# Patient Record
Sex: Female | Born: 1935
Health system: Southern US, Community
[De-identification: ages and names within clinical notes are randomized; demographics above are authoritative.]

## PROBLEM LIST (undated history)

## (undated) DIAGNOSIS — K219 Gastro-esophageal reflux disease without esophagitis: Secondary | ICD-10-CM

## (undated) DIAGNOSIS — K5730 Diverticulosis of large intestine without perforation or abscess without bleeding: Secondary | ICD-10-CM

## (undated) DIAGNOSIS — I480 Paroxysmal atrial fibrillation: Secondary | ICD-10-CM

## (undated) DIAGNOSIS — W540XXA Bitten by dog, initial encounter: Secondary | ICD-10-CM

## (undated) DIAGNOSIS — K635 Polyp of colon: Secondary | ICD-10-CM

## (undated) DIAGNOSIS — M5136 Other intervertebral disc degeneration, lumbar region: Secondary | ICD-10-CM

## (undated) DIAGNOSIS — N289 Disorder of kidney and ureter, unspecified: Secondary | ICD-10-CM

## (undated) DIAGNOSIS — K589 Irritable bowel syndrome without diarrhea: Secondary | ICD-10-CM

## (undated) DIAGNOSIS — I071 Rheumatic tricuspid insufficiency: Secondary | ICD-10-CM

## (undated) DIAGNOSIS — M51369 Other intervertebral disc degeneration, lumbar region without mention of lumbar back pain or lower extremity pain: Secondary | ICD-10-CM

## (undated) DIAGNOSIS — F419 Anxiety disorder, unspecified: Secondary | ICD-10-CM

## (undated) DIAGNOSIS — E785 Hyperlipidemia, unspecified: Secondary | ICD-10-CM

## (undated) DIAGNOSIS — M353 Polymyalgia rheumatica: Secondary | ICD-10-CM

## (undated) DIAGNOSIS — K760 Fatty (change of) liver, not elsewhere classified: Secondary | ICD-10-CM

## (undated) DIAGNOSIS — I499 Cardiac arrhythmia, unspecified: Secondary | ICD-10-CM

## (undated) DIAGNOSIS — S32000A Wedge compression fracture of unspecified lumbar vertebra, initial encounter for closed fracture: Secondary | ICD-10-CM

## (undated) DIAGNOSIS — N329 Bladder disorder, unspecified: Secondary | ICD-10-CM

## (undated) DIAGNOSIS — R55 Syncope and collapse: Secondary | ICD-10-CM

## (undated) DIAGNOSIS — K648 Other hemorrhoids: Secondary | ICD-10-CM

## (undated) DIAGNOSIS — K5909 Other constipation: Secondary | ICD-10-CM

## (undated) DIAGNOSIS — I340 Nonrheumatic mitral (valve) insufficiency: Secondary | ICD-10-CM

## (undated) DIAGNOSIS — I4811 Longstanding persistent atrial fibrillation: Secondary | ICD-10-CM

## (undated) HISTORY — DX: Other constipation: K59.09

## (undated) HISTORY — PX: TONSILLECTOMY AND ADENOIDECTOMY: SHX28

## (undated) HISTORY — DX: Polyp of colon: K63.5

## (undated) HISTORY — DX: Gastro-esophageal reflux disease without esophagitis: K21.9

## (undated) HISTORY — DX: Longstanding persistent atrial fibrillation: I48.11

## (undated) HISTORY — DX: Diverticulosis of large intestine without perforation or abscess without bleeding: K57.30

## (undated) HISTORY — PX: BLADDER SURGERY: SHX569

## (undated) HISTORY — PX: ABDOMINAL HYSTERECTOMY: SHX81

## (undated) HISTORY — DX: Bladder disorder, unspecified: N32.9

## (undated) HISTORY — DX: Wedge compression fracture of unspecified lumbar vertebra, initial encounter for closed fracture: S32.000A

## (undated) HISTORY — DX: Other intervertebral disc degeneration, lumbar region without mention of lumbar back pain or lower extremity pain: M51.369

## (undated) HISTORY — DX: Polymyalgia rheumatica: M35.3

## (undated) HISTORY — DX: Rheumatic tricuspid insufficiency: I07.1

## (undated) HISTORY — DX: Fatty (change of) liver, not elsewhere classified: K76.0

## (undated) HISTORY — DX: Irritable bowel syndrome, unspecified: K58.9

## (undated) HISTORY — DX: Hyperlipidemia, unspecified: E78.5

## (undated) HISTORY — DX: Paroxysmal atrial fibrillation: I48.0

## (undated) HISTORY — DX: Other hemorrhoids: K64.8

## (undated) HISTORY — DX: Other intervertebral disc degeneration, lumbar region: M51.36

## (undated) HISTORY — DX: Syncope and collapse: R55

## (undated) HISTORY — PX: COLONOSCOPY: SHX174

## (undated) HISTORY — DX: Nonrheumatic mitral (valve) insufficiency: I34.0

## (undated) HISTORY — DX: Bitten by dog, initial encounter: W54.0XXA

---

## 1998-03-30 ENCOUNTER — Other Ambulatory Visit: Admission: RE | Admit: 1998-03-30 | Discharge: 1998-03-30 | Payer: Self-pay | Admitting: Obstetrics and Gynecology

## 1998-10-26 ENCOUNTER — Ambulatory Visit (HOSPITAL_COMMUNITY): Admission: RE | Admit: 1998-10-26 | Discharge: 1998-10-26 | Payer: Self-pay | Admitting: Internal Medicine

## 1998-10-26 ENCOUNTER — Encounter: Payer: Self-pay | Admitting: Internal Medicine

## 1998-12-12 ENCOUNTER — Other Ambulatory Visit: Admission: RE | Admit: 1998-12-12 | Discharge: 1998-12-12 | Payer: Self-pay | Admitting: Internal Medicine

## 1998-12-12 ENCOUNTER — Encounter (INDEPENDENT_AMBULATORY_CARE_PROVIDER_SITE_OTHER): Payer: Self-pay | Admitting: Specialist

## 1999-03-22 ENCOUNTER — Encounter: Admission: RE | Admit: 1999-03-22 | Discharge: 1999-03-22 | Payer: Self-pay | Admitting: Internal Medicine

## 1999-03-22 ENCOUNTER — Encounter: Payer: Self-pay | Admitting: Internal Medicine

## 1999-11-19 ENCOUNTER — Other Ambulatory Visit: Admission: RE | Admit: 1999-11-19 | Discharge: 1999-11-19 | Payer: Self-pay | Admitting: Internal Medicine

## 2000-01-13 ENCOUNTER — Emergency Department (HOSPITAL_COMMUNITY): Admission: EM | Admit: 2000-01-13 | Discharge: 2000-01-13 | Payer: Self-pay | Admitting: Emergency Medicine

## 2000-03-31 ENCOUNTER — Encounter: Payer: Self-pay | Admitting: Internal Medicine

## 2000-03-31 ENCOUNTER — Encounter: Admission: RE | Admit: 2000-03-31 | Discharge: 2000-03-31 | Payer: Self-pay | Admitting: Internal Medicine

## 2000-04-10 ENCOUNTER — Ambulatory Visit (HOSPITAL_COMMUNITY): Admission: RE | Admit: 2000-04-10 | Discharge: 2000-04-10 | Payer: Self-pay | Admitting: Internal Medicine

## 2000-04-10 ENCOUNTER — Encounter: Payer: Self-pay | Admitting: Internal Medicine

## 2001-03-19 ENCOUNTER — Encounter: Admission: RE | Admit: 2001-03-19 | Discharge: 2001-03-19 | Payer: Self-pay | Admitting: Internal Medicine

## 2001-03-19 ENCOUNTER — Encounter: Payer: Self-pay | Admitting: Internal Medicine

## 2002-01-14 ENCOUNTER — Other Ambulatory Visit: Admission: RE | Admit: 2002-01-14 | Discharge: 2002-01-14 | Payer: Self-pay | Admitting: Internal Medicine

## 2002-03-29 ENCOUNTER — Encounter: Payer: Self-pay | Admitting: Internal Medicine

## 2002-03-29 ENCOUNTER — Ambulatory Visit (HOSPITAL_COMMUNITY): Admission: RE | Admit: 2002-03-29 | Discharge: 2002-03-29 | Payer: Self-pay | Admitting: Internal Medicine

## 2003-01-15 ENCOUNTER — Encounter: Payer: Self-pay | Admitting: Emergency Medicine

## 2003-01-15 ENCOUNTER — Emergency Department (HOSPITAL_COMMUNITY): Admission: EM | Admit: 2003-01-15 | Discharge: 2003-01-15 | Payer: Self-pay | Admitting: Emergency Medicine

## 2003-01-24 ENCOUNTER — Encounter: Admission: RE | Admit: 2003-01-24 | Discharge: 2003-01-24 | Payer: Self-pay | Admitting: Internal Medicine

## 2003-01-24 ENCOUNTER — Encounter: Payer: Self-pay | Admitting: Internal Medicine

## 2003-03-31 ENCOUNTER — Ambulatory Visit (HOSPITAL_COMMUNITY): Admission: RE | Admit: 2003-03-31 | Discharge: 2003-03-31 | Payer: Self-pay | Admitting: Internal Medicine

## 2003-06-12 ENCOUNTER — Emergency Department (HOSPITAL_COMMUNITY): Admission: EM | Admit: 2003-06-12 | Discharge: 2003-06-12 | Payer: Self-pay

## 2004-02-08 ENCOUNTER — Ambulatory Visit: Payer: Self-pay | Admitting: Internal Medicine

## 2004-05-14 ENCOUNTER — Ambulatory Visit: Payer: Self-pay | Admitting: Internal Medicine

## 2004-05-16 ENCOUNTER — Ambulatory Visit (HOSPITAL_COMMUNITY): Admission: RE | Admit: 2004-05-16 | Discharge: 2004-05-16 | Payer: Self-pay | Admitting: Internal Medicine

## 2004-08-13 ENCOUNTER — Ambulatory Visit: Payer: Self-pay | Admitting: Internal Medicine

## 2004-10-05 ENCOUNTER — Ambulatory Visit: Payer: Self-pay | Admitting: Family Medicine

## 2004-11-25 ENCOUNTER — Emergency Department (HOSPITAL_COMMUNITY): Admission: EM | Admit: 2004-11-25 | Discharge: 2004-11-25 | Payer: Self-pay | Admitting: Family Medicine

## 2004-12-28 ENCOUNTER — Ambulatory Visit: Payer: Self-pay | Admitting: Internal Medicine

## 2005-03-01 ENCOUNTER — Ambulatory Visit: Payer: Self-pay | Admitting: Internal Medicine

## 2005-03-15 ENCOUNTER — Ambulatory Visit: Payer: Self-pay | Admitting: Internal Medicine

## 2005-04-29 ENCOUNTER — Ambulatory Visit: Payer: Self-pay | Admitting: Internal Medicine

## 2005-05-21 ENCOUNTER — Ambulatory Visit (HOSPITAL_COMMUNITY): Admission: RE | Admit: 2005-05-21 | Discharge: 2005-05-21 | Payer: Self-pay | Admitting: Internal Medicine

## 2005-05-31 ENCOUNTER — Ambulatory Visit: Payer: Self-pay | Admitting: Internal Medicine

## 2005-07-16 ENCOUNTER — Ambulatory Visit: Payer: Self-pay | Admitting: Internal Medicine

## 2006-02-06 ENCOUNTER — Ambulatory Visit: Payer: Self-pay | Admitting: Internal Medicine

## 2006-02-18 ENCOUNTER — Ambulatory Visit: Payer: Self-pay | Admitting: Internal Medicine

## 2006-05-06 ENCOUNTER — Ambulatory Visit: Payer: Self-pay | Admitting: Internal Medicine

## 2006-05-26 ENCOUNTER — Ambulatory Visit (HOSPITAL_COMMUNITY): Admission: RE | Admit: 2006-05-26 | Discharge: 2006-05-26 | Payer: Self-pay | Admitting: Internal Medicine

## 2006-10-31 ENCOUNTER — Encounter: Payer: Self-pay | Admitting: Internal Medicine

## 2007-04-22 ENCOUNTER — Ambulatory Visit: Payer: Self-pay | Admitting: Family Medicine

## 2007-04-22 ENCOUNTER — Encounter: Payer: Self-pay | Admitting: Internal Medicine

## 2007-05-29 ENCOUNTER — Ambulatory Visit (HOSPITAL_COMMUNITY): Admission: RE | Admit: 2007-05-29 | Discharge: 2007-05-29 | Payer: Self-pay | Admitting: Internal Medicine

## 2007-06-05 ENCOUNTER — Ambulatory Visit: Payer: Self-pay | Admitting: Internal Medicine

## 2007-06-05 DIAGNOSIS — M810 Age-related osteoporosis without current pathological fracture: Secondary | ICD-10-CM | POA: Insufficient documentation

## 2007-06-05 DIAGNOSIS — R1084 Generalized abdominal pain: Secondary | ICD-10-CM | POA: Insufficient documentation

## 2007-06-05 DIAGNOSIS — E785 Hyperlipidemia, unspecified: Secondary | ICD-10-CM | POA: Insufficient documentation

## 2007-06-05 DIAGNOSIS — R1903 Right lower quadrant abdominal swelling, mass and lump: Secondary | ICD-10-CM | POA: Insufficient documentation

## 2007-06-05 DIAGNOSIS — K589 Irritable bowel syndrome without diarrhea: Secondary | ICD-10-CM | POA: Insufficient documentation

## 2007-06-05 DIAGNOSIS — J309 Allergic rhinitis, unspecified: Secondary | ICD-10-CM

## 2007-06-05 DIAGNOSIS — M949 Disorder of cartilage, unspecified: Secondary | ICD-10-CM

## 2007-06-05 DIAGNOSIS — M899 Disorder of bone, unspecified: Secondary | ICD-10-CM | POA: Insufficient documentation

## 2007-06-05 DIAGNOSIS — F411 Generalized anxiety disorder: Secondary | ICD-10-CM

## 2007-06-05 LAB — CONVERTED CEMR LAB: Vit D, 1,25-Dihydroxy: 37 (ref 30–89)

## 2007-06-07 DIAGNOSIS — K5730 Diverticulosis of large intestine without perforation or abscess without bleeding: Secondary | ICD-10-CM | POA: Insufficient documentation

## 2007-06-12 LAB — CONVERTED CEMR LAB
ALT: 17 units/L (ref 0–35)
AST: 20 units/L (ref 0–37)
Albumin: 3.7 g/dL (ref 3.5–5.2)
Alkaline Phosphatase: 77 units/L (ref 39–117)
BUN: 11 mg/dL (ref 6–23)
Basophils Absolute: 0.1 10*3/uL (ref 0.0–0.1)
Basophils Relative: 0.9 % (ref 0.0–1.0)
Bilirubin, Direct: 0.2 mg/dL (ref 0.0–0.3)
CO2: 30 meq/L (ref 19–32)
Calcium: 9.3 mg/dL (ref 8.4–10.5)
Chloride: 108 meq/L (ref 96–112)
Cholesterol: 223 mg/dL (ref 0–200)
Creatinine, Ser: 0.8 mg/dL (ref 0.4–1.2)
Direct LDL: 136.1 mg/dL
Eosinophils Absolute: 0.1 10*3/uL (ref 0.0–0.6)
Eosinophils Relative: 1.9 % (ref 0.0–5.0)
GFR calc Af Amer: 91 mL/min
GFR calc non Af Amer: 75 mL/min
Glucose, Bld: 73 mg/dL (ref 70–99)
HCT: 39.9 % (ref 36.0–46.0)
HDL: 48.4 mg/dL (ref >39.0)
Hemoglobin: 13.4 g/dL (ref 12.0–15.0)
Lymphocytes Relative: 36.5 % (ref 12.0–46.0)
MCHC: 33.6 g/dL (ref 30.0–36.0)
MCV: 95.3 fL (ref 78.0–100.0)
Monocytes Absolute: 0.4 10*3/uL (ref 0.2–0.7)
Monocytes Relative: 7.3 % (ref 3.0–11.0)
Neutro Abs: 3 10*3/uL (ref 1.4–7.7)
Neutrophils Relative %: 53.4 % (ref 43.0–77.0)
Platelets: 265 10*3/uL (ref 150–400)
Potassium: 4.9 meq/L (ref 3.5–5.1)
RBC: 4.19 M/uL (ref 3.87–5.11)
RDW: 12.6 % (ref 11.5–14.6)
Sodium: 142 meq/L (ref 135–145)
TSH: 4.55 microintl units/mL (ref 0.35–5.50)
Total Bilirubin: 0.8 mg/dL (ref 0.3–1.2)
Total CHOL/HDL Ratio: 4.6
Total Protein: 6.7 g/dL (ref 6.0–8.3)
Triglycerides: 118 mg/dL (ref 0–149)
VLDL: 24 mg/dL (ref 0–40)
WBC: 5.6 10*3/uL (ref 4.5–10.5)

## 2007-06-18 ENCOUNTER — Encounter: Payer: Self-pay | Admitting: Internal Medicine

## 2007-06-23 LAB — CONVERTED CEMR LAB
Calcium, Total (PTH): 9.1 mg/dL (ref 8.4–10.5)
PTH: 64.4 pg/mL (ref 14.0–72.0)

## 2007-07-29 ENCOUNTER — Telehealth: Payer: Self-pay | Admitting: Internal Medicine

## 2007-12-31 ENCOUNTER — Ambulatory Visit: Payer: Self-pay | Admitting: Internal Medicine

## 2008-04-01 HISTORY — PX: CATARACT EXTRACTION: SUR2

## 2008-06-15 ENCOUNTER — Ambulatory Visit (HOSPITAL_COMMUNITY): Admission: RE | Admit: 2008-06-15 | Discharge: 2008-06-15 | Payer: Self-pay | Admitting: Internal Medicine

## 2008-07-11 ENCOUNTER — Encounter: Payer: Self-pay | Admitting: Internal Medicine

## 2008-07-14 ENCOUNTER — Ambulatory Visit: Payer: Self-pay | Admitting: Internal Medicine

## 2008-07-14 DIAGNOSIS — J069 Acute upper respiratory infection, unspecified: Secondary | ICD-10-CM | POA: Insufficient documentation

## 2008-07-18 LAB — CONVERTED CEMR LAB
ALT: 23 units/L (ref 0–35)
AST: 29 units/L (ref 0–37)
Albumin: 4.1 g/dL (ref 3.5–5.2)
Alkaline Phosphatase: 78 units/L (ref 39–117)
BUN: 14 mg/dL (ref 6–23)
Basophils Absolute: 0.1 10*3/uL (ref 0.0–0.1)
Basophils Relative: 0.9 % (ref 0.0–3.0)
Bilirubin, Direct: 0.1 mg/dL (ref 0.0–0.3)
CO2: 26 meq/L (ref 19–32)
Calcium: 9.7 mg/dL (ref 8.4–10.5)
Chloride: 102 meq/L (ref 96–112)
Cholesterol: 222 mg/dL — ABNORMAL HIGH (ref 0–200)
Creatinine, Ser: 0.8 mg/dL (ref 0.4–1.2)
Direct LDL: 144.3 mg/dL
Eosinophils Absolute: 0.1 10*3/uL (ref 0.0–0.7)
Eosinophils Relative: 1.7 % (ref 0.0–5.0)
GFR calc non Af Amer: 74.51 mL/min (ref >60)
Glucose, Bld: 78 mg/dL (ref 70–99)
HCT: 40.1 % (ref 36.0–46.0)
HDL: 47 mg/dL (ref >39.00)
Hemoglobin: 13.8 g/dL (ref 12.0–15.0)
Lymphocytes Relative: 36.8 % (ref 12.0–46.0)
Lymphs Abs: 2.6 10*3/uL (ref 0.7–4.0)
MCHC: 34.4 g/dL (ref 30.0–36.0)
MCV: 94.4 fL (ref 78.0–100.0)
Monocytes Absolute: 0.4 10*3/uL (ref 0.1–1.0)
Monocytes Relative: 5.9 % (ref 3.0–12.0)
Neutro Abs: 3.9 10*3/uL (ref 1.4–7.7)
Neutrophils Relative %: 54.7 % (ref 43.0–77.0)
Platelets: 233 10*3/uL (ref 150.0–400.0)
Potassium: 3.8 meq/L (ref 3.5–5.1)
RBC: 4.25 M/uL (ref 3.87–5.11)
RDW: 12 % (ref 11.5–14.6)
Sodium: 137 meq/L (ref 135–145)
TSH: 3.04 microintl units/mL (ref 0.35–5.50)
Total Bilirubin: 0.9 mg/dL (ref 0.3–1.2)
Total CHOL/HDL Ratio: 5
Total Protein: 7.6 g/dL (ref 6.0–8.3)
Triglycerides: 156 mg/dL — ABNORMAL HIGH (ref 0.0–149.0)
VLDL: 31.2 mg/dL (ref 0.0–40.0)
WBC: 7.1 10*3/uL (ref 4.5–10.5)

## 2008-08-12 ENCOUNTER — Ambulatory Visit: Payer: Self-pay | Admitting: Internal Medicine

## 2008-08-25 ENCOUNTER — Encounter: Admission: RE | Admit: 2008-08-25 | Discharge: 2008-09-28 | Payer: Self-pay | Admitting: Orthopedic Surgery

## 2008-12-12 ENCOUNTER — Encounter (INDEPENDENT_AMBULATORY_CARE_PROVIDER_SITE_OTHER): Payer: Self-pay | Admitting: *Deleted

## 2009-06-16 ENCOUNTER — Ambulatory Visit: Payer: Self-pay | Admitting: Internal Medicine

## 2009-06-16 DIAGNOSIS — R1013 Epigastric pain: Secondary | ICD-10-CM | POA: Insufficient documentation

## 2009-06-21 ENCOUNTER — Encounter: Payer: Self-pay | Admitting: Internal Medicine

## 2009-06-21 ENCOUNTER — Ambulatory Visit: Payer: Self-pay | Admitting: Internal Medicine

## 2009-06-26 ENCOUNTER — Encounter (INDEPENDENT_AMBULATORY_CARE_PROVIDER_SITE_OTHER): Payer: Self-pay | Admitting: *Deleted

## 2009-06-30 ENCOUNTER — Ambulatory Visit (HOSPITAL_COMMUNITY): Admission: RE | Admit: 2009-06-30 | Discharge: 2009-06-30 | Payer: Self-pay | Admitting: Internal Medicine

## 2009-07-11 ENCOUNTER — Ambulatory Visit: Payer: Self-pay | Admitting: Internal Medicine

## 2009-08-01 ENCOUNTER — Ambulatory Visit: Payer: Self-pay | Admitting: Internal Medicine

## 2009-08-01 DIAGNOSIS — R109 Unspecified abdominal pain: Secondary | ICD-10-CM

## 2009-08-01 DIAGNOSIS — N390 Urinary tract infection, site not specified: Secondary | ICD-10-CM

## 2009-08-01 LAB — CONVERTED CEMR LAB
Bilirubin Urine: NEGATIVE
Glucose, Urine, Semiquant: NEGATIVE
Ketones, urine, test strip: NEGATIVE
Nitrite: NEGATIVE
Protein, U semiquant: NEGATIVE
Specific Gravity, Urine: <1.005
Urobilinogen, UA: 0.2
pH: 6

## 2009-08-02 ENCOUNTER — Encounter: Payer: Self-pay | Admitting: Internal Medicine

## 2009-08-02 ENCOUNTER — Telehealth: Payer: Self-pay | Admitting: Internal Medicine

## 2009-08-03 ENCOUNTER — Encounter: Payer: Self-pay | Admitting: Internal Medicine

## 2009-08-08 ENCOUNTER — Encounter (INDEPENDENT_AMBULATORY_CARE_PROVIDER_SITE_OTHER): Payer: Self-pay | Admitting: *Deleted

## 2009-08-10 ENCOUNTER — Ambulatory Visit: Payer: Self-pay | Admitting: Internal Medicine

## 2009-08-10 LAB — CONVERTED CEMR LAB
Calcium, Total (PTH): 9.1 mg/dL (ref 8.4–10.5)
PTH: 49.2 pg/mL (ref 14.0–72.0)

## 2009-08-11 ENCOUNTER — Telehealth: Payer: Self-pay | Admitting: Internal Medicine

## 2009-08-15 LAB — CONVERTED CEMR LAB
ALT: 23 units/L (ref 0–35)
AST: 26 units/L (ref 0–37)
Albumin: 3.9 g/dL (ref 3.5–5.2)
Alkaline Phosphatase: 65 units/L (ref 39–117)
BUN: 13 mg/dL (ref 6–23)
Basophils Absolute: 0 10*3/uL (ref 0.0–0.1)
Basophils Relative: 1 % (ref 0.0–3.0)
Bilirubin, Direct: 0.1 mg/dL (ref 0.0–0.3)
CO2: 28 meq/L (ref 19–32)
Calcium: 9.2 mg/dL (ref 8.4–10.5)
Chloride: 104 meq/L (ref 96–112)
Cholesterol: 187 mg/dL (ref 0–200)
Creatinine, Ser: 0.8 mg/dL (ref 0.4–1.2)
Direct LDL: 103.5 mg/dL
Eosinophils Absolute: 0.1 10*3/uL (ref 0.0–0.7)
Eosinophils Relative: 2.8 % (ref 0.0–5.0)
GFR calc non Af Amer: 74.3 mL/min (ref >60)
Glucose, Bld: 80 mg/dL (ref 70–99)
HCT: 35.8 % — ABNORMAL LOW (ref 36.0–46.0)
HDL: 44.4 mg/dL (ref >39.00)
Hemoglobin: 12.4 g/dL (ref 12.0–15.0)
Lymphocytes Relative: 42.4 % (ref 12.0–46.0)
Lymphs Abs: 2.2 10*3/uL (ref 0.7–4.0)
MCHC: 34.5 g/dL (ref 30.0–36.0)
MCV: 92.4 fL (ref 78.0–100.0)
Monocytes Absolute: 0.4 10*3/uL (ref 0.1–1.0)
Monocytes Relative: 7.4 % (ref 3.0–12.0)
Neutro Abs: 2.4 10*3/uL (ref 1.4–7.7)
Neutrophils Relative %: 46.4 % (ref 43.0–77.0)
Platelets: 268 10*3/uL (ref 150.0–400.0)
Potassium: 5.4 meq/L — ABNORMAL HIGH (ref 3.5–5.1)
RBC: 3.88 M/uL (ref 3.87–5.11)
RDW: 14 % (ref 11.5–14.6)
Sodium: 139 meq/L (ref 135–145)
TSH: 7.04 microintl units/mL — ABNORMAL HIGH (ref 0.35–5.50)
Total Bilirubin: 0.6 mg/dL (ref 0.3–1.2)
Total CHOL/HDL Ratio: 4
Total Protein: 6.7 g/dL (ref 6.0–8.3)
Triglycerides: 218 mg/dL — ABNORMAL HIGH (ref 0.0–149.0)
VLDL: 43.6 mg/dL — ABNORMAL HIGH (ref 0.0–40.0)
WBC: 5.1 10*3/uL (ref 4.5–10.5)

## 2009-08-17 ENCOUNTER — Encounter: Payer: Self-pay | Admitting: Internal Medicine

## 2009-08-24 ENCOUNTER — Ambulatory Visit: Payer: Self-pay | Admitting: Internal Medicine

## 2009-08-24 LAB — HM COLONOSCOPY

## 2009-08-29 ENCOUNTER — Ambulatory Visit: Payer: Self-pay | Admitting: Internal Medicine

## 2009-08-29 LAB — CONVERTED CEMR LAB
Free T4: 0.8 ng/dL (ref 0.6–1.6)
Potassium: 4.1 meq/L (ref 3.5–5.1)
T3, Free: 2.9 pg/mL (ref 2.3–4.2)
TSH: 4.39 microintl units/mL (ref 0.35–5.50)
Thyroperoxidase Ab SerPl-aCnc: 44.4 (ref 0.0–60.0)

## 2009-09-05 ENCOUNTER — Ambulatory Visit: Payer: Self-pay | Admitting: Internal Medicine

## 2009-09-05 DIAGNOSIS — R21 Rash and other nonspecific skin eruption: Secondary | ICD-10-CM | POA: Insufficient documentation

## 2009-11-23 ENCOUNTER — Encounter: Payer: Self-pay | Admitting: Internal Medicine

## 2010-01-04 ENCOUNTER — Encounter: Payer: Self-pay | Admitting: Internal Medicine

## 2010-01-08 ENCOUNTER — Inpatient Hospital Stay (HOSPITAL_COMMUNITY): Admission: EM | Admit: 2010-01-08 | Discharge: 2010-01-10 | Payer: Self-pay | Admitting: Emergency Medicine

## 2010-01-09 ENCOUNTER — Encounter: Payer: Self-pay | Admitting: Internal Medicine

## 2010-01-10 ENCOUNTER — Encounter: Payer: Self-pay | Admitting: Internal Medicine

## 2010-01-15 ENCOUNTER — Ambulatory Visit: Payer: Self-pay | Admitting: Internal Medicine

## 2010-01-15 DIAGNOSIS — K5909 Other constipation: Secondary | ICD-10-CM | POA: Insufficient documentation

## 2010-01-15 DIAGNOSIS — E871 Hypo-osmolality and hyponatremia: Secondary | ICD-10-CM

## 2010-01-15 DIAGNOSIS — R399 Unspecified symptoms and signs involving the genitourinary system: Secondary | ICD-10-CM

## 2010-01-15 DIAGNOSIS — K5902 Outlet dysfunction constipation: Secondary | ICD-10-CM | POA: Insufficient documentation

## 2010-01-17 ENCOUNTER — Encounter: Payer: Self-pay | Admitting: Internal Medicine

## 2010-01-18 LAB — CONVERTED CEMR LAB
BUN: 9 mg/dL (ref 6–23)
CO2: 24 meq/L (ref 19–32)
Calcium: 9.4 mg/dL (ref 8.4–10.5)
Chloride: 99 meq/L (ref 96–112)
Creatinine, Ser: 0.7 mg/dL (ref 0.4–1.2)
GFR calc non Af Amer: 81.2 mL/min (ref >60)
Glucose, Bld: 73 mg/dL (ref 70–99)
Potassium: 5.1 meq/L (ref 3.5–5.1)
Sodium: 134 meq/L — ABNORMAL LOW (ref 135–145)
TSH: 4.93 microintl units/mL (ref 0.35–5.50)

## 2010-01-26 ENCOUNTER — Telehealth: Payer: Self-pay | Admitting: *Deleted

## 2010-02-09 ENCOUNTER — Encounter: Payer: Self-pay | Admitting: Internal Medicine

## 2010-04-11 ENCOUNTER — Encounter: Payer: Self-pay | Admitting: Internal Medicine

## 2010-05-01 NOTE — Miscellaneous (Signed)
Summary: BONE DENSITY  Clinical Lists Changes  Orders: Added new Test order of T-Bone Densitometry [phone]) - Signed Added new Test order of T-Lumbar Vertebral Assessment [phone]) - Signed

## 2010-05-01 NOTE — Consult Note (Signed)
Summary: [facility]  [facility]   Imported By: Laural Benes 08/14/2009 14:49:10  _____________________________________________________________________  External Attachment:    Type:   Image     Comment:   External Document

## 2010-05-01 NOTE — Letter (Signed)
Summary: [facility]  [facility]   Imported By: Laural Benes 02/19/2010 11:29:33  _____________________________________________________________________  External Attachment:    Type:   Image     Comment:   External Document

## 2010-05-01 NOTE — Assessment & Plan Note (Signed)
Summary: follow up/ssc   Vital Signs:  Patient profile:   77 year old female Menstrual status:  hysterectomy Weight:      164 pounds Pulse rate:   80 / minute BP sitting:   130 / 80  (left arm) Cuff size:   regular  Vitals Entered By: Sherron Monday, CMA (AAMA) (September 05, 2009 1:35 PM) CC: Follow-up visit on labs- Pt needs a place on left side of neck check. It is itching.    History of Present Illness: Ashley Lawson comes in today  for follow up of labs and problem urinating and abd pain and consitpation.  ALso to discuss bone health.  She did see urology and no other dx and her urin problem is better today.   Has itchy place left neck Hard to see asks opinion.  Was on bisphosphonates a while back   she cant remember why she had to stop  but thinks it was a se.   Preventive Screening-Counseling & Management  Alcohol-Tobacco     Alcohol drinks/day: <1     Alcohol type: wine     Smoking Status: never     Passive Smoke Exposure: no  Caffeine-Diet-Exercise     Caffeine use/day: 0     Does Patient Exercise: yes     Type of exercise: walking and pool in summer     Times/week: 3  Current Medications (verified): 1)  Alprazolam 0.25 Mg Tabs (Alprazolam) .... Take 1 Tablet Once Daily As Needed 2)  Fish Oil 500 Mg Caps (Omega-3 Fatty Acids) .... Take 3)  Calcium 1200-1000 Mg-Unit Chew (Calcium Carbonate-Vit D-Min) 4)  Super Softget Multi 5)  Ranitidine Hcl 150 Mg Tabs (Ranitidine Hcl) .Marland Kitchen.. 1 By Mouth Two Times A Day As Directed  Allergies (verified): 1)  ! Fosamax 2)  ! * Achromycin 3)  Amoxicillin (Amoxicillin)  Past History:  Past medical, surgical, family and social histories (including risk factors) reviewed, and no changes noted (except as noted below).  Past Medical History: Reviewed history from 07/11/2009 and no changes required. Allergic rhinitis Hyperlipidemia Osteoporosis dexa 2011  -2.7 hip nl spine  IBS  constipation predominant Diverticulosis,  colon  Past Surgical History: Reviewed history from 06/16/2009 and no changes required. Hysterectomy Cataract extraction   2010. both   Past History:  Care Management: Allergy: Dr. Velora Heckler  or whelan  GI  Elie Confer: Mccuwen  Family History: Reviewed history from 06/05/2007 and no changes required. sister with fx hip and mom hx fx hip.   Social History: Reviewed history from 07/14/2008 and no changes required. Retired Never Smoked Alcohol use-yes Regular exercise-yes widowed     at home with chidlren and GKs  hhof  2   puppy     Physical Exam  General:  alert, well-developed, and well-nourished.   Skin:  left neck with patch of redness like contact derm no viscle or suspicious lesions Cervical Nodes:  No lymphadenopathy noted Psych:  Oriented X3, good eye contact, not depressed appearing, and slightly anxious.   reviewed  data  and labs nl thyroid and PTH  Impression & Recommendations:  Problem # 1:  OSTEOPOROSIS (ICD-733.00) counseled at length  . Believe she had se of the bisphoshponates oraly with her GI  problems  and gerd symptom .  she is hestant to have meds that could give se understandibally but does have risk by her dexa and age .  she is a candidate for reclast.  Her updated medication list for this  problem includes:    Calcium 1200-1000 Mg-unit Chew (Calcium carbonate-vit d-min)  Problem # 2:  RASH (ICD-782.1) seems like contact and not al;arming ... disc rx for this  Problem # 3:  SUPRAPUBIC PAIN (ICD-789.09) Assessment: Improved eval by uro quiescent currently  Complete Medication List: 1)  Alprazolam 0.25 Mg Tabs (Alprazolam) .... Take 1 tablet once daily as needed 2)  Fish Oil 500 Mg Caps (Omega-3 fatty acids) .... Take 3)  Calcium 1200-1000 Mg-unit Chew (Calcium carbonate-vit d-min) 4)  Super Softget Multi  5)  Ranitidine Hcl 150 Mg Tabs (Ranitidine hcl) .Marland Kitchen.. 1 by mouth two times a day as directed  Patient Instructions: 1)  consider  reclast    infusion for your osteoporosis to have less change of stomach problems  with medications. 2)  Should get   TSHs in 6 months  it was normal this time .  3)  HCS cream to rash two times a day .   greater than 50% of visit spent in counseling   25 minutes

## 2010-05-01 NOTE — Letter (Signed)
Summary: [facility] Instructions  [facility]  [place], [place] [address]   Phone: [phone]  Fax: [phone]       Ashley Lawson    Nov 25, 1947    MRN: [id number]       Procedure Day Ashley Lawson:  Ashley Lawson  08/24/09     Arrival Time:  8:30am     Procedure Time:  9:30am     Location of Procedure:                    [facility]  [name] (4th Floor)    [name]  Starting 5 days prior to your procedure  SATURDAY 05/21  do not eat nuts, seeds, popcorn, corn, beans, peas,  salads, or any raw vegetables.  Do not take any fiber supplements (e.g. Metamucil, Citrucel, and Benefiber). ____________________________________________________________________________________________________   THE DAY BEFORE YOUR PROCEDURE         DATE: [facility]  05/25  1   Drink clear liquids the entire day-NO SOLID FOOD  2   Do not drink anything colored red or purple.  Avoid juices with pulp.  No orange juice.  3   Drink at least 64 oz. (8 glasses) of fluid/clear liquids during the day to prevent dehydration and help the prep work efficiently.  CLEAR LIQUIDS INCLUDE: Water Jello Ice Popsicles Tea (sugar ok, no milk/cream) Powdered fruit flavored drinks Coffee (sugar ok, no milk/cream) Gatorade Juice: apple, white grape, white cranberry  Lemonade Clear bullion, consomm, broth Carbonated beverages (any kind) Strained chicken noodle soup Hard Candy  4   Mix the entire bottle of Miralax with 64 oz. of Gatorade/Powerade in the morning and put in the refrigerator to chill.  5   At 3:00 pm take 2 Dulcolax/Bisacodyl tablets.  6   At 4:30 pm take one Reglan/Metoclopramide tablet.  7  Starting at 5:00 pm drink one 8 oz glass of the Miralax mixture every 15-20 minutes until you have finished drinking the entire 64 oz.  You should finish drinking prep around 7:30 or 8:00 pm.  8   If you are nauseated, you may take the 2nd  Reglan/Metoclopramide tablet at 6:30 pm.        9    At 8:00 pm take 2 more DULCOLAX/Bisacodyl tablets.     THE DAY OF YOUR PROCEDURE      DATE:  Ashley Lawson  05/26  You may drink clear liquids until  7:30am   (2 HOURS BEFORE PROCEDURE).   MEDICATION INSTRUCTIONS  Unless otherwise instructed, you should take regular prescription medications with a small sip of water as early as possible the morning of your procedure.           OTHER INSTRUCTIONS  You will need a responsible adult at least 75 years of age to accompany you and drive you home.   This person must remain in the waiting room during your procedure.  Wear loose fitting clothing that is easily removed.  Leave jewelry and other valuables at home.  However, you may wish to bring a book to read or an iPod/MP3 player to listen to music as you wait for your procedure to start.  Remove all body piercing jewelry and leave at home.  Total time from sign-in until discharge is approximately 2-3 hours.  You should go home directly after your procedure and rest.  You can resume normal activities the day after your procedure.  The day of your procedure you  should not:   Drive   Make legal decisions   Operate machinery   Drink alcohol   Return to work  You will receive specific instructions about eating, activities and medications before you leave.   The above instructions have been reviewed and explained to me by   Ashley Dash RN  Aug 10, 2009 11:20 AM     I fully understand and can verbalize these instructions _____________________________ Date _______

## 2010-05-01 NOTE — Letter (Signed)
Summary: Previsit letter  [facility]  [place], [place] [address]   Phone: [phone]  Fax: [phone]       06/26/2009 MRN: [id number]  [facility] [place], [place]  [address]  Dear Ms. Sacra,  Welcome to the Gastroenterology Division at [facility].    You are scheduled to see a nurse for your pre-procedure visit on 08-10-09 at 11:00a.m. on the 3rd floor at [facility] [employer].  We ask that you try to arrive at our office 15 minutes prior to your appointment time to allow for check-in.  Your nurse visit will consist of discussing your medical and surgical history, your immediate family medical history, and your medications.    Please bring a complete list of all your medications or, if you prefer, bring the medication bottles and we will list them.  We will need to be aware of both prescribed and over the counter drugs.  We will need to know exact dosage information as well.  If you are on blood thinners (Coumadin, Plavix, Aggrenox, Ticlid, etc.) please call our office today/prior to your appointment, as we need to consult with your physician about holding your medication.   Please be prepared to read and sign documents such as consent forms, a financial agreement, and acknowledgement forms.  If necessary, and with your consent, a friend or relative is welcome to sit-in on the nurse visit with you.  Please bring your insurance card so that we may make a copy of it.  If your insurance requires a referral to see a specialist, please bring your referral form from your primary care physician.  No co-pay is required for this nurse visit.     If you cannot keep your appointment, please call [phone] to cancel or reschedule prior to your appointment date.  This allows [place] the opportunity to schedule an appointment for another patient in need of care.    Thank you for choosing [facility] for your medical  needs.  We appreciate the opportunity to care for you.  Please visit [place] at our website  to learn more about our practice.                     Sincerely.                                                                                                                   The Gastroenterology Division

## 2010-05-01 NOTE — Progress Notes (Signed)
Summary: ? UTI ?  Phone Note Call from Patient   Caller: Patient Call For: Burnis Medin MD Summary of Call: Pt is having ? symptoms of a UTI.  Wants to wait and go to the Saturday clinic tomorrow if any worse.  Her symptoms are very mild and are described as a sensitivity when urinating.  She  sees an Dealer, and has appt Thursday for routine follow up. Initial call taken by: Deanna Artis CMA,  Aug 11, 2009 2:52 PM  Follow-up for Phone Call        satuday clinic is appropiate. Follow-up by: Burnis Medin MD,  Aug 11, 2009 3:38 PM

## 2010-05-01 NOTE — Letter (Signed)
Summary: [facility]  [facility]   Imported By: Laural Benes 01/11/2010 10:04:20  _____________________________________________________________________  External Attachment:    Type:   Image     Comment:   External Document

## 2010-05-01 NOTE — Assessment & Plan Note (Signed)
Summary: GASTROINTESTINAL CONCERNS // RS   Vital Signs:  Patient profile:   77 year old female Menstrual status:  hysterectomy Weight:      165 pounds Temp:     98 degrees F Pulse rate:   97 / minute Resp:     12 per minute BP sitting:   116 / 64  Vitals Entered By: Deanna Artis CMA (June 16, 2009 2:25 PM) CC: epigastric pain at times Is Patient Diabetic? No Pain Assessment Patient in pain? no        History of Present Illness: Ashley Lawson comes in today for   above. last office visit was last year  MAY  Since last visit  here  there have been no major changes in health status  .   However she is having increasing GI problems recently episodic  but generally feels well otherwise.  GI signs seem to be the same as in the past.     ? from onions recently and gets  epigastric discomfortt and feels like going to come up.     worse recently . No vomiting.  But ? regurgitation.     Currently not taking anything for this . In the past has been on h2 blocker.   "Bowels  are doing better " with strawberries and fiber and prune juice less constipation. IBS stable and no divertic flares.  Anxiety  : not taking much alprazalam but almost out.  Bone health : no  new fractures     Preventive Screening-Counseling & Management  Alcohol-Tobacco     Alcohol drinks/day: <1     Alcohol type: wine     Smoking Status: never     Passive Smoke Exposure: no  Caffeine-Diet-Exercise     Caffeine use/day: 0     Does Patient Exercise: yes     Type of exercise: walking and pool in summer     Times/week: 3  Allergies: 1)  ! Fosamax 2)  Amoxicillin (Amoxicillin)  Past History:  Past medical, surgical, family and social histories (including risk factors) reviewed, and no changes noted (except as noted below).  Past Medical History: Allergic rhinitis Hyperlipidemia Osteoporosis IBS  constipation predominant Diverticulosis, colon  Past Surgical History: Hysterectomy Cataract  extraction   2010. both   Past History:  Care Management: Allergy: Dr. Velora Heckler  or whelan  GI  Elie Confer: Mccuwen  Family History: Reviewed history from 06/05/2007 and no changes required. sister with fx hip and mom hx fx hip.   Social History: Reviewed history from 07/14/2008 and no changes required. Retired Never Smoked Alcohol use-yes Regular exercise-yes widowed     at home with chidlren and GKs  hhof  2   puppy     Review of Systems  The patient denies anorexia, fever, weight loss, vision loss, decreased hearing, hoarseness, chest pain, syncope, dyspnea on exertion, peripheral edema, prolonged cough, headaches, hemoptysis, melena, hematochezia, hematuria, transient blindness, difficulty walking, depression, abnormal bleeding, enlarged lymph nodes, angioedema, and breast masses.    Physical Exam  General:  alert, well-developed, well-nourished, and well-hydrated.   Head:  normocephalic and atraumatic.   Eyes:  vision grossly intact.   Nose:  no external deformity, no external erythema, and no nasal discharge.   Mouth:  pharynx pink and moist.   Neck:  No deformities, masses, or tenderness noted.no bruits heard Lungs:  Normal respiratory effort, chest expands symmetrically. Lungs are clear to auscultation, no crackles or wheezes.no dullness.    Heart:  Normal rate  and regular rhythm. S1 and S2 normal without gallop, murmur, click, rub or other extra sounds.no lifts.   Abdomen:  soft, normal bowel sounds, no distention, no masses, no guarding, no rigidity, no rebound tenderness, no hepatomegaly, and no splenomegaly.  tender around mid epigastrum  no masses  Msk:  OA changes hands no acute swelling Pulses:  pulses intact without delay   Neurologic:  alert & oriented X3, strength normal in all extremities, and gait normal.   Skin:  turgor normal and color normal.  senile changes  Cervical Nodes:  No lymphadenopathy noted Psych:  Oriented X3, normally interactive, good  eye contact, and not depressed appearing.  minimally anxious  today   Impression & Recommendations:  Problem # 1:  ABDOMINAL PAIN, EPIGASTRIC (ICD-789.06)  with hx of same and what sounds like regurgitation  ? not vomiting    .  she related this to her eating certain foods and ok inbetween  and since did well on ranititdine in the past will restart.  and follow up if not getting better   due for labs  also that can be schedules  and follow up with GI if needed  Orders: Prescription Created Electronically [phone])  Problem # 2:  ANXIETY STATE, UNSPECIFIED (ICD-300.00) Assessment: Improved refill med to use as needed 1/2 or so Her updated medication list for this problem includes:    Alprazolam 0.25 Mg Tabs (Alprazolam) .Marland Kitchen... Take 1 tablet once daily as needed  Problem # 3:  OSTEOPOROSIS (ICD-733.00) no fracture. ( except old ankle fx)   ( coulnt take fosamax)  and will review record   lasat dexa 2009  hip -2.7   and had rx to fosamax ( GI)   want interested after that for further meds .   will readdress at future visit  and could repeat DEXa this year  Her updated medication list for this problem includes:    Calcium 1200-1000 Mg-unit Chew (Calcium carbonate-vit d-min)  Problem # 4:  HYPERLIPIDEMIA (ICD-272.4) recheck labs   Complete Medication List: 1)  Alprazolam 0.25 Mg Tabs (Alprazolam) .... Take 1 tablet once daily as needed 2)  Fish Oil 500 Mg Caps (Omega-3 fatty acids) .... Take 3)  Calcium 1200-1000 Mg-unit Chew (Calcium carbonate-vit d-min) 4)  Super Softget Multi  5)  Restasis 0.05 % Emul (Cyclosporine) 6)  Ranitidine Hcl 150 Mg Tabs (Ranitidine hcl) .Marland Kitchen.. 1 by mouth two times a day as directed  Patient Instructions: 1)  begin acid blocker  for at least   3-4 weeks   2)  then     can wean off after this.    3)  avoid further weight gain and 5- 10 pound weight loss will help also. 4)  In one month   5)  Lab fasting lipid lfts cbcdiff  bmp tsh  dx abd pain and elevated  lipids  6)  then follow up as appropriate.  7)  to get colonoscopy in  this year  per Dr Olevia Perches. Prescriptions: ALPRAZOLAM 0.25 MG TABS (ALPRAZOLAM) Take 1 tablet once daily as needed  #30 x 1   Entered and Authorized by:   Burnis Medin MD   Signed by:   Burnis Medin MD on 06/16/2009   Method used:   Print then Give to Patient   RxID:   [id number] RANITIDINE HCL 150 MG TABS (RANITIDINE HCL) 1 by mouth two times a day as directed  #60 x 3   Entered and Authorized by:   Mariann Laster  Darnelle Going MD   Signed by:   Burnis Medin MD on 06/16/2009   Method used:   Electronically to        [id number] (retail)       [address]       [place], [place]  [address]       Ph: [phone] or [phone]       Fax: [phone]   RxID:   [id number]   Appended Document: GASTROINTESTINAL CONCERNS // RS Tell patient that I reviewed her record and she is due for the DEXA  scan. Please arrange for her to get this done.  Appended Document: GASTROINTESTINAL CONCERNS // RS LMTOCB  Appended Document: GASTROINTESTINAL CONCERNS // RS Pt aware of results and dexa scheduled.

## 2010-05-01 NOTE — Miscellaneous (Signed)
Summary: [facility]  Clinical Lists Changes  Medications: Added new medication of MIRALAX   POWD (POLYETHYLENE GLYCOL 3350) As per prep  instructions. - Signed Added new medication of DULCOLAX 5 MG  TBEC (BISACODYL) Day before procedure take 2 at 3pm and 2 at 8pm. - Signed Added new medication of REGLAN 10 MG  TABS (METOCLOPRAMIDE HCL) As per prep instructions. - Signed Rx of MIRALAX   POWD (POLYETHYLENE GLYCOL 3350) As per prep  instructions.;  #255gm x 0;  Signed;  Entered by: [name] RN;  Authorized by: Lafayette Dragon MD;  Method used: Electronically to [id number] [address], [place], [place]  [address], Ph: [phone] or [phone], Fax: [phone] Rx of DULCOLAX 5 MG  TBEC (BISACODYL) Day before procedure take 2 at 3pm and 2 at 8pm.;  #4 x 0;  Signed;  Entered by: [name] RN;  Authorized by: Lafayette Dragon MD;  Method used: Electronically to [id number] [address] [place], [place]  [address], Ph: [phone] or [phone], Fax: [phone] Rx of REGLAN 10 MG  TABS (METOCLOPRAMIDE HCL) As per prep instructions.;  #2 x 0;  Signed;  Entered by: [name] RN;  Authorized by: Lafayette Dragon MD;  Method used: Electronically to [id number] [address] [place], [place]  [address], Ph: [phone] or [phone], Fax: [phone] Allergies: Added new allergy or adverse reaction of * ACHROMYCIN    Prescriptions: REGLAN 10 MG  TABS (METOCLOPRAMIDE HCL) As per prep instructions.  #2 x 0   Entered by:   [name] RN   Authorized by:   Lafayette Dragon MD   Signed by:   [name] RN on 08/10/2009   Method used:   Electronically to        [id number] (retail)       [address]       [place], [place]  [address]       Ph: [phone] or [phone]       Fax: [phone]   RxID:   [id number] DULCOLAX 5 MG  TBEC (BISACODYL) Day before procedure take 2 at 3pm and 2 at 8pm.  #4 x 0   Entered by:   [name] RN   Authorized by:   Lafayette Dragon MD  Signed by:   [name] RN on 08/10/2009   Method used:   Electronically to        [id number] (retail)       [address]       [place], [place]  [address]       Ph: [phone] or [phone]       Fax: [phone]   RxID:   [id number] MIRALAX   POWD (POLYETHYLENE GLYCOL 3350) As per prep  instructions.  #255gm x 0   Entered by:   [name] RN   Authorized by:   Lafayette Dragon MD   Signed by:   [name] RN on 08/10/2009   Method used:   Electronically to        [id number] (retail)       [address]       [place], [place]  [address]       Ph: [phone] or [phone]       Fax: [phone]   RxID:   [id number]

## 2010-05-01 NOTE — Letter (Signed)
Summary: [facility]  [facility]   Imported By: Laural Benes 01/24/2010 11:27:40  _____________________________________________________________________  External Attachment:    Type:   Image     Comment:   External Document

## 2010-05-01 NOTE — Letter (Signed)
Summary: [facility]  [facility]   Imported By: Laural Benes 01/24/2010 10:21:43  _____________________________________________________________________  External Attachment:    Type:   Image     Comment:   External Document

## 2010-05-01 NOTE — Letter (Signed)
Summary: [facility]  [facility]   Imported By: Laural Benes 08/25/2009 15:24:55  _____________________________________________________________________  External Attachment:    Type:   Image     Comment:   External Document

## 2010-05-01 NOTE — Assessment & Plan Note (Signed)
Summary: ?uti/njr   Vital Signs:  Patient profile:   77 year old female Menstrual status:  hysterectomy Weight:      164 pounds Temp:     98.4 degrees F oral Pulse rate:   78 / minute BP sitting:   130 / 80  (right arm) Cuff size:   regular  Vitals Entered By: Sherron Monday, CMA (AAMA) (Aug 01, 2009 3:37 PM) CC: Lower abd. pressure, no burning upon urination, pt has to push hard to get urine out. This started today. Some urinary frequency the other night.   History of Present Illness: Ashley Lawson   has suprapubic pain.    dysuria  but pressure over bladder.     hard to empty. bladder for a day.off and on signs in the past.  NO fever  change in bowel habits.  or flank pain.  ? if a UTI  feels like this.  No meds for this .  Remote hx for urology consults.  Preventive Screening-Counseling & Management  Alcohol-Tobacco     Alcohol drinks/day: <1     Alcohol type: wine     Smoking Status: never     Passive Smoke Exposure: no  Caffeine-Diet-Exercise     Caffeine use/day: 0     Does Patient Exercise: yes     Type of exercise: walking and pool in summer     Times/week: 3  Current Medications (verified): 1)  Alprazolam 0.25 Mg Tabs (Alprazolam) .... Take 1 Tablet Once Daily As Needed 2)  Fish Oil 500 Mg Caps (Omega-3 Fatty Acids) .... Take 3)  Calcium 1200-1000 Mg-Unit Chew (Calcium Carbonate-Vit D-Min) 4)  Super Softget Multi 5)  Ranitidine Hcl 150 Mg Tabs (Ranitidine Hcl) .Marland Kitchen.. 1 By Mouth Two Times A Day As Directed  Allergies (verified): 1)  ! Fosamax 2)  Amoxicillin (Amoxicillin)  Past History:  Past medical, surgical, family and social histories (including risk factors) reviewed for relevance to current acute and chronic problems.  Past Medical History: Reviewed history from 07/11/2009 and no changes required. Allergic rhinitis Hyperlipidemia Osteoporosis dexa 2011  -2.7 hip nl spine  IBS  constipation predominant Diverticulosis, colon  Past Surgical  History: Reviewed history from 06/16/2009 and no changes required. Hysterectomy Cataract extraction   2010. both   Past History:  Care Management: Allergy: Dr. Velora Heckler  or whelan  GI  Elie Confer: Mccuwen  Family History: Reviewed history from 06/05/2007 and no changes required. sister with fx hip and mom hx fx hip.   Social History: Reviewed history from 07/14/2008 and no changes required. Retired Never Smoked Alcohol use-yes Regular exercise-yes widowed     at home with chidlren and GKs  hhof  2   puppy     Review of Systems  The patient denies anorexia, fever, weight loss, weight gain, vision loss, melena, hematochezia, severe indigestion/heartburn, hematuria, and genital sores.    Physical Exam  General:  Well-developed,well-nourished,in no acute distress; alert,appropriate and cooperative throughout examination Lungs:  normal respiratory effort, no intercostal retractions, and no accessory muscle use.   Heart:  normal rate and regular rhythm.   Abdomen:  soft, no distention, no masses, no guarding, no rebound tenderness, no hepatomegaly, and no splenomegaly.  tender lower abd suprapubic without G or R   no flank pain Pulses:  pulses intact without delay   Psych:  Oriented X3, normally interactive, good eye contact, and not depressed appearing.     Impression & Recommendations:  Problem # 1:  ? UTI  culture and rx empirically  and follow up  some atypical signs  ? bladder dysfunction   will get uro check if persistent or  progressive    Problem # 2:  SUPRAPUBIC PAIN (ICD-789.09) see above   Complete Medication List: 1)  Alprazolam 0.25 Mg Tabs (Alprazolam) .... Take 1 tablet once daily as needed 2)  Fish Oil 500 Mg Caps (Omega-3 fatty acids) .... Take 3)  Calcium 1200-1000 Mg-unit Chew (Calcium carbonate-vit d-min) 4)  Super Softget Multi  5)  Ranitidine Hcl 150 Mg Tabs (Ranitidine hcl) .Marland Kitchen.. 1 by mouth two times a day as directed 6)  Sulfamethoxazole-tmp  Ds 800-160 Mg Tabs (Sulfamethoxazole-trimethoprim) .Marland Kitchen.. 1 by mouth two times a day  for bladder infection  Other Orders: UA Dipstick w/o Micro (automated)  (81003) T-Culture, Urine ([id number]) Prescription Created Electronically ([id number])  Patient Instructions: 1)  take the antibioitic     for possible bladder infection. 2)  You will be informed of culture  results when available.  Prescriptions: SULFAMETHOXAZOLE-TMP DS 800-160 MG TABS (SULFAMETHOXAZOLE-TRIMETHOPRIM) 1 by mouth two times a day  for bladder infection  #10 x 0   Entered and Authorized by:   Burnis Medin MD   Signed by:   Burnis Medin MD on 08/01/2009   Method used:   Electronically to        [id number] (retail)       [address]       [place], [place]  [address]       Ph: [phone] or [phone]       Fax: [phone]   RxID:   [id number]   Laboratory Results   Urine Tests  Date/Time Recieved: Aug 01, 2009 3:25 PM  Date/Time Reported: Aug 01, 2009 3:25 PM   Routine Urinalysis   Color: yellow Appearance: Clear Glucose: negative   (Normal Range: Negative) Bilirubin: negative   (Normal Range: Negative) Ketone: negative   (Normal Range: Negative) Spec. Gravity: <1.005   (Normal Range: 1.003-1.035) Blood: 2+   (Normal Range: Negative) pH: 6.0   (Normal Range: 5.0-8.0) Protein: negative   (Normal Range: Negative) Urobilinogen: 0.2   (Normal Range: 0-1) Nitrite: negative   (Normal Range: Negative) Leukocyte Esterace: 1+   (Normal Range: Negative)    Comments: Doy Hutching, CMA  Aug 01, 2009 3:25 PM

## 2010-05-01 NOTE — Assessment & Plan Note (Signed)
Summary: POST HOSP F/U.Marland KitchenSLM   Vital Signs:  Patient profile:   77 year old female Menstrual status:  hysterectomy Weight:      166 pounds Pulse rate:   60 / minute BP sitting:   120 / 80  (left arm) Cuff size:   regular  Vitals Entered By: Sherron Monday, CMA Deborra Medina) (January 15, 2010 9:43 AM) CC: [facility] Follow up   History of Present Illness: Ashley Lawson  comes in today  for follow up of  of hopital evaluation for abdominal pain  . She was admitted on thesurgery service and found not to have a sugical problem Per urology she  saw  PA about 10 days ago and and placed oncipro and did culture  and then got worse and  went to hospital  October 10 and 11th. has  follow up appt October  31th .   Since  discharge    called urology again and was given vesicare42m  over the weekend.  Hurting some again  but     unsure if emptying . correctly and fearful pain will come back like before had some bleeding from hemorrhoid  .  Chronic constipation  fiber and prune juice  no change.   Preventive Screening-Counseling & Management  Alcohol-Tobacco     Alcohol drinks/day: <1     Alcohol type: wine     Smoking Status: never     Passive Smoke Exposure: no  Caffeine-Diet-Exercise     Caffeine use/day: 0     Does Patient Exercise: yes     Type of exercise: walking and pool in summer     Times/week: 3  Current Medications (verified): 1)  Alprazolam 0.25 Mg Tabs (Alprazolam) .... Take 1 Tablet Once Daily As Needed 2)  Fish Oil 500 Mg Caps (Omega-3 Fatty Acids) .... Take 3)  Calcium 1200-1000 Mg-Unit Chew (Calcium Carbonate-Vit D-Min) 4)  Super Softget Multi 5)  Ranitidine Hcl 150 Mg Tabs (Ranitidine Hcl) ..Marland Kitchen. 1 By Mouth Two Times A Day As Directed 6)  Ciprofloxacin Hcl 500 Mg Tabs (Ciprofloxacin Hcl) ..Marland Kitchen. 1 By Mouth Two Times A Day 7)  Vesicare 10 Mg Tabs (Solifenacin Succinate) ..Marland Kitchen. 1 By Mouth Once Daily  Allergies (verified): 1)  ! Fosamax 2)  ! * Achromycin 3)  Amoxicillin  (Amoxicillin)  Past History:  Past medical, surgical, family and social histories (including risk factors) reviewed for relevance to current acute and chronic problems.  Past Medical History: Allergic rhinitis Hyperlipidemia Osteoporosis dexa 2011  -2.7 hip nl spine  IBS  constipation predominant Diverticulosis, colon October 2011 abd pain  hospitalized   Past Surgical History: Reviewed history from 06/16/2009 and no changes required. Hysterectomy Cataract extraction   2010. both   Past History:  Care Management: Allergy: Dr. LVelora Heckler or whelan  GI  BElie ConferBrayton ElUrology: WJeffie Pollock Family History: Reviewed history from 06/05/2007 and no changes required. sister with fx hip and mom hx fx hip.   Social History: Reviewed history from 07/14/2008 and no changes required. Retired Never Smoked Alcohol use-yes Regular exercise-yes widowed     at home with chidlren and GKs  hhof  2   puppy     Review of Systems  The patient denies anorexia, fever, weight loss, weight gain, vision loss, decreased hearing, prolonged cough, melena, hematochezia, severe indigestion/heartburn, hematuria, muscle weakness, difficulty walking, enlarged lymph nodes, and angioedema.    Physical Exam  General:  Well-developed,well-nourished,in no acute distress; alert,appropriate and cooperative throughout examination Head:  normocephalic and atraumatic.   Neck:  No deformities, masses, or tenderness noted. Lungs:  normal respiratory effort and no intercostal retractions.   Heart:  normal rate and regular rhythm.   Abdomen:  soft, normal bowel sounds, no distention, no hepatomegaly, and no splenomegaly.   mild tenderness suprapubic area and left lower quadrandt but no obv mass effect  Pulses:  pulses intact without delay   Extremities:  no clubbing cyanosis or edema  Neurologic:  non focal grossly Cervical Nodes:  No lymphadenopathy noted Psych:  Oriented X3, memory intact for recent and  remote, good eye contact, and slightly anxious.   Hospital evaluation obtained  and reviewed   note ct scan nl  .  Impression & Recommendations:  Problem # 1:  BLADDER DYSFUNCTION (ICD-596.59) abd pain   felt better after foley and now increasing again  difficulty with bladder emptying    not acute today but agree may need to be seen earlier tha 2 weeks form now by urologist . Hx of bladder tack  no narcotics or decongewstants adding to problems  Orders: Specimen Handling (99000) Venipuncture (09295) TLB-TSH (Thyroid Stimulating Hormone) (84443-TSH)  Problem # 2:  CONSTIPATION, CHRONIC (ICD-564.09)  no change MOM and fiber etc.   Orders: Specimen Handling (99000) Venipuncture (74734) TLB-TSH (Thyroid Stimulating Hormone) (84443-TSH)  Problem # 3:  HYPONATREMIA, MILD (ICD-276.1) [place] of hosp records and no obv cause of such   will repeat today .  Orders: Specimen Handling (99000) Venipuncture (03709) TLB-BMP (Basic Metabolic Panel-BMET) (64383-KFMMCRF) TLB-TSH (Thyroid Stimulating Hormone) (84443-TSH)  Complete Medication List: 1)  Alprazolam 0.25 Mg Tabs (Alprazolam) .... Take 1 tablet once daily as needed 2)  Fish Oil 500 Mg Caps (Omega-3 fatty acids) .... Take 3)  Calcium 1200-1000 Mg-unit Chew (Calcium carbonate-vit d-min) 4)  Super Softget Multi  5)  Ranitidine Hcl 150 Mg Tabs (Ranitidine hcl) .Marland Kitchen.. 1 by mouth two times a day as directed 6)  Ciprofloxacin Hcl 500 Mg Tabs (Ciprofloxacin hcl) .Marland Kitchen.. 1 by mouth two times a day 7)  Vesicare 10 Mg Tabs (Solifenacin succinate) .Marland Kitchen.. 1 by mouth once daily  Patient Instructions: 1)  You will be informed of lab results when available.  2)  continue the vesicare and  if getting worse  again then call us and the urologist  to be seen earlier.    Orders Added: 1)  Specimen Handling [99000] 2)  Venipuncture [54360] 3)  TLB-BMP (Basic Metabolic Panel-BMET) [67703-EKBTCYE] 4)  TLB-TSH (Thyroid Stimulating Hormone) [84443-TSH] 5)   Est. Patient Level IV [18590]

## 2010-05-01 NOTE — Progress Notes (Signed)
Summary: things she forgot yesterday & her bladder problem  Phone Note Call from Patient Call back at Home Phone [phone]   Summary of Call: 1)Forgot to mention yesterday that she's a nervous wreck, has a wedding Sat that she doesn't want to miss.  Requesting something mild for her nerves.  May have Alprazolam on hand, not sure she can find it.   Remembers before 1/2 tab helped her enough that she could sleep.  Should she use it or something else?  2)Could you get her into [facility] for the bladder problem yet this week?  She says this is part of why she's nervous.   [facility].  Allergic to Pcn, achromycin. Initial call taken by: Shelbie Hutching, RN,  Aug 02, 2009 12:24 PM  Follow-up for Phone Call        Per Dr. Regis Bill- okay to refill the xanax. Can get her in with Urology but it probably won't be this week. Give the antibiotic sometime to get in her system.  Follow-up by: Sherron Monday, CMA Deborra Medina),  Aug 02, 2009 2:01 PM  Additional Follow-up for Phone Call Additional follow up Details #1::        Pt aware and already has some xanax. She found them. She did call the Urologist and has an appt with them tomorrow at 3:30pm with the PA. Pt aware that we will fax over the ov notes. Additional Follow-up by: Sherron Monday, CMA (AAMA),  Aug 02, 2009 4:56 PM

## 2010-05-01 NOTE — Assessment & Plan Note (Signed)
Summary: follow up on dexa/ssc   Vital Signs:  Patient profile:   77 year old female Menstrual status:  hysterectomy Height:      60.25 inches Weight:      164 pounds BMI:     31.88 Pulse rate:   88 / minute BP sitting:   120 / 80  (left arm) Cuff size:   regular  Vitals Entered By: Sherron Monday, CMA Deborra Medina) (July 11, 2009 2:59 PM) CC: Follow-up visit on dexa   History of Present Illness: Lira Cobaugh comesin today for   for above reasons .  To review her dexa scan . See Last ov     in the past Was on fosamax very short term  less than  a year because she  had gi se.  Since then no new fractures .  No fam hx of hip facture but mom had hip surgery replacements.  Preventive Screening-Counseling & Management  Alcohol-Tobacco     Alcohol drinks/day: <1     Alcohol type: wine     Smoking Status: never     Passive Smoke Exposure: no  Caffeine-Diet-Exercise     Caffeine use/day: 0     Does Patient Exercise: yes     Type of exercise: walking and pool in summer     Times/week: 3  Current Medications (verified): 1)  Alprazolam 0.25 Mg Tabs (Alprazolam) .... Take 1 Tablet Once Daily As Needed 2)  Fish Oil 500 Mg Caps (Omega-3 Fatty Acids) .... Take 3)  Calcium 1200-1000 Mg-Unit Chew (Calcium Carbonate-Vit D-Min) 4)  Super Softget Multi 5)  Ranitidine Hcl 150 Mg Tabs (Ranitidine Hcl) .Marland Kitchen.. 1 By Mouth Two Times A Day As Directed  Allergies (verified): 1)  ! Fosamax 2)  Amoxicillin (Amoxicillin)  Past History:  Past medical, surgical, family and social histories (including risk factors) reviewed for relevance to current acute and chronic problems.  Past Medical History: Allergic rhinitis Hyperlipidemia Osteoporosis dexa 2011  -2.7 hip nl spine  IBS  constipation predominant Diverticulosis, colon  Past Surgical History: Reviewed history from 06/16/2009 and no changes required. Hysterectomy Cataract extraction   2010. both   Past History:  Care  Management: Allergy: Dr. Velora Heckler  or whelan  GI  Elie Confer: Mccuwen  Family History: Reviewed history from 06/05/2007 and no changes required. sister with fx hip and mom hx fx hip.   Social History: Reviewed history from 07/14/2008 and no changes required. Retired Never Smoked Alcohol use-yes Regular exercise-yes widowed     at home with chidlren and GKs  hhof  2   puppy     Physical Exam  General:  Well-developed,well-nourished,in no acute distress; alert,appropriate and cooperative throughout examination Psych:  Oriented X3, good eye contact, not depressed appearing, and slightly anxious.   look well  and not distressed    Impression & Recommendations:  Problem # 1:  OSTEOPOROSIS (ICD-733.00) see dexa   had se of the fosmax when tried in the past.   she seems to be a candidate for reclast   but this can be done after the wedding and Gi eval.   will add labs to her  next labs . Her updated medication list for this problem includes:    Calcium 1200-1000 Mg-unit Chew (Calcium carbonate-vit d-min)  Complete Medication List: 1)  Alprazolam 0.25 Mg Tabs (Alprazolam) .... Take 1 tablet once daily as needed 2)  Fish Oil 500 Mg Caps (Omega-3 fatty acids) .... Take 3)  Calcium 1200-1000 Mg-unit Chew (Calcium  carbonate-vit d-min) 4)  Super Softget Multi  5)  Ranitidine Hcl 150 Mg Tabs (Ranitidine hcl) .Marland Kitchen.. 1 by mouth two times a day as directed  Patient Instructions: 1)  plan get Vit  D to labs as well as pth  we will add this on to your  labs  2)  dx osteoporosis. 3)  Call when after all you obligations medical and wedding  and 4)  then we can  set up infusion for   osteopoosis medicine ( reclast)  greater than 50% of visit spent in counseling  20 minutes

## 2010-05-01 NOTE — Progress Notes (Signed)
Summary: refill on alprazolam  Phone Note From Pharmacy   Caller: [phone] Reason for Call: Needs renewal Details for Reason: alprazolam 0.64m Summary of Call: last filled on 07/01/09 #30 Initial call taken by: SSherron Monday CMin[place](AAMA),  January 26, 2010 8:11 AM  Follow-up for Phone Call        ok x 1  Follow-up by: WBurnis MedinMD,  January 26, 2010 11:13 AM  Additional Follow-up for Phone Call Additional follow up Details #1::        Rx faxed to pharmacy Additional Follow-up by: SSherron Monday CMA (Deborra Medina,  January 26, 2010 11:15 AM    Prescriptions: ALPRAZOLAM 0.25 MG TABS (ALPRAZOLAM) Take 1 tablet once daily as needed  #30 x 0   Entered by:   SSherron Monday CMA (AAMA)   Authorized by:   WBurnis MedinMD   Signed by:   SSherron Monday CMA (AAMA) on 01/26/2010   Method used:   Handwritten   RxID::   [id number]

## 2010-05-01 NOTE — Letter (Signed)
Summary: [facility]  [facility]   Imported By: Laural Benes 12/01/2009 11:06:24  _____________________________________________________________________  External Attachment:    Type:   Image     Comment:   External Document

## 2010-05-01 NOTE — Procedures (Signed)
Summary: Colonoscopy  Patient: Terasa Orsini Note: All result statuses are Final unless otherwise noted.  Tests: (1) Colonoscopy (COL)   COL Colonoscopy           [facility]     [place], [place]  [address]           COLONOSCOPY PROCEDURE REPORT           PATIENT:  Ashley Lawson, Hast  MR#:  [id number]     BIRTHDATE:  Mar 06, 1933, 77 yrs. old  GENDER:  female     ENDOSCOPIST:  Lowella Bandy. Olevia Perches, MD     REF. BY:  Standley Brooking. Panosh, M.D.     PROCEDURE DATE:  08/24/2009     PROCEDURE:  Colonoscopy 36629     ASA CLASS:  Class II     INDICATIONS:  hx of hyperplastic polyp 2000,     brother with colon cancer     last colon 2005     MEDICATIONS:   Versed 10 mg, Fentanyl 75 mcg           DESCRIPTION OF PROCEDURE:   After the risks benefits and     alternatives of the procedure were thoroughly explained, informed     consent was obtained.  Digital rectal exam was performed and     revealed no rectal masses.   The LB [id number] endoscope     was introduced through the anus and advanced to the cecum, which     was identified by both the appendix and ileocecal valve, without     limitations.  The quality of the prep was good, using MiraLax.     The instrument was then slowly withdrawn as the colon was fully     examined.     <<PROCEDUREIMAGES>>           FINDINGS:  Mild diverticulosis was found in the sigmoid colon (see     image5 and image6).  This was otherwise a normal examination of     the colon (see image7, image4, image3, image2, and image1).     Internal hemorrhoids were found (see image8).   Retroflexed views     in the rectum revealed no abnormalities.    The scope was then     withdrawn from the patient and the procedure completed.           COMPLICATIONS:  None     ENDOSCOPIC IMPRESSION:     1) Mild diverticulosis in the sigmoid colon     2) Otherwise normal examination     3) Internal hemorrhoids     RECOMMENDATIONS:     1) high fiber  diet     REPEAT EXAM:  In 5 year(s) for.  may not need a recall colon due     to age           ______________________________     Lowella Bandy. Olevia Perches, MD           CC:           n.     eSIGNED:   Lowella Bandy. Norlan Rann at 08/24/2009 10:11 AM           [place], [place], [phone]  Note: An exclamation mark (!) indicates a result that was not dispersed into the flowsheet. Document Creation Date: 08/24/2009 10:12 AM _______________________________________________________________________  (1) Order result status: Final Collection or observation date-time: 08/24/2009 10:04 Requested date-time:  Receipt date-time:  Reported date-time:  Referring Physician:   Ordering Physician: [name] [id number]) Specimen Source:  Source: Tawanna Cooler Order Number: [id number] Lab site:   Appended Document: Colonoscopy    Clinical Lists Changes  Observations: Added new observation of COLONNXTDUE: 07/2014 (08/24/2009 10:49)

## 2010-05-03 NOTE — Letter (Signed)
Summary: [facility]  [facility]   Imported By: Laural Benes 04/27/2010 13:45:58  _____________________________________________________________________  External Attachment:    Type:   Image     Comment:   External Document

## 2010-06-13 LAB — COMPREHENSIVE METABOLIC PANEL
ALT: 26 U/L (ref 0–35)
AST: 33 U/L (ref 0–37)
Albumin: 3.9 g/dL (ref 3.5–5.2)
Alkaline Phosphatase: 64 U/L (ref 39–117)
BUN: 10 mg/dL (ref 6–23)
CO2: 22 mEq/L (ref 19–32)
Calcium: 9.1 mg/dL (ref 8.4–10.5)
Chloride: 94 mEq/L — ABNORMAL LOW (ref 96–112)
Creatinine, Ser: 0.84 mg/dL (ref 0.4–1.2)
GFR calc Af Amer: >60 mL/min (ref >60)
GFR calc non Af Amer: >60 mL/min (ref >60)
Glucose, Bld: 91 mg/dL (ref 70–99)
Potassium: 4 mEq/L (ref 3.5–5.1)
Sodium: 125 mEq/L — ABNORMAL LOW (ref 135–145)
Total Bilirubin: 0.6 mg/dL (ref 0.3–1.2)
Total Protein: 6.9 g/dL (ref 6.0–8.3)

## 2010-06-13 LAB — URINALYSIS, ROUTINE W REFLEX MICROSCOPIC
Bilirubin Urine: NEGATIVE
Glucose, UA: NEGATIVE mg/dL
Ketones, ur: NEGATIVE mg/dL
Leukocytes, UA: NEGATIVE
Nitrite: NEGATIVE
Protein, ur: NEGATIVE mg/dL
Specific Gravity, Urine: 1.007 (ref 1.005–1.030)
Urobilinogen, UA: 0.2 mg/dL (ref 0.0–1.0)
pH: 7 (ref 5.0–8.0)

## 2010-06-13 LAB — DIFFERENTIAL
Basophils Absolute: 0 10*3/uL (ref 0.0–0.1)
Basophils Relative: 0 % (ref 0–1)
Basophils Relative: 0 % (ref 0–1)
Eosinophils Absolute: 0 10*3/uL (ref 0.0–0.7)
Eosinophils Absolute: 0.1 10*3/uL (ref 0.0–0.7)
Eosinophils Relative: 0 % (ref 0–5)
Lymphocytes Relative: 27 % (ref 12–46)
Lymphs Abs: 2.7 10*3/uL (ref 0.7–4.0)
Monocytes Absolute: 0.7 10*3/uL (ref 0.1–1.0)
Monocytes Relative: 7 % (ref 3–12)
Monocytes Relative: 9 % (ref 3–12)
Neutro Abs: 6.4 10*3/uL (ref 1.7–7.7)
Neutrophils Relative %: 64 % (ref 43–77)
Neutrophils Relative %: 65 % (ref 43–77)

## 2010-06-13 LAB — BASIC METABOLIC PANEL
BUN: 8 mg/dL (ref 6–23)
CO2: 26 mEq/L (ref 19–32)
Calcium: 8.4 mg/dL (ref 8.4–10.5)
Chloride: 101 mEq/L (ref 96–112)
Creatinine, Ser: 0.73 mg/dL (ref 0.4–1.2)
GFR calc Af Amer: >60 mL/min (ref >60)
GFR calc non Af Amer: >60 mL/min (ref >60)
Glucose, Bld: 94 mg/dL (ref 70–99)
Potassium: 3.9 mEq/L (ref 3.5–5.1)
Sodium: 133 mEq/L — ABNORMAL LOW (ref 135–145)

## 2010-06-13 LAB — CBC
HCT: 34.3 % — ABNORMAL LOW (ref 36.0–46.0)
HCT: 38 % (ref 36.0–46.0)
Hemoglobin: 12 g/dL (ref 12.0–15.0)
Hemoglobin: 13.5 g/dL (ref 12.0–15.0)
MCH: 32.3 pg (ref 26.0–34.0)
MCH: 32.3 pg (ref 26.0–34.0)
MCHC: 34.8 g/dL (ref 30.0–36.0)
MCHC: 35.5 g/dL (ref 30.0–36.0)
MCV: 91.1 fL (ref 78.0–100.0)
MCV: 92.5 fL (ref 78.0–100.0)
Platelets: 236 10*3/uL (ref 150–400)
Platelets: 255 10*3/uL (ref 150–400)
RBC: 3.71 MIL/uL — ABNORMAL LOW (ref 3.87–5.11)
RBC: 4.17 MIL/uL (ref 3.87–5.11)
RDW: 12.9 % (ref 11.5–15.5)
RDW: 13 % (ref 11.5–15.5)
WBC: 7.3 10*3/uL (ref 4.0–10.5)
WBC: 9.8 10*3/uL (ref 4.0–10.5)

## 2010-06-13 LAB — URINE CULTURE
Colony Count: NO GROWTH
Culture  Setup Time: 201110110145
Culture: NO GROWTH

## 2010-06-13 LAB — URINE MICROSCOPIC-ADD ON

## 2010-07-16 ENCOUNTER — Other Ambulatory Visit: Payer: Self-pay | Admitting: Internal Medicine

## 2010-07-16 DIAGNOSIS — Z1231 Encounter for screening mammogram for malignant neoplasm of breast: Secondary | ICD-10-CM

## 2010-07-20 ENCOUNTER — Ambulatory Visit (HOSPITAL_COMMUNITY)
Admission: RE | Admit: 2010-07-20 | Discharge: 2010-07-20 | Disposition: A | Discharge status: Home / Self Care | Payer: Medicare Other | Source: Ambulatory Visit | Attending: Internal Medicine | Admitting: Internal Medicine

## 2010-07-20 DIAGNOSIS — Z1231 Encounter for screening mammogram for malignant neoplasm of breast: Secondary | ICD-10-CM | POA: Insufficient documentation

## 2010-09-20 ENCOUNTER — Other Ambulatory Visit: Payer: Self-pay | Admitting: Dermatology

## 2010-10-05 ENCOUNTER — Encounter: Payer: Self-pay | Admitting: Internal Medicine

## 2010-10-05 DIAGNOSIS — R109 Unspecified abdominal pain: Secondary | ICD-10-CM | POA: Insufficient documentation

## 2010-10-08 ENCOUNTER — Encounter: Payer: Self-pay | Admitting: Internal Medicine

## 2010-10-08 ENCOUNTER — Ambulatory Visit (INDEPENDENT_AMBULATORY_CARE_PROVIDER_SITE_OTHER): Payer: Medicare Other | Admitting: Internal Medicine

## 2010-10-08 VITALS — BP 130/80 | Temp 98.4°F | Wt 174.0 lb

## 2010-10-08 DIAGNOSIS — K5730 Diverticulosis of large intestine without perforation or abscess without bleeding: Secondary | ICD-10-CM

## 2010-10-08 DIAGNOSIS — C4490 Unspecified malignant neoplasm of skin, unspecified: Secondary | ICD-10-CM

## 2010-10-08 DIAGNOSIS — R140 Abdominal distension (gaseous): Secondary | ICD-10-CM | POA: Insufficient documentation

## 2010-10-08 DIAGNOSIS — K589 Irritable bowel syndrome without diarrhea: Secondary | ICD-10-CM

## 2010-10-08 DIAGNOSIS — K5909 Other constipation: Secondary | ICD-10-CM

## 2010-10-08 DIAGNOSIS — F411 Generalized anxiety disorder: Secondary | ICD-10-CM

## 2010-10-08 DIAGNOSIS — R141 Gas pain: Secondary | ICD-10-CM

## 2010-10-08 MED ORDER — RANITIDINE HCL 150 MG PO TABS: 150.0000 mg | ORAL_TABLET | Freq: Two times a day (BID) | ORAL | Status: DC

## 2010-10-08 NOTE — Progress Notes (Signed)
  Subjective:    Patient ID: Ashley Lawson, female    DOB: 03/03/1933, 77 y.o.   MRN: [id number]  HPI patient comesin for   aggavation of her chornic sx .    Increase frequency   For 2 weeks  Small amounts for a whlie.   Stools soft.    Gassy and mid apigastric araea to chests "comes up" like food not digested but no vomiting wght  loss or real pain in abdomen No recent ranitidine use  for the past year.    Review of Systems Neg cp sob swelling vision or hearing changes bleeding or falling  No utis sx     Objective:   Physical Exam WDWN in nad  Neck no masses  Supple Chest:  Clear to A&P without wheezes rales or rhonchi CV:  S1-S2 no gallops or murmurs peripheral perfusion is normal Abdomen:  Soft normal bowel sounds without hepatosplenomegaly, no guarding rebound or masses no CVA tenderness non focal   Oriented x 3 and no noted deficits in memory, attention, and speech. Mildly anxious   Skin  Non icteric      Assessment & Plan:  Bloating  A good bit    Gassy  ? If change in bowel habits  ;   she has had some spells before   Doesn't seem like diverticulitis  Hx of constipation/ IBS ? Reflux .. paresis    By hx  Some change but no alarm features otherwise . Try ranitidine again and  Food changes  Consider pro biotics   If   Ongoing   Get Dr Olevia Perches to see.  No UTI sx currently Anxiety seems stable Due for  wellness soon can schedule of wellness in 4 months or prn.  Total visit 52mns > 50% spent counseling and coordinating care

## 2010-10-08 NOTE — Patient Instructions (Addendum)
Continue small amount  s frequently.   Can try [place]  . Or greek yogurt to see if helps.   Ok to liit the miralax Go back on the ranitidine  In addition twice a day before meals .  If  persistent or progressive    Over the next   2-3 weeks then we should have you see dr Olevia Perches.

## 2010-10-14 DIAGNOSIS — C4490 Unspecified malignant neoplasm of skin, unspecified: Secondary | ICD-10-CM | POA: Insufficient documentation

## 2010-11-26 ENCOUNTER — Ambulatory Visit (INDEPENDENT_AMBULATORY_CARE_PROVIDER_SITE_OTHER): Payer: Medicare Other | Admitting: Internal Medicine

## 2010-11-26 ENCOUNTER — Encounter: Payer: Self-pay | Admitting: Internal Medicine

## 2010-11-26 VITALS — BP 150/90 | HR 60 | Temp 98.8°F | Wt 175.0 lb

## 2010-11-26 DIAGNOSIS — M79609 Pain in unspecified limb: Secondary | ICD-10-CM

## 2010-11-26 DIAGNOSIS — F411 Generalized anxiety disorder: Secondary | ICD-10-CM

## 2010-11-26 DIAGNOSIS — M79629 Pain in unspecified upper arm: Secondary | ICD-10-CM

## 2010-11-26 NOTE — Patient Instructions (Signed)
Will let you know after I review   Dr Stann Ore note and labs and make a plan. This may include more blood tests and or Rheumatology consult. Advil is ok but  Limit amount for now, Let us know if something is worse .

## 2010-11-26 NOTE — Progress Notes (Signed)
  Subjective:    Patient ID: Ashley Lawson, female    DOB: 1932-09-01, 77 y.o.   MRN: [id number]  HPI  Patient comes in for an acute appointment visit. She was seen by orthopedics on Friday and this is Monday. She has been having some right sided upper arm muscle pain right and slightly on the left for about 2 months.  Dr Telford Nab  evaluated her . No xray.  Concerned skin changes .  Ordered blood  work at [facility].      And no results yet.  Said he would send the results to [place] and that we should manage her problem. He called it an inflammation of the muscle. Bothering her for about 2 months or so.  Feels sore after allergy shots.  No leg sx with this. No neck pain.    No nocturnal pain  advil helps.  Onset   Insidious.   Does work out in pool but not a lot .  Otherwise no injury neck pain fever total body myalgia Medications. No supplements except for fish oil. The only thing otherwise noted the MiraLax for constipation. He is now very anxious about this.  Wants to make sure it is not cancer.   Review of Systems Negative for chest pain shortness of breath blood in her stool weakness. She does have skin changes and some red spots she saw the dermatologist a few months ago. No acute rashes  Gi issue no change  Past history family history social history reviewed in the electronic medical record.     Objective:   Physical Exam  Well-developed well-nourished well-appearing in no acute distress mildly anxious. Her gait is within normal limits. Neck no masses or bruit or adenopathy  Chest:  Clear to A&P without wheezes rales or rhonchi CV:  S1-S2 no gallops or murmurs peripheral perfusion is normal Ext good rom no joint effusion right  BR more prominent that left and points to upper arm muscles as area of  tenderness no redness or wamth or edema Good rom and strength,  OA changes in the hands  Neuro grossly normal Skin sun damage and some redness  Changes no acute rashes.     Assessment &  Plan:  Upper arm muscle tenderness   No acute findings on exam. We'll await   for copy of ov and lab results  and plan for further evaluation. Seems too localized for  Generalized myositis or pmr.    Anxiety  :  Disc about findings that are not alarming at this point .    Ok to take a xanax prn at night     Will contact her about further    eval on cell phone contact

## 2010-11-29 ENCOUNTER — Ambulatory Visit (INDEPENDENT_AMBULATORY_CARE_PROVIDER_SITE_OTHER): Payer: Medicare Other | Admitting: Internal Medicine

## 2010-11-29 ENCOUNTER — Encounter: Payer: Self-pay | Admitting: Internal Medicine

## 2010-11-29 VITALS — BP 118/78 | HR 91 | Temp 98.5°F | Wt 173.0 lb

## 2010-11-29 DIAGNOSIS — M79629 Pain in unspecified upper arm: Secondary | ICD-10-CM

## 2010-11-29 DIAGNOSIS — M79609 Pain in unspecified limb: Secondary | ICD-10-CM

## 2010-11-29 NOTE — Patient Instructions (Addendum)
The labs tests were normal. Will do a rheumatology appointment. Would continue advil   for now as this helps better  fo rinflammation. Unsure if this is muscle pain from your shots or could be a pinched nerve referred pain.   For now  Hold on the allergy shots.

## 2010-11-29 NOTE — Progress Notes (Signed)
  Subjective:    Patient ID: Ashley Lawson, female    DOB: 11-04-1932, 77 y.o.   MRN: [id number]  HPI Patient comes in today for acute appointment for followup of her previous visit. Still concerned about her arm pain. Advil helps better than Tylenol.  And have told her that she should have something done about her pain.  She does state that allergy shots may get sore and it takes to get future ones for now.  It never bothered her arms before.  Ascus of we have received the notes and the laboratory studies from Dr. Roxan Diesel office.   Review of Systems No chest pain shortness of breath anxiety about the same denies any neck pain but occasionally gets hand pain with some arthritis in her hand no new rashes has some redness along her arms as previously discussed history of skin cancer on her leg to follow up with dermatology in November. Denies numbness or weakness all over  Past Medical History  Diagnosis Date  . Allergic rhinitis   . Hyperlipidemia   . Osteoporosis     dexa 2011 -2.7 hip nl spine  . IBS (irritable bowel syndrome)     constipation predominant  . Diverticulosis of colon   . Abdominal pain 12/2009    hospitalized   Past Surgical History  Procedure Date  . Abdominal hysterectomy   . Cataract extraction 2010    both    reports that she has never smoked. She does not have any smokeless tobacco history on file. She reports that she drinks alcohol. Her drug history not on file. family history includes Other in her mother and sister. Allergies  Allergen Reactions  . Alendronate Sodium     REACTION: upset stomach  . Amoxicillin     REACTION: unspecified       Objective:   Physical Exam Well-developed well-nourished somewhat anxious in no acute distress holds her right biceps area arm but no point tenderness good range of motion His arthritis changes in her hands but no redness or unusual nodules.  Skin shows many sun changes and blotchy redness that I believe his  sun changes none of these are over her area of pain  Review Dr. Roxan Diesel note he was unsure of the diagnosis but decided it was not from joint pain  CMP and CBC and CPK were normal . There was no CRP or sedimentation rate .     Assessment & Plan:  Arm pain upper proximal right greater than left  I suppose it could be from her allergy shots but this is a new onset 2 months symptom she is worried about it responds better to an anti-inflammatory.  Spent a good deal of time discussing what myositis means that it is very nonspecific.  That we are unsure of the diagnosis at this point. I am not totally convinced that she could not have radiating pain.. unsure if PMR can present like this with localized pain. She does have arthritis changes in her hands. Agree with rheumatology consult sent urgently because the patient to stress and worry. Encouraged her to see her dermatologist but would wait for the rheumatology consult first Total visit 33mns > 50% spent counseling and coordinating care

## 2010-12-24 ENCOUNTER — Other Ambulatory Visit: Payer: Self-pay | Admitting: *Deleted

## 2010-12-24 MED ORDER — ALPRAZOLAM 0.25 MG PO TABS: 0.2500 mg | ORAL_TABLET | Freq: Every evening | ORAL | Status: DC | PRN

## 2010-12-24 NOTE — Telephone Encounter (Signed)
Ok x 1

## 2010-12-24 NOTE — Telephone Encounter (Signed)
Refill on alprazolam 0.83m Last filled on 01/26/10 LOV 11/29/10 NOV 02/18/11

## 2010-12-24 NOTE — Telephone Encounter (Signed)
Rx faxed to pharmacy  

## 2011-01-25 ENCOUNTER — Ambulatory Visit (INDEPENDENT_AMBULATORY_CARE_PROVIDER_SITE_OTHER): Payer: Medicare Other

## 2011-01-25 DIAGNOSIS — Z23 Encounter for immunization: Secondary | ICD-10-CM

## 2011-02-18 ENCOUNTER — Ambulatory Visit (INDEPENDENT_AMBULATORY_CARE_PROVIDER_SITE_OTHER): Payer: Medicare Other | Admitting: Internal Medicine

## 2011-02-18 ENCOUNTER — Encounter: Payer: Self-pay | Admitting: Internal Medicine

## 2011-02-18 VITALS — BP 130/80 | HR 60 | Ht 60.25 in | Wt 172.0 lb

## 2011-02-18 DIAGNOSIS — R21 Rash and other nonspecific skin eruption: Secondary | ICD-10-CM | POA: Insufficient documentation

## 2011-02-18 DIAGNOSIS — K589 Irritable bowel syndrome without diarrhea: Secondary | ICD-10-CM

## 2011-02-18 DIAGNOSIS — Z136 Encounter for screening for cardiovascular disorders: Secondary | ICD-10-CM

## 2011-02-18 DIAGNOSIS — Z13220 Encounter for screening for lipoid disorders: Secondary | ICD-10-CM | POA: Insufficient documentation

## 2011-02-18 DIAGNOSIS — Z0000 Encounter for general adult medical examination without abnormal findings: Secondary | ICD-10-CM

## 2011-02-18 DIAGNOSIS — F411 Generalized anxiety disorder: Secondary | ICD-10-CM

## 2011-02-18 DIAGNOSIS — M79629 Pain in unspecified upper arm: Secondary | ICD-10-CM

## 2011-02-18 DIAGNOSIS — M810 Age-related osteoporosis without current pathological fracture: Secondary | ICD-10-CM

## 2011-02-18 DIAGNOSIS — J309 Allergic rhinitis, unspecified: Secondary | ICD-10-CM

## 2011-02-18 DIAGNOSIS — E785 Hyperlipidemia, unspecified: Secondary | ICD-10-CM

## 2011-02-18 DIAGNOSIS — M79609 Pain in unspecified limb: Secondary | ICD-10-CM

## 2011-02-18 LAB — BASIC METABOLIC PANEL
BUN: 13 mg/dL (ref 6–23)
CO2: 25 mEq/L (ref 19–32)
Calcium: 9.2 mg/dL (ref 8.4–10.5)
Creatinine, Ser: 0.8 mg/dL (ref 0.4–1.2)
Glucose, Bld: 91 mg/dL (ref 70–99)

## 2011-02-18 LAB — LIPID PANEL
HDL: 53.4 mg/dL (ref >39.00)
Total CHOL/HDL Ratio: 4
Triglycerides: 178 mg/dL — ABNORMAL HIGH (ref 0.0–149.0)

## 2011-02-18 LAB — HEPATIC FUNCTION PANEL
Albumin: 4.2 g/dL (ref 3.5–5.2)
Bilirubin, Direct: 0.1 mg/dL (ref 0.0–0.3)
Total Protein: 7.7 g/dL (ref 6.0–8.3)

## 2011-02-18 LAB — CBC WITH DIFFERENTIAL/PLATELET
Basophils Relative: 0.8 % (ref 0.0–3.0)
Eosinophils Relative: 2.6 % (ref 0.0–5.0)
Lymphocytes Relative: 36.3 % (ref 12.0–46.0)
Monocytes Relative: 7.9 % (ref 3.0–12.0)
Neutrophils Relative %: 52.4 % (ref 43.0–77.0)
RBC: 4.21 Mil/uL (ref 3.87–5.11)
WBC: 5.9 10*3/uL (ref 4.5–10.5)

## 2011-02-18 LAB — TSH: TSH: 3.69 u[IU]/mL (ref 0.35–5.50)

## 2011-02-18 NOTE — Patient Instructions (Addendum)
Will notify you  of labs when available. Then plan follow up Ask dermatology about the skin changes.  Continue healthy eating and exercise .   Will review to see when last Bone density was done and see if we need another to check

## 2011-02-18 NOTE — Progress Notes (Signed)
Subjective:    Patient ID: Ashley Lawson, female    DOB: 1933-02-18, 77 y.o.   MRN: [id number]  HPI Patient comes in today for wellness visit and follow-up of medical issues. Update of her history since her last visit. Taking  1/2 inflammatory med for arm pain .   To see rheum next months.   Arms much better  ? If from the shots . Better some   Still has rash reddened on arms no other changes    Hearing:  Ok   Vision:  No limitations at present .  Safety:  Has smoke detector and wears seat belts.  No firearms. No excess sun exposure. Sees dentist regularly.  Falls:  NO   Advance directive :  Reviewed  Has one.  Memory: Felt to be good  , no concern from her or her family.  Depression: No anhedonia unusual crying or depressive symptoms    Nutrition: Eats  balanced diet; adequate calcium and vitamin D. No swallowing chewiing problems.  Injury: no major injuries in the last six months.  Other healthcare providers:  Reviewed today .  Social:  Lives  With  Daughter . puppy pets.   Preventive parameters: up-to-date on colonoscopy, mammogram, immunizations. Including Tdap and pneumovax.  ADLS:   There are no problems or need for assistance  driving, feeding, obtaining food, dressing, toileting and bathing, managing money using phone. She is independent.   Review of Systems ROS:  GEN/ HEENTNo fever, significant weight changes sweats headaches vision problems hearing changes, CV/ PULM; No chest pain shortness of breath cough, syncope,edema  change in exercise tolerance. GI /GU: No adominal pain, vomiting, change in bowel habits. No blood in the stool. No significant GU symptoms. SKIN/HEME: ,no  bleeding. No lymphadenopathy, nodules, masses.   Arms have rough red rash no recent skin exposure NEURO/ PSYCH:  No neurologic signs such as weakness numbness No depression anxiety. IMM/ Allergy: No unusual infections.  Allergy .   REST of 12 system review negative  Past Medical  History  Diagnosis Date  . Allergic rhinitis   . Hyperlipidemia   . Osteoporosis     dexa 2011 -2.7 hip nl spine  . IBS (irritable bowel syndrome)     constipation predominant  . Diverticulosis of colon   . Abdominal pain 12/2009    hospitalized   Past Surgical History  Procedure Date  . Abdominal hysterectomy   . Cataract extraction 2010    both    reports that she has never smoked. She does not have any smokeless tobacco history on file. She reports that she drinks alcohol. Her drug history not on file. family history includes Other in her mother and sister. Allergies  Allergen Reactions  . Alendronate Sodium     REACTION: upset stomach  . Amoxicillin     REACTION: unspecified       Objective:   Physical Exam Physical Exam: Vital signs reviewed [id number] is a well-developed well-nourished alert cooperative  white female who appears her stated age in no acute distress.  HEENT: normocephalic  traumatic , Eyes: PERRL EOM's full, conjunctiva clear, Nares: paten,t no deformity discharge or tenderness., Ears: no deformity EAC's clear TMs with normal landmarks. Mouth: clear OP, no lesions, edema.  Moist mucous membranes. Dentition in adequate repair. NECK: supple without masses, thyromegaly or bruits. CHEST/PULM:  Clear to auscultation and percussion breath sounds equal no wheeze , rales or rhonchi. No chest wall deformities or tenderness. CV: PMI is nondisplaced, S1 S2  no gallops, murmurs, rubs. Peripheral pulses are full without delay.No JVD .  Breast: normal by inspection  But healing bruise on right . No dimpling, discharge, masses, tenderness or discharge .  ABDOMEN: Bowel sounds normal nontender  No guard or rebound, no hepato splenomegal no CVA tenderness.  No hernia. Extremtities:  No clubbing cyanosis or edema, no acute joint swelling or redness no focal atrophy;  OA changes  NEURO:  Oriented x3, cranial nerves 3-12 appear to be intact, no obvious focal weakness,gait  within normal limits no abnormal reflexes or asymmetrical SKIN: No or petechiae.  Bruise healing right breast and senile bruisleft forearm  Rough reddened area in sunexposed area    An arms thickened    No nodules .  PSYCH: Oriented, good eye contact, no obvious depression anxiety, cognition and judgment appear normal. EKG rate sinus rhythym low voltage     Assessment & Plan:  Preventive Health Care Counseled regarding healthy nutrition, exercise, sleep, injury prevention, calcium vit d and healthy weight .  IBS  Stable  Continue fiber fruits Arm Pain better Skin changes  Look like  Sun damaged areas to me but is a bit reddned   OA    mostly in hands Hx of osteoporosis ? When last dexa was . Will review  No hx of fracture Anxiety   stable

## 2011-02-18 NOTE — Assessment & Plan Note (Signed)
reviewe of record  ? Last dexa  -2.7 hip noted poss 2011 but  Report not found

## 2011-02-19 LAB — VITAMIN D 25 HYDROXY (VIT D DEFICIENCY, FRACTURES): Vit D, 25-Hydroxy: 43 ng/mL (ref 30–89)

## 2011-02-25 ENCOUNTER — Encounter: Payer: Self-pay | Admitting: *Deleted

## 2011-02-25 NOTE — Progress Notes (Signed)
Quick Note:    Letter sent to pt.  ______

## 2011-03-18 ENCOUNTER — Telehealth: Payer: Self-pay | Admitting: *Deleted

## 2011-03-18 NOTE — Telephone Encounter (Signed)
Refill on alprazolam 0.45m last filled on 12/25/10

## 2011-03-19 MED ORDER — ALPRAZOLAM 0.25 MG PO TABS: 0.2500 mg | ORAL_TABLET | Freq: Every evening | ORAL | Status: DC | PRN

## 2011-03-19 NOTE — Telephone Encounter (Signed)
Call in #30 with no rf  

## 2011-03-21 ENCOUNTER — Encounter: Payer: Self-pay | Admitting: Internal Medicine

## 2011-03-21 ENCOUNTER — Ambulatory Visit (INDEPENDENT_AMBULATORY_CARE_PROVIDER_SITE_OTHER): Payer: Medicare Other | Admitting: Internal Medicine

## 2011-03-21 VITALS — BP 130/90 | Temp 98.7°F | Wt 173.0 lb

## 2011-03-21 DIAGNOSIS — J069 Acute upper respiratory infection, unspecified: Secondary | ICD-10-CM

## 2011-03-21 MED ORDER — HYDROCODONE-HOMATROPINE 5-1.5 MG/5ML PO SYRP: 2.5000 mL | ORAL_SOLUTION | Freq: Four times a day (QID) | ORAL | Status: AC | PRN

## 2011-03-21 MED ORDER — AZITHROMYCIN 250 MG PO TABS: ORAL_TABLET | ORAL | Status: AC

## 2011-03-21 NOTE — Progress Notes (Signed)
  Subjective:    Patient ID: Ashley Lawson, female    DOB: [birth year], 77 y.o.   MRN: [id number]  HPI  77 year old patient who presents with a three-day history of head and chest congestion and productive cough. She states that she has developed worsening sinus pain and congestion as well as productive cough yielding green sputum. Last night her fever was 101. She states that she has had sinus infections and has required the patient from eyes and for treatment.  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 Negative for chills.  HENT: Positive for congestion. Negative for hearing loss, sore throat, rhinorrhea, dental problem, sinus pressure and tinnitus.   Eyes: Negative for pain, discharge and visual disturbance.  Respiratory: Positive for cough. Negative for shortness of breath.   Cardiovascular: Negative for chest pain, palpitations and leg swelling.  Gastrointestinal: Negative for nausea, vomiting, abdominal pain, diarrhea, constipation, blood in stool and abdominal distention.  Genitourinary: Negative for dysuria, urgency, frequency, hematuria, flank pain, vaginal bleeding, vaginal discharge, difficulty urinating, vaginal pain and pelvic pain.  Musculoskeletal: Negative for joint swelling, arthralgias and gait problem.  Skin: Negative for rash.  Neurological: Negative for dizziness, syncope, speech difficulty, weakness, numbness and headaches.  Hematological: Negative for adenopathy.  Psychiatric/Behavioral: Negative for behavioral problems, dysphoric mood and agitation. The patient is not nervous/anxious.        Objective:   Physical Exam  Constitutional: She is oriented to person, place, and time. She appears well-developed and well-nourished. No distress.  HENT:  Head: Normocephalic.  Right Ear: External ear normal.  Left Ear: External ear normal.  Mouth/Throat: Oropharynx is clear  and moist.        Mild maxillary sinus tenderness  Eyes: Conjunctivae and EOM are normal. Pupils are equal, round, and reactive to light.  Neck: Normal range of motion. Neck supple. No thyromegaly present.  Cardiovascular: Normal rate, regular rhythm, normal heart sounds and intact distal pulses.   Pulmonary/Chest: Effort normal and breath sounds normal. No respiratory distress. She has no wheezes.  Abdominal: Soft. Bowel sounds are normal. She exhibits no mass. There is no tenderness.  Musculoskeletal: Normal range of motion.  Lymphadenopathy:    She has no cervical adenopathy.  Neurological: She is alert and oriented to person, place, and time.  Skin: Skin is warm and dry. No rash noted.  Psychiatric: She has a normal mood and affect. Her behavior is normal.          Assessment & Plan:    URI. Suspect viral etiology at this time. We'll treat symptomatically. With the upcoming holidays we'll give a prescription for a Z-Pak which she'll take if there is any clinical worsening

## 2011-03-21 NOTE — Patient Instructions (Signed)
Get plenty of rest, Drink lots of  clear liquids, and use Tylenol or ibuprofen for fever and discomfort.    Call or return to clinic prn if these symptoms worsen or fail to improve as anticipated.

## 2011-05-17 ENCOUNTER — Telehealth: Payer: Self-pay | Admitting: *Deleted

## 2011-05-17 MED ORDER — ALPRAZOLAM 0.25 MG PO TABS: 0.2500 mg | ORAL_TABLET | Freq: Every evening | ORAL | Status: DC | PRN

## 2011-05-17 NOTE — Telephone Encounter (Signed)
Refill on alprazolam 0.84m Last filled on 03/19/11 LOV 02/18/11 NOV 02/19/12

## 2011-05-17 NOTE — Telephone Encounter (Signed)
Per Dr. Regis Bill- ok x 1

## 2011-05-17 NOTE — Telephone Encounter (Signed)
Rx called in 

## 2011-06-19 ENCOUNTER — Telehealth: Payer: Self-pay | Admitting: Internal Medicine

## 2011-06-19 MED ORDER — ALPRAZOLAM 0.25 MG PO TABS: 0.2500 mg | ORAL_TABLET | Freq: Every evening | ORAL | Status: DC | PRN

## 2011-06-19 NOTE — Telephone Encounter (Addendum)
Per Dr. Regis Bill- ov in June and refill #30 with 2 refills.  Pt aware and rx called in

## 2011-06-19 NOTE — Telephone Encounter (Signed)
Pt called and said that Rheumatologist dx pt with Polymyalgia and was prescribed Prednisone. Pt is still taking the Alprazolam 1 pill every night, not prn, to help her relax. Pt just wanted Dr Regis Bill to know why pt has been taking med every night instead of prn. Pt is running low on med and is req refill of  ALPRAZolam (XANAX) 0.25 MG tablet to [facility].

## 2011-07-04 ENCOUNTER — Other Ambulatory Visit: Payer: Self-pay | Admitting: Internal Medicine

## 2011-07-04 DIAGNOSIS — Z1231 Encounter for screening mammogram for malignant neoplasm of breast: Secondary | ICD-10-CM

## 2011-07-30 ENCOUNTER — Ambulatory Visit (HOSPITAL_COMMUNITY)
Admission: RE | Admit: 2011-07-30 | Discharge: 2011-07-30 | Disposition: A | Discharge status: Home / Self Care | Payer: Medicare Other | Source: Ambulatory Visit | Attending: Internal Medicine | Admitting: Internal Medicine

## 2011-07-30 DIAGNOSIS — Z1231 Encounter for screening mammogram for malignant neoplasm of breast: Secondary | ICD-10-CM | POA: Insufficient documentation

## 2011-08-30 ENCOUNTER — Ambulatory Visit (INDEPENDENT_AMBULATORY_CARE_PROVIDER_SITE_OTHER): Payer: Medicare Other | Admitting: Internal Medicine

## 2011-08-30 ENCOUNTER — Encounter: Payer: Self-pay | Admitting: Internal Medicine

## 2011-08-30 VITALS — BP 138/80 | HR 97 | Temp 98.1°F | Wt 176.0 lb

## 2011-08-30 DIAGNOSIS — W540XXA Bitten by dog, initial encounter: Secondary | ICD-10-CM

## 2011-08-30 DIAGNOSIS — T148XXA Other injury of unspecified body region, initial encounter: Secondary | ICD-10-CM

## 2011-08-30 DIAGNOSIS — Z23 Encounter for immunization: Secondary | ICD-10-CM

## 2011-08-30 DIAGNOSIS — F411 Generalized anxiety disorder: Secondary | ICD-10-CM

## 2011-08-30 DIAGNOSIS — M353 Polymyalgia rheumatica: Secondary | ICD-10-CM

## 2011-08-30 HISTORY — DX: Polymyalgia rheumatica: M35.3

## 2011-08-30 HISTORY — DX: Bitten by dog, initial encounter: W54.0XXA

## 2011-08-30 MED ORDER — DOXYCYCLINE HYCLATE 100 MG PO CAPS: 100.0000 mg | ORAL_CAPSULE | Freq: Two times a day (BID) | ORAL | Status: AC

## 2011-08-30 NOTE — Patient Instructions (Addendum)
I am not sure that this is a minor bite or scratch abrasion.  Based on your history I do not think you're at risk of rabies because the dog was immunized. And also this was not particularly abnormal behavior. However I would have you or your daughter called the vet of the dog involved And report that you have a minor abrasion or bite  and confirmed that  you are not at risk for rabies.  In the meantime keep the area clean use over-the-counter Polysporin antibiotic ointment if there is increasing redness or swelling add the oral antibiotic for possible early infection Tdap today.  Will followup next week after checkup.

## 2011-08-30 NOTE — Progress Notes (Signed)
  Subjective:    Patient ID: Ashley Lawson, female    DOB: 1932/08/20, 78 y.o.   MRN: [id number]  HPI Patient comes in today for SDA for  new problem evaluation. Yesterday she was walking her dog and a neighbor dog ran out an open gait and try to interact with her and small dog who she picked up. In the midst of this the other dog either bit or scratched her on the back of the right leg. Since that time it was noted that rabies vaccine was updated on the other dog up until 2016 but had had some kind of surgery months before because of the bite from a raccoon that might have been rabid.She did clean the wound right away it isn't bothering her that much but is difficult for her to see she hasn't had a tetanus shot in many years. She still under treatment for PMR on low-dose 5 mg prednisone. Goes to the pool with exercise and still has some achiness. Review of Systems Negative for chest pain shortness of breath tends to get anxious easily he has IBS. Past history family history social history reviewed in the electronic medical record. Outpatient Encounter Prescriptions as of 08/30/2011  Medication Sig Dispense Refill  . ALPRAZolam (XANAX) 0.25 MG tablet Take 1 tablet (0.25 mg total) by mouth at bedtime as needed.  30 tablet  2  . Ascorbic Acid (VITAMIN C) 100 MG tablet Take 100 mg by mouth daily.        . calcium-vitamin D (OSCAL WITH D) 500-200 MG-UNIT per tablet Take 1 tablet by mouth daily.        . cholecalciferol (VITAMIN D) 1000 UNITS tablet Take 1,000 Units by mouth daily.        . fish oil-omega-3 fatty acids 1000 MG capsule Take 2 g by mouth daily.        . MULTIPLE VITAMIN PO Take by mouth.        .     0        Objective:   Physical Exam Pulse 97  Temp(Src) 98.1 F (36.7 C) (Oral)  Wt 176 lb (79.833 kg)  SpO2 97% Well-developed well-nourished in no acute distress mildly anxious. Examination of lower extremities right posterior Calf popliteal area with a 2 cm area of a  superficial linear scratch versus bite without deep puncture area some mild redness surrounding the area. No obvious bruising. Gait is normal for her.     Assessment & Plan:  Dog bite versus scratch Patient concerned about rabies and evaluation I do not think she's risk based on what she is telling me but should contact the vet's office in this regard.  Update Tdap Today local wound care prescription given for doxycycline because she cannot take amoxicillin in case the area gets increased redness over the weekend. Followup next week at her routine checkup.: Call service the meantime if needed  pmr stable  On low dose pred   Total visit 37mns > 50% spent counseling and coordinating care

## 2011-09-03 ENCOUNTER — Ambulatory Visit (INDEPENDENT_AMBULATORY_CARE_PROVIDER_SITE_OTHER): Payer: Medicare Other | Admitting: Internal Medicine

## 2011-09-03 ENCOUNTER — Encounter: Payer: Self-pay | Admitting: Internal Medicine

## 2011-09-03 VITALS — BP 118/82 | HR 92 | Temp 98.5°F | Wt 174.0 lb

## 2011-09-03 DIAGNOSIS — M353 Polymyalgia rheumatica: Secondary | ICD-10-CM

## 2011-09-03 DIAGNOSIS — K589 Irritable bowel syndrome without diarrhea: Secondary | ICD-10-CM

## 2011-09-03 DIAGNOSIS — W540XXA Bitten by dog, initial encounter: Secondary | ICD-10-CM

## 2011-09-03 DIAGNOSIS — M810 Age-related osteoporosis without current pathological fracture: Secondary | ICD-10-CM

## 2011-09-03 DIAGNOSIS — F411 Generalized anxiety disorder: Secondary | ICD-10-CM

## 2011-09-03 DIAGNOSIS — T148XXA Other injury of unspecified body region, initial encounter: Secondary | ICD-10-CM

## 2011-09-03 NOTE — Progress Notes (Signed)
  Subjective:    Patient ID: Ashley Lawson, female    DOB: 1932/10/23, 78 y.o.   MRN: [id number]  HPI Patient comes in for followup of multiple medical problems. Since her last visit her dog bite is improved she did take the doxycycline. In the meantime another dog scratched her leg but it is doing well. GI she takesThe omeprezole using prn  And helps . She has IBS and it acts up at times but is okay today. She sees the orthopedist Dr. Lynann Bologna question about her last bone density scan. No current fractures or falls PMR no change on low dose prednisone. Review of Systems Negative for chest pain shortness of breath falling  See  past history Past history family history social history reviewed in the electronic medical record.     Objective:   Physical Exam BP 118/82  Pulse 92  Temp(Src) 98.5 F (36.9 C) (Oral)  Wt 174 lb (78.926 kg)  SpO2 94%  Well-developed well-nourished in no acute distress less anxious today. HEENT is grossly normal neck supple without masses or bruit Chest CTA BS equal cv s1 s2 no g or m rr Abdomen:  Sof,t normal bowel sounds without hepatosplenomegaly, no guarding rebound or masses no CVA tenderness Skin: normal capillary refill ,turgor , color:  Dated bite scratch behind leg is just a small amount of redness. Healing bruise left upper quadrant she states from the dog attack.     Assessment & Plan:  Osteoporosis.  Reviewed past bone density last one was in 2011. It showed a -2.8  T score in the hip.  She states she has been on Fosamax in the past but is unsure how long she was on it thinks it was less than 5 years. Is not interested in infusions worried about side effects. Did discuss that she was at risk because of age and her PMR and or prednisone use.  Since it has been 2 years we'll recheck a bone density and then make a plan. Discussed importance of calcium vitamin D in the diet or a supplementation. She does have a history of GERD symptoms taking  Prilosec as needed maybe problematic if restarting a bisphosphonate.  Dog bite  improved began antibiotic no other concerns discussed prevention.      Type: Image  Comment: External Document  Document Description: Append Order Summary: Bone Density / [facility] -.6 ls,-2.8 hip  Tell patient her dexa shows osteoporosis in hip . ? if changed from pas. Rec consideration of treatment . schedule ov when convenient to discuss.  Document Description: Append Order Summary: Bone Density / [facility] -.6 ls,-2.8 hip  Pt aware of results and appt made.      Results     Scan on 06/21/2009 12:00 AM by Burnis Medin, MDScan on 06/21/2009 12:00 AM by Burnis Medin, MD   22 mins > 50% spent counseling and coordinating care

## 2011-09-03 NOTE — Patient Instructions (Signed)
Need repeat dexa scan   You  have some risk for  fracture from age and the prednisone.  Finish the antibiotic  .  Consider going back on the fosamax type medicines .  Continue taking vitamin D.  Due for a wellness visit in November.

## 2011-09-12 ENCOUNTER — Ambulatory Visit (INDEPENDENT_AMBULATORY_CARE_PROVIDER_SITE_OTHER)
Admission: RE | Admit: 2011-09-12 | Discharge: 2011-09-12 | Disposition: A | Discharge status: Home / Self Care | Payer: Medicare Other | Source: Ambulatory Visit

## 2011-09-12 DIAGNOSIS — M810 Age-related osteoporosis without current pathological fracture: Secondary | ICD-10-CM

## 2011-09-30 ENCOUNTER — Other Ambulatory Visit: Payer: Self-pay | Admitting: Dermatology

## 2011-10-18 ENCOUNTER — Telehealth: Payer: Self-pay | Admitting: Internal Medicine

## 2011-10-18 NOTE — Telephone Encounter (Signed)
Pt calling to get the results of her Bone Density test done 09/03/11.  Inst note will be sent to [facility]c and she will need to call back 10/21/11.

## 2012-01-31 ENCOUNTER — Encounter: Payer: Self-pay | Admitting: Internal Medicine

## 2012-02-19 ENCOUNTER — Encounter: Payer: Self-pay | Admitting: Internal Medicine

## 2012-02-19 ENCOUNTER — Ambulatory Visit (INDEPENDENT_AMBULATORY_CARE_PROVIDER_SITE_OTHER): Payer: Medicare Other | Admitting: Internal Medicine

## 2012-02-19 VITALS — BP 144/86 | HR 49 | Temp 97.9°F | Ht 60.0 in | Wt 172.0 lb

## 2012-02-19 DIAGNOSIS — J309 Allergic rhinitis, unspecified: Secondary | ICD-10-CM

## 2012-02-19 DIAGNOSIS — M353 Polymyalgia rheumatica: Secondary | ICD-10-CM

## 2012-02-19 DIAGNOSIS — E785 Hyperlipidemia, unspecified: Secondary | ICD-10-CM

## 2012-02-19 DIAGNOSIS — K589 Irritable bowel syndrome without diarrhea: Secondary | ICD-10-CM

## 2012-02-19 DIAGNOSIS — Z13220 Encounter for screening for lipoid disorders: Secondary | ICD-10-CM

## 2012-02-19 DIAGNOSIS — M129 Arthropathy, unspecified: Secondary | ICD-10-CM

## 2012-02-19 DIAGNOSIS — Z0000 Encounter for general adult medical examination without abnormal findings: Secondary | ICD-10-CM

## 2012-02-19 DIAGNOSIS — M810 Age-related osteoporosis without current pathological fracture: Secondary | ICD-10-CM

## 2012-02-19 DIAGNOSIS — M1990 Unspecified osteoarthritis, unspecified site: Secondary | ICD-10-CM

## 2012-02-19 LAB — CBC WITH DIFFERENTIAL/PLATELET
Basophils Absolute: 0 10*3/uL (ref 0.0–0.1)
Eosinophils Absolute: 0.1 10*3/uL (ref 0.0–0.7)
HCT: 40.2 % (ref 36.0–46.0)
Lymphocytes Relative: 27.5 % (ref 12.0–46.0)
Lymphs Abs: 1.7 10*3/uL (ref 0.7–4.0)
MCHC: 33 g/dL (ref 30.0–36.0)
Monocytes Relative: 6.9 % (ref 3.0–12.0)
Platelets: 275 10*3/uL (ref 150.0–400.0)
RDW: 13.2 % (ref 11.5–14.6)

## 2012-02-19 LAB — HEPATIC FUNCTION PANEL
AST: 31 U/L (ref 0–37)
Albumin: 4.1 g/dL (ref 3.5–5.2)
Total Bilirubin: 0.6 mg/dL (ref 0.3–1.2)

## 2012-02-19 LAB — BASIC METABOLIC PANEL
BUN: 8 mg/dL (ref 6–23)
CO2: 28 mEq/L (ref 19–32)
Calcium: 9.5 mg/dL (ref 8.4–10.5)
GFR: 72.75 mL/min (ref >60.00)
Glucose, Bld: 91 mg/dL (ref 70–99)

## 2012-02-19 LAB — LIPID PANEL
Cholesterol: 213 mg/dL — ABNORMAL HIGH (ref 0–200)
HDL: 47.3 mg/dL (ref >39.00)
Total CHOL/HDL Ratio: 5
Triglycerides: 185 mg/dL — ABNORMAL HIGH (ref 0.0–149.0)
VLDL: 37 mg/dL (ref 0.0–40.0)

## 2012-02-19 LAB — LDL CHOLESTEROL, DIRECT: Direct LDL: 131.2 mg/dL

## 2012-02-19 MED ORDER — FLUTICASONE PROPIONATE 50 MCG/ACT NA SUSP: NASAL | Status: DC

## 2012-02-19 NOTE — Patient Instructions (Addendum)
Will notify you  of labs when available. Recheck BP and make sure below 140/90 or thereabouts.  Continue lifestyle intervention healthy eating and exercise . monitoring by dermatology. Use nasal cortisone  Every day  For at least 2 weeks to see if helps the sinus pressure problem .  Then can use daily  For suppression.  consider prolia for osteoporosis treatment to prevent fracture.  Contact us if you wish to begin this.  Due for DEXA 6 2015  consider getting hearing checked if ongoing problem.  Preventive Care for Adults, Female A healthy lifestyle and preventive care can promote health and wellness. Preventive health guidelines for women include the following key practices.  A routine yearly physical is a good way to check with your caregiver about your health and preventive screening. It is a chance to share any concerns and updates on your health, and to receive a thorough exam.  Visit your dentist for a routine exam and preventive care every 6 months. Brush your teeth twice a day and floss once a day. Good oral hygiene prevents tooth decay and gum disease.  The frequency of eye exams is based on your age, health, family medical history, use of contact lenses, and other factors. Follow your caregiver's recommendations for frequency of eye exams.  Eat a healthy diet. Foods like vegetables, fruits, whole grains, low-fat dairy products, and lean protein foods contain the nutrients you need without too many calories. Decrease your intake of foods high in solid fats, added sugars, and salt. Eat the right amount of calories for you.Get information about a proper diet from your caregiver, if necessary.  Regular physical exercise is one of the most important things you can do for your health. Most adults should get at least 150 minutes of moderate-intensity exercise (any activity that increases your heart rate and causes you to sweat) each week. In addition, most adults need muscle-strengthening  exercises on 2 or more days a week.  Maintain a healthy weight. The body mass index (BMI) is a screening tool to identify possible weight problems. It provides an estimate of body fat based on height and weight. Your caregiver can help determine your BMI, and can help you achieve or maintain a healthy weight.For adults 20 years and older:  A BMI below 18.5 is considered underweight.  A BMI of 18.5 to 24.9 is normal.  A BMI of 25 to 29.9 is considered overweight.  A BMI of 30 and above is considered obese.  Maintain normal blood lipids and cholesterol levels by exercising and minimizing your intake of saturated fat. Eat a balanced diet with plenty of fruit and vegetables. Blood tests for lipids and cholesterol should begin at age 45 and be repeated every 5 years. If your lipid or cholesterol levels are high, you are over 50, or you are at high risk for heart disease, you may need your cholesterol levels checked more frequently.Ongoing high lipid and cholesterol levels should be treated with medicines if diet and exercise are not effective.  If you smoke, find out from your caregiver how to quit. If you do not use tobacco, do not start.  If you are pregnant, do not drink alcohol. If you are breastfeeding, be very cautious about drinking alcohol. If you are not pregnant and choose to drink alcohol, do not exceed 1 drink per day. One drink is considered to be 12 ounces (355 mL) of beer, 5 ounces (148 mL) of wine, or 1.5 ounces (44 mL) of liquor.  Avoid use of street drugs. Do not share needles with anyone. Ask for help if you need support or instructions about stopping the use of drugs.  High blood pressure causes heart disease and increases the risk of stroke. Your blood pressure should be checked at least every 1 to 2 years. Ongoing high blood pressure should be treated with medicines if weight loss and exercise are not effective.  If you are 74 to 76 years old, ask your caregiver if you should  take aspirin to prevent strokes.  Diabetes screening involves taking a blood sample to check your fasting blood sugar level. This should be done once every 3 years, after age 72, if you are within normal weight and without risk factors for diabetes. Testing should be considered at a younger age or be carried out more frequently if you are overweight and have at least 1 risk factor for diabetes.  Breast cancer screening is essential preventive care for women. You should practice "breast self-awareness." This means understanding the normal appearance and feel of your breasts and may include breast self-examination. Any changes detected, no matter how small, should be reported to a caregiver. Women in their 42s and 30s should have a clinical breast exam (CBE) by a caregiver as part of a regular health exam every 1 to 3 years. After age 29, women should have a CBE every year. Starting at age 56, women should consider having a mammography (breast X-ray test) every year. Women who have a family history of breast cancer should talk to their caregiver about genetic screening. Women at a high risk of breast cancer should talk to their caregivers about having magnetic resonance imaging (MRI) and a mammography every year.  The Pap test is a screening test for cervical cancer. A Pap test can show cell changes on the cervix that might become cervical cancer if left untreated. A Pap test is a procedure in which cells are obtained and examined from the lower end of the uterus (cervix).  Women should have a Pap test starting at age 23.  Between ages 47 and 23, Pap tests should be repeated every 2 years.  Beginning at age 75, you should have a Pap test every 3 years as long as the past 3 Pap tests have been normal.  Some women have medical problems that increase the chance of getting cervical cancer. Talk to your caregiver about these problems. It is especially important to talk to your caregiver if a new problem  develops soon after your last Pap test. In these cases, your caregiver may recommend more frequent screening and Pap tests.  The above recommendations are the same for women who have or have not gotten the vaccine for human papillomavirus (HPV).  If you had a hysterectomy for a problem that was not cancer or a condition that could lead to cancer, then you no longer need Pap tests. Even if you no longer need a Pap test, a regular exam is a good idea to make sure no other problems are starting.  If you are between ages 81 and 41, and you have had normal Pap tests going back 10 years, you no longer need Pap tests. Even if you no longer need a Pap test, a regular exam is a good idea to make sure no other problems are starting.  If you have had past treatment for cervical cancer or a condition that could lead to cancer, you need Pap tests and screening for cancer for at least  20 years after your treatment.  If Pap tests have been discontinued, risk factors (such as a new sexual partner) need to be reassessed to determine if screening should be resumed.  The HPV test is an additional test that may be used for cervical cancer screening. The HPV test looks for the virus that can cause the cell changes on the cervix. The cells collected during the Pap test can be tested for HPV. The HPV test could be used to screen women aged 35 years and older, and should be used in women of any age who have unclear Pap test results. After the age of 82, women should have HPV testing at the same frequency as a Pap test.  Colorectal cancer can be detected and often prevented. Most routine colorectal cancer screening begins at the age of 69 and continues through age 66. However, your caregiver may recommend screening at an earlier age if you have risk factors for colon cancer. On a yearly basis, your caregiver may provide home test kits to check for hidden blood in the stool. Use of a small camera at the end of a tube, to  directly examine the colon (sigmoidoscopy or colonoscopy), can detect the earliest forms of colorectal cancer. Talk to your caregiver about this at age 68, when routine screening begins. Direct examination of the colon should be repeated every 5 to 10 years through age 77, unless early forms of pre-cancerous polyps or small growths are found.  Hepatitis C blood testing is recommended for all people born from [birth year] through 1963 and any individual with known risks for hepatitis C.  Practice safe sex. Use condoms and avoid high-risk sexual practices to reduce the spread of sexually transmitted infections (STIs). STIs include gonorrhea, chlamydia, syphilis, trichomonas, herpes, HPV, and human immunodeficiency virus (HIV). Herpes, HIV, and HPV are viral illnesses that have no cure. They can result in disability, cancer, and death. Sexually active women aged 69 and younger should be checked for chlamydia. Older women with new or multiple partners should also be tested for chlamydia. Testing for other STIs is recommended if you are sexually active and at increased risk.  Osteoporosis is a disease in which the bones lose minerals and strength with aging. This can result in serious bone fractures. The risk of osteoporosis can be identified using a bone density scan. Women ages 74 and over and women at risk for fractures or osteoporosis should discuss screening with their caregivers. Ask your caregiver whether you should take a calcium supplement or vitamin D to reduce the rate of osteoporosis.  Menopause can be associated with physical symptoms and risks. Hormone replacement therapy is available to decrease symptoms and risks. You should talk to your caregiver about whether hormone replacement therapy is right for you.  Use sunscreen with sun protection factor (SPF) of 30 or more. Apply sunscreen liberally and repeatedly throughout the day. You should seek shade when your shadow is shorter than you. Protect  yourself by wearing long sleeves, pants, a wide-brimmed hat, and sunglasses year round, whenever you are outdoors.  Once a month, do a whole body skin exam, using a mirror to look at the skin on your back. Notify your caregiver of new moles, moles that have irregular borders, moles that are larger than a pencil eraser, or moles that have changed in shape or color.  Stay current with required immunizations.  Influenza. You need a dose every fall (or winter). The composition of the flu vaccine changes each year, so  being vaccinated once is not enough.  Pneumococcal polysaccharide. You need 1 to 2 doses if you smoke cigarettes or if you have certain chronic medical conditions. You need 1 dose at age 62 (or older) if you have never been vaccinated.  Tetanus, diphtheria, pertussis (Tdap, Td). Get 1 dose of Tdap vaccine if you are younger than age 32, are over 53 and have contact with an infant, are a Dietitian, are pregnant, or simply want to be protected from whooping cough. After that, you need a Td booster dose every 10 years. Consult your caregiver if you have not had at least 3 tetanus and diphtheria-containing shots sometime in your life or have a deep or dirty wound.  HPV. You need this vaccine if you are a woman age 28 or younger. The vaccine is given in 3 doses over 6 months.  Measles, mumps, rubella (MMR). You need at least 1 dose of MMR if you were born in 1957 or later. You may also need a second dose.  Meningococcal. If you are age 72 to 46 and a first-year college student living in a residence hall, or have one of several medical conditions, you need to get vaccinated against meningococcal disease. You may also need additional booster doses.  Zoster (shingles). If you are age 6 or older, you should get this vaccine.  Varicella (chickenpox). If you have never had chickenpox or you were vaccinated but received only 1 dose, talk to your caregiver to find out if you need this  vaccine.  Hepatitis A. You need this vaccine if you have a specific risk factor for hepatitis A virus infection or you simply wish to be protected from this disease. The vaccine is usually given as 2 doses, 6 to 18 months apart.  Hepatitis B. You need this vaccine if you have a specific risk factor for hepatitis B virus infection or you simply wish to be protected from this disease. The vaccine is given in 3 doses, usually over 6 months. Preventive Services / Frequency   Ages 58 and over  Blood pressure check.** / Every 1 to 2 years.  Lipid and cholesterol check.** / Every 5 years beginning at age 24.  Clinical breast exam.** / Every year after age 86.  Mammogram.** / Every year beginning at age 43 and continuing for as long as you are in good health. Consult with your caregiver.  Pap test.** / Every 3 years starting at age 33 through age 68 or 4 with a 3 consecutive normal Pap tests. Testing can be stopped between 65 and 70 with 3 consecutive normal Pap tests and no abnormal Pap or HPV tests in the past 10 years.  HPV screening.** / Every 3 years from ages 52 through ages 34 or 59 with a history of 3 consecutive normal Pap tests. Testing can be stopped between 65 and 70 with 3 consecutive normal Pap tests and no abnormal Pap or HPV tests in the past 10 years.  Fecal occult blood test (FOBT) of stool. / Every year beginning at age 27 and continuing until age 52. You may not need to do this test if you get a colonoscopy every 10 years.  Flexible sigmoidoscopy or colonoscopy.** / Every 5 years for a flexible sigmoidoscopy or every 10 years for a colonoscopy beginning at age 75 and continuing until age 62.  Hepatitis C blood test.** / For all people born from [birth year] through 1963 and any individual with known risks for hepatitis C.  Osteoporosis  screening.** / A one-time screening for women ages 47 and over and women at risk for fractures or osteoporosis.  Skin self-exam. /  Monthly.  Influenza immunization.** / Every year.  Pneumococcal polysaccharide immunization.** / 1 dose at age 41 (or older) if you have never been vaccinated.  Tetanus, diphtheria, pertussis (Tdap, Td) immunization. / A one-time dose of Tdap vaccine if you are over 65 and have contact with an infant, are a Dietitian, or simply want to be protected from whooping cough. After that, you need a Td booster dose every 10 years.  Varicella immunization.** / Consult your caregiver.  Meningococcal immunization.** / Consult your caregiver.  Hepatitis A immunization.** / Consult your caregiver. 2 doses, 6 to 18 months apart.  Hepatitis B immunization.** / Check with your caregiver. 3 doses, usually over 6 months. ** Family history and personal history of risk and conditions may change your caregiver's recommendations. Document Released: 05/14/2001 Document Revised: 06/10/2011 Document Reviewed: 08/13/2010 [facility] Patient Information 2013 [place].

## 2012-02-19 NOTE — Progress Notes (Signed)
Chief Complaint  Patient presents with  . Annual Exam    MEDICARE    HPI: Patient comes in today for Preventive Medicare wellness visit . No major injuries, ed visits ,hospitalizations , new medications since last visit. Using tramadol pr feet arthritis pain. Uses xanax ocass poss weekly for anxiety  Makes her joints feel better.  IBS about the same No fracture one sis with fracture  Other non    Hearing:   Some prob with whispered  Voice.   Vision:  No limitations at present .  Safety:  Has smoke detector and wears seat belts.  No firearms. No excess sun exposure. Sees dentist regularly.  Falls:  no  Advance directive :  Reviewed  Has one.  Memory: Felt to be good  , no concern from her or her family.  Depression: No anhedonia unusual crying or depressive symptoms  Nutrition: Eats well balanced diet; adequate calcium and vitamin D. No swallowing chewiing problems.  Injury: no major injuries in the last six months.  Other healthcare providers:  Reviewed today .  Social:  Lives with husband married. No pets.   Preventive parameters: up-to-date on colonoscopy, mammogram, immunizations. Including Tdap and pneumovax.  ADLS:   There are no problems or need for assistance  driving, feeding, obtaining food, dressing, toileting and bathing, managing money using phone. She is independent.  Water exercise group  No tobacco      ROS:  GEN/ HEENT: No fever, significant weight changes sweats headaches vision problems hearing changes,  Nasal congestion right and face pressure when lays down at night hx of allergy CV/ PULM; No chest pain shortness of breath cough, syncope,edema  change in exercise tolerance. GI /GU: No adominal pain, vomiting, change in bowel habits. No blood in the stool. No significant GU symptoms. SKIN/HEME: ,no acute skin rashes suspicious lesions or bleeding. No lymphadenopathy, nodules, masses.  NEURO/ PSYCH:  No neurologic signs such as weakness numbness. No  depression anxiety. IMM/ Allergy: No unusual infections.  Allergy .   REST of 12 system review negative except as per HPI   Past Medical History  Diagnosis Date  . Allergic rhinitis   . Hyperlipidemia   . Osteoporosis     dexa 2011 -2.7 hip nl spine  . IBS (irritable bowel syndrome)     constipation predominant  . Diverticulosis of colon   . Abdominal pain 12/2009    hospitalized    Family History  Problem Relation Age of Onset  . Other Mother     fx hip  . Other Sister     fx hip    History   Social History  . Marital Status: Widowed    Spouse Name: N/A    Number of Children: N/A  . Years of Education: N/A   Social History Main Topics  . Smoking status: Never Smoker   . Smokeless tobacco: None  . Alcohol Use: Yes  . Drug Use:   . Sexually Active:    Other Topics Concern  . None   Social History Narrative   RetiredRegular exercise- yesWidowedAt home with children and GKs HH of 2  [employer] is social and active    Outpatient Encounter Prescriptions as of 02/19/2012  Medication Sig Dispense Refill  . ALPRAZolam (XANAX) 0.25 MG tablet Take 1 tablet (0.25 mg total) by mouth at bedtime as needed.  30 tablet  2  . Ascorbic Acid (VITAMIN C) 100 MG tablet Take 100 mg by mouth daily.        Marland Kitchen  calcium-vitamin D (OSCAL WITH D) 500-200 MG-UNIT per tablet Take 1 tablet by mouth daily.        . cholecalciferol (VITAMIN D) 1000 UNITS tablet Take 1,000 Units by mouth daily.        . fish oil-omega-3 fatty acids 1000 MG capsule Take 2 g by mouth daily.        . MULTIPLE VITAMIN PO Take by mouth.        . traMADol (ULTRAM) 50 MG tablet Take 50 mg by mouth. Take 1-2 tabs every 4-6 hours as needed for pain.      . fluticasone (FLONASE) 50 MCG/ACT nasal spray 2 spray each nostril qd  16 g  3     EXAM:  BP 144/86  Pulse 49  Temp 97.9 F (36.6 C) (Oral)  Ht 5' (1.524 m)  Wt 172 lb (78.019 kg)  BMI 33.59 kg/m2  SpO2 95%  Body mass index is 33.59  kg/(m^2).  Physical Exam: Vital signs reviewed KTG:YBWL is a well-developed well-nourished alert cooperative   female who appears her stated age in no acute distress.  HEENT: normocephalic atraumatic , Eyes: PERRL EOM's full, conjunctiva clear, Nares: paten,t no deformity discharge or tenderness.msome congestion, Ears: no deformity EAC's clear TMs with normal landmarks. Mouth: clear OP, no lesions, edema.  Moist mucous membranes. Dentition in adequate repair. NECK: supple without masses, thyromegaly or bruits. CHEST/PULM:  Clear to auscultation and percussion breath sounds equal no wheeze , rales or rhonchi. No chest wall deformities or tenderness. Breast: normal by inspection . No dimpling, discharge, masses, tenderness or discharge . CV: PMI is nondisplaced, S1 S2 no gallops, murmurs, rubs. Peripheral pulses are full without delay.No JVD .  ABDOMEN: Bowel sounds normal nontender  No guard or rebound, no hepato splenomegal no CVA tenderness.  No hernia. Extremtities:  No clubbing cyanosis or edema, no acute joint swelling or redness no focal atrophy NEURO:  Oriented x3, cranial nerves 3-12 appear to be intact, no obvious focal weakness,gait within normal limits no abnormal reflexes or asymmetrical SKIN: No acute rashes normal turgor, color, no bruising or petechiae. Multiple sun changes arms  Rough spots PSYCH: Oriented, good eye contact, no obvious depression anxiety, cognition and judgment appear normal. LN: no cervical axillary inguinal adenopathy  Lab Results  Component Value Date   WBC 6.4 02/19/2012   HGB 13.3 02/19/2012   HCT 40.2 02/19/2012   PLT 275.0 02/19/2012   GLUCOSE 91 02/19/2012   CHOL 213* 02/19/2012   TRIG 185.0* 02/19/2012   HDL 47.30 02/19/2012   LDLDIRECT 131.2 02/19/2012   ALT 27 02/19/2012   AST 31 02/19/2012   NA 138 02/19/2012   K 4.4 02/19/2012   CL 104 02/19/2012   CREATININE 0.8 02/19/2012   BUN 8 02/19/2012   CO2 28 02/19/2012   TSH 3.42 02/19/2012     ASSESSMENT AND PLAN:  Discussed the following assessment and plan:  1. Visit for preventive health examination  traMADol (ULTRAM) 50 MG tablet, Basic metabolic panel, CBC with Differential, Hepatic function panel, Lipid panel, TSH, Vitamin D 25 hydroxy  2. PMR (polymyalgia rheumatica)  traMADol (ULTRAM) 50 MG tablet, Basic metabolic panel, CBC with Differential, Hepatic function panel, Lipid panel, TSH, Vitamin D 25 hydroxy  3. OSTEOPOROSIS  traMADol (ULTRAM) 50 MG tablet, Basic metabolic panel, CBC with Differential, Hepatic function panel, Lipid panel, TSH, Vitamin D 25 hydroxy   se of bisphosphonate  family hx  consider prolia  HO given call for rx , plan  4. IBS  traMADol (ULTRAM) 50 MG tablet, Basic metabolic panel, CBC with Differential, Hepatic function panel, Lipid panel, TSH, Vitamin D 25 hydroxy  5. Screening, lipid  traMADol (ULTRAM) 50 MG tablet, Basic metabolic panel, CBC with Differential, Hepatic function panel, Lipid panel, TSH, Vitamin D 25 hydroxy  6. HYPERLIPIDEMIA  traMADol (ULTRAM) 50 MG tablet, Basic metabolic panel, CBC with Differential, Hepatic function panel, Lipid panel, TSH, Vitamin D 25 hydroxy  7. ALLERGIC RHINITIS  traMADol (ULTRAM) 50 MG tablet, Basic metabolic panel, CBC with Differential, Hepatic function panel, Lipid panel, TSH, Vitamin D 25 hydroxy  8. Arthritis     feet  ultram per dr Lynann Bologna     Patient Instructions  Will notify you  of labs when available. Recheck BP and make sure below 140/90 or thereabouts.  Continue lifestyle intervention healthy eating and exercise . monitoring by dermatology. Use nasal cortisone  Every day  For at least 2 weeks to see if helps the sinus pressure problem .  Then can use daily  For suppression.  consider prolia for osteoporosis treatment to prevent fracture.  Contact us if you wish to begin this.  Due for DEXA 6 2015  consider getting hearing checked if ongoing problem.  Preventive Care for Adults, Female A  healthy lifestyle and preventive care can promote health and wellness. Preventive health guidelines for women include the following key practices.  A routine yearly physical is a good way to check with your caregiver about your health and preventive screening. It is a chance to share any concerns and updates on your health, and to receive a thorough exam.  Visit your dentist for a routine exam and preventive care every 6 months. Brush your teeth twice a day and floss once a day. Good oral hygiene prevents tooth decay and gum disease.  The frequency of eye exams is based on your age, health, family medical history, use of contact lenses, and other factors. Follow your caregiver's recommendations for frequency of eye exams.  Eat a healthy diet. Foods like vegetables, fruits, whole grains, low-fat dairy products, and lean protein foods contain the nutrients you need without too many calories. Decrease your intake of foods high in solid fats, added sugars, and salt. Eat the right amount of calories for you.Get information about a proper diet from your caregiver, if necessary.  Regular physical exercise is one of the most important things you can do for your health. Most adults should get at least 150 minutes of moderate-intensity exercise (any activity that increases your heart rate and causes you to sweat) each week. In addition, most adults need muscle-strengthening exercises on 2 or more days a week.  Maintain a healthy weight. The body mass index (BMI) is a screening tool to identify possible weight problems. It provides an estimate of body fat based on height and weight. Your caregiver can help determine your BMI, and can help you achieve or maintain a healthy weight.For adults 20 years and older:  A BMI below 18.5 is considered underweight.  A BMI of 18.5 to 24.9 is normal.  A BMI of 25 to 29.9 is considered overweight.  A BMI of 30 and above is considered obese.  Maintain normal blood  lipids and cholesterol levels by exercising and minimizing your intake of saturated fat. Eat a balanced diet with plenty of fruit and vegetables. Blood tests for lipids and cholesterol should begin at age 72 and be repeated every 5 years. If your lipid or cholesterol levels are high,  you are over 50, or you are at high risk for heart disease, you may need your cholesterol levels checked more frequently.Ongoing high lipid and cholesterol levels should be treated with medicines if diet and exercise are not effective.  If you smoke, find out from your caregiver how to quit. If you do not use tobacco, do not start.  If you are pregnant, do not drink alcohol. If you are breastfeeding, be very cautious about drinking alcohol. If you are not pregnant and choose to drink alcohol, do not exceed 1 drink per day. One drink is considered to be 12 ounces (355 mL) of beer, 5 ounces (148 mL) of wine, or 1.5 ounces (44 mL) of liquor.  Avoid use of street drugs. Do not share needles with anyone. Ask for help if you need support or instructions about stopping the use of drugs.  High blood pressure causes heart disease and increases the risk of stroke. Your blood pressure should be checked at least every 1 to 2 years. Ongoing high blood pressure should be treated with medicines if weight loss and exercise are not effective.  If you are 55 to 76 years old, ask your caregiver if you should take aspirin to prevent strokes.  Diabetes screening involves taking a blood sample to check your fasting blood sugar level. This should be done once every 3 years, after age 38, if you are within normal weight and without risk factors for diabetes. Testing should be considered at a younger age or be carried out more frequently if you are overweight and have at least 1 risk factor for diabetes.  Breast cancer screening is essential preventive care for women. You should practice "breast self-awareness." This means understanding the  normal appearance and feel of your breasts and may include breast self-examination. Any changes detected, no matter how small, should be reported to a caregiver. Women in their 14s and 30s should have a clinical breast exam (CBE) by a caregiver as part of a regular health exam every 1 to 3 years. After age 54, women should have a CBE every year. Starting at age 71, women should consider having a mammography (breast X-ray test) every year. Women who have a family history of breast cancer should talk to their caregiver about genetic screening. Women at a high risk of breast cancer should talk to their caregivers about having magnetic resonance imaging (MRI) and a mammography every year.  The Pap test is a screening test for cervical cancer. A Pap test can show cell changes on the cervix that might become cervical cancer if left untreated. A Pap test is a procedure in which cells are obtained and examined from the lower end of the uterus (cervix).  Women should have a Pap test starting at age 61.  Between ages 58 and 13, Pap tests should be repeated every 2 years.  Beginning at age 17, you should have a Pap test every 3 years as long as the past 3 Pap tests have been normal.  Some women have medical problems that increase the chance of getting cervical cancer. Talk to your caregiver about these problems. It is especially important to talk to your caregiver if a new problem develops soon after your last Pap test. In these cases, your caregiver may recommend more frequent screening and Pap tests.  The above recommendations are the same for women who have or have not gotten the vaccine for human papillomavirus (HPV).  If you had a hysterectomy for a problem that  was not cancer or a condition that could lead to cancer, then you no longer need Pap tests. Even if you no longer need a Pap test, a regular exam is a good idea to make sure no other problems are starting.  If you are between ages 60 and 33, and  you have had normal Pap tests going back 10 years, you no longer need Pap tests. Even if you no longer need a Pap test, a regular exam is a good idea to make sure no other problems are starting.  If you have had past treatment for cervical cancer or a condition that could lead to cancer, you need Pap tests and screening for cancer for at least 20 years after your treatment.  If Pap tests have been discontinued, risk factors (such as a new sexual partner) need to be reassessed to determine if screening should be resumed.  The HPV test is an additional test that may be used for cervical cancer screening. The HPV test looks for the virus that can cause the cell changes on the cervix. The cells collected during the Pap test can be tested for HPV. The HPV test could be used to screen women aged 42 years and older, and should be used in women of any age who have unclear Pap test results. After the age of 69, women should have HPV testing at the same frequency as a Pap test.  Colorectal cancer can be detected and often prevented. Most routine colorectal cancer screening begins at the age of 41 and continues through age 50. However, your caregiver may recommend screening at an earlier age if you have risk factors for colon cancer. On a yearly basis, your caregiver may provide home test kits to check for hidden blood in the stool. Use of a small camera at the end of a tube, to directly examine the colon (sigmoidoscopy or colonoscopy), can detect the earliest forms of colorectal cancer. Talk to your caregiver about this at age 80, when routine screening begins. Direct examination of the colon should be repeated every 5 to 10 years through age 52, unless early forms of pre-cancerous polyps or small growths are found.  Hepatitis C blood testing is recommended for all people born from [birth year] through 1963 and any individual with known risks for hepatitis C.  Practice safe sex. Use condoms and avoid high-risk sexual  practices to reduce the spread of sexually transmitted infections (STIs). STIs include gonorrhea, chlamydia, syphilis, trichomonas, herpes, HPV, and human immunodeficiency virus (HIV). Herpes, HIV, and HPV are viral illnesses that have no cure. They can result in disability, cancer, and death. Sexually active women aged 35 and younger should be checked for chlamydia. Older women with new or multiple partners should also be tested for chlamydia. Testing for other STIs is recommended if you are sexually active and at increased risk.  Osteoporosis is a disease in which the bones lose minerals and strength with aging. This can result in serious bone fractures. The risk of osteoporosis can be identified using a bone density scan. Women ages 10 and over and women at risk for fractures or osteoporosis should discuss screening with their caregivers. Ask your caregiver whether you should take a calcium supplement or vitamin D to reduce the rate of osteoporosis.  Menopause can be associated with physical symptoms and risks. Hormone replacement therapy is available to decrease symptoms and risks. You should talk to your caregiver about whether hormone replacement therapy is right for you.  Use sunscreen  with sun protection factor (SPF) of 30 or more. Apply sunscreen liberally and repeatedly throughout the day. You should seek shade when your shadow is shorter than you. Protect yourself by wearing long sleeves, pants, a wide-brimmed hat, and sunglasses year round, whenever you are outdoors.  Once a month, do a whole body skin exam, using a mirror to look at the skin on your back. Notify your caregiver of new moles, moles that have irregular borders, moles that are larger than a pencil eraser, or moles that have changed in shape or color.  Stay current with required immunizations.  Influenza. You need a dose every fall (or winter). The composition of the flu vaccine changes each year, so being vaccinated once is not  enough.  Pneumococcal polysaccharide. You need 1 to 2 doses if you smoke cigarettes or if you have certain chronic medical conditions. You need 1 dose at age 37 (or older) if you have never been vaccinated.  Tetanus, diphtheria, pertussis (Tdap, Td). Get 1 dose of Tdap vaccine if you are younger than age 42, are over 61 and have contact with an infant, are a Dietitian, are pregnant, or simply want to be protected from whooping cough. After that, you need a Td booster dose every 10 years. Consult your caregiver if you have not had at least 3 tetanus and diphtheria-containing shots sometime in your life or have a deep or dirty wound.  HPV. You need this vaccine if you are a woman age 69 or younger. The vaccine is given in 3 doses over 6 months.  Measles, mumps, rubella (MMR). You need at least 1 dose of MMR if you were born in 1957 or later. You may also need a second dose.  Meningococcal. If you are age 65 to 62 and a first-year college student living in a residence hall, or have one of several medical conditions, you need to get vaccinated against meningococcal disease. You may also need additional booster doses.  Zoster (shingles). If you are age 52 or older, you should get this vaccine.  Varicella (chickenpox). If you have never had chickenpox or you were vaccinated but received only 1 dose, talk to your caregiver to find out if you need this vaccine.  Hepatitis A. You need this vaccine if you have a specific risk factor for hepatitis A virus infection or you simply wish to be protected from this disease. The vaccine is usually given as 2 doses, 6 to 18 months apart.  Hepatitis B. You need this vaccine if you have a specific risk factor for hepatitis B virus infection or you simply wish to be protected from this disease. The vaccine is given in 3 doses, usually over 6 months. Preventive Services / Frequency   Ages 19 and over  Blood pressure check.** / Every 1 to 2 years.  Lipid  and cholesterol check.** / Every 5 years beginning at age 36.  Clinical breast exam.** / Every year after age 61.  Mammogram.** / Every year beginning at age 67 and continuing for as long as you are in good health. Consult with your caregiver.  Pap test.** / Every 3 years starting at age 3 through age 35 or 30 with a 3 consecutive normal Pap tests. Testing can be stopped between 65 and 70 with 3 consecutive normal Pap tests and no abnormal Pap or HPV tests in the past 10 years.  HPV screening.** / Every 3 years from ages 65 through ages 88 or 55 with a history  of 3 consecutive normal Pap tests. Testing can be stopped between 65 and 70 with 3 consecutive normal Pap tests and no abnormal Pap or HPV tests in the past 10 years.  Fecal occult blood test (FOBT) of stool. / Every year beginning at age 20 and continuing until age 61. You may not need to do this test if you get a colonoscopy every 10 years.  Flexible sigmoidoscopy or colonoscopy.** / Every 5 years for a flexible sigmoidoscopy or every 10 years for a colonoscopy beginning at age 11 and continuing until age 72.  Hepatitis C blood test.** / For all people born from [birth year] through 1963 and any individual with known risks for hepatitis C.  Osteoporosis screening.** / A one-time screening for women ages 65 and over and women at risk for fractures or osteoporosis.  Skin self-exam. / Monthly.  Influenza immunization.** / Every year.  Pneumococcal polysaccharide immunization.** / 1 dose at age 84 (or older) if you have never been vaccinated.  Tetanus, diphtheria, pertussis (Tdap, Td) immunization. / A one-time dose of Tdap vaccine if you are over 65 and have contact with an infant, are a Dietitian, or simply want to be protected from whooping cough. After that, you need a Td booster dose every 10 years.  Varicella immunization.** / Consult your caregiver.  Meningococcal immunization.** / Consult your caregiver.  Hepatitis A  immunization.** / Consult your caregiver. 2 doses, 6 to 18 months apart.  Hepatitis B immunization.** / Check with your caregiver. 3 doses, usually over 6 months. ** Family history and personal history of risk and conditions may change your caregiver's recommendations. Document Released: 05/14/2001 Document Revised: 06/10/2011 Document Reviewed: 08/13/2010 [facility] Patient Information 2013 [place].      Standley Brooking. Cristin Szatkowski M.D.

## 2012-02-20 LAB — VITAMIN D 25 HYDROXY (VIT D DEFICIENCY, FRACTURES): Vit D, 25-Hydroxy: 52 ng/mL (ref 30–89)

## 2012-03-05 ENCOUNTER — Other Ambulatory Visit: Payer: Self-pay | Admitting: Internal Medicine

## 2012-03-10 ENCOUNTER — Telehealth: Payer: Self-pay | Admitting: Family Medicine

## 2012-03-10 NOTE — Telephone Encounter (Signed)
Ok to refill x 1  

## 2012-03-10 NOTE — Telephone Encounter (Signed)
The pt is requesting a refill.  She was seen last on 02/19/12 for a wellness visit.  Last filled on 06/19/11 #30 with 2 additional refills.  Please advise.  Thanks!!

## 2012-03-10 NOTE — Telephone Encounter (Signed)
Message sent to [facility].  Waiting on a response.

## 2012-03-11 NOTE — Telephone Encounter (Signed)
Called to the pharmacy and left on voicemail. 

## 2012-06-01 ENCOUNTER — Ambulatory Visit: Payer: Medicare Other | Admitting: Internal Medicine

## 2012-06-16 ENCOUNTER — Ambulatory Visit (INDEPENDENT_AMBULATORY_CARE_PROVIDER_SITE_OTHER): Payer: Medicare Other | Admitting: Internal Medicine

## 2012-06-16 ENCOUNTER — Encounter: Payer: Self-pay | Admitting: Internal Medicine

## 2012-06-16 VITALS — BP 124/70 | HR 83 | Temp 98.1°F | Wt 171.0 lb

## 2012-06-16 DIAGNOSIS — J309 Allergic rhinitis, unspecified: Secondary | ICD-10-CM

## 2012-06-16 DIAGNOSIS — K589 Irritable bowel syndrome without diarrhea: Secondary | ICD-10-CM

## 2012-06-16 DIAGNOSIS — M353 Polymyalgia rheumatica: Secondary | ICD-10-CM

## 2012-06-16 DIAGNOSIS — J019 Acute sinusitis, unspecified: Secondary | ICD-10-CM

## 2012-06-16 MED ORDER — CEFUROXIME AXETIL 500 MG PO TABS: 500.0000 mg | ORAL_TABLET | Freq: Two times a day (BID) | ORAL | Status: DC

## 2012-06-16 MED ORDER — FLUTICASONE PROPIONATE 50 MCG/ACT NA SUSP: NASAL | Status: DC

## 2012-06-16 NOTE — Patient Instructions (Signed)
No evidence of pneumonia  He still has some evidence of right sinusitis which could resolve on its own but will change antibiotic and have you add Flonase nose spray every day to decrease swelling congestion and reaction to allergy pollen.  I think it is safe to use this medicine even though you had a rash to amoxicillin.   Expect you to significantly improve in the next 5 days. If you are not ,contact her office for further advice.

## 2012-06-16 NOTE — Progress Notes (Signed)
Chief Complaint  Patient presents with  . Sinus drainage    Started on March 3.  Went to a walk in clinic.  Was treated with doxycycline and hydrocodone hormatropine.  Sx are better but not completely gone.  . Cough  . Sore Throat  . Headache    HPI: Patient comes in today for SDA for  new problem evaluation.  Seen in another urgent care clinic during the bad weather with a week of upper respiratory symptoms and right face pain. She was rx for sinusitis with doxycycline At [employer]  But still coughing and still taking xyrtec   For now and daughter worried about pna.  10 days of antibiotic  Off for almost a week.  She did get significantly better but still has right face pressure and nasal congestion it is now back to normal. Some ear popping Achy but no fever   Ears  Need to pop. No chest pain shortness of breath hemoptysis.  ROS: See pertinent positives and negatives per HPI. He is a past history of sinus infection and allergies wonder if these are coming back. She's been treated with a Z-Pak for the past. It was not recommended by the physician at [facility].  Past Medical History  Diagnosis Date  . Allergic rhinitis   . Hyperlipidemia   . Osteoporosis     dexa 2011 -2.7 hip nl spine  . IBS (irritable bowel syndrome)     constipation predominant  . Diverticulosis of colon   . Abdominal pain 12/2009    hospitalized    Family History  Problem Relation Age of Onset  . Other Mother     fx hip  . Other Sister     fx hip    History   Social History  . Marital Status: Widowed    Spouse Name: N/A    Number of Children: N/A  . Years of Education: N/A   Social History Main Topics  . Smoking status: Never Smoker   . Smokeless tobacco: None  . Alcohol Use: Yes  . Drug Use:   . Sexually Active:    Other Topics Concern  . None   Social History Narrative   Retired   Regular exercise- yes   Widowed   At home with children and GKs    HH of 2     Puppy   Plays cards is social and active     Outpatient Encounter Prescriptions as of 06/16/2012  Medication Sig Dispense Refill  . ALPRAZolam (XANAX) 0.25 MG tablet TAKE 1 TABLET BY MOUTH AT BEDTIME AS NEEDED  30 tablet  0  . Ascorbic Acid (VITAMIN C) 100 MG tablet Take 100 mg by mouth daily.        . calcium-vitamin D (OSCAL WITH D) 500-200 MG-UNIT per tablet Take 1 tablet by mouth daily.        . cholecalciferol (VITAMIN D) 1000 UNITS tablet Take 1,000 Units by mouth daily.        . fish oil-omega-3 fatty acids 1000 MG capsule Take 2 g by mouth daily.        . MULTIPLE VITAMIN PO Take by mouth.        . [DISCONTINUED] traMADol (ULTRAM) 50 MG tablet Take 50 mg by mouth. Take 1-2 tabs every 4-6 hours as needed for pain.      . cefUROXime (CEFTIN) 500 MG tablet Take 1 tablet (500 mg total) by mouth 2 (two) times daily.  14 tablet  0  .  fluticasone (FLONASE) 50 MCG/ACT nasal spray 2 spray each nostril qd  16 g  3  . [DISCONTINUED] fluticasone (FLONASE) 50 MCG/ACT nasal spray 2 spray each nostril qd  16 g  3   No facility-administered encounter medications on file as of 06/16/2012.    EXAM:  BP 124/70  Pulse 83  Temp(Src) 98.1 F (36.7 C) (Oral)  Wt 171 lb (77.565 kg)  BMI 33.4 kg/m2  SpO2 97%  Body mass index is 33.4 kg/(m^2).  GENERAL: vitals reviewed and listed above, alert, oriented, appears well hydrated and in no acute distress she is quite congested but looks pretty well and nontoxic HEENT: atraumatic, conjunctiva  clear, no obvious abnormalities on inspection of external nose and ears TMs intact nares congested face right maxilla mildly tender frontal is clear OP : no lesion edema or exudate no obvious drainage uvula midline NECK: no obvious masses on inspection palpation no significant adenopathy supple LUNGS: clear to auscultation bilaterally, no wheezes, rales or rhonchi, good air movement  CV: HRRR, no clubbing cyanosis or  peripheral edema nl cap refill   MS: moves all  extremities without noticeable focal  abnormality  PSYCH: pleasant and cooperative, no obvious depression or anxiety  ASSESSMENT AND PLAN:  Discussed the following assessment and plan:  Acute sinusitis treated with antibiotics in the past 60 days - doxy helped but still quite congested on left   rx ceftin for 7 day and add flonase   IBS - Plan: fluticasone (FLONASE) 50 MCG/ACT nasal spray  ALLERGIC RHINITIS - Plan: fluticasone (FLONASE) 50 MCG/ACT nasal spray  PMR (polymyalgia rheumatica) - Plan: fluticasone (FLONASE) 50 MCG/ACT nasal spray Only had rash with amox should be ok to take ceftin   Expectant management. -Patient advised to return or notify health care team  if symptoms worsen or persist or new concerns arise.  Patient Instructions  No evidence of pneumonia  He still has some evidence of right sinusitis which could resolve on its own but will change antibiotic and have you add Flonase nose spray every day to decrease swelling congestion and reaction to allergy pollen.  I think it is safe to use this medicine even though you had a rash to amoxicillin.   Expect you to significantly improve in the next 5 days. If you are not ,contact her office for further advice.     Standley Brooking. Panosh M.D.

## 2012-06-22 ENCOUNTER — Ambulatory Visit (INDEPENDENT_AMBULATORY_CARE_PROVIDER_SITE_OTHER): Payer: Medicare Other | Admitting: Podiatry

## 2012-06-22 ENCOUNTER — Encounter: Payer: Self-pay | Admitting: Podiatry

## 2012-06-22 VITALS — BP 158/84 | HR 77 | Ht 61.0 in | Wt 177.0 lb

## 2012-06-22 DIAGNOSIS — L600 Ingrowing nail: Secondary | ICD-10-CM | POA: Insufficient documentation

## 2012-06-22 DIAGNOSIS — M25579 Pain in unspecified ankle and joints of unspecified foot: Secondary | ICD-10-CM

## 2012-06-22 NOTE — Patient Instructions (Addendum)
Seen for ingrown nail both great toe, more painful on right, has more ingrown nail on left.  Today all nails debrided and resected.  Will benefit from ingrown nails surgery on left great toe on both borders. Return in 2 weeks to have ingrown nail surgery on left great toe.

## 2012-06-22 NOTE — Progress Notes (Signed)
Subjective: 79 y.o. year old female patient presents complaining of painful nails. Patient requests ingrown nails to be treated.  She was not able to do water aerobics due to recent sinus infection (since 3rd of March). She is getting over it now.  Review of Systems - General ROS: negative for - chills, fatigue, fever, hot flashes, malaise, night sweats, sleep disturbance, weight gain, weight loss or other acute distress. Recently has gone through Sinus infection since early part of March and still taking antibiotics. She is feeling fine now.   Objective: Dermatologic: Hypertrophic ingrown nail on left great toe.  Right great toe nail is thin and normal shape, but has pain at the lateral border.  Other nails on lesser digits have hard skin build up at nail borders, which gets painful.   Skin is normotrophic without abnormal lesions. Vascular: Left foot Dorsalis Pedis artery was palpable, but all others not palpable. No palpable pedal pulses on right foot. No sign of ischemic lesions or foot pain.  Orthopedic: Large bunion deformity bilateral, asymptomatic. Neurologic: All epicritic and tactile sensations grossly intact.  Positive response to monofilament sensory testing bilateral.  Assessment: Ingrown nail 1 bilateral, L>R, symptomatic without infection.  Treatment: All offending borders debrided. Reviewed available treatment options, removing ingrown nail borders.  Patient will return to have the left great toe nail fixed.

## 2012-06-23 ENCOUNTER — Other Ambulatory Visit: Payer: Self-pay | Admitting: Internal Medicine

## 2012-06-23 DIAGNOSIS — Z1231 Encounter for screening mammogram for malignant neoplasm of breast: Secondary | ICD-10-CM

## 2012-06-24 ENCOUNTER — Telehealth: Payer: Self-pay | Admitting: Internal Medicine

## 2012-06-24 NOTE — Telephone Encounter (Signed)
Ok to do t he hydrocodone hycodan 180 cc 1 tsp every 4-6 hours prn cough no refill .  Can either use otc  Monistat generic cream vaginal   Or  Diflucan 150 disp 1  1 po x 1

## 2012-06-24 NOTE — Telephone Encounter (Signed)
Pt was seen on 06-16-12 and now has cough and needs cough med. Pt has hydrocodone cough med prescribe by another MD on hand. Pt is wondering should she take that cough med. Pt also has yeast inf from taking abx please call something into  [facility] [address]

## 2012-06-24 NOTE — Telephone Encounter (Signed)
Spoke to the pt.  She has some of the same cough syrup on hand (half a bottle) given by another provider 3 weeks ago.  She did not want a new rx sent in.  I gave her the directions on how you want her to take the cough syrup.  She will abide by your directions.  Also, she will go the the pharmacy and get Monistat.  Instructed to call back if sx worsen.

## 2012-07-07 ENCOUNTER — Encounter: Payer: Self-pay | Admitting: Podiatry

## 2012-07-07 ENCOUNTER — Ambulatory Visit (INDEPENDENT_AMBULATORY_CARE_PROVIDER_SITE_OTHER): Payer: Medicare Other | Admitting: Podiatry

## 2012-07-07 VITALS — BP 139/75 | HR 82

## 2012-07-07 DIAGNOSIS — L600 Ingrowing nail: Secondary | ICD-10-CM

## 2012-07-07 NOTE — Progress Notes (Signed)
Patient presents for scheduled ingrown nail surgery. Vital signs checked and noted. Consent form reviewed and signed for Partial matrixectomy right hallux medial border. Procedure performed: Phenol and Alcohol partial matrixectomy right hallux medial border. Procedure done as follows: Affected toe was anesthetized with total 20m mixture of 50/50 0.5% Marcaine plain and 1% Xylocaine plain. Affected nail border was reflected with a nail elevator and excised with nail nipper. Proximal nail matrix tissue was cauterized with Phenol soaked cotton applicator x 4 and neutralized with Alcohol soaked cotton applicator. The wound was dressed with Amerigel ointment dressing. Home care instructions and supply dispensed.  Return in 1 week for follow up.

## 2012-07-14 ENCOUNTER — Ambulatory Visit (INDEPENDENT_AMBULATORY_CARE_PROVIDER_SITE_OTHER): Payer: Medicare Other | Admitting: Podiatry

## 2012-07-14 ENCOUNTER — Encounter: Payer: Self-pay | Admitting: Podiatry

## 2012-07-14 VITALS — BP 155/75 | HR 69 | Ht 60.0 in | Wt 170.0 lb

## 2012-07-14 DIAGNOSIS — Z9889 Other specified postprocedural states: Secondary | ICD-10-CM

## 2012-07-14 NOTE — Patient Instructions (Addendum)
Seen for post op nail. Nail is clean and dry. Continue to soak till pain subside. Return as needed.

## 2012-07-14 NOTE — Progress Notes (Signed)
Seen for post op visit. 1 week following matrixectomy left hallux. Patient denies having any problem with the toe. Surgical site clean and dry. Some remnant redness at ungual labia. P: Continue to soak till the redness subside, may be for another week. Return as needed.

## 2012-07-30 ENCOUNTER — Ambulatory Visit (HOSPITAL_COMMUNITY)
Admission: RE | Admit: 2012-07-30 | Discharge: 2012-07-30 | Disposition: A | Discharge status: Home / Self Care | Payer: Medicare Other | Source: Ambulatory Visit | Attending: Internal Medicine | Admitting: Internal Medicine

## 2012-07-30 DIAGNOSIS — Z1231 Encounter for screening mammogram for malignant neoplasm of breast: Secondary | ICD-10-CM | POA: Insufficient documentation

## 2012-08-04 ENCOUNTER — Other Ambulatory Visit: Payer: Self-pay | Admitting: Internal Medicine

## 2012-08-06 NOTE — Telephone Encounter (Signed)
Last filled on 03/05/12 #30 with 0 additional refills Last seen acutely on 06/16/12 No follow up scheduled

## 2012-08-07 NOTE — Telephone Encounter (Signed)
Ok to refill x 1  

## 2012-10-12 ENCOUNTER — Other Ambulatory Visit: Payer: Self-pay | Admitting: Dermatology

## 2013-01-19 ENCOUNTER — Ambulatory Visit (INDEPENDENT_AMBULATORY_CARE_PROVIDER_SITE_OTHER): Payer: Medicare Other | Admitting: Internal Medicine

## 2013-01-19 ENCOUNTER — Encounter: Payer: Self-pay | Admitting: Internal Medicine

## 2013-01-19 VITALS — BP 130/88 | Temp 98.6°F | Wt 171.0 lb

## 2013-01-19 DIAGNOSIS — R141 Gas pain: Secondary | ICD-10-CM

## 2013-01-19 DIAGNOSIS — IMO0001 Reserved for inherently not codable concepts without codable children: Secondary | ICD-10-CM

## 2013-01-19 DIAGNOSIS — R109 Unspecified abdominal pain: Secondary | ICD-10-CM

## 2013-01-19 DIAGNOSIS — Z23 Encounter for immunization: Secondary | ICD-10-CM

## 2013-01-19 DIAGNOSIS — K589 Irritable bowel syndrome without diarrhea: Secondary | ICD-10-CM

## 2013-01-19 MED ORDER — CILIDINIUM-CHLORDIAZEPOXIDE 2.5-5 MG PO CAPS: 1.0000 | ORAL_CAPSULE | Freq: Two times a day (BID) | ORAL | Status: DC

## 2013-01-19 NOTE — Patient Instructions (Addendum)
Ok to use    Prune juice and  Metamucil   For constipation.   Other meds for spasm could help some  But could cause side effects.   Add   otc  omeprezole   One a  Day  Consider getting ultraousn fo abdomen if not getting better  Also to check on gallbladder .  ROV in 1 month  And if not better may get GI to see you.  Some peoplke do well on a FOD MAP diet with irritable bowel.

## 2013-01-19 NOTE — Progress Notes (Signed)
Chief Complaint  Patient presents with  . Gas    HPI: Patient comes in today for SDA for  new problem evaluation. Hx of vomit like in mouth and for 3 weeks  Sometimes  Alkaseltzer.,  Onset  About 3 weeks ago off an on .  Recurrent problem .   ? If prune juice ok or something  Metamucil.  ROS: See pertinent positives and negatives per HPI. No vomiting no blood  Doing ok othewise has chornic consitaoition  And hx of urinary sx when constipated   Past Medical History  Diagnosis Date  . Allergic rhinitis   . Hyperlipidemia   . Osteoporosis     dexa 2011 -2.7 hip nl spine  . IBS (irritable bowel syndrome)     constipation predominant  . Diverticulosis of colon   . Abdominal pain 12/2009    hospitalized    Family History  Problem Relation Age of Onset  . Other Mother     fx hip  . Other Sister     fx hip    History   Social History  . Marital Status: Widowed    Spouse Name: N/A    Number of Children: N/A  . Years of Education: N/A   Social History Main Topics  . Smoking status: Never Smoker   . Smokeless tobacco: Never Used  . Alcohol Use: Yes  . Drug Use: None  . Sexual Activity: None   Other Topics Concern  . None   Social History Narrative   Retired   Regular exercise- yes   Widowed   At home with children and GKs    HH of 2     Puppy   Plays cards is social and active     Outpatient Encounter Prescriptions as of 01/19/2013  Medication Sig Dispense Refill  . ALPRAZolam (XANAX) 0.25 MG tablet TAKE 1 TABLET AT BEDTIME AS NEEDED  30 tablet  0  . Ascorbic Acid (VITAMIN C) 100 MG tablet Take 100 mg by mouth daily.        . calcium-vitamin D (OSCAL WITH D) 500-200 MG-UNIT per tablet Take 1 tablet by mouth daily.        . cetirizine (ZYRTEC) 10 MG tablet Take 10 mg by mouth daily.      . cholecalciferol (VITAMIN D) 1000 UNITS tablet Take 1,000 Units by mouth daily.        . fish oil-omega-3 fatty acids 1000 MG capsule Take 2 g by mouth daily.        .  fluticasone (FLONASE) 50 MCG/ACT nasal spray 2 spray each nostril qd  16 g  3  . MULTIPLE VITAMIN PO Take by mouth.        . traMADol (ULTRAM) 50 MG tablet Take 50 mg by mouth every 6 (six) hours as needed for pain.      . [DISCONTINUED] cefUROXime (CEFTIN) 500 MG tablet Take 1 tablet (500 mg total) by mouth 2 (two) times daily.  14 tablet  0  . [DISCONTINUED] clidinium-chlordiazePOXIDE (LIBRAX) 5-2.5 MG per capsule Take 1 capsule by mouth 2 (two) times daily. With meal for spasm.  40 capsule  0   No facility-administered encounter medications on file as of 01/19/2013.    EXAM:  BP 130/88  Temp(Src) 98.6 F (37 C) (Oral)  Wt 171 lb (77.565 kg)  BMI 33.4 kg/m2  Body mass index is 33.4 kg/(m^2).  GENERAL: vitals reviewed and listed above, alert, oriented, appears well hydrated and in no acute  distress  HEENT: atraumatic, conjunctiva  clear, no obvious abnormalities on inspection of external nose and ears  NECK: no obvious masses on inspection palpation  LUNGS: clear to auscultation bilaterally, no wheezes, rales or rhonchi, good air movement CV: HRRR, no clubbing cyanosis or  peripheral edema nl cap refill  abd soft  No masses  midl tender colon areas  No masses  . MS: moves all extremities without noticeable focal  abnormality PSYCH: pleasant and cooperative, no obvious depression   ASSESSMENT AND PLAN:  Discussed the following assessment and plan:  Gas - Plan: US Abdomen Complete  IBS (irritable bowel syndrome) - Plan: US Abdomen Complete  Need for prophylactic vaccination and inoculation against influenza - Plan: Flu vaccine HIGH DOSE PF (Fluzone Tri High dose)  Abdominal  pain, other specified site - Plan: US Abdomen Complete prob reflux  Also  To try ranitidine    And not the librax at this time  And keep appt for next month  -Patient advised to return or notify health care team  if symptoms worsen or persist or new concerns arise.  Patient Instructions  Ok to use     Prune juice and  Metamucil   For constipation.   Other meds for spasm could help some  But could cause side effects.   Add   otc  omeprezole   One a  Day  Consider getting ultraousn fo abdomen if not getting better  Also to check on gallbladder .   ROV in 1 month  And if not better may get GI to see you.   Some peoplke do well on a FOD MAP diet with irritable bowel.    Standley Brooking. Lawan Nanez M.D.

## 2013-01-21 ENCOUNTER — Ambulatory Visit
Admission: RE | Admit: 2013-01-21 | Discharge: 2013-01-21 | Disposition: A | Discharge status: Home / Self Care | Payer: Medicare Other | Source: Ambulatory Visit | Attending: Internal Medicine | Admitting: Internal Medicine

## 2013-01-21 DIAGNOSIS — IMO0001 Reserved for inherently not codable concepts without codable children: Secondary | ICD-10-CM

## 2013-01-21 DIAGNOSIS — K589 Irritable bowel syndrome without diarrhea: Secondary | ICD-10-CM

## 2013-01-21 DIAGNOSIS — R109 Unspecified abdominal pain: Secondary | ICD-10-CM

## 2013-01-27 NOTE — Progress Notes (Signed)
Quick Note:  Tell patient that x ray shows no acute abnormality. Some mild fatty liver changes Not causing sx . ______

## 2013-02-23 ENCOUNTER — Ambulatory Visit (INDEPENDENT_AMBULATORY_CARE_PROVIDER_SITE_OTHER): Payer: Medicare Other | Admitting: Internal Medicine

## 2013-02-23 ENCOUNTER — Encounter: Payer: Self-pay | Admitting: Internal Medicine

## 2013-02-23 VITALS — BP 154/80 | HR 68 | Temp 97.4°F | Ht 60.0 in | Wt 168.0 lb

## 2013-02-23 DIAGNOSIS — K5730 Diverticulosis of large intestine without perforation or abscess without bleeding: Secondary | ICD-10-CM

## 2013-02-23 DIAGNOSIS — Z23 Encounter for immunization: Secondary | ICD-10-CM

## 2013-02-23 DIAGNOSIS — F411 Generalized anxiety disorder: Secondary | ICD-10-CM

## 2013-02-23 DIAGNOSIS — E785 Hyperlipidemia, unspecified: Secondary | ICD-10-CM

## 2013-02-23 DIAGNOSIS — K589 Irritable bowel syndrome without diarrhea: Secondary | ICD-10-CM

## 2013-02-23 DIAGNOSIS — Z0000 Encounter for general adult medical examination without abnormal findings: Secondary | ICD-10-CM

## 2013-02-23 DIAGNOSIS — J309 Allergic rhinitis, unspecified: Secondary | ICD-10-CM

## 2013-02-23 LAB — BASIC METABOLIC PANEL
CO2: 27 mEq/L (ref 19–32)
Calcium: 9.3 mg/dL (ref 8.4–10.5)
Creatinine, Ser: 0.8 mg/dL (ref 0.4–1.2)
GFR: 70.54 mL/min (ref >60.00)
Glucose, Bld: 81 mg/dL (ref 70–99)
Sodium: 139 mEq/L (ref 135–145)

## 2013-02-23 LAB — CBC WITH DIFFERENTIAL/PLATELET
Basophils Absolute: 0 10*3/uL (ref 0.0–0.1)
Eosinophils Absolute: 0.1 10*3/uL (ref 0.0–0.7)
Hemoglobin: 13 g/dL (ref 12.0–15.0)
Lymphocytes Relative: 33.8 % (ref 12.0–46.0)
MCHC: 34.1 g/dL (ref 30.0–36.0)
Monocytes Relative: 7.8 % (ref 3.0–12.0)
Neutro Abs: 3.5 10*3/uL (ref 1.4–7.7)
Neutrophils Relative %: 55.9 % (ref 43.0–77.0)
RDW: 13.6 % (ref 11.5–14.6)

## 2013-02-23 LAB — TSH: TSH: 4.59 u[IU]/mL (ref 0.35–5.50)

## 2013-02-23 LAB — LIPID PANEL
HDL: 50.7 mg/dL (ref >39.00)
Total CHOL/HDL Ratio: 4
VLDL: 29 mg/dL (ref 0.0–40.0)

## 2013-02-23 LAB — HEPATIC FUNCTION PANEL
AST: 25 U/L (ref 0–37)
Albumin: 4 g/dL (ref 3.5–5.2)
Alkaline Phosphatase: 67 U/L (ref 39–117)
Bilirubin, Direct: 0.1 mg/dL (ref 0.0–0.3)
Total Bilirubin: 0.5 mg/dL (ref 0.3–1.2)

## 2013-02-23 MED ORDER — ALPRAZOLAM 0.25 MG PO TABS: ORAL_TABLET | ORAL | Status: DC

## 2013-02-23 NOTE — Patient Instructions (Addendum)
Continue lifestyle intervention healthy eating and exercise . Caution with xanax but can use of acute anxiety.    Can cause mental fogginess  No driving or alcohol with this medication Last colon was 2011 per dr Olevia Perches

## 2013-02-23 NOTE — Progress Notes (Signed)
Chief Complaint  Patient presents with  . Medicare Wellness    HPI: Patient comes in today for Preventive Medicare wellness visit . No major injuries, ed visits ,hospitalizations , new medications since last visit. Somewhat better Would like to read he'll of the breast Lamictal sees it very much just when she gets very anxious for a dense. Had restless leg symptoms last night and took a half of a tramadol. Denies significant alcohol regular use. Takes ranitidine for her stomach. Bro passed last week  Doing ok    Hearing: ok  Vision:  No limitations at present . Last eye check UTD  Safety:  Has smoke detector and wears seat belts.  No firearms. No excess sun exposure. Sees dentist regularly.  Falls: no  Advance directive :  Reviewed    Memory: Felt to be good  , no concern from her or her family.  Depression: No anhedonia unusual crying or depressive symptoms  Nutrition: Eats well balanced diet; adequate calcium and vitamin D. No swallowing chewing problems.  Injury: no major injuries in the last six months.  Other healthcare providers:  Reviewed today .  Social:  Lives with daughter  Pet dog    Preventive parameters: up-to-date  Reviewed   ADLS:   There are no problems or need for assistance  driving, feeding, obtaining food, dressing, toileting and bathing, managing money using phone. She is independent.  EXERCISE/ HABITS  Per week   No tobacco    etoh   ROS:  GEN/ HEENT: No fever, significant weight changes sweats headaches vision problems hearing changes, CV/ PULM; No chest pain shortness of breath cough, syncope,edema  change in exercise tolerance. GI /GU: No , vomiting, change in bowel habits. No blood in the stool. No significant GU symptoms. Gets bloated at times with gi sx but stable  SKIN/HEME: ,no acute skin rashes suspicious lesions or bleeding. No lymphadenopathy, nodules, masses.  NEURO/ PSYCH:  No neurologic signs such as weakness numbness. No  depression situational anxiety IMM/ Allergy: No unusual infections.  Allergy .   REST of 12 system review negative except as per HPI   Past Medical History  Diagnosis Date  . Allergic rhinitis   . Hyperlipidemia   . Osteoporosis     dexa 2011 -2.7 hip nl spine  . IBS (irritable bowel syndrome)     constipation predominant  . Diverticulosis of colon   . Abdominal pain 12/2009    hospitalized    Family History  Problem Relation Age of Onset  . Other Mother     fx hip  . Other Sister     fx hip    History   Social History  . Marital Status: Widowed    Spouse Name: N/A    Number of Children: N/A  . Years of Education: N/A   Social History Main Topics  . Smoking status: Never Smoker   . Smokeless tobacco: Never Used  . Alcohol Use: Yes  . Drug Use: None  . Sexual Activity: None   Other Topics Concern  . None   Social History Narrative   Retired   Regular exercise- yes   Widowed   At home with children and GKs    HH of 2     Puppy   Plays cards is social and active     Outpatient Encounter Prescriptions as of 02/23/2013  Medication Sig  . Ascorbic Acid (VITAMIN C) 100 MG tablet Take 100 mg by mouth daily.    Marland Kitchen  calcium-vitamin D (OSCAL WITH D) 500-200 MG-UNIT per tablet Take 1 tablet by mouth daily.    . cetirizine (ZYRTEC) 10 MG tablet Take 10 mg by mouth daily.  . fish oil-omega-3 fatty acids 1000 MG capsule Take 2 g by mouth daily.    . fluticasone (FLONASE) 50 MCG/ACT nasal spray 2 spray each nostril qd  . MULTIPLE VITAMIN PO Take by mouth.    . traMADol (ULTRAM) 50 MG tablet Take 50 mg by mouth every 6 (six) hours as needed for pain.  Marland Kitchen ALPRAZolam (XANAX) 0.25 MG tablet TAKE 1 TABLET AT BEDTIME AS NEEDED  . [DISCONTINUED] ALPRAZolam (XANAX) 0.25 MG tablet TAKE 1 TABLET AT BEDTIME AS NEEDED  . [DISCONTINUED] cholecalciferol (VITAMIN D) 1000 UNITS tablet Take 1,000 Units by mouth daily.      EXAM:  BP 154/80  Pulse 68  Temp(Src) 97.4 F (36.3 C)  (Oral)  Ht 5' (1.524 m)  Wt 168 lb (76.204 kg)  BMI 32.81 kg/m2  SpO2 97%  Body mass index is 32.81 kg/(m^2).  Physical Exam: Vital signs reviewed [id number] is a well-developed well-nourished alert cooperative   who appears stated age in no acute distress.  HEENT: normocephalic atraumatic , Eyes: PERRL EOM's full, conjunctiva clear, Nares: paten,t no deformity discharge or tenderness., Ears: no deformity EAC's clear TMs with normal landmarks. Mouth: clear OP, no lesions, edema.  Moist mucous membranes. Dentition in adequate repair. NECK: supple without masses, thyromegaly or bruits. CHEST/PULM:  Clear to auscultation and percussion breath sounds equal no wheeze , rales or rhonchi. No chest wall deformities or tenderness. Breast: normal by inspection . No dimpling, discharge, masses, tenderness or discharge . CV: PMI is nondisplaced, S1 S2 no gallops, murmurs, rubs. Peripheral pulses are full without delay.No JVD .  ABDOMEN: Bowel sounds normal nontender  No guard or rebound, no hepato splenomegal no CVA tenderness.  No hernia. Extremtities:  No clubbing cyanosis or edema, no acute joint swelling or redness no focal atrophy OA changes   NEURO:  Oriented x3, cranial nerves 3-12 appear to be intact, no obvious focal weakness,gait within normal limits no abnormal reflexes or asymmetrical SKIN: No acute rashes normal turgor, color, no bruising or petechiae. Nancy Fetter and age changes  PSYCH: Oriented, good eye contact, no obvious depression anxiety, cognition and judgment appear normal. LN: no cervical axillary inguinal adenopathy No noted deficits in memory, attention, and speech.   Lab Results  Component Value Date   WBC 6.3 02/23/2013   HGB 13.0 02/23/2013   HCT 38.2 02/23/2013   PLT 273.0 02/23/2013   GLUCOSE 81 02/23/2013   CHOL 206* 02/23/2013   TRIG 145.0 02/23/2013   HDL 50.70 02/23/2013   LDLDIRECT 125.8 02/23/2013   ALT 25 02/23/2013   AST 25 02/23/2013   NA 139 02/23/2013   K 4.2  02/23/2013   CL 106 02/23/2013   CREATININE 0.8 02/23/2013   BUN 11 02/23/2013   CO2 27 02/23/2013   TSH 4.59 02/23/2013    ASSESSMENT AND PLAN:  Discussed the following assessment and plan:  Encounter for Medicare annual wellness exam - Plan: Basic metabolic panel, CBC with Differential, Hepatic function panel, Lipid panel, TSH  Anxiety state, unspecified - cautioned use of  benzos use risk benefit  - Plan: Basic metabolic panel, CBC with Differential, Hepatic function panel, Lipid panel, TSH  IBS (irritable bowel syndrome) - stable  so far can see gi if needed  - Plan: Basic metabolic panel, CBC with Differential, Hepatic function panel, Lipid panel,  TSH  Allergic rhinitis, cause unspecified - Plan: Basic metabolic panel, CBC with Differential, Hepatic function panel, Lipid panel, TSH  Other and unspecified hyperlipidemia - Plan: Basic metabolic panel, CBC with Differential, Hepatic function panel, Lipid panel, TSH  HYPERLIPIDEMIA - lsi - Plan: Basic metabolic panel  DIVERTICULOSIS, COLON - Plan: Basic metabolic panel, CBC with Differential, Hepatic function panel, Lipid panel, TSH  Need for pneumococcal vaccination - Plan: Pneumococcal conjugate vaccine 13-valent Counseled regarding healthy nutrition, exercise, sleep, injury prevention, calcium vit d and healthy weight .fall prevention.  Patient Care Team: Burnis Medin, MD as PCP - General Reece Packer, MD (Urology) Keitha Butte, MD (Dermatology) Roselie Awkward, MD (Ophthalmology) Almedia Balls, MD (Orthopedic Surgery) [facility]  (Rheumatology)  Patient Instructions  Continue lifestyle intervention healthy eating and exercise . Caution with xanax but can use of acute anxiety.    Can cause mental fogginess  No driving or alcohol with this medication Last colon was 2011 per dr Abram Sander K. Panosh M.D.   Health Maintenance  Topic Date Due  . Influenza Vaccine  10/30/2013  .  Mammogram  07/31/2014  . Colonoscopy  08/25/2019  . Tetanus/tdap  08/29/2021  . Pneumococcal Polysaccharide Vaccine Age 74 And Over  Completed  . Zostavax  Completed   Health Maintenance Review  Pre visit review using our clinic review tool, if applicable. No additional management support is needed unless otherwise documented below in the visit note.

## 2013-04-12 ENCOUNTER — Telehealth: Payer: Self-pay | Admitting: Internal Medicine

## 2013-04-12 NOTE — Telephone Encounter (Signed)
Pt states she was seen by Dr. Regis Bill for wellness visit in late November and they discussed stomach issues she was having.  Pt states she was informed to call back if no better so that she could be referred to GI(Dr. Olevia Perches).  Pt states she is not better and would like referral from PCP.

## 2013-04-12 NOTE — Telephone Encounter (Signed)
Please hel[p arrange  A visit or referral if needed to her gi dr Olevia Perches

## 2013-04-13 ENCOUNTER — Other Ambulatory Visit: Payer: Self-pay | Admitting: Family Medicine

## 2013-04-13 DIAGNOSIS — K589 Irritable bowel syndrome without diarrhea: Secondary | ICD-10-CM

## 2013-04-13 DIAGNOSIS — K5730 Diverticulosis of large intestine without perforation or abscess without bleeding: Secondary | ICD-10-CM

## 2013-04-13 NOTE — Telephone Encounter (Signed)
Referral placed in the system. 

## 2013-04-13 NOTE — Telephone Encounter (Signed)
Patient notified

## 2013-04-16 ENCOUNTER — Encounter: Payer: Self-pay | Admitting: Physician Assistant

## 2013-04-20 ENCOUNTER — Other Ambulatory Visit (INDEPENDENT_AMBULATORY_CARE_PROVIDER_SITE_OTHER): Payer: Medicare Other

## 2013-04-20 ENCOUNTER — Encounter: Payer: Self-pay | Admitting: Physician Assistant

## 2013-04-20 ENCOUNTER — Ambulatory Visit (INDEPENDENT_AMBULATORY_CARE_PROVIDER_SITE_OTHER): Payer: Medicare Other | Admitting: Physician Assistant

## 2013-04-20 VITALS — BP 122/80 | HR 86 | Ht 60.0 in | Wt 169.0 lb

## 2013-04-20 DIAGNOSIS — K219 Gastro-esophageal reflux disease without esophagitis: Secondary | ICD-10-CM

## 2013-04-20 DIAGNOSIS — R1084 Generalized abdominal pain: Secondary | ICD-10-CM

## 2013-04-20 LAB — BASIC METABOLIC PANEL
BUN: 7 mg/dL (ref 6–23)
CHLORIDE: 104 meq/L (ref 96–112)
CO2: 27 meq/L (ref 19–32)
CREATININE: 0.9 mg/dL (ref 0.4–1.2)
Calcium: 9.3 mg/dL (ref 8.4–10.5)
GFR: 65.91 mL/min (ref >60.00)
Glucose, Bld: 84 mg/dL (ref 70–99)
POTASSIUM: 4.3 meq/L (ref 3.5–5.1)
Sodium: 138 mEq/L (ref 135–145)

## 2013-04-20 MED ORDER — GLYCOPYRROLATE 2 MG PO TABS: 2.0000 mg | ORAL_TABLET | Freq: Two times a day (BID) | ORAL | Status: DC

## 2013-04-20 NOTE — Progress Notes (Signed)
Subjective:    Patient ID: Ashley Lawson, female    DOB: 14-Sep-1932, 80 y.o.   MRN: [id number]  HPI  Ashley Lawson is a pleasant 80 year old white female known to [name]. She has history of diverticular disease and IBS. She is status post hysterectomy bladder tack and repair of rectocele. She had colonoscopy in May of 2011 showing mild sigmoid diverticulosis otherwise negative exam. She had colonoscopy in 2000 with one hyperplastic polyp removed. She comes in today with complaints of a 6 week history of abdominal pain which she says has  been present on a daily basis. Sh e complains of pain  in both sides of her abdomen. She has also had increasing abdominal gas and bloating and a couple of weeks ago had black stools that lasted for a few days and then resolved. She does not feel that she had taken any Pepto-Bismol. She is not on any regular aspirin or NSAIDs. She also relates a couple of episodes of reflux type symptoms with sour brash and actually regurgitated with these episodes. Generally she does not have any regular heartburn or indigestion. Her appetite has been okay and her weight has been stable. She has Zantac at home but had not been taking this on a regular basis but says it is somewhat helpful when she remembers to take it.    Review of Systems  Constitutional: Negative.   HENT: Negative.   Eyes: Negative.   Respiratory: Negative.   Cardiovascular: Negative.   Gastrointestinal: Positive for abdominal pain and constipation.  Endocrine: Negative.   Genitourinary: Negative.   Musculoskeletal: Negative.   Skin: Negative.   Allergic/Immunologic: Negative.   Neurological: Negative.   Hematological: Negative.   Psychiatric/Behavioral: Negative.    Outpatient Prescriptions Prior to Visit  Medication Sig Dispense Refill  . ALPRAZolam (XANAX) 0.25 MG tablet TAKE 1 TABLET AT BEDTIME AS NEEDED  30 tablet  0  . Ascorbic Acid (VITAMIN C) 100 MG tablet Take 100 mg by mouth daily.         . calcium-vitamin D (OSCAL WITH D) 500-200 MG-UNIT per tablet Take 1 tablet by mouth daily.        . cetirizine (ZYRTEC) 10 MG tablet Take 10 mg by mouth daily.      . fish oil-omega-3 fatty acids 1000 MG capsule Take 2 g by mouth daily.        . fluticasone (FLONASE) 50 MCG/ACT nasal spray 2 spray each nostril qd  16 g  3  . MULTIPLE VITAMIN PO Take by mouth.        . traMADol (ULTRAM) 50 MG tablet Take 50 mg by mouth every 6 (six) hours as needed for pain.       No facility-administered medications prior to visit.   Allergies  Allergen Reactions  . Alendronate Sodium     REACTION: upset stomach  . Amoxicillin Rash    REACTION: unspecified   Patient Active Problem List   Diagnosis Date Noted  . IBS (irritable bowel syndrome)   . Nail, ingrown 06/22/2012  . Acute sinusitis treated with antibiotics in the past 60 days 06/16/2012  . Arthritis 02/19/2012  . Dog bite 08/30/2011  . PMR (polymyalgia rheumatica) 08/30/2011  . Screening, lipid 02/18/2011  . Rash and nonspecific skin eruption 02/18/2011  . Visit for preventive health examination 02/18/2011  . Medicare annual wellness visit, initial 02/18/2011  . Upper arm pain 11/26/2010  . Skin cancer 10/14/2010  . Abdominal bloating 10/08/2010  .  HYPONATREMIA, MILD 01/15/2010  . CONSTIPATION, CHRONIC 01/15/2010  . BLADDER DYSFUNCTION 01/15/2010  . RASH 09/05/2009  . SUPRAPUBIC PAIN 08/01/2009  . ABDOMINAL PAIN, EPIGASTRIC 06/16/2009  . DIVERTICULOSIS, COLON 06/07/2007  . HYPERLIPIDEMIA 06/05/2007  . ANXIETY STATE, UNSPECIFIED 06/05/2007  . ALLERGIC RHINITIS 06/05/2007  . IBS 06/05/2007  . OSTEOPOROSIS 06/05/2007  . XIPHODYNIA 06/05/2007  . ABDOMINAL PAIN, GENERALIZED 06/05/2007   History  Substance Use Topics  . Smoking status: Never Smoker   . Smokeless tobacco: Never Used  . Alcohol Use: Yes   family history includes Bladder Cancer in her brother; Other in her mother and sister.     Objective:   Physical Exam   white female in no acute distress, pleasant blood pressure 122/80 pulse 86 height 5 foot weight 169. HEENT ;nontraumatic normocephalic EOMI PERRLA sclera anicteric, Supple; no JVD, Cardiovascular; regular rate and rhythm with S1-S2 no murmur or gallop, Pulmonary; clear bilaterally, Abdomen ;soft she does have mild tenderness bilateral mid and lower quadrants left greater than right no palpable mass or hepatosplenomegaly bowel sounds are present, Rectal; exam large external hemorrhoidal tags scant stool which is Hemoccult negative, Extremities; no clubbing cyanosis or edema skin warm and dry, Psych; mood and affect appropriate        Assessment & Plan:  #39  81 year old female with history of diverticular disease and IBS who presents with 6 week history of bilateral mid abdominal pain gas and distention. This may be an exacerbation of IBS though she has not been in our office over the past 3-4 years with any IBS-type symptoms. Will rule out other intra-abdominal inflammatory process vs  occult neoplasm . #2 constipation #3 polymyalgia  Rheumatica  Plan; BMET Schedule for CT scan of the abdomen and pelvis with contrast Start Zantac 150 twice daily on a regular basis a.c. breakfast and dinner Start Robinul Forte 2 mg by mouth every morning with second dose in the evening as needed Continue Prune Juice daily and high fiber diet Further plans pending results of CT She will followup with [name] .

## 2013-04-20 NOTE — Progress Notes (Signed)
Reviewed and agree.

## 2013-04-20 NOTE — Patient Instructions (Addendum)
Please go to the basement level to have your labs drawn.  We sent a prescription for Robinul Forte 2 mg ( Glycopyralate ).  Take 1 before breakfast and if  You need to , you can take 1 before dinner for cramping, gas, spasms.    You have been scheduled for a CT scan of the abdomen and pelvis at [facility] ([address]---this is in the same building as Press [facility]).   You are scheduled on 04-22-2013 at 11:30 am . You should arrive at 11:15 am  prior to your appointment time for registration. Please follow the written instructions below on the day of your exam:  WARNING: IF YOU ARE ALLERGIC TO IODINE/X-RAY DYE, PLEASE NOTIFY RADIOLOGY IMMEDIATELY AT [phone]! YOU WILL BE GIVEN A 13 HOUR PREMEDICATION PREP.  1) Do not eat or drink anything after 7:30 am hours prior to your test) 2) You have been given 2 bottles of oral contrast to drink. The solution may taste               better if refrigerated, but do NOT add ice or any other liquid to this solution. Shake             well before drinking.    Drink 1 bottle of contrast @ 9:30 am  (2 hours prior to your exam)  Drink 1 bottle of contrast @ 10:30 am  (1 hour prior to your exam)  You may take any medications as prescribed with a small amount of water except for the following: Metformin, Glucophage, Glucovance, Avandamet, Riomet, Fortamet, Actoplus Met, Janumet, Glumetza or Metaglip. The above medications must be held the day of the exam AND 48 hours after the exam.  The purpose of you drinking the oral contrast is to aid in the visualization of your intestinal tract. The contrast solution may cause some diarrhea. Before your exam is started, you will be given a small amount of fluid to drink. Depending on your individual set of symptoms, you may also receive an intravenous injection of x-ray contrast/dye. Plan on being at [facility] for 30 minutes or long, depending on the type of exam you are having  performed.  If you have any questions regarding your exam or if you need to reschedule, you may call the CT department at [phone] between the hours of 8:00 am and 5:00 pm, Monday-Friday.  ________________________________________________________________________

## 2013-04-22 ENCOUNTER — Ambulatory Visit (INDEPENDENT_AMBULATORY_CARE_PROVIDER_SITE_OTHER)
Admission: RE | Admit: 2013-04-22 | Discharge: 2013-04-22 | Disposition: A | Discharge status: Home / Self Care | Payer: Medicare Other | Source: Ambulatory Visit | Attending: Physician Assistant | Admitting: Physician Assistant

## 2013-04-22 DIAGNOSIS — R1084 Generalized abdominal pain: Secondary | ICD-10-CM

## 2013-04-22 MED ORDER — IOHEXOL 300 MG/ML  SOLN
100.0000 mL | Freq: Once | INTRAMUSCULAR | Status: AC | PRN
  Administered 2013-04-22: 100 mL via INTRAVENOUS

## 2013-04-28 ENCOUNTER — Telehealth: Payer: Self-pay | Admitting: Physician Assistant

## 2013-04-28 NOTE — Telephone Encounter (Signed)
Patient is calling for CT scan results. Please advise.

## 2013-04-29 NOTE — Telephone Encounter (Signed)
Spoke with pt and she is aware.

## 2013-04-29 NOTE — Telephone Encounter (Signed)
Please let pt know the CT scan looks good- no cause for her pain found. Tiny lesion in left kidney ,nonspecific - may want to follow up in 6 months... Apology   for delay- Ct never came in my box

## 2013-05-04 ENCOUNTER — Other Ambulatory Visit: Payer: Self-pay | Admitting: Dermatology

## 2013-05-26 ENCOUNTER — Other Ambulatory Visit: Payer: Self-pay | Admitting: Internal Medicine

## 2013-05-26 NOTE — Telephone Encounter (Signed)
Last seen and filled on 02/23/13 #30 with 0 additional refills At this time she has no future appointment Please advise.  Thanks!

## 2013-05-26 NOTE — Telephone Encounter (Signed)
Ok x 1

## 2013-07-06 ENCOUNTER — Other Ambulatory Visit: Payer: Self-pay | Admitting: Internal Medicine

## 2013-07-06 DIAGNOSIS — Z1231 Encounter for screening mammogram for malignant neoplasm of breast: Secondary | ICD-10-CM

## 2013-08-03 ENCOUNTER — Encounter (INDEPENDENT_AMBULATORY_CARE_PROVIDER_SITE_OTHER): Payer: Self-pay

## 2013-08-03 ENCOUNTER — Ambulatory Visit (HOSPITAL_COMMUNITY)
Admission: RE | Admit: 2013-08-03 | Discharge: 2013-08-03 | Disposition: A | Discharge status: Home / Self Care | Payer: Medicare Other | Source: Ambulatory Visit | Attending: Internal Medicine | Admitting: Internal Medicine

## 2013-08-03 DIAGNOSIS — Z1231 Encounter for screening mammogram for malignant neoplasm of breast: Secondary | ICD-10-CM | POA: Insufficient documentation

## 2013-09-20 ENCOUNTER — Telehealth: Payer: Self-pay | Admitting: *Deleted

## 2013-09-20 DIAGNOSIS — N289 Disorder of kidney and ureter, unspecified: Secondary | ICD-10-CM

## 2013-09-20 NOTE — Telephone Encounter (Signed)
Pt needs follow-up CT for lesion found on left kidney per Nicoletta Ba PA. See phone note dated 04/28/13.   Should be due around July.  Amy Esterwood, PA please, advise if patient needs to have this repeat CT. Per phone note, you thought she may want to repeat.

## 2013-09-21 ENCOUNTER — Telehealth: Payer: Self-pay | Admitting: *Deleted

## 2013-09-21 ENCOUNTER — Encounter: Payer: Self-pay | Admitting: *Deleted

## 2013-09-21 NOTE — Telephone Encounter (Signed)
Yes, would schedule for Ct of Abdomen with contrast - attention to left Kidney-follow up small hypodense lesion found on Ct 04/2013-schedule for July

## 2013-09-21 NOTE — Telephone Encounter (Signed)
Spoke with Rose at [facility] and scheduled abdominal CT on 10/05/13 at 1:30 PM. NPO 4 hours prior and contrast 2 and 1 hour prior. Labs in [facility] for BUN, Crea prior to CT.

## 2013-09-21 NOTE — Telephone Encounter (Signed)
Rose called to see if Nicoletta Ba, PA wants a Ct abdomen renal protocol. Per Nicoletta Ba, PA, yes this will be fine. Per Rose, patient needs to come at 12:45 PM. She does not drink contrast until she comes there. Still will need labs. Patient notified of changes.

## 2013-09-21 NOTE — Telephone Encounter (Signed)
Spoke with patient and gave her appointment date, time and instructions. She will come for labs and to pick up contrast prior to 10/05/13. Written instructions mailed and with contrast up front for pick up.

## 2013-09-27 ENCOUNTER — Other Ambulatory Visit (INDEPENDENT_AMBULATORY_CARE_PROVIDER_SITE_OTHER): Payer: Medicare Other

## 2013-09-27 DIAGNOSIS — N289 Disorder of kidney and ureter, unspecified: Secondary | ICD-10-CM

## 2013-09-27 LAB — CREATININE, SERUM: Creatinine, Ser: 0.8 mg/dL (ref 0.4–1.2)

## 2013-09-27 LAB — BUN: BUN: 9 mg/dL (ref 6–23)

## 2013-10-05 ENCOUNTER — Ambulatory Visit (INDEPENDENT_AMBULATORY_CARE_PROVIDER_SITE_OTHER)
Admission: RE | Admit: 2013-10-05 | Discharge: 2013-10-05 | Disposition: A | Discharge status: Home / Self Care | Payer: Medicare Other | Source: Ambulatory Visit | Attending: Physician Assistant | Admitting: Physician Assistant

## 2013-10-05 DIAGNOSIS — N289 Disorder of kidney and ureter, unspecified: Secondary | ICD-10-CM

## 2013-10-05 MED ORDER — IOHEXOL 350 MG/ML SOLN
100.0000 mL | Freq: Once | INTRAVENOUS | Status: AC | PRN
  Administered 2013-10-05: 100 mL via INTRAVENOUS

## 2013-10-06 ENCOUNTER — Ambulatory Visit (HOSPITAL_COMMUNITY): Payer: Medicare Other

## 2013-11-11 ENCOUNTER — Other Ambulatory Visit: Payer: Self-pay | Admitting: Dermatology

## 2013-12-01 ENCOUNTER — Encounter: Payer: Self-pay | Admitting: Internal Medicine

## 2013-12-01 ENCOUNTER — Other Ambulatory Visit: Payer: Self-pay | Admitting: Internal Medicine

## 2013-12-01 ENCOUNTER — Ambulatory Visit (INDEPENDENT_AMBULATORY_CARE_PROVIDER_SITE_OTHER): Payer: Medicare Other | Admitting: Internal Medicine

## 2013-12-01 VITALS — BP 140/70 | Temp 98.3°F | Ht 60.0 in | Wt 171.0 lb

## 2013-12-01 DIAGNOSIS — N318 Other neuromuscular dysfunction of bladder: Secondary | ICD-10-CM

## 2013-12-01 DIAGNOSIS — Q619 Cystic kidney disease, unspecified: Secondary | ICD-10-CM

## 2013-12-01 DIAGNOSIS — N281 Cyst of kidney, acquired: Secondary | ICD-10-CM

## 2013-12-01 DIAGNOSIS — R351 Nocturia: Secondary | ICD-10-CM

## 2013-12-01 DIAGNOSIS — K589 Irritable bowel syndrome without diarrhea: Secondary | ICD-10-CM

## 2013-12-01 LAB — POCT URINALYSIS DIP (MANUAL ENTRY)
Bilirubin, UA: NEGATIVE
GLUCOSE UA: NEGATIVE
NITRITE UA: NEGATIVE
PROTEIN UA: NEGATIVE
SPEC GRAV UA: 1.015
UROBILINOGEN UA: 0.2
pH, UA: 6

## 2013-12-01 NOTE — Patient Instructions (Addendum)
Get [place] a urine specimen. Doesn't sound like infection. This may be overactive bladder  Or functional problem. Sometimes medication helps sometimes . But they can have side effects .  Will refer you to urology for opinion about this because it has been going on for 6 months .   Urinary Frequency The number of times a normal person urinates depends upon how much liquid they take in and how much liquid they are losing. If the temperature is hot and there is high humidity, then the person will sweat more and usually breathe a little more frequently. These factors decrease the amount of frequency of urination that would be considered normal. The amount you drink is easily determined, but the amount of fluid lost is sometimes more difficult to calculate.  Fluid is lost in two ways:  Sensible fluid loss is usually measured by the amount of urine that you get rid of. Losses of fluid can also occur with diarrhea.  Insensible fluid loss is more difficult to measure. It is caused by evaporation. Insensible loss of fluid occurs through breathing and sweating. It usually ranges from a little less than a quart to a little more than a quart of fluid a day. In normal temperatures and activity levels, the average person may urinate 4 to 7 times in a 24-hour period. Needing to urinate more often than that could indicate a problem. If one urinates 4 to 7 times in 24 hours and has large volumes each time, that could indicate a different problem from one who urinates 4 to 7 times a day and has small volumes. The time of urinating is also important. Most urinating should be done during the waking hours. Getting up at night to urinate frequently can indicate some problems. CAUSES  The bladder is the organ in your lower abdomen that holds urine. Like a balloon, it swells some as it fills up. Your nerves sense this and tell you it is time to head for the bathroom. There are a number of reasons that you might feel the need to  urinate more often than usual. They include:  Urinary tract infection. This is usually associated with other signs such as burning when you urinate.  In men, problems with the prostate (a walnut-size gland that is located near the tube that carries urine out of your body). There are two reasons why the prostate can cause an increased frequency of urination:  An enlarged prostate that does not let the bladder empty well. If the bladder only half empties when you urinate, then it only has half the capacity to fill before you have to urinate again.  The nerves in the bladder become more hypersensitive with an increased size of the prostate even if the bladder empties completely.  Pregnancy.  Obesity. Excess weight is more likely to cause a problem for women than for men.  Bladder stones or other bladder problems.  Caffeine.  Alcohol.  Medications. For example, drugs that help the body get rid of extra fluid (diuretics) increase urine production. Some other medicines must be taken with lots of fluids.  Muscle or nerve weakness. This might be the result of a spinal cord injury, a stroke, multiple sclerosis, or Parkinson disease.  Long-standing diabetes can decrease the sensation of the bladder. This loss of sensation makes it harder to sense the bladder needs to be emptied. Over a period of years, the bladder is stretched out by constant overfilling. This weakens the bladder muscles so that the bladder  does not empty well and has less capacity to fill with new urine.  Interstitial cystitis (also called painful bladder syndrome). This condition develops because the tissues that line the inside of the bladder are inflamed (inflammation is the body's way of reacting to injury or infection). It causes pain and frequent urination. It occurs in women more often than in men. DIAGNOSIS   To decide what might be causing your urinary frequency, your health care provider will probably:  Ask about  symptoms you have noticed.  Ask about your overall health. This will include questions about any medications you are taking.  Do a physical examination.  Order some tests. These might include:  A blood test to check for diabetes or other health issues that could be contributing to the problem.  Urine testing. This could measure the flow of urine and the pressure on the bladder.  A test of your neurological system (the brain, spinal cord, and nerves). This is the system that senses the need to urinate.  A bladder test to check whether it is emptying completely when you urinate.  Cystoscopy. This test uses a thin tube with a tiny camera on it. It offers a look inside your urethra and bladder to see if there are problems.  Imaging tests. You might be given a contrast dye and then asked to urinate. X-rays are taken to see how your bladder is working. TREATMENT  It is important for you to be evaluated to determine if the amount or frequency that you have is unusual or abnormal. If it is found to be abnormal, the cause should be determined and this can usually be found out easily. Depending upon the cause, treatment could include medication, stimulation of the nerves, or surgery. There are not too many things that you can do as an individual to change your urinary frequency. It is important that you balance the amount of fluid intake needed to compensate for your activity and the temperature. Medical problems will be diagnosed and taken care of by your physician. There is no particular bladder training such as Kegel exercises that you can do to help urinary frequency. This is an exercise that is usually recommended for people who have leaking of urine when they laugh, cough, or sneeze. HOME CARE INSTRUCTIONS   Take any medications your health care provider prescribed or suggested. Follow the directions carefully.  Practice any lifestyle changes that are recommended. These might  include:  Drinking less fluid or drinking at different times of the day. If you need to urinate often during the night, for example, you may need to stop drinking fluids early in the evening.  Cutting down on caffeine or alcohol. They both can make you need to urinate more often than normal. Caffeine is found in coffee, tea, and sodas.  Losing weight, if that is recommended.  Keep a journal or a log. You might be asked to record how much you drink and when and where you feel the need to urinate. This will also help evaluate how well the treatment provided by your physician is working. SEEK MEDICAL CARE IF:   Your need to urinate often gets worse.  You feel increased pain or irritation when you urinate.  You notice blood in your urine.  You have questions about any medications that your health care provider recommended.  You notice blood, pus, or swelling at the site of any test or treatment procedure.  You develop a fever of more than 100.54F (38.1C). SEEK  IMMEDIATE MEDICAL CARE IF:  You develop a fever of more than 102.14F (38.9C). Document Released: 01/12/2009 Document Revised: 08/02/2013 Document Reviewed: 01/12/2009 [facility] Patient Information 2015 [place], [place]. This information is not intended to replace advice given to you by your health care provider. Make sure you discuss any questions you have with your health care provider.

## 2013-12-01 NOTE — Addendum Note (Signed)
Addended by: Elmer Picker on: 12/01/2013 05:24 PM   Modules accepted: Orders

## 2013-12-01 NOTE — Progress Notes (Signed)
Pre visit review using our clinic review tool, if applicable. No additional management support is needed unless otherwise documented below in the visit note.  Chief Complaint  Patient presents with  . Urinary Frequency    Happens at night    HPI: Patient comes in today for SDA for   problem evaluation. Co 6 months of increasing nocturia   About 4 times . No other utis sx  ? If emptying bladder in day . Has ibs and had llq discomfort and evaluated earlier this year by Gi and felt to non structural. Has a renal cyst  Followed felt benign. Gets anxious and alprazolam helps pain at one time.  Not taking tramadol  For joints had topical rx by dr Lynann Bologna.  ROS: See pertinent positives and negatives per HPI.? If had an episode of hematuria at one point in past? No fever weight loss systemic sx  Harder to empty bladder when sitting down.  Past Medical History  Diagnosis Date  . Allergic rhinitis   . Hyperlipidemia   . Osteoporosis     dexa 2011 -2.7 hip nl spine  . IBS (irritable bowel syndrome)     constipation predominant  . Diverticulosis of colon   . Abdominal pain 12/2009    hospitalized    Family History  Problem Relation Age of Onset  . Other Mother     fx hip  . Other Sister     fx hip  . Bladder Cancer Brother     History   Social History  . Marital Status: Widowed    Spouse Name: N/A    Number of Children: N/A  . Years of Education: N/A   Social History Main Topics  . Smoking status: Never Smoker   . Smokeless tobacco: Never Used  . Alcohol Use: Yes  . Drug Use: None  . Sexual Activity: None   Other Topics Concern  . None   Social History Narrative   Retired   Regular exercise- yes   Widowed   At home with children and GKs    HH of 2     Puppy   Plays cards is social and active     Outpatient Encounter Prescriptions as of 12/01/2013  Medication Sig  . ALPRAZolam (XANAX) 0.25 MG tablet TAKE 1 TABLET BY MOUTH AT BEDTIME AS NEEDED  . Ascorbic Acid  (VITAMIN C) 100 MG tablet Take 100 mg by mouth daily.    . calcium-vitamin D (OSCAL WITH D) 500-200 MG-UNIT per tablet Take 1 tablet by mouth daily.    . cetirizine (ZYRTEC) 10 MG tablet Take 10 mg by mouth daily.  . fish oil-omega-3 fatty acids 1000 MG capsule Take 2 g by mouth daily.    . fluticasone (FLONASE) 50 MCG/ACT nasal spray 2 spray each nostril qd  . MULTIPLE VITAMIN PO Take by mouth.    . traMADol (ULTRAM) 50 MG tablet Take 50 mg by mouth every 6 (six) hours as needed for pain.  . [DISCONTINUED] glycopyrrolate (ROBINUL) 2 MG tablet Take 1 tablet (2 mg total) by mouth 2 (two) times daily.    EXAM:  BP 140/70  Temp(Src) 98.3 F (36.8 C) (Oral)  Ht 5' (1.524 m)  Wt 171 lb (77.565 kg)  BMI 33.40 kg/m2  Body mass index is 33.4 kg/(m^2).  GENERAL: vitals reviewed and listed above, alert, oriented, appears well hydrated and in no acute distress miuld anxious but non toxic pleasant in nad  HEENT: atraumatic, conjunctiva  clear, no obvious  abnormalities on inspection of external nose and ears  abd soft no masse  ? Sore llq no g or r and no flank pain  MS: moves all extremities without noticeable focal  Abnormality OA changes  PSYCH: pleasant and cooperative,  Lab Results  Component Value Date   WBC 6.3 02/23/2013   HGB 13.0 02/23/2013   HCT 38.2 02/23/2013   PLT 273.0 02/23/2013   GLUCOSE 84 04/20/2013   CHOL 206* 02/23/2013   TRIG 145.0 02/23/2013   HDL 50.70 02/23/2013   LDLDIRECT 125.8 02/23/2013   ALT 25 02/23/2013   AST 25 02/23/2013   NA 138 04/20/2013   K 4.3 04/20/2013   CL 104 04/20/2013   CREATININE 0.8 09/27/2013   BUN 9 09/27/2013   CO2 27 04/20/2013   TSH 4.59 02/23/2013    ASSESSMENT AND PLAN:  Discussed the following assessment and plan:  Nocturia more than twice per night - Plan: POCT urinalysis dipstick, Ambulatory referral to Urology  Renal cyst - Plan: Ambulatory referral to Urology  IBS (irritable bowel syndrome)  BLADDER DYSFUNCTION Unable  to void at office visit will bring back specimen later today  Plan  urology referral  Even if abnormal . BP better on repeat  Anxiety over lay but not causing nocturia!  ? Bladder dysfunction -Patient advised to return or notify health care team  if symptoms worsen ,persist or new concerns arise.  Patient Instructions  Get [place] a urine specimen. Doesn't sound like infection. This may be overactive bladder  Or functional problem. Sometimes medication helps sometimes . But they can have side effects .  Will refer you to urology for opinion about this because it has been going on for 6 months .   Urinary Frequency The number of times a normal person urinates depends upon how much liquid they take in and how much liquid they are losing. If the temperature is hot and there is high humidity, then the person will sweat more and usually breathe a little more frequently. These factors decrease the amount of frequency of urination that would be considered normal. The amount you drink is easily determined, but the amount of fluid lost is sometimes more difficult to calculate.  Fluid is lost in two ways:  Sensible fluid loss is usually measured by the amount of urine that you get rid of. Losses of fluid can also occur with diarrhea.  Insensible fluid loss is more difficult to measure. It is caused by evaporation. Insensible loss of fluid occurs through breathing and sweating. It usually ranges from a little less than a quart to a little more than a quart of fluid a day. In normal temperatures and activity levels, the average person may urinate 4 to 7 times in a 24-hour period. Needing to urinate more often than that could indicate a problem. If one urinates 4 to 7 times in 24 hours and has large volumes each time, that could indicate a different problem from one who urinates 4 to 7 times a day and has small volumes. The time of urinating is also important. Most urinating should be done during the waking hours.  Getting up at night to urinate frequently can indicate some problems. CAUSES  The bladder is the organ in your lower abdomen that holds urine. Like a balloon, it swells some as it fills up. Your nerves sense this and tell you it is time to head for the bathroom. There are a number of reasons that you might feel the need  to urinate more often than usual. They include:  Urinary tract infection. This is usually associated with other signs such as burning when you urinate.  In men, problems with the prostate (a walnut-size gland that is located near the tube that carries urine out of your body). There are two reasons why the prostate can cause an increased frequency of urination:  An enlarged prostate that does not let the bladder empty well. If the bladder only half empties when you urinate, then it only has half the capacity to fill before you have to urinate again.  The nerves in the bladder become more hypersensitive with an increased size of the prostate even if the bladder empties completely.  Pregnancy.  Obesity. Excess weight is more likely to cause a problem for women than for men.  Bladder stones or other bladder problems.  Caffeine.  Alcohol.  Medications. For example, drugs that help the body get rid of extra fluid (diuretics) increase urine production. Some other medicines must be taken with lots of fluids.  Muscle or nerve weakness. This might be the result of a spinal cord injury, a stroke, multiple sclerosis, or Parkinson disease.  Long-standing diabetes can decrease the sensation of the bladder. This loss of sensation makes it harder to sense the bladder needs to be emptied. Over a period of years, the bladder is stretched out by constant overfilling. This weakens the bladder muscles so that the bladder does not empty well and has less capacity to fill with new urine.  Interstitial cystitis (also called painful bladder syndrome). This condition develops because the tissues  that line the inside of the bladder are inflamed (inflammation is the body's way of reacting to injury or infection). It causes pain and frequent urination. It occurs in women more often than in men. DIAGNOSIS   To decide what might be causing your urinary frequency, your health care provider will probably:  Ask about symptoms you have noticed.  Ask about your overall health. This will include questions about any medications you are taking.  Do a physical examination.  Order some tests. These might include:  A blood test to check for diabetes or other health issues that could be contributing to the problem.  Urine testing. This could measure the flow of urine and the pressure on the bladder.  A test of your neurological system (the brain, spinal cord, and nerves). This is the system that senses the need to urinate.  A bladder test to check whether it is emptying completely when you urinate.  Cystoscopy. This test uses a thin tube with a tiny camera on it. It offers a look inside your urethra and bladder to see if there are problems.  Imaging tests. You might be given a contrast dye and then asked to urinate. X-rays are taken to see how your bladder is working. TREATMENT  It is important for you to be evaluated to determine if the amount or frequency that you have is unusual or abnormal. If it is found to be abnormal, the cause should be determined and this can usually be found out easily. Depending upon the cause, treatment could include medication, stimulation of the nerves, or surgery. There are not too many things that you can do as an individual to change your urinary frequency. It is important that you balance the amount of fluid intake needed to compensate for your activity and the temperature. Medical problems will be diagnosed and taken care of by your physician. There is no particular  bladder training such as Kegel exercises that you can do to help urinary frequency. This is an  exercise that is usually recommended for people who have leaking of urine when they laugh, cough, or sneeze. HOME CARE INSTRUCTIONS   Take any medications your health care provider prescribed or suggested. Follow the directions carefully.  Practice any lifestyle changes that are recommended. These might include:  Drinking less fluid or drinking at different times of the day. If you need to urinate often during the night, for example, you may need to stop drinking fluids early in the evening.  Cutting down on caffeine or alcohol. They both can make you need to urinate more often than normal. Caffeine is found in coffee, tea, and sodas.  Losing weight, if that is recommended.  Keep a journal or a log. You might be asked to record how much you drink and when and where you feel the need to urinate. This will also help evaluate how well the treatment provided by your physician is working. SEEK MEDICAL CARE IF:   Your need to urinate often gets worse.  You feel increased pain or irritation when you urinate.  You notice blood in your urine.  You have questions about any medications that your health care provider recommended.  You notice blood, pus, or swelling at the site of any test or treatment procedure.  You develop a fever of more than 100.5F (38.1C). SEEK IMMEDIATE MEDICAL CARE IF:  You develop a fever of more than 102.27F (38.9C). Document Released: 01/12/2009 Document Revised: 08/02/2013 Document Reviewed: 01/12/2009 [facility] Patient Information 2015 [place], [place]. This information is not intended to replace advice given to you by your health care provider. Make sure you discuss any questions you have with your health care provider.      Standley Brooking. Cacey Willow M.D.

## 2013-12-02 ENCOUNTER — Telehealth: Payer: Self-pay | Admitting: Internal Medicine

## 2013-12-02 LAB — URINE CULTURE: Colony Count: 50000

## 2013-12-02 NOTE — Progress Notes (Signed)
Left a message on home and cell for the pt to return my call.

## 2013-12-02 NOTE — Telephone Encounter (Signed)
Ok to refill x 1  

## 2013-12-02 NOTE — Telephone Encounter (Signed)
Pt returning your call about urine results/

## 2013-12-03 ENCOUNTER — Other Ambulatory Visit: Payer: Self-pay | Admitting: Family Medicine

## 2013-12-03 MED ORDER — SULFAMETHOXAZOLE-TRIMETHOPRIM 400-80 MG PO TABS: 1.0000 | ORAL_TABLET | Freq: Two times a day (BID) | ORAL | Status: DC

## 2013-12-03 NOTE — Telephone Encounter (Signed)
See result note.  

## 2013-12-03 NOTE — Telephone Encounter (Signed)
Called to the pharmacy and left on voicemail. 

## 2013-12-07 NOTE — Progress Notes (Signed)
Quick Note:  Tell pt urine culture showed multiple bacteria but not predominant germ. Cannot tell if she has UTI. But ok to take med as given. ______

## 2013-12-17 ENCOUNTER — Other Ambulatory Visit: Payer: Self-pay | Admitting: Dermatology

## 2014-01-10 ENCOUNTER — Other Ambulatory Visit (INDEPENDENT_AMBULATORY_CARE_PROVIDER_SITE_OTHER): Payer: Medicare Other

## 2014-01-10 ENCOUNTER — Encounter: Payer: Medicare Other | Admitting: Internal Medicine

## 2014-01-10 ENCOUNTER — Ambulatory Visit: Payer: Medicare Other | Admitting: Family Medicine

## 2014-01-10 DIAGNOSIS — Z0000 Encounter for general adult medical examination without abnormal findings: Secondary | ICD-10-CM

## 2014-01-10 DIAGNOSIS — E785 Hyperlipidemia, unspecified: Secondary | ICD-10-CM

## 2014-01-10 DIAGNOSIS — F411 Generalized anxiety disorder: Secondary | ICD-10-CM | POA: Diagnosis not present

## 2014-01-10 DIAGNOSIS — Z23 Encounter for immunization: Secondary | ICD-10-CM

## 2014-01-10 LAB — LIPID PANEL
CHOLESTEROL: 237 mg/dL — AB (ref 0–200)
HDL: 46 mg/dL (ref >39.00)
LDL CALC: 153 mg/dL — AB (ref 0–99)
NONHDL: 191
Total CHOL/HDL Ratio: 5
Triglycerides: 189 mg/dL — ABNORMAL HIGH (ref 0.0–149.0)
VLDL: 37.8 mg/dL (ref 0.0–40.0)

## 2014-01-10 LAB — CBC WITH DIFFERENTIAL/PLATELET
Basophils Absolute: 0.1 10*3/uL (ref 0.0–0.1)
Basophils Relative: 1 % (ref 0.0–3.0)
Eosinophils Absolute: 0.2 10*3/uL (ref 0.0–0.7)
Eosinophils Relative: 2.9 % (ref 0.0–5.0)
HCT: 40.4 % (ref 36.0–46.0)
HEMOGLOBIN: 13.4 g/dL (ref 12.0–15.0)
LYMPHS PCT: 38.7 % (ref 12.0–46.0)
Lymphs Abs: 2 10*3/uL (ref 0.7–4.0)
MCHC: 33.2 g/dL (ref 30.0–36.0)
MCV: 93.7 fl (ref 78.0–100.0)
MONOS PCT: 7.5 % (ref 3.0–12.0)
Monocytes Absolute: 0.4 10*3/uL (ref 0.1–1.0)
NEUTROS ABS: 2.6 10*3/uL (ref 1.4–7.7)
NEUTROS PCT: 49.9 % (ref 43.0–77.0)
PLATELETS: 246 10*3/uL (ref 150.0–400.0)
RBC: 4.31 Mil/uL (ref 3.87–5.11)
RDW: 13.6 % (ref 11.5–15.5)
WBC: 5.2 10*3/uL (ref 4.0–10.5)

## 2014-01-10 LAB — TSH: TSH: 4.31 u[IU]/mL (ref 0.35–4.50)

## 2014-01-10 LAB — BASIC METABOLIC PANEL
BUN: 8 mg/dL (ref 6–23)
CO2: 23 meq/L (ref 19–32)
CREATININE: 0.9 mg/dL (ref 0.4–1.2)
Calcium: 9 mg/dL (ref 8.4–10.5)
Chloride: 105 mEq/L (ref 96–112)
GFR: 67.56 mL/min (ref >60.00)
GLUCOSE: 81 mg/dL (ref 70–99)
Potassium: 4.1 mEq/L (ref 3.5–5.1)
SODIUM: 140 meq/L (ref 135–145)

## 2014-01-10 LAB — HEPATIC FUNCTION PANEL
ALT: 25 U/L (ref 0–35)
AST: 28 U/L (ref 0–37)
Albumin: 3.6 g/dL (ref 3.5–5.2)
Alkaline Phosphatase: 63 U/L (ref 39–117)
BILIRUBIN DIRECT: 0.1 mg/dL (ref 0.0–0.3)
TOTAL PROTEIN: 7.6 g/dL (ref 6.0–8.3)
Total Bilirubin: 0.7 mg/dL (ref 0.2–1.2)

## 2014-01-10 NOTE — Addendum Note (Signed)
Addended by: Miles Costain T on: 01/10/2014 10:21 AM   Modules accepted: Orders

## 2014-01-14 ENCOUNTER — Encounter: Payer: Self-pay | Admitting: Internal Medicine

## 2014-01-14 ENCOUNTER — Ambulatory Visit (INDEPENDENT_AMBULATORY_CARE_PROVIDER_SITE_OTHER): Payer: Medicare Other | Admitting: Internal Medicine

## 2014-01-14 VITALS — BP 136/72 | Temp 98.5°F | Ht 60.0 in | Wt 171.0 lb

## 2014-01-14 DIAGNOSIS — K219 Gastro-esophageal reflux disease without esophagitis: Secondary | ICD-10-CM

## 2014-01-14 DIAGNOSIS — K589 Irritable bowel syndrome without diarrhea: Secondary | ICD-10-CM

## 2014-01-14 DIAGNOSIS — E785 Hyperlipidemia, unspecified: Secondary | ICD-10-CM

## 2014-01-14 DIAGNOSIS — J309 Allergic rhinitis, unspecified: Secondary | ICD-10-CM

## 2014-01-14 DIAGNOSIS — M810 Age-related osteoporosis without current pathological fracture: Secondary | ICD-10-CM

## 2014-01-14 DIAGNOSIS — R12 Heartburn: Secondary | ICD-10-CM | POA: Insufficient documentation

## 2014-01-14 DIAGNOSIS — M79673 Pain in unspecified foot: Secondary | ICD-10-CM | POA: Insufficient documentation

## 2014-01-14 DIAGNOSIS — M79672 Pain in left foot: Secondary | ICD-10-CM

## 2014-01-14 DIAGNOSIS — Z0000 Encounter for general adult medical examination without abnormal findings: Secondary | ICD-10-CM

## 2014-01-14 MED ORDER — RANITIDINE HCL 150 MG PO CAPS: 150.0000 mg | ORAL_CAPSULE | Freq: Two times a day (BID) | ORAL | Status: DC

## 2014-01-14 NOTE — Progress Notes (Signed)
Pre visit review using our clinic review tool, if applicable. No additional management support is needed unless otherwise documented below in the visit note.   Chief Complaint  Patient presents with  . Medicare Wellness    heartburn at times    HPI: Patient comes in today for Preventive Medicare wellness visit . Generally well sometimes gets heartburn mostly on the late meals of the day. Does drink tea and Pepsi at times. No vomiting no weight loss no blood in stools. Allergies are about the same. Scan gets trip check every year. Seeing a urologist about every 6 months to make sure things okay. She's excited because she is now a great grandma. Baby is healthy. Think problems with heel pain intermittently usually in the fall so has some calluses between her right lateral toes  Health Maintenance  Topic Date Due  . Influenza Vaccine  10/31/2014  . Mammogram  08/04/2015  . Colonoscopy  08/25/2019  . Tetanus/tdap  08/29/2021  . Pneumococcal Polysaccharide Vaccine Age 36 And Over  Completed  . Zostavax  Completed   Health Maintenance Review LIFESTYLE:  Exercise:  Water aerobics  Feet hurt with walk Tobacco/ETS: no Alcohol: per day  With canasta  Sugar beverages: tea and pepsi Sleep: pretty good  Drug use: no Bone density: none recnetly  Colonoscopy: 4 years ago   Hearing: see screen.   Vision:  No limitations at present . Last eye check UTD  Safety:  Has smoke detector and wears seat belts.  No firearms. No excess sun exposure. Sees dentist regularly.  Falls: no  Advance directive :  Reviewed    Memory: Felt to be good  , no concern from her or her family.  Depression: No anhedonia unusual crying or depressive symptoms  Nutrition: Eats well balanced diet; adequate calcium and vitamin D. No swallowing chewing problems.  Injury: no major injuries in the last six months.  Other healthcare providers:  Reviewed today .  Social:  Lives with daughter and puppy.    Preventive parameters: up-to-date  Reviewed   ADLS:   There are no problems or need for assistance  driving, feeding, obtaining food, dressing, toileting and bathing, managing money using phone. She is independent.   ROS:  GEN/ HEENT: No fever, significant weight changes sweats headaches vision problems hearing changes, CV/ PULM; No chest pain shortness of breath cough, syncope,edema  change in exercise tolerance. GI /GU: No adominal pain, vomiting, change in bowel habits. No blood in the stool. No significant GU symptoms. SKIN/HEME: ,no acute skin rashes suspicious lesions or bleeding. No lymphadenopathy, nodules, masses.  NEURO/ PSYCH:  No neurologic signs such as weakness numbness. No depression anxiety. IMM/ Allergy: No unusual infections.  Allergy .   REST of 12 system review negative except as per HPI   Past Medical History  Diagnosis Date  . Allergic rhinitis   . Hyperlipidemia   . Osteoporosis     dexa 2011 -2.7 hip nl spine  . IBS (irritable bowel syndrome)     constipation predominant  . Diverticulosis of colon   . Abdominal pain 12/2009    hospitalized    Family History  Problem Relation Age of Onset  . Other Mother     fx hip  . Other Sister     fx hip  . Bladder Cancer Brother     History   Social History  . Marital Status: Widowed    Spouse Name: N/A    Number of Children: N/A  .  Years of Education: N/A   Social History Main Topics  . Smoking status: Never Smoker   . Smokeless tobacco: Never Used  . Alcohol Use: Yes  . Drug Use: None  . Sexual Activity: None   Other Topics Concern  . None   Social History Narrative   Retired   Regular exercise- yes   Widowed   At home with children and GKs    HH of 2     Puppy   Plays cards is social and active     Outpatient Encounter Prescriptions as of 01/14/2014  Medication Sig  . ALPRAZolam (XANAX) 0.25 MG tablet TAKE 1 TABLET AT BEDTIME AS NEEDED  . Ascorbic Acid (VITAMIN C) 100 MG tablet  Take 100 mg by mouth daily.    . calcium-vitamin D (OSCAL WITH D) 500-200 MG-UNIT per tablet Take 1 tablet by mouth daily.    . cetirizine (ZYRTEC) 10 MG tablet Take 10 mg by mouth daily.  . fish oil-omega-3 fatty acids 1000 MG capsule Take 2 g by mouth daily.    . fluticasone (FLONASE) 50 MCG/ACT nasal spray 2 spray each nostril qd  . loratadine (CLARITIN) 10 MG tablet Take 10 mg by mouth daily.  . MULTIPLE VITAMIN PO Take by mouth.    . traMADol (ULTRAM) 50 MG tablet Take 50 mg by mouth every 6 (six) hours as needed for pain.  . ranitidine (ZANTAC) 150 MG capsule Take 1 capsule (150 mg total) by mouth 2 (two) times daily. For reflux heartburn  . [DISCONTINUED] sulfamethoxazole-trimethoprim (BACTRIM,SEPTRA) 400-80 MG per tablet Take 1 tablet by mouth 2 (two) times daily.    EXAM:  BP 136/72  Temp(Src) 98.5 F (36.9 C) (Oral)  Ht 5' (1.524 m)  Wt 171 lb (77.565 kg)  BMI 33.40 kg/m2  Body mass index is 33.4 kg/(m^2).  Physical Exam: Vital signs reviewed QIW:LNLG is a well-developed well-nourished alert cooperative   who appears stated age in no acute distress. Essentially normal gait HEENT: normocephalic atraumatic , Eyes: PERRL EOM's full, conjunctiva clear, Nares: paten,t no deformity discharge or tenderness., Ears: no deformity EAC's clear TMs with normal landmarks. Mouth: clear OP, no lesions, edema.  Moist mucous membranes. Dentition in adequate repair. NECK: supple without masses, thyromegaly or bruits. CHEST/PULM:  Clear to auscultation and percussion breath sounds equal no wheeze , rales or rhonchi. No chest wall deformities or tenderness. Breast: normal by inspection . No dimpling, discharge, masses, tenderness or discharge . CV: PMI is nondisplaced, S1 S2 no gallops, murmurs, rubs. Peripheral pulses are full without delay.No JVD .  ABDOMEN: Bowel sounds normal nontender  No guard or rebound, no hepato splenomegal no CVA tenderness.  No hernia. Extremtities:  No clubbing  cyanosis or edema, no acute joint swelling or redness no focal atrophy hand arthritis some spoon deformity > NEURO:  Oriented x3, cranial nerves 3-12 appear to be intact, no obvious focal weakness,gait within normal limits no abnormal reflexes or asymmetrical SKIN: No acute rashes normal turgor, color, no bruising or petechiae. Sun changes  PSYCH: Oriented, good eye contact, no obvious depression anxiety, cognition and judgment appear normal. LN: no cervical axillary inguinal adenopathy No noted deficits in memory, attention, and speech.   Lab Results  Component Value Date   WBC 5.2 01/10/2014   HGB 13.4 01/10/2014   HCT 40.4 01/10/2014   PLT 246.0 01/10/2014   GLUCOSE 81 01/10/2014   CHOL 237* 01/10/2014   TRIG 189.0* 01/10/2014   HDL 46.00 01/10/2014   LDLDIRECT  125.8 02/23/2013   LDLCALC 153* 01/10/2014   ALT 25 01/10/2014   AST 28 01/10/2014   NA 140 01/10/2014   K 4.1 01/10/2014   CL 105 01/10/2014   CREATININE 0.9 01/10/2014   BUN 8 01/10/2014   CO2 23 01/10/2014   TSH 4.31 01/10/2014   Wt Readings from Last 3 Encounters:  01/14/14 171 lb (77.565 kg)  12/01/13 171 lb (77.565 kg)  04/20/13 169 lb (76.658 kg)    ASSESSMENT AND PLAN:  Discussed the following assessment and plan:  Medicare annual wellness visit, subsequent  Hyperlipidemia - lsi diet   Irritable bowel syndrome  Allergic rhinitis, unspecified allergic rhinitis type  Osteoporosis - hx low bd fam hx fx gfet dexa consider other options - Plan: DG Bone Density  Gastroesophageal reflux disease without esophagitis - Discussed dietary change ranitidine prescription twice a day for one to 2 weeks and then as needed  Heel pain, left - Suspect mild plantar fasciitis heel spur discussed local care see Dr. Carloyn Manner take if needed  Patient Care Team: Burnis Medin, MD as PCP - General Reece Packer, MD (Urology) Keitha Butte, MD (Dermatology) Roselie Awkward, MD (Ophthalmology) Almedia Balls, MD (Orthopedic Surgery) Ouida Sills  (Rheumatology) Jarome Matin, MD as Consulting Physician (Dermatology)  Patient Instructions  Try decreasing  sugar caffiene beverages   Processed carbohydrates to help the stomach reflux later in the day.  Will alos help weight control. Dr Diona Browner office can decide if you need another colonoscopy but not until next year anyway . consider another bone density to reassess risk for fracture .  Consider doing prolia  Since you cannot take  fosamax mwedications Check with dr Lynann Bologna about the heel pain . For now inserts  Good fitting shoes . Consider podiatry if needed for the calloused areas.  Healthy lifestyle includes : At least 150 minutes of exercise weeks  , weight at healthy levels, which is usually   BMI 19-25. Avoid trans fats and processed foods;  Increase fresh fruits and veges to 5 servings per day. And avoid sweet beverages including tea and juice. Mediterranean diet with olive oil and nuts have been noted to be heart and brain healthy . Avoid tobacco products . Limit  alcohol to  7 per week for women  Get adequate sleep . Wear seat belts . Don't text and drive .          Standley Brooking. [place] M.D.

## 2014-01-14 NOTE — Patient Instructions (Signed)
Try decreasing  sugar caffiene beverages   Processed carbohydrates to help the stomach reflux later in the day.  Will alos help weight control. Dr Diona Browner office can decide if you need another colonoscopy but not until next year anyway . consider another bone density to reassess risk for fracture .  Consider doing prolia  Since you cannot take  fosamax mwedications Check with dr Lynann Bologna about the heel pain . For now inserts  Good fitting shoes . Consider podiatry if needed for the calloused areas.  Healthy lifestyle includes : At least 150 minutes of exercise weeks  , weight at healthy levels, which is usually   BMI 19-25. Avoid trans fats and processed foods;  Increase fresh fruits and veges to 5 servings per day. And avoid sweet beverages including tea and juice. Mediterranean diet with olive oil and nuts have been noted to be heart and brain healthy . Avoid tobacco products . Limit  alcohol to  7 per week for women  Get adequate sleep . Wear seat belts . Don't text and drive .

## 2014-01-18 ENCOUNTER — Ambulatory Visit (INDEPENDENT_AMBULATORY_CARE_PROVIDER_SITE_OTHER)
Admission: RE | Admit: 2014-01-18 | Discharge: 2014-01-18 | Disposition: A | Discharge status: Home / Self Care | Payer: Medicare Other | Source: Ambulatory Visit | Attending: Internal Medicine | Admitting: Internal Medicine

## 2014-01-18 DIAGNOSIS — M810 Age-related osteoporosis without current pathological fracture: Secondary | ICD-10-CM

## 2014-02-22 ENCOUNTER — Ambulatory Visit (INDEPENDENT_AMBULATORY_CARE_PROVIDER_SITE_OTHER): Payer: Medicare Other | Admitting: Internal Medicine

## 2014-02-22 ENCOUNTER — Encounter: Payer: Self-pay | Admitting: Internal Medicine

## 2014-02-22 VITALS — BP 156/66 | Temp 97.7°F | Ht 60.0 in | Wt 171.0 lb

## 2014-02-22 DIAGNOSIS — M810 Age-related osteoporosis without current pathological fracture: Secondary | ICD-10-CM

## 2014-02-22 NOTE — Patient Instructions (Signed)
You have a family hx of osteoporosis .  And low bone density .Marland Kitchen Continue  Vit d and calcium weight bearing exercise as tolerated .  Consider    prolia with is an injectable   Every 6 months for bone building .  Either way would repeat the bone density  in 2 years.    Osteoporosis Throughout your life, your body breaks down old bone and replaces it with new bone. As you get older, your body does not replace bone as quickly as it breaks it down. By the age of 30 years, most people begin to gradually lose bone because of the imbalance between bone loss and replacement. Some people lose more bone than others. Bone loss beyond a specified normal degree is considered osteoporosis.  Osteoporosis affects the strength and durability of your bones. The inside of the ends of your bones and your flat bones, like the bones of your pelvis, look like honeycomb, filled with tiny open spaces. As bone loss occurs, your bones become less dense. This means that the open spaces inside your bones become bigger and the walls between these spaces become thinner. This makes your bones weaker. Bones of a person with osteoporosis can become so weak that they can break (fracture) during minor accidents, such as a simple fall. CAUSES  The following factors have been associated with the development of osteoporosis:  Smoking.  Drinking more than 2 alcoholic drinks several days per week.  Long-term use of certain medicines:  Corticosteroids.  Chemotherapy medicines.  Thyroid medicines.  Antiepileptic medicines.  Gonadal hormone suppression medicine.  Immunosuppression medicine.  Being underweight.  Lack of physical activity.  Lack of exposure to the sun. This can lead to vitamin D deficiency.  Certain medical conditions:  Certain inflammatory bowel diseases, such as Crohn disease and ulcerative colitis.  Diabetes.  Hyperthyroidism.  Hyperparathyroidism. RISK FACTORS Anyone can develop osteoporosis.  However, the following factors can increase your risk of developing osteoporosis:  Gender--Women are at higher risk than men.  Age--Being older than 50 years increases your risk.  Ethnicity--White and Asian people have an increased risk.  Weight --Being extremely underweight can increase your risk of osteoporosis.  Family history of osteoporosis--Having a family member who has developed osteoporosis can increase your risk. SYMPTOMS  Usually, people with osteoporosis have no symptoms.  DIAGNOSIS  Signs during a physical exam that may prompt your caregiver to suspect osteoporosis include:  Decreased height. This is usually caused by the compression of the bones that form your spine (vertebrae) because they have weakened and become fractured.  A curving or rounding of the upper back (kyphosis). To confirm signs of osteoporosis, your caregiver may request a procedure that uses 2 low-dose X-ray beams with different levels of energy to measure your bone mineral density (dual-energy X-ray absorptiometry [DXA]). Also, your caregiver may check your level of vitamin D. TREATMENT  The goal of osteoporosis treatment is to strengthen bones in order to decrease the risk of bone fractures. There are different types of medicines available to help achieve this goal. Some of these medicines work by slowing the processes of bone loss. Some medicines work by increasing bone density. Treatment also involves making sure that your levels of calcium and vitamin D are adequate. PREVENTION  There are things you can do to help prevent osteoporosis. Adequate intake of calcium and vitamin D can help you achieve optimal bone mineral density. Regular exercise can also help, especially resistance and weight-bearing activities. If you smoke, quitting  smoking is an important part of osteoporosis prevention. MAKE SURE YOU:  Understand these instructions.  Will watch your condition.  Will get help right away if you are  not doing well or get worse. FOR MORE INFORMATION www.osteo.org and EquipmentWeekly.com.ee Document Released: 12/26/2004 Document Revised: 07/13/2012 Document Reviewed: 03/02/2011 [facility] Patient Information 2015 [place], [place]. This information is not intended to replace advice given to you by your health care provider. Make sure you discuss any questions you have with your health care provider.

## 2014-02-22 NOTE — Assessment & Plan Note (Signed)
Reviewed findings options prevention bisphosphonate will probably not be an option for her based on her GI complaints although can always reconsider. Risk-benefit of medicines she will optimize her calcium vitamin D her last vitamin D level was normal no evidence of hyperparathyroidism or secondary cause.  Information from up-to-date about prevention and various medicine classes of treatment she can consider Prolia  and contact us if she wishes to begin this or get more information She has a long-lived family and discussed wanting to avoid a fracture as a cause of debility and disease.

## 2014-02-22 NOTE — Progress Notes (Signed)
Pre visit review using our clinic review tool, if applicable. No additional management support is needed unless otherwise documented below in the visit note.   Chief Complaint  Patient presents with  . Follow-up    HPI: Ashley Lawson  80 y.o. comes  In for .    Go over   dexa results.  She has a history of an ankle fracture and history of Fosamax use in the past for about a year or 2 but she couldn't tolerate it GI wise.  So she stopped and has been taking calcium and vitamin D but maybe only 500 of calcium. She feels pretty well  Her mom had a history of bilateral hip fractures in a later age in her 66s but felt that she didn't take care of herself and was very active however. She has no history of kidney stones and has had thyroid check without disease.  Past Medical History  Diagnosis Date  . Allergic rhinitis   . Hyperlipidemia   . Osteoporosis     dexa 2011 -2.7 hip nl spine  . IBS (irritable bowel syndrome)     constipation predominant  . Diverticulosis of colon   . Abdominal pain 12/2009    hospitalized    Family History  Problem Relation Age of Onset  . Other Mother     fx hip  . Other Sister     fx hip  . Bladder Cancer Brother     History   Social History  . Marital Status: Widowed    Spouse Name: N/A    Number of Children: N/A  . Years of Education: N/A   Social History Main Topics  . Smoking status: Never Smoker   . Smokeless tobacco: Never Used  . Alcohol Use: Yes  . Drug Use: None  . Sexual Activity: None   Other Topics Concern  . None   Social History Narrative   Retired   Regular exercise- yes   Widowed   At home with children and GKs    HH of 2     Puppy   Plays cards is social and active     Outpatient Encounter Prescriptions as of 02/22/2014  Medication Sig  . ALPRAZolam (XANAX) 0.25 MG tablet TAKE 1 TABLET AT BEDTIME AS NEEDED  . Ascorbic Acid (VITAMIN C) 100 MG tablet Take 100 mg by mouth daily.    . calcium-vitamin D (OSCAL  WITH D) 500-200 MG-UNIT per tablet Take 1 tablet by mouth daily.    . cetirizine (ZYRTEC) 10 MG tablet Take 10 mg by mouth daily.  . fish oil-omega-3 fatty acids 1000 MG capsule Take 2 g by mouth daily.    . fluticasone (FLONASE) 50 MCG/ACT nasal spray 2 spray each nostril qd  . loratadine (CLARITIN) 10 MG tablet Take 10 mg by mouth daily.  . MULTIPLE VITAMIN PO Take by mouth.    . ranitidine (ZANTAC) 150 MG capsule Take 1 capsule (150 mg total) by mouth 2 (two) times daily. For reflux heartburn  . traMADol (ULTRAM) 50 MG tablet Take 50 mg by mouth every 6 (six) hours as needed for pain.    EXAM:  BP 156/66 mmHg  Temp(Src) 97.7 F (36.5 C) (Oral)  Ht 5' (1.524 m)  Wt 171 lb (77.565 kg)  BMI 33.40 kg/m2  Body mass index is 33.4 kg/(m^2).  GENERAL: vitals reviewed and listed above, alert, oriented, appears well hydrated and in no acute distress MS: moves all extremities without noticeable focal  abnormality she does have some arthritic changes but independent gait and looks well PSYCH: pleasant and cooperative, no obvious depression or anxiety cognition appears intact Bone density -2.5 right hip -2.7 left hip ASSESSMENT AND PLAN:  Discussed the following assessment and plan:  Osteoporosis - fam hx hip fx did not tolerate fosamax past -2.5 and -2.6 in hip 2015  Reviewed findings options prevention bisphosphonate will probably not be an option for her based on her GI complaints although can always reconsider. Risk-benefit of medicines she will optimize her calcium vitamin D her last vitamin D level was normal no evidence of hyperparathyroidism or secondary cause.  Information from up-to-date about prevention and various medicine classes of treatment she can consider Prolia  and contact us if she wishes to begin this or get more information She has a long-lived family and discussed wanting to avoid a fracture as a cause of debility and disease. -Patient advised to return or notify health  care team  if symptoms worsen ,persist or new concerns arise.  Patient Instructions  You have a family hx of osteoporosis .  And low bone density .Marland Kitchen Continue  Vit d and calcium weight bearing exercise as tolerated .  Consider    prolia with is an injectable   Every 6 months for bone building .  Either way would repeat the bone density  in 2 years.    Osteoporosis Throughout your life, your body breaks down old bone and replaces it with new bone. As you get older, your body does not replace bone as quickly as it breaks it down. By the age of 43 years, most people begin to gradually lose bone because of the imbalance between bone loss and replacement. Some people lose more bone than others. Bone loss beyond a specified normal degree is considered osteoporosis.  Osteoporosis affects the strength and durability of your bones. The inside of the ends of your bones and your flat bones, like the bones of your pelvis, look like honeycomb, filled with tiny open spaces. As bone loss occurs, your bones become less dense. This means that the open spaces inside your bones become bigger and the walls between these spaces become thinner. This makes your bones weaker. Bones of a person with osteoporosis can become so weak that they can break (fracture) during minor accidents, such as a simple fall. CAUSES  The following factors have been associated with the development of osteoporosis:  Smoking.  Drinking more than 2 alcoholic drinks several days per week.  Long-term use of certain medicines:  Corticosteroids.  Chemotherapy medicines.  Thyroid medicines.  Antiepileptic medicines.  Gonadal hormone suppression medicine.  Immunosuppression medicine.  Being underweight.  Lack of physical activity.  Lack of exposure to the sun. This can lead to vitamin D deficiency.  Certain medical conditions:  Certain inflammatory bowel diseases, such as Crohn disease and ulcerative  colitis.  Diabetes.  Hyperthyroidism.  Hyperparathyroidism. RISK FACTORS Anyone can develop osteoporosis. However, the following factors can increase your risk of developing osteoporosis:  Gender--Women are at higher risk than men.  Age--Being older than 50 years increases your risk.  Ethnicity--White and Asian people have an increased risk.  Weight --Being extremely underweight can increase your risk of osteoporosis.  Family history of osteoporosis--Having a family member who has developed osteoporosis can increase your risk. SYMPTOMS  Usually, people with osteoporosis have no symptoms.  DIAGNOSIS  Signs during a physical exam that may prompt your caregiver to suspect osteoporosis include:  Decreased height. This  is usually caused by the compression of the bones that form your spine (vertebrae) because they have weakened and become fractured.  A curving or rounding of the upper back (kyphosis). To confirm signs of osteoporosis, your caregiver may request a procedure that uses 2 low-dose X-ray beams with different levels of energy to measure your bone mineral density (dual-energy X-ray absorptiometry [DXA]). Also, your caregiver may check your level of vitamin D. TREATMENT  The goal of osteoporosis treatment is to strengthen bones in order to decrease the risk of bone fractures. There are different types of medicines available to help achieve this goal. Some of these medicines work by slowing the processes of bone loss. Some medicines work by increasing bone density. Treatment also involves making sure that your levels of calcium and vitamin D are adequate. PREVENTION  There are things you can do to help prevent osteoporosis. Adequate intake of calcium and vitamin D can help you achieve optimal bone mineral density. Regular exercise can also help, especially resistance and weight-bearing activities. If you smoke, quitting smoking is an important part of osteoporosis prevention. MAKE  SURE YOU:  Understand these instructions.  Will watch your condition.  Will get help right away if you are not doing well or get worse. FOR MORE INFORMATION www.osteo.org and EquipmentWeekly.com.ee Document Released: 12/26/2004 Document Revised: 07/13/2012 Document Reviewed: 03/02/2011 [facility] Patient Information 2015 [place], [place]. This information is not intended to replace advice given to you by your health care provider. Make sure you discuss any questions you have with your health care provider.        Standley Brooking. Panosh M.D.  Total visit 21mns > 50% spent counseling and coordinating care

## 2014-03-23 ENCOUNTER — Encounter: Payer: Self-pay | Admitting: Family

## 2014-03-23 ENCOUNTER — Ambulatory Visit (INDEPENDENT_AMBULATORY_CARE_PROVIDER_SITE_OTHER): Payer: Medicare Other | Admitting: Family

## 2014-03-23 VITALS — BP 140/78 | HR 94 | Temp 97.3°F | Ht 60.0 in | Wt 168.6 lb

## 2014-03-23 DIAGNOSIS — J069 Acute upper respiratory infection, unspecified: Secondary | ICD-10-CM

## 2014-03-23 DIAGNOSIS — R059 Cough, unspecified: Secondary | ICD-10-CM

## 2014-03-23 DIAGNOSIS — R05 Cough: Secondary | ICD-10-CM

## 2014-03-23 MED ORDER — AZITHROMYCIN 250 MG PO TABS: 250.0000 mg | ORAL_TABLET | ORAL | Status: DC

## 2014-03-23 NOTE — Progress Notes (Signed)
Pre visit review using our clinic review tool, if applicable. No additional management support is needed unless otherwise documented below in the visit note. 

## 2014-03-23 NOTE — Progress Notes (Signed)
Subjective:    Patient ID: Ashley Lawson, female    DOB: 10/19/1932, 80 y.o.   MRN: [id number]  HPI 80 year old white female, nonsmoker is in today with complaints of cough, congestion, sinus pressure and ear pain 5 days and worsening. Has been taking Claritin and Flonase that it helped her symptoms some but have not resolved. Daughter is ill with her upper respiratory infection at home currently on antibiotics. Denies any fever or chills. Decreased energy levels.   Review of Systems  Constitutional: Positive for fatigue. Negative for fever and chills.  HENT: Positive for congestion, postnasal drip, sinus pressure, sneezing and sore throat.   Respiratory: Positive for cough.   Cardiovascular: Negative.   Gastrointestinal: Negative.   Endocrine: Negative.   Genitourinary: Negative.   Musculoskeletal: Negative.   Skin: Negative.   Allergic/Immunologic: Negative.   Hematological: Negative.   Psychiatric/Behavioral: Negative.    Past Medical History  Diagnosis Date  . Allergic rhinitis   . Hyperlipidemia   . Osteoporosis     dexa 2011 -2.7 hip nl spine  . IBS (irritable bowel syndrome)     constipation predominant  . Diverticulosis of colon   . Abdominal pain 12/2009    hospitalized    History   Social History  . Marital Status: Widowed    Spouse Name: N/A    Number of Children: N/A  . Years of Education: N/A   Occupational History  . Not on file.   Social History Main Topics  . Smoking status: Never Smoker   . Smokeless tobacco: Never Used  . Alcohol Use: Yes  . Drug Use: Not on file  . Sexual Activity: Not on file   Other Topics Concern  . Not on file   Social History Narrative   Retired   Regular exercise- yes   Widowed   At home with children and GKs    HH of 2     Puppy   Plays cards is social and active     Past Surgical History  Procedure Laterality Date  . Abdominal hysterectomy    . Cataract extraction  2010    both    Family History    Problem Relation Age of Onset  . Other Mother     fx hip  . Other Sister     fx hip  . Bladder Cancer Brother     Allergies  Allergen Reactions  . Alendronate Sodium     REACTION: upset stomach  . Amoxicillin Rash    REACTION: unspecified    Current Outpatient Prescriptions on File Prior to Visit  Medication Sig Dispense Refill  . ALPRAZolam (XANAX) 0.25 MG tablet TAKE 1 TABLET AT BEDTIME AS NEEDED 30 tablet 0  . Ascorbic Acid (VITAMIN C) 100 MG tablet Take 100 mg by mouth daily.      . calcium-vitamin D (OSCAL WITH D) 500-200 MG-UNIT per tablet Take 1 tablet by mouth daily.      . cetirizine (ZYRTEC) 10 MG tablet Take 10 mg by mouth daily.    . fish oil-omega-3 fatty acids 1000 MG capsule Take 2 g by mouth daily.      . fluticasone (FLONASE) 50 MCG/ACT nasal spray 2 spray each nostril qd 16 g 3  . loratadine (CLARITIN) 10 MG tablet Take 10 mg by mouth daily.    . MULTIPLE VITAMIN PO Take by mouth.      . ranitidine (ZANTAC) 150 MG capsule Take 1 capsule (150 mg total) by mouth  2 (two) times daily. For reflux heartburn 60 capsule 3  . traMADol (ULTRAM) 50 MG tablet Take 50 mg by mouth every 6 (six) hours as needed for pain.     No current facility-administered medications on file prior to visit.    BP 140/78 mmHg  Pulse 94  Temp(Src) 97.3 F (36.3 C) (Oral)  Ht 5' (1.524 m)  Wt 168 lb 9.6 oz (76.476 kg)  BMI 32.93 kg/m2chart    Objective:   Physical Exam  Constitutional: She is oriented to person, place, and time. She appears well-developed and well-nourished.  HENT:  Right Ear: External ear normal.  Left Ear: External ear normal.  Nose: Nose normal.  Mouth/Throat: Oropharynx is clear and moist.  Neck: Normal range of motion. Neck supple. No thyromegaly present.  Cardiovascular: Normal rate, regular rhythm and normal heart sounds.   Pulmonary/Chest: Effort normal and breath sounds normal.  Musculoskeletal: Normal range of motion.  Neurological: She is alert and  oriented to person, place, and time.  Skin: Skin is warm and dry.  Psychiatric: She has a normal mood and affect.          Assessment & Plan:  Ashley Lawson was seen today for uri.  Diagnoses and associated orders for this visit:  Acute upper respiratory infection  Cough  Other Orders - azithromycin (ZITHROMAX Z-PAK) 250 MG tablet; Take 1 tablet (250 mg total) by mouth as directed. As directed    Call the office with any questions or concerns. Plain water. Continue Claritin and Flonase. Recheck as scheduled and as needed.

## 2014-03-23 NOTE — Patient Instructions (Signed)
Upper Respiratory Infection, Adult An upper respiratory infection (URI) is also sometimes known as the common cold. The upper respiratory tract includes the nose, sinuses, throat, trachea, and bronchi. Bronchi are the airways leading to the lungs. Most people improve within 1 week, but symptoms can last up to 2 weeks. A residual cough may last even longer.  CAUSES Many different viruses can infect the tissues lining the upper respiratory tract. The tissues become irritated and inflamed and often become very moist. Mucus production is also common. A cold is contagious. You can easily spread the virus to others by oral contact. This includes kissing, sharing a glass, coughing, or sneezing. Touching your mouth or nose and then touching a surface, which is then touched by another person, can also spread the virus. SYMPTOMS  Symptoms typically develop 1 to 3 days after you come in contact with a cold virus. Symptoms vary from person to person. They may include:  Runny nose.  Sneezing.  Nasal congestion.  Sinus irritation.  Sore throat.  Loss of voice (laryngitis).  Cough.  Fatigue.  Muscle aches.  Loss of appetite.  Headache.  Low-grade fever. DIAGNOSIS  You might diagnose your own cold based on familiar symptoms, since most people get a cold 2 to 3 times a year. Your caregiver can confirm this based on your exam. Most importantly, your caregiver can check that your symptoms are not due to another disease such as strep throat, sinusitis, pneumonia, asthma, or epiglottitis. Blood tests, throat tests, and X-rays are not necessary to diagnose a common cold, but they may sometimes be helpful in excluding other more serious diseases. Your caregiver will decide if any further tests are required. RISKS AND COMPLICATIONS  You may be at risk for a more severe case of the common cold if you smoke cigarettes, have chronic heart disease (such as heart failure) or lung disease (such as asthma), or if  you have a weakened immune system. The very young and very old are also at risk for more serious infections. Bacterial sinusitis, middle ear infections, and bacterial pneumonia can complicate the common cold. The common cold can worsen asthma and chronic obstructive pulmonary disease (COPD). Sometimes, these complications can require emergency medical care and may be life-threatening. PREVENTION  The best way to protect against getting a cold is to practice good hygiene. Avoid oral or hand contact with people with cold symptoms. Wash your hands often if contact occurs. There is no clear evidence that vitamin C, vitamin E, echinacea, or exercise reduces the chance of developing a cold. However, it is always recommended to get plenty of rest and practice good nutrition. TREATMENT  Treatment is directed at relieving symptoms. There is no cure. Antibiotics are not effective, because the infection is caused by a virus, not by bacteria. Treatment may include:  Increased fluid intake. Sports drinks offer valuable electrolytes, sugars, and fluids.  Breathing heated mist or steam (vaporizer or shower).  Eating chicken soup or other clear broths, and maintaining good nutrition.  Getting plenty of rest.  Using gargles or lozenges for comfort.  Controlling fevers with ibuprofen or acetaminophen as directed by your caregiver.  Increasing usage of your inhaler if you have asthma. Zinc gel and zinc lozenges, taken in the first 24 hours of the common cold, can shorten the duration and lessen the severity of symptoms. Pain medicines may help with fever, muscle aches, and throat pain. A variety of non-prescription medicines are available to treat congestion and runny nose. Your caregiver   can make recommendations and may suggest nasal or lung inhalers for other symptoms.  HOME CARE INSTRUCTIONS   Only take over-the-counter or prescription medicines for pain, discomfort, or fever as directed by your  caregiver.  Use a warm mist humidifier or inhale steam from a shower to increase air moisture. This may keep secretions moist and make it easier to breathe.  Drink enough water and fluids to keep your urine clear or pale yellow.  Rest as needed.  Return to work when your temperature has returned to normal or as your caregiver advises. You may need to stay home longer to avoid infecting others. You can also use a face mask and careful hand washing to prevent spread of the virus. SEEK MEDICAL CARE IF:   After the first few days, you feel you are getting worse rather than better.  You need your caregiver's advice about medicines to control symptoms.  You develop chills, worsening shortness of breath, or brown or red sputum. These may be signs of pneumonia.  You develop yellow or brown nasal discharge or pain in the face, especially when you bend forward. These may be signs of sinusitis.  You develop a fever, swollen neck glands, pain with swallowing, or white areas in the back of your throat. These may be signs of strep throat. SEEK IMMEDIATE MEDICAL CARE IF:   You have a fever.  You develop severe or persistent headache, ear pain, sinus pain, or chest pain.  You develop wheezing, a prolonged cough, cough up blood, or have a change in your usual mucus (if you have chronic lung disease).  You develop sore muscles or a stiff neck. Document Released: 09/11/2000 Document Revised: 06/10/2011 Document Reviewed: 06/23/2013 [employer] Patient Information 2015 [employer]. This information is not intended to replace advice given to you by your health care provider. Make sure you discuss any questions you have with your health care provider.  

## 2014-04-05 ENCOUNTER — Other Ambulatory Visit: Payer: Self-pay | Admitting: Internal Medicine

## 2014-04-07 NOTE — Telephone Encounter (Signed)
Ok x 1

## 2014-04-15 NOTE — Telephone Encounter (Signed)
I  have already oked 1 refill.

## 2014-04-18 NOTE — Telephone Encounter (Signed)
Called to the pharmacy and left on voicemail. 

## 2014-05-17 DIAGNOSIS — Z961 Presence of intraocular lens: Secondary | ICD-10-CM | POA: Diagnosis not present

## 2014-05-17 DIAGNOSIS — H3531 Nonexudative age-related macular degeneration: Secondary | ICD-10-CM | POA: Diagnosis not present

## 2014-05-17 DIAGNOSIS — D2312 Other benign neoplasm of skin of left eyelid, including canthus: Secondary | ICD-10-CM | POA: Diagnosis not present

## 2014-05-17 DIAGNOSIS — H524 Presbyopia: Secondary | ICD-10-CM | POA: Diagnosis not present

## 2014-05-26 DIAGNOSIS — L918 Other hypertrophic disorders of the skin: Secondary | ICD-10-CM | POA: Diagnosis not present

## 2014-05-26 DIAGNOSIS — L570 Actinic keratosis: Secondary | ICD-10-CM | POA: Diagnosis not present

## 2014-05-26 DIAGNOSIS — Z85828 Personal history of other malignant neoplasm of skin: Secondary | ICD-10-CM | POA: Diagnosis not present

## 2014-05-26 DIAGNOSIS — L812 Freckles: Secondary | ICD-10-CM | POA: Diagnosis not present

## 2014-05-26 DIAGNOSIS — L821 Other seborrheic keratosis: Secondary | ICD-10-CM | POA: Diagnosis not present

## 2014-06-16 DIAGNOSIS — N281 Cyst of kidney, acquired: Secondary | ICD-10-CM | POA: Diagnosis not present

## 2014-06-16 DIAGNOSIS — K760 Fatty (change of) liver, not elsewhere classified: Secondary | ICD-10-CM | POA: Diagnosis not present

## 2014-06-16 DIAGNOSIS — N2889 Other specified disorders of kidney and ureter: Secondary | ICD-10-CM | POA: Diagnosis not present

## 2014-06-20 DIAGNOSIS — R350 Frequency of micturition: Secondary | ICD-10-CM | POA: Diagnosis not present

## 2014-06-20 DIAGNOSIS — N281 Cyst of kidney, acquired: Secondary | ICD-10-CM | POA: Diagnosis not present

## 2014-06-21 ENCOUNTER — Encounter: Payer: Self-pay | Admitting: Internal Medicine

## 2014-07-18 ENCOUNTER — Other Ambulatory Visit: Payer: Self-pay | Admitting: Internal Medicine

## 2014-07-18 DIAGNOSIS — Z1231 Encounter for screening mammogram for malignant neoplasm of breast: Secondary | ICD-10-CM

## 2014-07-22 ENCOUNTER — Encounter: Payer: Self-pay | Admitting: Internal Medicine

## 2014-08-05 ENCOUNTER — Ambulatory Visit (HOSPITAL_COMMUNITY): Payer: Medicare Other

## 2014-08-11 ENCOUNTER — Ambulatory Visit (HOSPITAL_COMMUNITY)
Admission: RE | Admit: 2014-08-11 | Discharge: 2014-08-11 | Disposition: A | Discharge status: Home / Self Care | Payer: Medicare Other | Source: Ambulatory Visit | Attending: Internal Medicine | Admitting: Internal Medicine

## 2014-08-11 DIAGNOSIS — Z1231 Encounter for screening mammogram for malignant neoplasm of breast: Secondary | ICD-10-CM

## 2014-08-22 ENCOUNTER — Encounter: Payer: Self-pay | Admitting: *Deleted

## 2014-08-23 ENCOUNTER — Ambulatory Visit (INDEPENDENT_AMBULATORY_CARE_PROVIDER_SITE_OTHER): Payer: Medicare Other | Admitting: Internal Medicine

## 2014-08-23 ENCOUNTER — Encounter: Payer: Self-pay | Admitting: Internal Medicine

## 2014-08-23 VITALS — BP 130/76 | HR 64 | Ht 60.0 in | Wt 167.4 lb

## 2014-08-23 DIAGNOSIS — K625 Hemorrhage of anus and rectum: Secondary | ICD-10-CM

## 2014-08-23 DIAGNOSIS — R140 Abdominal distension (gaseous): Secondary | ICD-10-CM

## 2014-08-23 MED ORDER — NA SULFATE-K SULFATE-MG SULF 17.5-3.13-1.6 GM/177ML PO SOLN: 1.0000 | Freq: Once | ORAL | Status: DC

## 2014-08-23 NOTE — Patient Instructions (Addendum)
Dr Regis Bill  You have been scheduled for a colonoscopy. Please follow written instructions given to you at your visit today.  Please pick up your prep supplies at the pharmacy within the next 1-3 days. If you use inhalers (even only as needed), please bring them with you on the day of your procedure. Your physician has requested that you go to www.startemmi.com and enter the access code given to you at your visit today. This web site gives a general overview about your procedure. However, you should still follow specific instructions given to you by our office regarding your preparation for the procedure.

## 2014-08-23 NOTE — Progress Notes (Signed)
Ashley Lawson 1933/02/13 [id number]  Note: This dictation was prepared with Dragon digital system. Any transcriptional errors that result from this procedure are unintentional.   History of Present Illness: This is a 81 year old white female who is here today to discuss recall colonoscopy. She has had 2 episodes of rectal bleeding. She complains  of bloating and gas. She takes prune juice daily. Colonoscopy in 2000 showed hyperplastic polyp. Subsequent colonoscopy in 2005 revealed diverticulosis. Most recent colonoscopy in May 2011 did not show any polyps. She has a family history of colon cancer in her brother and history of  irritable bowel syndrome. CT scan of the abdomen in January 2015 confirmed fatty liver and pelvic floor laxity. She also has a lesion in the lower left renal pole which has been stable and followed by urologist.    Past Medical History  Diagnosis Date  . Allergic rhinitis   . Hyperlipidemia   . Osteoporosis     dexa 2011 -2.7 hip nl spine  . IBS (irritable bowel syndrome)     constipation predominant  . Diverticulosis of colon   . Abdominal pain 12/2009    hospitalized  . Internal hemorrhoids   . Hepatic steatosis   . Hyperplastic colon polyp   . Polymyalgia rheumatica     Past Surgical History  Procedure Laterality Date  . Abdominal hysterectomy    . Cataract extraction  2010    both    Allergies  Allergen Reactions  . Alendronate Sodium     REACTION: upset stomach  . Amoxicillin Rash    REACTION: unspecified    Family history and social history have been reviewed.  Review of Systems: Gas, bloating.  The remainder of the 10 point ROS is negative except as outlined in the H&P  Physical Exam: General Appearance Well developed, in no distress, overweight Eyes  Non icteric  HEENT  Non traumatic, normocephalic  Mouth No lesion, tongue papillated, no cheilosis Neck Supple without adenopathy, thyroid not enlarged, no carotid bruits, no JVD Lungs  Clear to auscultation bilaterally COR Normal S1, normal S2, regular rhythm, no murmur, quiet precordium Abdomen obese soft tender in left lower quadrant. No tympany or distention Rectal external hemorrhoids. Small amount of Hemoccult-negative stool Extremities  No pedal edema Skin No lesions Neurological Alert and oriented x 3 Psychological Normal mood and affect  Assessment and Plan:   81 year old white female with irritable bowel syndrome with predominant  bloating. She has mild constipation. I have advised the probiotic and fiber supplements. She is due for recall colonoscopy because of family history of colon cancer in her brother. This will be scheduled for June 2016    [name] 08/23/2014

## 2014-09-21 ENCOUNTER — Ambulatory Visit (AMBULATORY_SURGERY_CENTER): Payer: Medicare Other | Admitting: Internal Medicine

## 2014-09-21 ENCOUNTER — Encounter: Payer: Self-pay | Admitting: Internal Medicine

## 2014-09-21 VITALS — BP 147/77 | HR 82 | Temp 97.6°F | Resp 28 | Ht 60.0 in | Wt 167.0 lb

## 2014-09-21 DIAGNOSIS — D125 Benign neoplasm of sigmoid colon: Secondary | ICD-10-CM

## 2014-09-21 DIAGNOSIS — K635 Polyp of colon: Secondary | ICD-10-CM

## 2014-09-21 DIAGNOSIS — Z800 Family history of malignant neoplasm of digestive organs: Secondary | ICD-10-CM | POA: Diagnosis present

## 2014-09-21 DIAGNOSIS — K625 Hemorrhage of anus and rectum: Secondary | ICD-10-CM | POA: Diagnosis not present

## 2014-09-21 DIAGNOSIS — D122 Benign neoplasm of ascending colon: Secondary | ICD-10-CM

## 2014-09-21 MED ORDER — SODIUM CHLORIDE 0.9 % IV SOLN: 500.0000 mL | INTRAVENOUS | Status: DC

## 2014-09-21 NOTE — Patient Instructions (Signed)
YOU HAD AN ENDOSCOPIC PROCEDURE TODAY AT [facility]:   Refer to the procedure report that was given to you for any specific questions about what was found during the examination.  If the procedure report does not answer your questions, please call your gastroenterologist to clarify.  If you requested that your care partner not be given the details of your procedure findings, then the procedure report has been included in a sealed envelope for you to review at your convenience later.  YOU SHOULD EXPECT: Some feelings of bloating in the abdomen. Passage of more gas than usual.  Walking can help get rid of the air that was put into your GI tract during the procedure and reduce the bloating. If you had a lower endoscopy (such as a colonoscopy or flexible sigmoidoscopy) you may notice spotting of blood in your stool or on the toilet paper. If you underwent a bowel prep for your procedure, you may not have a normal bowel movement for a few days.  Please Note:  You might notice some irritation and congestion in your nose or some drainage.  This is from the oxygen used during your procedure.  There is no need for concern and it should clear up in a day or so.  SYMPTOMS TO REPORT IMMEDIATELY:   Following lower endoscopy (colonoscopy or flexible sigmoidoscopy):  Excessive amounts of blood in the stool  Significant tenderness or worsening of abdominal pains  Swelling of the abdomen that is new, acute  Fever of 100F or higher  For urgent or emergent issues, a gastroenterologist can be reached at any hour by calling [phone].   DIET: Your first meal following the procedure should be a small meal and then it is ok to progress to your normal diet. Heavy or fried foods are harder to digest and may make you feel nauseous or bloated.  Likewise, meals heavy in dairy and vegetables can increase bloating.  Drink plenty of fluids but you should avoid alcoholic beverages for 24  hours.  ACTIVITY:  You should plan to take it easy for the rest of today and you should NOT DRIVE or use heavy machinery until tomorrow (because of the sedation medicines used during the test).    FOLLOW UP: Our staff will call the number listed on your records the next business day following your procedure to check on you and address any questions or concerns that you may have regarding the information given to you following your procedure. If we do not reach you, we will leave a message.  However, if you are feeling well and you are not experiencing any problems, there is no need to return our call.  We will assume that you have returned to your regular daily activities without incident.  If any biopsies were taken you will be contacted by phone or by letter within the next 1-3 weeks.  Please call us at [phone] if you have not heard about the biopsies in 3 weeks.    SIGNATURES/CONFIDENTIALITY: You and/or your care partner have signed paperwork which will be entered into your electronic medical record.  These signatures attest to the fact that that the information above on your After Visit Summary has been reviewed and is understood.  Full responsibility of the confidentiality of this discharge information lies with you and/or your care-partner.  Polyp, diverticulosis, and high fiber diet information given. No aspirin or anti inflammatory med for 2 weeks.

## 2014-09-21 NOTE — Progress Notes (Signed)
Called to room to assist during endoscopic procedure.  Patient ID and intended procedure confirmed with present staff. Received instructions for my participation in the procedure from the performing physician.  

## 2014-09-21 NOTE — Progress Notes (Signed)
Report to PACU, RN, vss, BBS= Clear.  

## 2014-09-21 NOTE — Op Note (Signed)
[facility]  [address] [place], [address]   COLONOSCOPY PROCEDURE REPORT  PATIENT: Ashley, Lawson  MR#: [id number] BIRTHDATE: 06-Nov-1932 , 82  yrs. old GENDER: female ENDOSCOPIST: Lafayette Dragon, MD REFERRED OA:CZYSA Darnelle Going, M.D. PROCEDURE DATE:  09/21/2014 PROCEDURE:   Colonoscopy, screening, Colonoscopy with cold biopsy polypectomy, and Colonoscopy with snare polypectomy First Screening Colonoscopy - Avg.  risk and is 50 yrs.  old or older - No.  Prior Negative Screening - Now for repeat screening. N/A  History of Adenoma - Now for follow-up colonoscopy & has been > or = to 3 yrs.  N/A  Polyps removed today? Yes ASA CLASS:   Class III INDICATIONS:Screening for colonic neoplasia, FH Colon or Rectal Adenocarcinoma, and positive family history of colon cancer in patient's brother.  Prior colonoscopy in 2000 showed hyperplastic polyp last colonoscopy May 2011 showed diverticulosis and hemorrhoids. MEDICATIONS: Monitored anesthesia care and Propofol 160 mg IV  DESCRIPTION OF PROCEDURE:   After the risks benefits and alternatives of the procedure were thoroughly explained, informed consent was obtained.  The digital rectal exam revealed no abnormalities of the rectum.   The LB [id number]-[id number]  endoscope was introduced through the anus and advanced to the cecum, which was identified by both the appendix and ileocecal valve. No adverse events experienced.   The quality of the prep was good.  (MoviPrep was used)  The instrument was then slowly withdrawn as the colon was fully examined. Estimated blood loss is zero unless otherwise noted in this procedure report.      COLON FINDINGS: Three sessile polyps measuring 6 mm in size with mucous caps were found in the sigmoid colon and ascending colon.  A polypectomy was performed with a cold snare.  The resection was complete, the polyp tissue was completely retrieved and sent to histology.  A polypectomy was  performed with cold forceps.  The resection was complete, the polyp tissue was completely retrieved and sent to histology.   There was moderate diverticulosis noted in the descending colon and sigmoid colon with associated muscular hypertrophy, angulation and tortuosity.  Retroflexed views revealed no abnormalities. The time to cecum = 8.14 Withdrawal time = 10.46 The scope was withdrawn and the procedure completed.  COMPLICATIONS: There were no immediate complications.  ENDOSCOPIC IMPRESSION: 1.   Three sessile polyps were found in the sigmoid colon x2  and ascending colonx1; polypectomy was performed with a cold snare in the sigmoid colon; polypectomy was performed with cold forceps in the ascending colon 2.   There was moderate diverticulosis noted in the descending colon and sigmoid colon  RECOMMENDATIONS: 1.  Await pathology results 2.  No aspirin or anti-inflammatory agents for 2 weeks 3. High fiber diet 4. No recall due to age  eSigned:  Lafayette Dragon, MD 09/21/2014 4:41 PM   cc:   PATIENT NAME:  Ashley, Lawson MR#: [id number]

## 2014-09-22 ENCOUNTER — Telehealth: Payer: Self-pay

## 2014-09-22 NOTE — Telephone Encounter (Signed)
  Follow up Call-  Call back number 09/21/2014  Post procedure Call Back phone  # [phone]  Permission to leave phone message Yes     Patient questions:  Do you have a fever, pain , or abdominal swelling? No. Pain Score  0 *  Have you tolerated food without any problems? Yes.    Have you been able to return to your normal activities? Yes.    Do you have any questions about your discharge instructions: Diet   No. Medications  No. Follow up visit  No.  Do you have questions or concerns about your Care? No.  Actions: * If pain score is 4 or above: No action needed, pain <4.

## 2014-09-26 ENCOUNTER — Encounter: Payer: Self-pay | Admitting: Internal Medicine

## 2014-09-27 ENCOUNTER — Other Ambulatory Visit: Payer: Self-pay | Admitting: Internal Medicine

## 2014-09-29 NOTE — Telephone Encounter (Signed)
Called to the pharmacy and left on machine. 

## 2014-09-29 NOTE — Telephone Encounter (Signed)
Ok x 1

## 2014-10-26 DIAGNOSIS — S93602A Unspecified sprain of left foot, initial encounter: Secondary | ICD-10-CM | POA: Diagnosis not present

## 2014-11-09 DIAGNOSIS — S93602D Unspecified sprain of left foot, subsequent encounter: Secondary | ICD-10-CM | POA: Diagnosis not present

## 2014-11-24 DIAGNOSIS — L570 Actinic keratosis: Secondary | ICD-10-CM | POA: Diagnosis not present

## 2014-11-24 DIAGNOSIS — L814 Other melanin hyperpigmentation: Secondary | ICD-10-CM | POA: Diagnosis not present

## 2014-11-24 DIAGNOSIS — D485 Neoplasm of uncertain behavior of skin: Secondary | ICD-10-CM | POA: Diagnosis not present

## 2014-11-24 DIAGNOSIS — L821 Other seborrheic keratosis: Secondary | ICD-10-CM | POA: Diagnosis not present

## 2014-11-24 DIAGNOSIS — Z85828 Personal history of other malignant neoplasm of skin: Secondary | ICD-10-CM | POA: Diagnosis not present

## 2014-11-24 DIAGNOSIS — L550 Sunburn of first degree: Secondary | ICD-10-CM | POA: Diagnosis not present

## 2015-01-10 ENCOUNTER — Other Ambulatory Visit: Payer: Medicare Other

## 2015-01-11 ENCOUNTER — Other Ambulatory Visit (INDEPENDENT_AMBULATORY_CARE_PROVIDER_SITE_OTHER): Payer: Medicare Other

## 2015-01-11 DIAGNOSIS — M549 Dorsalgia, unspecified: Secondary | ICD-10-CM

## 2015-01-11 DIAGNOSIS — E785 Hyperlipidemia, unspecified: Secondary | ICD-10-CM | POA: Diagnosis not present

## 2015-01-11 DIAGNOSIS — Z0000 Encounter for general adult medical examination without abnormal findings: Secondary | ICD-10-CM

## 2015-01-11 LAB — POCT URINALYSIS DIPSTICK
BILIRUBIN UA: NEGATIVE
Glucose, UA: NEGATIVE
KETONES UA: NEGATIVE
NITRITE UA: NEGATIVE
PH UA: 6.5
Protein, UA: NEGATIVE
Spec Grav, UA: 1.02
Urobilinogen, UA: 0.2

## 2015-01-11 LAB — BASIC METABOLIC PANEL
BUN: 10 mg/dL (ref 6–23)
CHLORIDE: 105 meq/L (ref 96–112)
CO2: 27 meq/L (ref 19–32)
CREATININE: 0.85 mg/dL (ref 0.40–1.20)
Calcium: 9.7 mg/dL (ref 8.4–10.5)
GFR: 68.3 mL/min (ref >60.00)
GLUCOSE: 90 mg/dL (ref 70–99)
Potassium: 4.6 mEq/L (ref 3.5–5.1)
Sodium: 140 mEq/L (ref 135–145)

## 2015-01-11 LAB — TSH: TSH: 4.93 u[IU]/mL — AB (ref 0.35–4.50)

## 2015-01-11 LAB — CBC WITH DIFFERENTIAL/PLATELET
BASOS PCT: 0.8 % (ref 0.0–3.0)
Basophils Absolute: 0 10*3/uL (ref 0.0–0.1)
EOS ABS: 0.1 10*3/uL (ref 0.0–0.7)
Eosinophils Relative: 2.5 % (ref 0.0–5.0)
HCT: 40 % (ref 36.0–46.0)
Hemoglobin: 13.3 g/dL (ref 12.0–15.0)
LYMPHS ABS: 1.9 10*3/uL (ref 0.7–4.0)
Lymphocytes Relative: 34.9 % (ref 12.0–46.0)
MCHC: 33.4 g/dL (ref 30.0–36.0)
MCV: 94.4 fl (ref 78.0–100.0)
MONO ABS: 0.4 10*3/uL (ref 0.1–1.0)
Monocytes Relative: 7.2 % (ref 3.0–12.0)
NEUTROS ABS: 3 10*3/uL (ref 1.4–7.7)
Neutrophils Relative %: 54.6 % (ref 43.0–77.0)
Platelets: 256 10*3/uL (ref 150.0–400.0)
RBC: 4.23 Mil/uL (ref 3.87–5.11)
RDW: 13.4 % (ref 11.5–15.5)
WBC: 5.5 10*3/uL (ref 4.0–10.5)

## 2015-01-11 LAB — HEPATIC FUNCTION PANEL
ALT: 22 U/L (ref 0–35)
AST: 24 U/L (ref 0–37)
Albumin: 4 g/dL (ref 3.5–5.2)
Alkaline Phosphatase: 65 U/L (ref 39–117)
BILIRUBIN DIRECT: 0.1 mg/dL (ref 0.0–0.3)
BILIRUBIN TOTAL: 0.7 mg/dL (ref 0.2–1.2)
Total Protein: 7.2 g/dL (ref 6.0–8.3)

## 2015-01-11 LAB — LIPID PANEL
Cholesterol: 201 mg/dL — ABNORMAL HIGH (ref 0–200)
HDL: 55.4 mg/dL (ref >39.00)
LDL Cholesterol: 119 mg/dL — ABNORMAL HIGH (ref 0–99)
NONHDL: 145.99
Total CHOL/HDL Ratio: 4
Triglycerides: 136 mg/dL (ref 0.0–149.0)
VLDL: 27.2 mg/dL (ref 0.0–40.0)

## 2015-01-13 LAB — URINE CULTURE
COLONY COUNT: NO GROWTH
Organism ID, Bacteria: NO GROWTH

## 2015-01-17 ENCOUNTER — Ambulatory Visit (INDEPENDENT_AMBULATORY_CARE_PROVIDER_SITE_OTHER): Payer: Medicare Other | Admitting: Internal Medicine

## 2015-01-17 ENCOUNTER — Encounter: Payer: Self-pay | Admitting: Internal Medicine

## 2015-01-17 VITALS — BP 148/84 | Temp 97.8°F | Ht 59.25 in | Wt 167.0 lb

## 2015-01-17 DIAGNOSIS — R8290 Unspecified abnormal findings in urine: Secondary | ICD-10-CM | POA: Diagnosis not present

## 2015-01-17 DIAGNOSIS — Z23 Encounter for immunization: Secondary | ICD-10-CM | POA: Diagnosis not present

## 2015-01-17 DIAGNOSIS — R946 Abnormal results of thyroid function studies: Secondary | ICD-10-CM | POA: Diagnosis not present

## 2015-01-17 DIAGNOSIS — K589 Irritable bowel syndrome without diarrhea: Secondary | ICD-10-CM | POA: Diagnosis not present

## 2015-01-17 DIAGNOSIS — R399 Unspecified symptoms and signs involving the genitourinary system: Secondary | ICD-10-CM

## 2015-01-17 DIAGNOSIS — R7989 Other specified abnormal findings of blood chemistry: Secondary | ICD-10-CM

## 2015-01-17 DIAGNOSIS — Z0000 Encounter for general adult medical examination without abnormal findings: Secondary | ICD-10-CM

## 2015-01-17 DIAGNOSIS — Q6100 Congenital renal cyst, unspecified: Secondary | ICD-10-CM

## 2015-01-17 DIAGNOSIS — N281 Cyst of kidney, acquired: Secondary | ICD-10-CM

## 2015-01-17 MED ORDER — ALPRAZOLAM 0.25 MG PO TABS: 0.1250 mg | ORAL_TABLET | Freq: Every evening | ORAL | Status: DC | PRN

## 2015-01-17 NOTE — Progress Notes (Signed)
Pre visit review using our clinic review tool, if applicable. No additional management support is needed unless otherwise documented below in the visit note.  Chief Complaint  Patient presents with  . Medicare Wellness    recurren abd sx  urinary     HPI: Ashley Lawson 81 y.o. comes in today for Preventive Medicare wellness visit . And other issues   Has had : some left side ain recently without fever hematuria  Constipation using prune juice  But ask a bout other  . Gets gassy   Has dx of ibs and diverticula in past . No fever . Active with gr children  Has seen dr Lynann Bologna for sprained foot   rx boot immobilization  Getting better  hasnt seen urology recently Rare use if xanax  Takes 1/4   No hx o thyroid problem    Health Maintenance  Topic Date Due  . INFLUENZA VACCINE  10/31/2015  . MAMMOGRAM  08/10/2016  . TETANUS/TDAP  08/29/2021  . DEXA SCAN  Completed  . ZOSTAVAX  Completed  . PNA vac Low Risk Adult  Completed   Health Maintenance Review LIFESTYLE:  TADn Sugar beverages:n Sleep:ok     MEDICARE DOCUMENT QUESTIONS  TO SCAN   Hearing: see screen ok   Vision:  No limitations at present . Last eye check UTD mccuen  Safety:  Has smoke detector and wears seat belts.  No firearms. No excess sun exposure. Sees dentist regularly.  Falls: n  Advance directive :  Reviewed  Doesn't have one hasnt gotten to  It   Memory: Felt to be good  , no concern from her or her family.  Depression: No anhedonia unusual crying or depressive symptoms  Nutrition: Eats well balanced diet; adequate calcium and vitamin D. No swallowing chewing problems.  Injury: no major injuries in the last six monthsexcept above .  Other healthcare providers:  Reviewed today .  Social:   Widowed takes care of  ggk.   Preventive parameters: up-to-date  Reviewed   ADLS:   There are no problems or need for assistance  driving, feeding, obtaining food, dressing, toileting and bathing, managing  money using phone. She is independent.   ROS:  GEN/ HEENT: No fever, significant weight changes sweats headaches vision problems hearing changes, CV/ PULM; No chest pain shortness of breath cough, syncope,edema  change in exercise tolerance. GI /GU: No adominal pain, SEE ABOVE vomiting, change in bowel habits. No blood in the stool. SKIN/HEME: ,no acute skin rashes suspicious lesions or bleeding. No lymphadenopathy, nodules, masses.  NEURO/ PSYCH:  No neurologic signs such as weakness numbness. No depression anxiety.above baseline see above IMM/ Allergy: No unusual infections.  Allergy .   REST of 12 system review negative except as per HPI   Past Medical History  Diagnosis Date  . Allergic rhinitis   . Hyperlipidemia   . Osteoporosis     dexa 2011 -2.7 hip nl spine  . IBS (irritable bowel syndrome)     constipation predominant  . Diverticulosis of colon   . Abdominal pain 12/2009    hospitalized  . Internal hemorrhoids   . Hepatic steatosis   . Hyperplastic colon polyp   . Polymyalgia rheumatica ([place])   . Dog bite(E906.0) 08/30/2011  . PMR (polymyalgia rheumatica) ([facility]) 08/30/2011    under rheum care  on low dose pred 5 mg     Family History  Problem Relation Age of Onset  . Other Mother  fx hip  . Other Sister     fx hip  . Bladder Cancer Brother     Social History   Social History  . Marital Status: Widowed    Spouse Name: N/A  . Number of Children: N/A  . Years of Education: N/A   Social History Main Topics  . Smoking status: Never Smoker   . Smokeless tobacco: Never Used  . Alcohol Use: Yes  . Drug Use: None  . Sexual Activity: Not Asked   Other Topics Concern  . None   Social History Narrative   Retired   Regular exercise- yes   Widowed   At home with children and GKs    HH of 2     Puppy   Plays cards is social and active     Outpatient Encounter Prescriptions as of 01/17/2015  Medication Sig  . ALPRAZolam (XANAX) 0.25 MG tablet Take  0.5-1 tablets (0.125-0.25 mg total) by mouth at bedtime as needed for anxiety.  . Ascorbic Acid (VITAMIN C) 100 MG tablet Take 100 mg by mouth daily.    . calcium-vitamin D (OSCAL WITH D) 500-200 MG-UNIT per tablet Take 1 tablet by mouth daily.    . fish oil-omega-3 fatty acids 1000 MG capsule Take 2 g by mouth daily.    . fluticasone (FLONASE) 50 MCG/ACT nasal spray 2 spray each nostril qd  . lansoprazole (PREVACID) 15 MG capsule Take 15 mg by mouth daily at 12 noon.  . loratadine (CLARITIN) 10 MG tablet Take 10 mg by mouth daily.  . MULTIPLE VITAMIN PO Take by mouth.    . Probiotic Product (ALIGN PO) Take 1 tablet by mouth daily.  . ranitidine (ZANTAC) 150 MG capsule Take 1 capsule (150 mg total) by mouth 2 (two) times daily. For reflux heartburn  . RESTASIS 0.05 % ophthalmic emulsion Place 1 drop into both eyes 2 (two) times daily.  . traMADol (ULTRAM) 50 MG tablet Take 50 mg by mouth every 6 (six) hours as needed for pain.  . [DISCONTINUED] ALPRAZolam (XANAX) 0.25 MG tablet TAKE 1 TABLET BY MOUTH AT BEDTIME AS NEEDED  . cetirizine (ZYRTEC) 10 MG tablet Take 10 mg by mouth daily.   No facility-administered encounter medications on file as of 01/17/2015.    EXAM:  BP 148/84 mmHg  Temp(Src) 97.8 F (36.6 C) (Oral)  Ht 4' 11.25" (1.505 m)  Wt 167 lb (75.751 kg)  BMI 33.44 kg/m2  Body mass index is 33.44 kg/(m^2).  Physical Exam: Vital signs reviewed QVZ:DGLO is a well-developed well-nourished alert cooperative   who appears stated age in no acute distress.  HEENT: normocephalic atraumatic , Eyes: PERRL EOM's full, conjunctiva clear, Nares: paten,t no deformity discharge or tenderness., Ears: no deformity EAC's clear TMs with normal landmarks. Mouth: clear OP, no lesions, edema.  Moist mucous membranes. Dentition in adequate repair. NECK: supple without masses, thyromegaly or bruits. CHEST/PULM:  Clear to auscultation and percussion breath sounds equal no wheeze , rales or rhonchi.  No chest wall deformities or tenderness. CV: PMI is nondisplaced, S1 S2 no gallops, murmurs, rubs. Peripheral pulses are full without delay.No JVD . Breast: normal by inspection . No dimpling, discharge, masses, tenderness or discharge . ABDOMEN: Bowel sounds normal  Mild tender llq no g r mass  No guard or rebound, no hepato splenomegal no CVA tenderness.   Extremtities:  No clubbing cyanosis or edema, no acute joint swelling or redness no focal atrophy some djd changes  NEURO:  Oriented x3,  cranial nerves 3-12 appear to be intact, no obvious focal weakness,gait within normal limits no abnormal reflexes or asymmetrical SKIN: No acute rashes normal turgor, color, no bruising or petechiae. PSYCH: Oriented, good eye contact, no obvious depression anxiety, cognition and judgment appear normal. LN: no cervical axillary inguinal adenopathy No noted deficits in memory, attention, and speech.   Lab Results  Component Value Date   WBC 5.5 01/11/2015   HGB 13.3 01/11/2015   HCT 40.0 01/11/2015   PLT 256.0 01/11/2015   GLUCOSE 90 01/11/2015   CHOL 201* 01/11/2015   TRIG 136.0 01/11/2015   HDL 55.40 01/11/2015   LDLDIRECT 125.8 02/23/2013   LDLCALC 119* 01/11/2015   ALT 22 01/11/2015   AST 24 01/11/2015   NA 140 01/11/2015   K 4.6 01/11/2015   CL 105 01/11/2015   CREATININE 0.85 01/11/2015   BUN 10 01/11/2015   CO2 27 01/11/2015   TSH 4.93* 01/11/2015    ASSESSMENT AND PLAN:  Discussed the following assessment and plan:  Visit for preventive health examination  Medicare annual wellness visit, subsequent - advance directive reviwed booklet given   Elevated TSH - borderline up for age repeat in 1 month no ov needed if ok  - Plan: TSH, T4, free, Thyroid antibodies  Need for prophylactic vaccination and inoculation against influenza - Plan: Flu vaccine HIGH DOSE PF (Fluzone High dose)  Abnormal urine - ucx neg  - Plan: POC Urinalysis Dipstick  Irritable bowel syndrome, unspecified  type  Lower urinary tract symptoms (LUTS) - hx of same ucx ng  Renal cyst incidental  - 5 mm incidental findings ct for gi sx ? need for repat 12 mont from 7 15 Repeat tsh and free t4 atibody studies in 3-4 weeks  advice about the ibs luts sx  Repeat ua consdier see her urologist again .  Review of record shows small 5 mm cyst on a ct scan done bu the gi team .  considieration of repeat in 12 months but said stable and probably benign .  Would opine to uro about repeat scan .  Patient Care Team: Burnis Medin, MD as PCP - General Bjorn Loser, MD (Urology) Particia Nearing, MD (Dermatology) Luberta Mutter, MD (Ophthalmology) Almedia Balls, MD (Orthopedic Surgery) Ouida Sills  (Rheumatology) Jarome Matin, MD as Consulting Physician (Dermatology)  Patient Instructions  Bring in a new urine   Clean catch   And decide on next step.  ? If you should have a fu ct scan of kidney s or not. The urine sx seem   To be from functional    Continue the prune juice for preventing constipation.  Or use the align. Your labs are good today .  Look    into advance directive .   Caution with alprazolam  As needed   Yearly  Flu  vaccine . Advise look into advance directive .     Standley Brooking. Anelly Samarin M.D.

## 2015-01-17 NOTE — Patient Instructions (Addendum)
Bring in a new urine   Clean catch   And decide on next step.  ? If you should have a fu ct scan of kidney s or not. The urine sx seem   To be from functional    Continue the prune juice for preventing constipation.  Or use the align. Your labs are good today .  Look    into advance directive .   Caution with alprazolam  As needed   Yearly  Flu  vaccine . Advise look into advance directive .

## 2015-01-18 ENCOUNTER — Other Ambulatory Visit (INDEPENDENT_AMBULATORY_CARE_PROVIDER_SITE_OTHER): Payer: Medicare Other

## 2015-01-18 DIAGNOSIS — R8290 Unspecified abnormal findings in urine: Secondary | ICD-10-CM

## 2015-01-18 LAB — POCT URINALYSIS DIPSTICK
Bilirubin, UA: NEGATIVE
GLUCOSE UA: NEGATIVE
Ketones, UA: NEGATIVE
Nitrite, UA: NEGATIVE
PROTEIN UA: NEGATIVE
SPEC GRAV UA: 1.02
Urobilinogen, UA: 0.2
pH, UA: 5.5

## 2015-01-19 ENCOUNTER — Encounter: Payer: Self-pay | Admitting: Internal Medicine

## 2015-01-19 DIAGNOSIS — N281 Cyst of kidney, acquired: Secondary | ICD-10-CM | POA: Insufficient documentation

## 2015-01-26 ENCOUNTER — Other Ambulatory Visit: Payer: Self-pay | Admitting: Family Medicine

## 2015-01-26 DIAGNOSIS — R319 Hematuria, unspecified: Secondary | ICD-10-CM

## 2015-02-14 ENCOUNTER — Other Ambulatory Visit (INDEPENDENT_AMBULATORY_CARE_PROVIDER_SITE_OTHER): Payer: Medicare Other

## 2015-02-14 DIAGNOSIS — R946 Abnormal results of thyroid function studies: Secondary | ICD-10-CM | POA: Diagnosis not present

## 2015-02-14 DIAGNOSIS — R7989 Other specified abnormal findings of blood chemistry: Secondary | ICD-10-CM

## 2015-02-14 LAB — T4, FREE: FREE T4: 0.84 ng/dL (ref 0.60–1.60)

## 2015-02-14 LAB — TSH: TSH: 2.69 u[IU]/mL (ref 0.35–4.50)

## 2015-02-15 LAB — THYROID ANTIBODIES

## 2015-02-22 DIAGNOSIS — R311 Benign essential microscopic hematuria: Secondary | ICD-10-CM | POA: Diagnosis not present

## 2015-02-22 DIAGNOSIS — R350 Frequency of micturition: Secondary | ICD-10-CM | POA: Diagnosis not present

## 2015-04-05 ENCOUNTER — Other Ambulatory Visit: Payer: Self-pay | Admitting: Internal Medicine

## 2015-04-06 NOTE — Telephone Encounter (Signed)
Called to the pharmacy and left on machine. 

## 2015-04-06 NOTE — Telephone Encounter (Signed)
Last refill early octo 2016 Ok to refill x 1

## 2015-04-18 DIAGNOSIS — M7061 Trochanteric bursitis, right hip: Secondary | ICD-10-CM | POA: Diagnosis not present

## 2015-04-18 DIAGNOSIS — M1711 Unilateral primary osteoarthritis, right knee: Secondary | ICD-10-CM | POA: Diagnosis not present

## 2015-05-15 DIAGNOSIS — M170 Bilateral primary osteoarthritis of knee: Secondary | ICD-10-CM | POA: Diagnosis not present

## 2015-05-15 DIAGNOSIS — M19072 Primary osteoarthritis, left ankle and foot: Secondary | ICD-10-CM | POA: Diagnosis not present

## 2015-05-23 DIAGNOSIS — H524 Presbyopia: Secondary | ICD-10-CM | POA: Diagnosis not present

## 2015-05-23 DIAGNOSIS — H353131 Nonexudative age-related macular degeneration, bilateral, early dry stage: Secondary | ICD-10-CM | POA: Diagnosis not present

## 2015-05-23 DIAGNOSIS — Z961 Presence of intraocular lens: Secondary | ICD-10-CM | POA: Diagnosis not present

## 2015-05-25 DIAGNOSIS — D485 Neoplasm of uncertain behavior of skin: Secondary | ICD-10-CM | POA: Diagnosis not present

## 2015-05-25 DIAGNOSIS — L570 Actinic keratosis: Secondary | ICD-10-CM | POA: Diagnosis not present

## 2015-05-25 DIAGNOSIS — L918 Other hypertrophic disorders of the skin: Secondary | ICD-10-CM | POA: Diagnosis not present

## 2015-05-25 DIAGNOSIS — L812 Freckles: Secondary | ICD-10-CM | POA: Diagnosis not present

## 2015-05-25 DIAGNOSIS — L821 Other seborrheic keratosis: Secondary | ICD-10-CM | POA: Diagnosis not present

## 2015-05-25 DIAGNOSIS — C44311 Basal cell carcinoma of skin of nose: Secondary | ICD-10-CM | POA: Diagnosis not present

## 2015-05-25 DIAGNOSIS — Z85828 Personal history of other malignant neoplasm of skin: Secondary | ICD-10-CM | POA: Diagnosis not present

## 2015-05-25 DIAGNOSIS — L820 Inflamed seborrheic keratosis: Secondary | ICD-10-CM | POA: Diagnosis not present

## 2015-05-31 ENCOUNTER — Encounter: Payer: Self-pay | Admitting: Internal Medicine

## 2015-05-31 ENCOUNTER — Ambulatory Visit (INDEPENDENT_AMBULATORY_CARE_PROVIDER_SITE_OTHER): Payer: Medicare Other | Admitting: Internal Medicine

## 2015-05-31 VITALS — BP 154/80 | Temp 98.6°F | Ht 59.25 in | Wt 164.7 lb

## 2015-05-31 DIAGNOSIS — J329 Chronic sinusitis, unspecified: Secondary | ICD-10-CM

## 2015-05-31 DIAGNOSIS — J309 Allergic rhinitis, unspecified: Secondary | ICD-10-CM

## 2015-05-31 DIAGNOSIS — K219 Gastro-esophageal reflux disease without esophagitis: Secondary | ICD-10-CM | POA: Diagnosis not present

## 2015-05-31 MED ORDER — DOXYCYCLINE HYCLATE 100 MG PO TABS: 100.0000 mg | ORAL_TABLET | Freq: Two times a day (BID) | ORAL | Status: DC

## 2015-05-31 MED ORDER — RANITIDINE HCL 150 MG PO CAPS: 150.0000 mg | ORAL_CAPSULE | Freq: Two times a day (BID) | ORAL | Status: DC

## 2015-05-31 NOTE — Patient Instructions (Signed)
Restart the flonase as we are in allergy .  Season  Take every day in s eason   in addition to the saline .  Begin antibiotic    Expect to be better in 5 days or less .   Sinusitis, Adult Sinusitis is redness, soreness, and inflammation of the paranasal sinuses. Paranasal sinuses are air pockets within the bones of your face. They are located beneath your eyes, in the middle of your forehead, and above your eyes. In healthy paranasal sinuses, mucus is able to drain out, and air is able to circulate through them by way of your nose. However, when your paranasal sinuses are inflamed, mucus and air can become trapped. This can allow bacteria and other germs to grow and cause infection. Sinusitis can develop quickly and last only a short time (acute) or continue over a long period (chronic). Sinusitis that lasts for more than 12 weeks is considered chronic. CAUSES Causes of sinusitis include:  Allergies.  Structural abnormalities, such as displacement of the cartilage that separates your nostrils (deviated septum), which can decrease the air flow through your nose and sinuses and affect sinus drainage.  Functional abnormalities, such as when the small hairs (cilia) that line your sinuses and help remove mucus do not work properly or are not present. SIGNS AND SYMPTOMS Symptoms of acute and chronic sinusitis are the same. The primary symptoms are pain and pressure around the affected sinuses. Other symptoms include:  Upper toothache.  Earache.  Headache.  Bad breath.  Decreased sense of smell and taste.  A cough, which worsens when you are lying flat.  Fatigue.  Fever.  Thick drainage from your nose, which often is green and may contain pus (purulent).  Swelling and warmth over the affected sinuses. DIAGNOSIS Your health care provider will perform a physical exam. During your exam, your health care provider may perform any of the following to help determine if you have acute  sinusitis or chronic sinusitis:  Look in your nose for signs of abnormal growths in your nostrils (nasal polyps).  Tap over the affected sinus to check for signs of infection.  View the inside of your sinuses using an imaging device that has a light attached (endoscope). If your health care provider suspects that you have chronic sinusitis, one or more of the following tests may be recommended:  Allergy tests.  Nasal culture. A sample of mucus is taken from your nose, sent to a lab, and screened for bacteria.  Nasal cytology. A sample of mucus is taken from your nose and examined by your health care provider to determine if your sinusitis is related to an allergy. TREATMENT Most cases of acute sinusitis are related to a viral infection and will resolve on their own within 10 days. Sometimes, medicines are prescribed to help relieve symptoms of both acute and chronic sinusitis. These may include pain medicines, decongestants, nasal steroid sprays, or saline sprays. However, for sinusitis related to a bacterial infection, your health care provider will prescribe antibiotic medicines. These are medicines that will help kill the bacteria causing the infection. Rarely, sinusitis is caused by a fungal infection. In these cases, your health care provider will prescribe antifungal medicine. For some cases of chronic sinusitis, surgery is needed. Generally, these are cases in which sinusitis recurs more than 3 times per year, despite other treatments. HOME CARE INSTRUCTIONS  Drink plenty of water. Water helps thin the mucus so your sinuses can drain more easily.  Use a humidifier.  Inhale steam 3-4 times a day (for example, sit in the bathroom with the shower running).  Apply a warm, moist washcloth to your face 3-4 times a day, or as directed by your health care provider.  Use saline nasal sprays to help moisten and clean your sinuses.  Take medicines only as directed by your health care  provider.  If you were prescribed either an antibiotic or antifungal medicine, finish it all even if you start to feel better. SEEK IMMEDIATE MEDICAL CARE IF:  You have increasing pain or severe headaches.  You have nausea, vomiting, or drowsiness.  You have swelling around your face.  You have vision problems.  You have a stiff neck.  You have difficulty breathing.   This information is not intended to replace advice given to you by your health care provider. Make sure you discuss any questions you have with your health care provider.   Document Released: 03/18/2005 Document Revised: 04/08/2014 Document Reviewed: 04/02/2011 Elsevier Interactive Patient Education [employer].

## 2015-05-31 NOTE — Progress Notes (Signed)
Pre visit review using our clinic review tool, if applicable. No additional management support is needed unless otherwise documented below in the visit note.  Chief Complaint  Patient presents with  . Sinus Pressure  . Ear Pain  . Nasal Congestion  . Post Nasal Drip  . Dizziness    HPI: Patient Ashley Lawson  comes in today for SDA for  new problem evaluation. Sinus infection not getting better  Couple weeks of saline nose spray . Sinus problem  Right side clogged  Right face pressure   A lot of the time.  Some allergy sx  ... Then ears began  hurting   No fever.   Cough drainage  ROS: See pertinent positives and negatives per HPI. Gets reflux  at times ?asks if ranitidine ok and can rex  Not on med at this time   Uses align no help   Past Medical History  Diagnosis Date  . Allergic rhinitis   . Hyperlipidemia   . Osteoporosis     dexa 2011 -2.7 hip nl spine  . IBS (irritable bowel syndrome)     constipation predominant  . Diverticulosis of colon   . Abdominal pain 12/2009    hospitalized  . Internal hemorrhoids   . Hepatic steatosis   . Hyperplastic colon polyp   . Polymyalgia rheumatica ([place])   . Dog bite(E906.0) 08/30/2011  . PMR (polymyalgia rheumatica) ([facility]) 08/30/2011    under rheum care  on low dose pred 5 mg     Family History  Problem Relation Age of Onset  . Other Mother     fx hip  . Other Sister     fx hip  . Bladder Cancer Brother     Social History   Social History  . Marital Status: Widowed    Spouse Name: N/A  . Number of Children: N/A  . Years of Education: N/A   Social History Main Topics  . Smoking status: Never Smoker   . Smokeless tobacco: Never Used  . Alcohol Use: Yes  . Drug Use: None  . Sexual Activity: Not Asked   Other Topics Concern  . None   Social History Narrative   Retired   Regular exercise- yes   Widowed   At home with children and GKs    HH of 2     Puppy   Plays cards is social and active         EXAM:  BP 154/80 mmHg  Temp(Src) 98.6 F (37 C) (Oral)  Ht 4' 11.25" (1.505 m)  Wt 164 lb 11.2 oz (74.707 kg)  BMI 32.98 kg/m2  Body mass index is 32.98 kg/(m^2).  GENERAL: vitals reviewed and listed above, alert, oriented, appears well hydrated and in no acute distress HEENT: atraumatic, conjunctiva  clear, no obvious abnormalities on inspection of external nose and ears  tms clear  Nose congested  Right maxilla tender  OP : no lesion edema or exudate  NECK: no obvious masses on inspection palpation  LUNGS: clear to auscultation bilaterally, no wheezes, rales or rhonchi, good air movement CV: HRRR, no clubbing cyanosis or  peripheral edema nl cap refill  MS: moves all extremities without noticeable focal  Abnormality has djd  PSYCH: pleasant and cooperative, no obvious depression or anxiety  ASSESSMENT AND PLAN:  Discussed the following assessment and plan:  Sinusitis, unspecified chronicity, unspecified location - righ max   allt o amox  doxy w caution and  add flonase  Allergic rhinitis,  unspecified allergic rhinitis type  Gastroesophageal reflux disease, esophagitis presence not specified - can restart  ranitidine   -Patient advised to return or notify health care team  if symptoms worsen ,persist or new concerns arise.  Patient Instructions  Restart the flonase as we are in allergy .  Season  Take every day in s eason   in addition to the saline .  Begin antibiotic    Expect to be better in 5 days or less .   Sinusitis, Adult Sinusitis is redness, soreness, and inflammation of the paranasal sinuses. Paranasal sinuses are air pockets within the bones of your face. They are located beneath your eyes, in the middle of your forehead, and above your eyes. In healthy paranasal sinuses, mucus is able to drain out, and air is able to circulate through them by way of your nose. However, when your paranasal sinuses are inflamed, mucus and air can become trapped. This can  allow bacteria and other germs to grow and cause infection. Sinusitis can develop quickly and last only a short time (acute) or continue over a long period (chronic). Sinusitis that lasts for more than 12 weeks is considered chronic. CAUSES Causes of sinusitis include:  Allergies.  Structural abnormalities, such as displacement of the cartilage that separates your nostrils (deviated septum), which can decrease the air flow through your nose and sinuses and affect sinus drainage.  Functional abnormalities, such as when the small hairs (cilia) that line your sinuses and help remove mucus do not work properly or are not present. SIGNS AND SYMPTOMS Symptoms of acute and chronic sinusitis are the same. The primary symptoms are pain and pressure around the affected sinuses. Other symptoms include:  Upper toothache.  Earache.  Headache.  Bad breath.  Decreased sense of smell and taste.  A cough, which worsens when you are lying flat.  Fatigue.  Fever.  Thick drainage from your nose, which often is green and may contain pus (purulent).  Swelling and warmth over the affected sinuses. DIAGNOSIS Your health care provider will perform a physical exam. During your exam, your health care provider may perform any of the following to help determine if you have acute sinusitis or chronic sinusitis:  Look in your nose for signs of abnormal growths in your nostrils (nasal polyps).  Tap over the affected sinus to check for signs of infection.  View the inside of your sinuses using an imaging device that has a light attached (endoscope). If your health care provider suspects that you have chronic sinusitis, one or more of the following tests may be recommended:  Allergy tests.  Nasal culture. A sample of mucus is taken from your nose, sent to a lab, and screened for bacteria.  Nasal cytology. A sample of mucus is taken from your nose and examined by your health care provider to determine if  your sinusitis is related to an allergy. TREATMENT Most cases of acute sinusitis are related to a viral infection and will resolve on their own within 10 days. Sometimes, medicines are prescribed to help relieve symptoms of both acute and chronic sinusitis. These may include pain medicines, decongestants, nasal steroid sprays, or saline sprays. However, for sinusitis related to a bacterial infection, your health care provider will prescribe antibiotic medicines. These are medicines that will help kill the bacteria causing the infection. Rarely, sinusitis is caused by a fungal infection. In these cases, your health care provider will prescribe antifungal medicine. For some cases of chronic sinusitis, surgery is needed.  Generally, these are cases in which sinusitis recurs more than 3 times per year, despite other treatments. HOME CARE INSTRUCTIONS  Drink plenty of water. Water helps thin the mucus so your sinuses can drain more easily.  Use a humidifier.  Inhale steam 3-4 times a day (for example, sit in the bathroom with the shower running).  Apply a warm, moist washcloth to your face 3-4 times a day, or as directed by your health care provider.  Use saline nasal sprays to help moisten and clean your sinuses.  Take medicines only as directed by your health care provider.  If you were prescribed either an antibiotic or antifungal medicine, finish it all even if you start to feel better. SEEK IMMEDIATE MEDICAL CARE IF:  You have increasing pain or severe headaches.  You have nausea, vomiting, or drowsiness.  You have swelling around your face.  You have vision problems.  You have a stiff neck.  You have difficulty breathing.   This information is not intended to replace advice given to you by your health care provider. Make sure you discuss any questions you have with your health care provider.   Document Released: 03/18/2005 Document Revised: 04/08/2014 Document Reviewed:  04/02/2011 Elsevier Interactive Patient Education 2016 Charlotte K. Panosh M.D.

## 2015-06-13 ENCOUNTER — Encounter: Payer: Self-pay | Admitting: Family Medicine

## 2015-06-13 ENCOUNTER — Ambulatory Visit (INDEPENDENT_AMBULATORY_CARE_PROVIDER_SITE_OTHER): Payer: Medicare Other | Admitting: Family Medicine

## 2015-06-13 VITALS — BP 126/71 | HR 83 | Temp 98.4°F | Ht 59.25 in | Wt 166.0 lb

## 2015-06-13 DIAGNOSIS — N390 Urinary tract infection, site not specified: Secondary | ICD-10-CM | POA: Diagnosis not present

## 2015-06-13 LAB — POC URINALSYSI DIPSTICK (AUTOMATED)
Bilirubin, UA: NEGATIVE
Glucose, UA: NEGATIVE
Ketones, UA: NEGATIVE
NITRITE UA: NEGATIVE
PH UA: 6
Spec Grav, UA: 1.025
UROBILINOGEN UA: 0.2

## 2015-06-13 MED ORDER — NITROFURANTOIN MONOHYD MACRO 100 MG PO CAPS: 100.0000 mg | ORAL_CAPSULE | Freq: Two times a day (BID) | ORAL | Status: DC

## 2015-06-13 NOTE — Progress Notes (Signed)
Pre visit review using our clinic review tool, if applicable. No additional management support is needed unless otherwise documented below in the visit note. 

## 2015-06-13 NOTE — Progress Notes (Signed)
   Subjective:    Patient ID: Ashley Lawson, female    DOB: June 13, 1932, 82 y.o.   MRN: [id number]  HPI Here for 2 days of urgency to urinate and burning. No fever.    Review of Systems  Constitutional: Negative.   Respiratory: Negative.   Cardiovascular: Negative.   Gastrointestinal: Negative.   Genitourinary: Positive for dysuria, urgency and frequency. Negative for flank pain and pelvic pain.       Objective:   Physical Exam  Constitutional: She appears well-developed and well-nourished.  Abdominal: Soft. Bowel sounds are normal. She exhibits no distension and no mass. There is no tenderness. There is no rebound and no guarding.          Assessment & Plan:  UTI, treat with Macrobid. Drink plenty of water. Culture the sample.

## 2015-06-13 NOTE — Addendum Note (Signed)
Addended by: Aggie Hacker A on: 06/13/2015 03:35 PM   Modules accepted: Orders

## 2015-06-15 LAB — URINE CULTURE
COLONY COUNT: NO GROWTH
ORGANISM ID, BACTERIA: NO GROWTH

## 2015-06-20 ENCOUNTER — Telehealth: Payer: Self-pay | Admitting: Internal Medicine

## 2015-06-20 DIAGNOSIS — Z0000 Encounter for general adult medical examination without abnormal findings: Secondary | ICD-10-CM | POA: Diagnosis not present

## 2015-06-20 DIAGNOSIS — R3129 Other microscopic hematuria: Secondary | ICD-10-CM | POA: Diagnosis not present

## 2015-06-20 DIAGNOSIS — R3989 Other symptoms and signs involving the genitourinary system: Secondary | ICD-10-CM | POA: Diagnosis not present

## 2015-06-20 NOTE — Telephone Encounter (Signed)
Noted  

## 2015-06-20 NOTE — Telephone Encounter (Signed)
Patient Name: Ashley Lawson DOB: 06/27/1932 Initial Comment caller states she may have a UTI- is having abd pain Nurse Assessment Nurse: Vallery Sa, RN, Tye [place] Date/Time (Eastern Time): 06/20/2015 1:35:17 PM Confirm and document reason for call. If symptomatic, describe symptoms. You must click the next button to save text entered. ---Caller states she started antibioics on 3/14 for a bladder infection and continues to have lower abdominal pain. (Pain rated as a 6 on the 1 to 10 scale). No fever. No blood in her urine. Alert and responsive. Has the patient traveled out of the country within the last 30 days? ---No Does the patient have any new or worsening symptoms? ---Yes Will a triage be completed? ---Yes Related visit to physician within the last 2 weeks? ---No Does the PT have any chronic conditions? (i.e. diabetes, asthma, etc.) ---Yes List chronic conditions. ---UTI, Basal Cell carcinoma to be removed next week Is this a behavioral health or substance abuse call? ---No Guidelines Guideline Title Affirmed Question Affirmed Notes Urinary Tract Infection on Antibiotic Follow-up Call - Female [1] Side (flank) or lower back pain AND [2] new onset since starting antibiotics Final Disposition User See Physician within 4 Hours (or PCP triage) Vallery Sa, RN, [place] Comments Unable to schedule appointment. She will go to urgent care facility or follow up with her renal MD. Referrals [facility] Disagree/Comply: Comply

## 2015-06-28 DIAGNOSIS — C44311 Basal cell carcinoma of skin of nose: Secondary | ICD-10-CM | POA: Diagnosis not present

## 2015-07-17 ENCOUNTER — Other Ambulatory Visit: Payer: Self-pay

## 2015-07-17 DIAGNOSIS — Z1231 Encounter for screening mammogram for malignant neoplasm of breast: Secondary | ICD-10-CM

## 2015-07-31 DIAGNOSIS — H1132 Conjunctival hemorrhage, left eye: Secondary | ICD-10-CM | POA: Diagnosis not present

## 2015-08-15 ENCOUNTER — Ambulatory Visit
Admission: RE | Admit: 2015-08-15 | Discharge: 2015-08-15 | Disposition: A | Discharge status: Home / Self Care | Payer: Medicare Other | Source: Ambulatory Visit

## 2015-08-15 DIAGNOSIS — Z1231 Encounter for screening mammogram for malignant neoplasm of breast: Secondary | ICD-10-CM | POA: Diagnosis not present

## 2015-11-21 ENCOUNTER — Encounter: Payer: Self-pay | Admitting: Internal Medicine

## 2015-11-21 DIAGNOSIS — L821 Other seborrheic keratosis: Secondary | ICD-10-CM | POA: Diagnosis not present

## 2015-11-21 DIAGNOSIS — L820 Inflamed seborrheic keratosis: Secondary | ICD-10-CM | POA: Diagnosis not present

## 2015-11-21 DIAGNOSIS — L570 Actinic keratosis: Secondary | ICD-10-CM | POA: Diagnosis not present

## 2015-11-21 DIAGNOSIS — Z85828 Personal history of other malignant neoplasm of skin: Secondary | ICD-10-CM | POA: Diagnosis not present

## 2016-01-10 ENCOUNTER — Other Ambulatory Visit: Payer: Self-pay | Admitting: Internal Medicine

## 2016-01-12 NOTE — Telephone Encounter (Signed)
Can refill x 1

## 2016-01-12 NOTE — Telephone Encounter (Signed)
Called to the pharmacy and left on machine. 

## 2016-01-13 DIAGNOSIS — N390 Urinary tract infection, site not specified: Secondary | ICD-10-CM | POA: Diagnosis not present

## 2016-01-13 DIAGNOSIS — R319 Hematuria, unspecified: Secondary | ICD-10-CM | POA: Diagnosis not present

## 2016-01-16 ENCOUNTER — Telehealth: Payer: Self-pay | Admitting: Family Medicine

## 2016-01-16 NOTE — Telephone Encounter (Signed)
Ov and copy of any labs or culture tests that may have been done Thanks

## 2016-01-16 NOTE — Telephone Encounter (Signed)
Spoke to the pt.  She reports that she seen at the [facility] and given sulfamethoxazole trimethoprim for a UTI.  Continues to have back pain.  Would like to have her urine rechecked.  Pt has completed antibiotics.  Pt scheduled for 01/18/16.  Will forward to [facility].

## 2016-01-17 NOTE — Telephone Encounter (Signed)
Received a copy of note from [facility] and pt scheduled to see [facility] on 01/18/16.

## 2016-01-17 NOTE — Progress Notes (Signed)
Chief Complaint  Patient presents with  . Follow-up    HPI: Ashley Lawson 82 y.o.    Had uti rx   Septra  Ds     And  Was a lot better  After  1-2 days .   May have done culture  .    But persistent abd back sx  Uncertain if infection all gone  Records showed leuk and blood    [facility]  And told to fu  pcp office.   asksa bout constipation avoidance using prunes and  Prune juice  Going ok this week   Having residula or ? Predated  rlb pain and soreness left abdomen  Has hx of ibs and such soreness and has diverticula.   Hx of recurrent uti last one in spring  macrobid rx  Need flu vaccine.  ROS: See pertinent positives and negatives per HPI. Has hemorrhoids no gi bleeding vomitng fever chills at present  Past Medical History:  Diagnosis Date  . Abdominal pain 12/2009   hospitalized  . Allergic rhinitis   . Diverticulosis of colon   . Dog bite(E906.0) 08/30/2011  . Hepatic steatosis   . Hyperlipidemia   . Hyperplastic colon polyp   . IBS (irritable bowel syndrome)    constipation predominant  . Internal hemorrhoids   . Osteoporosis    dexa 2011 -2.7 hip nl spine  . PMR (polymyalgia rheumatica) ([facility]) 08/30/2011   under rheum care  on low dose pred 5 mg   . Polymyalgia rheumatica ([facility])     Family History  Problem Relation Age of Onset  . Other Mother     fx hip  . Other Sister     fx hip  . Bladder Cancer Brother     Social History   Social History  . Marital status: Widowed    Spouse name: N/A  . Number of children: N/A  . Years of education: N/A   Social History Main Topics  . Smoking status: Never Smoker  . Smokeless tobacco: Never Used  . Alcohol use 0.0 oz/week     Comment: occ  . Drug use: Unknown  . Sexual activity: Not Asked   Other Topics Concern  . None   Social History Narrative   Retired   Regular exercise- yes   Widowed   At home with children and GKs    HH of 2     Puppy   Plays cards is social and active      Outpatient Medications Prior to Visit  Medication Sig Dispense Refill  . ALPRAZolam (XANAX) 0.25 MG tablet TAKE 1 TABLET BY MOUTH AT BEDTIME AS NEEDED 30 tablet 0  . Ascorbic Acid (VITAMIN C) 100 MG tablet Take 100 mg by mouth daily.      . calcium-vitamin D (OSCAL WITH D) 500-200 MG-UNIT per tablet Take 1 tablet by mouth daily.      . cetirizine (ZYRTEC) 10 MG tablet Take 10 mg by mouth daily.    . fish oil-omega-3 fatty acids 1000 MG capsule Take 2 g by mouth daily.      . fluticasone (FLONASE) 50 MCG/ACT nasal spray 2 spray each nostril qd 16 g 3  . loratadine (CLARITIN) 10 MG tablet Take 10 mg by mouth daily.    . MULTIPLE VITAMIN PO Take by mouth.      . nitrofurantoin, macrocrystal-monohydrate, (MACROBID) 100 MG capsule Take 1 capsule (100 mg total) by mouth 2 (two) times daily.  14 capsule 0  . Probiotic Product (ALIGN PO) Take 1 tablet by mouth daily.    . ranitidine (ZANTAC) 150 MG capsule Take 1 capsule (150 mg total) by mouth 2 (two) times daily. For reflux heartburn 60 capsule 3  . RESTASIS 0.05 % ophthalmic emulsion Place 1 drop into both eyes 2 (two) times daily.  3  . traMADol (ULTRAM) 50 MG tablet Take 50 mg by mouth every 6 (six) hours as needed for pain.     No facility-administered medications prior to visit.      EXAM:  BP 102/78 (BP Location: Left Arm, Patient Position: Standing, Cuff Size: Normal)   Temp 97.9 F (36.6 C) (Oral)   Ht 4' 11.25" (1.505 m)   Wt 167 lb 8 oz (76 kg)   BMI 33.55 kg/m   Body mass index is 33.55 kg/m.  GENERAL: vitals reviewed and listed above, alert, oriented, appears well hydrated and in no acute distress HEENT: atraumatic, conjunctiva  clear, no obvious abnormalities on inspection of external nose and ears  NECK: no obvious masses on inspection palpation  LUNGS: clear to auscultation bilaterally, no wheezes, rales or rhonchi, good air movement CV: HRRR, no clubbing cyanosis or  peripheral edema nl cap refill  Abdomen:  Sof,t  normal bowel soun  sorenss suprapubic ( says better) and left colon gutter  Without mass or rebound    no hepatosplenomegaly, no guarding rebound or masses no CVA tenderness MS: moves all extremities without noticeable focal  abnormality PSYCH: pleasant and cooperative, no obvious depression or anxiety Lab Results  Component Value Date   WBC 5.5 01/11/2015   HGB 13.3 01/11/2015   HCT 40.0 01/11/2015   PLT 256.0 01/11/2015   GLUCOSE 90 01/11/2015   CHOL 201 (H) 01/11/2015   TRIG 136.0 01/11/2015   HDL 55.40 01/11/2015   LDLDIRECT 125.8 02/23/2013   LDLCALC 119 (H) 01/11/2015   ALT 22 01/11/2015   AST 24 01/11/2015   NA 140 01/11/2015   K 4.6 01/11/2015   CL 105 01/11/2015   CREATININE 0.85 01/11/2015   BUN 10 01/11/2015   CO2 27 01/11/2015   TSH 2.69 02/14/2015    ASSESSMENT AND PLAN:  Discussed the following assessment and plan:  Abdominal pain, unspecified abdominal location - Plan: POCT urinalysis dipstick, Urine culture  Recurrent UTI - Plan: POCT urinalysis dipstick, Urine culture  Need for immunization against influenza - Plan: Flu vaccine HIGH DOSE PF Patient states she cannot produce a specimen while in the clinic is had that problem a long time and doctor's office will bring back specimen today for urinalysis and culture. Prescription written for Cipro 5 days benefit more than risk unless she is better and her urine is clear. If symptoms get worse start the antibiotic. States she is not constipated now wants to avoid it because of her previous problem when she was in the hospital. Total visit 60mns > 50% spent counseling and coordinating care as indicated in above note and in instructions to patient .     -Patient advised to return or notify health care team  if symptoms worsen ,persist or new concerns arise.  Patient Instructions  Get urinalysis and urine culture specimen today .    Consider  rx with cipro antibiotic  Good for  Some UTIs and diverticulitis type  germs .    Can try metamucil and  Fiber otc  For constipation   In addition to prunes .       WStandley Brooking  Panosh M.D.  See ua pos blood trx leuk cx to be sent? Advice take med

## 2016-01-18 ENCOUNTER — Ambulatory Visit (INDEPENDENT_AMBULATORY_CARE_PROVIDER_SITE_OTHER): Payer: Medicare Other | Admitting: Internal Medicine

## 2016-01-18 ENCOUNTER — Encounter: Payer: Self-pay | Admitting: Internal Medicine

## 2016-01-18 VITALS — BP 102/78 | Temp 97.9°F | Ht 59.25 in | Wt 167.5 lb

## 2016-01-18 DIAGNOSIS — R109 Unspecified abdominal pain: Secondary | ICD-10-CM | POA: Diagnosis not present

## 2016-01-18 DIAGNOSIS — Z23 Encounter for immunization: Secondary | ICD-10-CM

## 2016-01-18 DIAGNOSIS — N390 Urinary tract infection, site not specified: Secondary | ICD-10-CM

## 2016-01-18 LAB — POCT URINALYSIS DIPSTICK
BILIRUBIN UA: NEGATIVE
GLUCOSE UA: NEGATIVE
Ketones, UA: NEGATIVE
NITRITE UA: NEGATIVE
Protein, UA: NEGATIVE
Spec Grav, UA: 1.015
UROBILINOGEN UA: 0.2
pH, UA: 5.5

## 2016-01-18 MED ORDER — CIPROFLOXACIN HCL 500 MG PO TABS: 500.0000 mg | ORAL_TABLET | Freq: Two times a day (BID) | ORAL | 0 refills | Status: AC

## 2016-01-18 NOTE — Progress Notes (Signed)
Pre visit review using our clinic review tool, if applicable. No additional management support is needed unless otherwise documented below in the visit note. 

## 2016-01-18 NOTE — Patient Instructions (Addendum)
Get urinalysis and urine culture specimen today .    Consider  rx with cipro antibiotic  Good for  Some UTIs and diverticulitis type germs .    Can try metamucil and  Fiber otc  For constipation   In addition to prunes .

## 2016-01-20 LAB — URINE CULTURE: Organism ID, Bacteria: NO GROWTH

## 2016-01-22 LAB — HEPATIC FUNCTION PANEL
ALBUMIN: 4.6 g/dL (ref 3.5–5.2)
ALT: 26 U/L (ref 0–35)
AST: 26 U/L (ref 0–37)
Alkaline Phosphatase: 71 U/L (ref 39–117)
BILIRUBIN TOTAL: 0.6 mg/dL (ref 0.2–1.2)
Bilirubin, Direct: 0.1 mg/dL (ref 0.0–0.3)
TOTAL PROTEIN: 7.2 g/dL (ref 6.0–8.3)

## 2016-01-22 LAB — LIPID PANEL
CHOLESTEROL: 203 mg/dL — AB (ref 0–200)
HDL: 67.8 mg/dL (ref >39.00)
LDL CALC: 110 mg/dL — AB (ref 0–99)
NonHDL: 135.28
Total CHOL/HDL Ratio: 3
Triglycerides: 126 mg/dL (ref 0.0–149.0)
VLDL: 25.2 mg/dL (ref 0.0–40.0)

## 2016-01-22 LAB — CBC WITH DIFFERENTIAL/PLATELET
BASOS PCT: 0.6 % (ref 0.0–3.0)
Basophils Absolute: 0 10*3/uL (ref 0.0–0.1)
EOS PCT: 1.5 % (ref 0.0–5.0)
Eosinophils Absolute: 0.1 10*3/uL (ref 0.0–0.7)
HCT: 40.1 % (ref 36.0–46.0)
HEMOGLOBIN: 13.6 g/dL (ref 12.0–15.0)
Lymphocytes Relative: 30.6 % (ref 12.0–46.0)
Lymphs Abs: 1.8 10*3/uL (ref 0.7–4.0)
MCHC: 33.9 g/dL (ref 30.0–36.0)
MCV: 93.3 fl (ref 78.0–100.0)
MONO ABS: 0.4 10*3/uL (ref 0.1–1.0)
MONOS PCT: 7.7 % (ref 3.0–12.0)
Neutro Abs: 3.4 10*3/uL (ref 1.4–7.7)
Neutrophils Relative %: 59.6 % (ref 43.0–77.0)
Platelets: 266 10*3/uL (ref 150.0–400.0)
RBC: 4.3 Mil/uL (ref 3.87–5.11)
RDW: 13.4 % (ref 11.5–15.5)
WBC: 5.7 10*3/uL (ref 4.0–10.5)

## 2016-01-22 LAB — BASIC METABOLIC PANEL
BUN: 8 mg/dL (ref 6–23)
CHLORIDE: 100 meq/L (ref 96–112)
CO2: 29 meq/L (ref 19–32)
Calcium: 9.7 mg/dL (ref 8.4–10.5)
Creatinine, Ser: 0.8 mg/dL (ref 0.40–1.20)
GFR: 73.07 mL/min (ref >60.00)
GLUCOSE: 91 mg/dL (ref 70–99)
POTASSIUM: 4.9 meq/L (ref 3.5–5.1)
SODIUM: 135 meq/L (ref 135–145)

## 2016-01-22 LAB — TSH: TSH: 4.18 u[IU]/mL (ref 0.35–4.50)

## 2016-01-24 ENCOUNTER — Other Ambulatory Visit (INDEPENDENT_AMBULATORY_CARE_PROVIDER_SITE_OTHER): Payer: Medicare Other

## 2016-01-24 DIAGNOSIS — Z0000 Encounter for general adult medical examination without abnormal findings: Secondary | ICD-10-CM | POA: Diagnosis not present

## 2016-01-30 DIAGNOSIS — R3982 Chronic bladder pain: Secondary | ICD-10-CM | POA: Diagnosis not present

## 2016-01-30 DIAGNOSIS — R3121 Asymptomatic microscopic hematuria: Secondary | ICD-10-CM | POA: Diagnosis not present

## 2016-01-30 DIAGNOSIS — R8271 Bacteriuria: Secondary | ICD-10-CM | POA: Diagnosis not present

## 2016-01-30 NOTE — Progress Notes (Signed)
Pre visit review using our clinic review tool, if applicable. No additional management support is needed unless otherwise documented below in the visit note.  Chief Complaint  Patient presents with  . Medicare Wellness    HPI: Ashley Lawson 82 y.o. comes in today for Preventive Medicare wellness visit .Since last visit.   Feels  Bloated   And no dc feels yucky     Still llq discomfort   Nocturia  Gets gas a lot has hemorrhoids  ocass blood on tissue but no blood in stool.  Has OA  Rare [place] of alprazolam but did use recently  Taking ranitidine  For acid  ? If should continue  Saw urology office yesterday but no exam and  Urine clear cx pending    Health Maintenance  Topic Date Due  . MAMMOGRAM  08/14/2017  . TETANUS/TDAP  08/29/2021  . INFLUENZA VACCINE  Completed  . DEXA SCAN  Completed  . ZOSTAVAX  Completed  . PNA vac Low Risk Adult  Completed   Health Maintenance Review LIFESTYLE:  Sugar beverages: not recenetly  Tea  Sleep:  11-7-8   But nocturia  3    MEDICARE DOCUMENT QUESTIONS  TO SCAN   Hearing: ok  Vision:  No limitations at present . Last eye check UTD  Safety:  Has smoke detector and wears seat belts.  No firearms. No excess sun exposure. Sees dentist regularly.  Falls: no  Advance directive :  Reviewed  Working on it with family   Memory: Felt to be good  , no concern from her or her family.  Depression: No anhedonia unusual crying or depressive symptoms  Nutrition: Eats well balanced diet; adequate calcium and vitamin D. No swallowing chewing problems.  Injury: no major injuries in the last six months.  Other healthcare providers:  Reviewed today .  Social:  Lives w daughter puppy.   Preventive parameters: up-to-date  Reviewed   ADLS:   There are no problems or need for assistance  driving, feeding, obtaining food, dressing, toileting and bathing, managing money using phone. She is independent.    ROS: see above  GEN/ HEENT: No fever,  significant weight changes sweats headaches vision problems hearing changes, CV/ PULM; No chest pain shortness of breath cough, syncope,edema  change in exercise tolerance. GI /GU: No , vomiting, change in bowel habits. No blood in the stool. No significant GU symptoms. SKIN/HEME: ,no acute skin rashes suspicious lesions or bleeding. No lymphadenopathy, nodules, masses.  NEURO/ PSYCH:  No neurologic signs such as weakness numbness. No depression anxiety. IMM/ Allergy: No unusual infections.  Allergy .   REST of 12 system review negative except as per HPI   Past Medical History:  Diagnosis Date  . Abdominal pain 12/2009   hospitalized  . Allergic rhinitis   . Diverticulosis of colon   . Dog bite(E906.0) 08/30/2011  . Hepatic steatosis   . Hyperlipidemia   . Hyperplastic colon polyp   . IBS (irritable bowel syndrome)    constipation predominant  . Internal hemorrhoids   . Osteoporosis    dexa 2011 -2.7 hip nl spine  . PMR (polymyalgia rheumatica) (HCC) 08/30/2011   under rheum care  on low dose pred 5 mg   . Polymyalgia rheumatica (HCC)     Family History  Problem Relation Age of Onset  . Other Mother     fx hip  . Other Sister     fx hip  . Bladder Cancer Brother  Social History   Social History  . Marital status: Widowed    Spouse name: N/A  . Number of children: N/A  . Years of education: N/A   Social History Main Topics  . Smoking status: Never Smoker  . Smokeless tobacco: Never Used  . Alcohol use 0.0 oz/week     Comment: occ  . Drug use: Unknown  . Sexual activity: Not Asked   Other Topics Concern  . None   Social History Narrative   Retired   Regular exercise- yes   Widowed   At home with children and GKs    HH of 2     Puppy   Plays cards is social and active     Outpatient Encounter Prescriptions as of 01/31/2016  Medication Sig  . ALPRAZolam (XANAX) 0.25 MG tablet TAKE 1 TABLET BY MOUTH AT BEDTIME AS NEEDED  . Ascorbic Acid (VITAMIN C) 100  MG tablet Take 100 mg by mouth daily.    . calcium-vitamin D (OSCAL WITH D) 500-200 MG-UNIT per tablet Take 1 tablet by mouth daily.    . fish oil-omega-3 fatty acids 1000 MG capsule Take 2 g by mouth daily.    . fluticasone (FLONASE) 50 MCG/ACT nasal spray 2 spray each nostril qd  . loratadine (CLARITIN) 10 MG tablet Take 10 mg by mouth daily.  . MULTIPLE VITAMIN PO Take by mouth.    . Probiotic Product (ALIGN PO) Take 1 tablet by mouth daily.  . ranitidine (ZANTAC) 150 MG capsule Take 1 capsule (150 mg total) by mouth 2 (two) times daily. For reflux heartburn  . RESTASIS 0.05 % ophthalmic emulsion Place 1 drop into both eyes 2 (two) times daily.  . [DISCONTINUED] cetirizine (ZYRTEC) 10 MG tablet Take 10 mg by mouth daily.  . [DISCONTINUED] nitrofurantoin, macrocrystal-monohydrate, (MACROBID) 100 MG capsule Take 1 capsule (100 mg total) by mouth 2 (two) times daily.  . [DISCONTINUED] traMADol (ULTRAM) 50 MG tablet Take 50 mg by mouth every 6 (six) hours as needed for pain.   No facility-administered encounter medications on file as of 01/31/2016.     EXAM:  BP 140/70 (BP Location: Right Arm, Patient Position: Sitting, Cuff Size: Normal)   Temp 98 F (36.7 C) (Oral)   Ht 4' 11.5" (1.511 m)   Wt 168 lb 9.6 oz (76.5 kg)   BMI 33.48 kg/m   Body mass index is 33.48 kg/m.  Physical Exam: Vital signs reviewed PZW:CHEN is a well-developed well-nourished alert cooperative   who appears stated age in no acute distress.  HEENT: normocephalic atraumatic , Eyes: PERRL EOM's full, conjunctiva clear, Nares: paten,t no deformity discharge or tenderness., Ears: no deformity EAC's clear TMs with normal landmarks. Mouth: clear OP, no lesions, edema.  Moist mucous membranes. Dentition in adequate repair. NECK: supple without masses, thyromegaly or bruits. CHEST/PULM:  Clear to auscultation and percussion breath sounds equal no wheeze , rales or rhonchi. No chest wall deformities or tenderness. CV: PMI  is nondisplaced, S1 S2 no gallops, murmurs, rubs. Peripheral pulses are full without delay.No JVD .  ABDOMEN: Bowel sounds normal nontender  No guard or rebound, no hepato splenomegal no CVA tenderness.  llq tenderness but no masses  Extremtities:  No clubbing cyanosis or edema, no acute joint swelling or redness no focal atrophy DJD changes   NEURO:  Oriented x3, cranial nerves 3-12 appear to be intact, no obvious focal weakness,gait within normal limits no abnormal reflexes or asymmetrical SKIN: No acute rashes normal turgor, color, no  bruising or petechiae. PSYCH: Oriented, good eye contact, no obvious depression anxiety, cognition and judgment appear normal. LN: no cervical axillary  adenopathy No noted deficits in memory, attention, and speech.   Lab Results  Component Value Date   WBC 5.7 01/22/2016   HGB 13.6 01/22/2016   HCT 40.1 01/22/2016   PLT 266.0 01/22/2016   GLUCOSE 91 01/22/2016   CHOL 203 (H) 01/22/2016   TRIG 126.0 01/22/2016   HDL 67.80 01/22/2016   LDLDIRECT 125.8 02/23/2013   LDLCALC 110 (H) 01/22/2016   ALT 26 01/22/2016   AST 26 01/22/2016   NA 135 01/22/2016   K 4.9 01/22/2016   CL 100 01/22/2016   CREATININE 0.80 01/22/2016   BUN 8 01/22/2016   CO2 29 01/22/2016   TSH 4.18 01/22/2016   Lab reviewed  ASSESSMENT AND PLAN:  Discussed the following assessment and plan:  Visit for preventive health examination  Medicare annual wellness visit, subsequent  LLQ abdominal pain - onset with uti not totally better but urine clear  check scan  hx of renal cyst indidental findings - Plan: Ambulatory referral to Gastroenterology, CT ABDOMEN PELVIS W WO CONTRAST  Medication management  Irritable bowel syndrome, unspecified type  Lower urinary tract symptoms (LUTS) - Plan: CT ABDOMEN PELVIS W WO CONTRAST  Gastroesophageal reflux disease, esophagitis presence not specified - Plan: Ambulatory referral to Gastroenterology  Renal cyst incidental  - Plan: [place]  Patient Care Team: Burnis Medin, MD as PCP - General Bjorn Loser, MD (Urology) Particia Nearing, MD (Dermatology) Luberta Mutter, MD (Ophthalmology) Almedia Balls, MD (Orthopedic Surgery) Ouida Sills  (Rheumatology) Jarome Matin, MD as Consulting Physician (Dermatology)  Patient Instructions  Going to refer you  to a new GI  Stay on the align and ranitidine .   Urine  Sounds like infecrtion  gone but because you have persistent lower abd pain  We will  arrange for a  Scan of your abdomen also.  To check kidneys and bowel area   Your labs blood tests are good.   Plan wellness check up yearly but  Plan fu on the abd pain depending on  The result of the scan and Gi consult and how you are doing.      Food Choices for Gastroesophageal Reflux Disease, Adult When you have gastroesophageal reflux disease (GERD), the foods you eat and your eating habits are very important. Choosing the right foods can help ease the discomfort of GERD. WHAT GENERAL GUIDELINES DO I NEED TO FOLLOW?  Choose fruits, vegetables, whole grains, low-fat dairy products, and low-fat meat, fish, and poultry.  Limit fats such as oils, salad dressings, butter, nuts, and avocado.  Keep a food diary to identify foods that cause symptoms.  Avoid foods that cause reflux. These may be different for different people.  Eat frequent small meals instead of three large meals each day.  Eat your meals slowly, in a relaxed setting.  Limit fried foods.  Cook foods using methods other than frying.  Avoid drinking alcohol.  Avoid drinking large amounts of liquids with your meals.  Avoid bending over or lying down until 2-3 hours after eating. WHAT FOODS ARE NOT RECOMMENDED? The following are some foods and drinks that may worsen your symptoms: Vegetables Tomatoes. Tomato juice. Tomato and spaghetti sauce. Chili peppers. Onion and garlic. Horseradish. Fruits Oranges, grapefruit, and  lemon (fruit and juice). Meats High-fat meats, fish, and poultry. This includes hot dogs, ribs, ham, sausage, salami, and  bacon. Dairy Whole milk and chocolate milk. Sour cream. Cream. Butter. Ice cream. Cream cheese.  Beverages Coffee and tea, with or without caffeine. Carbonated beverages or energy drinks. Condiments Hot sauce. Barbecue sauce.  Sweets/Desserts Chocolate and cocoa. Donuts. Peppermint and spearmint. Fats and Oils High-fat foods, including [place] fries and potato chips. Other Vinegar. Strong spices, such as black pepper, white pepper, red pepper, cayenne, curry powder, cloves, ginger, and chili powder. The items listed above may not be a complete list of foods and beverages to avoid. Contact your dietitian for more information.   This information is not intended to replace advice given to you by your health care provider. Make sure you discuss any questions you have with your health care provider.   Document Released: 03/18/2005 Document Revised: 04/08/2014 Document Reviewed: 01/20/2013 Elsevier Interactive Patient Education 2016 Roy K. Dorman Calderwood M.D.

## 2016-01-31 ENCOUNTER — Encounter: Payer: Self-pay | Admitting: Internal Medicine

## 2016-01-31 ENCOUNTER — Ambulatory Visit (INDEPENDENT_AMBULATORY_CARE_PROVIDER_SITE_OTHER): Payer: Medicare Other | Admitting: Internal Medicine

## 2016-01-31 VITALS — BP 140/70 | Temp 98.0°F | Ht 59.5 in | Wt 168.6 lb

## 2016-01-31 DIAGNOSIS — R1032 Left lower quadrant pain: Secondary | ICD-10-CM | POA: Diagnosis not present

## 2016-01-31 DIAGNOSIS — Z79899 Other long term (current) drug therapy: Secondary | ICD-10-CM

## 2016-01-31 DIAGNOSIS — K219 Gastro-esophageal reflux disease without esophagitis: Secondary | ICD-10-CM

## 2016-01-31 DIAGNOSIS — R399 Unspecified symptoms and signs involving the genitourinary system: Secondary | ICD-10-CM

## 2016-01-31 DIAGNOSIS — Z0001 Encounter for general adult medical examination with abnormal findings: Secondary | ICD-10-CM

## 2016-01-31 DIAGNOSIS — K589 Irritable bowel syndrome without diarrhea: Secondary | ICD-10-CM

## 2016-01-31 DIAGNOSIS — Z0000 Encounter for general adult medical examination without abnormal findings: Secondary | ICD-10-CM

## 2016-01-31 DIAGNOSIS — N281 Cyst of kidney, acquired: Secondary | ICD-10-CM

## 2016-01-31 NOTE — Patient Instructions (Addendum)
Going to refer you  to a new GI  Stay on the align and ranitidine .   Urine  Sounds like infecrtion  gone but because you have persistent lower abd pain  We will  arrange for a  Scan of your abdomen also.  To check kidneys and bowel area   Your labs blood tests are good.   Plan wellness check up yearly but  Plan fu on the abd pain depending on  The result of the scan and Gi consult and how you are doing.      Food Choices for Gastroesophageal Reflux Disease, Adult When you have gastroesophageal reflux disease (GERD), the foods you eat and your eating habits are very important. Choosing the right foods can help ease the discomfort of GERD. WHAT GENERAL GUIDELINES DO I NEED TO FOLLOW?  Choose fruits, vegetables, whole grains, low-fat dairy products, and low-fat meat, fish, and poultry.  Limit fats such as oils, salad dressings, butter, nuts, and avocado.  Keep a food diary to identify foods that cause symptoms.  Avoid foods that cause reflux. These may be different for different people.  Eat frequent small meals instead of three large meals each day.  Eat your meals slowly, in a relaxed setting.  Limit fried foods.  Cook foods using methods other than frying.  Avoid drinking alcohol.  Avoid drinking large amounts of liquids with your meals.  Avoid bending over or lying down until 2-3 hours after eating. WHAT FOODS ARE NOT RECOMMENDED? The following are some foods and drinks that may worsen your symptoms: Vegetables Tomatoes. Tomato juice. Tomato and spaghetti sauce. Chili peppers. Onion and garlic. Horseradish. Fruits Oranges, grapefruit, and lemon (fruit and juice). Meats High-fat meats, fish, and poultry. This includes hot dogs, ribs, ham, sausage, salami, and bacon. Dairy Whole milk and chocolate milk. Sour cream. Cream. Butter. Ice cream. Cream cheese.  Beverages Coffee and tea, with or without caffeine. Carbonated beverages or energy drinks. Condiments Hot sauce.  Barbecue sauce.  Sweets/Desserts Chocolate and cocoa. Donuts. Peppermint and spearmint. Fats and Oils High-fat foods, including [place] fries and potato chips. Other Vinegar. Strong spices, such as black pepper, white pepper, red pepper, cayenne, curry powder, cloves, ginger, and chili powder. The items listed above may not be a complete list of foods and beverages to avoid. Contact your dietitian for more information.   This information is not intended to replace advice given to you by your health care provider. Make sure you discuss any questions you have with your health care provider.   Document Released: 03/18/2005 Document Revised: 04/08/2014 Document Reviewed: 01/20/2013 Elsevier Interactive Patient Education [employer].

## 2016-02-02 ENCOUNTER — Telehealth: Payer: Self-pay | Admitting: Internal Medicine

## 2016-02-02 ENCOUNTER — Encounter: Payer: Self-pay | Admitting: Nurse Practitioner

## 2016-02-02 ENCOUNTER — Ambulatory Visit (INDEPENDENT_AMBULATORY_CARE_PROVIDER_SITE_OTHER): Payer: Medicare Other | Admitting: Nurse Practitioner

## 2016-02-02 VITALS — BP 116/70 | HR 80 | Ht 59.25 in | Wt 167.2 lb

## 2016-02-02 DIAGNOSIS — R1032 Left lower quadrant pain: Secondary | ICD-10-CM | POA: Diagnosis not present

## 2016-02-02 MED ORDER — METRONIDAZOLE 500 MG PO TABS: 500.0000 mg | ORAL_TABLET | Freq: Three times a day (TID) | ORAL | 0 refills | Status: DC

## 2016-02-02 MED ORDER — CIPROFLOXACIN HCL 500 MG PO TABS: 500.0000 mg | ORAL_TABLET | Freq: Two times a day (BID) | ORAL | 0 refills | Status: DC

## 2016-02-02 NOTE — Patient Instructions (Signed)
We have sent in Cipro and Flagyl to your pharmacy to take for 7 days                                    [place] GUIDELINES DO I NEED TO FOLLOW?  Choose fruits, vegetables, whole grains, low-fat dairy products, and low-fat meat, fish, and poultry.  Limit fats such as oils, salad dressings, butter, nuts, and avocado.  Keep a food diary to identify foods that cause symptoms.  Avoid foods that cause reflux. These may be different for different people.  Eat frequent small meals instead of three large meals each day.  Eat your meals slowly, in a relaxed setting.  Limit fried foods.  Cook foods using methods other than frying.  Avoid drinking alcohol.  Avoid drinking large amounts of liquids with your meals.  Avoid bending over or lying down until 2-3 hours after eating. WHAT FOODS ARE NOT RECOMMENDED? The following are some foods and drinks that may worsen your symptoms: Vegetables Tomatoes. Tomato juice. Tomato and spaghetti sauce. Chili peppers. Onion and garlic. Horseradish. Fruits Oranges, grapefruit, and lemon (fruit and juice). Meats High-fat meats, fish, and poultry. This includes hot dogs, ribs, ham, sausage, salami, and bacon. Dairy Whole milk and chocolate milk. Sour cream. Cream. Butter. Ice cream. Cream cheese.  Beverages Coffee and tea, with or without caffeine. Carbonated beverages or energy drinks. Condiments Hot sauce. Barbecue sauce.  Sweets/Desserts Chocolate and cocoa. Donuts. Peppermint and spearmint. Fats and Oils High-fat foods, including [place] fries and potato chips. Other Vinegar. Strong spices, such as black pepper, white pepper, red pepper, cayenne, curry powder, cloves, ginger, and chili powder. The items listed above may not be a complete list of foods and beverages to avoid. Contact your dietitian for more information.   This information is not intended to replace advice given to you by your health care provider.  Make sure you discuss any questions you have with your health care provider.   Document Released: 03/18/2005 Document Revised: 04/08/2014 Document Reviewed: 01/20/2013 Elsevier Interactive Patient Education 2016 [place]

## 2016-02-02 NOTE — Progress Notes (Signed)
     HPI: Patient is an 82 year old female previously known to Dr. Olevia Perches. She has a history of irritable bowel syndrome. At one time it was thought that patient's brother had colon cancer but apparently it was bladder cancer instead. Patient was seen [facility] October, diagnosed with UTI and treated with Septra. She had recurrent lower abdominal discomfort, saw her PCP late October. Labs unremarkable. Patient treated empirically with Cipro, urine sent for culture. Patient was called by PCP a couple of days later, advised discontinue Cipro as culture was negative. She had a follow-up with PCP a couple of days ago. Still having left lower quadrant discomfort despite normal urinalysis. Asian is scheduled for CT scan of the abdomen and pelvis on Tuesday, she was referred to [place] for further evaluation in the interim. Patient states her bowels move fine with prunes and prune juice. She's had no fevers or chills. The left lower quadrant discomfort did get better while on antibiotics and the suprapubic discomfort resolved. The LLQ pain is still lingering. It is constant, unrelieved with bowel movements. Moving around is uncomfortable for her. No dysuria or vaginal symptoms. No fevers or chills. Patient has had similar left lower quadrant pain before but not to this degree   Past Medical History:  Diagnosis Date  . Abdominal pain 12/2009   hospitalized  . Allergic rhinitis   . Diverticulosis of colon   . Dog bite(E906.0) 08/30/2011  . GERD (gastroesophageal reflux disease)   . Hepatic steatosis   . Hyperlipidemia   . Hyperplastic colon polyp   . IBS (irritable bowel syndrome)    constipation predominant  . Internal hemorrhoids   . Osteoporosis    dexa 2011 -2.7 hip nl spine  . PMR (polymyalgia rheumatica) (HCC) 08/30/2011   under rheum care  on low dose pred 5 mg   . Polymyalgia rheumatica (HCC)     Patient's surgical history, family medical history, social history, medications and allergies  were all reviewed in Epic    Physical Exam: BP 116/70 (BP Location: Right Arm, Patient Position: Sitting, Cuff Size: Normal)   Pulse 80   Ht 4' 11.25" (1.505 m) Comment: without shoes  Wt 167 lb 4 oz (75.9 kg)   BMI 33.49 kg/m   GENERAL: well developed white female in NAD PSYCH: :Pleasant, cooperative, normal affect HEENT: Normocephalic, conjunctiva pink, mucous membranes moist, neck supple without masses CARDIAC:  RRR, no murmur heard,  No peripheral edema PULM: Normal respiratory effort, lungs CTA bilaterally, no wheezing ABDOMEN:  soft, obese, nondistended. Moderate LLQ tenderness. No obvious masses, no hepatomegaly,  normal bowel sounds SKIN:  turgor, no lesions seen Musculoskeletal:  Normal muscle tone, normal strength NEURO: Alert and oriented x 3, no focal neurologic deficits   ASSESSMENT and PLAN:   82 year old female with LLQ pain, improved after antibiotics given empirically for UTI (she had a few days of Septra and 2-3 days of Cipro). Urine culture negative LLQ pain persists. Moderate LLQ tenderness on exam.  -known diverticulosis, suspect she may have diverticulitis. She is up-to-date on colon cancer screening, last colonoscopy May 2016. -Await CTscan of abd /pelvis scheduled PCP for Tuesday .  -In the interim will start Cipro / flagyl. Patient will call early next week with update and to discuss CTscan -bland diet for next couple of days  Will ask Dr. Loletha Carrow to assume patient's care since Dr. Olevia Perches has retired.       Tye Savoy  02/02/2016, 2:06 PM

## 2016-02-02 NOTE — Telephone Encounter (Signed)
Pt would like to speak to Dr Regis Bill about the visit today she had with a PA at [facility]. (Dr Regis Bill referred her to them) Pt states the PA put her on 2 abx , one in which Dr Regis Bill had taken her off of. Pt states she is a little nervous about taking 2 meds, and if Dr Regis Bill could look at this and get back with her, she would feel better about things.

## 2016-02-05 ENCOUNTER — Encounter (HOSPITAL_COMMUNITY): Payer: Self-pay | Admitting: Emergency Medicine

## 2016-02-05 ENCOUNTER — Telehealth: Payer: Self-pay | Admitting: Internal Medicine

## 2016-02-05 ENCOUNTER — Emergency Department (HOSPITAL_COMMUNITY)
Admission: EM | Admit: 2016-02-05 | Discharge: 2016-02-06 | Disposition: A | Discharge status: Home / Self Care | Payer: Medicare Other | Attending: Emergency Medicine | Admitting: Emergency Medicine

## 2016-02-05 DIAGNOSIS — Z85828 Personal history of other malignant neoplasm of skin: Secondary | ICD-10-CM | POA: Insufficient documentation

## 2016-02-05 DIAGNOSIS — E871 Hypo-osmolality and hyponatremia: Secondary | ICD-10-CM | POA: Diagnosis not present

## 2016-02-05 DIAGNOSIS — R197 Diarrhea, unspecified: Secondary | ICD-10-CM | POA: Diagnosis not present

## 2016-02-05 DIAGNOSIS — R1084 Generalized abdominal pain: Secondary | ICD-10-CM | POA: Diagnosis not present

## 2016-02-05 DIAGNOSIS — R0602 Shortness of breath: Secondary | ICD-10-CM | POA: Diagnosis not present

## 2016-02-05 DIAGNOSIS — K5790 Diverticulosis of intestine, part unspecified, without perforation or abscess without bleeding: Secondary | ICD-10-CM | POA: Diagnosis not present

## 2016-02-05 LAB — URINALYSIS, ROUTINE W REFLEX MICROSCOPIC
BILIRUBIN URINE: NEGATIVE
Glucose, UA: NEGATIVE mg/dL
KETONES UR: NEGATIVE mg/dL
NITRITE: NEGATIVE
PH: 6.5 (ref 5.0–8.0)
Protein, ur: NEGATIVE mg/dL
Specific Gravity, Urine: 1.007 (ref 1.005–1.030)

## 2016-02-05 LAB — URINE MICROSCOPIC-ADD ON
Bacteria, UA: NONE SEEN
RBC / HPF: NONE SEEN RBC/hpf (ref 0–5)
WBC UA: NONE SEEN WBC/hpf (ref 0–5)

## 2016-02-05 NOTE — ED Notes (Signed)
Writer was unsuccessful blood work 2X

## 2016-02-05 NOTE — Telephone Encounter (Signed)
Discussed taking the antibiotics Cipro and Flagyl with food. She has been afraid to eat. She states"she didn't want me to eat much". Discussed low residue diet and importance of hydration. Confirmed the CT instructions for the patient after calling [facility]. She will return the oral contrast. Arrive at 1:00pm and NPO solid foods for 4 hours prior to testing which scheduled to start at 2:00 pm. Patient states her daughter was contacted with the same information and thanked me for calling.

## 2016-02-05 NOTE — ED Triage Notes (Signed)
Pt recently diagnosed with a bladder infection. Pt placed on Metronidazole and Ciprofloxacin and whenever she takes the medication she gets headaches, SOB, generalized pain and "irritation."  Pt sts she can no longer take medication. Denies SOB, headaches, etc. At this time sts that those symptoms only occur right after taking medications.  Pt sts she is also supposed to get a CT abdomen tomorrow and is hoping she can get one here. Pt sts this is to get a better look at her bladder/rule out diverticulitis. Pt c/o LLQ abdominal pain, intermittent.

## 2016-02-05 NOTE — Telephone Encounter (Signed)
Spoke to the pt.  She stated she is having dizziness, nausea, vomiting and headache.  Wanted to know if she needed to continue medication or change medication.  Advised that any medication changes will need to come from GI. Reiterated instructions given earlier today by LPN.  Informed pt that I will send another message to GI.  Pt agrees to wait and receive instructions.

## 2016-02-05 NOTE — Telephone Encounter (Signed)
Taken from GI Note  ASSESSMENT and PLAN:   82 year old female with LLQ pain, improved after antibiotics given empirically for UTI (she had a few days of Septra and 2-3 days of Cipro). Urine culture negative LLQ pain persists. Moderate LLQ tenderness on exam.  -known diverticulosis, suspect she may have diverticulitis. She is up-to-date on colon cancer screening, last colonoscopy May 2016. -Await CTscan of abd /pelvis scheduled PCP for Tuesday .  -In the interim will start Cipro / flagyl. Patient will call early next week with update and to discuss CTscan -bland diet for next couple of days  Will ask Dr. Loletha Carrow to assume patient's care since Dr. Olevia Perches has retired.   Please advise.  Thanks!!

## 2016-02-05 NOTE — Telephone Encounter (Signed)
Ok to do this  thye are treating you in case you have some diverticulitis  Need to get the scan done

## 2016-02-06 ENCOUNTER — Encounter (HOSPITAL_COMMUNITY): Payer: Self-pay

## 2016-02-06 ENCOUNTER — Emergency Department (HOSPITAL_COMMUNITY): Payer: Medicare Other

## 2016-02-06 ENCOUNTER — Inpatient Hospital Stay: Admission: RE | Admit: 2016-02-06 | Payer: Medicare Other | Source: Ambulatory Visit

## 2016-02-06 DIAGNOSIS — R0602 Shortness of breath: Secondary | ICD-10-CM | POA: Diagnosis not present

## 2016-02-06 DIAGNOSIS — K5790 Diverticulosis of intestine, part unspecified, without perforation or abscess without bleeding: Secondary | ICD-10-CM | POA: Diagnosis not present

## 2016-02-06 LAB — COMPREHENSIVE METABOLIC PANEL
ALT: 37 U/L (ref 14–54)
ANION GAP: 11 (ref 5–15)
AST: 48 U/L — ABNORMAL HIGH (ref 15–41)
Albumin: 4.5 g/dL (ref 3.5–5.0)
Alkaline Phosphatase: 67 U/L (ref 38–126)
BILIRUBIN TOTAL: 1.3 mg/dL — AB (ref 0.3–1.2)
BUN: 7 mg/dL (ref 6–20)
CO2: 21 mmol/L — ABNORMAL LOW (ref 22–32)
Calcium: 9.3 mg/dL (ref 8.9–10.3)
Chloride: 93 mmol/L — ABNORMAL LOW (ref 101–111)
Creatinine, Ser: 0.79 mg/dL (ref 0.44–1.00)
Glucose, Bld: 96 mg/dL (ref 65–99)
POTASSIUM: 2.9 mmol/L — AB (ref 3.5–5.1)
Sodium: 125 mmol/L — ABNORMAL LOW (ref 135–145)
TOTAL PROTEIN: 7.7 g/dL (ref 6.5–8.1)

## 2016-02-06 LAB — CBC
HEMATOCRIT: 37.9 % (ref 36.0–46.0)
HEMOGLOBIN: 13.6 g/dL (ref 12.0–15.0)
MCH: 31.6 pg (ref 26.0–34.0)
MCHC: 35.9 g/dL (ref 30.0–36.0)
MCV: 87.9 fL (ref 78.0–100.0)
Platelets: 256 10*3/uL (ref 150–400)
RBC: 4.31 MIL/uL (ref 3.87–5.11)
RDW: 12.3 % (ref 11.5–15.5)
WBC: 9.3 10*3/uL (ref 4.0–10.5)

## 2016-02-06 LAB — C DIFFICILE QUICK SCREEN W PCR REFLEX
C DIFFICILE (CDIFF) INTERP: NOT DETECTED
C DIFFICILE (CDIFF) TOXIN: NEGATIVE
C DIFFICLE (CDIFF) ANTIGEN: NEGATIVE

## 2016-02-06 LAB — LIPASE, BLOOD: Lipase: 31 U/L (ref 11–51)

## 2016-02-06 MED ORDER — SODIUM CHLORIDE 0.9 % IV BOLUS (SEPSIS)
1000.0000 mL | Freq: Once | INTRAVENOUS | Status: AC
  Administered 2016-02-06: 1000 mL via INTRAVENOUS

## 2016-02-06 MED ORDER — ONDANSETRON HCL 4 MG/2ML IJ SOLN
4.0000 mg | Freq: Once | INTRAMUSCULAR | Status: AC
  Administered 2016-02-06: 4 mg via INTRAVENOUS
  Filled 2016-02-06: qty 2

## 2016-02-06 MED ORDER — IOPAMIDOL (ISOVUE-300) INJECTION 61%
100.0000 mL | Freq: Once | INTRAVENOUS | Status: AC | PRN
  Administered 2016-02-06: 100 mL via INTRAVENOUS

## 2016-02-06 MED ORDER — POTASSIUM CHLORIDE CRYS ER 20 MEQ PO TBCR
40.0000 meq | EXTENDED_RELEASE_TABLET | Freq: Once | ORAL | Status: AC
  Administered 2016-02-06: 40 meq via ORAL
  Filled 2016-02-06: qty 2

## 2016-02-06 MED ORDER — FENTANYL CITRATE (PF) 100 MCG/2ML IJ SOLN
50.0000 ug | Freq: Once | INTRAMUSCULAR | Status: AC
  Administered 2016-02-06: 50 ug via INTRAVENOUS
  Filled 2016-02-06: qty 2

## 2016-02-06 NOTE — ED Notes (Addendum)
One of pts pills ( k+ 20 mili eq  ) fell on floor had to pull an extra k+ pill to replace it. Pt had a total of 40 mEq as ordered. Draw count will be one less.

## 2016-02-06 NOTE — Telephone Encounter (Signed)
Ashley Lawson, This patient went to the ER. She has had her CT scan. The results are in. She was given the results in the ER.

## 2016-02-06 NOTE — Telephone Encounter (Signed)
[name],  I will forward this message to PCP. I reviewed CTscan, no diverticulitis. Since Cipro making her sick anyway then she can stop the antibiotics. Maybe this is just IBS. We can try bentyl 23m BID as needed for a few days and see if that helps, #20. Thanks - PNevin Bloodgood

## 2016-02-06 NOTE — ED Provider Notes (Signed)
[facility] DEPT Provider Note   CSN: [id number] Arrival date & time: 02/05/16  1818  By signing my name below, I, Emmanuella Mensah, attest that this documentation has been prepared under the direction and in the presence of Ripley Fraise, MD. Electronically Signed: Judithann Sauger, ED Scribe. 02/06/16. 1:51 AM.   History   Chief Complaint Chief Complaint  Patient presents with  . Cystitis  . Diarrhea    HPI Comments: Ashley Lawson is a 82 y.o. female with a hx of HLD, diverticulosis, IBS, and GERD who presents to the Emergency Department complaining of multiple episodes of moderate non-bloody yellow colored diarrhea onset 4 days ago s/p taking Metronidazole and Cipro. She reports associated worsening moderate generalized abdominal pain she describes as cramping. She states that she was initially diagnosed with a UTI one month ago and has been evaluated by her GI for possible diverticulitis; pt has an upcoming CT abdomen later on today. She adds that she has noticed that her symptoms began after taking the medications. She reports that she has been having urinary frequency, generalized headache, and has been having intermittent episodes of SOB. She adds that she is currently on a bland diet and has increased her fluid intake. Pt has not tried any medications for these symptoms PTA. She has an allergy to Alendronate sodium and Amoxicillin. She denies any fever, chills, or any other symptoms.   The history is provided by the patient. No language interpreter was used.  Diarrhea   This is a new problem. The current episode started more than 2 days ago. The problem has been gradually worsening. The stool consistency is described as watery. There has been no fever. Associated symptoms include abdominal pain, vomiting and headaches. Pertinent negatives include no cough. She has tried nothing for the symptoms. Her past medical history is significant for irritable bowel syndrome.    Past  Medical History:  Diagnosis Date  . Abdominal pain 12/2009   hospitalized  . Allergic rhinitis   . Diverticulosis of colon   . Dog bite(E906.0) 08/30/2011  . GERD (gastroesophageal reflux disease)   . Hepatic steatosis   . Hyperlipidemia   . Hyperplastic colon polyp   . IBS (irritable bowel syndrome)    constipation predominant  . Internal hemorrhoids   . Osteoporosis    dexa 2011 -2.7 hip nl spine  . PMR (polymyalgia rheumatica) (HCC) 08/30/2011   under rheum care  on low dose pred 5 mg   . Polymyalgia rheumatica [facility])     Patient Active Problem List   Diagnosis Date Noted  . Rhinitis, allergic 05/31/2015  . Renal cyst incidental  01/19/2015  . Heartburn 01/14/2014  . Gastroesophageal reflux disease without esophagitis 01/14/2014  . Osteoporosis 01/14/2014  . IBS (irritable bowel syndrome)   . Nail, ingrown 06/22/2012  . Arthritis 02/19/2012  . Screening, lipid 02/18/2011  . Rash and nonspecific skin eruption 02/18/2011  . Visit for preventive health examination 02/18/2011  . Skin cancer 10/14/2010  . Abdominal bloating 10/08/2010  . CONSTIPATION, CHRONIC 01/15/2010  . Lower urinary tract symptoms (LUTS) 01/15/2010  . SUPRAPUBIC PAIN 08/01/2009  . ABDOMINAL PAIN, EPIGASTRIC 06/16/2009  . DIVERTICULOSIS, COLON 06/07/2007  . Hyperlipidemia 06/05/2007  . ANXIETY STATE, UNSPECIFIED 06/05/2007  . Allergic rhinitis 06/05/2007  . IBS 06/05/2007  . OSTEOPOROSIS 06/05/2007  . ABDOMINAL PAIN, GENERALIZED 06/05/2007    Past Surgical History:  Procedure Laterality Date  . ABDOMINAL HYSTERECTOMY    . CATARACT EXTRACTION  2010   both  OB History    No data available       Home Medications    Prior to Admission medications   Medication Sig Start Date End Date Taking? Authorizing Provider  acetaminophen (TYLENOL) 500 MG tablet Take 500 mg by mouth every 6 (six) hours as needed for mild pain, moderate pain or headache.   Yes Historical Provider, MD  Ascorbic Acid  (VITAMIN C) 100 MG tablet Take 100 mg by mouth daily.     Yes Historical Provider, MD  calcium carbonate (OS-CAL) 1250 (500 Ca) MG chewable tablet Chew 1 tablet by mouth 2 (two) times daily.   Yes Historical Provider, MD  ciprofloxacin (CIPRO) 500 MG tablet Take 1 tablet (500 mg total) by mouth 2 (two) times daily. 02/02/16  Yes Willia Craze, NP  fish oil-omega-3 fatty acids 1000 MG capsule Take 1 g by mouth daily.    Yes Historical Provider, MD  fluticasone (FLONASE) 50 MCG/ACT nasal spray 2 spray each nostril qd Patient taking differently: Place 2 sprays into both nostrils daily as needed for allergies.  06/16/12  Yes Burnis Medin, MD  loratadine (CLARITIN) 10 MG tablet Take 10 mg by mouth daily.   Yes Historical Provider, MD  metroNIDAZOLE (FLAGYL) 500 MG tablet Take 1 tablet (500 mg total) by mouth 3 (three) times daily. For 7 days 02/02/16  Yes Willia Craze, NP  MULTIPLE VITAMIN PO Take 1 tablet by mouth daily.    Yes Historical Provider, MD  Probiotic Product (ALIGN PO) Take 1 tablet by mouth daily.   Yes Historical Provider, MD  RESTASIS 0.05 % ophthalmic emulsion Place 1 drop into both eyes 2 (two) times daily. 10/26/14  Yes Historical Provider, MD  ALPRAZolam Duanne Moron) 0.25 MG tablet TAKE 1 TABLET BY MOUTH AT BEDTIME AS NEEDED Patient not taking: Reported on 02/05/2016 01/12/16   Burnis Medin, MD  ranitidine (ZANTAC) 150 MG capsule Take 1 capsule (150 mg total) by mouth 2 (two) times daily. For reflux heartburn Patient not taking: Reported on 02/05/2016 05/31/15   Burnis Medin, MD    Family History Family History  Problem Relation Age of Onset  . Other Mother     fx hip  . Other Sister     fx hip  . Bladder Cancer Brother   . Colon cancer Neg Hx   . Esophageal cancer Neg Hx   . Stomach cancer Neg Hx   . Rectal cancer Neg Hx   . Liver cancer Neg Hx     Social History Social History  Substance Use Topics  . Smoking status: Never Smoker  . Smokeless tobacco: Never Used    . Alcohol use 0.0 oz/week     Comment: occ     Allergies   Alendronate sodium and Amoxicillin   Review of Systems Review of Systems  Respiratory: Negative for cough.   Gastrointestinal: Positive for abdominal pain, diarrhea and vomiting.  Neurological: Positive for headaches.  All other systems reviewed and are negative.    Physical Exam Updated Vital Signs BP 129/80   Pulse (!) 57   Temp 98.4 F (36.9 C) (Oral)   Resp 18   Ht 5' (1.524 m)   SpO2 97%   Physical Exam  Nursing note and vitals reviewed.  CONSTITUTIONAL: Elderly, mildly anxious HEAD: Normocephalic/atraumatic EYES: EOMI/PERRL ENMT: Mucous membranes dry NECK: supple no meningeal signs SPINE/BACK:entire spine nontender CV: S1/S2 noted, no murmurs/rubs/gallops noted LUNGS: Lungs are clear to auscultation bilaterally, no apparent distress ABDOMEN: soft,  diffuse moderate tenderness, no rebound or guarding, bowel sounds noted throughout abdomen GU:no cva tenderness NEURO: Pt is awake/alert/appropriate, moves all extremitiesx4.  No facial droop.   EXTREMITIES: pulses normal/equal, full ROM SKIN: warm, color normal PSYCH: mildly anxious  ED Treatments / Results  DIAGNOSTIC STUDIES: Oxygen Saturation is 97% on RA, adequate by my interpretation.    COORDINATION OF CARE: 1:34 AM- Pt advised of plan for treatment and pt agrees. Pt will receive lab work, chest x-ray, and CT abdomen for further evaluation. She will also receive IV fluids, Zofran IV, and fentanyl IV.    Labs (all labs ordered are listed, but only abnormal results are displayed) Labs Reviewed  COMPREHENSIVE METABOLIC PANEL - Abnormal; Notable for the following:       Result Value   Sodium 125 (*)    Potassium 2.9 (*)    Chloride 93 (*)    CO2 21 (*)    AST 48 (*)    Total Bilirubin 1.3 (*)    All other components within normal limits  URINALYSIS, ROUTINE W REFLEX MICROSCOPIC (NOT AT [facility]) - Abnormal; Notable for the following:    Hgb  urine dipstick MODERATE (*)    Leukocytes, UA TRACE (*)    All other components within normal limits  URINE MICROSCOPIC-ADD ON - Abnormal; Notable for the following:    Squamous Epithelial / LPF 0-5 (*)    All other components within normal limits  C DIFFICILE QUICK SCREEN W PCR REFLEX  LIPASE, BLOOD  CBC    EKG  EKG Interpretation None       Radiology Dg Chest 2 View  Result Date: 02/06/2016 CLINICAL DATA:  Shortness of breath EXAM: CHEST  2 VIEW COMPARISON:  None. FINDINGS: There is atherosclerotic calcification within the aortic arch. Cardiomediastinal contours are otherwise normal. No focal airspace consolidation or pulmonary edema. No pneumothorax or pleural effusion. IMPRESSION: 1. Clear lungs. 2. Aortic atherosclerosis. Electronically Signed   By: Ulyses Jarred M.D.   On: 02/06/2016 02:42   Ct Abdomen Pelvis W Contrast  Result Date: 02/06/2016 CLINICAL DATA:  Abdominal pain. Left lower quadrant pain. Recent bladder infection. EXAM: CT ABDOMEN AND PELVIS WITH CONTRAST TECHNIQUE: Multidetector CT imaging of the abdomen and pelvis was performed using the standard protocol following bolus administration of intravenous contrast. CONTRAST:  162m ISOVUE-300 IOPAMIDOL (ISOVUE-300) INJECTION 61% COMPARISON:  CT abdomen 06/16/2014 FINDINGS: Lower chest: No pulmonary nodules. No visible pleural or pericardial effusion. Hepatobiliary: Calcified granuloma in the hepatic dome. Otherwise, no focal liver lesions. No ascites. No pneumobilia or portal venous gas. Normal gallbladder. Pancreas: Normal pancreatic contours and enhancement. No peripancreatic fluid collection or pancreatic ductal dilatation. Spleen: Normal. Adrenals/Urinary Tract: Normal adrenal glands. No hydronephrosis or solid renal mass. The urinary bladder is more firm Stomach/Bowel: There is rectosigmoid diverticulosis without evidence of acute diverticulitis. No dilated small bowel. No abdominal fluid collection. Despite review of  multiplanar reformatted images in 3 orthogonal planes, the appendix could not be adequately visualized. However, there is no inflammatory stranding or free fluid within the right lower quadrant. Vascular/Lymphatic: There is atherosclerotic calcification of the non aneurysmal abdominal aorta. No abdominal or pelvic adenopathy. Reproductive: Status post hysterectomy. Ovaries are not clearly identified. No adnexal mass. Musculoskeletal: No lytic or blastic osseous lesion. Normal visualized extrathoracic and extraperitoneal soft tissues. Other: No contributory non-categorized findings. IMPRESSION: 1. Rectosigmoid diverticulosis without acute inflammation. 2. No acute abnormality of the abdomen or pelvis. 3. Normal appearance of the urinary bladder. Electronically Signed   By:  Ulyses Jarred M.D.   On: 02/06/2016 02:32    Procedures Procedures (including critical care time)  Medications Ordered in ED Medications  sodium chloride 0.9 % bolus 1,000 mL (0 mLs Intravenous Stopped 02/06/16 0618)  fentaNYL (SUBLIMAZE) injection 50 mcg (50 mcg Intravenous Given 02/06/16 0157)  ondansetron (ZOFRAN) injection 4 mg (4 mg Intravenous Given 02/06/16 0155)  iopamidol (ISOVUE-300) 61 % injection 100 mL (100 mLs Intravenous Contrast Given 02/06/16 0203)  potassium chloride SA (K-DUR,KLOR-CON) CR tablet 40 mEq (40 mEq Oral Given 02/06/16 0501)     Initial Impression / Assessment and Plan / ED Course  Ripley Fraise, MD has reviewed the triage vital signs and the nursing notes.  Pertinent labs & imaging results that were available during my care of the patient were reviewed by me and considered in my medical decision making (see chart for details).  Clinical Course     Ct scan negative Pt improved Pt taking PO fluids She had no diarrhea here to check for cdif Since CT scan negative, I advised to call her GI physician today as it was supposed to be performed as outpatient  I spoke to daughter via phone and gave  update on CT findings and need to f/u with GI today  Final Clinical Impressions(s) / ED Diagnoses   Final diagnoses:  Generalized abdominal pain  Diarrhea, unspecified type    New Prescriptions New Prescriptions   No medications on file   I personally performed the services described in this documentation, which was scribed in my presence. The recorded information has been reviewed and is accurate.        Ripley Fraise, MD 02/06/16 [phone]

## 2016-02-06 NOTE — Progress Notes (Signed)
Thank you for sending this case to me. I have reviewed the entire note, and the outlined plan seems appropriate.  I would be happy to assume her care after Dr Nichola Sizer retirement.

## 2016-02-06 NOTE — Discharge Instructions (Signed)
Abdominal (belly) pain can be caused by many things. any cases can be observed and treated at home after initial evaluation in the emergency department. Even though you are being discharged home, abdominal pain can be unpredictable. Therefore, you need a repeated exam if your pain does not resolve, returns, or worsens. Most patients with abdominal pain don't have to be admitted to the hospital or have surgery, but serious problems like appendicitis and gallbladder attacks can start out as nonspecific pain. Many abdominal conditions cannot be diagnosed in one visit, so follow-up evaluations are very important. SEEK IMMEDIATE MEDICAL ATTENTION IF: The pain does not go away or becomes severe, particularly over the next 8-12 hours.  A temperature above 100.64F develops.  Repeated vomiting occurs (multiple episodes).  The pain becomes localized to portions of the abdomen. The right side could possibly be appendicitis. In an adult, the left lower portion of the abdomen could be colitis or diverticulitis.  Blood is being passed in stools or vomit (bright red or black tarry stools).  Return also if you develop chest pain, difficulty breathing, dizziness or fainting, or become confused, poorly responsive, or inconsolable.

## 2016-02-07 NOTE — Telephone Encounter (Signed)
Spoke with the patient. She thanks me for checking on her. She states she is much better and she did stop the atb's. Her bowel symptoms are gone. She feels she has improved and does not need any other medication at this time.

## 2016-02-08 ENCOUNTER — Telehealth: Payer: Self-pay | Admitting: Nurse Practitioner

## 2016-02-09 ENCOUNTER — Other Ambulatory Visit: Payer: Self-pay

## 2016-02-09 MED ORDER — RANITIDINE HCL 150 MG PO CAPS: 150.0000 mg | ORAL_CAPSULE | Freq: Two times a day (BID) | ORAL | 3 refills | Status: DC

## 2016-02-09 NOTE — Telephone Encounter (Signed)
Spoke with patient about her symptoms. She does not have diarrhea. She can create loose stools by eating prunes. She is not having multiple stools or bloody stools. Sometimes her side feels a "little sore". She will call back for re-evaluation appointment is she fails to improve or develops alarm symptoms.

## 2016-02-12 ENCOUNTER — Telehealth: Payer: Self-pay | Admitting: Nurse Practitioner

## 2016-02-12 NOTE — Telephone Encounter (Signed)
The patient states she is able to move gas out since she is using zantac. Her stools are yellow and soft but only able to move bowels if she eats prunes. Her abdomen is sore. States she does not know how else to describe this. She wants to be re-evaluated. Appointment scheduled for her.

## 2016-02-14 ENCOUNTER — Encounter: Payer: Self-pay | Admitting: Nurse Practitioner

## 2016-02-14 ENCOUNTER — Other Ambulatory Visit: Payer: Medicare Other

## 2016-02-14 ENCOUNTER — Ambulatory Visit (INDEPENDENT_AMBULATORY_CARE_PROVIDER_SITE_OTHER): Payer: Medicare Other | Admitting: Nurse Practitioner

## 2016-02-14 VITALS — BP 154/78 | HR 68 | Ht 59.25 in | Wt 167.1 lb

## 2016-02-14 DIAGNOSIS — R1032 Left lower quadrant pain: Secondary | ICD-10-CM | POA: Diagnosis not present

## 2016-02-14 NOTE — Patient Instructions (Signed)
Your physician has requested that you go to the basement for lab work before leaving today.

## 2016-02-14 NOTE — Progress Notes (Signed)
HPI: Patient is an 82 yo female who I just saw on 02/02/16 for LLQ. She had recently been treated for a UTI. I prescribed cipro and flagyl empirically for diverticulitis pending a CTscan that PCP had already ordered. Patient was having problems tolerating antibiotics. She developed bad headaches and nausea. so I stopped them. Patient went to ED on 02/05/16 for further evaluation. Per ED note patient described 4 days of diarrhea but today she cannot really recall having diarrhea. She did have some vomiting and  worsening left sided abdominal pain as well as intermittent episodes of SOB. Abdominal CTscan was done and unremarkable. Labs as below. Patient released from ED  ED workup  Na+ 125, K+ 2.9 WBC 9.3, hgb 13.6  We spoke with patient on 11/7 following her ED visit. Offered Bentyl but didn't need it, she was feeling better.  Patient called back on 2 days ago, wanted to be seen. She has persistent left sided abdominal pain. Takes prune juice which makes stools loose.    Past Medical History:  Diagnosis Date  . Abdominal pain 12/2009   hospitalized  . Allergic rhinitis   . Diverticulosis of colon   . Dog bite(E906.0) 08/30/2011  . GERD (gastroesophageal reflux disease)   . Hepatic steatosis   . Hyperlipidemia   . Hyperplastic colon polyp   . IBS (irritable bowel syndrome)    constipation predominant  . Internal hemorrhoids   . Osteoporosis    dexa 2011 -2.7 hip nl spine  . PMR (polymyalgia rheumatica) (HCC) 08/30/2011   under rheum care  on low dose pred 5 mg   . Polymyalgia rheumatica (HCC)     Patient's surgical history, family medical history, social history, medications and allergies were all reviewed in Epic    Physical Exam: BP (!) 154/78 (BP Location: Left Arm, Patient Position: Sitting, Cuff Size: Normal)   Pulse 68 Comment: irregular  Ht 4' 11.25" (1.505 m)   Wt 167 lb 2 oz (75.8 kg)   BMI 33.47 kg/m   GENERAL: Well developed white female in NAD PSYCH: :Pleasant,  cooperative, normal affect HEENT: Normocephalic, conjunctiva pink, mucous membranes moist, neck supple without masses CARDIAC:  RRR, no peripheral edema PULM: Normal respiratory effort, lungs CTA bilaterally, no wheezing ABDOMEN:  soft, nondistended, no obvious masses, no hepatomegaly,  normal bowel sounds. Very localized area of mild tenderness in LLQ at level of waist.  SKIN:  turgor, no lesions seen Musculoskeletal:  Normal muscle tone, normal strength NEURO: Alert and oriented x 3, no focal neurologic deficits  ASSESSMENT and PLAN:  1. 82 yo female with persistent LLQ pain She had a recent negative U/A and negative CT scan in ED. Pain may be  Musculoskeletal / abdominal wall pain?   2. Possible bowel changes. Unclear details about stool changes. Patient states her stools are loose because she takes prune juice everyday. Recent ED report stated patient complained 4 days worth of diarrhea.  Interestingly, c-diff was ordered in ED but EDP note states patient didn't have any stool to collect and patient confirm this yet there is a negative C-diff study in [facility] on same day.  -check stool for c-diff to be sure .  -Patient has now been here twice and also to ED. Following my physical exam I asked Dr. Ardis Hughs here at the office to also examine her abdomen.  Unfortunately I don't have anything else to offer Ms. Cuccaro as far as etiology or treatment of her pain.   Tye Savoy  02/14/2016, 3:09 PM

## 2016-02-15 ENCOUNTER — Other Ambulatory Visit: Payer: Medicare Other

## 2016-02-15 DIAGNOSIS — R1032 Left lower quadrant pain: Secondary | ICD-10-CM | POA: Diagnosis not present

## 2016-02-16 ENCOUNTER — Telehealth: Payer: Self-pay | Admitting: Internal Medicine

## 2016-02-16 LAB — CLOSTRIDIUM DIFFICILE BY PCR: CDIFFPCR: NOT DETECTED

## 2016-02-16 NOTE — Telephone Encounter (Signed)
Spoke to the pt and advised that [facility] is not in the office.  Advised if test was ordered from GI provider that they will give results.  Advised pt to call their office.

## 2016-02-16 NOTE — Telephone Encounter (Signed)
Pt would like to see if Dr. Regis Bill had results from the GI the GI department has not called her and she would like to have results before the weekend.

## 2016-02-16 NOTE — Progress Notes (Signed)
I agree with the above note, plan 

## 2016-02-21 ENCOUNTER — Encounter: Payer: Self-pay | Admitting: Internal Medicine

## 2016-02-21 ENCOUNTER — Ambulatory Visit (INDEPENDENT_AMBULATORY_CARE_PROVIDER_SITE_OTHER): Payer: Medicare Other | Admitting: Internal Medicine

## 2016-02-21 VITALS — BP 130/80 | Temp 98.1°F | Ht 59.25 in | Wt 164.0 lb

## 2016-02-21 DIAGNOSIS — N390 Urinary tract infection, site not specified: Secondary | ICD-10-CM

## 2016-02-21 DIAGNOSIS — R300 Dysuria: Secondary | ICD-10-CM | POA: Diagnosis not present

## 2016-02-21 DIAGNOSIS — R109 Unspecified abdominal pain: Secondary | ICD-10-CM

## 2016-02-21 LAB — POC URINALSYSI DIPSTICK (AUTOMATED)
BILIRUBIN UA: NEGATIVE
GLUCOSE UA: NEGATIVE
Ketones, UA: NEGATIVE
NITRITE UA: NEGATIVE
Protein, UA: NEGATIVE
Spec Grav, UA: 1.01
Urobilinogen, UA: 0.2
pH, UA: 6

## 2016-02-21 MED ORDER — NITROFURANTOIN MONOHYD MACRO 100 MG PO CAPS: 100.0000 mg | ORAL_CAPSULE | Freq: Two times a day (BID) | ORAL | 0 refills | Status: AC

## 2016-02-21 NOTE — Progress Notes (Signed)
Pre visit review using our clinic review tool, if applicable. No additional management support is needed unless otherwise documented below in the visit note.  Chief Complaint  Patient presents with  . Dysuria  . Back Pain  . Abdominal Pain    HPI: Ashley Lawson 82 y.o.  On going abd pain    See last 2 notes  Has seen gi felt not to be diverticulitis  Ed 11 6  And 11 15   abd has loosened up but now still tender and feels raw and infected on left side  Lower and upper.    bms smelled bad  but denies constipation.    Some mild dysuria now and inc frequency  Brought in fresh ua spec in steril cup.   Feels something is wrong  Slight better when takes her alprazolam  ROS: See pertinent positives and negatives per HPI. No fever but   Low back pain   Past Medical History:  Diagnosis Date  . Abdominal pain 12/2009   hospitalized  . Allergic rhinitis   . Diverticulosis of colon   . Dog bite(E906.0) 08/30/2011  . GERD (gastroesophageal reflux disease)   . Hepatic steatosis   . Hyperlipidemia   . Hyperplastic colon polyp   . IBS (irritable bowel syndrome)    constipation predominant  . Internal hemorrhoids   . Osteoporosis    dexa 2011 -2.7 hip nl spine  . PMR (polymyalgia rheumatica) (HCC) 08/30/2011   under rheum care  on low dose pred 5 mg   . Polymyalgia rheumatica (HCC)     Family History  Problem Relation Age of Onset  . Other Mother     fx hip  . Other Sister     fx hip  . Bladder Cancer Brother   . Colon cancer Neg Hx   . Esophageal cancer Neg Hx   . Stomach cancer Neg Hx   . Rectal cancer Neg Hx   . Liver cancer Neg Hx     Social History   Social History  . Marital status: Widowed    Spouse name: N/A  . Number of children: N/A  . Years of education: N/A   Occupational History  . retired    Social History Main Topics  . Smoking status: Never Smoker  . Smokeless tobacco: Never Used  . Alcohol use 0.0 oz/week     Comment: occ  . Drug use: No  . Sexual  activity: Not Asked   Other Topics Concern  . None   Social History Narrative   Retired   Regular exercise- yes   Widowed   At home with children and GKs    HH of 2     Puppy   Plays cards is social and active     Outpatient Medications Prior to Visit  Medication Sig Dispense Refill  . acetaminophen (TYLENOL) 500 MG tablet Take 500 mg by mouth every 6 (six) hours as needed for mild pain, moderate pain or headache.    . ALPRAZolam (XANAX) 0.25 MG tablet TAKE 1 TABLET BY MOUTH AT BEDTIME AS NEEDED 30 tablet 0  . Ascorbic Acid (VITAMIN C) 100 MG tablet Take 100 mg by mouth daily.      . calcium carbonate (OS-CAL) 1250 (500 Ca) MG chewable tablet Chew 1 tablet by mouth 2 (two) times daily.    . fish oil-omega-3 fatty acids 1000 MG capsule Take 1 g by mouth daily.     . fluticasone (FLONASE) 50 MCG/ACT nasal  spray 2 spray each nostril qd (Patient taking differently: Place 2 sprays into both nostrils daily as needed for allergies. ) 16 g 3  . loratadine (CLARITIN) 10 MG tablet Take 10 mg by mouth daily.    . MULTIPLE VITAMIN PO Take 1 tablet by mouth daily.     . Probiotic Product (ALIGN PO) Take 1 tablet by mouth daily.    . ranitidine (ZANTAC) 150 MG capsule Take 1 capsule (150 mg total) by mouth 2 (two) times daily. For reflux heartburn 60 capsule 3  . RESTASIS 0.05 % ophthalmic emulsion Place 1 drop into both eyes 2 (two) times daily.  3   No facility-administered medications prior to visit.      EXAM:  BP 130/80 (BP Location: Right Arm, Patient Position: Sitting, Cuff Size: Normal)   Temp 98.1 F (36.7 C) (Oral)   Ht 4' 11.25" (1.505 m)   Wt 164 lb (74.4 kg)   BMI 32.85 kg/m   Body mass index is 32.85 kg/m.  GENERAL: vitals reviewed and listed above, alert, oriented, appears well hydrated and in no acute distress rubbing her left lower abd  But  Non toxic and looks well  HEENT: atraumatic, conjunctiva  clear, no obvious abnormalities on inspection of external nose and  ears   NECK: no obvious masses on inspection palpation  LUNGS: clear to auscultation bilaterally, no wheezes, rales or rhonchi, good air movement CV: HRRR, no clubbing cyanosis or  peripheral edema nl cap refill  Abdomen:  Sof,t normal bowel sounds without hepatosplenomegaly, no guarding rebound or masses no CVA tenderness  Tender left mid and upper q and ti side flank area    Neg psoas sign  MS: moves all extremities without noticeable focal  Abnormality djd changes  PSYCH: pleasant and cooperative, no obvious depression mild  anxiety   Last ct abd   IMPRESSION: 1. Rectosigmoid diverticulosis without acute inflammation. 2. No acute abnormality of the abdomen or pelvis. 3. Normal appearance of the urinary bladder.   Electronically Signed   By: Ulyses Jarred M.D.   On: 02/06/2016 02:32 ASSESSMENT AND PLAN:  Discussed the following assessment and plan:  Dysuria - Plan: POCT Urinalysis Dipstick (Automated), Urine culture  Recurrent UTI  Left lateral abdominal pain Still hard to delineate etiology ball of her symptoms although her dysuria could be UTI. Trying to be cautious with antibiotics that she has been on a number recently. States that she got a headache with Flagyl. She has been on Cipro and Septra in the last month. Review of her records shows that she has had left-sided abdominal pain in the past possibly irritable bowel related treated with things such as Bentyl Metamucil and in the past Librax. Last Gi visit  " nothing to offer" for her pain .  She does probably have secondary anxiety which is augmenting her pain and we discussed this. Will follow. Fortunately no alarm symptoms today.  -Patient advised to return or notify health care team  if symptoms worsen ,persist or new concerns arise. Total visit 54mns > 50% spent counseling and coordinating care as indicated in above note and in instructions to patient .   Patient Instructions  We are doing a urine culture for  possible ute because there are some white blood cells and a little bit of blood in your urine. We have to be careful with to many antibiotics that could give you diarrhea.  We will rx this time with macrobid  you were given  Septra  Last time   Some of you pain acts like irritable bowel that may respond top a med like  Bentyl  Lets treat the infection and if having continued pain but clear urine we may try this medicine.     Standley Brooking. Panosh M.D.

## 2016-02-21 NOTE — Patient Instructions (Addendum)
We are doing a urine culture for possible ute because there are some white blood cells and a little bit of blood in your urine. We have to be careful with to many antibiotics that could give you diarrhea.  We will rx this time with macrobid  you were given  Septra  Last time   Some of you pain acts like irritable bowel that may respond top a med like  Bentyl  Lets treat the infection and if having continued pain but clear urine we may try this medicine.

## 2016-02-23 LAB — URINE CULTURE

## 2016-02-25 NOTE — Progress Notes (Signed)
Tell pt urine culture showed multiple bacteria but not predominant germ. Cannot tell if she has  UTI.

## 2016-02-26 ENCOUNTER — Telehealth: Payer: Self-pay | Admitting: Internal Medicine

## 2016-02-26 ENCOUNTER — Telehealth: Payer: Self-pay | Admitting: Nurse Practitioner

## 2016-02-26 NOTE — Telephone Encounter (Signed)
Noted  

## 2016-02-26 NOTE — Telephone Encounter (Signed)
Patient Name: Ashley Lawson DOB: [birth year] Initial Comment Caller states c/o constipation, requests Rx for laxative. She is currently on abx also. Nurse Assessment Nurse: Ronnald Ramp, RN, Miranda Date/Time (Eastern Time): 02/26/2016 10:52:22 AM Confirm and document reason for call. If symptomatic, describe symptoms. You must click the next button to save text entered. ---Caller states she feels like she might not have been emptying all parts of her bowels at one time. She has had a BM and feels better now. She wants to know if she should be taking a laxative or stool softener all the time. Does the patient have any new or worsening symptoms? ---No Please document clinical information provided and list any resource used. ---Pt has a GI doctor and recommended she follow up with that office for specific directives on chronic condition. Guidelines Guideline Title Affirmed Question Affirmed Notes Final Disposition User Clinical Call Ronnald Ramp, RN, [employer]

## 2016-02-26 NOTE — Telephone Encounter (Signed)
Pt wanted to let us know that because she is unable to have BM's it is putting pressure on her bladder states she is in a lot of pain and wants to know if it could help if she took an enema.

## 2016-02-26 NOTE — Telephone Encounter (Signed)
Patient is complaining of difficulty passing stool. She feels like her hemorrhoids are causing difficulty. She is not in pain. No rectal bleeding. Would she be a candidate for banding? She is drinking prune juice daily. She wants to take daily stool softener. We have discussed these things.

## 2016-02-27 NOTE — Telephone Encounter (Signed)
Instructed the patient. She will continue her prune juice and add Citrucel.

## 2016-02-27 NOTE — Telephone Encounter (Signed)
Hemorrhoids don't cause constipation.  It's usually the other way around; constipation and straining to move your bowels  Can cause hemorrhoids.  Please recommend that she start taking daily citrucel (orange flavored) powder fiber supplement.  This may cause some bloating at first but that usually goes away. Begin with a small spoonful and work your way up to a large, heaping spoonful daily over a week.  She should call in 3-4 weeks to report on her response.

## 2016-02-29 ENCOUNTER — Telehealth: Payer: Self-pay | Admitting: Internal Medicine

## 2016-02-29 NOTE — Telephone Encounter (Signed)
FYI, appt tomorrow with Dr.Panosh.

## 2016-02-29 NOTE — Telephone Encounter (Signed)
Patient Name: KALONI BISAILLON DOB: July 14, 1932 Initial Comment Caller states mother had bladder infection 10/14, she is still c/o bladder issues, daughter thinks she is having depression Nurse Assessment Nurse: Ronnald Ramp, RN, Miranda Date/Time (Eastern Time): 02/29/2016 9:56:37 AM Confirm and document reason for call. If symptomatic, describe symptoms. ---Caller states she is concerned her mother is depressed. She is c/o pain all time, not wanting to get out of bed or participate in normal activities. Does the patient have any new or worsening symptoms? ---Yes Will a triage be completed? ---Yes Related visit to physician within the last 2 weeks? ---No Does the PT have any chronic conditions? (i.e. diabetes, asthma, etc.) ---Yes List chronic conditions. ---Allergies, Chronic abdominal pain Is this a behavioral health or substance abuse call? ---Yes Are you having any thoughts or feelings of harming or killing yourself or someone else? ---No Are you currently experiencing any physical discomfort that you think may be related to the use of alcohol or other drugs? (use substance abuse or alcohol abuse guidelines. These include withdrawal symptoms) ---No Do you worry that you may be hearing or seeing things that others do not? ---No Do you take medications for your condition(s)? ---Yes List medications here. ---Berenice Primas Guidelines Guideline Title Affirmed Question Affirmed Notes Depression [1] Depression AND [2] worsening (e.g.,sleeping poorly, less able to do activities of daily living) Final Disposition User See Physician within 24 Hours Ronnald Ramp, RN, Miranda Comments Appt scheduled with Dr. Regis Bill for tomorrow 12/1 at [place] [facility] Disagree/Comply: Comply

## 2016-02-29 NOTE — Progress Notes (Signed)
Chief Complaint  Patient presents with  . Melena    Pt continues to have left side pain.  Has now started having black stool.  Gastro told her to come back PRN.  Stated that she has trouble starting her urinary stream.  Has also started havign urinary frequency.  . Urinary Frequency  . Urinary Retention    HPI: Ashley Lawson 82 y.o.  sda appt   Make but daughter cause concer about depressionBecause she's not eating much and still concerned about this pain. GI told her they were going to possibly do a band treatment for hemorrhoid issues but instead decided to do stool softener and Citrucel that they just started yesterday. Says not feeling well  States this week her stookls were black dark  She has had 5 cultures done in the last number of weeks. But the initial episode was an infection but not treated here. She is in distress because of her continued left lower quadrant pain. Thinks it is gas or something in there. Feels like pressure. Is having a bowel movement every day denies it is constipation. Has a past history of urinary symptoms and is seen the urologist in the past and was told that she should have daily bowel movements to avoid a bladder problem. There certain food she doesn't eat because it "comes back up" which she describes as really reflux into her throat such as apples and some other fruits. She is taking ranitidine twice a day and is still happens. There is no vomiting. Remote history of hysterectomy for benign reasons. Sometimes she feels like her bladder is pushing on her rectum. On align .  Last colon  A year ago  ROS: See pertinent positives and negatives per HPI. No fever   Diarrhea   Past Medical History:  Diagnosis Date  . Abdominal pain 12/2009   hospitalized  . Allergic rhinitis   . Diverticulosis of colon   . Dog bite(E906.0) 08/30/2011  . GERD (gastroesophageal reflux disease)   . Hepatic steatosis   . Hyperlipidemia   . Hyperplastic colon polyp   . IBS  (irritable bowel syndrome)    constipation predominant  . Internal hemorrhoids   . Osteoporosis    dexa 2011 -2.7 hip nl spine  . PMR (polymyalgia rheumatica) (HCC) 08/30/2011   under rheum care  on low dose pred 5 mg   . Polymyalgia rheumatica (HCC)     Family History  Problem Relation Age of Onset  . Other Mother     fx hip  . Other Sister     fx hip  . Bladder Cancer Brother   . Colon cancer Neg Hx   . Esophageal cancer Neg Hx   . Stomach cancer Neg Hx   . Rectal cancer Neg Hx   . Liver cancer Neg Hx     Social History   Social History  . Marital status: Widowed    Spouse name: N/A  . Number of children: N/A  . Years of education: N/A   Occupational History  . retired    Social History Main Topics  . Smoking status: Never Smoker  . Smokeless tobacco: Never Used  . Alcohol use 0.0 oz/week     Comment: occ  . Drug use: No  . Sexual activity: Not Asked   Other Topics Concern  . None   Social History Narrative   Retired   Regular exercise- yes   Widowed   At home with children and GKs  HH of 2     Puppy   Plays cards is social and active     Outpatient Medications Prior to Visit  Medication Sig Dispense Refill  . acetaminophen (TYLENOL) 500 MG tablet Take 500 mg by mouth every 6 (six) hours as needed for mild pain, moderate pain or headache.    . Ascorbic Acid (VITAMIN C) 100 MG tablet Take 100 mg by mouth daily.      . calcium carbonate (OS-CAL) 1250 (500 Ca) MG chewable tablet Chew 1 tablet by mouth 2 (two) times daily.    . fish oil-omega-3 fatty acids 1000 MG capsule Take 1 g by mouth daily.     . fluticasone (FLONASE) 50 MCG/ACT nasal spray 2 spray each nostril qd (Patient taking differently: Place 2 sprays into both nostrils daily as needed for allergies. ) 16 g 3  . loratadine (CLARITIN) 10 MG tablet Take 10 mg by mouth daily.    . MULTIPLE VITAMIN PO Take 1 tablet by mouth daily.     . Probiotic Product (ALIGN PO) Take 1 tablet by mouth daily.     . ranitidine (ZANTAC) 150 MG capsule Take 1 capsule (150 mg total) by mouth 2 (two) times daily. For reflux heartburn 60 capsule 3  . RESTASIS 0.05 % ophthalmic emulsion Place 1 drop into both eyes 2 (two) times daily.  3  . ALPRAZolam (XANAX) 0.25 MG tablet TAKE 1 TABLET BY MOUTH AT BEDTIME AS NEEDED (Patient not taking: Reported on 03/01/2016) 30 tablet 0   No facility-administered medications prior to visit.      EXAM:  BP 136/80 (BP Location: Right Arm, Patient Position: Sitting, Cuff Size: Normal)   Temp 98.2 F (36.8 C) (Oral)   Ht 4' 11.25" (1.505 m)   Wt 163 lb 9.6 oz (74.2 kg)   BMI 32.76 kg/m   Body mass index is 32.76 kg/m.  GENERAL: vitals reviewed and listed above, alert, oriented, appears well hydrated and in no acute distress  hholds her left lower side   HEENT: atraumatic, conjunctiva  clear, no obvious abnormalities on inspection of external nose and ears  NECK: no obvious masses on inspection palpation  LUNGS: clear to auscultation bilaterally, no wheezes, rales or rhonchi, good air movement CV: HRRR, no clubbing cyanosis or  peripheral edema nl cap refill  MS: moves all extremities without noticeable focal  abnormality PSYCH: pleasant and cooperative, no obvious depression or anxiety abd  Soft local llq pain no rebound   No mass  I dont feel a hernia there on standing  PHQ-SADS Somatic:7  Gas gi GAD7: 5 no panic PHQ9:1  Difficulty : not difficult    ASSESSMENT AND PLAN:  Discussed the following assessment and plan:  LLQ abdominal pain  Lower urinary tract symptoms (LUTS)  History of UTI - Plan: POCT Urinalysis Dipstick (Automated)  Gastroesophageal reflux disease, esophagitis presence not specified  Abnormal urinalysis Ashley Lawson has persistent left lower quadrant pain that seems to be localized to great concern to her. She forces to have a bowel movement every day to avoid symptoms. Uncertain meaning of dark black stools recently. She's been seen  by gastroenterology and apparently urology said everything was fine although was placed on high-fiber just recently for her pain. I do not have the urology evaluation but I'm not sure they evaluated her for pain and blood in her urine. Some of her symptoms sound like pelvic dysfunction motility possibly. Her initial episode of UTI seem to be real and she  was empirically treated for diverticulitis before the follow up.  scan was done.  Stool cards given  I don think that depression anxiety is the cause of the pain but is anxious also .    See instructinos  Also stool hem given  Because of hx of stool color changes  -Patient advised to return or notify health care team  if symptoms worsen ,persist or new concerns arise. Total visit 26mns > 50% spent counseling and coordinating care as indicated in above note and in instructions to patient .     Patient Instructions   It is good that your  Scans were  Normal   And urology check .  I need to review  the note from the urology office  . Please have them send a  Copy of the note from the recent evaluation .   urine today is better but still has  A chemical test positive for blood.  Agree with increasing the fiber  As Gi planned  . May help  If the pain is from IBS .  Sound like some of the problem could be   A motility problem   I will reveiwed record    We may Consider getting  an pelvic ultrasound  Or getting a gyne to see you.   Wt Readings from Last 3 Encounters:  03/01/16 163 lb 9.6 oz (74.2 kg)  02/21/16 164 lb (74.4 kg)  02/14/16 167 lb 2 oz (75.8 kg)   You will be contacted about  Appt  I decide .  Can try taking nexium or prilosec otc  Instead of the ranitidine   For the acid reflux .   Plan ROV fu depending on evaluation .     Or    4-6 weeks     WStandley Brooking Ashley Lawson M.D.  reviewed  Old lab     When went to ed    Had hyponatremia and hypokalemia   Minor liver abn   Not  Repeated   Cbc was normal

## 2016-03-01 ENCOUNTER — Ambulatory Visit (INDEPENDENT_AMBULATORY_CARE_PROVIDER_SITE_OTHER): Payer: Medicare Other | Admitting: Internal Medicine

## 2016-03-01 ENCOUNTER — Encounter: Payer: Self-pay | Admitting: Internal Medicine

## 2016-03-01 VITALS — BP 136/80 | Temp 98.2°F | Ht 59.25 in | Wt 163.6 lb

## 2016-03-01 DIAGNOSIS — K219 Gastro-esophageal reflux disease without esophagitis: Secondary | ICD-10-CM

## 2016-03-01 DIAGNOSIS — R1032 Left lower quadrant pain: Secondary | ICD-10-CM

## 2016-03-01 DIAGNOSIS — R399 Unspecified symptoms and signs involving the genitourinary system: Secondary | ICD-10-CM | POA: Diagnosis not present

## 2016-03-01 DIAGNOSIS — R8290 Unspecified abnormal findings in urine: Secondary | ICD-10-CM

## 2016-03-01 DIAGNOSIS — Z87440 Personal history of urinary (tract) infections: Secondary | ICD-10-CM | POA: Diagnosis not present

## 2016-03-01 LAB — POC URINALSYSI DIPSTICK (AUTOMATED)
Bilirubin, UA: NEGATIVE
Glucose, UA: NEGATIVE
Ketones, UA: NEGATIVE
NITRITE UA: NEGATIVE
PH UA: 6
PROTEIN UA: NEGATIVE
Spec Grav, UA: 1.01
UROBILINOGEN UA: 0.2

## 2016-03-01 NOTE — Patient Instructions (Addendum)
It is good that your  Scans were  Normal   And urology check .  I need to review  the note from the urology office  . Please have them send a  Copy of the note from the recent evaluation .   urine today is better but still has  A chemical test positive for blood.  Agree with increasing the fiber  As Gi planned  . May help  If the pain is from IBS .  Sound like some of the problem could be   A motility problem   I will reveiwed record    We may Consider getting  an pelvic ultrasound  Or getting a gyne to see you.   Wt Readings from Last 3 Encounters:  03/01/16 163 lb 9.6 oz (74.2 kg)  02/21/16 164 lb (74.4 kg)  02/14/16 167 lb 2 oz (75.8 kg)   You will be contacted about  Appt  I decide .  Can try taking nexium or prilosec otc  Instead of the ranitidine   For the acid reflux .   Plan ROV fu depending on evaluation .     Or    4-6 weeks

## 2016-03-11 ENCOUNTER — Telehealth: Payer: Self-pay | Admitting: Nurse Practitioner

## 2016-03-11 ENCOUNTER — Other Ambulatory Visit: Payer: Self-pay

## 2016-03-11 MED ORDER — RANITIDINE HCL 150 MG PO TABS: 150.0000 mg | ORAL_TABLET | Freq: Two times a day (BID) | ORAL | 2 refills | Status: DC

## 2016-03-11 NOTE — Telephone Encounter (Signed)
No voicemail. I have changed the prescription and called to the pharmacy to make them aware of the change.

## 2016-03-21 ENCOUNTER — Telehealth: Payer: Self-pay | Admitting: Internal Medicine

## 2016-03-21 DIAGNOSIS — M7061 Trochanteric bursitis, right hip: Secondary | ICD-10-CM | POA: Diagnosis not present

## 2016-03-21 DIAGNOSIS — M545 Low back pain: Secondary | ICD-10-CM | POA: Diagnosis not present

## 2016-03-21 NOTE — Telephone Encounter (Signed)
Pt would like know if she can drop off a urine specimen?

## 2016-03-22 NOTE — Telephone Encounter (Signed)
Just back in office after being out 11 days  Need more information Why needing urine test   Done?

## 2016-03-22 NOTE — Telephone Encounter (Signed)
Left voicemail for patient to call the office back.

## 2016-03-22 NOTE — Telephone Encounter (Signed)
Okay for patient to drop off specimen?

## 2016-03-24 DIAGNOSIS — N390 Urinary tract infection, site not specified: Secondary | ICD-10-CM | POA: Diagnosis not present

## 2016-03-24 DIAGNOSIS — R319 Hematuria, unspecified: Secondary | ICD-10-CM | POA: Diagnosis not present

## 2016-03-24 DIAGNOSIS — R350 Frequency of micturition: Secondary | ICD-10-CM | POA: Diagnosis not present

## 2016-03-26 NOTE — Telephone Encounter (Signed)
Pt seen at [facility] on 03/24/16 and diagnosed with a UTI.  Given sulfamethoxazole-trimethoprim 800-160 mg to take bid until complete.

## 2016-03-27 ENCOUNTER — Encounter: Payer: Self-pay | Admitting: Internal Medicine

## 2016-03-27 ENCOUNTER — Ambulatory Visit (INDEPENDENT_AMBULATORY_CARE_PROVIDER_SITE_OTHER): Payer: Medicare Other | Admitting: Internal Medicine

## 2016-03-27 VITALS — BP 166/74 | Temp 97.9°F | Ht 59.25 in | Wt 163.0 lb

## 2016-03-27 DIAGNOSIS — R102 Pelvic and perineal pain: Secondary | ICD-10-CM

## 2016-03-27 DIAGNOSIS — Z87440 Personal history of urinary (tract) infections: Secondary | ICD-10-CM

## 2016-03-27 DIAGNOSIS — J019 Acute sinusitis, unspecified: Secondary | ICD-10-CM

## 2016-03-27 LAB — POC URINALSYSI DIPSTICK (AUTOMATED)
BILIRUBIN UA: NEGATIVE
GLUCOSE UA: NEGATIVE
KETONES UA: NEGATIVE
Leukocytes, UA: NEGATIVE
Nitrite, UA: NEGATIVE
Protein, UA: NEGATIVE
SPEC GRAV UA: 1.015
Urobilinogen, UA: 0.2
pH, UA: 6

## 2016-03-27 MED ORDER — DOXYCYCLINE HYCLATE 100 MG PO TABS: 100.0000 mg | ORAL_TABLET | Freq: Two times a day (BID) | ORAL | 0 refills | Status: DC

## 2016-03-27 NOTE — Progress Notes (Signed)
Pre visit review using our clinic review tool, if applicable. No additional management support is needed unless otherwise documented below in the visit note.  Chief Complaint  Patient presents with  . Urinary Frequency  . Abdominal Discomfort    HPI: Ashley Lawson 82 y.o.  comes in for an acute follow-up. Had increasing dysuria and suprapubic pain 1224 and went to the urgent care. She would diagnosed with UTI and had 3+ leukocytes +2 blood nitrites negative. Apparently culture not done she was placed on Septra DS 1 by mouth twice a day 3 days. She was told to follow-up with her PCP. She is some improved but she still has lower abdominal pain she calls bladder pain. No fever. Does have her upper respiratory congestion and right-sided face pain that is not responding to her Flonase twice a day until she might also have a sinus infection ROS: See pertinent positives and negatives per HPI. No vomiting diarrhea is on a stool softener and seems to be helping her constipation.  Past Medical History:  Diagnosis Date  . Abdominal pain 12/2009   hospitalized  . Allergic rhinitis   . Diverticulosis of colon   . Dog bite(E906.0) 08/30/2011  . GERD (gastroesophageal reflux disease)   . Hepatic steatosis   . Hyperlipidemia   . Hyperplastic colon polyp   . IBS (irritable bowel syndrome)    constipation predominant  . Internal hemorrhoids   . Osteoporosis    dexa 2011 -2.7 hip nl spine  . PMR (polymyalgia rheumatica) (HCC) 08/30/2011   under rheum care  on low dose pred 5 mg   . Polymyalgia rheumatica (HCC)     Family History  Problem Relation Age of Onset  . Other Mother     fx hip  . Other Sister     fx hip  . Bladder Cancer Brother   . Colon cancer Neg Hx   . Esophageal cancer Neg Hx   . Stomach cancer Neg Hx   . Rectal cancer Neg Hx   . Liver cancer Neg Hx     Social History   Social History  . Marital status: Widowed    Spouse name: N/A  . Number of children: N/A  . Years  of education: N/A   Occupational History  . retired    Social History Main Topics  . Smoking status: Never Smoker  . Smokeless tobacco: Never Used  . Alcohol use 0.0 oz/week     Comment: occ  . Drug use: No  . Sexual activity: Not Asked   Other Topics Concern  . None   Social History Narrative   Retired   Regular exercise- yes   Widowed   At home with children and GKs    HH of 2     Puppy   Plays cards is social and active     Outpatient Medications Prior to Visit  Medication Sig Dispense Refill  . acetaminophen (TYLENOL) 500 MG tablet Take 500 mg by mouth every 6 (six) hours as needed for mild pain, moderate pain or headache.    . ALPRAZolam (XANAX) 0.25 MG tablet TAKE 1 TABLET BY MOUTH AT BEDTIME AS NEEDED 30 tablet 0  . Ascorbic Acid (VITAMIN C) 100 MG tablet Take 100 mg by mouth daily.      . calcium carbonate (OS-CAL) 1250 (500 Ca) MG chewable tablet Chew 1 tablet by mouth 2 (two) times daily.    . fish oil-omega-3 fatty acids 1000 MG capsule Take 1 g  by mouth daily.     . fluticasone (FLONASE) 50 MCG/ACT nasal spray 2 spray each nostril qd (Patient taking differently: Place 2 sprays into both nostrils daily as needed for allergies. ) 16 g 3  . loratadine (CLARITIN) 10 MG tablet Take 10 mg by mouth daily.    . MULTIPLE VITAMIN PO Take 1 tablet by mouth daily.     . Probiotic Product (ALIGN PO) Take 1 tablet by mouth daily.    . ranitidine (ZANTAC) 150 MG tablet Take 1 tablet (150 mg total) by mouth 2 (two) times daily. 60 tablet 2  . RESTASIS 0.05 % ophthalmic emulsion Place 1 drop into both eyes 2 (two) times daily.  3   No facility-administered medications prior to visit.      EXAM:  BP (!) 166/74 (BP Location: Right Arm, Patient Position: Sitting, Cuff Size: Normal)   Temp 97.9 F (36.6 C) (Oral)   Ht 4' 11.25" (1.505 m)   Wt 163 lb (73.9 kg)   BMI 32.64 kg/m   Body mass index is 32.64 kg/m.  GENERAL: vitals reviewed and listed above, alert, oriented,  appears well hydrated and in no acute distressCongested mild right cheek temporal area pain with no swelling. HEENT: atraumatic, conjunctiva  clear, no obvious abnormalities on inspection of external nose and ears OP : no lesion edema or exudate  NECK: no obvious masses on inspection palpation  Holding suprapubic area as area of discomfort. CV: HRRR, no clubbing cyanosis or  peripheral edema nl cap refill normal gait. MS: moves all extremities without noticeable focal  abnormality DJD changes. PSYCH: pleasant and cooperative, no obvious depression or anxiety CVS many clinic note reviewed. Urinalysis today 2+ blood negative leukocytes. ASSESSMENT AND PLAN:  Discussed the following assessment and plan:  Suprapubic pain  History of UTI - recurrent  but also bladder pain noted  - Plan: POCT Urinalysis Dipstick (Automated), Urine culture  Acute sinusitis with symptoms greater than 10 days - right max  continue measurescan add antibiotic  Apparently treated UTI with residual symptoms including pain and discomfort and chemical test for blood remains in the urine. Possible right sided sinusitis. She is allergic to penicillin will give doxycycline 1 by mouth twice a day for 7 days. We have recultured  urine test for today call urology without reevaluation for your bladder pain concern for times of possible retention based on your history. -Patient advised to return or notify health care team  if symptoms worsen ,persist or new concerns arise. We will also try to contact uro for appt can add azo if needed for bladder pain ./  Patient Instructions  Your urinalysis results have improved some but there still is some small amount of blood in the urine. In regard to your sinusitis if Afrin doesn't work for the right face pain we can add an antibiotic for sinusitis. If your abdominal or urine symptoms are not getting better I want the urologist to see you again.     Standley Brooking. Khayman Kirsch M.D.

## 2016-03-27 NOTE — Patient Instructions (Addendum)
Your urinalysis results have improved some but there still is some small amount of blood in the urine. In regard to your sinusitis if Afrin doesn't work for the right face pain we can add an antibiotic for sinusitis. If your abdominal or urine symptoms are not getting better I want the urologist to see you again.

## 2016-03-28 ENCOUNTER — Telehealth: Payer: Self-pay | Admitting: Family Medicine

## 2016-03-28 DIAGNOSIS — R3982 Chronic bladder pain: Secondary | ICD-10-CM | POA: Diagnosis not present

## 2016-03-28 DIAGNOSIS — R3129 Other microscopic hematuria: Secondary | ICD-10-CM | POA: Diagnosis not present

## 2016-03-28 LAB — URINE CULTURE: Organism ID, Bacteria: NO GROWTH

## 2016-03-28 NOTE — Telephone Encounter (Signed)
-----   Message from Burnis Medin, MD sent at 03/27/2016  5:29 PM EST ----- Regarding: needs to get appt with urology Please call tomorrow and help patient get an appointment ASAP  with her urology group which is [facility]. She is having recurrent urinary bladder symptoms and has blood in her urine by dipstick. Recent treatment for UTI. Needs evaluated for bladder pain despite treatment for UTI and blood remaining in the urine. I told patient to call also. Thanks [facility]

## 2016-03-28 NOTE — Telephone Encounter (Signed)
Left a message for a return call.

## 2016-03-29 NOTE — Telephone Encounter (Signed)
Spoke to the pt.  She was seen at urology on 03/28/16.

## 2016-04-02 ENCOUNTER — Telehealth: Payer: Self-pay | Admitting: Internal Medicine

## 2016-04-02 NOTE — Telephone Encounter (Signed)
Pt states she was having pain in her lower abdomen and was sent to urology for possible UTI. Pt states she did not have UTI. States she is having some lower abdominal pain on her left side and if it does not get better she will call back for an appt. Pt also wanted to know what worked better, zantac capsule or pill. Discussed with her that they were both the same pill and should work the same.

## 2016-04-02 NOTE — Telephone Encounter (Signed)
pts daughter would like to see if you or Dr. Regis Bill can give her mother a referral to a geriatric primary care doctor daughter would like to try to something different for the new year you call the home number after 5pm otherwise the other number given.

## 2016-04-02 NOTE — Telephone Encounter (Signed)
There are a few geriatricians in town  [place] and [facility] sre a few  Dr Nyoka [facility] . [facility] comprehensive [facility] support [facility]   Misty .Marland KitchenPlease obtain the   Note from the urologist so we can follow up on  Her   Urinary and abd pain  Issues  I was wondering about  A dx   Incomplete bladder emptying  Function ( poss relate to constipation) and also could she have IC?

## 2016-04-03 NOTE — Telephone Encounter (Signed)
Spoke to the pt.  Informed her of names for gerontologist.  She will call urologist office and have office note faxed as that office will need her authorization.

## 2016-04-04 ENCOUNTER — Emergency Department (HOSPITAL_COMMUNITY): Payer: Medicare Other

## 2016-04-04 ENCOUNTER — Emergency Department (HOSPITAL_COMMUNITY)
Admission: EM | Admit: 2016-04-04 | Discharge: 2016-04-04 | Disposition: A | Discharge status: Home / Self Care | Payer: Medicare Other | Attending: Emergency Medicine | Admitting: Emergency Medicine

## 2016-04-04 ENCOUNTER — Encounter (HOSPITAL_COMMUNITY): Payer: Self-pay

## 2016-04-04 ENCOUNTER — Telehealth: Payer: Self-pay | Admitting: Internal Medicine

## 2016-04-04 DIAGNOSIS — R1032 Left lower quadrant pain: Secondary | ICD-10-CM | POA: Insufficient documentation

## 2016-04-04 DIAGNOSIS — Z79899 Other long term (current) drug therapy: Secondary | ICD-10-CM | POA: Diagnosis not present

## 2016-04-04 DIAGNOSIS — K5730 Diverticulosis of large intestine without perforation or abscess without bleeding: Secondary | ICD-10-CM | POA: Diagnosis not present

## 2016-04-04 LAB — COMPREHENSIVE METABOLIC PANEL
ALBUMIN: 4.3 g/dL (ref 3.5–5.0)
ALK PHOS: 69 U/L (ref 38–126)
ALT: 25 U/L (ref 14–54)
ANION GAP: 11 (ref 5–15)
AST: 28 U/L (ref 15–41)
BUN: 6 mg/dL (ref 6–20)
CALCIUM: 9.5 mg/dL (ref 8.9–10.3)
CO2: 23 mmol/L (ref 22–32)
Chloride: 94 mmol/L — ABNORMAL LOW (ref 101–111)
Creatinine, Ser: 0.74 mg/dL (ref 0.44–1.00)
GFR calc Af Amer: >60 mL/min (ref >60)
GFR calc non Af Amer: >60 mL/min (ref >60)
GLUCOSE: 96 mg/dL (ref 65–99)
Potassium: 3.5 mmol/L (ref 3.5–5.1)
SODIUM: 128 mmol/L — AB (ref 135–145)
Total Bilirubin: 0.9 mg/dL (ref 0.3–1.2)
Total Protein: 7 g/dL (ref 6.5–8.1)

## 2016-04-04 LAB — URINALYSIS, ROUTINE W REFLEX MICROSCOPIC
BACTERIA UA: NONE SEEN
BILIRUBIN URINE: NEGATIVE
Glucose, UA: NEGATIVE mg/dL
KETONES UR: NEGATIVE mg/dL
LEUKOCYTES UA: NEGATIVE
NITRITE: NEGATIVE
Protein, ur: NEGATIVE mg/dL
Specific Gravity, Urine: 1.001 — ABNORMAL LOW (ref 1.005–1.030)
pH: 9 — ABNORMAL HIGH (ref 5.0–8.0)

## 2016-04-04 LAB — CBC WITH DIFFERENTIAL/PLATELET
Basophils Absolute: 0 10*3/uL (ref 0.0–0.1)
Basophils Relative: 0 %
EOS ABS: 0.1 10*3/uL (ref 0.0–0.7)
Eosinophils Relative: 1 %
HCT: 39.7 % (ref 36.0–46.0)
HEMOGLOBIN: 14.3 g/dL (ref 12.0–15.0)
Lymphocytes Relative: 33 %
Lymphs Abs: 2.2 10*3/uL (ref 0.7–4.0)
MCH: 31.5 pg (ref 26.0–34.0)
MCHC: 36 g/dL (ref 30.0–36.0)
MCV: 87.4 fL (ref 78.0–100.0)
Monocytes Absolute: 0.6 10*3/uL (ref 0.1–1.0)
Monocytes Relative: 9 %
NEUTROS PCT: 57 %
Neutro Abs: 3.9 10*3/uL (ref 1.7–7.7)
Platelets: 254 10*3/uL (ref 150–400)
RBC: 4.54 MIL/uL (ref 3.87–5.11)
RDW: 12.4 % (ref 11.5–15.5)
WBC: 6.8 10*3/uL (ref 4.0–10.5)

## 2016-04-04 LAB — I-STAT CG4 LACTIC ACID, ED
Lactic Acid, Venous: 1.96 mmol/L (ref 0.5–1.9)
Lactic Acid, Venous: 0.75 mmol/L (ref 0.5–1.9)

## 2016-04-04 LAB — I-STAT TROPONIN, ED: Troponin i, poc: 0 ng/mL (ref 0.00–0.08)

## 2016-04-04 MED ORDER — IOPAMIDOL (ISOVUE-300) INJECTION 61%
100.0000 mL | Freq: Once | INTRAVENOUS | Status: AC | PRN
  Administered 2016-04-04: 100 mL via INTRAVENOUS

## 2016-04-04 MED ORDER — METHOCARBAMOL 500 MG PO TABS: ORAL_TABLET | ORAL | 0 refills | Status: DC

## 2016-04-04 MED ORDER — MORPHINE SULFATE (PF) 4 MG/ML IV SOLN
4.0000 mg | Freq: Once | INTRAVENOUS | Status: AC
Start: 2016-04-04 — End: 2016-04-04
  Administered 2016-04-04: 4 mg via INTRAVENOUS
  Filled 2016-04-04: qty 1

## 2016-04-04 MED ORDER — LORATADINE 10 MG PO CAPS: 1.0000 | ORAL_CAPSULE | Freq: Every day | ORAL | 2 refills | Status: DC

## 2016-04-04 MED ORDER — SODIUM CHLORIDE 0.9 % IV BOLUS (SEPSIS)
500.0000 mL | Freq: Once | INTRAVENOUS | Status: AC
  Administered 2016-04-04: 500 mL via INTRAVENOUS

## 2016-04-04 MED ORDER — IOPAMIDOL (ISOVUE-300) INJECTION 61%
INTRAVENOUS | Status: AC
  Filled 2016-04-04: qty 100

## 2016-04-04 MED ORDER — DICLOFENAC SODIUM 1 % TD GEL: 4.0000 g | Freq: Four times a day (QID) | TRANSDERMAL | 0 refills | Status: DC

## 2016-04-04 MED ORDER — ONDANSETRON HCL 4 MG/2ML IJ SOLN
4.0000 mg | Freq: Once | INTRAMUSCULAR | Status: AC
  Administered 2016-04-04: 4 mg via INTRAVENOUS
  Filled 2016-04-04: qty 2

## 2016-04-04 MED ORDER — LIDOCAINE HCL 2 % EX GEL
1.0000 "application " | Freq: Once | CUTANEOUS | Status: AC
  Administered 2016-04-04: 1
  Filled 2016-04-04: qty 11

## 2016-04-04 NOTE — Telephone Encounter (Signed)
° ° ° °  Daughter said she had pt at the ER today for left side pain. Daughter said ER told her to contact pt pcp and have them order a MRI. She call today to ask if that MRI can be scheduled would like a call back    [phone]

## 2016-04-04 NOTE — ED Provider Notes (Signed)
[facility] DEPT Provider Note   CSN: [id number] Arrival date & time: 04/04/16  5176     History   Chief Complaint Chief Complaint  Patient presents with  . Abdominal Pain     HPI  Blood pressure 133/62, pulse 72, temperature 97.6 F (36.4 C), temperature source Oral, resp. rate 16, SpO2 100 %.  Ashley Lawson is a 83 y.o. female complaining of urinary frequency and hsty of recent /chronic bladder infections and chronic constipation with hemorrhoids. "Feels like something is pressing on her bladder" since October, with LLQ tenderness 9/10 pain upon palpation w/o guarding. Last BM was yesterday and pt states it was Darker than normal but not all the time. Pts has been treated with multiple courses of abx for UTIs.  CT done in Nov 2017 Colonoscopy June 2016 showed three polyps which where removed, pts states a more recent colonoscopy completed 1 year ago showed no polyps. Denies fever, chills, nausea, vomiting, diarrhea. She states that she has multiple hemorrhoids. She's not anticoagulated.  Past Medical History:  Diagnosis Date  . Abdominal pain 12/2009   hospitalized  . Allergic rhinitis   . Diverticulosis of colon   . Dog bite(E906.0) 08/30/2011  . GERD (gastroesophageal reflux disease)   . Hepatic steatosis   . Hyperlipidemia   . Hyperplastic colon polyp   . IBS (irritable bowel syndrome)    constipation predominant  . Internal hemorrhoids   . Osteoporosis    dexa 2011 -2.7 hip nl spine  . PMR (polymyalgia rheumatica) (HCC) 08/30/2011   under rheum care  on low dose pred 5 mg   . Polymyalgia rheumatica [facility])     Patient Active Problem List   Diagnosis Date Noted  . Rhinitis, allergic 05/31/2015  . Renal cyst incidental  01/19/2015  . Heartburn 01/14/2014  . Gastroesophageal reflux disease without esophagitis 01/14/2014  . Osteoporosis 01/14/2014  . IBS (irritable bowel syndrome)   . Nail, ingrown 06/22/2012  . Arthritis 02/19/2012  . Screening, lipid  02/18/2011  . Rash and nonspecific skin eruption 02/18/2011  . Visit for preventive health examination 02/18/2011  . Skin cancer 10/14/2010  . Abdominal bloating 10/08/2010  . CONSTIPATION, CHRONIC 01/15/2010  . Lower urinary tract symptoms (LUTS) 01/15/2010  . SUPRAPUBIC PAIN 08/01/2009  . ABDOMINAL PAIN, EPIGASTRIC 06/16/2009  . DIVERTICULOSIS, COLON 06/07/2007  . Hyperlipidemia 06/05/2007  . ANXIETY STATE, UNSPECIFIED 06/05/2007  . Allergic rhinitis 06/05/2007  . IBS 06/05/2007  . OSTEOPOROSIS 06/05/2007  . ABDOMINAL PAIN, GENERALIZED 06/05/2007    Past Surgical History:  Procedure Laterality Date  . ABDOMINAL HYSTERECTOMY    . CATARACT EXTRACTION  2010   both    OB History    No data available       Home Medications    Prior to Admission medications   Medication Sig Start Date End Date Taking? Authorizing Provider  acetaminophen (TYLENOL) 500 MG tablet Take 500 mg by mouth every 6 (six) hours as needed for mild pain, moderate pain or headache.   Yes Historical Provider, MD  ALPRAZolam Duanne Moron) 0.25 MG tablet TAKE 1 TABLET BY MOUTH AT BEDTIME AS NEEDED 01/12/16  Yes Burnis Medin, MD  Ascorbic Acid (VITAMIN C) 100 MG tablet Take 100 mg by mouth daily.     Yes Historical Provider, MD  calcium carbonate (OS-CAL) 1250 (500 Ca) MG chewable tablet Chew 1 tablet by mouth 2 (two) times daily.   Yes Historical Provider, MD  fish oil-omega-3 fatty acids 1000 MG capsule Take 1 g  by mouth daily.    Yes Historical Provider, MD  fluticasone (FLONASE) 50 MCG/ACT nasal spray 2 spray each nostril qd Patient taking differently: Place 2 sprays into both nostrils daily as needed for allergies.  06/16/12  Yes Burnis Medin, MD  MULTIPLE VITAMIN PO Take 1 tablet by mouth daily.    Yes Historical Provider, MD  Probiotic Product (ALIGN PO) Take 1 tablet by mouth daily.   Yes Historical Provider, MD  ranitidine (ZANTAC) 150 MG tablet Take 1 tablet (150 mg total) by mouth 2 (two) times daily.  03/11/16 04/10/16 Yes Willia Craze, NP  RESTASIS 0.05 % ophthalmic emulsion Place 1 drop into both eyes 2 (two) times daily. 10/26/14  Yes Historical Provider, MD  senna-docusate (SENOKOT-S) 8.6-50 MG tablet Take 1 tablet by mouth daily.   Yes Historical Provider, MD  diclofenac sodium (VOLTAREN) 1 % GEL Apply 4 g topically 4 (four) times daily. 04/04/16   Rayley Gao, PA-C  Loratadine 10 MG CAPS Take 1 capsule (10 mg total) by mouth daily. 04/04/16   Denee Boeder, PA-C  methocarbamol (ROBAXIN) 500 MG tablet Can take up to 1-2 tabs every 6 hours PRN PAIN 04/04/16   Monico Blitz, PA-C    Family History Family History  Problem Relation Age of Onset  . Other Mother     fx hip  . Other Sister     fx hip  . Bladder Cancer Brother   . Colon cancer Neg Hx   . Esophageal cancer Neg Hx   . Stomach cancer Neg Hx   . Rectal cancer Neg Hx   . Liver cancer Neg Hx     Social History Social History  Substance Use Topics  . Smoking status: Never Smoker  . Smokeless tobacco: Never Used  . Alcohol use 0.0 oz/week     Comment: occ     Allergies   Alendronate sodium; Amoxicillin; and Flagyl [metronidazole]   Review of Systems Review of Systems  10 systems reviewed and found to be negative, except as noted in the HPI.  Physical Exam Updated Vital Signs BP 133/62   Pulse 72   Temp 97.6 F (36.4 C) (Oral)   Resp 16   SpO2 100%   Physical Exam  Constitutional: She is oriented to person, place, and time. She appears well-developed and well-nourished. No distress.  HENT:  Head: Normocephalic.  Mouth/Throat: Oropharynx is clear and moist.  Eyes: Conjunctivae and EOM are normal.  Neck: Normal range of motion.  Cardiovascular: Normal rate.   Pulmonary/Chest: Effort normal. No stridor.  Abdominal: Soft. Bowel sounds are normal. She exhibits no distension and no mass. There is tenderness. There is no rebound and no guarding.  Normal active bowel sounds, tender palpation in the  left lower quadrant with no guarding or rebound.  Genitourinary:  Genitourinary Comments: No CVA tenderness to percussion bilaterally  Digital rectal exam is chaperoned by PA student Mark: Multiple external hemorrhoids, normal rectal tone, no stool in the rectal vault,  External genitalia with no discharge or lesions.  Musculoskeletal: Normal range of motion.  Neurological: She is alert and oriented to person, place, and time.  Psychiatric: She has a normal mood and affect.  Nursing note and vitals reviewed.    ED Treatments / Results  Labs (all labs ordered are listed, but only abnormal results are displayed) Labs Reviewed  URINALYSIS, ROUTINE W REFLEX MICROSCOPIC - Abnormal; Notable for the following:       Result Value   Color, Urine  STRAW (*)    Specific Gravity, Urine 1.001 (*)    pH 9.0 (*)    Hgb urine dipstick SMALL (*)    Squamous Epithelial / LPF 0-5 (*)    All other components within normal limits  COMPREHENSIVE METABOLIC PANEL - Abnormal; Notable for the following:    Sodium 128 (*)    Chloride 94 (*)    All other components within normal limits  I-STAT CG4 LACTIC ACID, ED - Abnormal; Notable for the following:    Lactic Acid, Venous 1.96 (*)    All other components within normal limits  URINE CULTURE  CBC WITH DIFFERENTIAL/PLATELET  I-STAT TROPOININ, ED  I-STAT CG4 LACTIC ACID, ED    EKG  EKG Interpretation None       Radiology Ct Abdomen Pelvis W Contrast  Result Date: 04/04/2016 CLINICAL DATA:  LEFT lower quadrant pain off and on since October, urinary frequency, recent bladder infections, abdominal pain, rectal pain, past history of hyperlipidemia, irritable bowel syndrome, colonic diverticulosis, hepatic steatosis, polymyalgia rheumatica, GERD EXAM: CT ABDOMEN AND PELVIS WITH CONTRAST TECHNIQUE: Multidetector CT imaging of the abdomen and pelvis was performed using the standard protocol following bolus administration of intravenous contrast. Sagittal  and coronal MPR images reconstructed from axial data set. CONTRAST:  170m ISOVUE-300 IOPAMIDOL (ISOVUE-300) INJECTION 61% IV. No oral contrast administered. COMPARISON:  02/06/2016 FINDINGS: Lower chest: Minimal subpleural scarring RIGHT lower lobe image 51 unchanged. Lung bases otherwise clear. Hepatobiliary: Gallbladder and liver normal appearance Pancreas: Normal appearance Spleen: Normal appearance Adrenals/Urinary Tract: Adrenal glands normal appearance. Kidneys, ureters, and bladder normal appearance. Stomach/Bowel: Normal appendix. Diverticulosis of descending and sigmoid colon without evidence of acute diverticulitis. Stomach and bowel loops otherwise normal appearance for exam lacking GI contrast and adequate distention. Vascular/Lymphatic: Aorta normal caliber with scattered atherosclerotic calcification. No adenopathy. Reproductive: Uterus surgically absent with normal appearing ovaries Other: No free air or free fluid. Tiny umbilical hernia containing fat. Musculoskeletal: Bones demineralized with scattered degenerative disc disease changes of the thoracolumbar spine. IMPRESSION: No acute intra-abdominal or intrapelvic abnormalities. Distal colonic diverticulosis. Tiny umbilical hernia containing fat. Aortic atherosclerosis. Electronically Signed   By: MLavonia DanaM.D.   On: 04/04/2016 08:42    Procedures Procedures (including critical care time)  Medications Ordered in ED Medications  iopamidol (ISOVUE-300) 61 % injection (not administered)  sodium chloride 0.9 % bolus 500 mL (0 mLs Intravenous Stopped 04/04/16 0813)  morphine 4 MG/ML injection 4 mg (4 mg Intravenous Given 04/04/16 0832)  ondansetron (ZOFRAN) injection 4 mg (4 mg Intravenous Given 04/04/16 0832)  iopamidol (ISOVUE-300) 61 % injection 100 mL (100 mLs Intravenous Contrast Given 04/04/16 0812)  lidocaine (XYLOCAINE) 2 % jelly 1 application (1 application Other Given 04/04/16 0837)     Initial Impression / Assessment and Plan / ED  Course  I have reviewed the triage vital signs and the nursing notes.  Pertinent labs & imaging results that were available during my care of the patient were reviewed by me and considered in my medical decision making (see chart for details).  Clinical Course     Vitals:   04/04/16 0540 04/04/16 0759 04/04/16 0837 04/04/16 1049  BP: 162/70 137/58 130/66 133/62  Pulse: 73 76 76 72  Resp: 16 17 17 16   Temp: 97.6 F (36.4 C)     TempSrc: Oral     SpO2: 99% 100% 97% 100%    Medications  iopamidol (ISOVUE-300) 61 % injection (not administered)  sodium chloride 0.9 % bolus 500 mL (  0 mLs Intravenous Stopped 04/04/16 0813)  morphine 4 MG/ML injection 4 mg (4 mg Intravenous Given 04/04/16 0832)  ondansetron (ZOFRAN) injection 4 mg (4 mg Intravenous Given 04/04/16 0832)  iopamidol (ISOVUE-300) 61 % injection 100 mL (100 mLs Intravenous Contrast Given 04/04/16 0812)  lidocaine (XYLOCAINE) 2 % jelly 1 application (1 application Other Given 04/04/16 0837)    Ashley Lawson is 83 y.o. female presenting with Severe left lower quadrant pain, it seems this has been waxing and waning over the course of last several months, she's had multiple prescriptions for UTIs. Patient with no suprapubic tenderness, no CVA tenderness, urinalysis is without signs of infection. Lactic acid is just above normal at 1.96, EKG and troponin with no acute abnormalities.  Blood work reassuring, CT negative. I've offered this patient today bladder antispasmodic but she declines, attending physician has evaluated the patient thinks that this may be muscular skeletal in nature. Will write a prescription for Voltaren gel and a muscle relaxer. I've counseled her extensively on fall precautions. She is to follow with GI for evaluation of her hemorrhoids with primary care for possible pelvic MRI. Patient specifically requesting loratadine gelcaps she says that that's very helpful with her gas. I'm happy to write her a prescription for  this.  Evaluation does not show pathology that would require ongoing emergent intervention or inpatient treatment. Pt is hemodynamically stable and mentating appropriately. Discussed findings and plan with patient/guardian, who agrees with care plan. All questions answered. Return precautions discussed and outpatient follow up given.      Final Clinical Impressions(s) / ED Diagnoses   Final diagnoses:  LLQ pain    New Prescriptions Discharge Medication List as of 04/04/2016 11:58 AM    START taking these medications   Details  Loratadine 10 MG CAPS Take 1 capsule (10 mg total) by mouth daily., Starting Thu 04/04/2016, Print    methocarbamol (ROBAXIN) 500 MG tablet Can take up to 1-2 tabs every 6 hours PRN PAIN, Print         Monico Blitz, PA-C 04/04/16 1410    Charlesetta Shanks, MD 04/04/16 1705

## 2016-04-04 NOTE — Discharge Instructions (Signed)
These follow with your primary care doctor and discussed possibly getting an MRI to further evaluate your left-sided pelvic pain.  Please follow with [facility] gastroenterology for discussion of hemorrhoid banding.  You can take Robaxin which is a muscle relaxer for pain control. Please be very careful with this because it will make you drowsy and not so steady on your feet, please take fall precautions and walk with a walker if you have one and try not to stay alone.

## 2016-04-04 NOTE — ED Triage Notes (Signed)
Pt complains of urinary frequency and hx of recent bladder infections, has been seen last week by her primary and treated for the infection.  She continues to complain of abdominal pain and rectal pain, last BM was yesterday and the pt states that she takes stool softeners daily

## 2016-04-05 ENCOUNTER — Other Ambulatory Visit: Payer: Self-pay | Admitting: Nurse Practitioner

## 2016-04-05 LAB — URINE CULTURE

## 2016-04-05 MED ORDER — RANITIDINE HCL 150 MG PO TABS: 150.0000 mg | ORAL_TABLET | Freq: Two times a day (BID) | ORAL | 2 refills | Status: DC

## 2016-04-05 NOTE — Telephone Encounter (Signed)
Patient has had persistent abdominal pain as waxing waning for several months. No acute etiology identified on CT. By examination patient does have an area of focal tenderness on the left far lateral abdomen between the iliac crest and the rib cage. This pain is reproducible to a depth of only about an inch or so. Consideration is given to possible musculoskeletal, abdominal wall pain. Cellulitis or abscess is identified. We'll trial muscle relaxer and suggest possible follow-up MRI as this is been chronic and ongoing without identified etiology despite extensive evaluation. Patient is however in good condition alert and appropriate and I feel stable for discharge.

## 2016-04-05 NOTE — Telephone Encounter (Signed)
OV next week( post ED)  To see what imaging can be arranged.and any further  evaluation  Requires   Me as ordering provider to do a specific  Assessment to get  appropriate imagine study .  And get approval  to proceed  With any testing   Also  waiting on   Urology notes to try to sort out cause of  Possible bladder pain .   And blood that was in urine.

## 2016-04-05 NOTE — Telephone Encounter (Signed)
rx sent Patient notified

## 2016-04-05 NOTE — Telephone Encounter (Signed)
Pt daughter cell is [phone] . Pt daughter is checking on status of MRI

## 2016-04-05 NOTE — Telephone Encounter (Signed)
Also noted that pain from ed was different ? Than pain in office?  Although in lower abdomen.

## 2016-04-05 NOTE — Telephone Encounter (Signed)
Spoke to the pt.  She will come on Tuesday for discuss MRI.  I asked her how her pain was different at the ED vs here in the office.  Pt states she could not remember what she said in the ED and that she may have said anything because she is is so much pain.  Pain is no different just continuing.

## 2016-04-08 ENCOUNTER — Encounter: Payer: Self-pay | Admitting: Family Medicine

## 2016-04-08 ENCOUNTER — Ambulatory Visit (INDEPENDENT_AMBULATORY_CARE_PROVIDER_SITE_OTHER): Payer: Medicare Other | Admitting: Family Medicine

## 2016-04-08 ENCOUNTER — Telehealth: Payer: Self-pay | Admitting: Internal Medicine

## 2016-04-08 VITALS — BP 115/73 | HR 75 | Temp 98.6°F | Ht 59.25 in | Wt 148.0 lb

## 2016-04-08 DIAGNOSIS — R1032 Left lower quadrant pain: Secondary | ICD-10-CM | POA: Diagnosis not present

## 2016-04-08 DIAGNOSIS — G8929 Other chronic pain: Secondary | ICD-10-CM

## 2016-04-08 DIAGNOSIS — R300 Dysuria: Secondary | ICD-10-CM | POA: Diagnosis not present

## 2016-04-08 LAB — POC URINALSYSI DIPSTICK (AUTOMATED)
BILIRUBIN UA: NEGATIVE
Glucose, UA: NEGATIVE
NITRITE UA: NEGATIVE
PH UA: 6
Protein, UA: NEGATIVE
Spec Grav, UA: 1.01
Urobilinogen, UA: 0.2

## 2016-04-08 MED ORDER — DICYCLOMINE HCL 20 MG PO TABS: 20.0000 mg | ORAL_TABLET | Freq: Four times a day (QID) | ORAL | 0 refills | Status: DC

## 2016-04-08 MED ORDER — DIAZEPAM 5 MG PO TABS: 5.0000 mg | ORAL_TABLET | Freq: Three times a day (TID) | ORAL | 0 refills | Status: DC | PRN

## 2016-04-08 NOTE — Telephone Encounter (Signed)
Noted  

## 2016-04-08 NOTE — Telephone Encounter (Signed)
Patient Name: Ashley Lawson  DOB: 1932/08/17    Initial Comment Caller states she has left side and bladder pain   Nurse Assessment  Nurse: Verlin Fester RN, [facility] Date/Time (Eastern Time): 04/08/2016 9:25:14 AM  Confirm and document reason for call. If symptomatic, describe symptoms. ---Patient states she has had pain since October 15th, she has seen several doctors and this morning she having pain on the left side and bladder area and she can hardly walk  Does the patient have any new or worsening symptoms? ---Yes  Will a triage be completed? ---Yes  Related visit to physician within the last 2 weeks? ---No  Does the PT have any chronic conditions? (i.e. diabetes, asthma, etc.) ---Yes  List chronic conditions. ---" bladder and rectal problems  Is this a behavioral health or substance abuse call? ---No     Guidelines    Guideline Title Affirmed Question Affirmed Notes  Pelvic Pain - Female [1] SEVERE pelvic pain AND [2] present > 1 hour    Final Disposition User   Go to ED Now Verlin Fester, RN, [facility]    Referrals  REFERRED TO PCP OFFICE   Disagree/Comply: Disagree  Disagree/Comply Reason: Disagree with instructions  Refused ED and insisted on appt today

## 2016-04-08 NOTE — Progress Notes (Signed)
   Subjective:    Patient ID: Ashley Lawson, female    DOB: [birth year], 83 y.o.   MRN: [id number]  HPI Here with her daughter for chronic LLQ abdominal pain. This started around the middle of October when she saw her PCP, Dr. Regis Bill, and was diagnosed with a UTI. She was given Cipro but the urine culture was negative. She stopped the Cipro after several days. Her pain did not improve. She saw the GI office in early November and they felt she had diverticulitis. She was given Cipro and Flagyl, but she stopped these after 2 days because she felt they made her feel worse. A CT scan of the abdomen and pelvis showed sigmoid diverticulosis but no active infection, so again no etiology for her pain was found. She has now seen her PCP several times, has seen GI several times,  Has seen her urologist, Dr. McDiarmid once, and she has been to the ER twice. However no etiology has ever been found for her pain and no intervention has helped much. She has tried Robaxin with no relief. She has had CT scans of the abdomen and pelvis twice with no acute processes seen. She has chronic constipation but she takes Citrucel and stoll softeners every day to stay regular. She averages 2 to 4 loose stools every day. No urinary symptoms. She gets nauseated at times but has not vomited. No fever. She describes the pain as spasmodic and intermittent, sharp in nature. It is centered in the LLQ but sometimes the pain spreads across the entire lower abdomen. She had a colonoscopy in June of 2016 which was clear except for a few benign polyps. She was last seen in the ER 4 days ago, and they suggested she have her PCP order an MRI of the abdomen. The pains are severe and she is at her wits end.    Review of Systems  Constitutional: Negative.   Respiratory: Negative.   Cardiovascular: Negative.   Gastrointestinal: Positive for abdominal distention, abdominal pain, constipation and nausea. Negative for anal bleeding, blood in stool,  diarrhea, rectal pain and vomiting.  Genitourinary: Negative.   Neurological: Negative.   Psychiatric/Behavioral: Negative for dysphoric mood. The patient is nervous/anxious.        Objective:   Physical Exam  Constitutional: She is oriented to person, place, and time.  In a lot of pain, very anxious, almost tearful   Neck: No thyromegaly present.  Cardiovascular: Normal rate, regular rhythm, normal heart sounds and intact distal pulses.   Pulmonary/Chest: Breath sounds normal.  Abdominal: Soft. Bowel sounds are normal. She exhibits no distension and no mass. There is no rebound and no guarding.  Very tender in the LLQ and mildly tender in the RLQ and suprapubic areas  Lymphadenopathy:    She has no cervical adenopathy.  Neurological: She is alert and oriented to person, place, and time.          Assessment & Plan:  Chronic lower abdominal pains of uncertain etiology. She does not seem to have a current infection. She may be having rectal spasms or tenesmus. We will try given her Dicyclomine 20 mg TID and Valium 5 mg TID to relax her skeletal muscles and smooth muscles. At her insistence we will set up an abdominal MRI. She will follow up with Dr. Regis Bill.  Alysia Penna, MD

## 2016-04-08 NOTE — Progress Notes (Signed)
Pre visit review using our clinic review tool, if applicable. No additional management support is needed unless otherwise documented below in the visit note. 

## 2016-04-09 ENCOUNTER — Ambulatory Visit: Payer: Medicare Other | Admitting: Internal Medicine

## 2016-04-09 ENCOUNTER — Other Ambulatory Visit: Payer: Self-pay | Admitting: Family Medicine

## 2016-04-09 DIAGNOSIS — G8929 Other chronic pain: Secondary | ICD-10-CM

## 2016-04-09 DIAGNOSIS — R1032 Left lower quadrant pain: Principal | ICD-10-CM

## 2016-04-14 ENCOUNTER — Ambulatory Visit
Admission: RE | Admit: 2016-04-14 | Discharge: 2016-04-14 | Disposition: A | Discharge status: Home / Self Care | Payer: Medicare Other | Source: Ambulatory Visit | Attending: Internal Medicine | Admitting: Internal Medicine

## 2016-04-14 DIAGNOSIS — K5730 Diverticulosis of large intestine without perforation or abscess without bleeding: Secondary | ICD-10-CM | POA: Diagnosis not present

## 2016-04-14 DIAGNOSIS — G8929 Other chronic pain: Secondary | ICD-10-CM

## 2016-04-14 DIAGNOSIS — R1032 Left lower quadrant pain: Principal | ICD-10-CM

## 2016-04-14 MED ORDER — GADOBENATE DIMEGLUMINE 529 MG/ML IV SOLN
13.0000 mL | Freq: Once | INTRAVENOUS | Status: AC | PRN
  Administered 2016-04-14: 13 mL via INTRAVENOUS

## 2016-04-18 ENCOUNTER — Ambulatory Visit: Payer: Medicare Other | Admitting: Internal Medicine

## 2016-04-19 ENCOUNTER — Encounter (HOSPITAL_COMMUNITY): Payer: Self-pay | Admitting: *Deleted

## 2016-04-19 ENCOUNTER — Ambulatory Visit: Payer: Medicare Other | Admitting: Family Medicine

## 2016-04-19 ENCOUNTER — Encounter: Payer: Self-pay | Admitting: Family Medicine

## 2016-04-19 ENCOUNTER — Ambulatory Visit: Payer: Medicare Other | Admitting: Internal Medicine

## 2016-04-19 ENCOUNTER — Ambulatory Visit (INDEPENDENT_AMBULATORY_CARE_PROVIDER_SITE_OTHER): Payer: Medicare Other | Admitting: Family Medicine

## 2016-04-19 ENCOUNTER — Emergency Department (HOSPITAL_COMMUNITY): Payer: Medicare Other

## 2016-04-19 ENCOUNTER — Emergency Department (HOSPITAL_COMMUNITY)
Admission: EM | Admit: 2016-04-19 | Discharge: 2016-04-20 | Disposition: A | Discharge status: Home / Self Care | Payer: Medicare Other | Attending: Emergency Medicine | Admitting: Emergency Medicine

## 2016-04-19 VITALS — BP 136/74 | HR 74 | Temp 97.8°F | Wt 160.2 lb

## 2016-04-19 DIAGNOSIS — Z85828 Personal history of other malignant neoplasm of skin: Secondary | ICD-10-CM | POA: Insufficient documentation

## 2016-04-19 DIAGNOSIS — S32049A Unspecified fracture of fourth lumbar vertebra, initial encounter for closed fracture: Secondary | ICD-10-CM | POA: Diagnosis not present

## 2016-04-19 DIAGNOSIS — R300 Dysuria: Secondary | ICD-10-CM | POA: Diagnosis not present

## 2016-04-19 DIAGNOSIS — S32040A Wedge compression fracture of fourth lumbar vertebra, initial encounter for closed fracture: Secondary | ICD-10-CM

## 2016-04-19 DIAGNOSIS — R339 Retention of urine, unspecified: Secondary | ICD-10-CM | POA: Diagnosis not present

## 2016-04-19 DIAGNOSIS — R52 Pain, unspecified: Secondary | ICD-10-CM

## 2016-04-19 DIAGNOSIS — N3001 Acute cystitis with hematuria: Secondary | ICD-10-CM | POA: Insufficient documentation

## 2016-04-19 DIAGNOSIS — M4856XA Collapsed vertebra, not elsewhere classified, lumbar region, initial encounter for fracture: Secondary | ICD-10-CM | POA: Insufficient documentation

## 2016-04-19 DIAGNOSIS — R102 Pelvic and perineal pain: Secondary | ICD-10-CM | POA: Diagnosis not present

## 2016-04-19 LAB — POC URINALSYSI DIPSTICK (AUTOMATED)
Bilirubin, UA: NEGATIVE
Glucose, UA: NEGATIVE
Ketones, UA: NEGATIVE
Nitrite, UA: NEGATIVE
Protein, UA: NEGATIVE
Spec Grav, UA: 1.015
Urobilinogen, UA: 0.2
pH, UA: 6

## 2016-04-19 LAB — URINALYSIS, ROUTINE W REFLEX MICROSCOPIC
Bilirubin Urine: NEGATIVE
GLUCOSE, UA: NEGATIVE mg/dL
KETONES UR: 5 mg/dL — AB
Nitrite: NEGATIVE
PROTEIN: NEGATIVE mg/dL
Specific Gravity, Urine: 1.002 — ABNORMAL LOW (ref 1.005–1.030)
pH: 8 (ref 5.0–8.0)

## 2016-04-19 LAB — CBC WITH DIFFERENTIAL/PLATELET
BASOS ABS: 0 10*3/uL (ref 0.0–0.1)
BASOS PCT: 1 %
Eosinophils Absolute: 0 10*3/uL (ref 0.0–0.7)
Eosinophils Relative: 1 %
HEMATOCRIT: 38 % (ref 36.0–46.0)
Hemoglobin: 13.6 g/dL (ref 12.0–15.0)
LYMPHS PCT: 34 %
Lymphs Abs: 2.2 10*3/uL (ref 0.7–4.0)
MCH: 32.2 pg (ref 26.0–34.0)
MCHC: 35.8 g/dL (ref 30.0–36.0)
MCV: 89.8 fL (ref 78.0–100.0)
MONO ABS: 0.4 10*3/uL (ref 0.1–1.0)
Monocytes Relative: 6 %
NEUTROS ABS: 3.7 10*3/uL (ref 1.7–7.7)
NEUTROS PCT: 58 %
Platelets: 251 10*3/uL (ref 150–400)
RBC: 4.23 MIL/uL (ref 3.87–5.11)
RDW: 12.5 % (ref 11.5–15.5)
WBC: 6.4 10*3/uL (ref 4.0–10.5)

## 2016-04-19 LAB — BASIC METABOLIC PANEL
ANION GAP: 11 (ref 5–15)
BUN: 7 mg/dL (ref 6–20)
CALCIUM: 9.4 mg/dL (ref 8.9–10.3)
CO2: 21 mmol/L — AB (ref 22–32)
Chloride: 99 mmol/L — ABNORMAL LOW (ref 101–111)
Creatinine, Ser: 0.78 mg/dL (ref 0.44–1.00)
Glucose, Bld: 85 mg/dL (ref 65–99)
POTASSIUM: 3.8 mmol/L (ref 3.5–5.1)
Sodium: 131 mmol/L — ABNORMAL LOW (ref 135–145)

## 2016-04-19 MED ORDER — DIAZEPAM 5 MG PO TABS
5.0000 mg | ORAL_TABLET | Freq: Once | ORAL | Status: AC
  Administered 2016-04-19: 5 mg via ORAL
  Filled 2016-04-19: qty 1

## 2016-04-19 MED ORDER — HYDROCODONE-ACETAMINOPHEN 5-325 MG PO TABS
1.0000 | ORAL_TABLET | Freq: Once | ORAL | Status: AC
  Administered 2016-04-19: 1 via ORAL
  Filled 2016-04-19: qty 1

## 2016-04-19 MED ORDER — HYDROCODONE-ACETAMINOPHEN 5-325 MG PO TABS: 1.0000 | ORAL_TABLET | Freq: Four times a day (QID) | ORAL | 0 refills | Status: DC | PRN

## 2016-04-19 MED ORDER — CEPHALEXIN 500 MG PO CAPS: 500.0000 mg | ORAL_CAPSULE | Freq: Two times a day (BID) | ORAL | 0 refills | Status: DC

## 2016-04-19 MED ORDER — CEPHALEXIN 500 MG PO CAPS
500.0000 mg | ORAL_CAPSULE | Freq: Once | ORAL | Status: AC
  Administered 2016-04-20: 500 mg via ORAL
  Filled 2016-04-19: qty 1

## 2016-04-19 MED ORDER — CIPROFLOXACIN HCL 250 MG PO TABS: 250.0000 mg | ORAL_TABLET | Freq: Two times a day (BID) | ORAL | 0 refills | Status: DC

## 2016-04-19 NOTE — Progress Notes (Addendum)
Ashley Lawson is a 83 y.o. female here for a worsening of an existing problem.  History of Present Illness:   1. Suprapubic pain. Long Hx of the same. Exacerbation today. Patient is crying and unable to get comfortable. "I feel like I am going to pop."   Records reviewed. See thorough summary from 04/08/16 note. Negative CT scan, ER visit x 2, evaluation by Urology PA x 1. An abdominal MRI was negative this week. None of the prior medications tried have been helpful, including the most recently tried Valium and Dicyclomine.   PMHx, SurgHx, SocialHx, Medications, and Allergies were reviewed in the Visit Navigator and updated as appropriate.  Current Medications:   Current Outpatient Prescriptions:  .  acetaminophen (TYLENOL) 500 MG tablet, Take 500 mg by mouth every 6 (six) hours as needed for mild pain, moderate pain or headache., Disp: , Rfl:  .  Ascorbic Acid (VITAMIN C) 100 MG tablet, Take 100 mg by mouth daily.  , Disp: , Rfl:  .  calcium carbonate (OS-CAL) 1250 (500 Ca) MG chewable tablet, Chew 1 tablet by mouth 2 (two) times daily., Disp: , Rfl:  .  diazepam (VALIUM) 5 MG tablet, Take 1 tablet (5 mg total) by mouth every 8 (eight) hours as needed for muscle spasms., Disp: 60 tablet, Rfl: 0 .  diclofenac sodium (VOLTAREN) 1 % GEL, Apply 4 g topically 4 (four) times daily., Disp: 100 g, Rfl: 0 .  dicyclomine (BENTYL) 20 MG tablet, Take 1 tablet (20 mg total) by mouth every 6 (six) hours. (Patient taking differently: Take 20 mg by mouth 3 (three) times daily as needed for spasms. ), Disp: 60 tablet, Rfl: 0 .  fish oil-omega-3 fatty acids 1000 MG capsule, Take 1 g by mouth daily. , Disp: , Rfl:  .  fluticasone (FLONASE) 50 MCG/ACT nasal spray, 2 spray each nostril qd (Patient taking differently: Place 2 sprays into both nostrils daily as needed for allergies. ), Disp: 16 g, Rfl: 3 .  Loratadine 10 MG CAPS, Take 1 capsule (10 mg total) by mouth daily., Disp: 30 each, Rfl: 2 .  MULTIPLE VITAMIN  PO, Take 1 tablet by mouth daily. , Disp: , Rfl:  .  Probiotic Product (ALIGN PO), Take 1 tablet by mouth daily., Disp: , Rfl:  .  ranitidine (ZANTAC) 150 MG tablet, Take 1 tablet (150 mg total) by mouth 2 (two) times daily., Disp: 60 tablet, Rfl: 2 .  RESTASIS 0.05 % ophthalmic emulsion, Place 1 drop into both eyes 2 (two) times daily., Disp: , Rfl: 3 .  senna-docusate (SENOKOT-S) 8.6-50 MG tablet, Take 1 tablet by mouth daily., Disp: , Rfl:  .  ALPRAZolam (XANAX) 0.25 MG tablet, Take 0.25 mg by mouth at bedtime as needed for sleep., Disp: , Rfl: 0   Review of Systems:   Constitutional: Negative for fever, chills and unexpected weight change.  HEENT: Negative for ear discharge, ear pain, mouth sores, sore throat, tinnitus and trouble swallowing.  No eye pain. LUNGS: Negative for cough, choking, chest tightness, shortness of breath and wheezing.   CV: Negative for chest pain, palpitations and leg swelling.   MSK:: Negative for unusual  joint swelling or pains, gait problem, neck pain and neck stiffness.  NEURO: Negative for dizziness, tremors, speech difficulty, weakness and headaches. No visual changes.   Vitals:   Vitals:   04/19/16 1522  BP: 136/74  Pulse: 74  Temp: 97.8 F (36.6 C)  TempSrc: Oral  SpO2: 99%  Weight: 160  lb 3.2 oz (72.7 kg)     Body mass index is 32.08 kg/m.  Physical Exam:   General: Alert, cooperative, appears stated age and in acute distress.  HEENT:  Normocephalic, without obvious abnormality, atraumatic. Conjunctivae/corneas clear. PERRL, EOM's intact. Normal TM's and external ear canals both ears. Nares normal. Septum midline. Mucosa normal. No drainage or sinus tenderness. Lips, mucosa, and tongue normal; teeth and gums normal.  Lungs: Clear to auscultation bilaterally.  Heart:: Regular rate and rhythm, S1, S2 normal, no murmur, click, rub or gallop.  Abdomen: Soft, ttp suprapubic area  Extremities: Extremities normal, atraumatic, no cyanosis or  edema.  Pulses: 2+ and symmetric.  Skin: Skin color, texture, turgor normal. No rashes or lesions.  Neurologic: Alert and oriented X 3, normal strength and tone. Normal symmetric. reflexes. Normal coordination and gait.  Psych: Alert,oriented.    Results for orders placed or performed in visit on 04/19/16  POCT Urinalysis Dipstick (Automated)  Result Value Ref Range   Color, UA yellow    Clarity, UA clear    Glucose, UA n    Bilirubin, UA n    Ketones, UA n    Spec Grav, UA 1.015    Blood, UA 2+    pH, UA 6.0    Protein, UA n    Urobilinogen, UA 0.2    Nitrite, UA n    Leukocytes, UA small (1+) (A) Negative     Assessment and Plan:    Ashley Lawson was seen today for urinary tract infection.  Diagnoses and all orders for this visit:  Dysuria -     POCT Urinalysis Dipstick (automated) - + leukocytes -     Urine culture -     ciprofloxacin (CIPRO) 250 MG tablet; Take 1 tablet (250 mg total) by mouth 2 (two) times daily.  Urine retention Comments: I and O completed in usual manner in office successfully. > 300 cc post void residual, with suspected urine still in bladder.  Noted spasms. Patient very uncomfortable, even after I and O cath.  She kept asking for admission to the hospital to help her "rest." Discussed options with patient. Opted to go to ER now for pain control and further evaluation.  Informed patient's daughter.   . Reviewed expectations re: course of current medical issues. . Patient verbalized understanding and all questions were answered. . See orders for this visit as documented in the electronic medical record. . Patient received an After-Visit Summary.   Briscoe Deutscher, D.O.

## 2016-04-19 NOTE — ED Provider Notes (Signed)
[facility] DEPT Provider Note   CSN: [id number] Arrival date & time: 04/19/16  1658     History   Chief Complaint Chief Complaint  Patient presents with  . Urinary Tract Infection  . Urinary Retention    HPI Ashley Lawson is a 83 y.o. female.  Patient is an 83 year old female with a history of abdominal pain, IBS, hemorrhoids, PMR who presents from PCP office with severe pelvic discomfort and rectal pain. Patient's daughter is present with her and states for the last several months she has had persistently worsening bladder pain, spasm and rectal pain. She also gets back pain that radiates down her left leg to the mid thigh. She denies any extremity weakness or difficulty walking. She has been seen multiple times in the last few months by PCP, urology and orthopedics. She is also been evaluated by GI. She has had multiple CAT scans done which were within normal limits and had an MRI of the abdomen and pelvis done most recently which is also within normal limits. Patient sometimes urinates constantly and at times feels like she hesitates to urinate. She has had no bowel incontinence. When she was at her doctor's office they attempted to place a catheter repeatedly and were unable to. This made patient's pain significantly worse. She denies any fever, nausea or vomiting. She has been taking Valium and digit pain for spasm and pain. Daughter states that Valium does seem to help with the spasm but it also makes her tired if she takes it 3 times a day. She did have tramadol for pain but she is not taking.   The history is provided by the patient and a relative.    Past Medical History:  Diagnosis Date  . Abdominal pain 12/2009   hospitalized  . Allergic rhinitis   . Diverticulosis of colon   . Dog bite(E906.0) 08/30/2011  . GERD (gastroesophageal reflux disease)   . Hepatic steatosis   . Hyperlipidemia   . Hyperplastic colon polyp   . IBS (irritable bowel syndrome)    constipation  predominant  . Internal hemorrhoids   . Osteoporosis    dexa 2011 -2.7 hip nl spine  . PMR (polymyalgia rheumatica) (HCC) 08/30/2011   under rheum care  on low dose pred 5 mg   . Polymyalgia rheumatica [facility])     Patient Active Problem List   Diagnosis Date Noted  . Rhinitis, allergic 05/31/2015  . Renal cyst incidental  01/19/2015  . Heartburn 01/14/2014  . Gastroesophageal reflux disease without esophagitis 01/14/2014  . Osteoporosis 01/14/2014  . IBS (irritable bowel syndrome)   . Nail, ingrown 06/22/2012  . Arthritis 02/19/2012  . Screening, lipid 02/18/2011  . Rash and nonspecific skin eruption 02/18/2011  . Visit for preventive health examination 02/18/2011  . Skin cancer 10/14/2010  . Abdominal bloating 10/08/2010  . CONSTIPATION, CHRONIC 01/15/2010  . Lower urinary tract symptoms (LUTS) 01/15/2010  . SUPRAPUBIC PAIN 08/01/2009  . ABDOMINAL PAIN, EPIGASTRIC 06/16/2009  . DIVERTICULOSIS, COLON 06/07/2007  . Hyperlipidemia 06/05/2007  . ANXIETY STATE, UNSPECIFIED 06/05/2007  . Allergic rhinitis 06/05/2007  . IBS 06/05/2007  . OSTEOPOROSIS 06/05/2007  . ABDOMINAL PAIN, GENERALIZED 06/05/2007    Past Surgical History:  Procedure Laterality Date  . ABDOMINAL HYSTERECTOMY    . CATARACT EXTRACTION  2010   both    OB History    No data available       Home Medications    Prior to Admission medications   Medication Sig Start Date  End Date Taking? Authorizing Provider  acetaminophen (TYLENOL) 500 MG tablet Take 500 mg by mouth every 6 (six) hours as needed for mild pain, moderate pain or headache.   Yes Historical Provider, MD  ALPRAZolam Duanne Moron) 0.25 MG tablet Take 0.25 mg by mouth at bedtime as needed for sleep. 01/12/16  Yes Historical Provider, MD  Ascorbic Acid (VITAMIN C) 100 MG tablet Take 100 mg by mouth daily.     Yes Historical Provider, MD  calcium carbonate (OS-CAL) 1250 (500 Ca) MG chewable tablet Chew 1 tablet by mouth 2 (two) times daily.   Yes  Historical Provider, MD  diazepam (VALIUM) 5 MG tablet Take 1 tablet (5 mg total) by mouth every 8 (eight) hours as needed for muscle spasms. 04/08/16  Yes Laurey Morale, MD  diclofenac sodium (VOLTAREN) 1 % GEL Apply 4 g topically 4 (four) times daily. 04/04/16  Yes Nicole Pisciotta, PA-C  dicyclomine (BENTYL) 20 MG tablet Take 1 tablet (20 mg total) by mouth every 6 (six) hours. Patient taking differently: Take 20 mg by mouth 3 (three) times daily as needed for spasms.  04/08/16  Yes Laurey Morale, MD  fish oil-omega-3 fatty acids 1000 MG capsule Take 1 g by mouth daily.    Yes Historical Provider, MD  fluticasone (FLONASE) 50 MCG/ACT nasal spray 2 spray each nostril qd Patient taking differently: Place 2 sprays into both nostrils daily as needed for allergies.  06/16/12  Yes Burnis Medin, MD  Loratadine 10 MG CAPS Take 1 capsule (10 mg total) by mouth daily. 04/04/16  Yes Nicole Pisciotta, PA-C  MULTIPLE VITAMIN PO Take 1 tablet by mouth daily.    Yes Historical Provider, MD  Probiotic Product (ALIGN PO) Take 1 tablet by mouth daily.   Yes Historical Provider, MD  ranitidine (ZANTAC) 150 MG tablet Take 1 tablet (150 mg total) by mouth 2 (two) times daily. 04/05/16 05/05/16 Yes Willia Craze, NP  RESTASIS 0.05 % ophthalmic emulsion Place 1 drop into both eyes 2 (two) times daily. 10/26/14  Yes Historical Provider, MD  senna-docusate (SENOKOT-S) 8.6-50 MG tablet Take 1 tablet by mouth daily.   Yes Historical Provider, MD  cephALEXin (KEFLEX) 500 MG capsule Take 1 capsule (500 mg total) by mouth 2 (two) times daily. 04/19/16   Blanchie Dessert, MD  ciprofloxacin (CIPRO) 250 MG tablet Take 1 tablet (250 mg total) by mouth 2 (two) times daily. 04/19/16   Briscoe Deutscher, DO  HYDROcodone-acetaminophen (NORCO/VICODIN) 5-325 MG tablet Take 1 tablet by mouth every 6 (six) hours as needed for severe pain. 04/19/16   Blanchie Dessert, MD    Family History Family History  Problem Relation Age of Onset  . Other Mother      fx hip  . Other Sister     fx hip  . Bladder Cancer Brother   . Colon cancer Neg Hx   . Esophageal cancer Neg Hx   . Stomach cancer Neg Hx   . Rectal cancer Neg Hx   . Liver cancer Neg Hx     Social History Social History  Substance Use Topics  . Smoking status: Never Smoker  . Smokeless tobacco: Never Used  . Alcohol use 0.0 oz/week     Comment: occ     Allergies   Alendronate sodium; Amoxicillin; and Flagyl [metronidazole]   Review of Systems Review of Systems  All other systems reviewed and are negative.    Physical Exam Updated Vital Signs BP 135/79 (BP Location: Right Arm)  Pulse 106   Temp 97.5 F (36.4 C)   Resp 18   Ht 4' 11"  (1.499 m)   Wt 160 lb (72.6 kg)   SpO2 99%   BMI 32.32 kg/m   Physical Exam  Constitutional: She is oriented to person, place, and time. She appears well-developed and well-nourished. She appears distressed.  Rocking in bed appearing to be in acute pain  HENT:  Head: Normocephalic and atraumatic.  Mouth/Throat: Oropharynx is clear and moist.  Eyes: Conjunctivae and EOM are normal. Pupils are equal, round, and reactive to light.  Neck: Normal range of motion. Neck supple.  Cardiovascular: Normal rate, regular rhythm and intact distal pulses.   No murmur heard. Pulmonary/Chest: Effort normal and breath sounds normal. No respiratory distress. She has no wheezes. She has no rales.  Abdominal: Soft. She exhibits no distension. There is tenderness in the right lower quadrant, suprapubic area and left lower quadrant. There is no rebound, no guarding and no CVA tenderness.    Genitourinary: Rectal exam shows external hemorrhoid. Rectal exam shows no tenderness and anal tone normal.  Musculoskeletal: Normal range of motion. She exhibits no edema or tenderness.       Lumbar back: She exhibits bony tenderness and pain.       Back:  Neurological: She is alert and oriented to person, place, and time. She has normal strength. No  sensory deficit.  Skin: Skin is warm and dry. No rash noted. No erythema.  Psychiatric: She has a normal mood and affect. Her behavior is normal.  Nursing note and vitals reviewed.    ED Treatments / Results  Labs (all labs ordered are listed, but only abnormal results are displayed) Labs Reviewed  URINALYSIS, ROUTINE W REFLEX MICROSCOPIC - Abnormal; Notable for the following:       Result Value   Color, Urine STRAW (*)    Specific Gravity, Urine 1.002 (*)    Hgb urine dipstick MODERATE (*)    Ketones, ur 5 (*)    Leukocytes, UA MODERATE (*)    Bacteria, UA RARE (*)    Squamous Epithelial / LPF 0-5 (*)    All other components within normal limits  BASIC METABOLIC PANEL - Abnormal; Notable for the following:    Sodium 131 (*)    Chloride 99 (*)    CO2 21 (*)    All other components within normal limits  URINE CULTURE  CBC WITH DIFFERENTIAL/PLATELET    EKG  EKG Interpretation None       Radiology Mr Lumbar Spine Wo Contrast  Result Date: 04/19/2016 CLINICAL DATA:  Suprapubic pain, urinary retention. EXAM: MRI LUMBAR SPINE WITHOUT CONTRAST TECHNIQUE: Multiplanar, multisequence MR imaging of the lumbar spine was performed. No intravenous contrast was administered. COMPARISON:  MRI abdomen and pelvis April 14, 2016 and CT abdomen and pelvis April 04, 2016 FINDINGS: SEGMENTATION: For the purposes of this report, the last well-formed intervertebral disc will be described as L5-S1. ALIGNMENT: Maintenance of the lumbar lordosis. No malalignment. VERTEBRAE:Acute L4 superior endplate 2 column compression fracture with low T1 and bright STIR signal with less than 15% height loss. Generally bright T1 bone marrow signal compatible with osteopenia. Acute to subacute L1 superior endplate Schmorl's node. Mild to moderate L2-3 through L5-S1 disc height loss with decreased T2 signal within the disc compatible with desiccation. Minimal acute discogenic endplate changes O2-9. Scattered old  Schmorl's nodes. CONUS MEDULLARIS: Conus medullaris terminates at L1-2 and demonstrates normal morphology and signal characteristics. Cauda equina is normal.  PARASPINAL AND SOFT TISSUES: Included prevertebral and paraspinal soft tissues are nonacute. Mildly distended urinary bladder. DISC LEVELS: T12-L1: Not evaluated on axial sequences): 4 mm broad-based disc bulge without canal stenosis or neural foraminal narrowing. L1-2: No disc bulge, canal stenosis nor neural foraminal narrowing. L2-3: Annular bulging. No canal stenosis or neural foraminal narrowing. L3-4: 4 mm broad-based disc bulge. Mild facet arthropathy and ligamentum flavum redundancy. Moderate canal stenosis. Mild bilateral neural foraminal narrowing. L4-5: 3 mm broad-based disc bulge. Mild facet arthropathy and ligamentum flavum redundancy. No canal stenosis. Minimal to mild bilateral caudal neural foraminal narrowing. L5-S1: 3 mm broad-based disc bulge is insert laterally could affect the exited L5 nerves. Mild facet arthropathy. No canal stenosis. Moderate to severe bilateral neural foraminal narrowing. IMPRESSION: Acute L4 compression fracture (less than 15% height loss). Generalized bone marrow signal compatible with osteopenia. Degenerative lumbar spine resulting in moderate canal stenosis L3-4. Neural foraminal narrowing L3-4 through L5-S1: Moderate to severe at L5-S1. Electronically Signed   By: Elon Alas M.D.   On: 04/19/2016 22:08    Procedures Procedures (including critical care time)  Medications Ordered in ED Medications  cephALEXin (KEFLEX) capsule 500 mg (not administered)  HYDROcodone-acetaminophen (NORCO/VICODIN) 5-325 MG per tablet 1 tablet (1 tablet Oral Given 04/19/16 1801)  diazepam (VALIUM) tablet 5 mg (5 mg Oral Given 04/19/16 1930)     Initial Impression / Assessment and Plan / ED Course  I have reviewed the triage vital signs and the nursing notes.  Pertinent labs & imaging results that were available  during my care of the patient were reviewed by me and considered in my medical decision making (see chart for details).     Patient is an elderly female presenting today with severe pain in her pelvis and rectum as well as back pain.  Symptoms have been progressively worse over the last several months where she has seen multiple specialists at her primary physician without improvement of her symptoms. She has intermittently been treated for infection without significant improvement. Patient sometimes has urgency but also sometimes has symptoms of hesitation. Today he attempted catheterization multiple times which made all of her symptoms much worse. They were unable to place a catheter. They sent her here for further care. Bladder scan here shows 150 mL's and patient was able to urinate spontaneously. This time do not feel that patient needs a Foley catheter. Patient recently had an MRI done of the abdomen and pelvis which showed no acute findings suggestive for her pain. Her labs today are within normal limits except for a urine with moderate leukocytes and 6-30 white blood cells which was cultured. Because of her significant pain she has never had any imaging of her back and that was done today which showed a new acute compression fracture of L4 15% height which may be adding into some of her pain. Patient states she does occasionally sit down hard in the bathtub but denies any recent falls. She does have a history of osteoporosis. MRI did not show any spinal cord impingement but did have multiple bulging disc and facet arthropathy. Possibility that some of this could be causing radicular symptoms. She is already associated with an orthopedist who she will go back and see for this. Will treat with Keflex for possible UTI today but also explained that we will be sending a culture. Encouraged patient to follow back up with urology. Patient was given Vicodin to take as needed for pain and she has Valium at home.  She  will stop taking tramadol which she was not taking anyway and will continue ditropan.  Findings were discussed with her children with the patient present. They are comfortable with this plan and discharged home.  Final Clinical Impressions(s) / ED Diagnoses   Final diagnoses:  Pain  Acute cystitis with hematuria  Closed compression fracture of fourth lumbar vertebra, initial encounter (HCC)    New Prescriptions New Prescriptions   CEPHALEXIN (KEFLEX) 500 MG CAPSULE    Take 1 capsule (500 mg total) by mouth 2 (two) times daily.   HYDROCODONE-ACETAMINOPHEN (NORCO/VICODIN) 5-325 MG TABLET    Take 1 tablet by mouth every 6 (six) hours as needed for severe pain.     Blanchie Dessert, MD 04/19/16 [phone]

## 2016-04-19 NOTE — ED Triage Notes (Signed)
Pt sent from [facility] due to urinary difficulties, they were unable to cath and was unable. Pt very uncomfortable.

## 2016-04-19 NOTE — Progress Notes (Signed)
Pre visit review using our clinic review tool, if applicable. No additional management support is needed unless otherwise documented below in the visit note. 

## 2016-04-20 NOTE — Progress Notes (Addendum)
Addendum. 04/20/2016   ER records reviewed. Clarification: I and O cath was successfully completed on patient in the office on 04/19/2016 with > 300 cc urine output. Patient with no relief.

## 2016-04-21 LAB — URINE CULTURE: Culture: <10000 — AB

## 2016-04-22 ENCOUNTER — Telehealth: Payer: Self-pay | Admitting: Internal Medicine

## 2016-04-22 ENCOUNTER — Ambulatory Visit: Payer: Medicare Other | Admitting: Internal Medicine

## 2016-04-22 ENCOUNTER — Ambulatory Visit: Payer: Medicare Other | Admitting: Family Medicine

## 2016-04-22 DIAGNOSIS — S32050A Wedge compression fracture of fifth lumbar vertebra, initial encounter for closed fracture: Secondary | ICD-10-CM

## 2016-04-22 NOTE — Telephone Encounter (Signed)
Pt 's daughter called to ask for a referral for pt to see  Dr Ellene Route at [facility]  [phone] Pt has compression fracture at L4  Pt was called by our office and was advised we may could get pt an earlier appt if they told us who she would like to see.

## 2016-04-23 NOTE — Telephone Encounter (Signed)
OK TO REFER?

## 2016-04-23 NOTE — Telephone Encounter (Signed)
Referral placed in the sytem.  Pt scheduled for 04/24/16 @ noon for follow up.

## 2016-04-23 NOTE — Telephone Encounter (Signed)
Okay to refer However she also needs follow-up for her bladder pain and urinary retention. I reviewed the notes from the emergency room and visits last week. She is not emptying her bladder fully .   I want to speak with   Her urologist to help facilitate any helpful interventions. Please have her make appt with me  alos  ( 30 minutes)

## 2016-04-24 ENCOUNTER — Encounter: Payer: Self-pay | Admitting: Internal Medicine

## 2016-04-24 ENCOUNTER — Ambulatory Visit (INDEPENDENT_AMBULATORY_CARE_PROVIDER_SITE_OTHER): Payer: Medicare Other | Admitting: Internal Medicine

## 2016-04-24 VITALS — BP 118/72 | Temp 97.4°F | Ht 59.0 in | Wt 159.0 lb

## 2016-04-24 DIAGNOSIS — R1032 Left lower quadrant pain: Secondary | ICD-10-CM

## 2016-04-24 DIAGNOSIS — R102 Pelvic and perineal pain: Secondary | ICD-10-CM

## 2016-04-24 DIAGNOSIS — R300 Dysuria: Secondary | ICD-10-CM | POA: Diagnosis not present

## 2016-04-24 DIAGNOSIS — Z87440 Personal history of urinary (tract) infections: Secondary | ICD-10-CM | POA: Diagnosis not present

## 2016-04-24 DIAGNOSIS — G8929 Other chronic pain: Secondary | ICD-10-CM

## 2016-04-24 DIAGNOSIS — S32050A Wedge compression fracture of fifth lumbar vertebra, initial encounter for closed fracture: Secondary | ICD-10-CM | POA: Diagnosis not present

## 2016-04-24 DIAGNOSIS — R399 Unspecified symptoms and signs involving the genitourinary system: Secondary | ICD-10-CM

## 2016-04-24 DIAGNOSIS — R339 Retention of urine, unspecified: Secondary | ICD-10-CM

## 2016-04-24 MED ORDER — TAMSULOSIN HCL 0.4 MG PO CAPS
0.4000 mg | ORAL_CAPSULE | Freq: Every day | ORAL | 0 refills | Status: DC
Start: 2016-04-24 — End: 2017-01-24

## 2016-04-24 NOTE — Progress Notes (Signed)
Pre visit review using our clinic review tool, if applicable. No additional management support is needed unless otherwise documented below in the visit note.  Chief Complaint  Patient presents with  . Follow-up    HPI: Ashley Lawson Pain 83 y.o. lean in upon request for follow-up of ongoing suprapubic left lower quadrant pain under various evaluations and new onset compression fracture. She's been battling these symptoms has seen at least 4 different providers in various settings since my last visit. Some were emergent some point office was closed or I was unavailable. She gets treated for urinary tract infection but the urine has no gross has had an MRI of her abdomen pelvis with no unusual findings. She was given high-dose Valium 5 mg 3 times a day and the thoughts of spasm causing her symptoms by another provider but this apparently isn't that helpful although it may help anxiety. Feels foggy with this   She has stated that she's been to the urologist in the past and they said she didn't have an infection. However I still do not have any office notes from last visit.  Has appt    Not until feb 15  Provider last week denying post void had 300 mL. Suspicious of urinary retention or dysfunction. She is here today upon my request to review her previous visits and what the next step could be.  Onset  Back pain  One am no fall     And pain ful but ahrd to delinieat  Still has lower abd pain  Hx of radiating pain right leg  And had shot at one time but nis is different.   She has an appointment with neurosurgery on February 4 01 at to see Dr. Ellene Route as early as possible. But his appointments for further out. Asks if we can help move up her appointment. Also asks if we can move up her urology appointment. ROS: See pertinent positives and negatives per HPI.  Past Medical History:  Diagnosis Date  . Abdominal pain 12/2009   hospitalized  . Allergic rhinitis   . Diverticulosis of colon   . Dog  bite(E906.0) 08/30/2011  . GERD (gastroesophageal reflux disease)   . Hepatic steatosis   . Hyperlipidemia   . Hyperplastic colon polyp   . IBS (irritable bowel syndrome)    constipation predominant  . Internal hemorrhoids   . Osteoporosis    dexa 2011 -2.7 hip nl spine  . PMR (polymyalgia rheumatica) (HCC) 08/30/2011   under rheum care  on low dose pred 5 mg   . Polymyalgia rheumatica (HCC)     Family History  Problem Relation Age of Onset  . Other Mother     fx hip  . Other Sister     fx hip  . Bladder Cancer Brother   . Colon cancer Neg Hx   . Esophageal cancer Neg Hx   . Stomach cancer Neg Hx   . Rectal cancer Neg Hx   . Liver cancer Neg Hx     Social History   Social History  . Marital status: Widowed    Spouse name: N/A  . Number of children: N/A  . Years of education: N/A   Occupational History  . retired    Social History Main Topics  . Smoking status: Never Smoker  . Smokeless tobacco: Never Used  . Alcohol use 0.0 oz/week     Comment: occ  . Drug use: No  . Sexual activity: Not Asked   Other Topics  Concern  . None   Social History Narrative   Retired   Regular exercise- yes   Widowed   At home with children and GKs    HH of 2     Puppy   Plays cards is social and active     Outpatient Medications Prior to Visit  Medication Sig Dispense Refill  . acetaminophen (TYLENOL) 500 MG tablet Take 500 mg by mouth every 6 (six) hours as needed for mild pain, moderate pain or headache.    . ALPRAZolam (XANAX) 0.25 MG tablet Take 0.25 mg by mouth at bedtime as needed for sleep.  0  . Ascorbic Acid (VITAMIN C) 100 MG tablet Take 100 mg by mouth daily.      . calcium carbonate (OS-CAL) 1250 (500 Ca) MG chewable tablet Chew 1 tablet by mouth 2 (two) times daily.    . cephALEXin (KEFLEX) 500 MG capsule Take 1 capsule (500 mg total) by mouth 2 (two) times daily. 14 capsule 0  . diazepam (VALIUM) 5 MG tablet Take 1 tablet (5 mg total) by mouth every 8 (eight)  hours as needed for muscle spasms. 60 tablet 0  . diclofenac sodium (VOLTAREN) 1 % GEL Apply 4 g topically 4 (four) times daily. 100 g 0  . dicyclomine (BENTYL) 20 MG tablet Take 1 tablet (20 mg total) by mouth every 6 (six) hours. (Patient taking differently: Take 20 mg by mouth 3 (three) times daily as needed for spasms. ) 60 tablet 0  . fish oil-omega-3 fatty acids 1000 MG capsule Take 1 g by mouth daily.     . fluticasone (FLONASE) 50 MCG/ACT nasal spray 2 spray each nostril qd (Patient taking differently: Place 2 sprays into both nostrils daily as needed for allergies. ) 16 g 3  . HYDROcodone-acetaminophen (NORCO/VICODIN) 5-325 MG tablet Take 1 tablet by mouth every 6 (six) hours as needed for severe pain. 15 tablet 0  . Loratadine 10 MG CAPS Take 1 capsule (10 mg total) by mouth daily. 30 each 2  . MULTIPLE VITAMIN PO Take 1 tablet by mouth daily.     . Probiotic Product (ALIGN PO) Take 1 tablet by mouth daily.    . ranitidine (ZANTAC) 150 MG tablet Take 1 tablet (150 mg total) by mouth 2 (two) times daily. 60 tablet 2  . RESTASIS 0.05 % ophthalmic emulsion Place 1 drop into both eyes 2 (two) times daily.  3  . senna-docusate (SENOKOT-S) 8.6-50 MG tablet Take 1 tablet by mouth daily.    . ciprofloxacin (CIPRO) 250 MG tablet Take 1 tablet (250 mg total) by mouth 2 (two) times daily. 6 tablet 0   No facility-administered medications prior to visit.      EXAM:  BP 118/72 (BP Location: Left Arm, Patient Position: Sitting, Cuff Size: Normal)   Temp 97.4 F (36.3 C) (Oral)   Ht 4' 11"  (1.499 m)   Wt 159 lb (72.1 kg)   BMI 32.11 kg/m   Body mass index is 32.11 kg/m.  GENERAL: vitals reviewed and listed above, alert, oriented, appears well hydrated and in no acute distress HEENT: atraumatic, conjunctiva  clear, no obvious abnormalities on inspection of external nose and ears OP : no lesion edema or exudate  NECK: no obvious masses on inspection palpation  LUNGS: clear to auscultation  bilaterally, no wheezes, rales or rhonchi, good air movement CV: HRRR, no clubbing cyanosis or  peripheral edema nl cap refill  MS: moves all extremities without noticeable focal  abnormality  PSYCH: pleasant and cooperative, no obvious depression or anxiety Lab Results  Component Value Date   WBC 6.4 04/19/2016   HGB 13.6 04/19/2016   HCT 38.0 04/19/2016   PLT 251 04/19/2016   GLUCOSE 85 04/19/2016   CHOL 203 (H) 01/22/2016   TRIG 126.0 01/22/2016   HDL 67.80 01/22/2016   LDLDIRECT 125.8 02/23/2013   LDLCALC 110 (H) 01/22/2016   ALT 25 04/04/2016   AST 28 04/04/2016   NA 131 (L) 04/19/2016   K 3.8 04/19/2016   CL 99 (L) 04/19/2016   CREATININE 0.78 04/19/2016   BUN 7 04/19/2016   CO2 21 (L) 04/19/2016   TSH 4.18 01/22/2016    ASSESSMENT AND PLAN:  Discussed the following assessment and plan:  Painful urging to urinate  History of UTI  Closed compression fracture of fifth lumbar vertebra, initial encounter (HCC)  Suprapubic pain  Urine retention  Chronic LLQ pain  Lower urinary tract symptoms (LUTS)   Consideration of some of her lower pelvic pain is radiating from her back. I don't think the bladder dysfunction is related to the back   but will discuss with urology ...so many medicines that can interfere. She sounds like she has difficulty emptying because of pain and spasm but no active UTI. Uncertain what her medications are doing to help or harm her symptoms at this time although she states the Robaxin may have helped her. rx given ofr flomax w caution and to wean down on the valium.  Total visit 20mns > 50% spent counseling and coordinating care as indicated in above note and in instructions to patient .  -Patient advised to return or notify health care team  if symptoms worsen ,persist or new concerns arise.  Patient Instructions   I will contact urology about plan for follow-up. I'm concerned that you have some bladder retention that is adding to your  abdominal symptoms. Uncertain if we can get a quicker appointment with the neurosurgeon around your back issue. You probably should be on a medication for osteoporosis because that is the assumption of why you got a back fracture. But need to get opinoin from   Spine specialist .  The Valium can cause mental fogginess in doesn't appear to fix the problem. Decrease to 1 at night for the next week and then cut it in half and take 2.5 mg at night and then weaned   Off    Can try flomax in the interim to see if decreased the bladder spasm .    But dont have to try this  And wait until urology appt.  This med can cause  Some light headedness when standing up suddenly .    Lab Results  Component Value Date   WBC 6.4 04/19/2016   HGB 13.6 04/19/2016   HCT 38.0 04/19/2016   PLT 251 04/19/2016   GLUCOSE 85 04/19/2016   CHOL 203 (H) 01/22/2016   TRIG 126.0 01/22/2016   HDL 67.80 01/22/2016   LDLDIRECT 125.8 02/23/2013   LDLCALC 110 (H) 01/22/2016   ALT 25 04/04/2016   AST 28 04/04/2016   NA 131 (L) 04/19/2016   K 3.8 04/19/2016   CL 99 (L) 04/19/2016   CREATININE 0.78 04/19/2016   BUN 7 04/19/2016   CO2 21 (L) 04/19/2016   TSH 4.18 01/22/2016         Ilyanna Baillargeon K. Alinah Sheard M.D.  IMPRESSION: Acute L4 compression fracture (less than 15% height loss). Generalized bone marrow signal compatible with osteopenia.  Degenerative lumbar spine resulting in moderate canal stenosis L3-4.  Neural foraminal narrowing L3-4 through L5-S1: Moderate to severe at L5-S1. PARASPINAL AND SOFT TISSUES: Included prevertebral and paraspinal soft tissues are nonacute. Mildly distended urinary bladder.   Called dr Aileen Fass about situation and they will work on seeing her earlier . WKP

## 2016-04-24 NOTE — Patient Instructions (Addendum)
I will contact urology about plan for follow-up. I'm concerned that you have some bladder retention that is adding to your abdominal symptoms. Uncertain if we can get a quicker appointment with the neurosurgeon around your back issue. You probably should be on a medication for osteoporosis because that is the assumption of why you got a back fracture. But need to get opinoin from   Spine specialist .  The Valium can cause mental fogginess in doesn't appear to fix the problem. Decrease to 1 at night for the next week and then cut it in half and take 2.5 mg at night and then weaned   Off    Can try flomax in the interim to see if decreased the bladder spasm .    But dont have to try this  And wait until urology appt.  This med can cause  Some light headedness when standing up suddenly .    Lab Results  Component Value Date   WBC 6.4 04/19/2016   HGB 13.6 04/19/2016   HCT 38.0 04/19/2016   PLT 251 04/19/2016   GLUCOSE 85 04/19/2016   CHOL 203 (H) 01/22/2016   TRIG 126.0 01/22/2016   HDL 67.80 01/22/2016   LDLDIRECT 125.8 02/23/2013   LDLCALC 110 (H) 01/22/2016   ALT 25 04/04/2016   AST 28 04/04/2016   NA 131 (L) 04/19/2016   K 3.8 04/19/2016   CL 99 (L) 04/19/2016   CREATININE 0.78 04/19/2016   BUN 7 04/19/2016   CO2 21 (L) 04/19/2016   TSH 4.18 01/22/2016

## 2016-05-03 DIAGNOSIS — N3020 Other chronic cystitis without hematuria: Secondary | ICD-10-CM | POA: Diagnosis not present

## 2016-05-03 DIAGNOSIS — R350 Frequency of micturition: Secondary | ICD-10-CM | POA: Diagnosis not present

## 2016-05-03 DIAGNOSIS — R3982 Chronic bladder pain: Secondary | ICD-10-CM | POA: Diagnosis not present

## 2016-05-06 DIAGNOSIS — M5137 Other intervertebral disc degeneration, lumbosacral region: Secondary | ICD-10-CM | POA: Diagnosis not present

## 2016-05-06 DIAGNOSIS — M5416 Radiculopathy, lumbar region: Secondary | ICD-10-CM | POA: Diagnosis not present

## 2016-05-06 DIAGNOSIS — M4807 Spinal stenosis, lumbosacral region: Secondary | ICD-10-CM | POA: Diagnosis not present

## 2016-05-06 DIAGNOSIS — R030 Elevated blood-pressure reading, without diagnosis of hypertension: Secondary | ICD-10-CM | POA: Diagnosis not present

## 2016-05-13 ENCOUNTER — Telehealth: Payer: Self-pay | Admitting: Internal Medicine

## 2016-05-13 NOTE — Telephone Encounter (Signed)
Patient advised that she could try Miralax. She wants to set up an appt due to her chronic constipation. She will call back for any additional questions or concerns.

## 2016-05-16 DIAGNOSIS — N3020 Other chronic cystitis without hematuria: Secondary | ICD-10-CM | POA: Diagnosis not present

## 2016-05-21 DIAGNOSIS — M4807 Spinal stenosis, lumbosacral region: Secondary | ICD-10-CM | POA: Diagnosis not present

## 2016-05-29 DIAGNOSIS — M4807 Spinal stenosis, lumbosacral region: Secondary | ICD-10-CM | POA: Diagnosis not present

## 2016-06-03 DIAGNOSIS — M4807 Spinal stenosis, lumbosacral region: Secondary | ICD-10-CM | POA: Diagnosis not present

## 2016-06-12 DIAGNOSIS — R3982 Chronic bladder pain: Secondary | ICD-10-CM | POA: Diagnosis not present

## 2016-06-12 DIAGNOSIS — N3020 Other chronic cystitis without hematuria: Secondary | ICD-10-CM | POA: Diagnosis not present

## 2016-06-13 DIAGNOSIS — M4807 Spinal stenosis, lumbosacral region: Secondary | ICD-10-CM | POA: Diagnosis not present

## 2016-06-20 ENCOUNTER — Encounter: Payer: Self-pay | Admitting: Internal Medicine

## 2016-06-20 ENCOUNTER — Ambulatory Visit (INDEPENDENT_AMBULATORY_CARE_PROVIDER_SITE_OTHER): Payer: Medicare Other | Admitting: Internal Medicine

## 2016-06-20 VITALS — BP 138/70 | HR 78 | Resp 16 | Ht 59.0 in | Wt 161.0 lb

## 2016-06-20 DIAGNOSIS — M5136 Other intervertebral disc degeneration, lumbar region: Secondary | ICD-10-CM | POA: Diagnosis not present

## 2016-06-20 DIAGNOSIS — K594 Anal spasm: Secondary | ICD-10-CM

## 2016-06-20 DIAGNOSIS — S32040A Wedge compression fracture of fourth lumbar vertebra, initial encounter for closed fracture: Secondary | ICD-10-CM

## 2016-06-20 DIAGNOSIS — K5909 Other constipation: Secondary | ICD-10-CM | POA: Diagnosis not present

## 2016-06-20 MED ORDER — DILTIAZEM GEL 2 %: 1.0000 "application " | Freq: Two times a day (BID) | CUTANEOUS | 1 refills | Status: DC

## 2016-06-20 NOTE — Progress Notes (Signed)
Ashley Lawson 83 y.o. 01/31/1933 [id number]  Assessment & Plan:   1. Anal sphincter spasm   2. Chronic constipation   3. Closed compression fracture of fourth lumbar vertebra, initial encounter (Ashley Lawson)    I don't think her exacerbation of GI symptoms is as much due to hemorrhoids as it is due to some anal sphincter spasm. Interestingly she's had some of those problems with her bladder as well. I'm going to have her try diltiazem 2% topical gel or cream to the anal sphincter area inserted twice a day and return to see me in about 2 months. Should she not start feeling better as time progresses she can call back. We are going to change her laxative regimen to MiraLAX one dose at bedtime rather than stool softener and she can try to drop the Citrucel from her prune juice regimen. We that will help she can call back for advice in between appointments.  I do think she may be a candidate for pelvic floor physical therapy though I'm not sure what the role of her lumbar spine issues are in all of this she does also have degenerative lumbar spine disease with moderate L3-4 canal stenosis, neural foraminal narrowing L3-4 to L5-S1 which is moderate to severe at L5-S1. Defer to neurosurgery for comment on this if it is affecting her bowel and bladder.  I appreciate the opportunity to care for this patient. CC: Ashley Dawson, MD Ashley Lawson M.D. Ashley Lawson M.D.   Subjective:   Chief Complaint: Constipation and hemorrhoids  HPI Patient is a delightful elderly white woman previously followed by Dr. Delfin Lawson, she has a history of colon polyps and diverticulosis as well as rectal bleeding thought to be from hemorrhoids and chronic constipation. She has had problems with left lower quadrant pain off and on as well as suprapubic pain and urinary retention, especially in the last few months, CT scanning unrevealing in November 2017 and January 2018 as well as an MR of the abdomen. Ultimately she  was found to have an L4 compression fracture on MRI of the lumbar spine. She has had a cystoscopy and bladder dilation which seemed to help some. He is seeing neurosurgery, Dr. Saintclair Lawson and thinks she is improving over time. Though she's had chronic constipation for years, in general she has managed with a regimen, but lately it's worse she's having difficulty producing an having an effective bowel movement with a lot of the outlet complaints. She takes stool softeners at night and then in the morning she will take prune juice and Citrucel. But it takes her about 2-1/2 hours to have all of the bowel movements that she needs an it's a disruption to her quality of life. No bleeding except rare with straining to stool. Not a new issue and colonoscopy up to date. Frustrated bile with this though she notes improvement, she said she's had a rough winter with these problems and she is ready for them to be over. He does have follow-up with neurosurgery and urology pending. Primary care also.  Allergies  Allergen Reactions  . Alendronate Sodium     upset stomach  . Amoxicillin Rash    Has patient had a PCN reaction causing immediate rash, facial/tongue/throat swelling, SOB or lightheadedness with hypotension: yes Has patient had a PCN reaction causing severe rash involving mucus membranes or skin necrosis: no Has patient had a PCN reaction that required hospitalization: no Has patient had PCN reaction within the last 10 years: no  If  all of the above answers are "NO", then may proceed with Cephalosporin use.   . Flagyl [Metronidazole] Other (See Comments)    Pounding headaches patient says was bad.  With cipro for diverticulitis rx.   Current Meds  Medication Sig  . ALPRAZolam (XANAX) 0.25 MG tablet Take 0.25 mg by mouth at bedtime as needed for sleep.  . Ascorbic Acid (VITAMIN C) 100 MG tablet Take 100 mg by mouth daily.    . calcium carbonate (OS-CAL) 1250 (500 Ca) MG chewable tablet Chew 1 tablet by mouth  2 (two) times daily.  . cephALEXin (KEFLEX) 500 MG capsule Take 1 capsule (500 mg total) by mouth 2 (two) times daily.  . diazepam (VALIUM) 5 MG tablet Take 1 tablet (5 mg total) by mouth every 8 (eight) hours as needed for muscle spasms.  . diclofenac sodium (VOLTAREN) 1 % GEL Apply 4 g topically 4 (four) times daily.  Marland Kitchen dicyclomine (BENTYL) 20 MG tablet Take 1 tablet (20 mg total) by mouth every 6 (six) hours. (Patient taking differently: Take 20 mg by mouth 3 (three) times daily as needed for spasms. )  . fish oil-omega-3 fatty acids 1000 MG capsule Take 1 g by mouth daily.   . fluticasone (FLONASE) 50 MCG/ACT nasal spray 2 spray each nostril qd (Patient taking differently: Place 2 sprays into both nostrils daily as needed for allergies. )  . Loratadine 10 MG CAPS Take 1 capsule (10 mg total) by mouth daily.  . MULTIPLE VITAMIN PO Take 1 tablet by mouth daily.   . Probiotic Product (ALIGN PO) Take 1 tablet by mouth daily.  . RESTASIS 0.05 % ophthalmic emulsion Place 1 drop into both eyes 2 (two) times daily.  . tamsulosin (FLOMAX) 0.4 MG CAPS capsule Take 1 capsule (0.4 mg total) by mouth daily. For bladder spasm.  . [DISCONTINUED] HYDROcodone-acetaminophen (NORCO/VICODIN) 5-325 MG tablet Take 1 tablet by mouth every 6 (six) hours as needed for severe pain.  . [DISCONTINUED] senna-docusate (SENOKOT-S) 8.6-50 MG tablet Take 1 tablet by mouth daily.   Past Medical History:  Diagnosis Date  . Abdominal pain 12/2009   hospitalized  . Allergic rhinitis   . Diverticulosis of colon   . Dog bite(E906.0) 08/30/2011  . GERD (gastroesophageal reflux disease)   . Hepatic steatosis   . Hyperlipidemia   . Hyperplastic colon polyp   . IBS (irritable bowel syndrome)    constipation predominant  . Internal hemorrhoids   . Osteoporosis    dexa 2011 -2.7 hip nl spine  . PMR (polymyalgia rheumatica) (HCC) 08/30/2011   under rheum care  on low dose pred 5 mg   . Polymyalgia rheumatica (HCC)    Past  Surgical History:  Procedure Laterality Date  . ABDOMINAL HYSTERECTOMY    . CATARACT EXTRACTION  2010   both  . COLONOSCOPY  2016 was most recent   Social History   Social History  . Marital status: Widowed   Occupational History  . retired    Social History Main Topics  . Smoking status: Never Smoker  . Smokeless tobacco: Never Used  . Alcohol use 0.0 oz/week     Comment: occ  . Drug use: No   Social History Narrative   Retired   Regular exercise- yes   Widowed   At home with children and GKs    HH of 2     Puppy   Plays cards is social and active    Family History  Problem Relation Age of  Onset  . Other Mother     fx hip  . Other Sister     fx hip  . Bladder Cancer Brother   . Colon cancer Neg Hx   . Esophageal cancer Neg Hx   . Stomach cancer Neg Hx   . Rectal cancer Neg Hx   . Liver cancer Neg Hx    Review of Systems As above  Objective:   Physical Exam BP 138/70   Pulse 78   Resp 16   Ht 4' 11"  (1.499 m)   Wt 73 kg (161 lb)   BMI 32.52 kg/m  Well-developed well-nourished elderly white woman in no acute distress Eyes anicteric Rectal exam with Patti Martinique CMA present, normal anoderm except for some anal skin tags, there is no rash, increased resting sphincter tone, I cannot tell a septal squeeze when she is asked to increase sphincter tone, perhaps slight, simulated defecation exam shows appropriate abdominal wall contraction and what I think is normal descent and some relaxation of the anal sphincter Psych appropriate mood and affect

## 2016-06-20 NOTE — Patient Instructions (Addendum)
We have sent the following medications to [facility] for you to pick up at your convenience: Diltiazem gel (faxed to pharmacy)   Take one dose of miralax daily at bedtime and stop your stool softners and Citrucel.   Follow up with Dr Carlean Purl in 2 months but call us sooner if this regimen is not working.    I appreciate the opportunity to care for you. Silvano Rusk, MD, [facility]

## 2016-06-24 DIAGNOSIS — M5136 Other intervertebral disc degeneration, lumbar region: Secondary | ICD-10-CM | POA: Diagnosis not present

## 2016-06-26 ENCOUNTER — Ambulatory Visit (INDEPENDENT_AMBULATORY_CARE_PROVIDER_SITE_OTHER): Payer: Medicare Other | Admitting: Adult Health

## 2016-06-26 ENCOUNTER — Encounter: Payer: Self-pay | Admitting: Adult Health

## 2016-06-26 VITALS — BP 124/70 | Temp 98.5°F | Ht 59.0 in | Wt 158.8 lb

## 2016-06-26 DIAGNOSIS — J0140 Acute pansinusitis, unspecified: Secondary | ICD-10-CM | POA: Diagnosis not present

## 2016-06-26 MED ORDER — BENZONATATE 100 MG PO CAPS: 100.0000 mg | ORAL_CAPSULE | Freq: Two times a day (BID) | ORAL | 0 refills | Status: DC | PRN

## 2016-06-26 MED ORDER — DOXYCYCLINE HYCLATE 100 MG PO CAPS: 100.0000 mg | ORAL_CAPSULE | Freq: Two times a day (BID) | ORAL | 0 refills | Status: DC

## 2016-06-26 NOTE — Progress Notes (Signed)
Subjective:    Patient ID: Ashley Lawson, female    DOB: 1933-02-25, 83 y.o.   MRN: [id number]  Sinusitis  This is a new problem. The current episode started in the past 7 days. The problem has been gradually worsening since onset. There has been no fever (subjective low grade). Associated symptoms include congestion, coughing (dry ), headaches, sinus pressure and a sore throat. Pertinent negatives include no ear pain or shortness of breath. Past treatments include oral decongestants (mucinex). The treatment provided no relief.      Review of Systems  Constitutional: Positive for fever. Negative for fatigue.  HENT: Positive for congestion, sinus pain, sinus pressure and sore throat. Negative for ear pain, facial swelling and trouble swallowing.   Respiratory: Positive for cough (dry ). Negative for apnea, shortness of breath and wheezing.   Cardiovascular: Negative.   Neurological: Positive for headaches.   Past Medical History:  Diagnosis Date  . Abdominal pain 12/2009   hospitalized  . Allergic rhinitis   . Diverticulosis of colon   . Dog bite(E906.0) 08/30/2011  . GERD (gastroesophageal reflux disease)   . Hepatic steatosis   . Hyperlipidemia   . Hyperplastic colon polyp   . IBS (irritable bowel syndrome)    constipation predominant  . Internal hemorrhoids   . Osteoporosis    dexa 2011 -2.7 hip nl spine  . PMR (polymyalgia rheumatica) (HCC) 08/30/2011   under rheum care  on low dose pred 5 mg   . Polymyalgia rheumatica (HCC)     Social History   Social History  . Marital status: Widowed    Spouse name: N/A  . Number of children: N/A  . Years of education: N/A   Occupational History  . retired    Social History Main Topics  . Smoking status: Never Smoker  . Smokeless tobacco: Never Used  . Alcohol use 0.0 oz/week     Comment: occ  . Drug use: No  . Sexual activity: Not on file   Other Topics Concern  . Not on file   Social History Narrative   Retired   Regular exercise- yes   Widowed   At home with children and GKs    HH of 2     Puppy   Plays cards is social and active     Past Surgical History:  Procedure Laterality Date  . ABDOMINAL HYSTERECTOMY    . CATARACT EXTRACTION  2010   both  . COLONOSCOPY  2016 was most recent    Family History  Problem Relation Age of Onset  . Other Mother     fx hip  . Other Sister     fx hip  . Bladder Cancer Brother   . Colon cancer Neg Hx   . Esophageal cancer Neg Hx   . Stomach cancer Neg Hx   . Rectal cancer Neg Hx   . Liver cancer Neg Hx     Allergies  Allergen Reactions  . Alendronate Sodium     upset stomach  . Amoxicillin Rash    Has patient had a PCN reaction causing immediate rash, facial/tongue/throat swelling, SOB or lightheadedness with hypotension: yes Has patient had a PCN reaction causing severe rash involving mucus membranes or skin necrosis: no Has patient had a PCN reaction that required hospitalization: no Has patient had PCN reaction within the last 10 years: no  If all of the above answers are "NO", then may proceed with Cephalosporin use.   . Flagyl [Metronidazole]  Other (See Comments)    Pounding headaches patient says was bad.  With cipro for diverticulitis rx.    Current Outpatient Prescriptions on File Prior to Visit  Medication Sig Dispense Refill  . acetaminophen (TYLENOL) 500 MG tablet Take 500 mg by mouth every 6 (six) hours as needed for mild pain, moderate pain or headache.    . ALPRAZolam (XANAX) 0.25 MG tablet Take 0.25 mg by mouth at bedtime as needed for sleep.  0  . Ascorbic Acid (VITAMIN C) 100 MG tablet Take 100 mg by mouth daily.      . calcium carbonate (OS-CAL) 1250 (500 Ca) MG chewable tablet Chew 1 tablet by mouth 2 (two) times daily.    . diazepam (VALIUM) 5 MG tablet Take 1 tablet (5 mg total) by mouth every 8 (eight) hours as needed for muscle spasms. 60 tablet 0  . diclofenac sodium (VOLTAREN) 1 % GEL Apply 4 g topically 4 (four)  times daily. 100 g 0  . diltiazem 2 % GEL Apply 1 application topically 2 (two) times daily. 30 g 1  . fish oil-omega-3 fatty acids 1000 MG capsule Take 1 g by mouth daily.     . fluticasone (FLONASE) 50 MCG/ACT nasal spray 2 spray each nostril qd (Patient taking differently: Place 2 sprays into both nostrils daily as needed for allergies. ) 16 g 3  . Loratadine 10 MG CAPS Take 1 capsule (10 mg total) by mouth daily. 30 each 2  . MULTIPLE VITAMIN PO Take 1 tablet by mouth daily.     . Probiotic Product (ALIGN PO) Take 1 tablet by mouth daily.    . RESTASIS 0.05 % ophthalmic emulsion Place 1 drop into both eyes 2 (two) times daily.  3  . tamsulosin (FLOMAX) 0.4 MG CAPS capsule Take 1 capsule (0.4 mg total) by mouth daily. For bladder spasm. 20 capsule 0  . ranitidine (ZANTAC) 150 MG tablet Take 1 tablet (150 mg total) by mouth 2 (two) times daily. 60 tablet 2   No current facility-administered medications on file prior to visit.     BP 124/70 (BP Location: Left Arm, Patient Position: Sitting, Cuff Size: Normal)   Temp 98.5 F (36.9 C) (Oral)   Ht 4' 11"  (1.499 m)   Wt 158 lb 12.8 oz (72 kg)   BMI 32.07 kg/m       Objective:   Physical Exam  Constitutional: She is oriented to person, place, and time. She appears well-developed and well-nourished. No distress.  HENT:  Head: Normocephalic and atraumatic.  Right Ear: Hearing, tympanic membrane, external ear and ear canal normal.  Left Ear: Hearing, tympanic membrane, external ear and ear canal normal.  Nose: Mucosal edema present. No rhinorrhea. Right sinus exhibits maxillary sinus tenderness and frontal sinus tenderness. Left sinus exhibits maxillary sinus tenderness and frontal sinus tenderness.  Mouth/Throat: Uvula is midline, oropharynx is clear and moist and mucous membranes are normal. No oropharyngeal exudate.  Eyes: Conjunctivae and EOM are normal. Pupils are equal, round, and reactive to light. Right eye exhibits no discharge.  Left eye exhibits no discharge. No scleral icterus.  Neck: Normal range of motion. Neck supple.  Cardiovascular: Normal rate, regular rhythm, normal heart sounds and intact distal pulses.  Exam reveals no gallop and no friction rub.   No murmur heard. Pulmonary/Chest: Effort normal and breath sounds normal. No respiratory distress. She has no wheezes. She has no rales. She exhibits no tenderness.  Lymphadenopathy:    She has no cervical  adenopathy.  Neurological: She is alert and oriented to person, place, and time.  Skin: Skin is warm and dry. No rash noted. She is not diaphoretic. No erythema. No pallor.  Psychiatric: She has a normal mood and affect. Her behavior is normal. Thought content normal.  Nursing note and vitals reviewed.     Assessment & Plan:  1. Acute non-recurrent pansinusitis - doxycycline (VIBRAMYCIN) 100 MG capsule; Take 1 capsule (100 mg total) by mouth 2 (two) times daily.  Dispense: 14 capsule; Refill: 0 - benzonatate (TESSALON) 100 MG capsule; Take 1 capsule (100 mg total) by mouth 2 (two) times daily as needed for cough.  Dispense: 20 capsule; Refill: 0 - Can continue with Mucinex - Stay hydrated - Follow up with PCP if symptoms do not resolve   Dorothyann Peng, NP

## 2016-07-14 DIAGNOSIS — Z13820 Encounter for screening for osteoporosis: Secondary | ICD-10-CM | POA: Diagnosis not present

## 2016-07-14 DIAGNOSIS — M899 Disorder of bone, unspecified: Secondary | ICD-10-CM | POA: Diagnosis not present

## 2016-07-14 LAB — HM DEXA SCAN

## 2016-07-19 ENCOUNTER — Encounter: Payer: Self-pay | Admitting: Internal Medicine

## 2016-07-19 ENCOUNTER — Ambulatory Visit (INDEPENDENT_AMBULATORY_CARE_PROVIDER_SITE_OTHER): Payer: Medicare Other | Admitting: Internal Medicine

## 2016-07-19 VITALS — BP 100/80 | HR 72 | Temp 98.1°F | Ht 59.0 in | Wt 163.6 lb

## 2016-07-19 DIAGNOSIS — J329 Chronic sinusitis, unspecified: Secondary | ICD-10-CM

## 2016-07-19 DIAGNOSIS — J309 Allergic rhinitis, unspecified: Secondary | ICD-10-CM

## 2016-07-19 DIAGNOSIS — Z79899 Other long term (current) drug therapy: Secondary | ICD-10-CM

## 2016-07-19 DIAGNOSIS — M1990 Unspecified osteoarthritis, unspecified site: Secondary | ICD-10-CM | POA: Diagnosis not present

## 2016-07-19 MED ORDER — LEVOFLOXACIN 750 MG PO TABS: 750.0000 mg | ORAL_TABLET | Freq: Every day | ORAL | 0 refills | Status: DC

## 2016-07-19 MED ORDER — DICLOFENAC SODIUM 1 % TD GEL: 4.0000 g | Freq: Four times a day (QID) | TRANSDERMAL | 3 refills | Status: DC

## 2016-07-19 MED ORDER — DICLOFENAC SODIUM 1 % TD GEL: 4.0000 g | Freq: Four times a day (QID) | TRANSDERMAL | 0 refills | Status: DC

## 2016-07-19 MED ORDER — PREDNISONE 20 MG PO TABS: 20.0000 mg | ORAL_TABLET | Freq: Two times a day (BID) | ORAL | 0 refills | Status: DC

## 2016-07-19 NOTE — Progress Notes (Signed)
Chief Complaint  Patient presents with  . Sinus Problem    HPI: Ashley Lawson 83 y.o.   SDA for "allergies and cough"  Had anatibiotoic  3 28  Doxy   For pansinsusitus   Some better but not gone and daughter told her to get rechecked        In am has to clear  tjhroat     righ tsided   Face and head pain and congestion   Inflammation   No fever chills  persisted clogged area irght  White drainage   Noclaritin and xyrtec   Sleepy using lfonase   Every day .    Tried saline  For   Sinus.    Partly treated .  Past hx of cancer in right nose a few years ago     ROS: See pertinent positives and negatives per HPI. No fever chills   Topical voltaren helps the  Joints.  Reports that bladder pain was prob from compression fx  Back  And now jut a bit sore  But better .  Rectal   Spasm pain  Does better with  Diltiazem gel per dr Carlean Purl    Past Medical History:  Diagnosis Date  . Abdominal pain 12/2009   hospitalized  . Allergic rhinitis   . Diverticulosis of colon   . Dog bite(E906.0) 08/30/2011  . GERD (gastroesophageal reflux disease)   . Hepatic steatosis   . Hyperlipidemia   . Hyperplastic colon polyp   . IBS (irritable bowel syndrome)    constipation predominant  . Internal hemorrhoids   . Osteoporosis    dexa 2011 -2.7 hip nl spine  . PMR (polymyalgia rheumatica) (HCC) 08/30/2011   under rheum care  on low dose pred 5 mg   . Polymyalgia rheumatica (HCC)     Family History  Problem Relation Age of Onset  . Other Mother     fx hip  . Other Sister     fx hip  . Bladder Cancer Brother   . Colon cancer Neg Hx   . Esophageal cancer Neg Hx   . Stomach cancer Neg Hx   . Rectal cancer Neg Hx   . Liver cancer Neg Hx     Social History   Social History  . Marital status: Widowed    Spouse name: N/A  . Number of children: N/A  . Years of education: N/A   Occupational History  . retired    Social History Main Topics  . Smoking status: Never Smoker  . Smokeless  tobacco: Never Used  . Alcohol use 0.0 oz/week     Comment: occ  . Drug use: No  . Sexual activity: Not Asked   Other Topics Concern  . None   Social History Narrative   Retired   Regular exercise- yes   Widowed   At home with children and GKs    HH of 2     Puppy   Plays cards is social and active     Outpatient Medications Prior to Visit  Medication Sig Dispense Refill  . acetaminophen (TYLENOL) 500 MG tablet Take 500 mg by mouth every 6 (six) hours as needed for mild pain, moderate pain or headache.    . ALPRAZolam (XANAX) 0.25 MG tablet Take 0.25 mg by mouth at bedtime as needed for sleep.  0  . Ascorbic Acid (VITAMIN C) 100 MG tablet Take 100 mg by mouth daily.      . calcium carbonate (  OS-CAL) 1250 (500 Ca) MG chewable tablet Chew 1 tablet by mouth 2 (two) times daily.    Marland Kitchen diltiazem 2 % GEL Apply 1 application topically 2 (two) times daily. 30 g 1  . fish oil-omega-3 fatty acids 1000 MG capsule Take 1 g by mouth daily.     . fluticasone (FLONASE) 50 MCG/ACT nasal spray 2 spray each nostril qd (Patient taking differently: Place 2 sprays into both nostrils daily as needed for allergies. ) 16 g 3  . Loratadine 10 MG CAPS Take 1 capsule (10 mg total) by mouth daily. 30 each 2  . MULTIPLE VITAMIN PO Take 1 tablet by mouth daily.     . Probiotic Product (ALIGN PO) Take 1 tablet by mouth daily.    . RESTASIS 0.05 % ophthalmic emulsion Place 1 drop into both eyes 2 (two) times daily.  3  . benzonatate (TESSALON) 100 MG capsule Take 1 capsule (100 mg total) by mouth 2 (two) times daily as needed for cough. 20 capsule 0  . diclofenac sodium (VOLTAREN) 1 % GEL Apply 4 g topically 4 (four) times daily. 100 g 0  . doxycycline (VIBRAMYCIN) 100 MG capsule Take 1 capsule (100 mg total) by mouth 2 (two) times daily. 14 capsule 0  . ranitidine (ZANTAC) 150 MG tablet Take 1 tablet (150 mg total) by mouth 2 (two) times daily. 60 tablet 2  . tamsulosin (FLOMAX) 0.4 MG CAPS capsule Take 1  capsule (0.4 mg total) by mouth daily. For bladder spasm. (Patient not taking: Reported on 07/19/2016) 20 capsule 0  . diazepam (VALIUM) 5 MG tablet Take 1 tablet (5 mg total) by mouth every 8 (eight) hours as needed for muscle spasms. (Patient not taking: Reported on 07/19/2016) 60 tablet 0   No facility-administered medications prior to visit.      EXAM:  BP 100/80 (BP Location: Right Arm, Patient Position: Sitting, Cuff Size: Normal)   Pulse 72   Temp 98.1 F (36.7 C) (Oral)   Ht 4' 11"  (1.499 m)   Wt 163 lb 9.6 oz (74.2 kg)   SpO2 95%   BMI 33.04 kg/m   Body mass index is 33.04 kg/m.  GENERAL: vitals reviewed and listed above, alert, oriented, appears well hydrated and in no acute distress  Has throat clearing cough and congestion non toxic lools well other wise  HEENT: atraumatic, conjunctiva  clear, no obvious abnormalities on inspection of external nose and ears  Congested face tender right frontal  And some max   tmx clear  OP : no lesion edema or exudate  NECK: no obvious masses on inspection palpation  LUNGS: clear to auscultation bilaterally, no wheezes, rales or rhonchi, good air movement CV: HRRR, no clubbing cyanosis or  peripheral edema nl cap refill  MS: moves all extremities without noticeable focal  Abnormality  Has arthritic changes noted  PSYCH: pleasant and cooperative, no obvious depression o cognition intact although couldn't remember her ent surgeon from a few years ago  Looks well with nl speech  Wt Readings from Last 3 Encounters:  07/19/16 163 lb 9.6 oz (74.2 kg)  06/26/16 158 lb 12.8 oz (72 kg)  06/20/16 161 lb (73 kg)   BP Readings from Last 3 Encounters:  07/19/16 100/80  06/26/16 124/70  06/20/16 138/70    ASSESSMENT AND PLAN:  Discussed the following assessment and plan:  Chronic recurrent sinusitis - localized right   Allergic rhinitis, unspecified seasonality, unspecified trigger  Medication management  Arthritis - helped by  topical  voltaren I f    persistent or progressive may need to see ent  Antibiotic risk benefit   All pcn rash ? Swelling  not  And thus avoiding cephalo group for now -Patient advised to return or notify health care team  if symptoms worsen ,persist or new concerns arise.  Patient Instructions  This acts like a partly treated sinusitis.   Begin broader spectrum antibiotic in 3-5 days of prednisone to decrease the swelling. In the sinus area   Continue saline and your Flonase and antihistamine of your choice  If you are not improved at the end of the medicine contact us for reevaluation.   The next step might be having an ear nose and throat doctor evaluating..   Your chest exam is good today.       Standley Brooking. [place] Police M.D.

## 2016-07-19 NOTE — Patient Instructions (Addendum)
This acts like a partly treated sinusitis.   Begin broader spectrum antibiotic in 3-5 days of prednisone to decrease the swelling. In the sinus area   Continue saline and your Flonase and antihistamine of your choice  If you are not improved at the end of the medicine contact us for reevaluation.   The next step might be having an ear nose and throat doctor evaluating..   Your chest exam is good today.

## 2016-07-22 ENCOUNTER — Telehealth: Payer: Self-pay

## 2016-07-22 NOTE — Telephone Encounter (Signed)
Received PA request for Voltaren from [facility]. PA approved & form faxed back to pharmacy.

## 2016-07-24 ENCOUNTER — Telehealth: Payer: Self-pay | Admitting: Internal Medicine

## 2016-07-24 NOTE — Telephone Encounter (Signed)
Pt need new Rx for alprazolam 0.25 MG Pharm:  [facility].  Pt is familiar with this medication it really help relax her so that she can sleep and state that she will not take it daily only when she feel anxiety coming on or if there is another medication that Dr. Regis Bill would give to her to help her relax/sleep

## 2016-07-24 NOTE — Telephone Encounter (Signed)
Please advise 

## 2016-07-25 NOTE — Telephone Encounter (Signed)
For sleep it would be best to use a different medication  . Can try trazodone 50 mg Take 25-50 mg at night as needed for sleep. Dispense 30 Take it right before bed will make you drowsy. Use for a fe3-5 nights in a   Row to see how helps

## 2016-07-26 NOTE — Telephone Encounter (Signed)
Pt states that she doesn't feel she needs this medication anymore, she feels she is sleeping a little better now. She states that she gets up frequently through the night and is afraid if she takes medication she may fall trying to get up and use the BR. Advise pt that if she changes her mind to give the office a call back.

## 2016-08-07 DIAGNOSIS — R05 Cough: Secondary | ICD-10-CM | POA: Diagnosis not present

## 2016-08-07 DIAGNOSIS — J0140 Acute pansinusitis, unspecified: Secondary | ICD-10-CM | POA: Diagnosis not present

## 2016-08-14 ENCOUNTER — Telehealth: Payer: Self-pay

## 2016-08-14 NOTE — Telephone Encounter (Signed)
Dr. Regis Bill, just FYI   Ms. Aldaba was on [facility] list for fup dexa secondary to fx. Did not note in the record  Call to Ms. Gilliam;  Stated she was found to have a L4 compression fx 3 to 4 months ago and tx by Dr. Stevenson Clinch, whom she fup with on 06/25  States "someone" came out and checked her "heel" and told her she was fine.  She is also seeing Dr. McDiarmid who is not sure why her Bladder spasms are occurring.  Continues to have pain when urinating; interfering with her daily life.  Agrees to come in Friday (5/18 at 2:15)  to discuss possible bone density due to fx and fup on urinary issues; a fup apt with Dr. McDiarmid is planned for next we

## 2016-08-16 ENCOUNTER — Ambulatory Visit (INDEPENDENT_AMBULATORY_CARE_PROVIDER_SITE_OTHER): Payer: Medicare Other | Admitting: Internal Medicine

## 2016-08-16 ENCOUNTER — Encounter: Payer: Self-pay | Admitting: Internal Medicine

## 2016-08-16 VITALS — BP 124/80 | HR 82 | Temp 97.8°F | Ht 59.0 in | Wt 160.2 lb

## 2016-08-16 DIAGNOSIS — M810 Age-related osteoporosis without current pathological fracture: Secondary | ICD-10-CM | POA: Diagnosis not present

## 2016-08-16 DIAGNOSIS — G8929 Other chronic pain: Secondary | ICD-10-CM

## 2016-08-16 DIAGNOSIS — R109 Unspecified abdominal pain: Secondary | ICD-10-CM

## 2016-08-16 DIAGNOSIS — Z79899 Other long term (current) drug therapy: Secondary | ICD-10-CM | POA: Diagnosis not present

## 2016-08-16 DIAGNOSIS — R1032 Left lower quadrant pain: Secondary | ICD-10-CM | POA: Diagnosis not present

## 2016-08-16 DIAGNOSIS — R350 Frequency of micturition: Secondary | ICD-10-CM

## 2016-08-16 DIAGNOSIS — Z87440 Personal history of urinary (tract) infections: Secondary | ICD-10-CM | POA: Diagnosis not present

## 2016-08-16 LAB — POC URINALSYSI DIPSTICK (AUTOMATED)
Bilirubin, UA: NEGATIVE
Blood, UA: POSITIVE
GLUCOSE UA: NEGATIVE
Ketones, UA: NEGATIVE
LEUKOCYTES UA: NEGATIVE
NITRITE UA: NEGATIVE
PROTEIN UA: NEGATIVE
SPEC GRAV UA: 1.02 (ref 1.010–1.025)
UROBILINOGEN UA: 0.2 U/dL
pH, UA: 6 (ref 5.0–8.0)

## 2016-08-16 LAB — BASIC METABOLIC PANEL
BUN: 11 mg/dL (ref 6–23)
CHLORIDE: 96 meq/L (ref 96–112)
CO2: 25 mEq/L (ref 19–32)
CREATININE: 0.81 mg/dL (ref 0.40–1.20)
Calcium: 9.6 mg/dL (ref 8.4–10.5)
GFR: 71.92 mL/min (ref >60.00)
Glucose, Bld: 80 mg/dL (ref 70–99)
POTASSIUM: 4.4 meq/L (ref 3.5–5.1)
SODIUM: 130 meq/L — AB (ref 135–145)

## 2016-08-16 LAB — VITAMIN D 25 HYDROXY (VIT D DEFICIENCY, FRACTURES): VITD: 76.24 ng/mL (ref 30.00–100.00)

## 2016-08-16 NOTE — Progress Notes (Signed)
Chief Complaint  Patient presents with  . Follow-up    HPI: Ashley Lawson 83 y.o. come in requetsed to ge dexa   Since she had a mild compression  feractur of her spine about 4 mos ago  Getting better Hurts  when stands a long time.  To seeing  [name] She is still battling lower abd sx   And has seen urology Apparently has been on doxy and  pred and saline  xyzal  Zyrtec for allergies and sinus infection  To see  uro  Next week.  I s disconcerted that she still has pain without a dx cause tec  woreis and has  Nocturia  And problesm with boid when upright  Not taking ativan on reg basis  Dr Cecilie Kicks.   Is Gi cdoc .   ROS: See pertinent positives and negatives per HPI. No current fever  Falling   Past Medical History:  Diagnosis Date  . Abdominal pain 12/2009   hospitalized  . Allergic rhinitis   . Diverticulosis of colon   . Dog bite(E906.0) 08/30/2011  . GERD (gastroesophageal reflux disease)   . Hepatic steatosis   . Hyperlipidemia   . Hyperplastic colon polyp   . IBS (irritable bowel syndrome)    constipation predominant  . Internal hemorrhoids   . Osteoporosis    dexa 2011 -2.7 hip nl spine  . PMR (polymyalgia rheumatica) (HCC) 08/30/2011   under rheum care  on low dose pred 5 mg   . Polymyalgia rheumatica (HCC)     Family History  Problem Relation Age of Onset  . Other Mother        fx hip  . Other Sister        fx hip  . Bladder Cancer Brother   . Colon cancer Neg Hx   . Esophageal cancer Neg Hx   . Stomach cancer Neg Hx   . Rectal cancer Neg Hx   . Liver cancer Neg Hx     Social History   Social History  . Marital status: Widowed    Spouse name: N/A  . Number of children: N/A  . Years of education: N/A   Occupational History  . retired    Social History Main Topics  . Smoking status: Never Smoker  . Smokeless tobacco: Never Used  . Alcohol use 0.0 oz/week     Comment: occ  . Drug use: No  . Sexual activity: Not Asked   Other Topics Concern  .  None   Social History Narrative   Retired   Regular exercise- yes   Widowed   At home with children and GKs    HH of 2     Puppy   Plays cards is social and active     Outpatient Medications Prior to Visit  Medication Sig Dispense Refill  . acetaminophen (TYLENOL) 500 MG tablet Take 500 mg by mouth every 6 (six) hours as needed for mild pain, moderate pain or headache.    . Ascorbic Acid (VITAMIN C) 100 MG tablet Take 100 mg by mouth daily.      . calcium carbonate (OS-CAL) 1250 (500 Ca) MG chewable tablet Chew 1 tablet by mouth 2 (two) times daily.    . diclofenac sodium (VOLTAREN) 1 % GEL Apply 4 g topically 4 (four) times daily. 100 g 3  . diltiazem 2 % GEL Apply 1 application topically 2 (two) times daily. 30 g 1  . fish oil-omega-3 fatty acids 1000 MG  capsule Take 1 g by mouth daily.     . fluticasone (FLONASE) 50 MCG/ACT nasal spray 2 spray each nostril qd (Patient taking differently: Place 2 sprays into both nostrils daily as needed for allergies. ) 16 g 3  . levofloxacin (LEVAQUIN) 750 MG tablet Take 1 tablet (750 mg total) by mouth daily. 7 tablet 0  . Loratadine 10 MG CAPS Take 1 capsule (10 mg total) by mouth daily. 30 each 2  . MULTIPLE VITAMIN PO Take 1 tablet by mouth daily.     . Probiotic Product (ALIGN PO) Take 1 tablet by mouth daily.    . RESTASIS 0.05 % ophthalmic emulsion Place 1 drop into both eyes 2 (two) times daily.  3  . predniSONE (DELTASONE) 20 MG tablet Take 1 tablet (20 mg total) by mouth 2 (two) times daily with a meal. 10 tablet 0  . ALPRAZolam (XANAX) 0.25 MG tablet Take 0.25 mg by mouth at bedtime as needed for sleep.  0  . ranitidine (ZANTAC) 150 MG tablet Take 1 tablet (150 mg total) by mouth 2 (two) times daily. 60 tablet 2  . tamsulosin (FLOMAX) 0.4 MG CAPS capsule Take 1 capsule (0.4 mg total) by mouth daily. For bladder spasm. (Patient not taking: Reported on 08/16/2016) 20 capsule 0   No facility-administered medications prior to visit.       EXAM:  BP 124/80 (BP Location: Right Arm, Patient Position: Sitting, Cuff Size: Normal)   Pulse 82   Temp 97.8 F (36.6 C) (Oral)   Ht 4' 11"  (1.499 m)   Wt 160 lb 3.2 oz (72.7 kg)   BMI 32.36 kg/m   Body mass index is 32.36 kg/m.  GENERAL: vitals reviewed and listed above, alert, oriented, appears well hydrated and in no acute distress holds left groin abd area  HEENT: atraumatic, conjunctiva  clear, no obvious abnormalities on inspection of external nose and ears NECK: no obvious masses on inspection palpation  LUNGS: clear to auscultation bilaterally, no wheezes, rales or rhonchi, CV: HRRR, no clubbing cyanosis or  peripheral edema nl cap refill  abd  Sore llq pelivc area   No g rebound   MS: moves all extremities without noticeable focal  abnormality PSYCH: pleasant and cooperative,   Baseline anxiety   Worried about  Pain  Lab Results  Component Value Date   WBC 6.4 04/19/2016   HGB 13.6 04/19/2016   HCT 38.0 04/19/2016   PLT 251 04/19/2016   GLUCOSE 80 08/16/2016   CHOL 203 (H) 01/22/2016   TRIG 126.0 01/22/2016   HDL 67.80 01/22/2016   LDLDIRECT 125.8 02/23/2013   LDLCALC 110 (H) 01/22/2016   ALT 25 04/04/2016   AST 28 04/04/2016   NA 130 (L) 08/16/2016   K 4.4 08/16/2016   CL 96 08/16/2016   CREATININE 0.81 08/16/2016   BUN 11 08/16/2016   CO2 25 08/16/2016   TSH 4.18 01/22/2016   BP Readings from Last 3 Encounters:  08/16/16 124/80  07/19/16 100/80  06/26/16 124/70    ASSESSMENT AND PLAN:  Discussed the following assessment and plan:  Osteoporosis, unspecified osteoporosis type, unspecified pathological fracture presence - Plan: VITAMIN D 25 Hydroxy (Vit-D Deficiency, Fractures), PTH, intact and calcium, Basic metabolic panel, DG Bone Density  Urine frequency - Plan: POCT Urinalysis Dipstick (Automated), VITAMIN D 25 Hydroxy (Vit-D Deficiency, Fractures), PTH, intact and calcium, Basic metabolic panel  Chronic LLQ pain  History of  UTI  Abdominal pain, unspecified abdominal location  Medication management -  wou bd limimt  risk meds such as benzos and antihistamines you have osteoporosis cause you had a fracture  In your back. You should have a vit d level  And othre eval  To make sure not adding the the probel  You can chooses to do prolia   injrcion to help dec risk of fufuterfracure  The other meds are forteo   Anxiety about sx  Disc focusing on ? fro dr  D  .  Urology. Other options about pain .  Lab today  And plan fu depending on results  Of dexa . Just got [facility]  ? dosy  Total visit 87mns > 50% spent counseling and coordinating care as indicated in above note and in instructions to patient .    reagarding  Concerns about abd pain   Bladder sx and overal fatigue tec .   -Patient advised to return or notify health care team  if  new concerns arise.  Patient Instructions  Get   appt for bone density   And plan  Fu after result.  Is in .   need to do a vitamin d level and other  Depending on results .   consideration of forteo   prolio   Other meds for bone health . Make a list of ?s  For urologist.  Explanation for pain .? Pattern  Of  urinary frequency at night a problem in day . ?  Avoid  Medications that  Cause drowsiness unless in acute situation.        WStandley Brooking Ashley Lawson M.D.

## 2016-08-16 NOTE — Patient Instructions (Addendum)
Get   appt for bone density   And plan  Fu after result.  Is in .   need to do a vitamin d level and other  Depending on results .   consideration of forteo   prolio   Other meds for bone health . Make a list of ?s  For urologist.  Explanation for pain .? Pattern  Of  urinary frequency at night a problem in day . ?  Avoid  Medications that  Cause drowsiness unless in acute situation.

## 2016-08-19 LAB — PTH, INTACT AND CALCIUM
CALCIUM: 9.5 mg/dL (ref 8.6–10.4)
PTH: 26 pg/mL (ref 14–64)

## 2016-08-21 ENCOUNTER — Other Ambulatory Visit: Payer: Self-pay | Admitting: Internal Medicine

## 2016-08-21 DIAGNOSIS — Z1231 Encounter for screening mammogram for malignant neoplasm of breast: Secondary | ICD-10-CM

## 2016-08-22 DIAGNOSIS — N3020 Other chronic cystitis without hematuria: Secondary | ICD-10-CM | POA: Diagnosis not present

## 2016-08-23 ENCOUNTER — Encounter: Payer: Self-pay | Admitting: Family Medicine

## 2016-08-23 ENCOUNTER — Ambulatory Visit (INDEPENDENT_AMBULATORY_CARE_PROVIDER_SITE_OTHER)
Admission: RE | Admit: 2016-08-23 | Discharge: 2016-08-23 | Disposition: A | Discharge status: Home / Self Care | Payer: Medicare Other | Source: Ambulatory Visit | Attending: Internal Medicine | Admitting: Internal Medicine

## 2016-08-23 DIAGNOSIS — M810 Age-related osteoporosis without current pathological fracture: Secondary | ICD-10-CM

## 2016-08-27 ENCOUNTER — Encounter: Payer: Self-pay | Admitting: Family Medicine

## 2016-09-02 ENCOUNTER — Encounter: Payer: Self-pay | Admitting: Internal Medicine

## 2016-09-02 ENCOUNTER — Ambulatory Visit (INDEPENDENT_AMBULATORY_CARE_PROVIDER_SITE_OTHER): Payer: Medicare Other | Admitting: Internal Medicine

## 2016-09-02 VITALS — BP 138/78 | HR 68 | Ht 59.0 in | Wt 160.0 lb

## 2016-09-02 DIAGNOSIS — K588 Other irritable bowel syndrome: Secondary | ICD-10-CM

## 2016-09-02 DIAGNOSIS — S32040D Wedge compression fracture of fourth lumbar vertebra, subsequent encounter for fracture with routine healing: Secondary | ICD-10-CM

## 2016-09-02 DIAGNOSIS — R300 Dysuria: Secondary | ICD-10-CM | POA: Diagnosis not present

## 2016-09-02 DIAGNOSIS — K594 Anal spasm: Secondary | ICD-10-CM

## 2016-09-02 NOTE — Patient Instructions (Signed)
  Dr Carlean Purl is going to talk with Dr Matilde Sprang and we will be back in touch.    Stop your diltiazem per Dr Carlean Purl.     I appreciate the opportunity to care for you. Silvano Rusk, MD, [facility]

## 2016-09-02 NOTE — Progress Notes (Signed)
   Ashley Lawson 83 y.o. Dec 07, 1932 [id number]  Assessment & Plan:   Encounter Diagnoses  Name Primary?  . irritable bowel syndrome Yes  . Anal sphincter spasm   . Closed compression fracture of L4 lumbar vertebra with routine healing, subsequent encounter   . Dysuria     Overall better but still w/ dysuria sxs and pain w/ mvt in pubic area ? Pelvic floor PT - pelvic floor laxity on MRI early 2018 will query urology Stay on estrogen Stay on laxatives  Stop diltiazem gel now See me prn  Subjective:   Chief Complaint: abdomional pain, constipation f/u  HPI Last seen 3/22 dx anal sphincter spasm Tx diltiazem gel, ? Component of spinal issues  Better - moving bowels w/ MiraLax and Prune juice Premarin cream Rx from [facility] - helps buring suprapubic pain with urination but not all gone and still has pain with movement there Due to see Dr. [name] in f/u soon re: L-s spine/disc dz Frustrated over sxs interfering with activity - had been very active and her health problems are preventing her from doing all she used to  Medications, allergies, past medical history, past surgical history, family history and social history are reviewed and updated in the EMR.  Review of Systems  As above Objective:   Physical Exam BP 138/78   Pulse 68   Ht 4' 11"  (1.499 m)   Wt 160 lb (72.6 kg)   BMI 32.32 kg/m  NAD  Reviewed 05/2016 and 05/2016 GU notes

## 2016-09-05 ENCOUNTER — Ambulatory Visit
Admission: RE | Admit: 2016-09-05 | Discharge: 2016-09-05 | Disposition: A | Discharge status: Home / Self Care | Payer: Medicare Other | Source: Ambulatory Visit | Attending: Internal Medicine | Admitting: Internal Medicine

## 2016-09-05 DIAGNOSIS — Z1231 Encounter for screening mammogram for malignant neoplasm of breast: Secondary | ICD-10-CM

## 2016-09-06 DIAGNOSIS — Z961 Presence of intraocular lens: Secondary | ICD-10-CM | POA: Diagnosis not present

## 2016-09-06 DIAGNOSIS — H5213 Myopia, bilateral: Secondary | ICD-10-CM | POA: Diagnosis not present

## 2016-09-07 ENCOUNTER — Encounter: Payer: Self-pay | Admitting: Internal Medicine

## 2016-09-09 ENCOUNTER — Other Ambulatory Visit: Payer: Medicare Other

## 2016-09-24 DIAGNOSIS — R030 Elevated blood-pressure reading, without diagnosis of hypertension: Secondary | ICD-10-CM | POA: Diagnosis not present

## 2016-09-25 ENCOUNTER — Telehealth: Payer: Self-pay | Admitting: Internal Medicine

## 2016-09-25 NOTE — Telephone Encounter (Signed)
° ° ° °  Pt would to have her bone density numbers because she is trying to determine if she is going to take the injections or not

## 2016-10-03 NOTE — Telephone Encounter (Signed)
Reviewed results with patient.  Copy mailed to home address per patient request.

## 2016-10-08 DIAGNOSIS — M5137 Other intervertebral disc degeneration, lumbosacral region: Secondary | ICD-10-CM | POA: Diagnosis not present

## 2016-10-08 DIAGNOSIS — M5416 Radiculopathy, lumbar region: Secondary | ICD-10-CM | POA: Diagnosis not present

## 2016-10-08 DIAGNOSIS — M5136 Other intervertebral disc degeneration, lumbar region: Secondary | ICD-10-CM | POA: Diagnosis not present

## 2016-10-08 DIAGNOSIS — M4807 Spinal stenosis, lumbosacral region: Secondary | ICD-10-CM | POA: Diagnosis not present

## 2016-10-09 DIAGNOSIS — M5416 Radiculopathy, lumbar region: Secondary | ICD-10-CM | POA: Diagnosis not present

## 2016-10-09 DIAGNOSIS — M4807 Spinal stenosis, lumbosacral region: Secondary | ICD-10-CM | POA: Diagnosis not present

## 2016-11-05 DIAGNOSIS — D0461 Carcinoma in situ of skin of right upper limb, including shoulder: Secondary | ICD-10-CM | POA: Diagnosis not present

## 2016-11-05 DIAGNOSIS — L84 Corns and callosities: Secondary | ICD-10-CM | POA: Diagnosis not present

## 2016-11-05 DIAGNOSIS — L821 Other seborrheic keratosis: Secondary | ICD-10-CM | POA: Diagnosis not present

## 2016-11-05 DIAGNOSIS — L570 Actinic keratosis: Secondary | ICD-10-CM | POA: Diagnosis not present

## 2016-11-05 DIAGNOSIS — Z85828 Personal history of other malignant neoplasm of skin: Secondary | ICD-10-CM | POA: Diagnosis not present

## 2016-11-22 DIAGNOSIS — M79642 Pain in left hand: Secondary | ICD-10-CM | POA: Diagnosis not present

## 2016-11-22 DIAGNOSIS — S61402A Unspecified open wound of left hand, initial encounter: Secondary | ICD-10-CM | POA: Diagnosis not present

## 2016-12-23 DIAGNOSIS — N3020 Other chronic cystitis without hematuria: Secondary | ICD-10-CM | POA: Diagnosis not present

## 2017-01-22 DIAGNOSIS — R3982 Chronic bladder pain: Secondary | ICD-10-CM | POA: Diagnosis not present

## 2017-01-24 ENCOUNTER — Ambulatory Visit (INDEPENDENT_AMBULATORY_CARE_PROVIDER_SITE_OTHER): Payer: Medicare Other | Admitting: Internal Medicine

## 2017-01-24 ENCOUNTER — Encounter: Payer: Self-pay | Admitting: Internal Medicine

## 2017-01-24 VITALS — BP 138/78 | HR 95 | Temp 97.9°F | Wt 165.6 lb

## 2017-01-24 DIAGNOSIS — J329 Chronic sinusitis, unspecified: Secondary | ICD-10-CM

## 2017-01-24 DIAGNOSIS — J019 Acute sinusitis, unspecified: Secondary | ICD-10-CM | POA: Diagnosis not present

## 2017-01-24 MED ORDER — DOXYCYCLINE HYCLATE 100 MG PO TABS: 100.0000 mg | ORAL_TABLET | Freq: Two times a day (BID) | ORAL | 0 refills | Status: DC

## 2017-01-24 NOTE — Progress Notes (Signed)
Chief Complaint  Patient presents with  . Sinus Problem    Yellow mucus, head congestion, ear stuffiness x throat clearing x 1 week. Pt states that she at times will cough up yellow mucus - from PND. Using Mucinex daily. Denies fever.     HPI: Ashley Lawson 83 y.o.  sda    For sinus problem she has a history of recurrent chronic sinusitis.  Last seen in our system for this in the spring 2018.  About 10 days ago or so she had some congestion noted and some right-sided tenderness which she gets when she gets a sinus infection her ears are clogged and feel uncomfortable with pain and pressure.  No fever she used saline and got large chunks of bloody yellow mucus.  Slight better but still has persistent right sided tenderness.  Daughter wanted her to get seen before got worse. She states she has a history of some type of right-sided sinus surgery that was done in the office is always been on the right side. No fever vision changes has a history of allergy shots and allergy these problems do seem to get worse in the fall.  She is on her nasal cortisone.  ROS: See pertinent positives and negatives per HPI.  Seeing urology for intermittent recurrent UTI and bladder dysfunction.  Was recently put on Pyridium.  Past Medical History:  Diagnosis Date  . Abdominal pain 12/2009   hospitalized  . Allergic rhinitis   . Chronic constipation   . Compression fx, lumbar spine ([place])   . DDD (degenerative disc disease), lumbar   . Diverticulosis of colon   . Dog bite(E906.0) 08/30/2011  . GERD (gastroesophageal reflux disease)   . Hepatic steatosis   . Hyperlipidemia   . Hyperplastic colon polyp   . IBS (irritable bowel syndrome)    constipation predominant  . Internal hemorrhoids   . Osteoporosis    dexa 2011 -2.7 hip nl spine  . PMR (polymyalgia rheumatica) (HCC) 08/30/2011   under rheum care  on low dose pred 5 mg   . Polymyalgia rheumatica (HCC)     Family History  Problem Relation Age of  Onset  . Other Mother        fx hip  . Other Sister        fx hip  . Bladder Cancer Brother   . Colon cancer Neg Hx   . Esophageal cancer Neg Hx   . Stomach cancer Neg Hx   . Rectal cancer Neg Hx   . Liver cancer Neg Hx     Social History   Social History  . Marital status: Widowed    Spouse name: N/A  . Number of children: N/A  . Years of education: N/A   Occupational History  . retired    Social History Main Topics  . Smoking status: Never Smoker  . Smokeless tobacco: Never Used  . Alcohol use 0.0 oz/week     Comment: occ  . Drug use: No  . Sexual activity: Not Asked   Other Topics Concern  . None   Social History Narrative   Retired   Regular exercise- yes   Widowed   At home with children and GKs    HH of 2     Puppy   Plays cards is social and active     Outpatient Medications Prior to Visit  Medication Sig Dispense Refill  . acetaminophen (TYLENOL) 500 MG tablet Take 500 mg by mouth every 6 (  six) hours as needed for mild pain, moderate pain or headache.    . Ascorbic Acid (VITAMIN C) 100 MG tablet Take 100 mg by mouth daily.      . calcium carbonate (OS-CAL) 1250 (500 Ca) MG chewable tablet Chew 1 tablet by mouth 2 (two) times daily.    . fish oil-omega-3 fatty acids 1000 MG capsule Take 1 g by mouth daily.     . fluticasone (FLONASE) 50 MCG/ACT nasal spray 2 spray each nostril qd (Patient taking differently: Place 2 sprays into both nostrils daily as needed for allergies. ) 16 g 3  . MULTIPLE VITAMIN PO Take 1 tablet by mouth daily.     . Probiotic Product (ALIGN PO) Take 1 tablet by mouth daily.    . RESTASIS 0.05 % ophthalmic emulsion Place 1 drop into both eyes 2 (two) times daily.  3  . benzonatate (TESSALON) 100 MG capsule TAKE 1 CAPSULE BY MOUTH THREE TIMES A DAY (*DO NOT BREAK CHEW, DISSOLVE, CUT, OR CRUSH*)  0  . diclofenac sodium (VOLTAREN) 1 % GEL Apply 4 g topically 4 (four) times daily. (Patient not taking: Reported on 01/24/2017) 100 g 3  .  Estrogens Conjugated (PREMARIN PO) Take by mouth. 3 TIMES A WEEK FOR 1 MONTH AND THEN ONCE WEEKLY    . Loratadine 10 MG CAPS Take 1 capsule (10 mg total) by mouth daily. (Patient not taking: Reported on 01/24/2017) 30 each 2  . tamsulosin (FLOMAX) 0.4 MG CAPS capsule Take 1 capsule (0.4 mg total) by mouth daily. For bladder spasm. (Patient not taking: Reported on 01/24/2017) 20 capsule 0   No facility-administered medications prior to visit.      EXAM:  BP 138/78 (BP Location: Left Arm, Patient Position: Sitting, Cuff Size: Normal)   Pulse 95   Temp 97.9 F (36.6 C) (Oral)   Wt 165 lb 9.6 oz (75.1 kg)   BMI 33.45 kg/m   Body mass index is 33.45 kg/m. WDWN in NAD  quiet respirations; mildly congested  somewhat hoarse. Non toxic . HEENT: Normocephalic ;atraumatic , Eyes;  PERRL, EOMs  Full, lids and conjunctiva clear,,Ears: no deformities, canals nl, TM landmarks normal, Nose: no r discharge but quite congested;face right maxillary and frontal tender minimally tender on the left maxilla.  Mouth : OP clear without lesion or edema . Neck: Supple without adenopathy or masses or bruits Chest:  Clear to A   without wheezes rales or rhonchi CV:  S1-S2 no gallops or murmurs peripheral perfusion is normal Skin :nl perfusion and no acute rashes    ASSESSMENT AND PLAN:  Discussed the following assessment and plan:  Chronic recurrent sinusitis  Acute sinusitis with symptoms greater than 10 days Underlying chronic sinus symptoms seems to flare in the spring and fall has underlying allergy history. Today probably has a secondary sinusitis right based on history continue nasal sinus hygiene nasal cortisone and add empiric antibiotics. Consideration of adding Singulair in the fall and spring season in case allergies are contributing to her sinusitis flares. -Patient advised to return or notify health care team  if symptoms worsen ,persist or new concerns arise. Can schedule for preventive visit  in January. Patient Instructions  Continue nasal cortisone and nasal saline. This acts like an acute on chronic sinus problem. If recurrent and progressive consider seeing an ear nose and throat doctor again about your sinuses. Today we can add antibiotic again to see if it helps resolve it sooner.  Standley Brooking. Giada Schoppe M.D.

## 2017-01-24 NOTE — Patient Instructions (Addendum)
Continue nasal cortisone and nasal saline. This acts like an acute on chronic sinus problem. If recurrent and progressive consider seeing an ear nose and throat doctor again about your sinuses. Today we can add antibiotic again to see if it helps resolve it sooner.

## 2017-01-28 DIAGNOSIS — N3020 Other chronic cystitis without hematuria: Secondary | ICD-10-CM | POA: Diagnosis not present

## 2017-02-07 DIAGNOSIS — L97512 Non-pressure chronic ulcer of other part of right foot with fat layer exposed: Secondary | ICD-10-CM | POA: Diagnosis not present

## 2017-02-07 DIAGNOSIS — M2011 Hallux valgus (acquired), right foot: Secondary | ICD-10-CM | POA: Diagnosis not present

## 2017-02-08 ENCOUNTER — Other Ambulatory Visit: Payer: Self-pay

## 2017-02-08 ENCOUNTER — Encounter (HOSPITAL_COMMUNITY): Payer: Self-pay | Admitting: Emergency Medicine

## 2017-02-08 ENCOUNTER — Emergency Department (HOSPITAL_COMMUNITY)
Admission: EM | Admit: 2017-02-08 | Discharge: 2017-02-08 | Disposition: A | Discharge status: Home / Self Care | Payer: Medicare Other | Attending: Emergency Medicine | Admitting: Emergency Medicine

## 2017-02-08 DIAGNOSIS — Z79899 Other long term (current) drug therapy: Secondary | ICD-10-CM | POA: Insufficient documentation

## 2017-02-08 DIAGNOSIS — M79671 Pain in right foot: Secondary | ICD-10-CM | POA: Diagnosis not present

## 2017-02-08 MED ORDER — TRAMADOL HCL 50 MG PO TABS: 50.0000 mg | ORAL_TABLET | Freq: Four times a day (QID) | ORAL | 0 refills | Status: DC | PRN

## 2017-02-08 NOTE — ED Triage Notes (Signed)
Pt states she had corns removed from her great toe and the one next to it on her right foot yesterday by [facility]   Pt states last night she unwrapped her foot to soak it and she started having sharp spasm like pains in it  Pt states she took half of a tramadol and it helped a little but the pain has been off and on all night  Pt states the pain is not only where they did the surgery but in the entire foot

## 2017-02-08 NOTE — ED Provider Notes (Signed)
[facility] DEPT Provider Note   CSN: [id number] Arrival date & time: 02/08/17  0867     History   Chief Complaint Chief Complaint  Patient presents with  . Foot Pain    HPI Ashley Lawson is a 83 y.o. female who presents to the emergency department today for right foot pain.  Patient was seen by Dr. Abagail Kitchens yesterday and had corns removed on right great toe and second toe at [facility].  She has been following instructions on how to care for this at home with warm soaks in [place] salt and dressing changes but last night when try to fall asleep she started having sharp spasming pain in the areas where the corns were removed.  She took some of her daughter's tramadol for this which helped but the pain has been on and off.  She is worried that there is infection at this time.  She is unable to see her surgeon until Monday.  She denies any fever, chills, drainage, uncontrolled bleeding, surrounding erythema or inability to move the joints.  HPI  Past Medical History:  Diagnosis Date  . Abdominal pain 12/2009   hospitalized  . Allergic rhinitis   . Chronic constipation   . Compression fx, lumbar spine ([place])   . DDD (degenerative disc disease), lumbar   . Diverticulosis of colon   . Dog bite(E906.0) 08/30/2011  . GERD (gastroesophageal reflux disease)   . Hepatic steatosis   . Hyperlipidemia   . Hyperplastic colon polyp   . IBS (irritable bowel syndrome)    constipation predominant  . Internal hemorrhoids   . Osteoporosis    dexa 2011 -2.7 hip nl spine  . PMR (polymyalgia rheumatica) (HCC) 08/30/2011   under rheum care  on low dose pred 5 mg   . Polymyalgia rheumatica [facility])     Patient Active Problem List   Diagnosis Date Noted  . Rhinitis, allergic 05/31/2015  . Renal cyst incidental  01/19/2015  . Heartburn 01/14/2014  . Gastroesophageal reflux disease without esophagitis 01/14/2014  . Osteoporosis 01/14/2014  . IBS  (irritable bowel syndrome)   . Nail, ingrown 06/22/2012  . Arthritis 02/19/2012  . Screening, lipid 02/18/2011  . Rash and nonspecific skin eruption 02/18/2011  . Visit for preventive health examination 02/18/2011  . Skin cancer 10/14/2010  . Abdominal bloating 10/08/2010  . CONSTIPATION, CHRONIC 01/15/2010  . Lower urinary tract symptoms (LUTS) 01/15/2010  . SUPRAPUBIC PAIN 08/01/2009  . ABDOMINAL PAIN, EPIGASTRIC 06/16/2009  . DIVERTICULOSIS, COLON 06/07/2007  . Hyperlipidemia 06/05/2007  . ANXIETY STATE, UNSPECIFIED 06/05/2007  . Allergic rhinitis 06/05/2007  . IBS 06/05/2007  . OSTEOPOROSIS 06/05/2007  . ABDOMINAL PAIN, GENERALIZED 06/05/2007    Past Surgical History:  Procedure Laterality Date  . ABDOMINAL HYSTERECTOMY    . CATARACT EXTRACTION  2010   both  . COLONOSCOPY  2016 was most recent    OB History    No data available       Home Medications    Prior to Admission medications   Medication Sig Start Date End Date Taking? Authorizing Provider  acetaminophen (TYLENOL) 500 MG tablet Take 500 mg by mouth every 6 (six) hours as needed for mild pain, moderate pain or headache.    [provider]  Ascorbic Acid (VITAMIN C) 100 MG tablet Take 100 mg by mouth daily.      [provider]  calcium carbonate (OS-CAL) 1250 (500 Ca) MG chewable tablet Chew 1 tablet by  mouth 2 (two) times daily.    [provider]  doxycycline (VIBRA-TABS) 100 MG tablet Take 1 tablet (100 mg total) by mouth 2 (two) times daily. For sinusitis 01/24/17   Panosh, Standley Brooking, MD  fish oil-omega-3 fatty acids 1000 MG capsule Take 1 g by mouth daily.     [provider]  fluticasone (FLONASE) 50 MCG/ACT nasal spray 2 spray each nostril qd Patient taking differently: Place 2 sprays into both nostrils daily as needed for allergies.  06/16/12   Panosh, Standley Brooking, MD  MULTIPLE VITAMIN PO Take 1 tablet by mouth daily.     [provider]  phenazopyridine  (PYRIDIUM) 200 MG tablet Take 200 mg by mouth 3 (three) times daily. 01/22/17   [provider]  PREMARIN vaginal cream once a week. 01/20/17   [provider]  Probiotic Product (ALIGN PO) Take 1 tablet by mouth daily.    [provider]  RESTASIS 0.05 % ophthalmic emulsion Place 1 drop into both eyes 2 (two) times daily. 10/26/14   [provider]    Family History Family History  Problem Relation Age of Onset  . Other Mother        fx hip  . Other Sister        fx hip  . Bladder Cancer Brother   . Colon cancer Neg Hx   . Esophageal cancer Neg Hx   . Stomach cancer Neg Hx   . Rectal cancer Neg Hx   . Liver cancer Neg Hx     Social History Social History   Tobacco Use  . Smoking status: Never Smoker  . Smokeless tobacco: Never Used  Substance Use Topics  . Alcohol use: Yes    Alcohol/week: 0.0 oz    Comment: occ  . Drug use: No     Allergies   Alendronate sodium; Penicillins; Amoxicillin; and Flagyl [metronidazole]   Review of Systems Review of Systems  Constitutional: Negative for chills and fever.  Gastrointestinal: Negative for nausea and vomiting.  Musculoskeletal: Negative for joint swelling.  Skin: Positive for wound. Negative for color change.  Neurological: Negative for weakness and numbness.  All other systems reviewed and are negative.    Physical Exam Updated Vital Signs BP 139/65 (BP Location: Left Arm)   Pulse 81   Temp (!) 97.5 F (36.4 C) (Oral)   Resp 16   Ht 5' 1"  (1.549 m)   Wt 73.5 kg (162 lb)   SpO2 97%   BMI 30.61 kg/m   Physical Exam  Constitutional: She appears well-developed and well-nourished.  HENT:  Head: Normocephalic and atraumatic.  Right Ear: External ear normal.  Left Ear: External ear normal.  Eyes: Conjunctivae are normal. Right eye exhibits no discharge. Left eye exhibits no discharge. No scleral icterus.  Cardiovascular:  Pulses:      Dorsalis pedis pulses are 2+ on the  right side, and 2+ on the left side.       Posterior tibial pulses are 2+ on the right side, and 2+ on the left side.  No LE swelling. No calve TTP.   Pulmonary/Chest: Effort normal. No respiratory distress.  Musculoskeletal:  There is a 1 cm x 1 cm area where cord was removed on the lateral aspect of of right great and medial aspect of second toe.  Bleeding is controlled.  There is no drainage.  No surrounding erythema, induration or fluctuance.  Patient is able to flex and extend her toes actively and passively.  There is no joint swelling.  No proximal foot tenderness to palpation.  Dorsalis pedis and posterior tib pulses 2+.  Sensation intact to light touch.  Unable to evaluate cap refill as patient has pain in toenails, however skin around nail with good blood return.  Neurological: She is alert. She has normal strength. No sensory deficit.  Skin: No pallor.  Psychiatric: She has a normal mood and affect.  Nursing note and vitals reviewed.   ED Treatments / Results  Labs (all labs ordered are listed, but only abnormal results are displayed) Labs Reviewed - No data to display  EKG  EKG Interpretation None       Radiology No results found.  Procedures Procedures (including critical care time)  Medications Ordered in ED Medications - No data to display   Initial Impression / Assessment and Plan / ED Course  I have reviewed the triage vital signs and the nursing notes.  Pertinent labs & imaging results that were available during my care of the patient were reviewed by me and considered in my medical decision making (see chart for details).     83 year old female presenting for pain in right foot after corn removal yesterday.  There is no evidence of infection on exam.  No evidence of septic joint as the patient has no swelling of the joint, redness of the joint or painful range of motion.  No proximal swelling.  Appears to be healing appropriately. Will send home with pain  control with short course of tramadol till she is able to follow up with podiatry. Strict return precautions discussed. Advised continued home care as directed from podiatry. She is to call on Monday for f/u. Patient in agreement with plan and appears safe for discharge. Patient case seen and discussed with Dr. Venora Maples who is in agreement with plan.   Final Clinical Impressions(s) / ED Diagnoses   Final diagnoses:  Foot pain, right    ED Discharge Orders        Ordered    traMADol (ULTRAM) 50 MG tablet  Every 6 hours PRN     02/08/17 0808       Lorelle Gibbs 02/08/17 [place], MD 02/09/17 [phone]

## 2017-02-08 NOTE — Discharge Instructions (Signed)
There is no signs of infection on exam today.  I am sending you home with a pain medication called tramadol.  Please take this for breakthrough pain.  Otherwise please take Tylenol, 650 mg every 6-8 hours as needed for pain.  Please continue home instructions from podiatrist on wound care.  Follow-up with your podiatrist on Monday. If you develop worsening or new concerning symptoms you can return to the emergency department for re-evaluation.

## 2017-02-14 DIAGNOSIS — L97511 Non-pressure chronic ulcer of other part of right foot limited to breakdown of skin: Secondary | ICD-10-CM | POA: Diagnosis not present

## 2017-02-28 DIAGNOSIS — L97511 Non-pressure chronic ulcer of other part of right foot limited to breakdown of skin: Secondary | ICD-10-CM | POA: Diagnosis not present

## 2017-03-03 ENCOUNTER — Ambulatory Visit: Payer: Medicare Other | Admitting: Internal Medicine

## 2017-03-03 ENCOUNTER — Encounter: Payer: Self-pay | Admitting: Internal Medicine

## 2017-03-03 VITALS — BP 128/80 | HR 76 | Temp 98.1°F | Wt 163.8 lb

## 2017-03-03 DIAGNOSIS — J329 Chronic sinusitis, unspecified: Secondary | ICD-10-CM | POA: Diagnosis not present

## 2017-03-03 DIAGNOSIS — H6983 Other specified disorders of Eustachian tube, bilateral: Secondary | ICD-10-CM

## 2017-03-03 DIAGNOSIS — J069 Acute upper respiratory infection, unspecified: Secondary | ICD-10-CM

## 2017-03-03 MED ORDER — PREDNISONE 20 MG PO TABS: 20.0000 mg | ORAL_TABLET | Freq: Two times a day (BID) | ORAL | 0 refills | Status: DC

## 2017-03-03 MED ORDER — DOXYCYCLINE HYCLATE 100 MG PO TABS: 100.0000 mg | ORAL_TABLET | Freq: Two times a day (BID) | ORAL | 0 refills | Status: DC

## 2017-03-03 NOTE — Progress Notes (Signed)
Chief Complaint  Patient presents with  . Ear Pain    left ear worse than right side. Cough and drainage. Denies fever. Pt states that her ears feel full and clogged. started about 1 week ago. using sweet oil to try and loosen and clear up.     HPI: Ashley Lawson 83 y.o.  SDA  See above   1 week hx of  Ur congestion and sinus congestion  With some cough and no fever   Has underlying allergy   Ears feel so clogged and  Pressure   Check for ear infection . No flying, no fever  .  Using saline and INCS Last sinus infection cleared with plan has some chronic congestion but this is different Has a cough but no chest pain shortness of breath.  No hemoptysis. ROS: See pertinent positives and negatives per HPI.  Past Medical History:  Diagnosis Date  . Abdominal pain 12/2009   hospitalized  . Allergic rhinitis   . Chronic constipation   . Compression fx, lumbar spine ([place])   . DDD (degenerative disc disease), lumbar   . Diverticulosis of colon   . Dog bite(E906.0) 08/30/2011  . GERD (gastroesophageal reflux disease)   . Hepatic steatosis   . Hyperlipidemia   . Hyperplastic colon polyp   . IBS (irritable bowel syndrome)    constipation predominant  . Internal hemorrhoids   . Osteoporosis    dexa 2011 -2.7 hip nl spine  . PMR (polymyalgia rheumatica) (HCC) 08/30/2011   under rheum care  on low dose pred 5 mg   . Polymyalgia rheumatica (HCC)     Family History  Problem Relation Age of Onset  . Other Mother        fx hip  . Other Sister        fx hip  . Bladder Cancer Brother   . Colon cancer Neg Hx   . Esophageal cancer Neg Hx   . Stomach cancer Neg Hx   . Rectal cancer Neg Hx   . Liver cancer Neg Hx     Social History   Socioeconomic History  . Marital status: Widowed    Spouse name: None  . Number of children: None  . Years of education: None  . Highest education level: None  Social Needs  . Financial resource strain: None  . Food insecurity - worry: None  .  Food insecurity - inability: None  . Transportation needs - medical: None  . Transportation needs - non-medical: None  Occupational History  . Occupation: retired  Tobacco Use  . Smoking status: Never Smoker  . Smokeless tobacco: Never Used  Substance and Sexual Activity  . Alcohol use: Yes    Alcohol/week: 0.0 oz    Comment: occ  . Drug use: No  . Sexual activity: None  Other Topics Concern  . None  Social History Narrative   Retired   Regular exercise- yes   Widowed   At home with children and GKs    HH of 2     Puppy   Plays cards is social and active     Outpatient Medications Prior to Visit  Medication Sig Dispense Refill  . acetaminophen (TYLENOL) 500 MG tablet Take 500 mg by mouth every 6 (six) hours as needed for mild pain, moderate pain or headache.    . Ascorbic Acid (VITAMIN C) 100 MG tablet Take 100 mg by mouth daily.      . calcium carbonate (OS-CAL) 1250 (  500 Ca) MG chewable tablet Chew 1 tablet by mouth 2 (two) times daily.    . fish oil-omega-3 fatty acids 1000 MG capsule Take 1 g by mouth daily.     . fluticasone (FLONASE) 50 MCG/ACT nasal spray 2 spray each nostril qd (Patient taking differently: Place 2 sprays into both nostrils daily as needed for allergies. ) 16 g 3  . MULTIPLE VITAMIN PO Take 1 tablet by mouth daily.     . phenazopyridine (PYRIDIUM) 200 MG tablet Take 200 mg by mouth 3 (three) times daily.  98  . PREMARIN vaginal cream once a week.  10  . Probiotic Product (ALIGN PO) Take 1 tablet by mouth daily.    . RESTASIS 0.05 % ophthalmic emulsion Place 1 drop into both eyes 2 (two) times daily.  3  . traMADol (ULTRAM) 50 MG tablet Take 1 tablet (50 mg total) every 6 (six) hours as needed by mouth. 10 tablet 0  . doxycycline (VIBRA-TABS) 100 MG tablet Take 1 tablet (100 mg total) by mouth 2 (two) times daily. For sinusitis 14 tablet 0   No facility-administered medications prior to visit.      EXAM:  BP 128/80 (BP Location: Right Arm, Patient  Position: Sitting, Cuff Size: Normal)   Pulse 76   Temp 98.1 F (36.7 C) (Oral)   Wt 163 lb 12.8 oz (74.3 kg)   BMI 30.95 kg/m   Body mass index is 30.95 kg/m.  GENERAL: vitals reviewed and listed above, alert, oriented, appears well hydrated and in no acute distress she has upper respiratory congestion with occasional cough looks well nontoxic with normal respirations. HEENT: atraumatic, conjunctiva  clear, no obvious abnormalities on inspection of external nose and ears nose congested TMs have normal landmarks shiny and gray.  OP : no lesion edema or exudate  NECK: no obvious masses on inspection palpation  LUNGS: clear to auscultation bilaterally, no wheezes, rales or rhonch CV: HRRR, no clubbing cyanosis or  peripheral edema nl cap refill  MS: moves all extremities without noticeable focal  abnormality PSYCH: pleasant and cooperative, no obvious depression or anxiety Total visit 64mns > 50% spent counseling and coordinating care as indicated in above note and in instructions to patient .   ASSESSMENT AND PLAN:  Discussed the following assessment and plan:  Dysfunction of both eustachian tubes  Chronic recurrent sinusitis  URI, acute Acute respiratory congestion probably infection on top of chronic allergy.  This could be viral or could be aggravation of her chronic sinusitis. Options discussed her eustachian tube dysfunction is quite uncomfortable for her.  We will add prednisone 3-5 days if not improving empiric antibiotic treatment for sinusitis.  She has a history of such.  She will continue her antiallergy regimen. -Patient advised to return or notify health care team  if symptoms worsen ,persist or new concerns arise. Shared Decision Making  Patient Instructions  Your ears are not infected at this time but   Are very congested   Causing  The eustachian tube dysfunction  And hard to hear.    Continue   Saline and flonase  Type meds   Prednisone to decrease  The  inflammation   And if not getting better  Add the antibiotic  After 3-4 days      Eustachian Tube Dysfunction The eustachian tube connects the middle ear to the back of the nose. It regulates air pressure in the middle ear by allowing air to move between the ear and nose. It  also helps to drain fluid from the middle ear space. When the eustachian tube does not function properly, air pressure, fluid, or both can build up in the middle ear. Eustachian tube dysfunction can affect one or both ears. What are the causes? This condition happens when the eustachian tube becomes blocked or cannot open normally. This may result from:  Ear infections.  Colds and other upper respiratory infections.  Allergies.  Irritation, such as from cigarette smoke or acid from the stomach coming up into the esophagus (gastroesophageal reflux).  Sudden changes in air pressure, such as from descending in an airplane.  Abnormal growths in the nose or throat, such as nasal polyps, tumors, or enlarged tissue at the back of the throat (adenoids).  What increases the risk? This condition may be more likely to develop in people who smoke and people who are overweight. Eustachian tube dysfunction may also be more likely to develop in children, especially children who have:  Certain birth defects of the mouth, such as cleft palate.  Large tonsils and adenoids.  What are the signs or symptoms? Symptoms of this condition may include:  A feeling of fullness in the ear.  Ear pain.  Clicking or popping noises in the ear.  Ringing in the ear.  Hearing loss.  Loss of balance.  Symptoms may get worse when the air pressure around you changes, such as when you travel to an area of high elevation or fly on an airplane. How is this diagnosed? This condition may be diagnosed based on:  Your symptoms.  A physical exam of your ear, nose, and throat.  Tests, such as those that measure: ? The movement of your  eardrum (tympanogram). ? Your hearing (audiometry).  How is this treated? Treatment depends on the cause and severity of your condition. If your symptoms are mild, you may be able to relieve your symptoms by moving air into ("popping") your ears. If you have symptoms of fluid in your ears, treatment may include:  Decongestants.  Antihistamines.  Nasal sprays or ear drops that contain medicines that reduce swelling (steroids).  In some cases, you may need to have a procedure to drain the fluid in your eardrum (myringotomy). In this procedure, a small tube is placed in the eardrum to:  Drain the fluid.  Restore the air in the middle ear space.  Follow these instructions at home:  Take over-the-counter and prescription medicines only as told by your health care provider.  Use techniques to help pop your ears as recommended by your health care provider. These may include: ? Chewing gum. ? Yawning. ? Frequent, forceful swallowing. ? Closing your mouth, holding your nose closed, and gently blowing as if you are trying to blow air out of your nose.  Do not do any of the following until your health care provider approves: ? Travel to high altitudes. ? Fly in airplanes. ? Work in a Pension scheme manager or room. ? Scuba dive.  Keep your ears dry. Dry your ears completely after showering or bathing.  Do not smoke.  Keep all follow-up visits as told by your health care provider. This is important. Contact a health care provider if:  Your symptoms do not go away after treatment.  Your symptoms come back after treatment.  You are unable to pop your ears.  You have: ? A fever. ? Pain in your ear. ? Pain in your head or neck. ? Fluid draining from your ear.  Your hearing suddenly changes.  You become very dizzy.  You lose your balance. This information is not intended to replace advice given to you by your health care provider. Make sure you discuss any questions you have with  your health care provider. Document Released: 04/14/2015 Document Revised: 08/24/2015 Document Reviewed: 04/06/2014 Elsevier Interactive Patient Education  2018 [place]. Panosh M.D.

## 2017-03-03 NOTE — Patient Instructions (Addendum)
Your ears are not infected at this time but   Are very congested   Causing  The eustachian tube dysfunction  And hard to hear.    Continue   Saline and flonase  Type meds   Prednisone to decrease  The inflammation   And if not getting better  Add the antibiotic  After 3-4 days      Eustachian Tube Dysfunction The eustachian tube connects the middle ear to the back of the nose. It regulates air pressure in the middle ear by allowing air to move between the ear and nose. It also helps to drain fluid from the middle ear space. When the eustachian tube does not function properly, air pressure, fluid, or both can build up in the middle ear. Eustachian tube dysfunction can affect one or both ears. What are the causes? This condition happens when the eustachian tube becomes blocked or cannot open normally. This may result from:  Ear infections.  Colds and other upper respiratory infections.  Allergies.  Irritation, such as from cigarette smoke or acid from the stomach coming up into the esophagus (gastroesophageal reflux).  Sudden changes in air pressure, such as from descending in an airplane.  Abnormal growths in the nose or throat, such as nasal polyps, tumors, or enlarged tissue at the back of the throat (adenoids).  What increases the risk? This condition may be more likely to develop in people who smoke and people who are overweight. Eustachian tube dysfunction may also be more likely to develop in children, especially children who have:  Certain birth defects of the mouth, such as cleft palate.  Large tonsils and adenoids.  What are the signs or symptoms? Symptoms of this condition may include:  A feeling of fullness in the ear.  Ear pain.  Clicking or popping noises in the ear.  Ringing in the ear.  Hearing loss.  Loss of balance.  Symptoms may get worse when the air pressure around you changes, such as when you travel to an area of high elevation or fly on an  airplane. How is this diagnosed? This condition may be diagnosed based on:  Your symptoms.  A physical exam of your ear, nose, and throat.  Tests, such as those that measure: ? The movement of your eardrum (tympanogram). ? Your hearing (audiometry).  How is this treated? Treatment depends on the cause and severity of your condition. If your symptoms are mild, you may be able to relieve your symptoms by moving air into ("popping") your ears. If you have symptoms of fluid in your ears, treatment may include:  Decongestants.  Antihistamines.  Nasal sprays or ear drops that contain medicines that reduce swelling (steroids).  In some cases, you may need to have a procedure to drain the fluid in your eardrum (myringotomy). In this procedure, a small tube is placed in the eardrum to:  Drain the fluid.  Restore the air in the middle ear space.  Follow these instructions at home:  Take over-the-counter and prescription medicines only as told by your health care provider.  Use techniques to help pop your ears as recommended by your health care provider. These may include: ? Chewing gum. ? Yawning. ? Frequent, forceful swallowing. ? Closing your mouth, holding your nose closed, and gently blowing as if you are trying to blow air out of your nose.  Do not do any of the following until your health care provider approves: ? Travel to high altitudes. ? Fly in airplanes. ?  Work in a Pension scheme manager or room. ? Scuba dive.  Keep your ears dry. Dry your ears completely after showering or bathing.  Do not smoke.  Keep all follow-up visits as told by your health care provider. This is important. Contact a health care provider if:  Your symptoms do not go away after treatment.  Your symptoms come back after treatment.  You are unable to pop your ears.  You have: ? A fever. ? Pain in your ear. ? Pain in your head or neck. ? Fluid draining from your ear.  Your hearing suddenly  changes.  You become very dizzy.  You lose your balance. This information is not intended to replace advice given to you by your health care provider. Make sure you discuss any questions you have with your health care provider. Document Released: 04/14/2015 Document Revised: 08/24/2015 Document Reviewed: 04/06/2014 Elsevier Interactive Patient Education  [employer].

## 2017-03-13 ENCOUNTER — Encounter: Payer: Self-pay | Admitting: Family Medicine

## 2017-03-13 ENCOUNTER — Telehealth: Payer: Self-pay | Admitting: Internal Medicine

## 2017-03-13 ENCOUNTER — Ambulatory Visit: Payer: Medicare Other | Admitting: Family Medicine

## 2017-03-13 VITALS — BP 128/64 | HR 86 | Temp 98.3°F | Wt 164.0 lb

## 2017-03-13 DIAGNOSIS — H6983 Other specified disorders of Eustachian tube, bilateral: Secondary | ICD-10-CM

## 2017-03-13 MED ORDER — PREDNISONE 10 MG PO TABS: ORAL_TABLET | ORAL | 0 refills | Status: DC

## 2017-03-13 NOTE — Telephone Encounter (Signed)
Copied from [place]. Topic: Quick Communication - Rx Refill/Question >> Mar 13, 2017  1:24 PM Marin Olp L wrote: Has the patient contacted their pharmacy? No. (one course of antibiotics for sinus infection) (Agent: If no, request that the patient contact the pharmacy for the refill.) Preferred Pharmacy (with phone number or street name): [id number] [place] Please be advised that RX refills may take up to 3 business days. We ask that you follow-up with your pharmacy. Refill doxycycline (VIBRA-TABS) 100 MG tablet. Patient treated for sinus infection/ear infection but still having symptoms. One pill left. Wants to know if she should take prednisone?

## 2017-03-13 NOTE — Progress Notes (Signed)
Subjective:     Patient ID: Ashley Lawson, female   DOB: Jun 17, 1932, 83 y.o.   MRN: [id number]  HPI Patient seen with some bilateral air discomfort. She was seen here recently in treated with doxycycline and prednisone for possible eustachian tube dysfunction and sinusitis. She took prednisone for about 5 days and she felt better when she was taking the prednisone but now feels more congested. She has 1 more day left of doxycycline. No fever. Tried Nasonex and saltwater irrigation without improvement  Past Medical History:  Diagnosis Date  . Abdominal pain 12/2009   hospitalized  . Allergic rhinitis   . Chronic constipation   . Compression fx, lumbar spine ([place])   . DDD (degenerative disc disease), lumbar   . Diverticulosis of colon   . Dog bite(E906.0) 08/30/2011  . GERD (gastroesophageal reflux disease)   . Hepatic steatosis   . Hyperlipidemia   . Hyperplastic colon polyp   . IBS (irritable bowel syndrome)    constipation predominant  . Internal hemorrhoids   . Osteoporosis    dexa 2011 -2.7 hip nl spine  . PMR (polymyalgia rheumatica) (HCC) 08/30/2011   under rheum care  on low dose pred 5 mg   . Polymyalgia rheumatica (HCC)    Past Surgical History:  Procedure Laterality Date  . ABDOMINAL HYSTERECTOMY    . CATARACT EXTRACTION  2010   both  . COLONOSCOPY  2016 was most recent    reports that  has never smoked. she has never used smokeless tobacco. She reports that she drinks alcohol. She reports that she does not use drugs. family history includes Bladder Cancer in her brother; Other in her mother and sister. Allergies  Allergen Reactions  . Alendronate Sodium     upset stomach  . Penicillins   . Amoxicillin Rash    Has patient had a PCN reaction causing immediate rash, facial/tongue/throat swelling, SOB or lightheadedness with hypotension: yes Has patient had a PCN reaction causing severe rash involving mucus membranes or skin necrosis: no Has patient had a PCN  reaction that required hospitalization: no Has patient had PCN reaction within the last 10 years: no  If all of the above answers are "NO", then may proceed with Cephalosporin use.   . Flagyl [Metronidazole] Other (See Comments)    Pounding headaches patient says was bad.  With cipro for diverticulitis rx.     Review of Systems  Constitutional: Negative for chills and fever.  HENT: Positive for congestion and ear pain. Negative for ear discharge, hearing loss and sore throat.   Respiratory: Negative for cough.   Neurological: Negative for headaches.       Objective:   Physical Exam  Constitutional: She appears well-developed and well-nourished.  HENT:  Right Ear: External ear normal.  Left Ear: External ear normal.  Mouth/Throat: Oropharynx is clear and moist. No oropharyngeal exudate.  Neck: Neck supple.  Cardiovascular: Normal rate and regular rhythm.  Pulmonary/Chest: Effort normal and breath sounds normal. No respiratory distress. She has no wheezes. She has no rales.  Lymphadenopathy:    She has no cervical adenopathy.       Assessment:     Probable eustachian tube dysfunction. No evidence for otitis media. Patient did benefit when she was taking prednisone but feels "stopped up" since stopping that    Plan:     -Finish out doxycycline -We agreed to one more taper of prednisone over the next week -Follow-up with primary in one week if not further  improved  Eulas Post MD [facility]

## 2017-03-13 NOTE — Patient Instructions (Signed)
Eustachian Tube Dysfunction The eustachian tube connects the middle ear to the back of the nose. It regulates air pressure in the middle ear by allowing air to move between the ear and nose. It also helps to drain fluid from the middle ear space. When the eustachian tube does not function properly, air pressure, fluid, or both can build up in the middle ear. Eustachian tube dysfunction can affect one or both ears. What are the causes? This condition happens when the eustachian tube becomes blocked or cannot open normally. This may result from:  Ear infections.  Colds and other upper respiratory infections.  Allergies.  Irritation, such as from cigarette smoke or acid from the stomach coming up into the esophagus (gastroesophageal reflux).  Sudden changes in air pressure, such as from descending in an airplane.  Abnormal growths in the nose or throat, such as nasal polyps, tumors, or enlarged tissue at the back of the throat (adenoids).  What increases the risk? This condition may be more likely to develop in people who smoke and people who are overweight. Eustachian tube dysfunction may also be more likely to develop in children, especially children who have:  Certain birth defects of the mouth, such as cleft palate.  Large tonsils and adenoids.  What are the signs or symptoms? Symptoms of this condition may include:  A feeling of fullness in the ear.  Ear pain.  Clicking or popping noises in the ear.  Ringing in the ear.  Hearing loss.  Loss of balance.  Symptoms may get worse when the air pressure around you changes, such as when you travel to an area of high elevation or fly on an airplane. How is this diagnosed? This condition may be diagnosed based on:  Your symptoms.  A physical exam of your ear, nose, and throat.  Tests, such as those that measure: ? The movement of your eardrum (tympanogram). ? Your hearing (audiometry).  How is this treated? Treatment  depends on the cause and severity of your condition. If your symptoms are mild, you may be able to relieve your symptoms by moving air into ("popping") your ears. If you have symptoms of fluid in your ears, treatment may include:  Decongestants.  Antihistamines.  Nasal sprays or ear drops that contain medicines that reduce swelling (steroids).  In some cases, you may need to have a procedure to drain the fluid in your eardrum (myringotomy). In this procedure, a small tube is placed in the eardrum to:  Drain the fluid.  Restore the air in the middle ear space.  Follow these instructions at home:  Take over-the-counter and prescription medicines only as told by your health care provider.  Use techniques to help pop your ears as recommended by your health care provider. These may include: ? Chewing gum. ? Yawning. ? Frequent, forceful swallowing. ? Closing your mouth, holding your nose closed, and gently blowing as if you are trying to blow air out of your nose.  Do not do any of the following until your health care provider approves: ? Travel to high altitudes. ? Fly in airplanes. ? Work in a pressurized cabin or room. ? Scuba dive.  Keep your ears dry. Dry your ears completely after showering or bathing.  Do not smoke.  Keep all follow-up visits as told by your health care provider. This is important. Contact a health care provider if:  Your symptoms do not go away after treatment.  Your symptoms come back after treatment.  You are   unable to pop your ears.  You have: ? A fever. ? Pain in your ear. ? Pain in your head or neck. ? Fluid draining from your ear.  Your hearing suddenly changes.  You become very dizzy.  You lose your balance. This information is not intended to replace advice given to you by your health care provider. Make sure you discuss any questions you have with your health care provider. Document Released: 04/14/2015 Document Revised: 08/24/2015  Document Reviewed: 04/06/2014 Elsevier Interactive Patient Education  2018 [employer]  

## 2017-03-14 NOTE — Telephone Encounter (Signed)
Call in her in a [employer] and tell her to go ahead and take the prednisone along with it

## 2017-03-14 NOTE — Telephone Encounter (Signed)
Medication question attached.

## 2017-03-14 NOTE — Telephone Encounter (Signed)
Pt seen 03/03/17 by Dr Regis Bill and was advised to start abx if symptoms persisted. Pt took round of Doxy but not any better at this point. Never started the Prednisone: 03/03/17 Your ears are not infected at this time but   Are very congested   Causing  The eustachian tube dysfunction  And hard to hear.   Continue   Saline and flonase  Type meds  Prednisone to decrease  The inflammation   And if not getting better  Add the antibiotic  After 3-4 days    Pt is requesting another antibiotic and wants to know if she needs to go ahead and take the Prednisone given 03/03/17.  Dr Jenel Lucks advise if you are okay with giving rec's over the phone if she needs to be seen today. Thanks.

## 2017-03-14 NOTE — Telephone Encounter (Signed)
Pt saw Dr Elease Hashimoto yesterday afternoon and was given Prednisone.  Pt has started taking the Prednisone 03/13/17.  Does not need the antibiotic at this time, she will let us know if her symptoms get any worse.  Pt aware that she can call and request that the abx be sent if her symptoms worsen as we already have one approved for her (she did not want this called in at this time, will let us know)  Nothing further needed.

## 2017-03-17 ENCOUNTER — Telehealth: Payer: Self-pay | Admitting: Internal Medicine

## 2017-03-17 NOTE — Telephone Encounter (Signed)
Patient reports that she is having some bleeding with a BM daily. She reports that she stopped her stool softeners. She is having to strain to have a BM.  She is going to add Miralax to her regimen of prune juice daily .  She will call back if this fails to improve or if her symptoms worsen.

## 2017-03-20 ENCOUNTER — Telehealth: Payer: Self-pay | Admitting: Internal Medicine

## 2017-03-20 NOTE — Telephone Encounter (Signed)
I would wait until Dr. Elease Hashimoto comes back tomorrow since he saw her last. He may want her to be evaluated

## 2017-03-20 NOTE — Telephone Encounter (Signed)
Copied from [place] [id number]. Topic: Quick Communication - See Telephone Encounter >> Mar 20, 2017 11:02 AM Synthia Innocent wrote: CRM for notification. See Telephone encounter for: Patient seen on 03/13/17 w/ Dr Elease Hashimoto. Still having congestion, requesting zpak be called in. Please advise [facility]  03/20/17.

## 2017-03-20 NOTE — Telephone Encounter (Signed)
I called the pt and informed her Dr Regis Bill and Dr Elease Hashimoto are both out of the office and the message will be sent to Dr Elease Hashimoto to address for her.  Patient stated she has had green sinus drainage, history of sinus blockage before and questioned if she has an infection.  Appt scheduled with Dr Volanda Napoleon for tomorrow at 2:30pm.

## 2017-03-21 ENCOUNTER — Ambulatory Visit: Payer: Medicare Other | Admitting: Family Medicine

## 2017-03-21 ENCOUNTER — Telehealth: Payer: Self-pay | Admitting: Internal Medicine

## 2017-03-21 ENCOUNTER — Encounter: Payer: Self-pay | Admitting: Family Medicine

## 2017-03-21 VITALS — BP 130/90 | HR 88 | Temp 98.2°F | Wt 162.2 lb

## 2017-03-21 DIAGNOSIS — J329 Chronic sinusitis, unspecified: Secondary | ICD-10-CM | POA: Diagnosis not present

## 2017-03-21 MED ORDER — AZITHROMYCIN 250 MG PO TABS: ORAL_TABLET | ORAL | 0 refills | Status: DC

## 2017-03-21 MED ORDER — AMBULATORY NON FORMULARY MEDICATION: 0 refills | Status: DC

## 2017-03-21 NOTE — Telephone Encounter (Signed)
I left a detailed message for the patient and asked that she pick up rx at [facility].  She is asked to call back if she has any questions or concerns.

## 2017-03-21 NOTE — Telephone Encounter (Signed)
We can rx diltiazem cream 2% + lidocaine 5% if she wants - has used diltiazem in past

## 2017-03-21 NOTE — Telephone Encounter (Signed)
ok 

## 2017-03-21 NOTE — Progress Notes (Signed)
Subjective:    Patient ID: Ashley Lawson, female    DOB: 01/21/1933, 83 y.o.   MRN: [id number]  No chief complaint on file.   HPI Patient was seen today for for ongoing acute issue.  Patient here today with sinus pressure, nasal congestion,  right-sided facial pain/pressure.  Patient has been having chronic sinusitis/eustachian tube issues since 12/03.  Pt seen again on 12/13.  Patient has been on prednisone, Nettie pot, Flonase, doxycycline.  Past Medical History:  Diagnosis Date  . Abdominal pain 12/2009   hospitalized  . Allergic rhinitis   . Chronic constipation   . Compression fx, lumbar spine ([place])   . DDD (degenerative disc disease), lumbar   . Diverticulosis of colon   . Dog bite(E906.0) 08/30/2011  . GERD (gastroesophageal reflux disease)   . Hepatic steatosis   . Hyperlipidemia   . Hyperplastic colon polyp   . IBS (irritable bowel syndrome)    constipation predominant  . Internal hemorrhoids   . Osteoporosis    dexa 2011 -2.7 hip nl spine  . PMR (polymyalgia rheumatica) (HCC) 08/30/2011   under rheum care  on low dose pred 5 mg   . Polymyalgia rheumatica (HCC)     Allergies  Allergen Reactions  . Alendronate Sodium     upset stomach  . Penicillins   . Amoxicillin Rash    Has patient had a PCN reaction causing immediate rash, facial/tongue/throat swelling, SOB or lightheadedness with hypotension: yes Has patient had a PCN reaction causing severe rash involving mucus membranes or skin necrosis: no Has patient had a PCN reaction that required hospitalization: no Has patient had PCN reaction within the last 10 years: no  If all of the above answers are "NO", then may proceed with Cephalosporin use.   . Flagyl [Metronidazole] Other (See Comments)    Pounding headaches patient says was bad.  With cipro for diverticulitis rx.    ROS General: Denies fever, chills, night sweats, changes in weight, changes in appetite HEENT: Denies headaches, ear pain, changes in  vision, rhinorrhea, sore throat  + nasal congestion, sinus pressure CV: Denies CP, palpitations, SOB, orthopnea Pulm: Denies SOB, cough, wheezing GI: Denies abdominal pain, nausea, vomiting, diarrhea, constipation GU: Denies dysuria, hematuria, frequency, vaginal discharge Msk: Denies muscle cramps, joint pains Neuro: Denies weakness, numbness, tingling Skin: Denies rashes, bruising Psych: Denies depression, anxiety, hallucinations     Objective:    Blood pressure 130/90, pulse 88, temperature 98.2 F (36.8 C), temperature source Oral, weight 162 lb 3.2 oz (73.6 kg), SpO2 98 %.   Gen. Pleasant, well-nourished, in no distress, normal affect  HEENT: [place]/AT, face symmetric, no scleral icterus, PERRLA, maxillary facial tenderness, nares patent without drainage, pharynx with mild drainage and erythema, no exudate. Lungs: no accessory muscle use, CTAB, no wheezes or rales Cardiovascular: RRR, no m/r/g, no peripheral edema Abdomen: BS present, soft, NT/ND Neuro:  A&Ox3, CN II-XII intact, normal gait   Wt Readings from Last 3 Encounters:  03/13/17 164 lb (74.4 kg)  03/03/17 163 lb 12.8 oz (74.3 kg)  02/08/17 162 lb (73.5 kg)    Lab Results  Component Value Date   WBC 6.4 04/19/2016   HGB 13.6 04/19/2016   HCT 38.0 04/19/2016   PLT 251 04/19/2016   GLUCOSE 80 08/16/2016   CHOL 203 (H) 01/22/2016   TRIG 126.0 01/22/2016   HDL 67.80 01/22/2016   LDLDIRECT 125.8 02/23/2013   LDLCALC 110 (H) 01/22/2016   ALT 25 04/04/2016   AST 28  04/04/2016   NA 130 (L) 08/16/2016   K 4.4 08/16/2016   CL 96 08/16/2016   CREATININE 0.81 08/16/2016   BUN 11 08/16/2016   CO2 25 08/16/2016   TSH 4.18 01/22/2016    Assessment/Plan:  Recurrent sinusitis  -Continue flonase, reviewed proper use. - Plan: azithromycin (ZITHROMAX) 250 MG tablet -tylenol ok for pain/discomfort   F/u prn   Grier Mitts, MD

## 2017-03-21 NOTE — Telephone Encounter (Signed)
Patient reports that she is having rectal bleeding and pain with a BM.  She is requesting that we send her in a rx for her rectal bleeding.  I advised her to get some recticare for the discomfort. She is also advised to resume Miralax as we discussed on 03/17/17 triage call.  Please advise

## 2017-03-21 NOTE — Patient Instructions (Addendum)

## 2017-03-30 DIAGNOSIS — J0101 Acute recurrent maxillary sinusitis: Secondary | ICD-10-CM | POA: Diagnosis not present

## 2017-03-31 ENCOUNTER — Telehealth: Payer: Self-pay | Admitting: *Deleted

## 2017-03-31 ENCOUNTER — Telehealth: Payer: Self-pay | Admitting: Internal Medicine

## 2017-03-31 DIAGNOSIS — J329 Chronic sinusitis, unspecified: Secondary | ICD-10-CM

## 2017-03-31 NOTE — Telephone Encounter (Signed)
I advise patient that the referral will probably need to wait until she sees Dr Regis Bill 04/03/17 so that she can be evaluated. Aware that I will send to Dr Regis Bill as Juluis Rainier of request for referral. Nothing further needed.

## 2017-03-31 NOTE — Progress Notes (Signed)
Chief Complaint  Patient presents with  . Annual Exam    Pt still having issues with her ears - sees ENT tomorrow.  Pt feels she may have a UTI    HPI: Ashley Lawson 83 y.o. comes in today for Preventive Medicare exammanagement  visit .Since last visit.  still battling sinsu problems  And  Has been referred to ent  Right maxilla area a problem  Sees uro   frequcncy check urine Has djd spine and last year compression  fx     Had eval and    Med advised but didn't happen.   Gi  ibs  About the same  No sig depression     Health Maintenance  Topic Date Due  . MAMMOGRAM  09/06/2018  . TETANUS/TDAP  08/29/2021  . INFLUENZA VACCINE  Completed  . DEXA SCAN  Completed  . PNA vac Low Risk Adult  Completed   Health Maintenance Review LIFESTYLE:  Exercise:   Goes to y and water not recnetly  Tobacco/ETS:n Alcohol:   little Sugar beverages: pepsi some  Sleep:  Bladder issue   On topical etsrogen some help  Drug use: no HH:  2 daughter  Dog     Hearing:  Ok  X for congestion   Vision:  No limitations at present . Last eye check UTD  Safety:  Has smoke detector and wears seat belts.  No firearms. No excess sun exposure. Sees dentist regularly.  Falls: n.  Memory: Felt to be normal , no concern from her or her family.  Depression: No anhedonia unusual crying or depressive symptoms reactive to illness   Nutrition: Eats well balanced diet; adequate calcium and vitamin D. No swallowing chewing problems.  Injury: no major injuries in the last six months.  Other healthcare providers:  Reviewed today .  Social:  Lives with daughter and daughter    And pet dog   daughetr helps with finances.   Preventive parameters: up-to-date  Reviewed   ADLS:   There are no problems or need for assistance  driving, feeding, obtaining food, dressing, toileting and bathing, managing money using phone. She is independent.     ROS:  See abioev ur frequency llq pain   Constipations   ibs sx hx  if divertic  GEN/ HEENT: No fever, significant weight changes sweats headaches vision problems hearing changes, CV/ PULM; No chest pain shortness of breath cough, syncope,edema  change in exercise tolerance. GI /GU: No adominal pain, vomiting, change in bowel habits. No blood in the stool.  Gu seen urology  In past  SKIN/HEME: ,no acute skin rashes suspicious lesions or bleeding. No lymphadenopathy, nodules, masses.  NEURO/ PSYCH:  No neurologic signs such as weakness numbness. No depression anxiety. IMM/ Allergy: No unusual infections.  Allergy .   REST of 12 system review negative except as per HPI   Past Medical History:  Diagnosis Date  . Abdominal pain 12/2009   hospitalized  . Allergic rhinitis   . Chronic constipation   . Compression fx, lumbar spine ([place])   . DDD (degenerative disc disease), lumbar   . Diverticulosis of colon   . Dog bite(E906.0) 08/30/2011  . GERD (gastroesophageal reflux disease)   . Hepatic steatosis   . Hyperlipidemia   . Hyperplastic colon polyp   . IBS (irritable bowel syndrome)    constipation predominant  . Internal hemorrhoids   . Osteoporosis    dexa 2011 -2.7 hip nl spine  . PMR (polymyalgia  rheumatica) ([place]) 08/30/2011   under rheum care  on low dose pred 5 mg   . Polymyalgia rheumatica (HCC)     Family History  Problem Relation Age of Onset  . Other Mother        fx hip  . Other Sister        fx hip  . Bladder Cancer Brother   . Colon cancer Neg Hx   . Esophageal cancer Neg Hx   . Stomach cancer Neg Hx   . Rectal cancer Neg Hx   . Liver cancer Neg Hx     Social History   Socioeconomic History  . Marital status: Widowed    Spouse name: None  . Number of children: None  . Years of education: None  . Highest education level: None  Social Needs  . Financial resource strain: None  . Food insecurity - worry: None  . Food insecurity - inability: None  . Transportation needs - medical: None  . Transportation needs - non-medical:  None  Occupational History  . Occupation: retired  Tobacco Use  . Smoking status: Never Smoker  . Smokeless tobacco: Never Used  Substance and Sexual Activity  . Alcohol use: Yes    Alcohol/week: 0.0 oz    Comment: occ  . Drug use: No  . Sexual activity: None  Other Topics Concern  . None  Social History Narrative   Retired   Regular exercise- yes   Widowed   At home with children and GKs    HH of 2     Puppy   Plays cards is social and active     Outpatient Encounter Medications as of 04/03/2017  Medication Sig  . acetaminophen (TYLENOL) 500 MG tablet Take 500 mg by mouth every 6 (six) hours as needed for mild pain, moderate pain or headache.  . AMBULATORY NON FORMULARY MEDICATION Medication Name: Diltiazem 2% with lidocaine 5% Apply pea size amount to first knuckle BID  . Ascorbic Acid (VITAMIN C) 100 MG tablet Take 100 mg by mouth daily.    . calcium carbonate (OS-CAL) 1250 (500 Ca) MG chewable tablet Chew 1 tablet by mouth 2 (two) times daily.  . clindamycin (CLEOCIN) 300 MG capsule Take by mouth as directed.  . fish oil-omega-3 fatty acids 1000 MG capsule Take 1 g by mouth daily.   . fluticasone (FLONASE) 50 MCG/ACT nasal spray 2 spray each nostril qd (Patient taking differently: Place 2 sprays into both nostrils daily as needed for allergies. )  . MULTIPLE VITAMIN PO Take 1 tablet by mouth daily.   . phenazopyridine (PYRIDIUM) 200 MG tablet Take 200 mg by mouth 3 (three) times daily.  Marland Kitchen PREMARIN vaginal cream once a week.  . Probiotic Product (ALIGN PO) Take 1 tablet by mouth daily.  . RESTASIS 0.05 % ophthalmic emulsion Place 1 drop into both eyes 2 (two) times daily.  . traMADol (ULTRAM) 50 MG tablet Take 1 tablet (50 mg total) every 6 (six) hours as needed by mouth.  . [DISCONTINUED] azithromycin (ZITHROMAX) 250 MG tablet Take 2 pills on day 1.  Then take 1 pill daily on days 1 through 4. (Patient not taking: Reported on 04/03/2017)   No facility-administered encounter  medications on file as of 04/03/2017.     EXAM:  BP 100/68 (BP Location: Right Arm, Patient Position: Sitting, Cuff Size: Normal)   Pulse 72   Temp 97.9 F (36.6 C) (Oral)   Ht 4' 11"  (1.499 m)   Wt 160  lb 1.6 oz (72.6 kg)   BMI 32.34 kg/m   Body mass index is 32.34 kg/m.  Physical Exam: Vital signs reviewed WRU:EAVW is a well-developed well-nourished alert cooperative   who appears stated age in no acute distress.  HEENT: normocephalic atraumatic , Eyes: PERRL EOM's full, conjunctiva clear, Nares: paten,t no deformity discharge or  Congested and mild tenderness right   Maxilla ., Ears: no deformity EAC's clear TMs with normal landmarks. Mouth: clear OP, no lesions, edema.  Moist mucous membranes. Dentition in adequate repair. NECK: supple without masses, thyromegaly or bruits. CHEST/PULM:  Clear to auscultation and percussion breath sounds equal no wheeze , rales or rhonchi. No chest wall deformities or tenderness.Breast: normal by inspection . No dimpling, discharge, masses, tenderness or discharge . CV: PMI is nondisplaced, S1 S2 no gallops, murmurs, rubs. Peripheral pulses are full without delay.No JVD .  ABDOMEN: Bowel sounds normal nontender  No guard or rebound, no hepato splenomegal no CVA tenderness.   Min llq tenderness  Extremtities:  No clubbing cyanosis or edema, no acute joint swelling or redness no focal atrophy NEURO:  Oriented x3, cranial nerves 3-12 appear to be intact, no obvious focal weakness,gait within normal limits no abnormal reflexes or asymmetrical SKIN: No acute rashes normal turgor, color, no bruising or petechiae. Age related  PSYCH: Oriented, good eye contact, no obvious depression anxiety, cognition and judgment appear normal. LN: no cervical axillary adenopathy No noted deficits in memory, attention, and speech.   Lab Results  Component Value Date   WBC 3.7 (L) 04/03/2017   HGB 12.9 04/03/2017   HCT 37.5 04/03/2017   PLT 231.0 04/03/2017   GLUCOSE  78 04/03/2017   CHOL 188 04/03/2017   TRIG 136.0 04/03/2017   HDL 69.60 04/03/2017   LDLDIRECT 125.8 02/23/2013   LDLCALC 91 04/03/2017   ALT 19 04/03/2017   AST 20 04/03/2017   NA 130 (L) 04/03/2017   K 3.8 04/03/2017   CL 94 (L) 04/03/2017   CREATININE 0.66 04/03/2017   BUN 5 (L) 04/03/2017   CO2 27 04/03/2017   TSH 2.85 04/03/2017  ua ok x slight amount of blood  No  Leuk   ASSESSMENT AND PLAN:  Discussed the following assessment and plan:  Visit for preventive health examination - Plan: Basic metabolic panel, CBC with Differential/Platelet, Hepatic function panel, Lipid panel, TSH  Medication management - Plan: Basic metabolic panel, CBC with Differential/Platelet, Hepatic function panel, Lipid panel, TSH  Chronic recurrent sinusitis - Plan: Basic metabolic panel, CBC with Differential/Platelet, Hepatic function panel, Lipid panel, TSH  Gastroesophageal reflux disease, esophagitis presence not specified - Plan: Basic metabolic panel, CBC with Differential/Platelet, Hepatic function panel, Lipid panel, TSH  Irritable bowel syndrome, unspecified type - Plan: Basic metabolic panel, CBC with Differential/Platelet, Hepatic function panel, Lipid panel, TSH  Lower urinary tract symptoms (LUTS) - Plan: Basic metabolic panel, CBC with Differential/Platelet, Hepatic function panel, Lipid panel, TSH  Osteoporosis, unspecified osteoporosis type, unspecified pathological fracture presence - add prolia  as planned  pt aware  - Plan: Basic metabolic panel, CBC with Differential/Platelet, Hepatic function panel, Lipid panel, TSH  History of UTI - Plan: Basic metabolic panel, CBC with Differential/Platelet, Hepatic function panel, Lipid panel, TSH, POC Urinalysis Dipstick  Dysuria - frequency  see urology  - Plan: POC Urinalysis Dipstick hsof  Compression fx   Had planned on prolia    Will initiate  And fu depending on  Result  Of labs Patient Care Team: Burnis Medin,  MD as PCP -  General MacDiarmid, Nicki Reaper, MD (Urology) Particia Nearing, MD (Dermatology) Luberta Mutter, MD (Ophthalmology) Almedia Balls, MD (Orthopedic Surgery) Ouida Sills  (Rheumatology) Jarome Matin, MD as Consulting Physician (Dermatology)  Patient Instructions  We will arrange for ytoy to get prolia for osteoporosis and prevention of future fracture    Will notify you  of labs when available.   If all ok then yuearly check up    Osteoporosis Osteoporosis is the thinning and loss of density in the bones. Osteoporosis makes the bones more brittle, fragile, and likely to break (fracture). Over time, osteoporosis can cause the bones to become so weak that they fracture after a simple fall. The bones most likely to fracture are the bones in the hip, wrist, and spine. What are the causes? The exact cause is not known. What increases the risk? Anyone can develop osteoporosis. You may be at greater risk if you have a family history of the condition or have poor nutrition. You may also have a higher risk if you are:  Female.  75 years old or older.  A smoker.  Not physically active.  White or Asian.  Slender.  What are the signs or symptoms? A fracture might be the first sign of the disease, especially if it results from a fall or injury that would not usually cause a bone to break. Other signs and symptoms include:  Low back and neck pain.  Stooped posture.  Height loss.  How is this diagnosed? To make a diagnosis, your health care provider may:  Take a medical history.  Perform a physical exam.  Order tests, such as: ? A bone mineral density test. ? A dual-energy X-ray absorptiometry test.  How is this treated? The goal of osteoporosis treatment is to strengthen your bones to reduce your risk of a fracture. Treatment may involve:  Making lifestyle changes, such as: ? Eating a diet rich in calcium. ? Doing weight-bearing and muscle-strengthening exercises. ? Stopping  tobacco use. ? Limiting alcohol intake.  Taking medicine to slow the process of bone loss or to increase bone density.  Monitoring your levels of calcium and vitamin D.  Follow these instructions at home:  Include calcium and vitamin D in your diet. Calcium is important for bone health, and vitamin D helps the body absorb calcium.  Perform weight-bearing and muscle-strengthening exercises as directed by your health care provider.  Do not use any tobacco products, including cigarettes, chewing tobacco, and electronic cigarettes. If you need help quitting, ask your health care provider.  Limit your alcohol intake.  Take medicines only as directed by your health care provider.  Keep all follow-up visits as directed by your health care provider. This is important.  Take precautions at home to lower your risk of falling, such as: ? Keeping rooms well lit and clutter free. ? Installing safety rails on stairs. ? Using rubber mats in the bathroom and other areas that are often wet or slippery. Get help right away if: You fall or injure yourself. This information is not intended to replace advice given to you by your health care provider. Make sure you discuss any questions you have with your health care provider. Document Released: 12/26/2004 Document Revised: 08/21/2015 Document Reviewed: 08/26/2013 Elsevier Interactive Patient Education  2018 [place]. Demitri Kucinski M.D.

## 2017-03-31 NOTE — Telephone Encounter (Signed)
Copied from [place] [phone]. Topic: Referral - Request >> Mar 31, 2017 10:37 AM Ahmed Prima L wrote: Reason for CRM:  Requesting to go and see a ENT. Please call back [phone]

## 2017-03-31 NOTE — Telephone Encounter (Signed)
Not a problem   Come for visit  And can get labs then.   Also I agree with ENT referral see previous phone note.

## 2017-03-31 NOTE — Telephone Encounter (Signed)
Copied from [place] [id number]. Topic: Quick Communication - See Telephone Encounter >> Mar 31, 2017 10:38 AM Ahmed Prima L wrote: CRM for notification. See Telephone encounter for:   03/31/17.  Pt said she has labs on 1/3 and she wants to know if she still needs to come since she is on 3 diff antibiotics. Please call pt back to let her know either way [phone]

## 2017-03-31 NOTE — Telephone Encounter (Signed)
  Spoke with patient, states that she is on Clindamycin. Pt has taken three different abx this month d/t sinusitis issues.  Pt wants to make sure that her being on all these abx wont interfere with her labs.   Please advise Dr Regis Bill, thanks.

## 2017-03-31 NOTE — Telephone Encounter (Signed)
I agree for her to see ENT   Dr Wilburn Cornelia if possible

## 2017-04-02 NOTE — Telephone Encounter (Signed)
Pt aware of rec's per Dr Regis Bill. Referral placed for ENT - urgent/asap.  Pt aware that someone will reach out to her to schedule as soon as they can.  Nothing further needed.

## 2017-04-02 NOTE — Telephone Encounter (Signed)
Referral placed for ENT - urgent/asap.  Pt aware that someone will reach out to her to schedule as soon as they can.  Nothing further needed.

## 2017-04-03 ENCOUNTER — Encounter: Payer: Self-pay | Admitting: Internal Medicine

## 2017-04-03 ENCOUNTER — Ambulatory Visit (INDEPENDENT_AMBULATORY_CARE_PROVIDER_SITE_OTHER): Payer: Medicare Other | Admitting: Internal Medicine

## 2017-04-03 VITALS — BP 100/68 | HR 72 | Temp 97.9°F | Ht 59.0 in | Wt 160.1 lb

## 2017-04-03 DIAGNOSIS — J329 Chronic sinusitis, unspecified: Secondary | ICD-10-CM | POA: Diagnosis not present

## 2017-04-03 DIAGNOSIS — Z0001 Encounter for general adult medical examination with abnormal findings: Secondary | ICD-10-CM | POA: Diagnosis not present

## 2017-04-03 DIAGNOSIS — K589 Irritable bowel syndrome without diarrhea: Secondary | ICD-10-CM

## 2017-04-03 DIAGNOSIS — Z87440 Personal history of urinary (tract) infections: Secondary | ICD-10-CM

## 2017-04-03 DIAGNOSIS — R399 Unspecified symptoms and signs involving the genitourinary system: Secondary | ICD-10-CM

## 2017-04-03 DIAGNOSIS — K219 Gastro-esophageal reflux disease without esophagitis: Secondary | ICD-10-CM | POA: Diagnosis not present

## 2017-04-03 DIAGNOSIS — Z0000 Encounter for general adult medical examination without abnormal findings: Secondary | ICD-10-CM

## 2017-04-03 DIAGNOSIS — Z79899 Other long term (current) drug therapy: Secondary | ICD-10-CM

## 2017-04-03 DIAGNOSIS — M810 Age-related osteoporosis without current pathological fracture: Secondary | ICD-10-CM

## 2017-04-03 DIAGNOSIS — R300 Dysuria: Secondary | ICD-10-CM

## 2017-04-03 LAB — LIPID PANEL
CHOL/HDL RATIO: 3
Cholesterol: 188 mg/dL (ref 0–200)
HDL: 69.6 mg/dL (ref >39.00)
LDL CALC: 91 mg/dL (ref 0–99)
NonHDL: 117.99
Triglycerides: 136 mg/dL (ref 0.0–149.0)
VLDL: 27.2 mg/dL (ref 0.0–40.0)

## 2017-04-03 LAB — POCT URINALYSIS DIPSTICK
BILIRUBIN UA: NEGATIVE
GLUCOSE UA: NEGATIVE
Ketones, UA: NEGATIVE
Leukocytes, UA: NEGATIVE
Nitrite, UA: NEGATIVE
Odor: NEGATIVE
Protein, UA: NEGATIVE
SPEC GRAV UA: 1.01 (ref 1.010–1.025)
Urobilinogen, UA: 0.2 E.U./dL
pH, UA: 7.5 (ref 5.0–8.0)

## 2017-04-03 LAB — CBC WITH DIFFERENTIAL/PLATELET
BASOS ABS: 0 10*3/uL (ref 0.0–0.1)
Basophils Relative: 1.1 % (ref 0.0–3.0)
EOS ABS: 0.1 10*3/uL (ref 0.0–0.7)
EOS PCT: 1.4 % (ref 0.0–5.0)
HCT: 37.5 % (ref 36.0–46.0)
HEMOGLOBIN: 12.9 g/dL (ref 12.0–15.0)
Lymphocytes Relative: 23.6 % (ref 12.0–46.0)
Lymphs Abs: 0.9 10*3/uL (ref 0.7–4.0)
MCHC: 34.5 g/dL (ref 30.0–36.0)
MCV: 95.5 fl (ref 78.0–100.0)
MONO ABS: 0.3 10*3/uL (ref 0.1–1.0)
Monocytes Relative: 9.2 % (ref 3.0–12.0)
NEUTROS PCT: 64.7 % (ref 43.0–77.0)
Neutro Abs: 2.4 10*3/uL (ref 1.4–7.7)
Platelets: 231 10*3/uL (ref 150.0–400.0)
RBC: 3.92 Mil/uL (ref 3.87–5.11)
RDW: 13.3 % (ref 11.5–15.5)
WBC: 3.7 10*3/uL — AB (ref 4.0–10.5)

## 2017-04-03 LAB — BASIC METABOLIC PANEL
BUN: 5 mg/dL — ABNORMAL LOW (ref 6–23)
CALCIUM: 9.2 mg/dL (ref 8.4–10.5)
CO2: 27 mEq/L (ref 19–32)
CREATININE: 0.66 mg/dL (ref 0.40–1.20)
Chloride: 94 mEq/L — ABNORMAL LOW (ref 96–112)
GFR: 90.96 mL/min (ref >60.00)
Glucose, Bld: 78 mg/dL (ref 70–99)
POTASSIUM: 3.8 meq/L (ref 3.5–5.1)
Sodium: 130 mEq/L — ABNORMAL LOW (ref 135–145)

## 2017-04-03 LAB — HEPATIC FUNCTION PANEL
ALK PHOS: 48 U/L (ref 39–117)
ALT: 19 U/L (ref 0–35)
AST: 20 U/L (ref 0–37)
Albumin: 4.3 g/dL (ref 3.5–5.2)
BILIRUBIN DIRECT: 0.2 mg/dL (ref 0.0–0.3)
BILIRUBIN TOTAL: 0.9 mg/dL (ref 0.2–1.2)
Total Protein: 6.7 g/dL (ref 6.0–8.3)

## 2017-04-03 LAB — TSH: TSH: 2.85 u[IU]/mL (ref 0.35–4.50)

## 2017-04-03 NOTE — Patient Instructions (Signed)
We will arrange for ytoy to get prolia for osteoporosis and prevention of future fracture    Will notify you  of labs when available.   If all ok then yuearly check up    Osteoporosis Osteoporosis is the thinning and loss of density in the bones. Osteoporosis makes the bones more brittle, fragile, and likely to break (fracture). Over time, osteoporosis can cause the bones to become so weak that they fracture after a simple fall. The bones most likely to fracture are the bones in the hip, wrist, and spine. What are the causes? The exact cause is not known. What increases the risk? Anyone can develop osteoporosis. You may be at greater risk if you have a family history of the condition or have poor nutrition. You may also have a higher risk if you are:  Female.  70 years old or older.  A smoker.  Not physically active.  White or Asian.  Slender.  What are the signs or symptoms? A fracture might be the first sign of the disease, especially if it results from a fall or injury that would not usually cause a bone to break. Other signs and symptoms include:  Low back and neck pain.  Stooped posture.  Height loss.  How is this diagnosed? To make a diagnosis, your health care provider may:  Take a medical history.  Perform a physical exam.  Order tests, such as: ? A bone mineral density test. ? A dual-energy X-ray absorptiometry test.  How is this treated? The goal of osteoporosis treatment is to strengthen your bones to reduce your risk of a fracture. Treatment may involve:  Making lifestyle changes, such as: ? Eating a diet rich in calcium. ? Doing weight-bearing and muscle-strengthening exercises. ? Stopping tobacco use. ? Limiting alcohol intake.  Taking medicine to slow the process of bone loss or to increase bone density.  Monitoring your levels of calcium and vitamin D.  Follow these instructions at home:  Include calcium and vitamin D in your diet. Calcium  is important for bone health, and vitamin D helps the body absorb calcium.  Perform weight-bearing and muscle-strengthening exercises as directed by your health care provider.  Do not use any tobacco products, including cigarettes, chewing tobacco, and electronic cigarettes. If you need help quitting, ask your health care provider.  Limit your alcohol intake.  Take medicines only as directed by your health care provider.  Keep all follow-up visits as directed by your health care provider. This is important.  Take precautions at home to lower your risk of falling, such as: ? Keeping rooms well lit and clutter free. ? Installing safety rails on stairs. ? Using rubber mats in the bathroom and other areas that are often wet or slippery. Get help right away if: You fall or injure yourself. This information is not intended to replace advice given to you by your health care provider. Make sure you discuss any questions you have with your health care provider. Document Released: 12/26/2004 Document Revised: 08/21/2015 Document Reviewed: 08/26/2013 Elsevier Interactive Patient Education  [employer].

## 2017-04-04 DIAGNOSIS — J329 Chronic sinusitis, unspecified: Secondary | ICD-10-CM | POA: Diagnosis not present

## 2017-04-04 DIAGNOSIS — H6993 Unspecified Eustachian tube disorder, bilateral: Secondary | ICD-10-CM | POA: Diagnosis not present

## 2017-04-11 ENCOUNTER — Ambulatory Visit
Admission: RE | Admit: 2017-04-11 | Discharge: 2017-04-11 | Disposition: A | Discharge status: Home / Self Care | Payer: Medicare Other | Source: Ambulatory Visit | Attending: Otolaryngology | Admitting: Otolaryngology

## 2017-04-11 ENCOUNTER — Other Ambulatory Visit: Payer: Self-pay | Admitting: Otolaryngology

## 2017-04-11 DIAGNOSIS — J320 Chronic maxillary sinusitis: Secondary | ICD-10-CM

## 2017-04-11 DIAGNOSIS — J329 Chronic sinusitis, unspecified: Secondary | ICD-10-CM | POA: Diagnosis not present

## 2017-04-11 DIAGNOSIS — J310 Chronic rhinitis: Secondary | ICD-10-CM | POA: Diagnosis not present

## 2017-04-16 ENCOUNTER — Telehealth: Payer: Self-pay | Admitting: Internal Medicine

## 2017-04-16 NOTE — Telephone Encounter (Signed)
Spoke with pt to inform her that the summery of benefits for Prolia came in. It showed that pt is responsible for 20% of the cost (about $230) every 6 months.  Pt stated that was to much money. Pt was provided with the [employer] [phone] information to apply for grant.   Will follow-up with pt next week.

## 2017-04-24 DIAGNOSIS — R3982 Chronic bladder pain: Secondary | ICD-10-CM | POA: Diagnosis not present

## 2017-04-24 DIAGNOSIS — R102 Pelvic and perineal pain: Secondary | ICD-10-CM | POA: Diagnosis not present

## 2017-04-25 ENCOUNTER — Telehealth: Payer: Self-pay | Admitting: Internal Medicine

## 2017-04-25 NOTE — Telephone Encounter (Signed)
Called pt to follow-up on prolia grant status. Pt verbalized that her daughter doesn't want her talking the injection. pt has refused prolia tx. Please advise.

## 2017-04-30 DIAGNOSIS — M6281 Muscle weakness (generalized): Secondary | ICD-10-CM | POA: Diagnosis not present

## 2017-04-30 DIAGNOSIS — R102 Pelvic and perineal pain: Secondary | ICD-10-CM | POA: Diagnosis not present

## 2017-04-30 DIAGNOSIS — M62838 Other muscle spasm: Secondary | ICD-10-CM | POA: Diagnosis not present

## 2017-04-30 DIAGNOSIS — R3982 Chronic bladder pain: Secondary | ICD-10-CM | POA: Diagnosis not present

## 2017-05-01 ENCOUNTER — Emergency Department (HOSPITAL_COMMUNITY): Payer: Medicare Other

## 2017-05-01 ENCOUNTER — Encounter (HOSPITAL_COMMUNITY): Payer: Self-pay | Admitting: Emergency Medicine

## 2017-05-01 ENCOUNTER — Observation Stay (HOSPITAL_COMMUNITY)
Admission: EM | Admit: 2017-05-01 | Discharge: 2017-05-03 | Disposition: A | Discharge status: Home / Self Care | Payer: Medicare Other | Attending: Internal Medicine | Admitting: Internal Medicine

## 2017-05-01 ENCOUNTER — Ambulatory Visit: Payer: Self-pay

## 2017-05-01 DIAGNOSIS — Z85828 Personal history of other malignant neoplasm of skin: Secondary | ICD-10-CM | POA: Insufficient documentation

## 2017-05-01 DIAGNOSIS — R000 Tachycardia, unspecified: Secondary | ICD-10-CM

## 2017-05-01 DIAGNOSIS — R1030 Lower abdominal pain, unspecified: Secondary | ICD-10-CM | POA: Insufficient documentation

## 2017-05-01 DIAGNOSIS — I4891 Unspecified atrial fibrillation: Secondary | ICD-10-CM

## 2017-05-01 DIAGNOSIS — I480 Paroxysmal atrial fibrillation: Principal | ICD-10-CM | POA: Insufficient documentation

## 2017-05-01 DIAGNOSIS — E871 Hypo-osmolality and hyponatremia: Secondary | ICD-10-CM | POA: Insufficient documentation

## 2017-05-01 DIAGNOSIS — Z79899 Other long term (current) drug therapy: Secondary | ICD-10-CM | POA: Diagnosis not present

## 2017-05-01 DIAGNOSIS — I081 Rheumatic disorders of both mitral and tricuspid valves: Secondary | ICD-10-CM | POA: Insufficient documentation

## 2017-05-01 DIAGNOSIS — Z7951 Long term (current) use of inhaled steroids: Secondary | ICD-10-CM | POA: Insufficient documentation

## 2017-05-01 DIAGNOSIS — Z79890 Hormone replacement therapy (postmenopausal): Secondary | ICD-10-CM | POA: Insufficient documentation

## 2017-05-01 DIAGNOSIS — R638 Other symptoms and signs concerning food and fluid intake: Secondary | ICD-10-CM | POA: Diagnosis not present

## 2017-05-01 DIAGNOSIS — R42 Dizziness and giddiness: Secondary | ICD-10-CM | POA: Diagnosis not present

## 2017-05-01 DIAGNOSIS — R531 Weakness: Secondary | ICD-10-CM

## 2017-05-01 DIAGNOSIS — R109 Unspecified abdominal pain: Secondary | ICD-10-CM | POA: Diagnosis not present

## 2017-05-01 DIAGNOSIS — R1110 Vomiting, unspecified: Secondary | ICD-10-CM | POA: Diagnosis not present

## 2017-05-01 HISTORY — DX: Disorder of kidney and ureter, unspecified: N28.9

## 2017-05-01 LAB — URINALYSIS, ROUTINE W REFLEX MICROSCOPIC
BILIRUBIN URINE: NEGATIVE
Bacteria, UA: NONE SEEN
GLUCOSE, UA: NEGATIVE mg/dL
Ketones, ur: NEGATIVE mg/dL
Leukocytes, UA: NEGATIVE
NITRITE: NEGATIVE
PH: 7 (ref 5.0–8.0)
Protein, ur: NEGATIVE mg/dL
Specific Gravity, Urine: 1.004 — ABNORMAL LOW (ref 1.005–1.030)

## 2017-05-01 LAB — I-STAT TROPONIN, ED: Troponin i, poc: 0 ng/mL (ref 0.00–0.08)

## 2017-05-01 LAB — MAGNESIUM: Magnesium: 2.1 mg/dL (ref 1.7–2.4)

## 2017-05-01 LAB — LIPASE, BLOOD: Lipase: 33 U/L (ref 11–51)

## 2017-05-01 LAB — HEPATIC FUNCTION PANEL
ALBUMIN: 4.4 g/dL (ref 3.5–5.0)
ALT: 24 U/L (ref 14–54)
AST: 30 U/L (ref 15–41)
Alkaline Phosphatase: 58 U/L (ref 38–126)
BILIRUBIN DIRECT: 0.1 mg/dL (ref 0.1–0.5)
BILIRUBIN TOTAL: 0.4 mg/dL (ref 0.3–1.2)
Indirect Bilirubin: 0.3 mg/dL (ref 0.3–0.9)
Total Protein: 7.7 g/dL (ref 6.5–8.1)

## 2017-05-01 LAB — CBC
HCT: 39.2 % (ref 36.0–46.0)
Hemoglobin: 13.7 g/dL (ref 12.0–15.0)
MCH: 32.2 pg (ref 26.0–34.0)
MCHC: 34.9 g/dL (ref 30.0–36.0)
MCV: 92 fL (ref 78.0–100.0)
PLATELETS: 283 10*3/uL (ref 150–400)
RBC: 4.26 MIL/uL (ref 3.87–5.11)
RDW: 12.5 % (ref 11.5–15.5)
WBC: 6.5 10*3/uL (ref 4.0–10.5)

## 2017-05-01 LAB — BASIC METABOLIC PANEL
Anion gap: 11 (ref 5–15)
BUN: 7 mg/dL (ref 6–20)
CALCIUM: 9.5 mg/dL (ref 8.9–10.3)
CO2: 20 mmol/L — AB (ref 22–32)
CREATININE: 0.86 mg/dL (ref 0.44–1.00)
Chloride: 97 mmol/L — ABNORMAL LOW (ref 101–111)
GFR calc Af Amer: >60 mL/min (ref >60)
GLUCOSE: 94 mg/dL (ref 65–99)
Potassium: 3.9 mmol/L (ref 3.5–5.1)
Sodium: 128 mmol/L — ABNORMAL LOW (ref 135–145)

## 2017-05-01 LAB — CBG MONITORING, ED: GLUCOSE-CAPILLARY: 86 mg/dL (ref 65–99)

## 2017-05-01 LAB — PHOSPHORUS: Phosphorus: 2.9 mg/dL (ref 2.5–4.6)

## 2017-05-01 MED ORDER — SODIUM CHLORIDE 0.9 % IV BOLUS (SEPSIS)
500.0000 mL | Freq: Once | INTRAVENOUS | Status: AC
  Administered 2017-05-01: 500 mL via INTRAVENOUS

## 2017-05-01 MED ORDER — METOPROLOL TARTRATE 5 MG/5ML IV SOLN
2.5000 mg | Freq: Once | INTRAVENOUS | Status: AC
  Administered 2017-05-01: 2.5 mg via INTRAVENOUS
  Filled 2017-05-01: qty 5

## 2017-05-01 MED ORDER — HYDROCODONE-ACETAMINOPHEN 5-325 MG PO TABS
2.0000 | ORAL_TABLET | Freq: Once | ORAL | Status: AC
  Administered 2017-05-01: 2 via ORAL
  Filled 2017-05-01: qty 2

## 2017-05-01 MED ORDER — IOPAMIDOL (ISOVUE-300) INJECTION 61%
100.0000 mL | Freq: Once | INTRAVENOUS | Status: AC | PRN
  Administered 2017-05-01: 100 mL via INTRAVENOUS

## 2017-05-01 MED ORDER — METOPROLOL TARTRATE 5 MG/5ML IV SOLN
2.5000 mg | Freq: Once | INTRAVENOUS | Status: DC
  Filled 2017-05-01: qty 5

## 2017-05-01 MED ORDER — SODIUM CHLORIDE 0.9 % IV BOLUS (SEPSIS): 1000.0000 mL | Freq: Once | INTRAVENOUS | Status: DC

## 2017-05-01 NOTE — ED Provider Notes (Signed)
[facility] DEPT Provider Note   CSN: [id number] Arrival date & time: 05/01/17  1633     History   Chief Complaint Chief Complaint  Patient presents with  . Extremity Weakness  . Cystitis    HPI Ashley Lawson is a 84 y.o. female past medical history of GERD, hyperlipidemia, IBS presents for evaluation of generalized weakness, bilateral lower extremity weakness, lower abdominal pain.  Patient reports she had been evaluated for UTI over the last several weeks.  Patient was called approximately 2 days ago and told to start Bactrim for treatment of acute cystitis.  Patient reports that she has never had that medication before.  Patient reports that she took 4 pills of the medication and reports that she became dizzy afterwards.  Patient additionally reports feeling generalized weakness.  Patient called daughter today because she was so weak and felt like she could not walk because of her symptoms.  Additionally, patient reports having some weakness to bilateral lower extremities making it difficult to walk. She states she feels like "they are going ot give out."  Patient reports feeling fatigued, lightheaded and dizzy, like she might fall. She said she fell and landed on the couch. Patient also states that she sometimes feels like the room is spinning.  She reports that symptoms all began after taking medication and does not know she is allergic to medication.  Patient reports that she is continued having some pain with urination and some suprapubic abdominal pain.  Additionally, patient reports decreased appetite.  The history is provided by the patient.    Past Medical History:  Diagnosis Date  . Abdominal pain 12/2009   hospitalized  . Allergic rhinitis   . Chronic constipation   . Compression fx, lumbar spine ([place])   . DDD (degenerative disc disease), lumbar   . Diverticulosis of colon   . Dog bite(E906.0) 08/30/2011  . GERD (gastroesophageal reflux  disease)   . Hepatic steatosis   . Hyperlipidemia   . Hyperplastic colon polyp   . IBS (irritable bowel syndrome)    constipation predominant  . Internal hemorrhoids   . Osteoporosis    dexa 2011 -2.7 hip nl spine  . PMR (polymyalgia rheumatica) ([facility]) 08/30/2011   under rheum care  on low dose pred 5 mg   . Polymyalgia rheumatica ([place])   . Renal disorder     Patient Active Problem List   Diagnosis Date Noted  . Atrial fibrillation with RVR ([place]) 05/02/2017  . Rhinitis, allergic 05/31/2015  . Renal cyst incidental  01/19/2015  . Heartburn 01/14/2014  . Gastroesophageal reflux disease without esophagitis 01/14/2014  . Osteoporosis 01/14/2014  . IBS (irritable bowel syndrome)   . Nail, ingrown 06/22/2012  . Arthritis 02/19/2012  . Screening, lipid 02/18/2011  . Rash and nonspecific skin eruption 02/18/2011  . Visit for preventive health examination 02/18/2011  . Skin cancer 10/14/2010  . Abdominal bloating 10/08/2010  . CONSTIPATION, CHRONIC 01/15/2010  . Lower urinary tract symptoms (LUTS) 01/15/2010  . SUPRAPUBIC PAIN 08/01/2009  . ABDOMINAL PAIN, EPIGASTRIC 06/16/2009  . DIVERTICULOSIS, COLON 06/07/2007  . Hyperlipidemia 06/05/2007  . ANXIETY STATE, UNSPECIFIED 06/05/2007  . Allergic rhinitis 06/05/2007  . IBS 06/05/2007  . OSTEOPOROSIS 06/05/2007  . ABDOMINAL PAIN, GENERALIZED 06/05/2007    Past Surgical History:  Procedure Laterality Date  . ABDOMINAL HYSTERECTOMY    . CATARACT EXTRACTION  2010   both  . COLONOSCOPY  2016 was most recent    OB History  No data available       Home Medications    Prior to Admission medications   Medication Sig Start Date End Date Taking? Authorizing Provider  acetaminophen (TYLENOL) 500 MG tablet Take 500 mg by mouth every 6 (six) hours as needed for mild pain, moderate pain or headache.   Yes [provider]  Ascorbic Acid (VITAMIN C) 100 MG tablet Take 100 mg by mouth daily.     Yes [provider]    calcium carbonate (OS-CAL) 1250 (500 Ca) MG chewable tablet Chew 1 tablet by mouth 2 (two) times daily.   Yes [provider]  fish oil-omega-3 fatty acids 1000 MG capsule Take 1 g by mouth daily.    Yes [provider]  fluticasone (FLONASE) 50 MCG/ACT nasal spray 2 spray each nostril qd Patient taking differently: Place 2 sprays into both nostrils daily as needed for allergies.  06/16/12  Yes Panosh, Standley Brooking, MD  MULTIPLE VITAMIN PO Take 1 tablet by mouth daily.    Yes [provider]  phenazopyridine (PYRIDIUM) 200 MG tablet Take 200 mg by mouth 3 (three) times daily. 01/22/17  Yes [provider]  polyethylene glycol (MIRALAX / GLYCOLAX) packet Take 17 g by mouth daily.   Yes [provider]  PREMARIN vaginal cream once a week. 01/20/17  Yes [provider]  Probiotic Product (ALIGN PO) Take 1 tablet by mouth daily.   Yes [provider]  RESTASIS 0.05 % ophthalmic emulsion Place 1 drop into both eyes 2 (two) times daily. 10/26/14  Yes [provider]  sulfamethoxazole-trimethoprim (BACTRIM DS,SEPTRA DS) 800-160 MG tablet Take 1 tablet by mouth 2 (two) times daily. 04/29/17  Yes [provider]  traMADol (ULTRAM) 50 MG tablet Take 1 tablet (50 mg total) every 6 (six) hours as needed by mouth. 02/08/17  Yes Maczis, Barth Kirks, PA-C  AMBULATORY NON FORMULARY MEDICATION Medication Name: Diltiazem 2% with lidocaine 5% Apply pea size amount to first knuckle BID Patient not taking: Reported on 05/01/2017 03/21/17   Gatha Mayer, MD    Family History Family History  Problem Relation Age of Onset  . Other Mother        fx hip  . Other Sister        fx hip  . Bladder Cancer Brother   . Colon cancer Neg Hx   . Esophageal cancer Neg Hx   . Stomach cancer Neg Hx   . Rectal cancer Neg Hx   . Liver cancer Neg Hx     Social History Social History   Tobacco Use  . Smoking status: Never Smoker  . Smokeless  tobacco: Never Used  Substance Use Topics  . Alcohol use: Yes    Alcohol/week: 0.0 oz    Comment: occ  . Drug use: No     Allergies   Alendronate sodium; Penicillins; Amoxicillin; and Metronidazole   Review of Systems Review of Systems  Constitutional: Positive for appetite change. Negative for fever.  Eyes: Negative for visual disturbance.  Respiratory: Negative for cough and shortness of breath.   Cardiovascular: Negative for chest pain.  Gastrointestinal: Positive for abdominal pain. Negative for nausea and vomiting.  Genitourinary: Negative for dysuria and hematuria.  Neurological: Positive for weakness (generalized). Negative for numbness and headaches.  All other systems reviewed and are negative.    Physical Exam Updated Vital Signs BP 139/78 (BP Location: Right Arm)   Pulse (!) 117   Temp 98.1 F (36.7 C) (Oral)  Resp (!) 24   SpO2 100%   Physical Exam  Constitutional: She is oriented to person, place, and time. She appears well-developed and well-nourished.  HENT:  Head: Normocephalic and atraumatic.  Mouth/Throat: Oropharynx is clear and moist and mucous membranes are normal.  No nystagmus  Eyes: Conjunctivae, EOM and lids are normal. Pupils are equal, round, and reactive to light.  Neck: Full passive range of motion without pain.  Cardiovascular: Normal rate, regular rhythm, normal heart sounds and normal pulses. Exam reveals no gallop and no friction rub.  No murmur heard. Pulmonary/Chest: Effort normal and breath sounds normal.  Abdominal: Soft. Normal appearance. There is tenderness in the suprapubic area. There is no rigidity and no guarding.  Musculoskeletal: Normal range of motion.  Neurological: She is alert and oriented to person, place, and time.  Cranial nerves III-XII intact Follows commands, Moves all extremities  5/5 strength to BUE and BLE  Sensation intact throughout all major nerve distributions Normal finger to nose. No  dysdiadochokinesia. No pronator drift. No slurred speech. No facial droop.   Skin: Skin is warm and dry. Capillary refill takes less than 2 seconds.  Psychiatric: She has a normal mood and affect. Her speech is normal.  Nursing note and vitals reviewed.    ED Treatments / Results  Labs (all labs ordered are listed, but only abnormal results are displayed) Labs Reviewed  BASIC METABOLIC PANEL - Abnormal; Notable for the following components:      Result Value   Sodium 128 (*)    Chloride 97 (*)    CO2 20 (*)    All other components within normal limits  URINALYSIS, ROUTINE W REFLEX MICROSCOPIC - Abnormal; Notable for the following components:   Color, Urine STRAW (*)    Specific Gravity, Urine 1.004 (*)    Hgb urine dipstick SMALL (*)    Squamous Epithelial / LPF 0-5 (*)    All other components within normal limits  CBC  LIPASE, BLOOD  HEPATIC FUNCTION PANEL  MAGNESIUM  PHOSPHORUS  CBC  CREATININE, SERUM  CBG MONITORING, ED  I-STAT TROPONIN, ED    EKG  EKG Interpretation  Date/Time:  Thursday May 01 2017 17:48:35 EST Ventricular Rate:  84 PR Interval:    QRS Duration: 90 QT Interval:  383 QTC Calculation: 453 R Axis:   -10 Text Interpretation:  Sinus rhythm Confirmed by Lacretia Leigh ([id number]) on 05/01/2017 6:28:43 PM       Radiology Ct Head Wo Contrast  Result Date: 05/01/2017 CLINICAL DATA:  Dizziness. EXAM: CT HEAD WITHOUT CONTRAST TECHNIQUE: Contiguous axial images were obtained from the base of the skull through the vertex without intravenous contrast. COMPARISON:  None FINDINGS: Brain: There is atrophy and chronic small vessel disease changes. No acute intracranial abnormality. Specifically, no hemorrhage, hydrocephalus, mass lesion, acute infarction, or significant intracranial injury. Vascular: No hyperdense vessel or unexpected calcification. Skull: No acute calvarial abnormality. Sinuses/Orbits: Visualized paranasal sinuses and mastoids clear. Orbital  soft tissues unremarkable. Other: None IMPRESSION: No acute intracranial abnormality. Atrophy, chronic microvascular disease. Electronically Signed   By: Rolm Baptise M.D.   On: 05/01/2017 18:34   Ct Abdomen Pelvis W Contrast  Result Date: 05/01/2017 CLINICAL DATA:  Abdominal pain, acute, generalized. Nausea, vomiting, weakness and dizziness. EXAM: CT ABDOMEN AND PELVIS WITH CONTRAST TECHNIQUE: Multidetector CT imaging of the abdomen and pelvis was performed using the standard protocol following bolus administration of intravenous contrast. CONTRAST:  131m ISOVUE-300 IOPAMIDOL (ISOVUE-300) INJECTION 61% COMPARISON:  CT abdomen dated  04/04/2016. FINDINGS: Lower chest: No acute abnormality. Hepatobiliary: No focal liver abnormality is seen. No gallstones, gallbladder wall thickening, or biliary dilatation. Pancreas: Unremarkable. No pancreatic ductal dilatation or surrounding inflammatory changes. Spleen: Normal in size without focal abnormality. Adrenals/Urinary Tract: Adrenal glands appear normal. Tiny left renal cysts, as also described on previous MRI. No suspicious mass, stone or hydronephrosis bilaterally. No perinephric inflammation. No ureteral or bladder calculi identified. Bladder appears normal. Stomach/Bowel: Bowel is normal in caliber. No bowel wall thickening or evidence of bowel wall inflammation seen. Appendix is normal. Stomach is unremarkable, partially decompressed. Scattered diverticulosis within the sigmoid and descending colon without evidence of acute diverticulitis. Vascular/Lymphatic: Aortic atherosclerosis. No enlarged abdominal or pelvic lymph nodes. Reproductive: Status post hysterectomy. No adnexal masses. Other: No free fluid or abscess collection. No free intraperitoneal air. Musculoskeletal: Degenerative changes throughout the thoracolumbar spine, mild to moderate in degree. No acute or suspicious osseous finding. Superficial soft tissues are unremarkable. IMPRESSION: 1. No acute  findings within the abdomen or pelvis. No bowel obstruction or evidence of bowel wall inflammation. No free fluid. No evidence of acute solid organ abnormality. No evidence of pyelonephritis. No renal or ureteral calculi. Appendix is normal. 2. Colonic diverticulosis without evidence of acute diverticulitis. 3. Aortic atherosclerosis. Electronically Signed   By: Franki Cabot M.D.   On: 05/01/2017 20:43    Procedures Procedures (including critical care time)  Medications Ordered in ED Medications  fluticasone (FLONASE) 50 MCG/ACT nasal spray 2 spray (not administered)  polyethylene glycol (MIRALAX / GLYCOLAX) packet 17 g (not administered)  cycloSPORINE (RESTASIS) 0.05 % ophthalmic emulsion 1 drop (not administered)  acetaminophen (TYLENOL) tablet 650 mg (not administered)  ondansetron (ZOFRAN) injection 4 mg (not administered)  heparin injection 5,000 Units (not administered)  morphine 2 MG/ML injection 2 mg (not administered)  aspirin EC tablet 325 mg (not administered)  zolpidem (AMBIEN) tablet 5 mg (not administered)  iopamidol (ISOVUE-300) 61 % injection 100 mL (100 mLs Intravenous Contrast Given 05/01/17 2009)  HYDROcodone-acetaminophen (NORCO/VICODIN) 5-325 MG per tablet 2 tablet (2 tablets Oral Given 05/01/17 2234)  sodium chloride 0.9 % bolus 500 mL (0 mLs Intravenous Stopped 05/02/17 0026)  metoprolol tartrate (LOPRESSOR) injection 2.5 mg (2.5 mg Intravenous Given 05/01/17 2303)     Initial Impression / Assessment and Plan / ED Course  I have reviewed the triage vital signs and the nursing notes.  Pertinent labs & imaging results that were available during my care of the patient were reviewed by me and considered in my medical decision making (see chart for details).     84 y.o. F who presents for evaluation of bilateral lower extremity weakness, generalized weakness, fatigue, decreased p.o., suprapubic abdominal pain.  Patient reports that she was recently treated for UTI.  She  states that she was started on Bactrim 2 days ago.  She states that she has never taken this medication before.  She reports that since then, she has had some lightheadedness sensation where she feels like she might pass out.  Additionally, patient states that she became so weak she felt like she could not walk and that her legs would not support her.  Patient reports difficulty ambulating secondary symptoms.  Patient called daughter when symptoms worsen.  Daughter reports that patient is normally able to ablate without any difficulty.  Since then, she has had to require assistance.  Patient denies any room spinning sensation. No neuro deficits noted on exam.  Consider dehydration versus UTI versus orthostatic hypotension versus electrolyte abnormality.  Does not sound like vertigo though a consideration. Doubt CVA given history/physical.  History/physical exam are not concerning for kidney stone.  Also consider benign positional vertigo, the low suspicion given history/physical exam. Plan to check basic labs. Given patient's questionable fall history, will obtain CT head.   Labs and imaging reviewed.  BMP shows hyponatremia 128, bicarb is 20.  CBC unremarkable.  EKG shows Sinus Rhythm, Rate 84.  Appears previous with similar.   Orthostatic VS for the past 24 hrs:  BP- Lying Pulse- Lying BP- Sitting Pulse- Sitting BP- Standing at 0 minutes Pulse- Standing at 0 minutes  05/01/17 1831 158/74 87 153/80 88 166/67 90   CT head is unremarkable.  CT abdomen pelvis is without any acute abnormalities.  Discussed results with patient.  She is still having some lower abdominal pain.  She states that this pain is consistent with her previous pain.  During my reevaluation on the monitor,.  Patient appear to be in A. fib with heart rate in the 120s.  Patient does not have a history of A. fib.  Repeat EKG shows A. fib heart rate 118.  We will plan to give Lopressor at this time. Patient does not have a history of Afib and  had previously been in sinus rhythm. Question if patient had been having paroxysmal afib which was causing her symptoms over the last few days. Given concerns regarding symptoms and afib, will plan for admission.   Discussed with Dr. Aggie Moats (hospitalist). Will admit.   Final Clinical Impressions(s) / ED Diagnoses   Final diagnoses:  Hyponatremia  Atrial fibrillation, unspecified type [facility])  Generalized weakness    ED Discharge Orders    None       Volanda Napoleon, PA-C 05/02/17 0113

## 2017-05-01 NOTE — Telephone Encounter (Signed)
  Reason for Disposition . [1] MODERATE weakness (i.e., interferes with work, school, normal activities) AND [2] cause unknown  (Exceptions: weakness with acute minor illness, or weakness from poor fluid intake) . Pale skin (pallor)  Answer Assessment - Initial Assessment Questions 1. DESCRIPTION: "Describe how you are feeling."     Nausea, vomiting,weak, dizziness 2. SEVERITY: "How bad is it?"  "Can you stand and walk?"   - MILD - Feels weak or tired, but does not interfere with work, school or normal activities   - [place] to stand and walk; weakness interferes with work, school, or normal activities   - SEVERE - Unable to stand or walk     moderate 3. ONSET:  "When did the weakness begin?"     45 minutes ago prior call (approximately 3:15 pm) 4. CAUSE: "What do you think is causing the weakness?"     Pt thinks related to Sulfa med rx for UTI by urologist 5. MEDICINES: "Have you recently started a new medicine or had a change in the amount of a medicine?"     Septra abx 6. OTHER SYMPTOMS: "Do you have any other symptoms?" (e.g., chest pain, fever, cough, SOB, vomiting, diarrhea, bleeding)     vomiting 7. PREGNANCY: "Is there any chance you are pregnant?" "When was your last menstrual period?"     n/a  Protocols used: WEAKNESS (GENERALIZED) AND FATIGUE-A-AH

## 2017-05-01 NOTE — ED Provider Notes (Signed)
Medical screening examination/treatment/procedure(s) were conducted as a shared visit with non-physician practitioner(s) and myself.  I personally evaluated the patient during the encounter.   EKG Interpretation  Date/Time:  Thursday May 01 2017 17:48:35 EST Ventricular Rate:  84 PR Interval:    QRS Duration: 90 QT Interval:  383 QTC Calculation: 453 R Axis:   -10 Text Interpretation:  Sinus rhythm Confirmed by Lacretia Leigh ([id number]) on 05/01/2017 6:28:43 PM     84 year old female here with several days of dizziness and feeling like she was going to pass out.  Patient has chronic pelvic pain.  Patient developed A. fib with rapid ventricular rate response here.  Given Lopressor 2.5 mg IV push for rate control.  Suspect that she is been having symptoms of A. fib with RVR for several days.  Will consult hospitalist for admission   Lacretia Leigh, MD 05/01/17 2239

## 2017-05-01 NOTE — ED Triage Notes (Signed)
Per pt, states she started a sulfa antibiotic on Tuesday for a bladder infection-states she has a history of bladder infections-states today she if feeling weak, dizzy and having bladder pain-states she doesn't know if she is having an allergic reaction to antibiotic

## 2017-05-01 NOTE — Telephone Encounter (Addendum)
Pt was RX Sulfa by her urologist for UTI. Pt took med at 1000 this am and 45 minutes prior to the call to NT, she began to have nausea, vomiting, weakness, and dizziness. According to daughter Santiago Glad Apple) pt is pale. Daughter asking for an appt today. Advised to take pt to nearest [facility] asap to be evaluated. Daughter asked again to check for any openings. None found and advised daughter again to take pt to [facility].   Answer Assessment - Initial Assessment Questions 1. SYMPTOMS: "Do you have any symptoms?"     Nausea paleness weakness dizziness vomiting 2. SEVERITY: If symptoms are present, ask "Are they mild, moderate or severe?"     *No Answer*  Protocols used: MEDICATION QUESTION CALL-A-AH

## 2017-05-02 ENCOUNTER — Other Ambulatory Visit: Payer: Self-pay

## 2017-05-02 ENCOUNTER — Inpatient Hospital Stay (HOSPITAL_COMMUNITY): Payer: Medicare Other

## 2017-05-02 DIAGNOSIS — I361 Nonrheumatic tricuspid (valve) insufficiency: Secondary | ICD-10-CM

## 2017-05-02 DIAGNOSIS — I4891 Unspecified atrial fibrillation: Secondary | ICD-10-CM

## 2017-05-02 DIAGNOSIS — R000 Tachycardia, unspecified: Secondary | ICD-10-CM | POA: Diagnosis not present

## 2017-05-02 LAB — CBC
HCT: 35.9 % — ABNORMAL LOW (ref 36.0–46.0)
Hemoglobin: 12.9 g/dL (ref 12.0–15.0)
MCH: 32.3 pg (ref 26.0–34.0)
MCHC: 35.9 g/dL (ref 30.0–36.0)
MCV: 90 fL (ref 78.0–100.0)
PLATELETS: 257 10*3/uL (ref 150–400)
RBC: 3.99 MIL/uL (ref 3.87–5.11)
RDW: 12.6 % (ref 11.5–15.5)
WBC: 6.8 10*3/uL (ref 4.0–10.5)

## 2017-05-02 LAB — CREATININE, SERUM
CREATININE: 0.83 mg/dL (ref 0.44–1.00)
GFR calc Af Amer: >60 mL/min (ref >60)
GFR calc non Af Amer: >60 mL/min (ref >60)

## 2017-05-02 LAB — ECHOCARDIOGRAM COMPLETE
HEIGHTINCHES: 60 in
Weight: 2613.7738 oz

## 2017-05-02 MED ORDER — SODIUM CHLORIDE 0.9 % IV SOLN
Freq: Once | INTRAVENOUS | Status: AC
  Administered 2017-05-02: 15:00:00 via INTRAVENOUS

## 2017-05-02 MED ORDER — ZOLPIDEM TARTRATE 5 MG PO TABS: 5.0000 mg | ORAL_TABLET | Freq: Every evening | ORAL | Status: DC | PRN

## 2017-05-02 MED ORDER — ASPIRIN EC 325 MG PO TBEC
325.0000 mg | DELAYED_RELEASE_TABLET | Freq: Every day | ORAL | Status: DC
Start: 2017-05-02 — End: 2017-05-02
  Administered 2017-05-02: 325 mg via ORAL
  Filled 2017-05-02: qty 1

## 2017-05-02 MED ORDER — ACETAMINOPHEN 325 MG PO TABS
650.0000 mg | ORAL_TABLET | ORAL | Status: DC | PRN
  Administered 2017-05-02: 650 mg via ORAL
  Filled 2017-05-02: qty 2

## 2017-05-02 MED ORDER — HYDROCODONE-ACETAMINOPHEN 5-325 MG PO TABS
1.0000 | ORAL_TABLET | Freq: Four times a day (QID) | ORAL | Status: DC | PRN
  Administered 2017-05-02: 2 via ORAL
  Filled 2017-05-02: qty 2

## 2017-05-02 MED ORDER — SALINE SPRAY 0.65 % NA SOLN: 1.0000 | NASAL | Status: DC | PRN

## 2017-05-02 MED ORDER — APIXABAN 5 MG PO TABS
5.0000 mg | ORAL_TABLET | Freq: Two times a day (BID) | ORAL | Status: DC
  Administered 2017-05-02 – 2017-05-03 (×2): 5 mg via ORAL
  Filled 2017-05-02 (×2): qty 1

## 2017-05-02 MED ORDER — METOPROLOL TARTRATE 25 MG PO TABS
25.0000 mg | ORAL_TABLET | Freq: Two times a day (BID) | ORAL | Status: DC
  Administered 2017-05-02 – 2017-05-03 (×3): 25 mg via ORAL
  Filled 2017-05-02 (×3): qty 1

## 2017-05-02 MED ORDER — LORATADINE 10 MG PO TABS
10.0000 mg | ORAL_TABLET | Freq: Every day | ORAL | Status: DC | PRN
  Administered 2017-05-02: 10 mg via ORAL
  Filled 2017-05-02: qty 1

## 2017-05-02 MED ORDER — FLUTICASONE PROPIONATE 50 MCG/ACT NA SUSP
2.0000 | Freq: Every day | NASAL | Status: DC | PRN
  Administered 2017-05-02: 2 via NASAL
  Filled 2017-05-02 (×2): qty 16

## 2017-05-02 MED ORDER — HEPARIN SODIUM (PORCINE) 5000 UNIT/ML IJ SOLN
5000.0000 [IU] | Freq: Three times a day (TID) | INTRAMUSCULAR | Status: DC
  Administered 2017-05-02 (×3): 5000 [IU] via SUBCUTANEOUS
  Filled 2017-05-02 (×3): qty 1

## 2017-05-02 MED ORDER — MORPHINE SULFATE (PF) 2 MG/ML IV SOLN: 2.0000 mg | INTRAVENOUS | Status: DC | PRN

## 2017-05-02 MED ORDER — CYCLOSPORINE 0.05 % OP EMUL
1.0000 [drp] | Freq: Two times a day (BID) | OPHTHALMIC | Status: DC
  Administered 2017-05-02 – 2017-05-03 (×3): 1 [drp] via OPHTHALMIC
  Filled 2017-05-02 (×4): qty 1

## 2017-05-02 MED ORDER — ONDANSETRON HCL 4 MG/2ML IJ SOLN: 4.0000 mg | Freq: Four times a day (QID) | INTRAMUSCULAR | Status: DC | PRN

## 2017-05-02 MED ORDER — POLYETHYLENE GLYCOL 3350 17 G PO PACK
17.0000 g | PACK | Freq: Every day | ORAL | Status: DC
  Administered 2017-05-02 – 2017-05-03 (×2): 17 g via ORAL
  Filled 2017-05-02 (×2): qty 1

## 2017-05-02 MED ORDER — SALINE SPRAY 0.65 % NA SOLN
1.0000 | NASAL | Status: DC | PRN
  Filled 2017-05-02: qty 44

## 2017-05-02 NOTE — H&P (Addendum)
[facility] History and Physical  Ashley Lawson ZYS:[id number] DOB: 11/15/1932 DOA: 05/01/2017  Referring physician:  PCP: Burnis Medin, MD   Chief Complaint: "I was dizzy and legs we wobbly."  HPI: Ashley Lawson is a 84 y.o. female with past medical history significant for hyperlipidemia, irritable bowel presents to the emergency room with chief complaint of dizziness and wobbly legs.  Patient had sudden onset of symptoms today.  However she bent over to touch her toes.  Patient was recently treated with Bactrim for UTI.  States she feels that she completed her treatment.  Denies any fever chills.  No other symptoms.  Has been drinking well.  ED course: Found to be in A. fib with RVR.  Given Lopressor.  Hospitalist consulted for admission.   Review of Systems:  As per HPI otherwise 10 point review of systems negative.    Past Medical History:  Diagnosis Date  . Abdominal pain 12/2009   hospitalized  . Allergic rhinitis   . Chronic constipation   . Compression fx, lumbar spine ([place])   . DDD (degenerative disc disease), lumbar   . Diverticulosis of colon   . Dog bite(E906.0) 08/30/2011  . GERD (gastroesophageal reflux disease)   . Hepatic steatosis   . Hyperlipidemia   . Hyperplastic colon polyp   . IBS (irritable bowel syndrome)    constipation predominant  . Internal hemorrhoids   . Osteoporosis    dexa 2011 -2.7 hip nl spine  . PMR (polymyalgia rheumatica) (HCC) 08/30/2011   under rheum care  on low dose pred 5 mg   . Polymyalgia rheumatica ([place])   . Renal disorder    Past Surgical History:  Procedure Laterality Date  . ABDOMINAL HYSTERECTOMY    . CATARACT EXTRACTION  2010   both  . COLONOSCOPY  2016 was most recent   Social History:  reports that  has never smoked. she has never used smokeless tobacco. She reports that she drinks alcohol. She reports that she does not use drugs.  Allergies  Allergen Reactions  . Alendronate Sodium     upset stomach    . Penicillins     Has patient had a PCN reaction causing immediate rash, facial/tongue/throat swelling, SOB or lightheadedness with hypotension: Yes Has patient had a PCN reaction causing severe rash involving mucus membranes or skin necrosis: No Has patient had a PCN reaction that required hospitalization: No Has patient had a PCN reaction occurring within the last 10 years: No If all of the above answers are "NO", then may proceed with Cephalosporin use.   Marland Kitchen Amoxicillin Rash    Has patient had a PCN reaction causing immediate rash, facial/tongue/throat swelling, SOB or lightheadedness with hypotension: yes Has patient had a PCN reaction causing severe rash involving mucus membranes or skin necrosis: no Has patient had a PCN reaction that required hospitalization: no Has patient had PCN reaction within the last 10 years: no  If all of the above answers are "NO", then may proceed with Cephalosporin use.  REACTION: unspecified  . Metronidazole Other (See Comments)    Pounding headaches patient says was bad.  With cipro for diverticulitis rx. Other reaction(s): Other (See Comments) Pounding headaches patient says was bad.With cipro for diverticulitis rx.    Family History  Problem Relation Age of Onset  . Other Mother        fx hip  . Other Sister        fx hip  . Bladder Cancer Brother   .  Colon cancer Neg Hx   . Esophageal cancer Neg Hx   . Stomach cancer Neg Hx   . Rectal cancer Neg Hx   . Liver cancer Neg Hx      Prior to Admission medications   Medication Sig Start Date End Date Taking? Authorizing Provider  acetaminophen (TYLENOL) 500 MG tablet Take 500 mg by mouth every 6 (six) hours as needed for mild pain, moderate pain or headache.   Yes [provider]  Ascorbic Acid (VITAMIN C) 100 MG tablet Take 100 mg by mouth daily.     Yes [provider]  calcium carbonate (OS-CAL) 1250 (500 Ca) MG chewable tablet Chew 1 tablet by mouth 2 (two) times daily.    Yes [provider]  fish oil-omega-3 fatty acids 1000 MG capsule Take 1 g by mouth daily.    Yes [provider]  fluticasone (FLONASE) 50 MCG/ACT nasal spray 2 spray each nostril qd Patient taking differently: Place 2 sprays into both nostrils daily as needed for allergies.  06/16/12  Yes Panosh, Standley Brooking, MD  MULTIPLE VITAMIN PO Take 1 tablet by mouth daily.    Yes [provider]  phenazopyridine (PYRIDIUM) 200 MG tablet Take 200 mg by mouth 3 (three) times daily. 01/22/17  Yes [provider]  polyethylene glycol (MIRALAX / GLYCOLAX) packet Take 17 g by mouth daily.   Yes [provider]  PREMARIN vaginal cream once a week. 01/20/17  Yes [provider]  Probiotic Product (ALIGN PO) Take 1 tablet by mouth daily.   Yes [provider]  RESTASIS 0.05 % ophthalmic emulsion Place 1 drop into both eyes 2 (two) times daily. 10/26/14  Yes [provider]  sulfamethoxazole-trimethoprim (BACTRIM DS,SEPTRA DS) 800-160 MG tablet Take 1 tablet by mouth 2 (two) times daily. 04/29/17  Yes [provider]  traMADol (ULTRAM) 50 MG tablet Take 1 tablet (50 mg total) every 6 (six) hours as needed by mouth. 02/08/17  Yes Maczis, Barth Kirks, PA-C  AMBULATORY NON FORMULARY MEDICATION Medication Name: Diltiazem 2% with lidocaine 5% Apply pea size amount to first knuckle BID Patient not taking: Reported on 05/01/2017 03/21/17   Gatha Mayer, MD   Physical Exam: Vitals:   05/01/17 1800 05/01/17 1934 05/01/17 2211 05/01/17 2305  BP: (!) 143/66 133/71 (!) 115/94 139/78  Pulse: 84 87 (!) 124 (!) 117  Resp: 16 (!) 21 (!) 22 (!) 24  Temp:      TempSrc:      SpO2: 100% 100% 100% 100%    Wt Readings from Last 3 Encounters:  04/03/17 72.6 kg (160 lb 1.6 oz)  03/21/17 73.6 kg (162 lb 3.2 oz)  03/13/17 74.4 kg (164 lb)    General:  Appears calm and comfortable; A&Ox3 Eyes:  PERRL, EOMI, normal lids, iris ENT:  grossly normal  hearing, lips & tongue Neck:  no LAD, masses or thyromegaly Cardiovascular:  RRR, no m/r/g. No LE edema.  Respiratory:  CTA bilaterally, no w/r/r. Normal respiratory effort. Abdomen:  soft, ntnd Skin:  no rash or induration seen on limited exam Musculoskeletal:  grossly normal tone BUE/BLE Psychiatric:  grossly normal mood and affect, speech fluent and appropriate Neurologic:  CN 2-12 grossly intact, moves all extremities in coordinated fashion.          Labs on Admission:  Basic Metabolic Panel: Recent Labs  Lab 05/01/17 1649  NA 128*  K 3.9  CL 97*  CO2 20*  GLUCOSE 94  BUN 7  CREATININE 0.86  CALCIUM 9.5  MG 2.1  PHOS 2.9   Liver Function Tests: Recent Labs  Lab 05/01/17 1649  AST 30  ALT 24  ALKPHOS 58  BILITOT 0.4  PROT 7.7  ALBUMIN 4.4   Recent Labs  Lab 05/01/17 1649  LIPASE 33   No results for input(s): AMMONIA in the last 168 hours. CBC: Recent Labs  Lab 05/01/17 1649  WBC 6.5  HGB 13.7  HCT 39.2  MCV 92.0  PLT 283   Cardiac Enzymes: No results for input(s): CKTOTAL, CKMB, CKMBINDEX, TROPONINI in the last 168 hours.  BNP (last 3 results) No results for input(s): BNP in the last 8760 hours.  ProBNP (last 3 results) No results for input(s): PROBNP in the last 8760 hours.   Creatinine clearance cannot be calculated (Unknown ideal weight.)  CBG: Recent Labs  Lab 05/01/17 1831  GLUCAP 86    Radiological Exams on Admission: Ct Head Wo Contrast  Result Date: 05/01/2017 CLINICAL DATA:  Dizziness. EXAM: CT HEAD WITHOUT CONTRAST TECHNIQUE: Contiguous axial images were obtained from the base of the skull through the vertex without intravenous contrast. COMPARISON:  None FINDINGS: Brain: There is atrophy and chronic small vessel disease changes. No acute intracranial abnormality. Specifically, no hemorrhage, hydrocephalus, mass lesion, acute infarction, or significant intracranial injury. Vascular: No hyperdense vessel or unexpected  calcification. Skull: No acute calvarial abnormality. Sinuses/Orbits: Visualized paranasal sinuses and mastoids clear. Orbital soft tissues unremarkable. Other: None IMPRESSION: No acute intracranial abnormality. Atrophy, chronic microvascular disease. Electronically Signed   By: Rolm Baptise M.D.   On: 05/01/2017 18:34   Ct Abdomen Pelvis W Contrast  Result Date: 05/01/2017 CLINICAL DATA:  Abdominal pain, acute, generalized. Nausea, vomiting, weakness and dizziness. EXAM: CT ABDOMEN AND PELVIS WITH CONTRAST TECHNIQUE: Multidetector CT imaging of the abdomen and pelvis was performed using the standard protocol following bolus administration of intravenous contrast. CONTRAST:  142m ISOVUE-300 IOPAMIDOL (ISOVUE-300) INJECTION 61% COMPARISON:  CT abdomen dated 04/04/2016. FINDINGS: Lower chest: No acute abnormality. Hepatobiliary: No focal liver abnormality is seen. No gallstones, gallbladder wall thickening, or biliary dilatation. Pancreas: Unremarkable. No pancreatic ductal dilatation or surrounding inflammatory changes. Spleen: Normal in size without focal abnormality. Adrenals/Urinary Tract: Adrenal glands appear normal. Tiny left renal cysts, as also described on previous MRI. No suspicious mass, stone or hydronephrosis bilaterally. No perinephric inflammation. No ureteral or bladder calculi identified. Bladder appears normal. Stomach/Bowel: Bowel is normal in caliber. No bowel wall thickening or evidence of bowel wall inflammation seen. Appendix is normal. Stomach is unremarkable, partially decompressed. Scattered diverticulosis within the sigmoid and descending colon without evidence of acute diverticulitis. Vascular/Lymphatic: Aortic atherosclerosis. No enlarged abdominal or pelvic lymph nodes. Reproductive: Status post hysterectomy. No adnexal masses. Other: No free fluid or abscess collection. No free intraperitoneal air. Musculoskeletal: Degenerative changes throughout the thoracolumbar spine, mild to  moderate in degree. No acute or suspicious osseous finding. Superficial soft tissues are unremarkable. IMPRESSION: 1. No acute findings within the abdomen or pelvis. No bowel obstruction or evidence of bowel wall inflammation. No free fluid. No evidence of acute solid organ abnormality. No evidence of pyelonephritis. No renal or ureteral calculi. Appendix is normal. 2. Colonic diverticulosis without evidence of acute diverticulitis. 3. Aortic atherosclerosis. Electronically Signed   By: SFranki CabotM.D.   On: 05/01/2017 20:43    EKG: Independently reviewed. Afib with RVR  Assessment/Plan Principal Problem:   Atrial fibrillation with RVR (HCC)  Afib W/ RVR Converted to NSR CHA2DS2-VASC: 3  Points Metrics  0 Has Congestive Heart Failure:  No   0 Has Vascular Disease:  No   0 Has Hypertension:  No   2 Age:  65   0 Has Diabetes:  No   0 Had Stroke:  No  Had TIA:  No  Had thromboembolism:  No   1 Female:  Yes   - serial trop ordered, initial neg - prn moprhine CP - asaQD - *ECHO ordered for AM - tele bed, cardiac monitoring - zofran prn for nausea Cardiology consult in the AM, order in CXR pending  Low NA Chronic and at Baseline  GERD Will monitor   Allergies Prn flonase  Constipation Qd miralax  Glaucoma restasis eye drops  Code Status: FC  DVT Prophylaxis: heparin Family Communication: Devra Dopp ( dgtr ) [phone] Disposition Plan: Pending Improvement  Status: tele, inpt  Elwin Mocha, MD Family Medicine [facility] [email or web address] Password [email or web address]

## 2017-05-02 NOTE — ED Notes (Signed)
ED TO INPATIENT HANDOFF REPORT  Name/Age/Gender Ashley Lawson 84 y.o. female  Code Status    Code Status Orders  (From admission, onward)        Start     Ordered   05/02/17 0032  Full code  Continuous     05/02/17 0034    Code Status History    Date Active Date Inactive Code Status Order ID Comments User Context   This patient has a current code status but no historical code status.      Home/SNF/Other Home  Chief Complaint Hyponatremia [E87.1] Tachycardia [R00.0] Generalized weakness [R53.1] Atrial fibrillation, unspecified type ([place]) [I48.91]  Level of Care/Admitting Diagnosis ED Disposition    ED Disposition Condition [facility] Area: [facility] [[id number]]  Level of Care: Telemetry [5]  Admit to tele based on following criteria: Complex arrhythmia (Bradycardia/Tachycardia)  Diagnosis: Atrial fibrillation with RVR [facility]) [[id number]]  Admitting Physician: Elwin Mocha [[id number]]  Attending Physician: HOBBS, PHILLIP Jerilynn Mages [[id number]]  Estimated length of stay: 3 - 4 days  Certification:: I certify this patient will need inpatient services for at least 2 midnights  PT Class (Do Not Modify): Inpatient [101]  PT Acc Code (Do Not Modify): Private [1]       Medical History Past Medical History:  Diagnosis Date  . Abdominal pain 12/2009   hospitalized  . Allergic rhinitis   . Chronic constipation   . Compression fx, lumbar spine ([place])   . DDD (degenerative disc disease), lumbar   . Diverticulosis of colon   . Dog bite(E906.0) 08/30/2011  . GERD (gastroesophageal reflux disease)   . Hepatic steatosis   . Hyperlipidemia   . Hyperplastic colon polyp   . IBS (irritable bowel syndrome)    constipation predominant  . Internal hemorrhoids   . Osteoporosis    dexa 2011 -2.7 hip nl spine  . PMR (polymyalgia rheumatica) (HCC) 08/30/2011   under rheum care  on low dose pred 5 mg   . Polymyalgia rheumatica ([place])   . Renal disorder      Allergies Allergies  Allergen Reactions  . Alendronate Sodium     upset stomach  . Penicillins     Has patient had a PCN reaction causing immediate rash, facial/tongue/throat swelling, SOB or lightheadedness with hypotension: Yes Has patient had a PCN reaction causing severe rash involving mucus membranes or skin necrosis: No Has patient had a PCN reaction that required hospitalization: No Has patient had a PCN reaction occurring within the last 10 years: No If all of the above answers are "NO", then may proceed with Cephalosporin use.   Marland Kitchen Amoxicillin Rash    Has patient had a PCN reaction causing immediate rash, facial/tongue/throat swelling, SOB or lightheadedness with hypotension: yes Has patient had a PCN reaction causing severe rash involving mucus membranes or skin necrosis: no Has patient had a PCN reaction that required hospitalization: no Has patient had PCN reaction within the last 10 years: no  If all of the above answers are "NO", then may proceed with Cephalosporin use.  REACTION: unspecified  . Metronidazole Other (See Comments)    Pounding headaches patient says was bad.  With cipro for diverticulitis rx. Other reaction(s): Other (See Comments) Pounding headaches patient says was bad.With cipro for diverticulitis rx.    IV Location/Drains/Wounds Patient Lines/Drains/Airways Status   Active Line/Drains/Airways    Name:   Placement date:   Placement time:   Site:   Days:  Peripheral IV Left;Lateral Antecubital   -    -    Antecubital             Labs/Imaging Results for orders placed or performed during the hospital encounter of 05/01/17 (from the past 48 hour(s))  Basic metabolic panel     Status: Abnormal   Collection Time: 05/01/17  4:49 PM  Result Value Ref Range   Sodium 128 (L) 135 - 145 mmol/L   Potassium 3.9 3.5 - 5.1 mmol/L   Chloride 97 (L) 101 - 111 mmol/L   CO2 20 (L) 22 - 32 mmol/L   Glucose, Bld 94 65 - 99 mg/dL   BUN 7 6 - 20 mg/dL    Creatinine, Ser 0.86 0.44 - 1.00 mg/dL   Calcium 9.5 8.9 - 10.3 mg/dL   GFR calc non Af Amer >60 >60 mL/min   GFR calc Af Amer >60 >60 mL/min    Comment: (NOTE) The eGFR has been calculated using the CKD EPI equation. This calculation has not been validated in all clinical situations. eGFR's persistently <60 mL/min signify possible Chronic Kidney Disease.    Anion gap 11 5 - 15  CBC     Status: None   Collection Time: 05/01/17  4:49 PM  Result Value Ref Range   WBC 6.5 4.0 - 10.5 K/uL   RBC 4.26 3.87 - 5.11 MIL/uL   Hemoglobin 13.7 12.0 - 15.0 g/dL   HCT 39.2 36.0 - 46.0 %   MCV 92.0 78.0 - 100.0 fL   MCH 32.2 26.0 - 34.0 pg   MCHC 34.9 30.0 - 36.0 g/dL   RDW 12.5 11.5 - 15.5 %   Platelets 283 150 - 400 K/uL  Lipase, blood     Status: None   Collection Time: 05/01/17  4:49 PM  Result Value Ref Range   Lipase 33 11 - 51 U/L  Hepatic function panel     Status: None   Collection Time: 05/01/17  4:49 PM  Result Value Ref Range   Total Protein 7.7 6.5 - 8.1 g/dL   Albumin 4.4 3.5 - 5.0 g/dL   AST 30 15 - 41 U/L   ALT 24 14 - 54 U/L   Alkaline Phosphatase 58 38 - 126 U/L   Total Bilirubin 0.4 0.3 - 1.2 mg/dL   Bilirubin, Direct 0.1 0.1 - 0.5 mg/dL   Indirect Bilirubin 0.3 0.3 - 0.9 mg/dL  Magnesium     Status: None   Collection Time: 05/01/17  4:49 PM  Result Value Ref Range   Magnesium 2.1 1.7 - 2.4 mg/dL  Phosphorus     Status: None   Collection Time: 05/01/17  4:49 PM  Result Value Ref Range   Phosphorus 2.9 2.5 - 4.6 mg/dL  Urinalysis, Routine w reflex microscopic     Status: Abnormal   Collection Time: 05/01/17  5:43 PM  Result Value Ref Range   Color, Urine STRAW (A) YELLOW   APPearance CLEAR CLEAR   Specific Gravity, Urine 1.004 (L) 1.005 - 1.030   pH 7.0 5.0 - 8.0   Glucose, UA NEGATIVE NEGATIVE mg/dL   Hgb urine dipstick SMALL (A) NEGATIVE   Bilirubin Urine NEGATIVE NEGATIVE   Ketones, ur NEGATIVE NEGATIVE mg/dL   Protein, ur NEGATIVE NEGATIVE mg/dL    Nitrite NEGATIVE NEGATIVE   Leukocytes, UA NEGATIVE NEGATIVE   RBC / HPF 0-5 0 - 5 RBC/hpf   WBC, UA 0-5 0 - 5 WBC/hpf   Bacteria, UA NONE SEEN NONE  SEEN   Squamous Epithelial / LPF 0-5 (A) NONE SEEN  CBG monitoring, ED     Status: None   Collection Time: 05/01/17  6:31 PM  Result Value Ref Range   Glucose-Capillary 86 65 - 99 mg/dL  I-Stat Troponin, ED (not at [facility])     Status: None   Collection Time: 05/01/17 11:08 PM  Result Value Ref Range   Troponin i, poc 0.00 0.00 - 0.08 ng/mL   Comment 3            Comment: Due to the release kinetics of cTnI, a negative result within the first hours of the onset of symptoms does not rule out myocardial infarction with certainty. If myocardial infarction is still suspected, repeat the test at appropriate intervals.   CBC     Status: Abnormal   Collection Time: 05/02/17  1:44 AM  Result Value Ref Range   WBC 6.8 4.0 - 10.5 K/uL   RBC 3.99 3.87 - 5.11 MIL/uL   Hemoglobin 12.9 12.0 - 15.0 g/dL   HCT 35.9 (L) 36.0 - 46.0 %   MCV 90.0 78.0 - 100.0 fL   MCH 32.3 26.0 - 34.0 pg   MCHC 35.9 30.0 - 36.0 g/dL   RDW 12.6 11.5 - 15.5 %   Platelets 257 150 - 400 K/uL  Creatinine, serum     Status: None   Collection Time: 05/02/17  1:44 AM  Result Value Ref Range   Creatinine, Ser 0.83 0.44 - 1.00 mg/dL   GFR calc non Af Amer >60 >60 mL/min   GFR calc Af Amer >60 >60 mL/min    Comment: (NOTE) The eGFR has been calculated using the CKD EPI equation. This calculation has not been validated in all clinical situations. eGFR's persistently <60 mL/min signify possible Chronic Kidney Disease.    Ct Head Wo Contrast  Result Date: 05/01/2017 CLINICAL DATA:  Dizziness. EXAM: CT HEAD WITHOUT CONTRAST TECHNIQUE: Contiguous axial images were obtained from the base of the skull through the vertex without intravenous contrast. COMPARISON:  None FINDINGS: Brain: There is atrophy and chronic small vessel disease changes. No acute intracranial abnormality.  Specifically, no hemorrhage, hydrocephalus, mass lesion, acute infarction, or significant intracranial injury. Vascular: No hyperdense vessel or unexpected calcification. Skull: No acute calvarial abnormality. Sinuses/Orbits: Visualized paranasal sinuses and mastoids clear. Orbital soft tissues unremarkable. Other: None IMPRESSION: No acute intracranial abnormality. Atrophy, chronic microvascular disease. Electronically Signed   By: Rolm Baptise M.D.   On: 05/01/2017 18:34   Ct Abdomen Pelvis W Contrast  Result Date: 05/01/2017 CLINICAL DATA:  Abdominal pain, acute, generalized. Nausea, vomiting, weakness and dizziness. EXAM: CT ABDOMEN AND PELVIS WITH CONTRAST TECHNIQUE: Multidetector CT imaging of the abdomen and pelvis was performed using the standard protocol following bolus administration of intravenous contrast. CONTRAST:  179m ISOVUE-300 IOPAMIDOL (ISOVUE-300) INJECTION 61% COMPARISON:  CT abdomen dated 04/04/2016. FINDINGS: Lower chest: No acute abnormality. Hepatobiliary: No focal liver abnormality is seen. No gallstones, gallbladder wall thickening, or biliary dilatation. Pancreas: Unremarkable. No pancreatic ductal dilatation or surrounding inflammatory changes. Spleen: Normal in size without focal abnormality. Adrenals/Urinary Tract: Adrenal glands appear normal. Tiny left renal cysts, as also described on previous MRI. No suspicious mass, stone or hydronephrosis bilaterally. No perinephric inflammation. No ureteral or bladder calculi identified. Bladder appears normal. Stomach/Bowel: Bowel is normal in caliber. No bowel wall thickening or evidence of bowel wall inflammation seen. Appendix is normal. Stomach is unremarkable, partially decompressed. Scattered diverticulosis within the sigmoid and descending colon without  evidence of acute diverticulitis. Vascular/Lymphatic: Aortic atherosclerosis. No enlarged abdominal or pelvic lymph nodes. Reproductive: Status post hysterectomy. No adnexal masses.  Other: No free fluid or abscess collection. No free intraperitoneal air. Musculoskeletal: Degenerative changes throughout the thoracolumbar spine, mild to moderate in degree. No acute or suspicious osseous finding. Superficial soft tissues are unremarkable. IMPRESSION: 1. No acute findings within the abdomen or pelvis. No bowel obstruction or evidence of bowel wall inflammation. No free fluid. No evidence of acute solid organ abnormality. No evidence of pyelonephritis. No renal or ureteral calculi. Appendix is normal. 2. Colonic diverticulosis without evidence of acute diverticulitis. 3. Aortic atherosclerosis. Electronically Signed   By: Franki Cabot M.D.   On: 05/01/2017 20:43   Dg Chest Port 1 View  Result Date: 05/02/2017 CLINICAL DATA:  84 y/o  F; tachycardia. EXAM: PORTABLE CHEST 1 VIEW COMPARISON:  02/06/2016 chest radiograph FINDINGS: Normal cardiac silhouette. Aortic atherosclerosis with calcification. Clear lungs. No pleural effusion or pneumothorax. No acute osseous abnormality is evident. IMPRESSION: No active disease. Electronically Signed   By: Kristine Garbe M.D.   On: 05/02/2017 01:26    Pending Labs Unresulted Labs (From admission, onward)   None      Vitals/Pain Today's Vitals   05/01/17 2211 05/01/17 2305 05/02/17 0027 05/02/17 0150  BP: (!) 115/94 139/78  (!) 141/63  Pulse: (!) 124 (!) 117  69  Resp: (!) 22 (!) 24  19  Temp:      TempSrc:      SpO2: 100% 100%  100%  PainSc:  10-Worst pain ever 4      Isolation Precautions No active isolations  Medications Medications  fluticasone (FLONASE) 50 MCG/ACT nasal spray 2 spray (2 sprays Each Nare Given 05/02/17 0218)  polyethylene glycol (MIRALAX / GLYCOLAX) packet 17 g (not administered)  cycloSPORINE (RESTASIS) 0.05 % ophthalmic emulsion 1 drop (1 drop Both Eyes Given 05/02/17 0149)  acetaminophen (TYLENOL) tablet 650 mg (not administered)  ondansetron (ZOFRAN) injection 4 mg (not administered)  heparin  injection 5,000 Units (5,000 Units Subcutaneous Given 05/02/17 0150)  morphine 2 MG/ML injection 2 mg (not administered)  aspirin EC tablet 325 mg (not administered)  zolpidem (AMBIEN) tablet 5 mg (not administered)  iopamidol (ISOVUE-300) 61 % injection 100 mL (100 mLs Intravenous Contrast Given 05/01/17 2009)  HYDROcodone-acetaminophen (NORCO/VICODIN) 5-325 MG per tablet 2 tablet (2 tablets Oral Given 05/01/17 2234)  sodium chloride 0.9 % bolus 500 mL (0 mLs Intravenous Stopped 05/02/17 0026)  metoprolol tartrate (LOPRESSOR) injection 2.5 mg (2.5 mg Intravenous Given 05/01/17 2303)    Mobility walks

## 2017-05-02 NOTE — Progress Notes (Signed)
PROGRESS NOTE    Ashley Lawson  OEU:[id number] DOB: [birth year] DOA: 05/01/2017 PCP: Burnis Medin, MD Brief Narrative: 84 y.o. female with past medical history significant for hyperlipidemia, irritable bowel presents to the emergency room with chief complaint of dizziness and wobbly legs.  Patient had sudden onset of symptoms today.  However she bent over to touch her toes.  Patient was recently treated with Bactrim for UTI.  States she feels that she completed her treatment.  Denies any fever chills.  No other symptoms.  Has been drinking well.  ED course: Found to be in A. fib with RVR.  Given Lopressor.  Hospitalist consulted for admission.    Assessment & Plan:   Principal Problem:   Atrial fibrillation with RVR [facility])  1]AFIB/RVR-appreciate cardiology evaluation.  Noted Eliquis 5 mg twice a day started along with beta-blocker today.  Echocardiogram has been done and the results are pending.  TSH is normal.  2]Hyponatremia-patient has been having chronic hyponatremia since 01/2016.  Will follow up tomorrow.  He will bag of normal saline 75 cc an hour 1 lit.    DVT prophylaxis: Eliquis Code Status full code Family Communication no family available Disposition Plan: TBD Consultants:  Cardiology Procedures: None none Antimicrobials: None  Subjective: Feels tired  Objective: Vitals:   05/02/17 0150 05/02/17 0356 05/02/17 0515 05/02/17 1421  BP: (!) 141/63 140/64  118/68  Pulse: 69 75  91  Resp: 19 18  14   Temp:  97.9 F (36.6 C)  97.7 F (36.5 C)  TempSrc:  Oral  Oral  SpO2: 100% 100%  97%  Weight:   74.1 kg (163 lb 5.8 oz)   Height:   5' (1.524 m)     Intake/Output Summary (Last 24 hours) at 05/02/2017 1454 Last data filed at 05/02/2017 0026 Gross per 24 hour  Intake 500 ml  Output -  Net 500 ml   Filed Weights   05/02/17 0515  Weight: 74.1 kg (163 lb 5.8 oz)    Examination:  General exam: Appears calm and comfortable  Respiratory system: Clear to  auscultation. Respiratory effort normal. Cardiovascular system: S1 & S2 heard, RRR. No JVD, murmurs, rubs, gallops or clicks. No pedal edema. Gastrointestinal system: Abdomen is nondistended, soft and nontender. No organomegaly or masses felt. Normal bowel sounds heard. Central nervous system: Alert and oriented. No focal neurological deficits. Extremities: Symmetric 5 x 5 power. Skin: No rashes, lesions or ulcers Psychiatry: Judgement and insight appear normal. Mood & affect appropriate.     Data Reviewed: I have personally reviewed following labs and imaging studies  CBC: Recent Labs  Lab 05/01/17 1649 05/02/17 0144  WBC 6.5 6.8  HGB 13.7 12.9  HCT 39.2 35.9*  MCV 92.0 90.0  PLT 283 154   Basic Metabolic Panel: Recent Labs  Lab 05/01/17 1649 05/02/17 0144  NA 128*  --   K 3.9  --   CL 97*  --   CO2 20*  --   GLUCOSE 94  --   BUN 7  --   CREATININE 0.86 0.83  CALCIUM 9.5  --   MG 2.1  --   PHOS 2.9  --    GFR: Estimated Creatinine Clearance: 46.9 mL/min (by C-G formula based on SCr of 0.83 mg/dL). Liver Function Tests: Recent Labs  Lab 05/01/17 1649  AST 30  ALT 24  ALKPHOS 58  BILITOT 0.4  PROT 7.7  ALBUMIN 4.4   Recent Labs  Lab 05/01/17 1649  LIPASE 33  No results for input(s): AMMONIA in the last 168 hours. Coagulation Profile: No results for input(s): INR, PROTIME in the last 168 hours. Cardiac Enzymes: No results for input(s): CKTOTAL, CKMB, CKMBINDEX, TROPONINI in the last 168 hours. BNP (last 3 results) No results for input(s): PROBNP in the last 8760 hours. HbA1C: No results for input(s): HGBA1C in the last 72 hours. CBG: Recent Labs  Lab 05/01/17 1831  GLUCAP 86   Lipid Profile: No results for input(s): CHOL, HDL, LDLCALC, TRIG, CHOLHDL, LDLDIRECT in the last 72 hours. Thyroid Function Tests: No results for input(s): TSH, T4TOTAL, FREET4, T3FREE, THYROIDAB in the last 72 hours. Anemia Panel: No results for input(s): VITAMINB12,  FOLATE, FERRITIN, TIBC, IRON, RETICCTPCT in the last 72 hours. Sepsis Labs: No results for input(s): PROCALCITON, LATICACIDVEN in the last 168 hours.  No results found for this or any previous visit (from the past 240 hour(s)).       Radiology Studies: Ct Head Wo Contrast  Result Date: 05/01/2017 CLINICAL DATA:  Dizziness. EXAM: CT HEAD WITHOUT CONTRAST TECHNIQUE: Contiguous axial images were obtained from the base of the skull through the vertex without intravenous contrast. COMPARISON:  None FINDINGS: Brain: There is atrophy and chronic small vessel disease changes. No acute intracranial abnormality. Specifically, no hemorrhage, hydrocephalus, mass lesion, acute infarction, or significant intracranial injury. Vascular: No hyperdense vessel or unexpected calcification. Skull: No acute calvarial abnormality. Sinuses/Orbits: Visualized paranasal sinuses and mastoids clear. Orbital soft tissues unremarkable. Other: None IMPRESSION: No acute intracranial abnormality. Atrophy, chronic microvascular disease. Electronically Signed   By: Rolm Baptise M.D.   On: 05/01/2017 18:34   Ct Abdomen Pelvis W Contrast  Result Date: 05/01/2017 CLINICAL DATA:  Abdominal pain, acute, generalized. Nausea, vomiting, weakness and dizziness. EXAM: CT ABDOMEN AND PELVIS WITH CONTRAST TECHNIQUE: Multidetector CT imaging of the abdomen and pelvis was performed using the standard protocol following bolus administration of intravenous contrast. CONTRAST:  180m ISOVUE-300 IOPAMIDOL (ISOVUE-300) INJECTION 61% COMPARISON:  CT abdomen dated 04/04/2016. FINDINGS: Lower chest: No acute abnormality. Hepatobiliary: No focal liver abnormality is seen. No gallstones, gallbladder wall thickening, or biliary dilatation. Pancreas: Unremarkable. No pancreatic ductal dilatation or surrounding inflammatory changes. Spleen: Normal in size without focal abnormality. Adrenals/Urinary Tract: Adrenal glands appear normal. Tiny left renal cysts, as  also described on previous MRI. No suspicious mass, stone or hydronephrosis bilaterally. No perinephric inflammation. No ureteral or bladder calculi identified. Bladder appears normal. Stomach/Bowel: Bowel is normal in caliber. No bowel wall thickening or evidence of bowel wall inflammation seen. Appendix is normal. Stomach is unremarkable, partially decompressed. Scattered diverticulosis within the sigmoid and descending colon without evidence of acute diverticulitis. Vascular/Lymphatic: Aortic atherosclerosis. No enlarged abdominal or pelvic lymph nodes. Reproductive: Status post hysterectomy. No adnexal masses. Other: No free fluid or abscess collection. No free intraperitoneal air. Musculoskeletal: Degenerative changes throughout the thoracolumbar spine, mild to moderate in degree. No acute or suspicious osseous finding. Superficial soft tissues are unremarkable. IMPRESSION: 1. No acute findings within the abdomen or pelvis. No bowel obstruction or evidence of bowel wall inflammation. No free fluid. No evidence of acute solid organ abnormality. No evidence of pyelonephritis. No renal or ureteral calculi. Appendix is normal. 2. Colonic diverticulosis without evidence of acute diverticulitis. 3. Aortic atherosclerosis. Electronically Signed   By: SFranki CabotM.D.   On: 05/01/2017 20:43   Dg Chest Port 1 View  Result Date: 05/02/2017 CLINICAL DATA:  84y/o  F; tachycardia. EXAM: PORTABLE CHEST 1 VIEW COMPARISON:  02/06/2016 chest radiograph FINDINGS:  Normal cardiac silhouette. Aortic atherosclerosis with calcification. Clear lungs. No pleural effusion or pneumothorax. No acute osseous abnormality is evident. IMPRESSION: No active disease. Electronically Signed   By: Kristine Garbe M.D.   On: 05/02/2017 01:26        Scheduled Meds: . apixaban  5 mg Oral BID  . cycloSPORINE  1 drop Both Eyes BID  . metoprolol tartrate  25 mg Oral BID  . polyethylene glycol  17 g Oral Daily   Continuous  Infusions:   LOS: 0 days    Georgette Shell, MD [facility] If 7PM-7AM, please contact night-coverage [email or web address] Password [email or web address] 05/02/2017, 2:54 PM

## 2017-05-02 NOTE — Progress Notes (Signed)
  Echocardiogram 2D Echocardiogram has been performed.  Darlina Sicilian M 05/02/2017, 9:38 AM

## 2017-05-02 NOTE — Plan of Care (Signed)
  Activity: Risk for activity intolerance will decrease 05/02/2017 1418 - Progressing by Dorene Sorrow, RN   Nutrition: Adequate nutrition will be maintained 05/02/2017 1418 - Progressing by Dorene Sorrow, RN   Pain Managment: General experience of comfort will improve 05/02/2017 1418 - Progressing by Dorene Sorrow, RN

## 2017-05-02 NOTE — Consult Note (Signed)
Cardiology Consultation:   Patient ID: Ashley Lawson; [id number]; 12-25-1932   Admit date: 05/01/2017 Date of Consult: 05/02/2017  Primary Care Provider: Burnis Medin, MD Primary Cardiologist: new   Patient Profile:   Ashley Lawson is a 84 y.o. female with nor prior cardiac issues who is being seen today for the evaluation of PAF at the request of Rodena Piety.  History of Present Illness:   Ashley Lawson is a pleasant 84 y/o female from [place] who lives with her daughter. She has no history of MI, CAD, HTN, palpitations, or PAF. She recently had a sinus infection as an OP that was treated with antibiotics, then a UTI. She tells me she recently had some hematuria after an OP bladder procedure (? scope) by Dr Matilde Sprang but this has cleared.   She presented to the ED 05/01/17 with weakness "my legs gave out". She was in NSR on admission but a few hours later, around 10 pm, she went into AF. She was given one dose of Lopressor IV and her EKG at midnight shows her to be in NSR. Telemetry today review shows she is actually in AF now with VR 90. She is unaware of being in AF and has no history of palpitations.   Past Medical History:  Diagnosis Date  . Abdominal pain 12/2009   hospitalized  . Allergic rhinitis   . Chronic constipation   . Compression fx, lumbar spine ([place])   . DDD (degenerative disc disease), lumbar   . Diverticulosis of colon   . Dog bite(E906.0) 08/30/2011  . GERD (gastroesophageal reflux disease)   . Hepatic steatosis   . Hyperlipidemia   . Hyperplastic colon polyp   . IBS (irritable bowel syndrome)    constipation predominant  . Internal hemorrhoids   . Osteoporosis    dexa 2011 -2.7 hip nl spine  . PMR (polymyalgia rheumatica) (H[facility]) 08/30/2011   under rheum care  on low dose pred 5 mg   . Polymyalgia rheumatica ([place])   . Renal disorder     Past Surgical History:  Procedure Laterality Date  . ABDOMINAL HYSTERECTOMY    . CATARACT EXTRACTION  2010   both  .  COLONOSCOPY  2016 was most recent     Home Medications:  Prior to Admission medications   Medication Sig Start Date End Date Taking? Authorizing Provider  acetaminophen (TYLENOL) 500 MG tablet Take 500 mg by mouth every 6 (six) hours as needed for mild pain, moderate pain or headache.   Yes [provider]  Ascorbic Acid (VITAMIN C) 100 MG tablet Take 100 mg by mouth daily.     Yes [provider]  calcium carbonate (OS-CAL) 1250 (500 Ca) MG chewable tablet Chew 1 tablet by mouth 2 (two) times daily.   Yes [provider]  fish oil-omega-3 fatty acids 1000 MG capsule Take 1 g by mouth daily.    Yes [provider]  fluticasone (FLONASE) 50 MCG/ACT nasal spray 2 spray each nostril qd Patient taking differently: Place 2 sprays into both nostrils daily as needed for allergies.  06/16/12  Yes Panosh, Standley Brooking, MD  MULTIPLE VITAMIN PO Take 1 tablet by mouth daily.    Yes [provider]  phenazopyridine (PYRIDIUM) 200 MG tablet Take 200 mg by mouth 3 (three) times daily. 01/22/17  Yes [provider]  polyethylene glycol (MIRALAX / GLYCOLAX) packet Take 17 g by mouth daily.   Yes [provider]  PREMARIN vaginal cream once a week.  01/20/17  Yes [provider]  Probiotic Product (ALIGN PO) Take 1 tablet by mouth daily.   Yes [provider]  RESTASIS 0.05 % ophthalmic emulsion Place 1 drop into both eyes 2 (two) times daily. 10/26/14  Yes [provider]  sulfamethoxazole-trimethoprim (BACTRIM DS,SEPTRA DS) 800-160 MG tablet Take 1 tablet by mouth 2 (two) times daily. 04/29/17  Yes [provider]  traMADol (ULTRAM) 50 MG tablet Take 1 tablet (50 mg total) every 6 (six) hours as needed by mouth. 02/08/17  Yes Maczis, Barth Kirks, PA-C  AMBULATORY NON FORMULARY MEDICATION Medication Name: Diltiazem 2% with lidocaine 5% Apply pea size amount to first knuckle BID Patient not taking: Reported on 05/01/2017  03/21/17   Gatha Mayer, MD    Inpatient Medications: Scheduled Meds: . aspirin EC  325 mg Oral Daily  . cycloSPORINE  1 drop Both Eyes BID  . heparin  5,000 Units Subcutaneous Q8H  . polyethylene glycol  17 g Oral Daily   Continuous Infusions:  PRN Meds: acetaminophen, fluticasone, HYDROcodone-acetaminophen, loratadine, morphine injection, ondansetron (ZOFRAN) IV, zolpidem  Allergies:    Allergies  Allergen Reactions  . Alendronate Sodium     upset stomach  . Penicillins     Has patient had a PCN reaction causing immediate rash, facial/tongue/throat swelling, SOB or lightheadedness with hypotension: Yes Has patient had a PCN reaction causing severe rash involving mucus membranes or skin necrosis: No Has patient had a PCN reaction that required hospitalization: No Has patient had a PCN reaction occurring within the last 10 years: No If all of the above answers are "NO", then may proceed with Cephalosporin use.   Marland Kitchen Amoxicillin Rash    Has patient had a PCN reaction causing immediate rash, facial/tongue/throat swelling, SOB or lightheadedness with hypotension: yes Has patient had a PCN reaction causing severe rash involving mucus membranes or skin necrosis: no Has patient had a PCN reaction that required hospitalization: no Has patient had PCN reaction within the last 10 years: no  If all of the above answers are "NO", then may proceed with Cephalosporin use.  REACTION: unspecified  . Metronidazole Other (See Comments)    Pounding headaches patient says was bad.  With cipro for diverticulitis rx. Other reaction(s): Other (See Comments) Pounding headaches patient says was bad.With cipro for diverticulitis rx.    Social History:   Social History   Socioeconomic History  . Marital status: Widowed    Spouse name: Not on file  . Number of children: Not on file  . Years of education: Not on file  . Highest education level: Not on file  Social Needs  . Financial  resource strain: Not on file  . Food insecurity - worry: Not on file  . Food insecurity - inability: Not on file  . Transportation needs - medical: Not on file  . Transportation needs - non-medical: Not on file  Occupational History  . Occupation: retired  Tobacco Use  . Smoking status: Never Smoker  . Smokeless tobacco: Never Used  Substance and Sexual Activity  . Alcohol use: Yes    Alcohol/week: 0.0 oz    Comment: occ  . Drug use: No  . Sexual activity: Not on file  Other Topics Concern  . Not on file  Social History Narrative   Retired   Regular exercise- yes   Widowed   At home with children and GKs    HH of 2     Puppy   Plays cards is  social and active     Family History:    Family History  Problem Relation Age of Onset  . Other Mother        fx hip  . Other Sister        fx hip  . Bladder Cancer Brother   . Colon cancer Neg Hx   . Esophageal cancer Neg Hx   . Stomach cancer Neg Hx   . Rectal cancer Neg Hx   . Liver cancer Neg Hx      ROS:  Please see the history of present illness.  No history of stroke or TIA All other ROS reviewed and negative.     Physical Exam/Data:   Vitals:   05/01/17 2305 05/02/17 0150 05/02/17 0356 05/02/17 0515  BP: 139/78 (!) 141/63 140/64   Pulse: (!) 117 69 75   Resp: (!) 24 19 18    Temp:   97.9 F (36.6 C)   TempSrc:   Oral   SpO2: 100% 100% 100%   Weight:    163 lb 5.8 oz (74.1 kg)  Height:    5' (1.524 m)    Intake/Output Summary (Last 24 hours) at 05/02/2017 1404 Last data filed at 05/02/2017 0026 Gross per 24 hour  Intake 500 ml  Output -  Net 500 ml   Filed Weights   05/02/17 0515  Weight: 163 lb 5.8 oz (74.1 kg)   Body mass index is 31.9 kg/m.  General:  Well nourished, well developed, in no acute distress HEENT: normal Lymph: no adenopathy Neck: no JVD Endocrine:  No thryomegaly Vascular: No carotid bruits; FA pulses 2+ bilaterally without bruits  Cardiac:  Irregularly irregular Lungs:  clear  to auscultation bilaterally, no wheezing, rhonchi or rales  Abd: soft, nontender, no hepatomegaly  Ext: no edema Musculoskeletal:  No deformities, BUE and BLE strength normal and equal Skin: warm and dry  Neuro:  CNs 2-12 intact, no focal abnormalities noted Psych:  Normal affect   EKG:  The EKG was personally reviewed and demonstrates:  AF with CVR Telemetry:  Telemetry was personally reviewed and demonstrates:  AF with VR 90  Relevant CV Studies:  Echo 05/02/17- Study Conclusions  - Left ventricle: The cavity size was normal. There was mild   concentric hypertrophy. Systolic function was normal. The   estimated ejection fraction was in the range of 55% to 60%.   Images were inadequate for LV wall motion assessment. The study   is not technically sufficient to allow evaluation of LV diastolic   function. - Mitral valve: Mildly thickened leaflets . Systolic bowing without   prolapse. There was mild regurgitation. - Left atrium: The atrium was normal in size. - Right atrium: The atrium was mildly dilated. - Tricuspid valve: There was mild regurgitation. - Pulmonary arteries: PA peak pressure: 32 mm Hg (S). - Inferior vena cava: The vessel was normal in size. The   respirophasic diameter changes were in the normal range (>= 50%),   consistent with normal central venous pressure.  Impressions:  - LVEF 55-60%, mild LVH, systolic bowing of the mitral valve   without prolapse, mild mitral regurgitation, normal LA size, mild   RAE, mild TR, RVSP 32 mmHg, normal IVC.  Laboratory Data:  Chemistry Recent Labs  Lab 05/01/17 1649 05/02/17 0144  NA 128*  --   K 3.9  --   CL 97*  --   CO2 20*  --   GLUCOSE 94  --   BUN 7  --  CREATININE 0.86 0.83  CALCIUM 9.5  --   GFRNONAA >60 >60  GFRAA >60 >60  ANIONGAP 11  --     Recent Labs  Lab 05/01/17 1649  PROT 7.7  ALBUMIN 4.4  AST 30  ALT 24  ALKPHOS 58  BILITOT 0.4   Hematology Recent Labs  Lab 05/01/17 1649  05/02/17 0144  WBC 6.5 6.8  RBC 4.26 3.99  HGB 13.7 12.9  HCT 39.2 35.9*  MCV 92.0 90.0  MCH 32.2 32.3  MCHC 34.9 35.9  RDW 12.5 12.6  PLT 283 257   Cardiac EnzymesNo results for input(s): TROPONINI in the last 168 hours.  Recent Labs  Lab 05/01/17 2308  TROPIPOC 0.00    BNPNo results for input(s): BNP, PROBNP in the last 168 hours.  DDimer No results for input(s): DDIMER in the last 168 hours.  Radiology/Studies:  Ct Head Wo Contrast  Result Date: 05/01/2017 CLINICAL DATA:  Dizziness. EXAM: CT HEAD WITHOUT CONTRAST TECHNIQUE: Contiguous axial images were obtained from the base of the skull through the vertex without intravenous contrast. COMPARISON:  None FINDINGS: Brain: There is atrophy and chronic small vessel disease changes. No acute intracranial abnormality. Specifically, no hemorrhage, hydrocephalus, mass lesion, acute infarction, or significant intracranial injury. Vascular: No hyperdense vessel or unexpected calcification. Skull: No acute calvarial abnormality. Sinuses/Orbits: Visualized paranasal sinuses and mastoids clear. Orbital soft tissues unremarkable. Other: None IMPRESSION: No acute intracranial abnormality. Atrophy, chronic microvascular disease. Electronically Signed   By: Rolm Baptise M.D.   On: 05/01/2017 18:34   Ct Abdomen Pelvis W Contrast  Result Date: 05/01/2017 CLINICAL DATA:  Abdominal pain, acute, generalized. Nausea, vomiting, weakness and dizziness. EXAM: CT ABDOMEN AND PELVIS WITH CONTRAST TECHNIQUE: Multidetector CT imaging of the abdomen and pelvis was performed using the standard protocol following bolus administration of intravenous contrast. CONTRAST:  146m ISOVUE-300 IOPAMIDOL (ISOVUE-300) INJECTION 61% COMPARISON:  CT abdomen dated 04/04/2016. FINDINGS: Lower chest: No acute abnormality. Hepatobiliary: No focal liver abnormality is seen. No gallstones, gallbladder wall thickening, or biliary dilatation. Pancreas: Unremarkable. No pancreatic ductal  dilatation or surrounding inflammatory changes. Spleen: Normal in size without focal abnormality. Adrenals/Urinary Tract: Adrenal glands appear normal. Tiny left renal cysts, as also described on previous MRI. No suspicious mass, stone or hydronephrosis bilaterally. No perinephric inflammation. No ureteral or bladder calculi identified. Bladder appears normal. Stomach/Bowel: Bowel is normal in caliber. No bowel wall thickening or evidence of bowel wall inflammation seen. Appendix is normal. Stomach is unremarkable, partially decompressed. Scattered diverticulosis within the sigmoid and descending colon without evidence of acute diverticulitis. Vascular/Lymphatic: Aortic atherosclerosis. No enlarged abdominal or pelvic lymph nodes. Reproductive: Status post hysterectomy. No adnexal masses. Other: No free fluid or abscess collection. No free intraperitoneal air. Musculoskeletal: Degenerative changes throughout the thoracolumbar spine, mild to moderate in degree. No acute or suspicious osseous finding. Superficial soft tissues are unremarkable. IMPRESSION: 1. No acute findings within the abdomen or pelvis. No bowel obstruction or evidence of bowel wall inflammation. No free fluid. No evidence of acute solid organ abnormality. No evidence of pyelonephritis. No renal or ureteral calculi. Appendix is normal. 2. Colonic diverticulosis without evidence of acute diverticulitis. 3. Aortic atherosclerosis. Electronically Signed   By: SFranki CabotM.D.   On: 05/01/2017 20:43   Dg Chest Port 1 View  Result Date: 05/02/2017 CLINICAL DATA:  84y/o  F; tachycardia. EXAM: PORTABLE CHEST 1 VIEW COMPARISON:  02/06/2016 chest radiograph FINDINGS: Normal cardiac silhouette. Aortic atherosclerosis with calcification. Clear lungs. No pleural effusion or  pneumothorax. No acute osseous abnormality is evident. IMPRESSION: No active disease. Electronically Signed   By: Kristine Garbe M.D.   On: 05/02/2017 01:26    Assessment  and Plan:   PAF New onset, she was admitted for weakness but is otherwise unaware of her AF  H/O IBS  H/O UTI  Plan: Add beta blocker. CHADS VASC=3 for age, sex. Consider adding Eliquis. TSH was normal. MD to see.    For questions or updates, please contact [place] Please consult [email or web address] for contact info under Cardiology/STEMI.   Signed, Kerin Ransom, PA-C  05/02/2017 2:04 PM   I have personally seen and examined this patient. I agree with the assessment and plan as outlined above.  She is admitted with atrial fib. Telemetry and EKG reviewed by me and confirms atrial fibrillation Rate is controlled today. She has no awareness of palpitations. CHADSVASC score of 3.  My exam;  General: Well developed, well nourished, NAD  HEENT: OP clear, mucus membranes moist  SKIN: warm, dry. No rashes. Neuro: No focal deficits  Musculoskeletal: Muscle strength 5/5 all ext  Psychiatric: Mood and affect normal  Neck: No JVD, no carotid bruits, no thyromegaly, no lymphadenopathy.  Lungs:Clear bilaterally, no wheezes, rhonci, crackles Cardiovascular: Irreg irreg. No murmurs, gallops or rubs. Abdomen:Soft. Bowel sounds present. Non-tender.  Extremities: No lower extremity edema. Pulses are 2 + in the bilateral DP/PT. Plan:  1. PAF: I have reviewed the etiology and treatment options for atrial fib including risk of CVA. Given her stroke risk, would start Eliquis 5 mg po BID. Will start Lopressor 25 mg po BID.   Lauree Chandler 05/02/2017 2:46 PM

## 2017-05-03 DIAGNOSIS — I4891 Unspecified atrial fibrillation: Secondary | ICD-10-CM | POA: Diagnosis not present

## 2017-05-03 LAB — BASIC METABOLIC PANEL
Anion gap: 7 (ref 5–15)
BUN: 8 mg/dL (ref 6–20)
CHLORIDE: 108 mmol/L (ref 101–111)
CO2: 20 mmol/L — AB (ref 22–32)
CREATININE: 0.74 mg/dL (ref 0.44–1.00)
Calcium: 8.8 mg/dL — ABNORMAL LOW (ref 8.9–10.3)
GFR calc non Af Amer: >60 mL/min (ref >60)
Glucose, Bld: 77 mg/dL (ref 65–99)
POTASSIUM: 4 mmol/L (ref 3.5–5.1)
SODIUM: 135 mmol/L (ref 135–145)

## 2017-05-03 MED ORDER — METOPROLOL TARTRATE 25 MG PO TABS: 25.0000 mg | ORAL_TABLET | Freq: Two times a day (BID) | ORAL | 0 refills | Status: DC

## 2017-05-03 MED ORDER — APIXABAN 5 MG PO TABS: 5.0000 mg | ORAL_TABLET | Freq: Two times a day (BID) | ORAL | 0 refills | Status: DC

## 2017-05-03 NOTE — Discharge Instructions (Signed)

## 2017-05-03 NOTE — Care Management Obs Status (Signed)
[place] NOTIFICATION   Patient Details  Name: SHINIKA ESTELLE MRN: [id number] Date of Birth: 1932/12/08   Medicare Observation Status Notification Given:  Yes    Erenest Rasher, RN 05/03/2017, 12:08 PM

## 2017-05-03 NOTE — Progress Notes (Signed)
Pt discharged to home. DC instructions given with dtr and female family member at bedside. No concerns voiced. Pt and family encouraged to stop by pharmacy and pick up meds that were e=prescribed by MD. Both voiced understanding. Left in wheelchair pushed by nurse tech accompanied by family members. Left in stable condition. Ambulated about 300 feet without difficulties prior to discharge.  [name].

## 2017-05-03 NOTE — Care Management CC44 (Signed)
Condition Code 44 Documentation Completed  Patient Details  Name: Ashley Lawson MRN: [id number] Date of Birth: 10/13/1932   Condition Code 44 given:  Yes Patient signature on Condition Code 44 notice:  Yes Documentation of 2 MD's agreement:  Yes Code 44 added to claim:  Yes    Erenest Rasher, RN 05/03/2017, 12:08 PM

## 2017-05-03 NOTE — Progress Notes (Signed)
Progress Note  Patient Name: Ashley Lawson Date of Encounter: 05/03/2017  Primary Cardiologist:   New (Dr. Angelena Form)   Subjective   No chest pain.  No SOB.  She slept well.    Inpatient Medications    Scheduled Meds: . apixaban  5 mg Oral BID  . cycloSPORINE  1 drop Both Eyes BID  . metoprolol tartrate  25 mg Oral BID  . polyethylene glycol  17 g Oral Daily   Continuous Infusions:  PRN Meds: acetaminophen, fluticasone, HYDROcodone-acetaminophen, loratadine, morphine injection, ondansetron (ZOFRAN) IV, sodium chloride, zolpidem   Vital Signs    Vitals:   05/02/17 0515 05/02/17 1421 05/02/17 2220 05/03/17 0621  BP:  118/68 (!) 111/58 136/77  Pulse:  91 69 74  Resp:  14 16 18   Temp:  97.7 F (36.5 C) 98.1 F (36.7 C) 97.7 F (36.5 C)  TempSrc:  Oral Oral Oral  SpO2:  97% 97% 97%  Weight: 163 lb 5.8 oz (74.1 kg)     Height: 5' (1.524 m)       Intake/Output Summary (Last 24 hours) at 05/03/2017 0830 Last data filed at 05/02/2017 2330 Gross per 24 hour  Intake 1244.17 ml  Output -  Net 1244.17 ml   Filed Weights   05/02/17 0515  Weight: 163 lb 5.8 oz (74.1 kg)    Telemetry    NSR, converted last night from atrial fib - Personally Reviewed  ECG    NA - Personally Reviewed  Physical Exam   GEN: No acute distress.   Neck: No  JVD Cardiac: RRR,  murmurs, rubs, or gallops.  Respiratory: Clear  to auscultation bilaterally. GI: Soft, nontender, non-distended  MS: No  edema; No deformity. Neuro:  Nonfocal  Psych: Normal affect   Labs    Chemistry Recent Labs  Lab 05/01/17 1649 05/02/17 0144 05/03/17 0449  NA 128*  --  135  K 3.9  --  4.0  CL 97*  --  108  CO2 20*  --  20*  GLUCOSE 94  --  77  BUN 7  --  8  CREATININE 0.86 0.83 0.74  CALCIUM 9.5  --  8.8*  PROT 7.7  --   --   ALBUMIN 4.4  --   --   AST 30  --   --   ALT 24  --   --   ALKPHOS 58  --   --   BILITOT 0.4  --   --   GFRNONAA >60 >60 >60  GFRAA >60 >60 >60  ANIONGAP 11  --  7      Hematology Recent Labs  Lab 05/01/17 1649 05/02/17 0144  WBC 6.5 6.8  RBC 4.26 3.99  HGB 13.7 12.9  HCT 39.2 35.9*  MCV 92.0 90.0  MCH 32.2 32.3  MCHC 34.9 35.9  RDW 12.5 12.6  PLT 283 257    Cardiac EnzymesNo results for input(s): TROPONINI in the last 168 hours.  Recent Labs  Lab 05/01/17 2308  TROPIPOC 0.00     BNPNo results for input(s): BNP, PROBNP in the last 168 hours.   DDimer No results for input(s): DDIMER in the last 168 hours.   Radiology    Ct Head Wo Contrast  Result Date: 05/01/2017 CLINICAL DATA:  Dizziness. EXAM: CT HEAD WITHOUT CONTRAST TECHNIQUE: Contiguous axial images were obtained from the base of the skull through the vertex without intravenous contrast. COMPARISON:  None FINDINGS: Brain: There is atrophy and chronic small vessel  disease changes. No acute intracranial abnormality. Specifically, no hemorrhage, hydrocephalus, mass lesion, acute infarction, or significant intracranial injury. Vascular: No hyperdense vessel or unexpected calcification. Skull: No acute calvarial abnormality. Sinuses/Orbits: Visualized paranasal sinuses and mastoids clear. Orbital soft tissues unremarkable. Other: None IMPRESSION: No acute intracranial abnormality. Atrophy, chronic microvascular disease. Electronically Signed   By: Rolm Baptise M.D.   On: 05/01/2017 18:34   Ct Abdomen Pelvis W Contrast  Result Date: 05/01/2017 CLINICAL DATA:  Abdominal pain, acute, generalized. Nausea, vomiting, weakness and dizziness. EXAM: CT ABDOMEN AND PELVIS WITH CONTRAST TECHNIQUE: Multidetector CT imaging of the abdomen and pelvis was performed using the standard protocol following bolus administration of intravenous contrast. CONTRAST:  117m ISOVUE-300 IOPAMIDOL (ISOVUE-300) INJECTION 61% COMPARISON:  CT abdomen dated 04/04/2016. FINDINGS: Lower chest: No acute abnormality. Hepatobiliary: No focal liver abnormality is seen. No gallstones, gallbladder wall thickening, or biliary  dilatation. Pancreas: Unremarkable. No pancreatic ductal dilatation or surrounding inflammatory changes. Spleen: Normal in size without focal abnormality. Adrenals/Urinary Tract: Adrenal glands appear normal. Tiny left renal cysts, as also described on previous MRI. No suspicious mass, stone or hydronephrosis bilaterally. No perinephric inflammation. No ureteral or bladder calculi identified. Bladder appears normal. Stomach/Bowel: Bowel is normal in caliber. No bowel wall thickening or evidence of bowel wall inflammation seen. Appendix is normal. Stomach is unremarkable, partially decompressed. Scattered diverticulosis within the sigmoid and descending colon without evidence of acute diverticulitis. Vascular/Lymphatic: Aortic atherosclerosis. No enlarged abdominal or pelvic lymph nodes. Reproductive: Status post hysterectomy. No adnexal masses. Other: No free fluid or abscess collection. No free intraperitoneal air. Musculoskeletal: Degenerative changes throughout the thoracolumbar spine, mild to moderate in degree. No acute or suspicious osseous finding. Superficial soft tissues are unremarkable. IMPRESSION: 1. No acute findings within the abdomen or pelvis. No bowel obstruction or evidence of bowel wall inflammation. No free fluid. No evidence of acute solid organ abnormality. No evidence of pyelonephritis. No renal or ureteral calculi. Appendix is normal. 2. Colonic diverticulosis without evidence of acute diverticulitis. 3. Aortic atherosclerosis. Electronically Signed   By: SFranki CabotM.D.   On: 05/01/2017 20:43   Dg Chest Port 1 View  Result Date: 05/02/2017 CLINICAL DATA:  84y/o  F; tachycardia. EXAM: PORTABLE CHEST 1 VIEW COMPARISON:  02/06/2016 chest radiograph FINDINGS: Normal cardiac silhouette. Aortic atherosclerosis with calcification. Clear lungs. No pleural effusion or pneumothorax. No acute osseous abnormality is evident. IMPRESSION: No active disease. Electronically Signed   By: LKristine GarbeM.D.   On: 05/02/2017 01:26    Cardiac Studies   ECHO:  05/02/17   - Left ventricle: The cavity size was normal. There was mild   concentric hypertrophy. Systolic function was normal. The   estimated ejection fraction was in the range of 55% to 60%.   Images were inadequate for LV wall motion assessment. The study   is not technically sufficient to allow evaluation of LV diastolic   function. - Mitral valve: Mildly thickened leaflets . Systolic bowing without   prolapse. There was mild regurgitation. - Left atrium: The atrium was normal in size. - Right atrium: The atrium was mildly dilated. - Tricuspid valve: There was mild regurgitation. - Pulmonary arteries: PA peak pressure: 32 mm Hg (S). - Inferior vena cava: The vessel was normal in size. The   respirophasic diameter changes were in the normal range (>= 50%),   consistent with normal central venous pressure.  Patient Profile     84y.o. female with nor prior cardiac issues who is  being seen for the evaluation of PAF at the request of Rodena Piety.   Assessment & Plan    ATRIAL FIB WITH RVR:   She was started on Eliquis and Metoprolol.   Echo as above with mild MV abnormality.    Continue the beta blocker and blood thinner.  We will see her in our office in about 1 month.    For questions or updates, please contact [place] Please consult [email or web address] for contact info under Cardiology/STEMI.   Signed, Minus Breeding, MD  05/03/2017, 8:30 AM

## 2017-05-03 NOTE — Care Management Note (Signed)
Case Management Note  Patient Details  Name: Ashley Lawson MRN: [id number] Date of Birth: 06-25-1932  Subjective/Objective:    afib with RVR                Action/Plan: Spoke to pt and dtr/son at bedside. Pt states she was independent prior to hospital stay. Contacted her pharmacy and they do have Eliquis in stock. Pharmacist provided with 30 day free trial card. Her copay is $20 for Eliquis after free trial.   Expected Discharge Date:  05/03/17               Expected Discharge Plan:  Home/Self Care  In-House Referral:  NA  Discharge planning Services  CM Consult, Medication Assistance  Post Acute Care Choice:  NA Choice offered to:  NA  DME Arranged:  N/A DME Agency:  NA  HH Arranged:  NA HH Agency:  NA  Status of Service:  Completed, signed off  If discussed at [place] of Stay Meetings, dates discussed:    Additional Comments:  Erenest Rasher, RN 05/03/2017, 12:26 PM

## 2017-05-03 NOTE — Discharge Summary (Signed)
Physician Discharge Summary  Ashley Lawson PPJ:[id number] DOB: September 09, 1932 DOA: 05/01/2017  PCP: Burnis Medin, MD  Admit date: 05/01/2017 Discharge date: 05/03/2017  Admitted From: Home Disposition: Home Recommendations for Outpatient Follow-up:  1. Follow up with PCP in 1-2 weeks 2. Please obtain BMP/CBC in one week  Home Health none Equipment/Devices none  Discharge Condition: Stable CODE STATUS: Full code Diet recommendation: Cardiac Brief/Interim Summary: 84 y.o.femalewith past medical history significant for hyperlipidemia, irritable bowel presents to the emergency room with chief complaint of dizziness and wobbly legs. Patient had sudden onset of symptoms today. However she bent over to touch her toes. Patient was recently treated with Bactrim for UTI. States she feels that she completed her treatment. Denies any fever chills. No other symptoms. Has been drinking well.  ED course: Found to be in A. fib with RVR. Given Lopressor. Hospitalist consulted for admission.    Discharge Diagnoses:  Principal Problem:   Atrial fibrillation with RVR ([place])  1]New onset atrial fibrillation-patient was admitted to telemetry for started on metoprolol tartrate 25 mg twice a day.  She was also started on Eliquis 5 mg twice a day for stroke prophylaxis.  She was seen in consultation by cardiology and she has a follow-up appointment with [facility] [place].  TSH was normal. echocardiogram showedLVEF 55-60%, mild LVH, systolic bowing of the mitral valve   without prolapse, mild mitral regurgitation, normal LA size, mild   RAE, mild TR, RVSP 32 mmHg, normal IVC.  2] chronic hyponatremia has been stable sodium 135 upon discharge after IV hydration.  With normal saline.   Discharge Instructions  Discharge Instructions    Call MD for:  difficulty breathing, headache or visual disturbances   Complete by:  As directed    Call MD for:  persistant nausea and vomiting   Complete by:  As  directed    Diet - low sodium heart healthy   Complete by:  As directed    Increase activity slowly   Complete by:  As directed      Allergies as of 05/03/2017      Reactions   Alendronate Sodium    upset stomach   Penicillins    Has patient had a PCN reaction causing immediate rash, facial/tongue/throat swelling, SOB or lightheadedness with hypotension: Yes Has patient had a PCN reaction causing severe rash involving mucus membranes or skin necrosis: No Has patient had a PCN reaction that required hospitalization: No Has patient had a PCN reaction occurring within the last 10 years: No If all of the above answers are "NO", then may proceed with Cephalosporin use.   Amoxicillin Rash   Has patient had a PCN reaction causing immediate rash, facial/tongue/throat swelling, SOB or lightheadedness with hypotension: yes Has patient had a PCN reaction causing severe rash involving mucus membranes or skin necrosis: no Has patient had a PCN reaction that required hospitalization: no Has patient had PCN reaction within the last 10 years: no  If all of the above answers are "NO", then may proceed with Cephalosporin use. REACTION: unspecified   Metronidazole Other (See Comments)   Pounding headaches patient says was bad.  With cipro for diverticulitis rx. Other reaction(s): Other (See Comments) Pounding headaches patient says was bad.With cipro for diverticulitis rx.      Medication List    STOP taking these medications   AMBULATORY NON FORMULARY MEDICATION   phenazopyridine 200 MG tablet Commonly known as:  PYRIDIUM   sulfamethoxazole-trimethoprim 800-160 MG tablet Commonly known as:  BACTRIM  DS,SEPTRA DS     TAKE these medications   acetaminophen 500 MG tablet Commonly known as:  TYLENOL Take 500 mg by mouth every 6 (six) hours as needed for mild pain, moderate pain or headache.   ALIGN PO Take 1 tablet by mouth daily.   apixaban 5 MG Tabs tablet Commonly known as:   ELIQUIS Take 1 tablet (5 mg total) by mouth 2 (two) times daily.   calcium carbonate 1250 (500 Ca) MG chewable tablet Commonly known as:  OS-CAL Chew 1 tablet by mouth 2 (two) times daily.   fish oil-omega-3 fatty acids 1000 MG capsule Take 1 g by mouth daily.   fluticasone 50 MCG/ACT nasal spray Commonly known as:  FLONASE 2 spray each nostril qd What changed:    how much to take  how to take this  when to take this  reasons to take this  additional instructions   metoprolol tartrate 25 MG tablet Commonly known as:  LOPRESSOR Take 1 tablet (25 mg total) by mouth 2 (two) times daily.   MULTIPLE VITAMIN PO Take 1 tablet by mouth daily.   polyethylene glycol packet Commonly known as:  MIRALAX / GLYCOLAX Take 17 g by mouth daily.   PREMARIN vaginal cream Generic drug:  conjugated estrogens once a week.   RESTASIS 0.05 % ophthalmic emulsion Generic drug:  cycloSPORINE Place 1 drop into both eyes 2 (two) times daily.   traMADol 50 MG tablet Commonly known as:  ULTRAM Take 1 tablet (50 mg total) every 6 (six) hours as needed by mouth.   vitamin C 100 MG tablet Take 100 mg by mouth daily.      Follow-up Information    Charlie Pitter, PA-C Follow up on 05/28/2017.   Specialties:  Cardiology, Radiology Why:  10:00 - Dr. Camillia Herter PA Contact information: [address] [place] [address] [phone]          Allergies  Allergen Reactions  . Alendronate Sodium     upset stomach  . Penicillins     Has patient had a PCN reaction causing immediate rash, facial/tongue/throat swelling, SOB or lightheadedness with hypotension: Yes Has patient had a PCN reaction causing severe rash involving mucus membranes or skin necrosis: No Has patient had a PCN reaction that required hospitalization: No Has patient had a PCN reaction occurring within the last 10 years: No If all of the above answers are "NO", then may proceed with Cephalosporin  use.   Marland Kitchen Amoxicillin Rash    Has patient had a PCN reaction causing immediate rash, facial/tongue/throat swelling, SOB or lightheadedness with hypotension: yes Has patient had a PCN reaction causing severe rash involving mucus membranes or skin necrosis: no Has patient had a PCN reaction that required hospitalization: no Has patient had PCN reaction within the last 10 years: no  If all of the above answers are "NO", then may proceed with Cephalosporin use.  REACTION: unspecified  . Metronidazole Other (See Comments)    Pounding headaches patient says was bad.  With cipro for diverticulitis rx. Other reaction(s): Other (See Comments) Pounding headaches patient says was bad.With cipro for diverticulitis rx.    Consultations:  Cardiology   Procedures/Studies: Ct Head Wo Contrast  Result Date: 05/01/2017 CLINICAL DATA:  Dizziness. EXAM: CT HEAD WITHOUT CONTRAST TECHNIQUE: Contiguous axial images were obtained from the base of the skull through the vertex without intravenous contrast. COMPARISON:  None FINDINGS: Brain: There is atrophy and chronic small vessel disease changes. No  acute intracranial abnormality. Specifically, no hemorrhage, hydrocephalus, mass lesion, acute infarction, or significant intracranial injury. Vascular: No hyperdense vessel or unexpected calcification. Skull: No acute calvarial abnormality. Sinuses/Orbits: Visualized paranasal sinuses and mastoids clear. Orbital soft tissues unremarkable. Other: None IMPRESSION: No acute intracranial abnormality. Atrophy, chronic microvascular disease. Electronically Signed   By: Rolm Baptise M.D.   On: 05/01/2017 18:34   Ct Abdomen Pelvis W Contrast  Result Date: 05/01/2017 CLINICAL DATA:  Abdominal pain, acute, generalized. Nausea, vomiting, weakness and dizziness. EXAM: CT ABDOMEN AND PELVIS WITH CONTRAST TECHNIQUE: Multidetector CT imaging of the abdomen and pelvis was performed using the standard protocol following bolus  administration of intravenous contrast. CONTRAST:  13m ISOVUE-300 IOPAMIDOL (ISOVUE-300) INJECTION 61% COMPARISON:  CT abdomen dated 04/04/2016. FINDINGS: Lower chest: No acute abnormality. Hepatobiliary: No focal liver abnormality is seen. No gallstones, gallbladder wall thickening, or biliary dilatation. Pancreas: Unremarkable. No pancreatic ductal dilatation or surrounding inflammatory changes. Spleen: Normal in size without focal abnormality. Adrenals/Urinary Tract: Adrenal glands appear normal. Tiny left renal cysts, as also described on previous MRI. No suspicious mass, stone or hydronephrosis bilaterally. No perinephric inflammation. No ureteral or bladder calculi identified. Bladder appears normal. Stomach/Bowel: Bowel is normal in caliber. No bowel wall thickening or evidence of bowel wall inflammation seen. Appendix is normal. Stomach is unremarkable, partially decompressed. Scattered diverticulosis within the sigmoid and descending colon without evidence of acute diverticulitis. Vascular/Lymphatic: Aortic atherosclerosis. No enlarged abdominal or pelvic lymph nodes. Reproductive: Status post hysterectomy. No adnexal masses. Other: No free fluid or abscess collection. No free intraperitoneal air. Musculoskeletal: Degenerative changes throughout the thoracolumbar spine, mild to moderate in degree. No acute or suspicious osseous finding. Superficial soft tissues are unremarkable. IMPRESSION: 1. No acute findings within the abdomen or pelvis. No bowel obstruction or evidence of bowel wall inflammation. No free fluid. No evidence of acute solid organ abnormality. No evidence of pyelonephritis. No renal or ureteral calculi. Appendix is normal. 2. Colonic diverticulosis without evidence of acute diverticulitis. 3. Aortic atherosclerosis. Electronically Signed   By: SFranki CabotM.D.   On: 05/01/2017 20:43   Dg Chest Port 1 View  Result Date: 05/02/2017 CLINICAL DATA:  84y/o  F; tachycardia. EXAM: PORTABLE  CHEST 1 VIEW COMPARISON:  02/06/2016 chest radiograph FINDINGS: Normal cardiac silhouette. Aortic atherosclerosis with calcification. Clear lungs. No pleural effusion or pneumothorax. No acute osseous abnormality is evident. IMPRESSION: No active disease. Electronically Signed   By: LKristine GarbeM.D.   On: 05/02/2017 01:26   Ct Maxillofacial Wo Contrast  Result Date: 04/14/2017 CLINICAL DATA:  Chronic sinusitis. Right-sided pain and swelling. Possible hearing loss. EXAM: CT MAXILLOFACIAL WITHOUT CONTRAST TECHNIQUE: Multidetector CT images of the paranasal sinuses were obtained using the standard protocol without intravenous contrast. COMPARISON:  None. FINDINGS: Paranasal sinuses: Frontal: Normally aerated. Patent frontal sinus drainage pathways. Ethmoid: Normally aerated. Maxillary: Normally aerated. Sphenoid: Normally aerated. Patent sphenoethmoidal recesses. Right ostiomeatal unit: Infundibulum widely patent. No unfavorable variant. Left ostiomeatal unit: Infundibulum widely patent. No unfavorable variant. Large nasoantral window. Nasal passages: Patent. Intact nasal septum is midline. Anatomy: Extensive pneumatization superior to the anterior ethmoid notches. Symmetric and intact olfactory grooves and fovea ethmoidalis, Keros III (8-164m Sellar sphenoid pneumatization pattern. No dehiscence of carotid or optic canals. No onodi cell. Other: Other regional soft tissues appear normal. No fluid in the middle ears or mastoids. CP angle regions appear normal as seen. IMPRESSION: Negative examination. No evidence of sinus inflammatory disease or other cause the presenting symptoms. Electronically Signed  By: Nelson Chimes M.D.   On: 04/14/2017 08:40    (Echo, Carotid, EGD, Colonoscopy, ERCP)    Subjective:   Discharge Exam: Vitals:   05/02/17 2220 05/03/17 0621  BP: (!) 111/58 136/77  Pulse: 69 74  Resp: 16 18  Temp: 98.1 F (36.7 C) 97.7 F (36.5 C)  SpO2: 97% 97%   Vitals:    05/02/17 0515 05/02/17 1421 05/02/17 2220 05/03/17 0621  BP:  118/68 (!) 111/58 136/77  Pulse:  91 69 74  Resp:  14 16 18   Temp:  97.7 F (36.5 C) 98.1 F (36.7 C) 97.7 F (36.5 C)  TempSrc:  Oral Oral Oral  SpO2:  97% 97% 97%  Weight: 74.1 kg (163 lb 5.8 oz)     Height: 5' (1.524 m)       General: Pt is alert, awake, not in acute distress Cardiovascular: RRR, S1/S2 +, no rubs, no gallops Respiratory: CTA bilaterally, no wheezing, no rhonchi Abdominal: Soft, NT, ND, bowel sounds + Extremities: no edema, no cyanosis    The results of significant diagnostics from this hospitalization (including imaging, microbiology, ancillary and laboratory) are listed below for reference.     Microbiology: No results found for this or any previous visit (from the past 240 hour(s)).   Labs: BNP (last 3 results) No results for input(s): BNP in the last 8760 hours. Basic Metabolic Panel: Recent Labs  Lab 05/01/17 1649 05/02/17 0144 05/03/17 0449  NA 128*  --  135  K 3.9  --  4.0  CL 97*  --  108  CO2 20*  --  20*  GLUCOSE 94  --  77  BUN 7  --  8  CREATININE 0.86 0.83 0.74  CALCIUM 9.5  --  8.8*  MG 2.1  --   --   PHOS 2.9  --   --    Liver Function Tests: Recent Labs  Lab 05/01/17 1649  AST 30  ALT 24  ALKPHOS 58  BILITOT 0.4  PROT 7.7  ALBUMIN 4.4   Recent Labs  Lab 05/01/17 1649  LIPASE 33   No results for input(s): AMMONIA in the last 168 hours. CBC: Recent Labs  Lab 05/01/17 1649 05/02/17 0144  WBC 6.5 6.8  HGB 13.7 12.9  HCT 39.2 35.9*  MCV 92.0 90.0  PLT 283 257   Cardiac Enzymes: No results for input(s): CKTOTAL, CKMB, CKMBINDEX, TROPONINI in the last 168 hours. BNP: Invalid input(s): POCBNP CBG: Recent Labs  Lab 05/01/17 1831  GLUCAP 86   D-Dimer No results for input(s): DDIMER in the last 72 hours. Hgb A1c No results for input(s): HGBA1C in the last 72 hours. Lipid Profile No results for input(s): CHOL, HDL, LDLCALC, TRIG, CHOLHDL,  LDLDIRECT in the last 72 hours. Thyroid function studies No results for input(s): TSH, T4TOTAL, T3FREE, THYROIDAB in the last 72 hours.  Invalid input(s): FREET3 Anemia work up No results for input(s): VITAMINB12, FOLATE, FERRITIN, TIBC, IRON, RETICCTPCT in the last 72 hours. Urinalysis    Component Value Date/Time   COLORURINE STRAW (A) 05/01/2017 1743   APPEARANCEUR CLEAR 05/01/2017 1743   LABSPEC 1.004 (L) 05/01/2017 1743   PHURINE 7.0 05/01/2017 1743   GLUCOSEU NEGATIVE 05/01/2017 1743   HGBUR SMALL (A) 05/01/2017 1743   HGBUR 2+ 08/01/2009 1508   BILIRUBINUR NEGATIVE 05/01/2017 1743   BILIRUBINUR n 04/03/2017 0909   KETONESUR NEGATIVE 05/01/2017 1743   PROTEINUR NEGATIVE 05/01/2017 1743   UROBILINOGEN 0.2 04/03/2017 0909   UROBILINOGEN 0.2  01/08/2010 1831   NITRITE NEGATIVE 05/01/2017 1743   LEUKOCYTESUR NEGATIVE 05/01/2017 1743   Sepsis Labs Invalid input(s): PROCALCITONIN,  WBC,  LACTICIDVEN Microbiology No results found for this or any previous visit (from the past 240 hour(s)).   Time coordinating discharge: Over 30 minutes  SIGNED:   Georgette Shell, MD  [facility] 05/03/2017, 10:46 AM  If 7PM-7AM, please contact night-coverage [email or web address] Password [email or web address]

## 2017-05-04 NOTE — Telephone Encounter (Signed)
decision is up to patient  No rush.  But I agree would not start new medications at this t time Since she was in hospital for a fib .   In th past few weeks . Also her calcium level should be stable before beginning any new medication.    I advise / consider  to see endocrinologist such as Dr Cruzita Lederer for  opinion about risk benefit of this or other  Medications interventions . And have daughter attend the visit   Ask her if she wishes referral  To endocrinology for dx osteoporosis and we can do the referral .

## 2017-05-05 ENCOUNTER — Telehealth: Payer: Self-pay | Admitting: *Deleted

## 2017-05-05 NOTE — Telephone Encounter (Signed)
Unable to reach patient at time of TCM Call. Left message for patient to return call when available.  

## 2017-05-05 NOTE — Telephone Encounter (Signed)
Pt stated she would "hold off" on the referral and will discuss in person with Dr Regis Bill on her next OV

## 2017-05-05 NOTE — Telephone Encounter (Signed)
Sounds like a good plan

## 2017-05-06 NOTE — Telephone Encounter (Signed)
Patient scheduled as directed.

## 2017-05-06 NOTE — Telephone Encounter (Signed)
Please advise when patient can be scheduled for TCM appointment.  Needs to be scheduled on or before 05/16/17 and patient prefers afternoon appt.

## 2017-05-06 NOTE — Telephone Encounter (Signed)
How about  4 pm  Or 345   February 13 Wednesday

## 2017-05-06 NOTE — Telephone Encounter (Signed)
Transition Care Management Follow-up Telephone Call  Per Discharge Summary: Admit date: 05/01/2017 Discharge date: 05/03/2017  Admitted From: Home Disposition: Home Recommendations for Outpatient Follow-up:  1. Follow up with PCP in 1-2 weeks 2. Please obtain BMP/CBC in one week  Home Health none Equipment/Devices none  Discharge Condition: Stable CODE STATUS: Full code Diet recommendation: Cardiac Brief/Interim Summary:84 y.o.femalewith past medical history significant for hyperlipidemia, irritable bowel presents to the emergency room with chief complaint of dizziness and wobbly legs. Patient had sudden onset of symptoms today. However she bent over to touch her toes. Patient was recently treated with Bactrim for UTI. States she feels that she completed her treatment. Denies any fever chills. No other symptoms. Has been drinking well.  ED course: Found to be in A. fib with RVR. Given Lopressor. Hospitalist consulted for admission.    Discharge Diagnoses:  Principal Problem:  --   How have you been since you were released from the hospital? "I've been doing okay. I was kind of tired yesterday but I think I'm going to be better today. I got out and walked my dog yesterday and I'm going to walk him again today."   Do you understand why you were in the hospital? yes   Do you understand the discharge instructions? yes   Where were you discharged to? Home   Items Reviewed:  Medications reviewed: yes  Allergies reviewed: yes  Dietary changes reviewed: yes  Referrals reviewed: yes   Functional Questionnaire:   Activities of Daily Living (ADLs):   She states they are independent in the following: ambulation, bathing and hygiene, feeding, continence, grooming, toileting and dressing States they require assistance with the following: none   Any transportation issues/concerns?: no   Any patient concerns? yes   Confirmed importance and date/time of  follow-up visits scheduled no, unable to find an appt time that works for patient and provider's schedule.  Confirmed with patient if condition begins to worsen call PCP or go to the ER.  Patient was given the office number and encouraged to call back with question or concerns.  : yes

## 2017-05-08 DIAGNOSIS — M6281 Muscle weakness (generalized): Secondary | ICD-10-CM | POA: Diagnosis not present

## 2017-05-08 DIAGNOSIS — R102 Pelvic and perineal pain: Secondary | ICD-10-CM | POA: Diagnosis not present

## 2017-05-08 DIAGNOSIS — R3982 Chronic bladder pain: Secondary | ICD-10-CM | POA: Diagnosis not present

## 2017-05-08 DIAGNOSIS — R350 Frequency of micturition: Secondary | ICD-10-CM | POA: Diagnosis not present

## 2017-05-08 DIAGNOSIS — M62838 Other muscle spasm: Secondary | ICD-10-CM | POA: Diagnosis not present

## 2017-05-11 NOTE — Progress Notes (Signed)
Chief Complaint  Patient presents with  . Hospitalization Follow-up    Adm to hospital 05/01/17. Pt started on Eliquis.     HPI:  Ashley Lawson 84 y.o. come in for Chronic disease management   FU   Hospital    Atrial fibrilliation   And placed on   Anticoagulation and b blocker  Has mild low sodium  . Has fu cards  In 2 weeks    .    Had weakness feeling  In leg and head heart and wobbly  Wasn't aware of fast heart rate .   Since then getting  PT for UI bladder dysfunction some help a bit sore on left.   Family concern about  prolia  Dental ?   Not starting  ROS: See pertinent positives and negatives per HPI. Some frequency   Past Medical History:  Diagnosis Date  . Abdominal pain 12/2009   hospitalized  . Allergic rhinitis   . Chronic constipation   . Compression fx, lumbar spine ([place])   . DDD (degenerative disc disease), lumbar   . Diverticulosis of colon   . Dog bite(E906.0) 08/30/2011  . GERD (gastroesophageal reflux disease)   . Hepatic steatosis   . Hyperlipidemia   . Hyperplastic colon polyp   . IBS (irritable bowel syndrome)    constipation predominant  . Internal hemorrhoids   . Osteoporosis    dexa 2011 -2.7 hip nl spine  . PMR (polymyalgia rheumatica) (HCC) 08/30/2011   under rheum care  on low dose pred 5 mg   . Polymyalgia rheumatica ([place])   . Renal disorder     Family History  Problem Relation Age of Onset  . Other Mother        fx hip  . Other Sister        fx hip  . Bladder Cancer Brother   . Colon cancer Neg Hx   . Esophageal cancer Neg Hx   . Stomach cancer Neg Hx   . Rectal cancer Neg Hx   . Liver cancer Neg Hx     Social History   Socioeconomic History  . Marital status: Widowed    Spouse name: None  . Number of children: None  . Years of education: None  . Highest education level: None  Social Needs  . Financial resource strain: None  . Food insecurity - worry: None  . Food insecurity - inability: None  . Transportation needs  - medical: None  . Transportation needs - non-medical: None  Occupational History  . Occupation: retired  Tobacco Use  . Smoking status: Never Smoker  . Smokeless tobacco: Never Used  Substance and Sexual Activity  . Alcohol use: Yes    Alcohol/week: 0.0 oz    Comment: occ  . Drug use: No  . Sexual activity: None  Other Topics Concern  . None  Social History Narrative   Retired   Regular exercise- yes   Widowed   At home with children and GKs    HH of 2     Puppy   Plays cards is social and active     Outpatient Medications Prior to Visit  Medication Sig Dispense Refill  . acetaminophen (TYLENOL) 500 MG tablet Take 500 mg by mouth every 6 (six) hours as needed for mild pain, moderate pain or headache.    Marland Kitchen apixaban (ELIQUIS) 5 MG TABS tablet Take 1 tablet (5 mg total) by mouth 2 (two) times daily. 60 tablet 0  .  Ascorbic Acid (VITAMIN C) 100 MG tablet Take 100 mg by mouth daily.      . calcium carbonate (OS-CAL) 1250 (500 Ca) MG chewable tablet Chew 1 tablet by mouth 2 (two) times daily.    . fish oil-omega-3 fatty acids 1000 MG capsule Take 1 g by mouth daily.     . fluticasone (FLONASE) 50 MCG/ACT nasal spray 2 spray each nostril qd (Patient taking differently: Place 2 sprays into both nostrils daily as needed for allergies. ) 16 g 3  . guaiFENesin (MUCINEX) 600 MG 12 hr tablet Take by mouth 2 (two) times daily.    Marland Kitchen loratadine (CLARITIN) 10 MG tablet Take 10 mg by mouth daily as needed for allergies.    . metoprolol tartrate (LOPRESSOR) 25 MG tablet Take 1 tablet (25 mg total) by mouth 2 (two) times daily. 60 tablet 0  . MULTIPLE VITAMIN PO Take 1 tablet by mouth daily.     . polyethylene glycol (MIRALAX / GLYCOLAX) packet Take 17 g by mouth daily.    Marland Kitchen PREMARIN vaginal cream once a week.  10  . Probiotic Product (ALIGN PO) Take 1 tablet by mouth daily.    . RESTASIS 0.05 % ophthalmic emulsion Place 1 drop into both eyes 2 (two) times daily.  3  . traMADol (ULTRAM) 50 MG  tablet Take 1 tablet (50 mg total) every 6 (six) hours as needed by mouth. 10 tablet 0   No facility-administered medications prior to visit.      EXAM:  BP 108/62 (BP Location: Right Arm, Patient Position: Sitting, Cuff Size: Normal)   Pulse 72   Temp 98 F (36.7 C) (Oral)   Wt 161 lb (73 kg)   BMI 31.44 kg/m   Body mass index is 31.44 kg/m.  GENERAL: vitals reviewed and listed above, alert, oriented, appears well hydrated and in no acute distress HEENT: atraumatic, conjunctiva  clear, no obvious abnormalities on inspection of external nose and ears OP : no lesion edema or exudate  NECK: no obvious masses on inspection palpation  LUNGS: clear to auscultation bilaterally, no wheezes, rales or rhonchi, good air movement abd mild soreness left ( [place])  No masses  CV: HRRR, no clubbing cyanosis or  peripheral edema nl cap refill  MS: moves all extremities without noticeable focal  Abnormality  djd changes  No bruising bleeding  PSYCH: pleasant and cooperative, no obvious depression or anxiety Lab Results  Component Value Date   WBC 6.8 05/02/2017   HGB 12.9 05/02/2017   HCT 35.9 (L) 05/02/2017   PLT 257 05/02/2017   GLUCOSE 77 05/03/2017   CHOL 188 04/03/2017   TRIG 136.0 04/03/2017   HDL 69.60 04/03/2017   LDLDIRECT 125.8 02/23/2013   LDLCALC 91 04/03/2017   ALT 24 05/01/2017   AST 30 05/01/2017   NA 135 05/03/2017   K 4.0 05/03/2017   CL 108 05/03/2017   CREATININE 0.74 05/03/2017   BUN 8 05/03/2017   CO2 20 (L) 05/03/2017   TSH 2.85 04/03/2017   BP Readings from Last 3 Encounters:  05/14/17 108/62  05/03/17 136/77  04/03/17 100/68    ASSESSMENT AND PLAN:  Discussed the following assessment and plan:  Atrial fibrillation, unspecified type ([place]) - curently seems sinus revied plan meds etc and fu  - Plan: Basic metabolic panel, CBC with Differential/Platelet  Lower urinary tract symptoms (LUTS) - hx uti under care - Plan: POC Urinalysis Dipstick  Medication  management - Plan: Basic metabolic panel, CBC with  Differential/Platelet  Chronic anticoagulation - Plan: Basic metabolic panel, CBC with Differential/Platelet  Low calcium levels - indicnetal in hosp 8.8 will recheck in a month  - Plan: Basic metabolic panel, CBC with Differential/Platelet  Osteoporosis, unspecified osteoporosis type, unspecified pathological fracture presence  Hospital discharge follow-up Hx of afib    No need to repeat lab test .  -Patient advised to return or notify health care team  if  new concerns arise.  Patient Instructions  Glad you are doing better .  prolia  Is a pretty safe drug    As of current but  I agree  to  Not begin any new medication at this time .   Heart sounds good today  Stay on  Blood thinner and keep appt with cardiology.   seek immediate care if any   Bleeding or concerns otherwise .s  Continue   Physical therapy.     For bladder  at time .  Will le tyou know about   Urine test results   Get blood tests in a month  ( no ov needed  Lab appt only)         Mariann Laster K. Panosh M.D.  Discharge Diagnoses:  Principal Problem:   Atrial fibrillation with RVR ([place])  1]New onset atrial fibrillation-patient was admitted to telemetry for started on metoprolol tartrate 25 mg twice a day.  She was also started on Eliquis 5 mg twice a day for stroke prophylaxis.  She was seen in consultation by cardiology and she has a follow-up appointment with [place].  TSH was normal. echocardiogram showedLVEF 55-60%, mild LVH, systolic bowing of the mitral valve without prolapse, mild mitral regurgitation, normal LA size, mild RAE, mild TR, RVSP 32 mmHg, normal IVC.  2] chronic hyponatremia has been stable sodium 135 upon discharge after IV hydration.  With normal saline.

## 2017-05-13 DIAGNOSIS — R102 Pelvic and perineal pain: Secondary | ICD-10-CM | POA: Diagnosis not present

## 2017-05-13 DIAGNOSIS — R350 Frequency of micturition: Secondary | ICD-10-CM | POA: Diagnosis not present

## 2017-05-13 DIAGNOSIS — M6289 Other specified disorders of muscle: Secondary | ICD-10-CM | POA: Diagnosis not present

## 2017-05-13 DIAGNOSIS — M62838 Other muscle spasm: Secondary | ICD-10-CM | POA: Diagnosis not present

## 2017-05-13 DIAGNOSIS — M6281 Muscle weakness (generalized): Secondary | ICD-10-CM | POA: Diagnosis not present

## 2017-05-14 ENCOUNTER — Encounter: Payer: Self-pay | Admitting: Internal Medicine

## 2017-05-14 ENCOUNTER — Ambulatory Visit: Payer: Medicare Other | Admitting: Internal Medicine

## 2017-05-14 VITALS — BP 108/62 | HR 72 | Temp 98.0°F | Wt 161.0 lb

## 2017-05-14 DIAGNOSIS — Z09 Encounter for follow-up examination after completed treatment for conditions other than malignant neoplasm: Secondary | ICD-10-CM

## 2017-05-14 DIAGNOSIS — Z7901 Long term (current) use of anticoagulants: Secondary | ICD-10-CM

## 2017-05-14 DIAGNOSIS — M810 Age-related osteoporosis without current pathological fracture: Secondary | ICD-10-CM | POA: Diagnosis not present

## 2017-05-14 DIAGNOSIS — Z79899 Other long term (current) drug therapy: Secondary | ICD-10-CM | POA: Diagnosis not present

## 2017-05-14 DIAGNOSIS — R399 Unspecified symptoms and signs involving the genitourinary system: Secondary | ICD-10-CM | POA: Diagnosis not present

## 2017-05-14 DIAGNOSIS — I4891 Unspecified atrial fibrillation: Secondary | ICD-10-CM

## 2017-05-14 LAB — POCT URINALYSIS DIPSTICK
Bilirubin, UA: NEGATIVE
Glucose, UA: NEGATIVE
Ketones, UA: NEGATIVE
LEUKOCYTES UA: NEGATIVE
NITRITE UA: NEGATIVE
Protein, UA: NEGATIVE
SPEC GRAV UA: 1.015 (ref 1.010–1.025)
Urobilinogen, UA: 0.2 E.U./dL
pH, UA: 6 (ref 5.0–8.0)

## 2017-05-14 NOTE — Patient Instructions (Addendum)
Glad you are doing better .  prolia  Is a pretty safe drug    As of current but  I agree  to  Not begin any new medication at this time .   Heart sounds good today  Stay on  Blood thinner and keep appt with cardiology.   seek immediate care if any   Bleeding or concerns otherwise .s  Continue   Physical therapy.     For bladder  at time .  Will le tyou know about   Urine test results   Get blood tests in a month  ( no ov needed  Lab appt only)

## 2017-05-19 DIAGNOSIS — M62838 Other muscle spasm: Secondary | ICD-10-CM | POA: Diagnosis not present

## 2017-05-19 DIAGNOSIS — M6289 Other specified disorders of muscle: Secondary | ICD-10-CM | POA: Diagnosis not present

## 2017-05-19 DIAGNOSIS — R102 Pelvic and perineal pain: Secondary | ICD-10-CM | POA: Diagnosis not present

## 2017-05-19 DIAGNOSIS — M6281 Muscle weakness (generalized): Secondary | ICD-10-CM | POA: Diagnosis not present

## 2017-05-26 ENCOUNTER — Encounter: Payer: Self-pay | Admitting: Physician Assistant

## 2017-05-26 NOTE — Progress Notes (Signed)
Cardiology Office Note    Date:  05/28/2017  ID:  Ashley Lawson, DOB 11/25/1932, MRN [id number] PCP:  Burnis Medin, MD  Cardiologist: Dr. Angelena Form   Chief Complaint: f/u atrial fib  History of Present Illness:  Ashley Lawson is a 84 y.o. female with history of allergic rhinitis, compression fx, DDD, diverticulosis, GERD, hepatic steatosis, IBS, HLD, internal hemorrhoids, PMR, recently diagnosed PAF, mild MR who presents for post-hospital follow-up. Earlier this month she was recovering from a recent sinus infection, UTI, and bladder procedure and was admitted with weakness. She was found to be in new onset atrial fib. She felt this as a sensation of chest pressure/wobbliness. She was started on beta blocker and Eliquis and converted spontaneously to NSR. She was treated with normal saline for hyponatremia with improvement in sodium from 128->135 (appears to be chronic issue). 2D echo 05/02/17 showed EF 15-72%, mild MR, systolic bowing without prolapse, mildly dilated RA. Labs otherwise were pertinent for K 4.0, Na 135, Cr 0.78, Hgb 12.9, neg troponin; recent TSH 04/2017 was normal with LDL 91. CHADSVASC 3 for age, HTN.  She presents for follow-up. Overall she feels like she's done well, but has noticed mild fatigue since getting out of the hospital. No chest pain or dyspnea, just feeling more tired overall. Her "wobbliness" has resolved. She also has noticed on two occasions her pinky or other finger on her left hand turning white at the tip for a few minutes before resolving spontaneously. She has never had a stroke or TIA that she is aware of. She has history of episodic spot of blood on tissue when blowing nose ever since prior intranasal surgery.   Past Medical History:  Diagnosis Date  . Abdominal pain 12/2009   hospitalized  . Allergic rhinitis   . Chronic constipation   . Compression fx, lumbar spine ([place])   . DDD (degenerative disc disease), lumbar   . Diverticulosis of colon   .  Dog bite(E906.0) 08/30/2011  . GERD (gastroesophageal reflux disease)   . Hepatic steatosis   . Hyperlipidemia   . Hyperplastic colon polyp   . IBS (irritable bowel syndrome)    constipation predominant  . Internal hemorrhoids   . Osteoporosis    dexa 2011 -2.7 hip nl spine  . PAF (paroxysmal atrial fibrillation) ([place])    a. dx 05/2017.  Marland Kitchen PMR (polymyalgia rheumatica) (HCC) 08/30/2011   under rheum care  on low dose pred 5 mg   . Polymyalgia rheumatica ([place])   . Renal disorder     Past Surgical History:  Procedure Laterality Date  . ABDOMINAL HYSTERECTOMY    . CATARACT EXTRACTION  2010   both  . COLONOSCOPY  2016 was most recent    Current Medications: Current Meds  Medication Sig  . acetaminophen (TYLENOL) 500 MG tablet Take 500 mg by mouth every 6 (six) hours as needed for mild pain, moderate pain or headache.  Marland Kitchen apixaban (ELIQUIS) 5 MG TABS tablet Take 1 tablet (5 mg total) by mouth 2 (two) times daily.  . Ascorbic Acid (VITAMIN C) 100 MG tablet Take 100 mg by mouth daily.    . calcium carbonate (OS-CAL) 1250 (500 Ca) MG chewable tablet Chew 1 tablet by mouth 2 (two) times daily.  Marland Kitchen conjugated estrogens (PREMARIN) vaginal cream Place 1 Applicatorful vaginally daily as needed (dryness).  . fish oil-omega-3 fatty acids 1000 MG capsule Take 1 g by mouth daily.   . fluticasone (FLONASE) 50 MCG/ACT nasal spray  Place 2 sprays into both nostrils daily.  Marland Kitchen guaiFENesin (MUCINEX) 600 MG 12 hr tablet Take 600 mg by mouth 2 (two) times daily as needed (allergies).  . loratadine (CLARITIN) 10 MG tablet Take 10 mg by mouth daily as needed for allergies.  . metoprolol tartrate (LOPRESSOR) 25 MG tablet Take 1 tablet (25 mg total) by mouth 2 (two) times daily.  . MULTIPLE VITAMIN PO Take 1 tablet by mouth daily.   . polyethylene glycol (MIRALAX / GLYCOLAX) packet Take 17 g by mouth daily.  . Probiotic Product (ALIGN PO) Take 1 tablet by mouth daily.  . RESTASIS 0.05 % ophthalmic emulsion  Place 1 drop into both eyes 2 (two) times daily.    Allergies:   Alendronate sodium; Penicillins; Amoxicillin; and Metronidazole   Social History   Socioeconomic History  . Marital status: Widowed    Spouse name: None  . Number of children: None  . Years of education: None  . Highest education level: None  Social Needs  . Financial resource strain: None  . Food insecurity - worry: None  . Food insecurity - inability: None  . Transportation needs - medical: None  . Transportation needs - non-medical: None  Occupational History  . Occupation: retired  Tobacco Use  . Smoking status: Never Smoker  . Smokeless tobacco: Never Used  Substance and Sexual Activity  . Alcohol use: Yes    Alcohol/week: 0.0 oz    Comment: occ  . Drug use: No  . Sexual activity: None  Other Topics Concern  . None  Social History Narrative   Retired   Regular exercise- yes   Widowed   At home with children and GKs    HH of 2     Puppy   Plays cards is social and active      Family History:  Family History  Problem Relation Age of Onset  . Other Mother        fx hip  . Other Sister        fx hip  . Bladder Cancer Brother   . Colon cancer Neg Hx   . Esophageal cancer Neg Hx   . Stomach cancer Neg Hx   . Rectal cancer Neg Hx   . Liver cancer Neg Hx     ROS:   Please see the history of present illness. Otherwise, review of systems is positive for occasional 1 dark stool few times a year. All other systems are reviewed and otherwise negative.    PHYSICAL EXAM:   VS:  BP 130/70   Pulse (!) 57   Ht 5' (1.524 m)   Wt 158 lb 6.4 oz (71.8 kg)   BMI 30.94 kg/m   BMI: Body mass index is 30.94 kg/m. GEN: Well nourished, well developed WF in no acute distress  HEENT: normocephalic, atraumatic Neck: no JVD, carotid bruits, or masses Cardiac: RRR; no murmurs, rubs, or gallops, no edema, no evidence of vascular compromise, good cap refill in extremities Respiratory:  clear to auscultation  bilaterally, normal work of breathing GI: soft, nontender, nondistended, + BS MS: no deformity or atrophy  Skin: warm and dry, no rash Neuro:  Alert and Oriented x 3, Strength and sensation are intact, follows commands Psych: euthymic mood, full affect  Wt Readings from Last 3 Encounters:  05/28/17 158 lb 6.4 oz (71.8 kg)  05/14/17 161 lb (73 kg)  05/02/17 163 lb 5.8 oz (74.1 kg)      Studies/Labs Reviewed:   EKG:  EKG was ordered today and personally reviewed by me and demonstrates sinus bradycardia 57bpm, TWI III, otherwise no acute changes. Recent Labs: 04/03/2017: TSH 2.85 05/01/2017: ALT 24; Magnesium 2.1 05/02/2017: Hemoglobin 12.9; Platelets 257 05/03/2017: BUN 8; Creatinine, Ser 0.74; Potassium 4.0; Sodium 135   Lipid Panel    Component Value Date/Time   CHOL 188 04/03/2017 0908   TRIG 136.0 04/03/2017 0908   HDL 69.60 04/03/2017 0908   CHOLHDL 3 04/03/2017 0908   VLDL 27.2 04/03/2017 0908   LDLCALC 91 04/03/2017 0908   LDLDIRECT 125.8 02/23/2013 1000    Additional studies/ records that were reviewed today include: Summarized above.    ASSESSMENT & PLAN:   1. Paroxysmal atrial fib - maintaining NSR/SB. She reports some fatigue since the hospital. I suspect this might be related to metoprolol +/- sinus bradycardia. Will stop metoprolol and change to low dose diltiazem (also for reasons of possible Raynaud's phenomena). Check f/u BMET, CBC, TSH for fatigue. Long discussion about nature of afib, stroke/bleeding risk and bleeding precautions. She does note episodic spot of blood on tissue related to prior nasal surgery. She has not had any substantial bleeding before. She plans to get back into ENT to discuss. She also reports rare history of black stool every few months but no prior actual diagnosed GIB - she believes this was related to foods she ate and does take intermittent Pepto Bismol. She has not had this occur for several months. If this recurs, would need GI workup.  I've asked her to discuss with PCP. Keep f/u labs in March with primary care as planned. [facility] the [facility]. She thinks she will feel better if she gets moving again. 2. Fatigue - as above. Check surveillance labs today. D/c beta blocker and follow on diltiazem. Daughter states she does snore a little when she initiates sleep but then the snoring resolves. Sleep study can be considered if fatigue persists. 3. Raynaud phenomena - she describes what sounds like transient vasoconstriction. She has normal cap refil and normal pulses in her left extremity and no other focal deficits. Will stop metoprolol and follow on diltiazem. 4. Mild mitral regurgitation - anticipate monitoring this clinically for now. 5. Hyperlipidemia - monitored by PCP.  Disposition: F/u with Dr. Angelena Form in 3 months.   Medication Adjustments/Labs and Tests Ordered: Current medicines are reviewed at length with the patient today.  Concerns regarding medicines are outlined above. Medication changes, Labs and Tests ordered today are summarized above and listed in the Patient Instructions accessible in Encounters.   Signed, Charlie Pitter, PA-C  05/28/2017 9:54 AM    [facility] [address], [place], [place]  [address] Phone: [phone]; Fax: [phone]

## 2017-05-28 ENCOUNTER — Encounter: Payer: Self-pay | Admitting: Physician Assistant

## 2017-05-28 ENCOUNTER — Ambulatory Visit: Payer: Medicare Other | Admitting: Physician Assistant

## 2017-05-28 VITALS — BP 130/70 | HR 57 | Ht 60.0 in | Wt 158.4 lb

## 2017-05-28 DIAGNOSIS — I7300 Raynaud's syndrome without gangrene: Secondary | ICD-10-CM | POA: Diagnosis not present

## 2017-05-28 DIAGNOSIS — Z79899 Other long term (current) drug therapy: Secondary | ICD-10-CM | POA: Diagnosis not present

## 2017-05-28 DIAGNOSIS — R5383 Other fatigue: Secondary | ICD-10-CM

## 2017-05-28 DIAGNOSIS — E785 Hyperlipidemia, unspecified: Secondary | ICD-10-CM

## 2017-05-28 DIAGNOSIS — I340 Nonrheumatic mitral (valve) insufficiency: Secondary | ICD-10-CM | POA: Diagnosis not present

## 2017-05-28 DIAGNOSIS — I480 Paroxysmal atrial fibrillation: Secondary | ICD-10-CM

## 2017-05-28 LAB — CBC WITH DIFFERENTIAL/PLATELET
Basophils Absolute: 0 10*3/uL (ref 0.0–0.2)
Basos: 1 %
EOS (ABSOLUTE): 0.1 10*3/uL (ref 0.0–0.4)
Eos: 1 %
HEMATOCRIT: 38.5 % (ref 34.0–46.6)
Hemoglobin: 13.2 g/dL (ref 11.1–15.9)
IMMATURE GRANS (ABS): 0 10*3/uL (ref 0.0–0.1)
IMMATURE GRANULOCYTES: 0 %
Lymphocytes Absolute: 1.8 10*3/uL (ref 0.7–3.1)
Lymphs: 27 %
MCH: 32.1 pg (ref 26.6–33.0)
MCHC: 34.3 g/dL (ref 31.5–35.7)
MCV: 94 fL (ref 79–97)
MONOCYTES: 9 %
Monocytes Absolute: 0.6 10*3/uL (ref 0.1–0.9)
NEUTROS ABS: 4.2 10*3/uL (ref 1.4–7.0)
Neutrophils: 62 %
PLATELETS: 240 10*3/uL (ref 150–379)
RBC: 4.11 x10E6/uL (ref 3.77–5.28)
RDW: 13.3 % (ref 12.3–15.4)
WBC: 6.7 10*3/uL (ref 3.4–10.8)

## 2017-05-28 LAB — BASIC METABOLIC PANEL
BUN / CREAT RATIO: 10 — AB (ref 12–28)
BUN: 8 mg/dL (ref 8–27)
CHLORIDE: 97 mmol/L (ref 96–106)
CO2: 21 mmol/L (ref 20–29)
Calcium: 9.3 mg/dL (ref 8.7–10.3)
Creatinine, Ser: 0.81 mg/dL (ref 0.57–1.00)
GFR calc non Af Amer: 68 mL/min/{1.73_m2} (ref >59)
GFR, EST AFRICAN AMERICAN: 78 mL/min/{1.73_m2} (ref >59)
Glucose: 88 mg/dL (ref 65–99)
POTASSIUM: 4.4 mmol/L (ref 3.5–5.2)
Sodium: 135 mmol/L (ref 134–144)

## 2017-05-28 LAB — TSH: TSH: 3.67 u[IU]/mL (ref 0.450–4.500)

## 2017-05-28 MED ORDER — DILTIAZEM HCL ER COATED BEADS 120 MG PO CP24: 120.0000 mg | ORAL_CAPSULE | Freq: Every day | ORAL | 3 refills | Status: DC

## 2017-05-28 MED ORDER — APIXABAN 5 MG PO TABS: 5.0000 mg | ORAL_TABLET | Freq: Two times a day (BID) | ORAL | 0 refills | Status: DC

## 2017-05-28 NOTE — Patient Instructions (Addendum)
Medication Instructions:  Your physician has recommended you make the following change in your medication:  1. STOP Metoprolol 2. START Diltiazem CD 120 mg once daily  * If you need a refill on your cardiac medications before your next appointment, please call your pharmacy. *  Labwork: Today: BMET, CBC w/ diff & TSH  Testing/Procedures: None ordered  Follow-Up: Your physician recommends that you schedule a follow-up appointment in: 3 months with Dr. Angelena Form  Thank you for choosing [facility]!!    Any Other Special Instructions Will Be Listed Below (If Applicable).

## 2017-05-29 DIAGNOSIS — R102 Pelvic and perineal pain: Secondary | ICD-10-CM | POA: Diagnosis not present

## 2017-05-29 DIAGNOSIS — M6289 Other specified disorders of muscle: Secondary | ICD-10-CM | POA: Diagnosis not present

## 2017-05-29 DIAGNOSIS — M6281 Muscle weakness (generalized): Secondary | ICD-10-CM | POA: Diagnosis not present

## 2017-05-29 DIAGNOSIS — M62838 Other muscle spasm: Secondary | ICD-10-CM | POA: Diagnosis not present

## 2017-06-03 DIAGNOSIS — M62838 Other muscle spasm: Secondary | ICD-10-CM | POA: Diagnosis not present

## 2017-06-03 DIAGNOSIS — R3982 Chronic bladder pain: Secondary | ICD-10-CM | POA: Diagnosis not present

## 2017-06-03 DIAGNOSIS — M6289 Other specified disorders of muscle: Secondary | ICD-10-CM | POA: Diagnosis not present

## 2017-06-03 DIAGNOSIS — R350 Frequency of micturition: Secondary | ICD-10-CM | POA: Diagnosis not present

## 2017-06-03 DIAGNOSIS — M6281 Muscle weakness (generalized): Secondary | ICD-10-CM | POA: Diagnosis not present

## 2017-06-11 ENCOUNTER — Other Ambulatory Visit: Payer: Medicare Other

## 2017-06-11 ENCOUNTER — Ambulatory Visit: Payer: Self-pay | Admitting: *Deleted

## 2017-06-11 ENCOUNTER — Telehealth: Payer: Self-pay | Admitting: Internal Medicine

## 2017-06-11 NOTE — Telephone Encounter (Signed)
Patient is having some minor rectal bleeding.  She sees blood on the tissue when she wipes since starting Eliquis.  She is needing to strain to have a BM.  She is advised to increase her Miralax to BID and will try adding Citrucel and increasing her fluid.  She is asking for a refill of Diltiazem or an alternate.  Please advise

## 2017-06-11 NOTE — Telephone Encounter (Signed)
Agree and would refill diltiazem and lidocaine cream from [facility] x 3 Schedule REV next avail also

## 2017-06-11 NOTE — Telephone Encounter (Signed)
Patient states since she was put on a blood thinner she has had a lot of rectal bleeding. She also states she is having problems with her hemorrhoids and wants to know if Dr.Gessner can give her the oily stuff that goes into the rectum. Patient scheduled for f/u on 5.16.19.

## 2017-06-11 NOTE — Telephone Encounter (Signed)
Pt calling with complaints of constipation and hemorrhoids. Pt stated she called earlier and was given instructions on what to do about medications. Notified pt that it was noted in her chart that she did speak with a nurse at her gastroenterologist office and was advised to increase Miralax to twice a day  And to add Citrucel. Pt verbalized understanding and advised to call office if she has no relief of current symptoms.

## 2017-06-12 MED ORDER — AMBULATORY NON FORMULARY MEDICATION: 3 refills | Status: DC

## 2017-06-12 NOTE — Telephone Encounter (Signed)
Patient notified New rx faxed to [facility].  She will keep her appt in May with Dr. Carlean Purl

## 2017-06-17 DIAGNOSIS — M62838 Other muscle spasm: Secondary | ICD-10-CM | POA: Diagnosis not present

## 2017-06-17 DIAGNOSIS — M6289 Other specified disorders of muscle: Secondary | ICD-10-CM | POA: Diagnosis not present

## 2017-06-17 DIAGNOSIS — M6281 Muscle weakness (generalized): Secondary | ICD-10-CM | POA: Diagnosis not present

## 2017-06-17 DIAGNOSIS — R3982 Chronic bladder pain: Secondary | ICD-10-CM | POA: Diagnosis not present

## 2017-06-17 DIAGNOSIS — R102 Pelvic and perineal pain: Secondary | ICD-10-CM | POA: Diagnosis not present

## 2017-06-18 ENCOUNTER — Other Ambulatory Visit (INDEPENDENT_AMBULATORY_CARE_PROVIDER_SITE_OTHER): Payer: Medicare Other

## 2017-06-18 DIAGNOSIS — Z7901 Long term (current) use of anticoagulants: Secondary | ICD-10-CM | POA: Diagnosis not present

## 2017-06-18 DIAGNOSIS — I4891 Unspecified atrial fibrillation: Secondary | ICD-10-CM

## 2017-06-18 DIAGNOSIS — Z79899 Other long term (current) drug therapy: Secondary | ICD-10-CM

## 2017-06-18 LAB — CBC WITH DIFFERENTIAL/PLATELET
BASOS ABS: 0.1 10*3/uL (ref 0.0–0.1)
Basophils Relative: 1 % (ref 0.0–3.0)
EOS PCT: 1.2 % (ref 0.0–5.0)
Eosinophils Absolute: 0.1 10*3/uL (ref 0.0–0.7)
HEMATOCRIT: 38.4 % (ref 36.0–46.0)
Hemoglobin: 13.1 g/dL (ref 12.0–15.0)
LYMPHS ABS: 2.1 10*3/uL (ref 0.7–4.0)
LYMPHS PCT: 30.5 % (ref 12.0–46.0)
MCHC: 34.1 g/dL (ref 30.0–36.0)
MCV: 95.1 fl (ref 78.0–100.0)
MONOS PCT: 8.3 % (ref 3.0–12.0)
Monocytes Absolute: 0.6 10*3/uL (ref 0.1–1.0)
NEUTROS ABS: 4 10*3/uL (ref 1.4–7.7)
Neutrophils Relative %: 59 % (ref 43.0–77.0)
PLATELETS: 256 10*3/uL (ref 150.0–400.0)
RBC: 4.04 Mil/uL (ref 3.87–5.11)
RDW: 13.5 % (ref 11.5–15.5)
WBC: 6.7 10*3/uL (ref 4.0–10.5)

## 2017-06-19 LAB — BASIC METABOLIC PANEL
BUN: 11 mg/dL (ref 6–23)
CALCIUM: 9.5 mg/dL (ref 8.4–10.5)
CHLORIDE: 99 meq/L (ref 96–112)
CO2: 23 meq/L (ref 19–32)
Creatinine, Ser: 0.94 mg/dL (ref 0.40–1.20)
GFR: 60.45 mL/min (ref >60.00)
Glucose, Bld: 109 mg/dL — ABNORMAL HIGH (ref 70–99)
Potassium: 3.9 mEq/L (ref 3.5–5.1)
Sodium: 133 mEq/L — ABNORMAL LOW (ref 135–145)

## 2017-07-10 DIAGNOSIS — M6281 Muscle weakness (generalized): Secondary | ICD-10-CM | POA: Diagnosis not present

## 2017-07-10 DIAGNOSIS — R102 Pelvic and perineal pain: Secondary | ICD-10-CM | POA: Diagnosis not present

## 2017-07-10 DIAGNOSIS — M6289 Other specified disorders of muscle: Secondary | ICD-10-CM | POA: Diagnosis not present

## 2017-07-10 DIAGNOSIS — M62838 Other muscle spasm: Secondary | ICD-10-CM | POA: Diagnosis not present

## 2017-07-22 DIAGNOSIS — R102 Pelvic and perineal pain: Secondary | ICD-10-CM | POA: Diagnosis not present

## 2017-07-22 DIAGNOSIS — M6281 Muscle weakness (generalized): Secondary | ICD-10-CM | POA: Diagnosis not present

## 2017-07-22 DIAGNOSIS — M62838 Other muscle spasm: Secondary | ICD-10-CM | POA: Diagnosis not present

## 2017-07-22 DIAGNOSIS — R3982 Chronic bladder pain: Secondary | ICD-10-CM | POA: Diagnosis not present

## 2017-07-22 DIAGNOSIS — R350 Frequency of micturition: Secondary | ICD-10-CM | POA: Diagnosis not present

## 2017-08-12 DIAGNOSIS — M6289 Other specified disorders of muscle: Secondary | ICD-10-CM | POA: Diagnosis not present

## 2017-08-12 DIAGNOSIS — M6281 Muscle weakness (generalized): Secondary | ICD-10-CM | POA: Diagnosis not present

## 2017-08-12 DIAGNOSIS — R102 Pelvic and perineal pain: Secondary | ICD-10-CM | POA: Diagnosis not present

## 2017-08-12 DIAGNOSIS — M62838 Other muscle spasm: Secondary | ICD-10-CM | POA: Diagnosis not present

## 2017-08-14 ENCOUNTER — Ambulatory Visit: Payer: Medicare Other | Admitting: Internal Medicine

## 2017-08-23 ENCOUNTER — Other Ambulatory Visit: Payer: Self-pay | Admitting: Physician Assistant

## 2017-08-26 NOTE — Telephone Encounter (Signed)
Eliquis 52m refill request received; pt is 84yrs old, wt-71.8kg, Crea-0.94 on 06/18/17, last seen by DMelina Copaon 05/28/17; will send in refill to requested pharmacy.

## 2017-08-27 ENCOUNTER — Encounter: Payer: Self-pay | Admitting: Cardiovascular Disease

## 2017-08-27 ENCOUNTER — Ambulatory Visit: Payer: Medicare Other | Admitting: Cardiovascular Disease

## 2017-08-27 VITALS — BP 138/80 | HR 82 | Ht 60.0 in | Wt 162.1 lb

## 2017-08-27 DIAGNOSIS — I480 Paroxysmal atrial fibrillation: Secondary | ICD-10-CM

## 2017-08-27 DIAGNOSIS — I340 Nonrheumatic mitral (valve) insufficiency: Secondary | ICD-10-CM

## 2017-08-27 NOTE — Progress Notes (Signed)
Chief Complaint  Patient presents with  . Follow-up    Atrial fibrillation   History of Present Illness: 84 yo female with history of GERD, diverticulosis, IBS, HLD, PMR and paroxysmal atrial fibrillation who is here today for follow up. She was admitted to [facility] in January 2019 with a UTI and sinus infection and was found to be in atrial fibrillation. She was started on a beta blocker and Eliquis and converted to sinus. She was treated for hyponatremia. Echo 05/02/17 with LVEF=55-60%, mild MR. TSH was normal. She was seen in our office for follow up 05/2717 by Melina Copa, PA-C and was doing well but having some fatigue. Her beta blocker was stopped and Cardizem was started.   She is here today for follow up. The patient denies any chest pain, dyspnea, palpitations, lower extremity edema, orthopnea, PND, dizziness, near syncope or syncope.   Primary Care Physician: Burnis Medin, MD  Past Medical History:  Diagnosis Date  . Abdominal pain 12/2009   hospitalized  . Allergic rhinitis   . Chronic constipation   . Compression fx, lumbar spine ([place])   . DDD (degenerative disc disease), lumbar   . Diverticulosis of colon   . Dog bite(E906.0) 08/30/2011  . GERD (gastroesophageal reflux disease)   . Hepatic steatosis   . Hyperlipidemia   . Hyperplastic colon polyp   . IBS (irritable bowel syndrome)    constipation predominant  . Internal hemorrhoids   . Osteoporosis    dexa 2011 -2.7 hip nl spine  . PAF (paroxysmal atrial fibrillation) ([place])    a. dx 05/2017.  Marland Kitchen PMR (polymyalgia rheumatica) (HCC) 08/30/2011   under rheum care  on low dose pred 5 mg   . Polymyalgia rheumatica ([place])   . Renal disorder     Past Surgical History:  Procedure Laterality Date  . ABDOMINAL HYSTERECTOMY    . CATARACT EXTRACTION  2010   both  . COLONOSCOPY  2016 was most recent    Current Outpatient Medications  Medication Sig Dispense Refill  . acetaminophen (TYLENOL) 500 MG tablet Take 500 mg by mouth  every 6 (six) hours as needed for mild pain, moderate pain or headache.    . AMBULATORY NON FORMULARY MEDICATION Medication Name: Diltiazem 2% with lidocaine 5% Insert a pea size amount into rectum to the first knuckle TID 30 g 3  . Ascorbic Acid (VITAMIN C) 100 MG tablet Take 100 mg by mouth daily.      . calcium carbonate (OS-CAL) 1250 (500 Ca) MG chewable tablet Chew 1 tablet by mouth 2 (two) times daily.    Marland Kitchen conjugated estrogens (PREMARIN) vaginal cream Place 1 Applicatorful vaginally daily as needed (dryness).    Marland Kitchen diltiazem (CARDIZEM CD) 120 MG 24 hr capsule Take 1 capsule (120 mg total) by mouth daily. 30 capsule 3  . ELIQUIS 5 MG TABS tablet TAKE 1 TABLET BY MOUTH TWICE A DAY 180 tablet 2  . fish oil-omega-3 fatty acids 1000 MG capsule Take 1 g by mouth daily.     . fluticasone (FLONASE) 50 MCG/ACT nasal spray Place 2 sprays into both nostrils daily.    Marland Kitchen guaiFENesin (MUCINEX) 600 MG 12 hr tablet Take 600 mg by mouth 2 (two) times daily as needed (allergies).    . loratadine (CLARITIN) 10 MG tablet Take 10 mg by mouth daily as needed for allergies.    Marland Kitchen MULTIPLE VITAMIN PO Take 1 tablet by mouth daily.     . polyethylene glycol (MIRALAX /  GLYCOLAX) packet Take 17 g by mouth daily.    . Probiotic Product (ALIGN PO) Take 1 tablet by mouth daily.    . RESTASIS 0.05 % ophthalmic emulsion Place 1 drop into both eyes 2 (two) times daily.  3   No current facility-administered medications for this visit.     Allergies  Allergen Reactions  . Alendronate Sodium     upset stomach  . Penicillins     Has patient had a PCN reaction causing immediate rash, facial/tongue/throat swelling, SOB or lightheadedness with hypotension: Yes Has patient had a PCN reaction causing severe rash involving mucus membranes or skin necrosis: No Has patient had a PCN reaction that required hospitalization: No Has patient had a PCN reaction occurring within the last 10 years: No If all of the above answers are  "NO", then may proceed with Cephalosporin use.   Marland Kitchen Amoxicillin Rash    Has patient had a PCN reaction causing immediate rash, facial/tongue/throat swelling, SOB or lightheadedness with hypotension: yes Has patient had a PCN reaction causing severe rash involving mucus membranes or skin necrosis: no Has patient had a PCN reaction that required hospitalization: no Has patient had PCN reaction within the last 10 years: no  If all of the above answers are "NO", then may proceed with Cephalosporin use.  REACTION: unspecified  . Metronidazole Other (See Comments)    Pounding headaches patient says was bad.  With cipro for diverticulitis rx. Other reaction(s): Other (See Comments) Pounding headaches patient says was bad.With cipro for diverticulitis rx.    Social History   Socioeconomic History  . Marital status: Widowed    Spouse name: Not on file  . Number of children: Not on file  . Years of education: Not on file  . Highest education level: Not on file  Occupational History  . Occupation: retired  Scientific laboratory technician  . Financial resource strain: Not on file  . Food insecurity:    Worry: Not on file    Inability: Not on file  . Transportation needs:    Medical: Not on file    Non-medical: Not on file  Tobacco Use  . Smoking status: Never Smoker  . Smokeless tobacco: Never Used  Substance and Sexual Activity  . Alcohol use: Yes    Alcohol/week: 0.0 oz    Comment: occ  . Drug use: No  . Sexual activity: Not on file  Lifestyle  . Physical activity:    Days per week: Not on file    Minutes per session: Not on file  . Stress: Not on file  Relationships  . Social connections:    Talks on phone: Not on file    Gets together: Not on file    Attends religious service: Not on file    Active member of club or organization: Not on file    Attends meetings of clubs or organizations: Not on file    Relationship status: Not on file  . Intimate partner violence:    Fear of current or  ex partner: Not on file    Emotionally abused: Not on file    Physically abused: Not on file    Forced sexual activity: Not on file  Other Topics Concern  . Not on file  Social History Narrative   Retired   Regular exercise- yes   Widowed   At home with children and GKs    HH of 2     Puppy   Plays cards is social and active  Family History  Problem Relation Age of Onset  . Other Mother        fx hip  . Other Sister        fx hip  . Bladder Cancer Brother   . Colon cancer Neg Hx   . Esophageal cancer Neg Hx   . Stomach cancer Neg Hx   . Rectal cancer Neg Hx   . Liver cancer Neg Hx     Review of Systems:  As stated in the HPI and otherwise negative.   BP 138/80   Pulse 82   Ht 5' (1.524 m)   Wt 162 lb 1.9 oz (73.5 kg)   SpO2 98%   BMI 31.66 kg/m   Physical Examination:  General: Well developed, well nourished, NAD  HEENT: OP clear, mucus membranes moist  SKIN: warm, dry. No rashes. Neuro: No focal deficits  Musculoskeletal: Muscle strength 5/5 all ext  Psychiatric: Mood and affect normal  Neck: No JVD, no carotid bruits, no thyromegaly, no lymphadenopathy.  Lungs:Clear bilaterally, no wheezes, rhonci, crackles Cardiovascular: Regular rate and rhythm. No murmurs, gallops or rubs. Abdomen:Soft. Bowel sounds present. Non-tender.  Extremities: No lower extremity edema. Pulses are 2 + in the bilateral DP/PT.  EKG:  EKG is not ordered today. The ekg ordered today demonstrates   Recent Labs: 05/01/2017: ALT 24; Magnesium 2.1 05/28/2017: TSH 3.670 06/18/2017: BUN 11; Creatinine, Ser 0.94; Hemoglobin 13.1; Platelets 256.0; Potassium 3.9; Sodium 133   Lipid Panel    Component Value Date/Time   CHOL 188 04/03/2017 0908   TRIG 136.0 04/03/2017 0908   HDL 69.60 04/03/2017 0908   CHOLHDL 3 04/03/2017 0908   VLDL 27.2 04/03/2017 0908   LDLCALC 91 04/03/2017 0908   LDLDIRECT 125.8 02/23/2013 1000     Wt Readings from Last 3 Encounters:  08/27/17 162 lb 1.9 oz  (73.5 kg)  05/28/17 158 lb 6.4 oz (71.8 kg)  05/14/17 161 lb (73 kg)     Other studies Reviewed: Additional studies/ records that were reviewed today include: . Review of the above records demonstrates:    Assessment and Plan:   1. Paroxysmal atrial fibrillation: She is in sinus today. Will continue Cardizem and Eliquis.   2. Mitral regurgitation: Mild by echo 2019. Will follow   Current medicines are reviewed at length with the patient today.  The patient does not have concerns regarding medicines.  The following changes have been made:  no change  Labs/ tests ordered today include:  No orders of the defined types were placed in this encounter.    Disposition:   FU with me in 12 months   Signed, Lauree Chandler, MD 08/27/2017 4:17 PM    [facility] [address], [place], [place]  [address] Phone: [phone]; Fax: [phone]

## 2017-08-27 NOTE — Patient Instructions (Signed)
Medication Instructions:  Your physician recommends that you continue on your current medications as directed. Please refer to the Current Medication list given to you today.   Labwork: none  Testing/Procedures: none  Follow-Up: Your physician recommends that you schedule a follow-up appointment in: 12 months. Please call our office in about 8 months to schedule this appointment    Any Other Special Instructions Will Be Listed Below (If Applicable).     If you need a refill on your cardiac medications before your next appointment, please call your pharmacy.

## 2017-09-02 ENCOUNTER — Other Ambulatory Visit: Payer: Self-pay | Admitting: Internal Medicine

## 2017-09-02 DIAGNOSIS — M6289 Other specified disorders of muscle: Secondary | ICD-10-CM | POA: Diagnosis not present

## 2017-09-02 DIAGNOSIS — R350 Frequency of micturition: Secondary | ICD-10-CM | POA: Diagnosis not present

## 2017-09-02 DIAGNOSIS — R3982 Chronic bladder pain: Secondary | ICD-10-CM | POA: Diagnosis not present

## 2017-09-02 DIAGNOSIS — M62838 Other muscle spasm: Secondary | ICD-10-CM | POA: Diagnosis not present

## 2017-09-02 DIAGNOSIS — M6281 Muscle weakness (generalized): Secondary | ICD-10-CM | POA: Diagnosis not present

## 2017-09-02 DIAGNOSIS — Z1231 Encounter for screening mammogram for malignant neoplasm of breast: Secondary | ICD-10-CM

## 2017-09-08 ENCOUNTER — Telehealth: Payer: Self-pay | Admitting: Cardiovascular Disease

## 2017-09-08 DIAGNOSIS — H52203 Unspecified astigmatism, bilateral: Secondary | ICD-10-CM | POA: Diagnosis not present

## 2017-09-08 DIAGNOSIS — Z961 Presence of intraocular lens: Secondary | ICD-10-CM | POA: Diagnosis not present

## 2017-09-08 DIAGNOSIS — H353131 Nonexudative age-related macular degeneration, bilateral, early dry stage: Secondary | ICD-10-CM | POA: Diagnosis not present

## 2017-09-08 NOTE — Telephone Encounter (Signed)
Patient had question regarding her Eliquis medication.  She was not sure if she took medication this AM, so I advised her to take it now and then another tonight.  She verbalized understanding.

## 2017-09-08 NOTE — Telephone Encounter (Signed)
New Message  Pt c/o medication issue:  1. Name of Medication: ELIQUIS 5 MG TABS tablet  2. How are you currently taking this medication (dosage and times per day)? TAKE 1 TABLET BY MOUTH TWICE A DAY  3. Are you having a reaction (difficulty breathing--STAT)? no  4. What is your medication issue?  Pt says she does not know if she took her medication this morning and wants to know should she wait until tonight to take. Please call

## 2017-09-16 ENCOUNTER — Other Ambulatory Visit: Payer: Self-pay | Admitting: Physician Assistant

## 2017-09-25 ENCOUNTER — Ambulatory Visit
Admission: RE | Admit: 2017-09-25 | Discharge: 2017-09-25 | Disposition: A | Discharge status: Home / Self Care | Payer: Medicare Other | Source: Ambulatory Visit | Attending: Internal Medicine | Admitting: Internal Medicine

## 2017-09-25 DIAGNOSIS — Z1231 Encounter for screening mammogram for malignant neoplasm of breast: Secondary | ICD-10-CM | POA: Diagnosis not present

## 2017-10-01 ENCOUNTER — Ambulatory Visit: Payer: Medicare Other | Admitting: Internal Medicine

## 2017-10-01 ENCOUNTER — Encounter: Payer: Self-pay | Admitting: Internal Medicine

## 2017-10-01 VITALS — BP 112/70 | HR 72 | Ht 59.0 in | Wt 164.0 lb

## 2017-10-01 DIAGNOSIS — N8189 Other female genital prolapse: Secondary | ICD-10-CM

## 2017-10-01 DIAGNOSIS — K594 Anal spasm: Secondary | ICD-10-CM | POA: Diagnosis not present

## 2017-10-01 DIAGNOSIS — K581 Irritable bowel syndrome with constipation: Secondary | ICD-10-CM

## 2017-10-01 NOTE — Patient Instructions (Signed)
   I have stopped fish oil and Align - off your list.  Keep doing what you are doing otherwise and call back and see me as needed.  I appreciate the opportunity to care for you. Gatha Mayer, MD, [facility]

## 2017-10-01 NOTE — Progress Notes (Signed)
Trust Crago Converse 84 y.o. 1933-03-26 [id number]  Assessment & Plan:   Encounter Diagnoses  Name Primary?  . Irritable bowel syndrome with constipation Yes  . Pelvic floor weakness in female   . Anal sphincter spasm     I think she is doing well overall.  She has some anal irritation causing the bleeding I think worse with Eliquis.  She should continue with her current care and discontinue fish oil as it is unproven to help and I do not think she needs regular align.  I will see her back as needed.  She will continue her diltiazem gel 2% applied to the anal sphincter.  I appreciate the opportunity to care for this patient. CC: Panosh, Standley Brooking, MD     Subjective:   Chief Complaint: Rectal bleeding  HPI The patient is here for a follow-up, she was diagnosed with paroxysmal atrial fibrillation and placed on Eliquis in January of this year and then started noticed some minor rectal bleeding with wiping.  This continues off and on.  She had pelvic floor physical therapy which has helped tremendously with her IBS symptoms and her defecation she continues to use MiraLAX and prunes and has no ocular problems with defecation now though occasional straining she feels better overall.  When she wipes frequently she will see a slight amount of blood.  She is continuing diltiazem which helps her anal spasm and pain.  She is otherwise doing well.  She does note that when she eats nuts "I love knots" I get stomach upset and I take a Pepcid.  She is no longer taking align and wonders if she needs to. Allergies  Allergen Reactions  . Alendronate Sodium     upset stomach  . Penicillins     Has patient had a PCN reaction causing immediate rash, facial/tongue/throat swelling, SOB or lightheadedness with hypotension: Yes Has patient had a PCN reaction causing severe rash involving mucus membranes or skin necrosis: No Has patient had a PCN reaction that required hospitalization: No Has patient had a PCN  reaction occurring within the last 10 years: No If all of the above answers are "NO", then may proceed with Cephalosporin use.   Marland Kitchen Amoxicillin Rash    Has patient had a PCN reaction causing immediate rash, facial/tongue/throat swelling, SOB or lightheadedness with hypotension: yes Has patient had a PCN reaction causing severe rash involving mucus membranes or skin necrosis: no Has patient had a PCN reaction that required hospitalization: no Has patient had PCN reaction within the last 10 years: no  If all of the above answers are "NO", then may proceed with Cephalosporin use.  REACTION: unspecified  . Metronidazole Other (See Comments)    Pounding headaches patient says was bad.  With cipro for diverticulitis rx. Other reaction(s): Other (See Comments) Pounding headaches patient says was bad.With cipro for diverticulitis rx.   Current Meds  Medication Sig  . acetaminophen (TYLENOL) 500 MG tablet Take 500 mg by mouth every 6 (six) hours as needed for mild pain, moderate pain or headache.  . AMBULATORY NON FORMULARY MEDICATION Medication Name: Diltiazem 2% with lidocaine 5% Insert a pea size amount into rectum to the first knuckle TID  . Ascorbic Acid (VITAMIN C) 100 MG tablet Take 100 mg by mouth daily.    . calcium carbonate (OS-CAL) 1250 (500 Ca) MG chewable tablet Chew 1 tablet by mouth 2 (two) times daily.  Marland Kitchen conjugated estrogens (PREMARIN) vaginal cream Place 1 Applicatorful vaginally daily as  needed (dryness).  Marland Kitchen diltiazem (CARDIZEM CD) 120 MG 24 hr capsule TAKE 1 CAPSULE BY MOUTH EVERY DAY  . ELIQUIS 5 MG TABS tablet TAKE 1 TABLET BY MOUTH TWICE A DAY  . fish oil-omega-3 fatty acids 1000 MG capsule Take 1 g by mouth daily.   . fluticasone (FLONASE) 50 MCG/ACT nasal spray Place 2 sprays into both nostrils daily.  Marland Kitchen guaiFENesin (MUCINEX) 600 MG 12 hr tablet Take 600 mg by mouth 2 (two) times daily as needed (allergies).  . loratadine (CLARITIN) 10 MG tablet Take 10 mg by mouth  daily as needed for allergies.  Marland Kitchen MULTIPLE VITAMIN PO Take 1 tablet by mouth daily.   . polyethylene glycol (MIRALAX / GLYCOLAX) packet Take 17 g by mouth daily.  . Probiotic Product (ALIGN PO) Take 1 tablet by mouth daily.  . RESTASIS 0.05 % ophthalmic emulsion Place 1 drop into both eyes 2 (two) times daily.   Past Medical History:  Diagnosis Date  . Abdominal pain 12/2009   hospitalized  . Allergic rhinitis   . Chronic constipation   . Compression fx, lumbar spine ([place])   . DDD (degenerative disc disease), lumbar   . Diverticulosis of colon   . Dog bite(E906.0) 08/30/2011  . GERD (gastroesophageal reflux disease)   . Hepatic steatosis   . Hyperlipidemia   . Hyperplastic colon polyp   . IBS (irritable bowel syndrome)    constipation predominant  . Internal hemorrhoids   . Osteoporosis    dexa 2011 -2.7 hip nl spine  . PAF (paroxysmal atrial fibrillation) ([place])    a. dx 05/2017.  Marland Kitchen PMR (polymyalgia rheumatica) (HCC) 08/30/2011   under rheum care  on low dose pred 5 mg   . Polymyalgia rheumatica ([place])   . Renal disorder    Past Surgical History:  Procedure Laterality Date  . ABDOMINAL HYSTERECTOMY    . CATARACT EXTRACTION  2010   both  . COLONOSCOPY  2016 was most recent   Social History   Social History Narrative   Retired   Regular exercise- yes   Widowed   At home with children and GKs    HH of 2     Puppy   Plays cards is social and active    family history includes Bladder Cancer in her brother; Other in her mother and sister.   Review of Systems As above  Objective:   Physical Exam BP 112/70   Pulse 72   Ht 4' 11"  (1.499 m)   Wt 164 lb (74.4 kg)   BMI 33.12 kg/m  NAD Eyes anicteric Spry elderly woman Alert and oriented x 3  Pricilla Riffle, LPN present  Rectal - medium-large fleshy tags DRE - tender anal stenosis no mass 5% liodcaine and 0.125% NTG applied - relieved pain from DRE

## 2017-11-04 DIAGNOSIS — L812 Freckles: Secondary | ICD-10-CM | POA: Diagnosis not present

## 2017-11-04 DIAGNOSIS — L820 Inflamed seborrheic keratosis: Secondary | ICD-10-CM | POA: Diagnosis not present

## 2017-11-04 DIAGNOSIS — L821 Other seborrheic keratosis: Secondary | ICD-10-CM | POA: Diagnosis not present

## 2017-11-04 DIAGNOSIS — Z85828 Personal history of other malignant neoplasm of skin: Secondary | ICD-10-CM | POA: Diagnosis not present

## 2017-11-04 DIAGNOSIS — L570 Actinic keratosis: Secondary | ICD-10-CM | POA: Diagnosis not present

## 2018-01-09 ENCOUNTER — Other Ambulatory Visit: Payer: Self-pay | Admitting: Physician Assistant

## 2018-01-20 ENCOUNTER — Ambulatory Visit: Payer: Self-pay | Admitting: *Deleted

## 2018-01-20 ENCOUNTER — Other Ambulatory Visit: Payer: Self-pay

## 2018-01-20 ENCOUNTER — Encounter (HOSPITAL_COMMUNITY): Payer: Self-pay

## 2018-01-20 ENCOUNTER — Emergency Department (HOSPITAL_COMMUNITY)
Admission: EM | Admit: 2018-01-20 | Discharge: 2018-01-20 | Disposition: A | Discharge status: Home / Self Care | Payer: Medicare Other | Attending: Emergency Medicine | Admitting: Emergency Medicine

## 2018-01-20 DIAGNOSIS — Z85828 Personal history of other malignant neoplasm of skin: Secondary | ICD-10-CM | POA: Insufficient documentation

## 2018-01-20 DIAGNOSIS — R040 Epistaxis: Secondary | ICD-10-CM | POA: Diagnosis not present

## 2018-01-20 DIAGNOSIS — Z79899 Other long term (current) drug therapy: Secondary | ICD-10-CM | POA: Insufficient documentation

## 2018-01-20 MED ORDER — OXYMETAZOLINE HCL 0.05 % NA SOLN
1.0000 | Freq: Once | NASAL | Status: AC
  Administered 2018-01-20: 1 via NASAL
  Filled 2018-01-20: qty 15

## 2018-01-20 NOTE — ED Provider Notes (Signed)
[facility] DEPT Provider Note   CSN: [id number] Arrival date & time: 01/20/18  1409     History   Chief Complaint Chief Complaint  Patient presents with  . Epistaxis    HPI Ashley Lawson is a 84 y.o. female.  84 year old female with past medical history below including atrial fibrillation on Eliquis who presents with epistaxis.  Approximately 1 hour prior to arrival, she began spontaneously bleeding from her left nostril.  She has had some mild postnasal drip, sinus pressure, and dry eyes related to allergies.  She denies any trauma.  Her bleeding continued at home so she came to the ED. while in the ED waiting room, she had Afrin and then later passed 2 large clots.  She was given more Afrin and for the past 1 hour and 45 minutes the bleeding has stopped.  She denies any pain.  This is never happened before.  No therapies tried prior to arrival.  The history is provided by the patient.  Epistaxis      Past Medical History:  Diagnosis Date  . Abdominal pain 12/2009   hospitalized  . Allergic rhinitis   . Chronic constipation   . Compression fx, lumbar spine ([place])   . DDD (degenerative disc disease), lumbar   . Diverticulosis of colon   . Dog bite(E906.0) 08/30/2011  . GERD (gastroesophageal reflux disease)   . Hepatic steatosis   . Hyperlipidemia   . Hyperplastic colon polyp   . IBS (irritable bowel syndrome)    constipation predominant  . Internal hemorrhoids   . Osteoporosis    dexa 2011 -2.7 hip nl spine  . PAF (paroxysmal atrial fibrillation) ([place])    a. dx 05/2017.  Marland Kitchen PMR (polymyalgia rheumatica) (HCC) 08/30/2011   under rheum care  on low dose pred 5 mg   . Polymyalgia rheumatica ([place])   . Renal disorder     Patient Active Problem List   Diagnosis Date Noted  . Atrial fibrillation with RVR ([place]) 05/02/2017  . Rhinitis, allergic 05/31/2015  . Renal cyst incidental  01/19/2015  . Heartburn 01/14/2014  . Gastroesophageal  reflux disease without esophagitis 01/14/2014  . Osteoporosis 01/14/2014  . IBS (irritable bowel syndrome)   . Nail, ingrown 06/22/2012  . Arthritis 02/19/2012  . Screening, lipid 02/18/2011  . Rash and nonspecific skin eruption 02/18/2011  . Visit for preventive health examination 02/18/2011  . Skin cancer 10/14/2010  . Abdominal bloating 10/08/2010  . CONSTIPATION, CHRONIC 01/15/2010  . Lower urinary tract symptoms (LUTS) 01/15/2010  . SUPRAPUBIC PAIN 08/01/2009  . ABDOMINAL PAIN, EPIGASTRIC 06/16/2009  . DIVERTICULOSIS, COLON 06/07/2007  . Hyperlipidemia 06/05/2007  . ANXIETY STATE, UNSPECIFIED 06/05/2007  . Allergic rhinitis 06/05/2007  . IBS 06/05/2007  . OSTEOPOROSIS 06/05/2007  . ABDOMINAL PAIN, GENERALIZED 06/05/2007    Past Surgical History:  Procedure Laterality Date  . ABDOMINAL HYSTERECTOMY    . CATARACT EXTRACTION  2010   both  . COLONOSCOPY  2016 was most recent     OB History   None      Home Medications    Prior to Admission medications   Medication Sig Start Date End Date Taking? Authorizing Provider  acetaminophen (TYLENOL) 500 MG tablet Take 500 mg by mouth every 6 (six) hours as needed for mild pain, moderate pain or headache.   Yes [provider]  acetaminophen (TYLENOL) 650 MG CR tablet Take 650 mg by mouth daily as needed for pain.   Yes [provider]  Ascorbic Acid (VITAMIN C) 100 MG tablet Take 100 mg by mouth daily.     Yes [provider]  calcium carbonate (OS-CAL) 1250 (500 Ca) MG chewable tablet Chew 1 tablet by mouth 2 (two) times daily.   Yes [provider]  conjugated estrogens (PREMARIN) vaginal cream Place 1 Applicatorful vaginally daily as needed (dryness).   Yes [provider]  diltiazem (CARDIZEM CD) 120 MG 24 hr capsule TAKE 1 CAPSULE BY MOUTH EVERY DAY 01/09/18  Yes Dunn, Dayna N, PA-C  ELIQUIS 5 MG TABS tablet TAKE 1 TABLET BY MOUTH TWICE A DAY 08/26/17  Yes Burnell Blanks, MD  fluticasone (FLONASE) 50 MCG/ACT nasal spray Place 2 sprays into both nostrils daily.   Yes [provider]  guaiFENesin (MUCINEX) 600 MG 12 hr tablet Take 600 mg by mouth 2 (two) times daily as needed (allergies).   Yes [provider]  loratadine (CLARITIN) 10 MG tablet Take 10 mg by mouth daily as needed for allergies.   Yes [provider]  magnesium hydroxide (MILK OF MAGNESIA) 400 MG/5ML suspension Take 15 mLs by mouth daily as needed for mild constipation.   Yes [provider]  MULTIPLE VITAMIN PO Take 1 tablet by mouth daily.    Yes [provider]  polyethylene glycol (MIRALAX / GLYCOLAX) packet Take 17 g by mouth daily.   Yes [provider]  RESTASIS 0.05 % ophthalmic emulsion Place 1 drop into both eyes 2 (two) times daily. 10/26/14  Yes [provider]  AMBULATORY NON FORMULARY MEDICATION Medication Name: Diltiazem 2% with lidocaine 5% Insert a pea size amount into rectum to the first knuckle TID Patient not taking: Reported on 01/20/2018 06/12/17   Gatha Mayer, MD    Family History Family History  Problem Relation Age of Onset  . Other Mother        fx hip  . Other Sister        fx hip  . Bladder Cancer Brother   . Colon cancer Neg Hx   . Esophageal cancer Neg Hx   . Stomach cancer Neg Hx   . Rectal cancer Neg Hx   . Liver cancer Neg Hx     Social History Social History   Tobacco Use  . Smoking status: Never Smoker  . Smokeless tobacco: Never Used  Substance Use Topics  . Alcohol use: Yes    Alcohol/week: 0.0 standard drinks    Comment: occ  . Drug use: No     Allergies   Alendronate sodium; Penicillins; Amoxicillin; and Metronidazole   Review of Systems Review of Systems  Constitutional: Negative for fever.  HENT: Positive for nosebleeds, postnasal drip, rhinorrhea and sinus pressure.   Gastrointestinal: Negative for vomiting.  Neurological: Negative for syncope.     Physical  Exam Updated Vital Signs BP (!) 163/92 (BP Location: Right Arm)   Pulse 84   Temp 98.1 F (36.7 C) (Oral)   Resp 16   Ht 5' (1.524 m)   Wt 75.8 kg   SpO2 100%   BMI 32.65 kg/m   Physical Exam  Constitutional: She is oriented to person, place, and time. She appears well-developed and well-nourished. No distress.  HENT:  Head: Normocephalic and atraumatic.  Dried blood left nostril, irritated mucosa medial side of left nostril with no active bleeding; oropharynx clear  Eyes: Conjunctivae are normal.  Neck: Neck supple.  Neurological: She is alert and oriented to person, place, and time.  Skin: Skin  is warm and dry.  Psychiatric: She has a normal mood and affect. Judgment normal.  Nursing note and vitals reviewed.    ED Treatments / Results  Labs (all labs ordered are listed, but only abnormal results are displayed) Labs Reviewed - No data to display  EKG None  Radiology No results found.  Procedures Procedures (including critical care time)  Medications Ordered in ED Medications  oxymetazoline (AFRIN) 0.05 % nasal spray 1 spray (1 spray Each Nare Given 01/20/18 1443)     Initial Impression / Assessment and Plan / ED Course  I have reviewed the triage vital signs and the nursing notes.  Pertinent labs & imaging results that were available during my care of the patient were reviewed by me and considered in my medical decision making (see chart for details).    And that she has stopped bleeding for the past 1.5 hours, I discussed options of watchful waiting versus placing packing now.  I discussed risks and benefits of packing.  Ultimately, patient preferred watchful waiting for now.  I have extensively educated on management at home including what to do if nose starts bleeding again and avoidance of nose blowing.  Bided with ENT follow-up as needed.  Recommended holding Eliquis for 24 hours.  She voiced understanding of return precautions. Final Clinical  Impressions(s) / ED Diagnoses   Final diagnoses:  Left-sided epistaxis    ED Discharge Orders    None       Lamanda Rudder, Wenda Overland, MD 01/20/18 2025

## 2018-01-20 NOTE — ED Triage Notes (Signed)
Pt states that approximately an hour ago, she started having a nose bleed from her left nostril. Pt states that she is on Eliquis. Pt states that the bleeding has let up.

## 2018-01-20 NOTE — ED Notes (Signed)
Afrin reapplied.

## 2018-01-20 NOTE — ED Notes (Signed)
Pt passed two large, dark clots via nostril. Currently  bleeding small amounts of bright red blood. Pt remain alert oriented. No c/o dizziness. Family with pt. Pt given addition pads for nasal bleeding. Escorted to lobby via w/c  to wait for acute care bed. Pt is aware that that this the plan for care.

## 2018-01-20 NOTE — Telephone Encounter (Signed)
  Pt called in c/o her nose bleeding "bad for 30 minutes now and I can't get it stopped".   "I'm on Eliquis".   "This has never happened before".    She was talking fast and audibly scarred.   Her friend was there with her. I did not do a complete triage as I instructed her to go to the ED now since she is on Eliquis and has been bleeding for 30 minutes now.    She mentioned she is soaking through Kleenex. Her friend is going to take her to [facility] ED now.    I routed a note to Dr. Regis Bill so she would be aware.   Reason for Disposition . [1] Bleeding present > 30 minutes AND [2] using correct method of direct pressure  Answer Assessment - Initial Assessment Questions 1. AMOUNT OF BLEEDING: "How bad is the bleeding?" "How much blood was lost?" "Has the bleeding stopped?"   - MILD: needed a couple tissues   - MODERATE: needed many tissues   - SEVERE: large blood clots, soaked many tissues, lasted more than 30 minutes   Severe.   My nose is bleeding bad.   2. ONSET: "When did the nosebleed start?"      Over 30 minutes.  I can't get it to stop.   I'm on Eliquis. 3. FREQUENCY: "How many nosebleeds have you had in the last 24 hours?"      This just started out of the blue.   I've never had this happen before. 4. RECURRENT SYMPTOMS: "Have there been other recent nosebleeds?" If so, ask: "How long did it take you to stop the bleeding?" "What worked best?"      No 5. CAUSE: "What do you think caused this nosebleed?"     I'm on Eliquis.   This has never happened before. 6. LOCAL FACTORS: "Do you have any cold symptoms?", "Have you been rubbing or picking at your nose?"     Not asked.   Sent to the ED. 7. SYSTEMIC FACTORS: "Do you have high blood pressure or any bleeding problems?"     Not asked sent to ED 8. BLOOD THINNERS: "Do you take any blood thinners?" (e.g., coumadin, heparin, aspirin, Plavix)     Yes  Eliquis 9. OTHER SYMPTOMS: "Do you have any other symptoms?" (e.g.,  lightheadedness)     "I can't get it to stop bleeding".   "I'm soaking Kleenex as fast as I can change them for 30 minutes now". 10. PREGNANCY: "Is there any chance you are pregnant?" "When was your last menstrual period?"       Not asked  Protocols used: NOSEBLEED-A-AH

## 2018-01-20 NOTE — Telephone Encounter (Signed)
Dr. Regis Bill - FYI. Thanks.

## 2018-01-20 NOTE — Discharge Instructions (Addendum)
No noseblowing. Sneeze through your mouth. Sleep propped up on pillows. If your nose starts bleeding again, clear out all clots and spray Afrin in the side that is bleeding.  Then hold pressure continuously for approximately 20 minutes.  Repeat again if still bleeding.  If bleeding is severe or will not stop at home, return immediately to the ER. Use nasal saline twice daily and use humidifier at home. Do not take Eliquis for 24 hours.

## 2018-01-30 DIAGNOSIS — R040 Epistaxis: Secondary | ICD-10-CM | POA: Diagnosis not present

## 2018-01-30 DIAGNOSIS — D68318 Other hemorrhagic disorder due to intrinsic circulating anticoagulants, antibodies, or inhibitors: Secondary | ICD-10-CM | POA: Diagnosis not present

## 2018-01-30 DIAGNOSIS — J302 Other seasonal allergic rhinitis: Secondary | ICD-10-CM | POA: Diagnosis not present

## 2018-01-30 DIAGNOSIS — D689 Coagulation defect, unspecified: Secondary | ICD-10-CM | POA: Insufficient documentation

## 2018-01-30 DIAGNOSIS — J342 Deviated nasal septum: Secondary | ICD-10-CM | POA: Insufficient documentation

## 2018-02-04 ENCOUNTER — Telehealth: Payer: Self-pay | Admitting: Cardiovascular Disease

## 2018-02-04 NOTE — Telephone Encounter (Signed)
OK to hold her Eliquis for several weeks to let the epistaxis resolve. She has not had a prior stroke. She is on Eliquis for stroke risk reduction with paroxysmal atrial fib. Gerald Stabs

## 2018-02-04 NOTE — Telephone Encounter (Signed)
I spoke with pt's daughter. She reports pt is still having nosebleeds.  Have happened four out of the last 5 days.  Occurs once a day.  Bleeds are now stopping faster than before.  Will now stop after 20 minutes.  They are following recommendations from ENT.  Daughter is asking how long to let bleeding continue to occur and if any changes to medication needs to be made.

## 2018-02-04 NOTE — Telephone Encounter (Signed)
I spoke with pt's daughter and gave her instructions from Dr. Angelena Form.  If bleeding resolves pt will try resuming Eliquis after several weeks.

## 2018-02-04 NOTE — Telephone Encounter (Signed)
New message   Pt c/o medication issue:  1. Name of Medication: Eliquis  2. How are you currently taking this medication (dosage and times per day)? 1 in morning and 1 at night  3. Are you having a reaction (difficulty breathing--STAT)? No   4. What is your medication issue? Patient's daughter states that the patient  has had 7 nose bleeds within the last few days. Patient's wonders if the Eliquis is causing this condition? Please advise.

## 2018-02-06 ENCOUNTER — Ambulatory Visit: Payer: Medicare Other | Admitting: Internal Medicine

## 2018-02-13 NOTE — Progress Notes (Signed)
Chief Complaint  Patient presents with  . Follow-up    Pt having severe dry eyes. Pt also not on eliquis currently due to nose bleeds - Pt states that since being off she has been having "little" headaches and cheek flushing (in AMs) - wants to discuss getting back on something so that she is not at risk for a stroke.     HPI: Ashley Lawson 84 y.o. come in forfu advice about her medication  After epistaxis  On  Eliquis . Here with daughter today .  Saw Dr Wilburn Cornelia after trip to ed for  Prolonged left epistaxis    On  Anticoagulation rx for a fib . No hx of other bleeding .   hsa nasal allergy vs trhinitis and was on INCS   No fever or pain   Was told to use salinea nd gel and fu if needed  . Cardiology had her hold med  For ? 3-4 weeks . Since off for almost  3 weeks  No recurrence .  Using humidifier .  Concern about being off blood thinner for  Too long  Cause of stroke risk .  No cp sob syncope.   Has dry eyes to see eye doc soon   Please check ua   Brought from home just in case   Had some improvement when had PT for  Urinary issues  .  Not doing recently  ROS: See pertinent positives and negatives per HPI. No cp sob gi bleed  Gross hematuria  Past Medical History:  Diagnosis Date  . Abdominal pain 12/2009   hospitalized  . Allergic rhinitis   . Chronic constipation   . Compression fx, lumbar spine ([place])   . DDD (degenerative disc disease), lumbar   . Diverticulosis of colon   . Dog bite(E906.0) 08/30/2011  . GERD (gastroesophageal reflux disease)   . Hepatic steatosis   . Hyperlipidemia   . Hyperplastic colon polyp   . IBS (irritable bowel syndrome)    constipation predominant  . Internal hemorrhoids   . Osteoporosis    dexa 2011 -2.7 hip nl spine  . PAF (paroxysmal atrial fibrillation) ([place])    a. dx 05/2017.  Marland Kitchen PMR (polymyalgia rheumatica) (H[facility]) 08/30/2011   under rheum care  on low dose pred 5 mg   . Polymyalgia rheumatica ([place])   . Renal disorder      Family History  Problem Relation Age of Onset  . Other Mother        fx hip  . Other Sister        fx hip  . Bladder Cancer Brother   . Colon cancer Neg Hx   . Esophageal cancer Neg Hx   . Stomach cancer Neg Hx   . Rectal cancer Neg Hx   . Liver cancer Neg Hx     Social History   Socioeconomic History  . Marital status: Widowed    Spouse name: Not on file  . Number of children: Not on file  . Years of education: Not on file  . Highest education level: Not on file  Occupational History  . Occupation: retired  Scientific laboratory technician  . Financial resource strain: Not on file  . Food insecurity:    Worry: Not on file    Inability: Not on file  . Transportation needs:    Medical: Not on file    Non-medical: Not on file  Tobacco Use  . Smoking status: Never Smoker  . Smokeless tobacco:  Never Used  Substance and Sexual Activity  . Alcohol use: Yes    Alcohol/week: 0.0 standard drinks    Comment: occ  . Drug use: No  . Sexual activity: Not on file  Lifestyle  . Physical activity:    Days per week: Not on file    Minutes per session: Not on file  . Stress: Not on file  Relationships  . Social connections:    Talks on phone: Not on file    Gets together: Not on file    Attends religious service: Not on file    Active member of club or organization: Not on file    Attends meetings of clubs or organizations: Not on file    Relationship status: Not on file  Other Topics Concern  . Not on file  Social History Narrative   Retired   Regular exercise- yes   Widowed   At home with children and GKs    HH of 2     Puppy   Plays cards is social and active     Outpatient Medications Prior to Visit  Medication Sig Dispense Refill  . acetaminophen (TYLENOL) 500 MG tablet Take 500 mg by mouth every 6 (six) hours as needed for mild pain, moderate pain or headache.    Marland Kitchen acetaminophen (TYLENOL) 650 MG CR tablet Take 650 mg by mouth daily as needed for pain.    Marland Kitchen AMBULATORY NON  FORMULARY MEDICATION Medication Name: Diltiazem 2% with lidocaine 5% Insert a pea size amount into rectum to the first knuckle TID 30 g 3  . Ascorbic Acid (VITAMIN C) 100 MG tablet Take 100 mg by mouth daily.      . calcium carbonate (OS-CAL) 1250 (500 Ca) MG chewable tablet Chew 1 tablet by mouth 2 (two) times daily.    Marland Kitchen conjugated estrogens (PREMARIN) vaginal cream Place 1 Applicatorful vaginally daily as needed (dryness).    Marland Kitchen diltiazem (CARDIZEM CD) 120 MG 24 hr capsule TAKE 1 CAPSULE BY MOUTH EVERY DAY 30 capsule 6  . fluticasone (FLONASE) 50 MCG/ACT nasal spray Place 2 sprays into both nostrils daily.    Marland Kitchen guaiFENesin (MUCINEX) 600 MG 12 hr tablet Take 600 mg by mouth 2 (two) times daily as needed (allergies).    . loratadine (CLARITIN) 10 MG tablet Take 10 mg by mouth daily as needed for allergies.    . magnesium hydroxide (MILK OF MAGNESIA) 400 MG/5ML suspension Take 15 mLs by mouth daily as needed for mild constipation.    . MULTIPLE VITAMIN PO Take 1 tablet by mouth daily.     . polyethylene glycol (MIRALAX / GLYCOLAX) packet Take 17 g by mouth daily.    . RESTASIS 0.05 % ophthalmic emulsion Place 1 drop into both eyes 2 (two) times daily.  3  . ELIQUIS 5 MG TABS tablet TAKE 1 TABLET BY MOUTH TWICE A DAY (Patient not taking: Reported on 02/16/2018) 180 tablet 2   No facility-administered medications prior to visit.      EXAM:  BP 122/70 (BP Location: Left Arm, Patient Position: Sitting, Cuff Size: Normal)   Pulse 83   Temp 98.1 F (36.7 C) (Oral)   Wt 169 lb 3.2 oz (76.7 kg)   BMI 33.04 kg/m   Body mass index is 33.04 kg/m.  GENERAL: vitals reviewed and listed above, alert, oriented, appears well hydrated and in no acute distress HEENT: atraumatic, conjunctiva  clear, no obvious abnormalities on inspection of external nose and ears nares  Some  deviation septum  No active blood left nostril some  reddens at [place] a bit during visit   OP : no lesion edema or  exudate  NECK: no obvious masses on inspection palpation  LUNGS: clear to auscultation bilaterally, no wheezes, rales or rhonchi,  CV: HR irreg  To reg  Rate 80 range , no clubbing cyanosis or  peripheral edema nl cap refill  MS: moves all extremities without noticeable focal  Abnormality  OA changes   Skin: normal capillary refill ,turgor , color: No acute rashes ,petechiae or bruising  PSYCH: pleasant and cooperative, no obvious depression or anxiety  BP Readings from Last 3 Encounters:  02/16/18 122/70  01/20/18 (!) 163/92  10/01/17 112/70    ASSESSMENT AND PLAN:  Discussed the following assessment and plan:  Epistaxis, recurrent - 3 weeks out  they should touch base with cards   about restarting  eliquis same or lower dose but think from  nasal irritation  and dryness  Urinary frequency - Plan: POC Urinalysis Dipstick  Deviated septum  History of anticoagulant therapy  Atrial fibrillation, unspecified type (HCC)  Dry eyes  Rhinitis, unspecified type  -Patient advised to return or notify health care team  if  new concerns arise. Total visit 22mns > 50% spent counseling and coordinating care as indicated in above note and in instructions to patient .  Patient Instructions  Continue  Hydration of moisturizing of nose .    Agree with   Stopping the  Nasal cortisone but continue  Saline   Nose spray. Gently   And nasal gel.  To keep the sensitive nose tissues   moisturized   Will send  Note to   Cardiology about when to start back.  On  eliquis and ? At same dose .    And you can contact them about this.   Get back to doing the Physical therapy for the bladder .  Make appt for a physical  Exam and  Lab  In January  As possible   Keep appt with eye check and get them to send [place] results.        WStandley Brooking Nehal Witting M.D.

## 2018-02-16 ENCOUNTER — Encounter: Payer: Self-pay | Admitting: Internal Medicine

## 2018-02-16 ENCOUNTER — Ambulatory Visit: Payer: Medicare Other | Admitting: Internal Medicine

## 2018-02-16 VITALS — BP 122/70 | HR 83 | Temp 98.1°F | Wt 169.2 lb

## 2018-02-16 DIAGNOSIS — H04123 Dry eye syndrome of bilateral lacrimal glands: Secondary | ICD-10-CM

## 2018-02-16 DIAGNOSIS — R350 Frequency of micturition: Secondary | ICD-10-CM

## 2018-02-16 DIAGNOSIS — Z9229 Personal history of other drug therapy: Secondary | ICD-10-CM | POA: Diagnosis not present

## 2018-02-16 DIAGNOSIS — J342 Deviated nasal septum: Secondary | ICD-10-CM

## 2018-02-16 DIAGNOSIS — R040 Epistaxis: Secondary | ICD-10-CM

## 2018-02-16 DIAGNOSIS — I4891 Unspecified atrial fibrillation: Secondary | ICD-10-CM

## 2018-02-16 DIAGNOSIS — J310 Chronic rhinitis: Secondary | ICD-10-CM

## 2018-02-16 LAB — POCT URINALYSIS DIPSTICK
Bilirubin, UA: NEGATIVE
Glucose, UA: NEGATIVE
Ketones, UA: NEGATIVE
LEUKOCYTES UA: NEGATIVE
NITRITE UA: NEGATIVE
Odor: POSITIVE
PH UA: 5.5 (ref 5.0–8.0)
Protein, UA: POSITIVE — AB
RBC UA: POSITIVE
Spec Grav, UA: 1.015 (ref 1.010–1.025)
UROBILINOGEN UA: 0.2 U/dL

## 2018-02-16 NOTE — Patient Instructions (Addendum)
Continue  Hydration of moisturizing of nose .    Agree with   Stopping the  Nasal cortisone but continue  Saline   Nose spray. Gently   And nasal gel.  To keep the sensitive nose tissues   moisturized   Will send  Note to   Cardiology about when to start back.  On  eliquis and ? At same dose .    And you can contact them about this.   Get back to doing the Physical therapy for the bladder .  Make appt for a physical  Exam and  Lab  In January  As possible   Keep appt with eye check and get them to send [place] copy of results.

## 2018-02-17 ENCOUNTER — Telehealth: Payer: Self-pay | Admitting: Cardiovascular Disease

## 2018-02-17 NOTE — Telephone Encounter (Signed)
Fraser Din, Can we call Ashley Lawson and let her know she can resume her Eliquis at 5 mg po BID if her epistaxis is resolved? See note from her primary care provider Dr. Regis Bill.   Thanks,  Gerald Stabs

## 2018-02-18 DIAGNOSIS — H04123 Dry eye syndrome of bilateral lacrimal glands: Secondary | ICD-10-CM | POA: Diagnosis not present

## 2018-02-18 MED ORDER — APIXABAN 5 MG PO TABS: 5.0000 mg | ORAL_TABLET | Freq: Two times a day (BID) | ORAL | 2 refills | Status: DC

## 2018-02-18 NOTE — Addendum Note (Signed)
Addended by: Thompson Grayer on: 02/18/2018 09:57 AM   Modules accepted: Orders

## 2018-02-18 NOTE — Telephone Encounter (Signed)
I spoke with pt's daughter and gave her information from Dr. Angelena Form.  Pt will resume Eliquis today.

## 2018-02-18 NOTE — Telephone Encounter (Signed)
I placed call to pt's daughter and left message to call office.

## 2018-02-19 ENCOUNTER — Ambulatory Visit: Payer: Self-pay

## 2018-02-19 NOTE — Telephone Encounter (Signed)
Patient called and wanted to ask Dr. Regis Bill if installing an air duct santizer will help with her nasal dryness. I advised I will send this to Dr. Regis Bill for review and someone will call with her recommendation. She asks to be called before 1000, because she can get it done the same day, if she chooses.

## 2018-02-20 NOTE — Telephone Encounter (Signed)
This was sent to Nurse Triage at the [facility] as pt said this was urgent. This is NOT considered a triage call.

## 2018-02-20 NOTE — Telephone Encounter (Signed)
  I am not aware  Of any credible medical  Evidence that that would help  Your condition.   There is no evidence that   Major Cleaning of air ducts helps either..  So would not advise doing this .

## 2018-02-20 NOTE — Telephone Encounter (Signed)
Please advise Dr Panosh, thanks.   

## 2018-02-24 NOTE — Telephone Encounter (Signed)
Spoke with patient, states that she appreciates the recommendations but she has already done duct cleaning.  Nothing further needed.

## 2018-04-07 NOTE — Progress Notes (Signed)
Chief Complaint  Patient presents with  . Annual Exam    urinary frequency at night. Brought in specimen     HPI: Ashley Lawson 85 y.o. comes in today for Preventive Medicare exam/ wellness visit here with daughter    Concern about inc frequency today and llq tender as in past  Has   A appt with  uro today afternoon   Sinus congestion cough for a few weeks  Right nose feels more clogged has been seen in past for Nose bleed  And has  Deviated septum.     Health Maintenance  Topic Date Due  . MAMMOGRAM  09/26/2019  . TETANUS/TDAP  08/29/2021  . INFLUENZA VACCINE  Completed  . DEXA SCAN  Completed  . PNA vac Low Risk Adult  Completed   Health Maintenance Review LIFESTYLE:  Exercise:  Walks hasnt been t opool Tobacco/ETS:n Alcohol: n Sugar beverages:pepsi ocass rare  Sleep:6 hours   Urinary interruption Drug use: no HH:2  Daughter no pets    Hearing: ok  Vision:  No limitations at present . Last eye check UTD  Safety:  Has smoke detector and wears seat belts.  No firearms. No excess sun exposure. Sees dentist regularly.  Falls: n  Memory: Felt to be good  , no concern from her or her family.  Depression: No anhedonia unusual crying or depressive symptoms  Nutrition: Eats well balanced diet; adequate calcium and vitamin D. No swallowing chewing problems.  Injury: no major injuries in the last six months.  Other healthcare providers:  Reviewed today .  Preventive parameters:  Reviewed   ADLS:   There are no problems or need for assistance  driving, feeding, obtaining food, dressing, toileting and bathing, managing money using phone. She is independent.  ROS:  GEN/ HEENT: No fever, significant weight changes sweats headaches vision problems hearing changes, CV/ PULM; No chest pain shortness of breath cough, syncope,edema  change in exercise tolerance. GI /GU: No  vomiting, change in bowel habits. No blood in the stool. SKIN/HEME: ,no acute skin rashes  suspicious lesions or bleeding. No lymphadenopathy, nodules, masses.  NEURO/ PSYCH:  No neurologic signs such as weakness numbness. No depression anxiety. IMM/ Allergy: No unusual infections.  Allergy .   REST of 12 system review negative except as per HPI   Past Medical History:  Diagnosis Date  . Abdominal pain 12/2009   hospitalized  . Allergic rhinitis   . Chronic constipation   . Compression fx, lumbar spine ([place])   . DDD (degenerative disc disease), lumbar   . Diverticulosis of colon   . Dog bite(E906.0) 08/30/2011  . GERD (gastroesophageal reflux disease)   . Hepatic steatosis   . Hyperlipidemia   . Hyperplastic colon polyp   . IBS (irritable bowel syndrome)    constipation predominant  . Internal hemorrhoids   . Osteoporosis    dexa 2011 -2.7 hip nl spine  . PAF (paroxysmal atrial fibrillation) ([place])    a. dx 05/2017.  Marland Kitchen PMR (polymyalgia rheumatica) ([facility]) 08/30/2011   under rheum care  on low dose pred 5 mg   . Polymyalgia rheumatica ([place])   . Renal disorder     Family History  Problem Relation Age of Onset  . Other Mother        fx hip  . Other Sister        fx hip  . Bladder Cancer Brother   . Colon cancer Neg Hx   . Esophageal cancer Neg  Hx   . Stomach cancer Neg Hx   . Rectal cancer Neg Hx   . Liver cancer Neg Hx     Social History   Socioeconomic History  . Marital status: Widowed    Spouse name: Not on file  . Number of children: Not on file  . Years of education: Not on file  . Highest education level: Not on file  Occupational History  . Occupation: retired  Scientific laboratory technician  . Financial resource strain: Not on file  . Food insecurity:    Worry: Not on file    Inability: Not on file  . Transportation needs:    Medical: Not on file    Non-medical: Not on file  Tobacco Use  . Smoking status: Never Smoker  . Smokeless tobacco: Never Used  Substance and Sexual Activity  . Alcohol use: Yes    Alcohol/week: 0.0 standard drinks    Comment: occ    . Drug use: No  . Sexual activity: Not on file  Lifestyle  . Physical activity:    Days per week: Not on file    Minutes per session: Not on file  . Stress: Not on file  Relationships  . Social connections:    Talks on phone: Not on file    Gets together: Not on file    Attends religious service: Not on file    Active member of club or organization: Not on file    Attends meetings of clubs or organizations: Not on file    Relationship status: Not on file  Other Topics Concern  . Not on file  Social History Narrative   Retired   Regular exercise- yes   Widowed   At home with children and GKs    HH of 2     Puppy   Plays cards is social and active     Outpatient Encounter Medications as of 04/08/2018  Medication Sig  . lactobacillus acidophilus (BACID) TABS tablet Take 1 tablet by mouth daily.  Marland Kitchen acetaminophen (TYLENOL) 500 MG tablet Take 500 mg by mouth every 6 (six) hours as needed for mild pain, moderate pain or headache.  Marland Kitchen acetaminophen (TYLENOL) 650 MG CR tablet Take 650 mg by mouth daily as needed for pain.  Marland Kitchen AMBULATORY NON FORMULARY MEDICATION Medication Name: Diltiazem 2% with lidocaine 5% Insert a pea size amount into rectum to the first knuckle TID  . apixaban (ELIQUIS) 5 MG TABS tablet Take 1 tablet (5 mg total) by mouth 2 (two) times daily.  . Ascorbic Acid (VITAMIN C) 100 MG tablet Take 100 mg by mouth daily.    . calcium carbonate (OS-CAL) 1250 (500 Ca) MG chewable tablet Chew 1 tablet by mouth 2 (two) times daily.  Marland Kitchen conjugated estrogens (PREMARIN) vaginal cream Place 1 Applicatorful vaginally daily as needed (dryness).  Marland Kitchen diltiazem (CARDIZEM CD) 120 MG 24 hr capsule TAKE 1 CAPSULE BY MOUTH EVERY DAY  . fluticasone (FLONASE) 50 MCG/ACT nasal spray Place 2 sprays into both nostrils daily.  Marland Kitchen guaiFENesin (MUCINEX) 600 MG 12 hr tablet Take 600 mg by mouth 2 (two) times daily as needed (allergies).  . loratadine (CLARITIN) 10 MG tablet Take 10 mg by mouth daily as  needed for allergies.  . magnesium hydroxide (MILK OF MAGNESIA) 400 MG/5ML suspension Take 15 mLs by mouth daily as needed for mild constipation.  . MULTIPLE VITAMIN PO Take 1 tablet by mouth daily.   . polyethylene glycol (MIRALAX / GLYCOLAX) packet Take 17 g  by mouth daily.  . RESTASIS 0.05 % ophthalmic emulsion Place 1 drop into both eyes 2 (two) times daily.   No facility-administered encounter medications on file as of 04/08/2018.     EXAM:  BP 138/82 (BP Location: Right Arm, Patient Position: Sitting, Cuff Size: Normal)   Pulse 80   Temp 98.3 F (36.8 C) (Oral)   Ht 4' 11.75" (1.518 m)   Wt 162 lb 11.2 oz (73.8 kg)   BMI 32.04 kg/m   Body mass index is 32.04 kg/m.  Physical Exam: Vital signs reviewed NAT:FTDD is a well-developed well-nourished alert cooperative   who appears stated age in no acute distress.  HEENT: normocephalic atraumatic , Eyes: PERRL EOM's full, conjunctiva clear, Nares: paten,t no  discharge ? Mild right  tenderness., Ears: no deformity EAC's clear TMs with normal landmarks. Mouth: clear OP, no lesions, edema.  Moist mucous membranes. Dentition in adequate repair. NECK: supple without masses, thyromegaly or bruits. CHEST/PULM:  Clear to auscultation and percussion breath sounds equal no wheeze , rales or rhonchi.Breast: normal by inspection . No dimpling, discharge, masses, tenderness or discharge . CV: PMI is nondisplaced, S1 S2 no gallops r ir rate  no, murmurs, rubs. Peripheral pulses are present  without delay.No JVD .  ABDOMEN: Bowel sounds normal nontender  No guard or rebound, no hepato splenomegal no CVA tenderness.   Min llq tender no mass Extremtities:  No clubbing cyanosis or edema, no acute joint swelling or redness no focal atrophy joint djd changes in hands  NEURO:  Oriented x3, cranial nerves 3-12 appear to be intact, no obvious focal weakness,gait within normal limits n SKIN: No acute rashes normal turgor, color, no bruising or  petechiae. PSYCH: Oriented, good eye contact, no obvious depression anxiety, cognition and judgment appear normal. LN: no cervical axillary  adenopathy No noted deficits in memory, attention, and speech.   Lab Results  Component Value Date   WBC 7.4 04/08/2018   HGB 13.9 04/08/2018   HCT 40.9 04/08/2018   PLT 283.0 04/08/2018   GLUCOSE 94 04/08/2018   CHOL 197 04/08/2018   TRIG 139.0 04/08/2018   HDL 63.50 04/08/2018   LDLDIRECT 125.8 02/23/2013   LDLCALC 105 (H) 04/08/2018   ALT 20 04/08/2018   AST 30 04/08/2018   NA 137 04/08/2018   K 4.4 04/08/2018   CL 101 04/08/2018   CREATININE 0.91 04/08/2018   BUN 9 04/08/2018   CO2 26 04/08/2018   TSH 3.670 05/28/2017  ua pos leuk  Send for cx   ASSESSMENT AND PLAN:  Discussed the following assessment and plan:  Visit for preventive health examination  Medication management - Plan: Basic metabolic panel, CBC with Differential/Platelet, Hepatic function panel, Lipid panel, POCT Urinalysis Dipstick (Automated)  Atrial fibrillation, unspecified type ([place]) - Plan: Basic metabolic panel, CBC with Differential/Platelet, Hepatic function panel, Lipid panel, POCT Urinalysis Dipstick (Automated)  Osteoporosis, unspecified osteoporosis type, unspecified pathological fracture presence - Plan: Basic metabolic panel, CBC with Differential/Platelet, Hepatic function panel, Lipid panel, POCT Urinalysis Dipstick (Automated)  History of UTI - Plan: Basic metabolic panel, CBC with Differential/Platelet, Hepatic function panel, Lipid panel, POCT Urinalysis Dipstick (Automated), Urine Culture  Irritable bowel syndrome, unspecified type - Plan: Basic metabolic panel, CBC with Differential/Platelet, Hepatic function panel, Lipid panel, POCT Urinalysis Dipstick (Automated)  Urinary frequency - Plan: Basic metabolic panel, CBC with Differential/Platelet, Hepatic function panel, Lipid panel, POCT Urinalysis Dipstick (Automated), Urine  Culture  Anticoagulant long-term use - Plan: Basic metabolic panel, CBC with  Differential/Platelet, Hepatic function panel, Lipid panel, POCT Urinalysis Dipstick (Automated)  Cough, persistent - poss  post nasal drainage  poss mild chronic sinuis  right > consider  seeing ent  because of  prev hx ( has seen dr Lucia Gaskins in past - Plan: Basic metabolic panel, CBC with Differential/Platelet, Hepatic function panel, Lipid panel, POCT Urinalysis Dipstick (Automated)  Other rhinitis See plan and fu  Blood monitoring   To see uro today  Patient Care Team: Marlaya Turck, Standley Brooking, MD as PCP - General Angelena Form Annita Brod, MD as PCP - Cardiology (Cardiology) Bjorn Loser, MD (Urology) Particia Nearing, MD (Dermatology) Luberta Mutter, MD (Ophthalmology) Almedia Balls, MD (Orthopedic Surgery) Ouida Sills  (Rheumatology) Jarome Matin, MD as Consulting Physician (Dermatology)  Patient Instructions  I think your cough is from post nasal dranage from the sinuses  Continue saline  Add afrin x 2 days  Can try mucinex  Plain  And claritin short term 'your lung exam is normal but if on going  Cough contact us and we may get a chest x ry to be sure not surprises .  I suggest seeing ENT if ongoing nasal and sinus issues .  Limit carbonated beverages if having bladder issues .   Will notify you  of labs when available.     Preventive Care 53 Years and Older, Female Preventive care refers to lifestyle choices and visits with your health care provider that can promote health and wellness. What does preventive care include?  A yearly physical exam. This is also called an annual well check.  Dental exams once or twice a year.  Routine eye exams. Ask your health care provider how often you should have your eyes checked.  Personal lifestyle choices, including: ? Daily care of your teeth and gums. ? Regular physical activity. ? Eating a healthy diet. ? Avoiding tobacco and drug use. ? Limiting alcohol  use. ? Practicing safe sex. ? Taking low-dose aspirin every day. ? Taking vitamin and mineral supplements as recommended by your health care provider. What happens during an annual well check? The services and screenings done by your health care provider during your annual well check will depend on your age, overall health, lifestyle risk factors, and family history of disease. Counseling Your health care provider may ask you questions about your:  Alcohol use.  Tobacco use.  Drug use.  Emotional well-being.  Home and relationship well-being.  Sexual activity.  Eating habits.  History of falls.  Memory and ability to understand (cognition).  Work and work Statistician.  Reproductive health.  Screening You may have the following tests or measurements:  Height, weight, and BMI.  Blood pressure.  Lipid and cholesterol levels. These may be checked every 5 years, or more frequently if you are over 46 years old.  Skin check.  Lung cancer screening. You may have this screening every year starting at age 57 if you have a 30-pack-year history of smoking and currently smoke or have quit within the past 15 years.  Colorectal cancer screening. All adults should have this screening starting at age 28 and continuing until age 102. You will have tests every 1-10 years, depending on your results and the type of screening test. People at increased risk should start screening at an earlier age. Screening tests may include: ? Guaiac-based fecal occult blood testing. ? Fecal immunochemical test (FIT). ? Stool DNA test. ? Virtual colonoscopy. ? Sigmoidoscopy. During this test, a flexible tube with a tiny camera (sigmoidoscope) is used to  examine your rectum and lower colon. The sigmoidoscope is inserted through your anus into your rectum and lower colon. ? Colonoscopy. During this test, a long, thin, flexible tube with a tiny camera (colonoscope) is used to examine your entire colon and  rectum.  Hepatitis C blood test.  Hepatitis B blood test.  Sexually transmitted disease (STD) testing.  Diabetes screening. This is done by checking your blood sugar (glucose) after you have not eaten for a while (fasting). You may have this done every 1-3 years.  Bone density scan. This is done to screen for osteoporosis. You may have this done starting at age 57.  Mammogram. This may be done every 1-2 years. Talk to your health care provider about how often you should have regular mammograms. Talk with your health care provider about your test results, treatment options, and if necessary, the need for more tests. Vaccines Your health care provider may recommend certain vaccines, such as:  Influenza vaccine. This is recommended every year.  Tetanus, diphtheria, and acellular pertussis (Tdap, Td) vaccine. You may need a Td booster every 10 years.  Varicella vaccine. You may need this if you have not been vaccinated.  Zoster vaccine. You may need this after age 2.  Measles, mumps, and rubella (MMR) vaccine. You may need at least one dose of MMR if you were born in 1957 or later. You may also need a second dose.  Pneumococcal 13-valent conjugate (PCV13) vaccine. One dose is recommended after age 37.  Pneumococcal polysaccharide (PPSV23) vaccine. One dose is recommended after age 61.  Meningococcal vaccine. You may need this if you have certain conditions.  Hepatitis A vaccine. You may need this if you have certain conditions or if you travel or work in places where you may be exposed to hepatitis A.  Hepatitis B vaccine. You may need this if you have certain conditions or if you travel or work in places where you may be exposed to hepatitis B.  Haemophilus influenzae type b (Hib) vaccine. You may need this if you have certain conditions. Talk to your health care provider about which screenings and vaccines you need and how often you need them. This information is not intended to  replace advice given to you by your health care provider. Make sure you discuss any questions you have with your health care provider. Document Released: 04/14/2015 Document Revised: 05/08/2017 Document Reviewed: 01/17/2015 Elsevier Interactive Patient Education  2019 [place] K. Dimitrious Micciche M.D.

## 2018-04-08 ENCOUNTER — Encounter: Payer: Self-pay | Admitting: Internal Medicine

## 2018-04-08 ENCOUNTER — Ambulatory Visit (INDEPENDENT_AMBULATORY_CARE_PROVIDER_SITE_OTHER): Payer: Medicare Other | Admitting: Internal Medicine

## 2018-04-08 VITALS — BP 138/82 | HR 80 | Temp 98.3°F | Ht 59.75 in | Wt 162.7 lb

## 2018-04-08 DIAGNOSIS — Z79899 Other long term (current) drug therapy: Secondary | ICD-10-CM

## 2018-04-08 DIAGNOSIS — R05 Cough: Secondary | ICD-10-CM

## 2018-04-08 DIAGNOSIS — N8110 Cystocele, unspecified: Secondary | ICD-10-CM | POA: Diagnosis not present

## 2018-04-08 DIAGNOSIS — I4891 Unspecified atrial fibrillation: Secondary | ICD-10-CM

## 2018-04-08 DIAGNOSIS — Z0000 Encounter for general adult medical examination without abnormal findings: Secondary | ICD-10-CM

## 2018-04-08 DIAGNOSIS — R3121 Asymptomatic microscopic hematuria: Secondary | ICD-10-CM | POA: Diagnosis not present

## 2018-04-08 DIAGNOSIS — R109 Unspecified abdominal pain: Secondary | ICD-10-CM | POA: Diagnosis not present

## 2018-04-08 DIAGNOSIS — J310 Chronic rhinitis: Secondary | ICD-10-CM

## 2018-04-08 DIAGNOSIS — Z0001 Encounter for general adult medical examination with abnormal findings: Secondary | ICD-10-CM | POA: Diagnosis not present

## 2018-04-08 DIAGNOSIS — M810 Age-related osteoporosis without current pathological fracture: Secondary | ICD-10-CM

## 2018-04-08 DIAGNOSIS — Z87440 Personal history of urinary (tract) infections: Secondary | ICD-10-CM | POA: Diagnosis not present

## 2018-04-08 DIAGNOSIS — R053 Chronic cough: Secondary | ICD-10-CM

## 2018-04-08 DIAGNOSIS — R350 Frequency of micturition: Secondary | ICD-10-CM

## 2018-04-08 DIAGNOSIS — K589 Irritable bowel syndrome without diarrhea: Secondary | ICD-10-CM

## 2018-04-08 DIAGNOSIS — R8271 Bacteriuria: Secondary | ICD-10-CM | POA: Diagnosis not present

## 2018-04-08 DIAGNOSIS — R1012 Left upper quadrant pain: Secondary | ICD-10-CM | POA: Diagnosis not present

## 2018-04-08 DIAGNOSIS — N390 Urinary tract infection, site not specified: Secondary | ICD-10-CM | POA: Diagnosis not present

## 2018-04-08 DIAGNOSIS — Z7901 Long term (current) use of anticoagulants: Secondary | ICD-10-CM

## 2018-04-08 LAB — CBC WITH DIFFERENTIAL/PLATELET
Basophils Absolute: 0.1 10*3/uL (ref 0.0–0.1)
Basophils Relative: 1 % (ref 0.0–3.0)
EOS PCT: 0.6 % (ref 0.0–5.0)
Eosinophils Absolute: 0 10*3/uL (ref 0.0–0.7)
HCT: 40.9 % (ref 36.0–46.0)
Hemoglobin: 13.9 g/dL (ref 12.0–15.0)
LYMPHS ABS: 1.4 10*3/uL (ref 0.7–4.0)
Lymphocytes Relative: 19.3 % (ref 12.0–46.0)
MCHC: 33.9 g/dL (ref 30.0–36.0)
MCV: 93.5 fl (ref 78.0–100.0)
MONOS PCT: 7.3 % (ref 3.0–12.0)
Monocytes Absolute: 0.5 10*3/uL (ref 0.1–1.0)
NEUTROS PCT: 71.8 % (ref 43.0–77.0)
Neutro Abs: 5.3 10*3/uL (ref 1.4–7.7)
PLATELETS: 283 10*3/uL (ref 150.0–400.0)
RBC: 4.38 Mil/uL (ref 3.87–5.11)
RDW: 13.3 % (ref 11.5–15.5)
WBC: 7.4 10*3/uL (ref 4.0–10.5)

## 2018-04-08 LAB — BASIC METABOLIC PANEL
BUN: 9 mg/dL (ref 6–23)
CO2: 26 meq/L (ref 19–32)
Calcium: 9.5 mg/dL (ref 8.4–10.5)
Chloride: 101 mEq/L (ref 96–112)
Creatinine, Ser: 0.91 mg/dL (ref 0.40–1.20)
GFR: 62.63 mL/min (ref >60.00)
GLUCOSE: 94 mg/dL (ref 70–99)
POTASSIUM: 4.4 meq/L (ref 3.5–5.1)
SODIUM: 137 meq/L (ref 135–145)

## 2018-04-08 LAB — POC URINALSYSI DIPSTICK (AUTOMATED)
BILIRUBIN UA: NEGATIVE
GLUCOSE UA: NEGATIVE
KETONES UA: NEGATIVE
PROTEIN UA: POSITIVE — AB
Spec Grav, UA: 1.015 (ref 1.010–1.025)
Urobilinogen, UA: 0.2 E.U./dL
pH, UA: 7.5 (ref 5.0–8.0)

## 2018-04-08 LAB — HEPATIC FUNCTION PANEL
ALBUMIN: 4.3 g/dL (ref 3.5–5.2)
ALT: 20 U/L (ref 0–35)
AST: 30 U/L (ref 0–37)
Alkaline Phosphatase: 63 U/L (ref 39–117)
Bilirubin, Direct: 0.1 mg/dL (ref 0.0–0.3)
Total Bilirubin: 0.7 mg/dL (ref 0.2–1.2)
Total Protein: 7.1 g/dL (ref 6.0–8.3)

## 2018-04-08 LAB — LIPID PANEL
CHOLESTEROL: 197 mg/dL (ref 0–200)
HDL: 63.5 mg/dL (ref >39.00)
LDL Cholesterol: 105 mg/dL — ABNORMAL HIGH (ref 0–99)
NonHDL: 133.12
TRIGLYCERIDES: 139 mg/dL (ref 0.0–149.0)
Total CHOL/HDL Ratio: 3
VLDL: 27.8 mg/dL (ref 0.0–40.0)

## 2018-04-08 NOTE — Patient Instructions (Signed)
I think your cough is from post nasal dranage from the sinuses  Continue saline  Add afrin x 2 days  Can try mucinex  Plain  And claritin short term 'your lung exam is normal but if on going  Cough contact us and we may get a chest x ry to be sure not surprises .  I suggest seeing ENT if ongoing nasal and sinus issues .  Limit carbonated beverages if having bladder issues .   Will notify you  of labs when available.     Preventive Care 83 Years and Older, Female Preventive care refers to lifestyle choices and visits with your health care provider that can promote health and wellness. What does preventive care include?  A yearly physical exam. This is also called an annual well check.  Dental exams once or twice a year.  Routine eye exams. Ask your health care provider how often you should have your eyes checked.  Personal lifestyle choices, including: ? Daily care of your teeth and gums. ? Regular physical activity. ? Eating a healthy diet. ? Avoiding tobacco and drug use. ? Limiting alcohol use. ? Practicing safe sex. ? Taking low-dose aspirin every day. ? Taking vitamin and mineral supplements as recommended by your health care provider. What happens during an annual well check? The services and screenings done by your health care provider during your annual well check will depend on your age, overall health, lifestyle risk factors, and family history of disease. Counseling Your health care provider may ask you questions about your:  Alcohol use.  Tobacco use.  Drug use.  Emotional well-being.  Home and relationship well-being.  Sexual activity.  Eating habits.  History of falls.  Memory and ability to understand (cognition).  Work and work Statistician.  Reproductive health.  Screening You may have the following tests or measurements:  Height, weight, and BMI.  Blood pressure.  Lipid and cholesterol levels. These may be checked every 5 years, or more  frequently if you are over 12 years old.  Skin check.  Lung cancer screening. You may have this screening every year starting at age 75 if you have a 30-pack-year history of smoking and currently smoke or have quit within the past 15 years.  Colorectal cancer screening. All adults should have this screening starting at age 52 and continuing until age 47. You will have tests every 1-10 years, depending on your results and the type of screening test. People at increased risk should start screening at an earlier age. Screening tests may include: ? Guaiac-based fecal occult blood testing. ? Fecal immunochemical test (FIT). ? Stool DNA test. ? Virtual colonoscopy. ? Sigmoidoscopy. During this test, a flexible tube with a tiny camera (sigmoidoscope) is used to examine your rectum and lower colon. The sigmoidoscope is inserted through your anus into your rectum and lower colon. ? Colonoscopy. During this test, a long, thin, flexible tube with a tiny camera (colonoscope) is used to examine your entire colon and rectum.  Hepatitis C blood test.  Hepatitis B blood test.  Sexually transmitted disease (STD) testing.  Diabetes screening. This is done by checking your blood sugar (glucose) after you have not eaten for a while (fasting). You may have this done every 1-3 years.  Bone density scan. This is done to screen for osteoporosis. You may have this done starting at age 74.  Mammogram. This may be done every 1-2 years. Talk to your health care provider about how often you should have  regular mammograms. Talk with your health care provider about your test results, treatment options, and if necessary, the need for more tests. Vaccines Your health care provider may recommend certain vaccines, such as:  Influenza vaccine. This is recommended every year.  Tetanus, diphtheria, and acellular pertussis (Tdap, Td) vaccine. You may need a Td booster every 10 years.  Varicella vaccine. You may need this  if you have not been vaccinated.  Zoster vaccine. You may need this after age 5.  Measles, mumps, and rubella (MMR) vaccine. You may need at least one dose of MMR if you were born in 1957 or later. You may also need a second dose.  Pneumococcal 13-valent conjugate (PCV13) vaccine. One dose is recommended after age 26.  Pneumococcal polysaccharide (PPSV23) vaccine. One dose is recommended after age 69.  Meningococcal vaccine. You may need this if you have certain conditions.  Hepatitis A vaccine. You may need this if you have certain conditions or if you travel or work in places where you may be exposed to hepatitis A.  Hepatitis B vaccine. You may need this if you have certain conditions or if you travel or work in places where you may be exposed to hepatitis B.  Haemophilus influenzae type b (Hib) vaccine. You may need this if you have certain conditions. Talk to your health care provider about which screenings and vaccines you need and how often you need them. This information is not intended to replace advice given to you by your health care provider. Make sure you discuss any questions you have with your health care provider. Document Released: 04/14/2015 Document Revised: 05/08/2017 Document Reviewed: 01/17/2015 Elsevier Interactive Patient Education  2019 [employer].

## 2018-04-10 ENCOUNTER — Other Ambulatory Visit: Payer: Self-pay | Admitting: Internal Medicine

## 2018-04-10 LAB — URINE CULTURE
MICRO NUMBER: 28188
SPECIMEN QUALITY: ADEQUATE

## 2018-04-10 MED ORDER — SULFAMETHOXAZOLE-TRIMETHOPRIM 400-80 MG PO TABS: 1.0000 | ORAL_TABLET | Freq: Two times a day (BID) | ORAL | 0 refills | Status: DC

## 2018-04-10 NOTE — Progress Notes (Signed)
Pt has been notified.

## 2018-04-14 ENCOUNTER — Telehealth: Payer: Self-pay | Admitting: Cardiovascular Disease

## 2018-04-14 NOTE — Telephone Encounter (Signed)
I spoke with pt. She is coughing when speaking with me on phone. She reports she has a bladder infection, her nose is stopped up and she feels worn out and tired.  When I asked her about tightness in her chest she said this is new and just started today.  I advised pt she should go to ED for evaluation of chest pain as this is new for her.  Pt is hesitant to go but states she will if tightness does not improve.

## 2018-04-14 NOTE — Telephone Encounter (Signed)
Thanks

## 2018-04-14 NOTE — Telephone Encounter (Signed)
° ° °  Patient states she has "tired, tight  feeling in chest" No SOB

## 2018-04-24 ENCOUNTER — Encounter: Payer: Self-pay | Admitting: Physician Assistant

## 2018-04-28 ENCOUNTER — Encounter: Payer: Self-pay | Admitting: Internal Medicine

## 2018-04-28 ENCOUNTER — Ambulatory Visit: Payer: Medicare Other | Admitting: Internal Medicine

## 2018-04-28 VITALS — BP 122/64 | HR 85 | Temp 98.1°F | Wt 165.0 lb

## 2018-04-28 DIAGNOSIS — R399 Unspecified symptoms and signs involving the genitourinary system: Secondary | ICD-10-CM | POA: Diagnosis not present

## 2018-04-28 DIAGNOSIS — R1030 Lower abdominal pain, unspecified: Secondary | ICD-10-CM | POA: Diagnosis not present

## 2018-04-28 DIAGNOSIS — N390 Urinary tract infection, site not specified: Secondary | ICD-10-CM | POA: Diagnosis not present

## 2018-04-28 DIAGNOSIS — Z87440 Personal history of urinary (tract) infections: Secondary | ICD-10-CM

## 2018-04-28 DIAGNOSIS — Z79899 Other long term (current) drug therapy: Secondary | ICD-10-CM | POA: Diagnosis not present

## 2018-04-28 LAB — POCT URINALYSIS DIPSTICK
Bilirubin, UA: NEGATIVE
Glucose, UA: NEGATIVE
KETONES UA: NEGATIVE
Leukocytes, UA: NEGATIVE
NITRITE UA: NEGATIVE
PROTEIN UA: NEGATIVE
SPEC GRAV UA: 1.02 (ref 1.010–1.025)
Urobilinogen, UA: 0.2 E.U./dL
pH, UA: 6 (ref 5.0–8.0)

## 2018-04-28 NOTE — Patient Instructions (Signed)
  Your urine is better  Today . Will  send for culture  and no treatment  If the culture is positive we may    have you start the cipro  But not yet.   If left lower pain doesn't get better consider back to PT .

## 2018-04-28 NOTE — Progress Notes (Signed)
Chief Complaint  Patient presents with  . Follow-up    pt is here to follow up on uti. Pt states she is feeling better but states it still hurts "in there" believes it could be muscles possibly     HPI: Ashley Lawson 85 y.o. come in for follow-up of UTI and lower abdominal pain. Had uti with citrobactoer  Jan 8th discovered at her wellness CPX visit.  She was given antibiotic and feels a lot better although still has some left lower quadrant soreness of note that she had years ago that was proved after physical therapy. Saw urologist and PA after seeing me last time and they gave her a prescription for Cipro to 50 mg for 7 days.  She got that prescription filled but did not take it has it at home. She states she is a lot better than when she was here last.  Has a follow-up with urology on February 14 or thereabouts.  Otherwise is doing well please review labs. ROS: See pertinent positives and negatives per HPI.  Past Medical History:  Diagnosis Date  . Abdominal pain 12/2009   hospitalized  . Allergic rhinitis   . Chronic constipation   . Compression fx, lumbar spine ([place])   . DDD (degenerative disc disease), lumbar   . Diverticulosis of colon   . Dog bite(E906.0) 08/30/2011  . GERD (gastroesophageal reflux disease)   . Hepatic steatosis   . Hyperlipidemia   . Hyperplastic colon polyp   . IBS (irritable bowel syndrome)    constipation predominant  . Internal hemorrhoids   . Osteoporosis    dexa 2011 -2.7 hip nl spine  . PAF (paroxysmal atrial fibrillation) ([place])    a. dx 05/2017.  Marland Kitchen PMR (polymyalgia rheumatica) ([facility]) 08/30/2011   under rheum care  on low dose pred 5 mg   . Polymyalgia rheumatica ([place])   . Renal disorder     Family History  Problem Relation Age of Onset  . Other Mother        fx hip  . Other Sister        fx hip  . Bladder Cancer Brother   . Colon cancer Neg Hx   . Esophageal cancer Neg Hx   . Stomach cancer Neg Hx   . Rectal cancer Neg Hx   .  Liver cancer Neg Hx     Social History   Socioeconomic History  . Marital status: Widowed    Spouse name: Not on file  . Number of children: Not on file  . Years of education: Not on file  . Highest education level: Not on file  Occupational History  . Occupation: retired  Scientific laboratory technician  . Financial resource strain: Not on file  . Food insecurity:    Worry: Not on file    Inability: Not on file  . Transportation needs:    Medical: Not on file    Non-medical: Not on file  Tobacco Use  . Smoking status: Never Smoker  . Smokeless tobacco: Never Used  Substance and Sexual Activity  . Alcohol use: Yes    Alcohol/week: 0.0 standard drinks    Comment: occ  . Drug use: No  . Sexual activity: Not on file  Lifestyle  . Physical activity:    Days per week: Not on file    Minutes per session: Not on file  . Stress: Not on file  Relationships  . Social connections:    Talks on phone: Not on  file    Gets together: Not on file    Attends religious service: Not on file    Active member of club or organization: Not on file    Attends meetings of clubs or organizations: Not on file    Relationship status: Not on file  Other Topics Concern  . Not on file  Social History Narrative   Retired   Regular exercise- yes   Widowed   At home with children and GKs    HH of 2     Puppy   Plays cards is social and active     Outpatient Medications Prior to Visit  Medication Sig Dispense Refill  . acetaminophen (TYLENOL) 500 MG tablet Take 500 mg by mouth every 6 (six) hours as needed for mild pain, moderate pain or headache.    Marland Kitchen acetaminophen (TYLENOL) 650 MG CR tablet Take 650 mg by mouth daily as needed for pain.    Marland Kitchen AMBULATORY NON FORMULARY MEDICATION Medication Name: Diltiazem 2% with lidocaine 5% Insert a pea size amount into rectum to the first knuckle TID 30 g 3  . apixaban (ELIQUIS) 5 MG TABS tablet Take 1 tablet (5 mg total) by mouth 2 (two) times daily. 180 tablet 2  .  Ascorbic Acid (VITAMIN C) 100 MG tablet Take 100 mg by mouth daily.      . calcium carbonate (OS-CAL) 1250 (500 Ca) MG chewable tablet Chew 1 tablet by mouth 2 (two) times daily.    Marland Kitchen conjugated estrogens (PREMARIN) vaginal cream Place 1 Applicatorful vaginally daily as needed (dryness).    Marland Kitchen diltiazem (CARDIZEM CD) 120 MG 24 hr capsule TAKE 1 CAPSULE BY MOUTH EVERY DAY 30 capsule 6  . fluticasone (FLONASE) 50 MCG/ACT nasal spray Place 2 sprays into both nostrils daily.    Marland Kitchen guaiFENesin (MUCINEX) 600 MG 12 hr tablet Take 600 mg by mouth 2 (two) times daily as needed (allergies).    . lactobacillus acidophilus (BACID) TABS tablet Take 1 tablet by mouth daily.    Marland Kitchen loratadine (CLARITIN) 10 MG tablet Take 10 mg by mouth daily as needed for allergies.    . magnesium hydroxide (MILK OF MAGNESIA) 400 MG/5ML suspension Take 15 mLs by mouth daily as needed for mild constipation.    . MULTIPLE VITAMIN PO Take 1 tablet by mouth daily.     . polyethylene glycol (MIRALAX / GLYCOLAX) packet Take 17 g by mouth daily.    . RESTASIS 0.05 % ophthalmic emulsion Place 1 drop into both eyes 2 (two) times daily.  3  . sulfamethoxazole-trimethoprim (BACTRIM) 400-80 MG tablet Take 1 tablet by mouth 2 (two) times daily. 10 tablet 0   No facility-administered medications prior to visit.      EXAM:  BP 122/64 (BP Location: Right Arm, Patient Position: Sitting, Cuff Size: Normal)   Pulse 85   Temp 98.1 F (36.7 C) (Oral)   Wt 165 lb (74.8 kg)   BMI 32.49 kg/m   Body mass index is 32.49 kg/m.  GENERAL: vitals reviewed and listed above, alert, oriented, appears well hydrated and in no acute distress looks well today. MS: moves all extremities without noticeable focal  abnormality PSYCH: pleasant and cooperative, no obvious depression or anxiety  UA negative except for 3+ dipstick blood sent for culture most all of the urines that we have collected by dipstick show blood. Reviewed lab results from earlier this  month with patient. Lab Results  Component Value Date   WBC 7.4 04/08/2018  HGB 13.9 04/08/2018   HCT 40.9 04/08/2018   PLT 283.0 04/08/2018   GLUCOSE 94 04/08/2018   CHOL 197 04/08/2018   TRIG 139.0 04/08/2018   HDL 63.50 04/08/2018   LDLDIRECT 125.8 02/23/2013   LDLCALC 105 (H) 04/08/2018   ALT 20 04/08/2018   AST 30 04/08/2018   NA 137 04/08/2018   K 4.4 04/08/2018   CL 101 04/08/2018   CREATININE 0.91 04/08/2018   BUN 9 04/08/2018   CO2 26 04/08/2018   TSH 3.670 05/28/2017   BP Readings from Last 3 Encounters:  04/28/18 122/64  04/08/18 138/82  02/16/18 122/70    ASSESSMENT AND PLAN:  Discussed the following assessment and plan:  Recurrent UTI  History of UTI - Plan: POCT urinalysis dipstick, Culture, Urine  Medication management  Lower urinary tract symptoms (LUTS) She is much better after treatment but with atypical germ Citrobacter.  Will repeat culture let her know if we need to restart an antibiotic. Her residual left lower quadrant discomfort is similar to what she had in the past that resolved with urinary physical therapy.  If ongoing would suggest going through that again. She can follow-up with urologist for that. -Patient advised to return or notify health care team  if  new concerns arise.  Patient Instructions   Your urine is better  Today . Will  send for culture  and no treatment  If the culture is positive we may    have you start the cipro  But not yet.   If left lower pain doesn't get better consider back to PT .     Standley Brooking. Dorsey Charette M.D.

## 2018-04-29 LAB — URINE CULTURE
MICRO NUMBER:: 115715
SPECIMEN QUALITY: ADEQUATE

## 2018-05-05 DIAGNOSIS — R109 Unspecified abdominal pain: Secondary | ICD-10-CM | POA: Diagnosis not present

## 2018-05-05 DIAGNOSIS — N390 Urinary tract infection, site not specified: Secondary | ICD-10-CM | POA: Diagnosis not present

## 2018-05-05 DIAGNOSIS — R8271 Bacteriuria: Secondary | ICD-10-CM | POA: Diagnosis not present

## 2018-05-08 ENCOUNTER — Encounter (INDEPENDENT_AMBULATORY_CARE_PROVIDER_SITE_OTHER): Payer: Self-pay

## 2018-05-08 ENCOUNTER — Ambulatory Visit: Payer: Medicare Other | Admitting: Internal Medicine

## 2018-05-08 ENCOUNTER — Encounter: Payer: Self-pay | Admitting: Internal Medicine

## 2018-05-08 VITALS — BP 100/58 | HR 76 | Ht 59.0 in | Wt 163.4 lb

## 2018-05-08 DIAGNOSIS — R10814 Left lower quadrant abdominal tenderness: Secondary | ICD-10-CM | POA: Diagnosis not present

## 2018-05-08 DIAGNOSIS — K581 Irritable bowel syndrome with constipation: Secondary | ICD-10-CM | POA: Diagnosis not present

## 2018-05-08 DIAGNOSIS — K219 Gastro-esophageal reflux disease without esophagitis: Secondary | ICD-10-CM

## 2018-05-08 MED ORDER — DOXYCYCLINE HYCLATE 100 MG PO CAPS: 100.0000 mg | ORAL_CAPSULE | Freq: Two times a day (BID) | ORAL | 0 refills | Status: DC

## 2018-05-08 NOTE — Patient Instructions (Signed)
  We have sent the following medications to your pharmacy for you to pick up at your convenience: Doxycycline , take for 10 days   Take your Pepcid Complete daily before supper.   Emmit Alexanders with your physical therapy.   I appreciate the opportunity to care for you. Silvano Rusk, MD, [facility]

## 2018-05-08 NOTE — Progress Notes (Signed)
Ashley Lawson 85 y.o. 1933/02/19 [id number]  Assessment & Plan:   Encounter Diagnoses  Name Primary?  . Left lower quadrant abdominal tenderness without rebound tenderness Yes  . Irritable bowel syndrome with constipation   . Gastroesophageal reflux disease, esophagitis presence not specified    She could have a low-grade diverticulitis, imaging is not suggested that though the unenhanced CT scan from January is less sensitive to show that.  Doxycycline 100 mg twice daily for 10 days is prescribed to see if it helps.  I do not think repeat imaging or t endoscopic evaluation are indicated.  I do agree with urology that this left lower quadrant discomfort and tenderness is not genitourinary in origin.  She may have symptomatic diverticulosis.  She moves her bowels regularly and this may be the best that we can do.  I would be hesitant to prescribe an anticholinergic like dicyclomine.  Regarding her reflux issues I have advised her to take Pepcid Complete before supper every day to try to prevent her reflux and regurgitation problems.  I will see her back as needed  I appreciate the opportunity to care for this patient. CC: Panosh, Standley Brooking, MD Salley Slaughter, physician assistant [facility]  Subjective:   Chief Complaint: Left lower quadrant pain, reflux  HPI Ashley Lawson is here with her daughter today because of left lower quadrant pain.  She had been seen at [facility] u[facility] again and the physician assistant is setting her up for physical therapy again which has been quite helpful with her pelvic floor and bladder issues but she has had persistent left lower quadrant pain.  CT scan of the abdomen and pelvis without contrast on January 8 did not show any abnormalities, that would explain this.  Specifically no diverticulitis though she does have diverticulosis.  She takes MiraLAX and prune juice and moves her bowels regularly but sometimes still has to strain.  No fevers.  She notices that  when she does defecate she will have improvement in her abdominal pain and tenderness in the left lower quadrant as well as passage of flatus provides relief.  Sometimes this seems to keep her up at night though her nocturia is more of an issue.  Another problem is sporadic regurgitation of food after her evening meal.  Pepcid Complete will be taken and she will feel better.  She does not take it prophylactically.  She does not have dysphagia  She remains frustrated that she cannot do the activities of daily living and thinks she could do before her physical status has deteriorated some with age.  She is increasingly anxious.  A colonoscopy in 2016 by Dr. Olevia Perches demonstrated 3 diminutive polyps, they were hyperplastic.  She also had left-sided diverticulosis.  A CT scan of the abdomen and pelvis with contrast in January 2019 was negative for any acute findings.  She was recently treated for a UTI with trimethoprim sulfamethoxazole for 5 days.  She had a Citrobacter koseri infection and eradication was confirmed with subsequent repeat culture.  She has sensitivities or allergies to penicillins metronidazole and Cipro, when she was taking Cipro and metronidazole for diverticulitis in the past she developed severe headaches. Allergies  Allergen Reactions  . Alendronate Sodium     upset stomach  . Penicillins     Has patient had a PCN reaction causing immediate rash, facial/tongue/throat swelling, SOB or lightheadedness with hypotension: Yes Has patient had a PCN reaction causing severe rash involving mucus membranes or skin necrosis: No Has  patient had a PCN reaction that required hospitalization: No Has patient had a PCN reaction occurring within the last 10 years: No If all of the above answers are "NO", then may proceed with Cephalosporin use.   Marland Kitchen Amoxicillin Rash    Has patient had a PCN reaction causing immediate rash, facial/tongue/throat swelling, SOB or lightheadedness with hypotension:  yes Has patient had a PCN reaction causing severe rash involving mucus membranes or skin necrosis: no Has patient had a PCN reaction that required hospitalization: no Has patient had PCN reaction within the last 10 years: no  If all of the above answers are "NO", then may proceed with Cephalosporin use.  REACTION: unspecified  . Metronidazole Other (See Comments)    Pounding headaches patient says was bad.  With cipro for diverticulitis rx. Other reaction(s): Other (See Comments) Pounding headaches patient says was bad.With cipro for diverticulitis rx.   Current Meds  Medication Sig  . acetaminophen (TYLENOL) 500 MG tablet Take 500 mg by mouth every 6 (six) hours as needed for mild pain, moderate pain or headache.  Marland Kitchen acetaminophen (TYLENOL) 650 MG CR tablet Take 650 mg by mouth daily as needed for pain.  Marland Kitchen AMBULATORY NON FORMULARY MEDICATION Medication Name: Diltiazem 2% with lidocaine 5% Insert a pea size amount into rectum to the first knuckle TID  . apixaban (ELIQUIS) 5 MG TABS tablet Take 1 tablet (5 mg total) by mouth 2 (two) times daily.  . Ascorbic Acid (VITAMIN C) 100 MG tablet Take 100 mg by mouth daily.    . calcium carbonate (OS-CAL) 1250 (500 Ca) MG chewable tablet Chew 1 tablet by mouth 2 (two) times daily.  Marland Kitchen conjugated estrogens (PREMARIN) vaginal cream Place 1 Applicatorful vaginally daily as needed (dryness).  Marland Kitchen diltiazem (CARDIZEM CD) 120 MG 24 hr capsule TAKE 1 CAPSULE BY MOUTH EVERY DAY  . fluticasone (FLONASE) 50 MCG/ACT nasal spray Place 2 sprays into both nostrils daily.  Marland Kitchen guaiFENesin (MUCINEX) 600 MG 12 hr tablet Take 600 mg by mouth 2 (two) times daily as needed (allergies).  . loratadine (CLARITIN) 10 MG tablet Take 10 mg by mouth daily as needed for allergies.  . magnesium hydroxide (MILK OF MAGNESIA) 400 MG/5ML suspension Take 15 mLs by mouth daily as needed for mild constipation.  . MULTIPLE VITAMIN PO Take 1 tablet by mouth daily.   . polyethylene glycol  (MIRALAX / GLYCOLAX) packet Take 17 g by mouth daily.  . Probiotic Product (ALIGN) 4 MG CAPS Take 1 capsule by mouth daily.  . RESTASIS 0.05 % ophthalmic emulsion Place 1 drop into both eyes 2 (two) times daily.   Past Medical History:  Diagnosis Date  . Abdominal pain 12/2009   hospitalized  . Allergic rhinitis   . Chronic constipation   . Compression fx, lumbar spine ([place])   . DDD (degenerative disc disease), lumbar   . Diverticulosis of colon   . Dog bite(E906.0) 08/30/2011  . GERD (gastroesophageal reflux disease)   . Hepatic steatosis   . Hyperlipidemia   . Hyperplastic colon polyp   . IBS (irritable bowel syndrome)    constipation predominant  . Internal hemorrhoids   . Osteoporosis    dexa 2011 -2.7 hip nl spine  . PAF (paroxysmal atrial fibrillation) ([place])    a. dx 05/2017.  Marland Kitchen PMR (polymyalgia rheumatica) ([facility]) 08/30/2011   under rheum care  on low dose pred 5 mg   . Renal disorder    Past Surgical History:  Procedure Laterality Date  .  ABDOMINAL HYSTERECTOMY    . CATARACT EXTRACTION  2010   both  . COLONOSCOPY  2016 was most recent   Social History   Social History Narrative   Retired   Regular exercise- yes   Widowed   At home with children and GKs    HH of 2     Puppy   Plays cards is social and active    family history includes Bladder Cancer in her brother; Other in her mother and sister.   Review of Systems See HPI  Objective:   Physical Exam BP (!) 100/58 (BP Location: Left Arm, Patient Position: Sitting, Cuff Size: Normal)   Pulse 76   Ht 4' 11"  (1.499 m) Comment: height measured without shoes  Wt 163 lb 6 oz (74.1 kg)   BMI 33.00 kg/m  Elderly no acute distress Lungs clear Normal heart sounds The abdomen is mildly obese soft bowel sounds are present there is no organomegaly or mass or obvious hernia.  She is tender in the left lower quadrant, there is a negative carnett sign. She does seem slightly anxious but is otherwise appropriate and  mental status appears intact.

## 2018-05-12 DIAGNOSIS — M6289 Other specified disorders of muscle: Secondary | ICD-10-CM | POA: Diagnosis not present

## 2018-05-12 DIAGNOSIS — R1032 Left lower quadrant pain: Secondary | ICD-10-CM | POA: Diagnosis not present

## 2018-05-12 DIAGNOSIS — M62838 Other muscle spasm: Secondary | ICD-10-CM | POA: Diagnosis not present

## 2018-05-12 DIAGNOSIS — M6281 Muscle weakness (generalized): Secondary | ICD-10-CM | POA: Diagnosis not present

## 2018-05-12 DIAGNOSIS — R3916 Straining to void: Secondary | ICD-10-CM | POA: Diagnosis not present

## 2018-05-13 DIAGNOSIS — R0789 Other chest pain: Secondary | ICD-10-CM | POA: Insufficient documentation

## 2018-05-13 NOTE — Progress Notes (Signed)
Cardiology Office Note    Date:  05/15/2018  ID:  Ashley Lawson, DOB [birth year], MRN [id number] PCP:  Burnis Medin, MD  Cardiologist:  Lauree Chandler, MD   Chief Complaint: chest fatigue  History of Present Illness:  Ashley Lawson is a 85 y.o. female with history of allergic rhinitis, compression fx, DDD, diverticulosis, GERD, hepatic steatosis, IBS, HLD, internal hemorrhoids, PMR, PAF, mild MR who presents for acute visit. In early 2019 she was recovering from a sinus infection, UTI, and bladder procedure and was admitted with weakness. She was found to be in new onset atrial fib, perceived as a sensation of chest pressure/wobbliness. She was started on beta blocker and Eliquis and converted spontaneously to NSR. She was treated with normal saline for hyponatremia with improvement in sodium from 128->135 (appears to be chronic issue). 2D echo 05/02/17 showed EF 72-53%, mild MR, systolic bowing without prolapse, mildly dilated RA. When I saw her in f/u 05/2017, we switched metoprolol to diltiazem given fatigue and Raynaud's.   In November 2019 she was dealing with epistaxis and Eliquis was held. She became very nervous about stroke risk and saw her PCP back who noted rhythm was "irreg to reg." At that time she was also feeling vague chest tightness. Eliquis was restarted. She did not have an EKG done at that visit. She called in today to move up her 1 year follow-up to discuss that episode. She saw ENT and epistaxis resolved with management. Since that time she has not had any further chest tightness. She denies any palpitations, chest pain, SOB, orthopnea, edema. She is dealing with a bladder infection and diverticulitis being managed by uro and GI, respectively. Last labs 04/2018 showed K 4.4, Cr 0.91, Hgb 13.9, plt 283, LFTs wnl, LDL 105.   She is here w/ daughter today who helps with housework.    Past Medical History:  Diagnosis Date  . Abdominal pain 12/2009   hospitalized  .  Allergic rhinitis   . Chronic constipation   . Compression fx, lumbar spine ([place])   . DDD (degenerative disc disease), lumbar   . Diverticulosis of colon   . Dog bite(E906.0) 08/30/2011  . GERD (gastroesophageal reflux disease)   . Hepatic steatosis   . Hyperlipidemia   . Hyperplastic colon polyp   . IBS (irritable bowel syndrome)    constipation predominant  . Internal hemorrhoids   . Osteoporosis    dexa 2011 -2.7 hip nl spine  . PAF (paroxysmal atrial fibrillation) ([place])    a. dx 05/2017.  Marland Kitchen PMR (polymyalgia rheumatica) ([facility]) 08/30/2011   under rheum care  on low dose pred 5 mg   . Renal disorder     Past Surgical History:  Procedure Laterality Date  . ABDOMINAL HYSTERECTOMY    . CATARACT EXTRACTION  2010   both  . COLONOSCOPY  2016 was most recent    Current Medications: Current Meds  Medication Sig  . acetaminophen (TYLENOL) 500 MG tablet Take 500 mg by mouth every 6 (six) hours as needed for mild pain, moderate pain or headache.  . AMBULATORY NON FORMULARY MEDICATION Medication Name: Diltiazem 2% with lidocaine 5% Insert a pea size amount into rectum to the first knuckle TID  . apixaban (ELIQUIS) 5 MG TABS tablet Take 1 tablet (5 mg total) by mouth 2 (two) times daily.  . Ascorbic Acid (VITAMIN C) 100 MG tablet Take 100 mg by mouth daily.    . calcium carbonate (OS-CAL) 1250 (500 Ca)  MG chewable tablet Chew 1 tablet by mouth 2 (two) times daily.  Marland Kitchen conjugated estrogens (PREMARIN) vaginal cream Place 1 Applicatorful vaginally daily as needed (dryness).  Marland Kitchen diltiazem (CARDIZEM CD) 120 MG 24 hr capsule TAKE 1 CAPSULE BY MOUTH EVERY DAY  . doxycycline (VIBRAMYCIN) 100 MG capsule Take 1 capsule (100 mg total) by mouth 2 (two) times daily.  . fluticasone (FLONASE) 50 MCG/ACT nasal spray Place 2 sprays into both nostrils daily.  Marland Kitchen guaiFENesin (MUCINEX) 600 MG 12 hr tablet Take 600 mg by mouth 2 (two) times daily as needed (allergies).  . loratadine (CLARITIN) 10 MG tablet Take  10 mg by mouth daily as needed for allergies.  . magnesium hydroxide (MILK OF MAGNESIA) 400 MG/5ML suspension Take 15 mLs by mouth daily as needed for mild constipation.  . MULTIPLE VITAMIN PO Take 1 tablet by mouth daily.   . polyethylene glycol (MIRALAX / GLYCOLAX) packet Take 17 g by mouth daily.  . Probiotic Product (ALIGN) 4 MG CAPS Take 1 capsule by mouth daily.  . RESTASIS 0.05 % ophthalmic emulsion Place 1 drop into both eyes 2 (two) times daily.      Allergies:   Alendronate sodium; Penicillins; Amoxicillin; and Metronidazole   Social History   Socioeconomic History  . Marital status: Widowed    Spouse name: Not on file  . Number of children: Not on file  . Years of education: Not on file  . Highest education level: Not on file  Occupational History  . Occupation: retired  Scientific laboratory technician  . Financial resource strain: Not on file  . Food insecurity:    Worry: Not on file    Inability: Not on file  . Transportation needs:    Medical: Not on file    Non-medical: Not on file  Tobacco Use  . Smoking status: Never Smoker  . Smokeless tobacco: Never Used  Substance and Sexual Activity  . Alcohol use: Yes    Alcohol/week: 0.0 standard drinks    Comment: occ  . Drug use: No  . Sexual activity: Not on file  Lifestyle  . Physical activity:    Days per week: Not on file    Minutes per session: Not on file  . Stress: Not on file  Relationships  . Social connections:    Talks on phone: Not on file    Gets together: Not on file    Attends religious service: Not on file    Active member of club or organization: Not on file    Attends meetings of clubs or organizations: Not on file    Relationship status: Not on file  Other Topics Concern  . Not on file  Social History Narrative   Retired   Regular exercise- yes   Widowed   At home with children and GKs    HH of 2     Puppy   Plays cards is social and active      Family History:  The patient's family history  includes Bladder Cancer in her brother; Other in her mother and sister. There is no history of Colon cancer, Esophageal cancer, Stomach cancer, Rectal cancer, or Liver cancer.  ROS:   Please see the history of present illness. LLQ abd pain, urinary pain followed by above specialties. No acute change. All other systems are reviewed and otherwise negative.    PHYSICAL EXAM:   VS:  BP 122/70   Pulse 72   Ht 4' 11"  (1.499 m)   Wt 163  lb 12.8 oz (74.3 kg)   SpO2 98%   BMI 33.08 kg/m   BMI: Body mass index is 33.08 kg/m. GEN: Well nourished, well developed WF, in no acute distress HEENT: normocephalic, atraumatic Neck: no JVD, carotid bruits, or masses Cardiac: RRR; no murmurs, rubs, or gallops, no edema  Respiratory:  clear to auscultation bilaterally, normal work of breathing GI: soft, nontender, nondistended, + BS MS: no deformity or atrophy Skin: warm and dry, no rash Neuro:  Alert and Oriented x 3, Strength and sensation are intact, follows commands Psych: euthymic mood, full affect  Wt Readings from Last 3 Encounters:  05/15/18 163 lb 12.8 oz (74.3 kg)  05/08/18 163 lb 6 oz (74.1 kg)  04/28/18 165 lb (74.8 kg)      Studies/Labs Reviewed:   EKG:  EKG was ordered today and personally reviewed by me and demonstrates NSR 74bpm, low voltage QRS, no acute ST-T changes  Recent Labs: 05/28/2017: TSH 3.670 04/08/2018: ALT 20; BUN 9; Creatinine, Ser 0.91; Hemoglobin 13.9; Platelets 283.0; Potassium 4.4; Sodium 137   Lipid Panel    Component Value Date/Time   CHOL 197 04/08/2018 1019   TRIG 139.0 04/08/2018 1019   HDL 63.50 04/08/2018 1019   CHOLHDL 3 04/08/2018 1019   VLDL 27.8 04/08/2018 1019   LDLCALC 105 (H) 04/08/2018 1019   LDLDIRECT 125.8 02/23/2013 1000    Additional studies/ records that were reviewed today include: Summarized above  ASSESSMENT & PLAN:   1. Chest discomfort - occurred in 01/2018 in setting of possible recurrence of irregular HR at visit with  primary care. HR was reported to be normal at that visit but EKG not obtained. Hard to know what this represented. She is in NSR today and EKG without acute changes. She has been able to do all her ADLs and walk without any recurrent angina. At this time, given prior normal EF and no recurrent symptoms will follow clinically. If this recurs, we will need to consider ischemic testing i.e. Lexiscan stress test. Event monitoring would be low yield as these symptoms (recognized while irregular) have only happened twice so far in 1 year. Warning sx reviewed. She will notify our office of any recurrent symptoms. 2. Paroxysmal atrial fib - continue Eliquis. No further epistaxis since working with ENT. Discussed Jodelle Red as a way to track recurrence. 3. Mitral regurgitation - follow clinically for now, consider repeat echocardiogram in 1-2 years if indicated. 4. Hyperlipidemia - followed by PCP.  Disposition: F/u with Dr. Angelena Form in 6 months.  Medication Adjustments/Labs and Tests Ordered: Current medicines are reviewed at length with the patient today.  Concerns regarding medicines are outlined above. Medication changes, Labs and Tests ordered today are summarized above and listed in the Patient Instructions accessible in Encounters.   Signed, Charlie Pitter, PA-C  05/15/2018 2:52 PM    [facility] [address], [place], [place]  [address] Phone: [phone]; Fax: [phone]

## 2018-05-15 ENCOUNTER — Encounter: Payer: Self-pay | Admitting: Physician Assistant

## 2018-05-15 ENCOUNTER — Ambulatory Visit: Payer: Medicare Other | Admitting: Physician Assistant

## 2018-05-15 VITALS — BP 122/70 | HR 72 | Ht 59.0 in | Wt 163.8 lb

## 2018-05-15 DIAGNOSIS — R0789 Other chest pain: Secondary | ICD-10-CM

## 2018-05-15 DIAGNOSIS — I340 Nonrheumatic mitral (valve) insufficiency: Secondary | ICD-10-CM

## 2018-05-15 DIAGNOSIS — I480 Paroxysmal atrial fibrillation: Secondary | ICD-10-CM

## 2018-05-15 DIAGNOSIS — E785 Hyperlipidemia, unspecified: Secondary | ICD-10-CM | POA: Diagnosis not present

## 2018-05-15 NOTE — Patient Instructions (Addendum)
Medication Instructions:  Your physician recommends that you continue on your current medications as directed. Please refer to the Current Medication list given to you today.  If you need a refill on your cardiac medications before your next appointment, please call your pharmacy.   Lab work: None ordered  If you have labs (blood work) drawn today and your tests are completely normal, you will receive your results only by: Marland Kitchen MyChart Message (if you have MyChart) OR . A paper copy in the mail If you have any lab test that is abnormal or we need to change your treatment, we will call you to review the results.  Testing/Procedures: None ordered  Follow-Up: At [facility], you and your health needs are our priority.  As part of our continuing mission to provide you with exceptional heart care, we have created designated Provider Care Teams.  These Care Teams include your primary Cardiologist (physician) and Advanced Practice Providers (APPs -  Physician Assistants and Nurse Practitioners) who all work together to provide you with the care you need, when you need it. You will need a follow up appointment in 6 months.  Please call our office 2 months in advance to schedule this appointment.  You may see Lauree Chandler, MD or one of the following Advanced Practice Providers on your designated Care Team:   [place], PA-C Melina Copa, PA-C . Ermalinda Barrios, PA-C  Any Other Special Instructions Will Be Listed Below (If Applicable).

## 2018-05-18 ENCOUNTER — Other Ambulatory Visit: Payer: Self-pay | Admitting: Cardiovascular Disease

## 2018-05-19 DIAGNOSIS — R1032 Left lower quadrant pain: Secondary | ICD-10-CM | POA: Diagnosis not present

## 2018-05-19 DIAGNOSIS — N390 Urinary tract infection, site not specified: Secondary | ICD-10-CM | POA: Diagnosis not present

## 2018-05-19 DIAGNOSIS — R109 Unspecified abdominal pain: Secondary | ICD-10-CM | POA: Diagnosis not present

## 2018-05-19 DIAGNOSIS — M6281 Muscle weakness (generalized): Secondary | ICD-10-CM | POA: Diagnosis not present

## 2018-05-19 DIAGNOSIS — R3121 Asymptomatic microscopic hematuria: Secondary | ICD-10-CM | POA: Diagnosis not present

## 2018-05-19 DIAGNOSIS — M6289 Other specified disorders of muscle: Secondary | ICD-10-CM | POA: Diagnosis not present

## 2018-05-19 DIAGNOSIS — M62838 Other muscle spasm: Secondary | ICD-10-CM | POA: Diagnosis not present

## 2018-05-28 DIAGNOSIS — M6289 Other specified disorders of muscle: Secondary | ICD-10-CM | POA: Diagnosis not present

## 2018-05-28 DIAGNOSIS — R1032 Left lower quadrant pain: Secondary | ICD-10-CM | POA: Diagnosis not present

## 2018-05-28 DIAGNOSIS — M6281 Muscle weakness (generalized): Secondary | ICD-10-CM | POA: Diagnosis not present

## 2018-05-28 DIAGNOSIS — M62838 Other muscle spasm: Secondary | ICD-10-CM | POA: Diagnosis not present

## 2018-06-04 DIAGNOSIS — M6281 Muscle weakness (generalized): Secondary | ICD-10-CM | POA: Diagnosis not present

## 2018-06-04 DIAGNOSIS — M6289 Other specified disorders of muscle: Secondary | ICD-10-CM | POA: Diagnosis not present

## 2018-06-04 DIAGNOSIS — M62838 Other muscle spasm: Secondary | ICD-10-CM | POA: Diagnosis not present

## 2018-06-04 DIAGNOSIS — R3912 Poor urinary stream: Secondary | ICD-10-CM | POA: Diagnosis not present

## 2018-06-09 DIAGNOSIS — M62838 Other muscle spasm: Secondary | ICD-10-CM | POA: Diagnosis not present

## 2018-06-09 DIAGNOSIS — R1032 Left lower quadrant pain: Secondary | ICD-10-CM | POA: Diagnosis not present

## 2018-06-09 DIAGNOSIS — M6289 Other specified disorders of muscle: Secondary | ICD-10-CM | POA: Diagnosis not present

## 2018-06-09 DIAGNOSIS — M6281 Muscle weakness (generalized): Secondary | ICD-10-CM | POA: Diagnosis not present

## 2018-07-10 ENCOUNTER — Telehealth: Payer: Self-pay | Admitting: Internal Medicine

## 2018-07-10 NOTE — Telephone Encounter (Unsigned)
Copied from [place] [id number]. Topic: Quick Communication - Rx Refill/Question >> Jul 10, 2018  1:45 PM Mcneil, Ja-Kwan wrote: Medication: sulfamethoxazole-trimethoprim (BACTRIM) 400-80 MG tablet  Has the patient contacted their pharmacy? no  Preferred Pharmacy (with phone number or street name): [id number] [phone]Phone) [phone](Fax)  Agent: Please be advised that RX refills may take up to 3 business days. We ask that you follow-up with your pharmacy.

## 2018-07-13 ENCOUNTER — Encounter: Payer: Self-pay | Admitting: Internal Medicine

## 2018-07-13 ENCOUNTER — Other Ambulatory Visit: Payer: Self-pay

## 2018-07-13 ENCOUNTER — Ambulatory Visit (INDEPENDENT_AMBULATORY_CARE_PROVIDER_SITE_OTHER): Payer: Medicare Other | Admitting: Internal Medicine

## 2018-07-13 DIAGNOSIS — Z79899 Other long term (current) drug therapy: Secondary | ICD-10-CM | POA: Diagnosis not present

## 2018-07-13 DIAGNOSIS — Z7901 Long term (current) use of anticoagulants: Secondary | ICD-10-CM

## 2018-07-13 DIAGNOSIS — L853 Xerosis cutis: Secondary | ICD-10-CM

## 2018-07-13 DIAGNOSIS — J329 Chronic sinusitis, unspecified: Secondary | ICD-10-CM

## 2018-07-13 DIAGNOSIS — J342 Deviated nasal septum: Secondary | ICD-10-CM

## 2018-07-13 DIAGNOSIS — J309 Allergic rhinitis, unspecified: Secondary | ICD-10-CM

## 2018-07-13 DIAGNOSIS — Z87440 Personal history of urinary (tract) infections: Secondary | ICD-10-CM

## 2018-07-13 DIAGNOSIS — R399 Unspecified symptoms and signs involving the genitourinary system: Secondary | ICD-10-CM

## 2018-07-13 MED ORDER — DOXYCYCLINE HYCLATE 100 MG PO CAPS: 100.0000 mg | ORAL_CAPSULE | Freq: Two times a day (BID) | ORAL | 0 refills | Status: DC

## 2018-07-13 NOTE — Progress Notes (Signed)
Virtual Visit via Video Note  I connected with@ on 07/13/18 at 11:30 AM EDT by a video enabled telemedicine application and verified that I am speaking with the correct person using two identifiers. Location patient: home Location provider:work or home office Persons participating in the virtual visit: patient, provider  [place] national recommendations  regarding COVID 19 pandemic   video visit is advised over in office visit for this patient.  Discussed the limitations of evaluation and management by telemedicine and  availability of in person appointments. The patient expressed understanding and agreed to proceed.   HPI: Ashley Lawson  Hx of recurrnet uti allergy and isnus infection and bladder pain   She has had a number of weeks of sinus congestion and totally clogged right nasal passage.  She has a history of deviated septum and did have a nosebleed when she was on Flonase.  She has been using the saline and her Claritin but today has had a headache on the right side without associated fever or neurologic signs.  She has a slight amount of yellow drainage and postnasal drainage but no serious cough.  Since having to stop her physical therapy for her perineal and bladder pain which had improved her symptoms some of her symptoms of come back no burning on urination but does have frequency using small amounts of topical estrogen.  No blood in her urine noted.  Has a small flesh to light-colored patch on her lower extremity near her shin that she been using topical emollients on wonders if it is important no itching or bleeding.    ROS: See pertinent positives and negatives per HPI. No fever lower resp sx   Past Medical History:  Diagnosis Date  . Abdominal pain 12/2009   hospitalized  . Allergic rhinitis   . Chronic constipation   . Compression fx, lumbar spine ([place])   . DDD (degenerative disc disease), lumbar   . Diverticulosis of colon   . Dog bite(E906.0) 08/30/2011  .  GERD (gastroesophageal reflux disease)   . Hepatic steatosis   . Hyperlipidemia   . Hyperplastic colon polyp   . IBS (irritable bowel syndrome)    constipation predominant  . Internal hemorrhoids   . Osteoporosis    dexa 2011 -2.7 hip nl spine  . PAF (paroxysmal atrial fibrillation) ([place])    a. dx 05/2017.  Marland Kitchen PMR (polymyalgia rheumatica) ([facility]) 08/30/2011   under rheum care  on low dose pred 5 mg   . Renal disorder     Past Surgical History:  Procedure Laterality Date  . ABDOMINAL HYSTERECTOMY    . CATARACT EXTRACTION  2010   both  . COLONOSCOPY  2016 was most recent    Family History  Problem Relation Age of Onset  . Other Mother        fx hip  . Other Sister        fx hip  . Bladder Cancer Brother   . Colon cancer Neg Hx   . Esophageal cancer Neg Hx   . Stomach cancer Neg Hx   . Rectal cancer Neg Hx   . Liver cancer Neg Hx     SOCIAL HX:  At home daughter helping also   Current Outpatient Medications:  .  acetaminophen (TYLENOL) 500 MG tablet, Take 500 mg by mouth every 6 (six) hours as needed for mild pain, moderate pain or headache., Disp: , Rfl:  .  acetaminophen (TYLENOL) 650 MG CR tablet, Take 650 mg by mouth daily  as needed for pain., Disp: , Rfl:  .  AMBULATORY NON FORMULARY MEDICATION, Medication Name: Diltiazem 2% with lidocaine 5% Insert a pea size amount into rectum to the first knuckle TID, Disp: 30 g, Rfl: 3 .  Ascorbic Acid (VITAMIN C) 100 MG tablet, Take 100 mg by mouth daily.  , Disp: , Rfl:  .  calcium carbonate (OS-CAL) 1250 (500 Ca) MG chewable tablet, Chew 1 tablet by mouth 2 (two) times daily., Disp: , Rfl:  .  conjugated estrogens (PREMARIN) vaginal cream, Place 1 Applicatorful vaginally daily as needed (dryness)., Disp: , Rfl:  .  diltiazem (CARDIZEM CD) 120 MG 24 hr capsule, TAKE 1 CAPSULE BY MOUTH EVERY DAY, Disp: 30 capsule, Rfl: 6 .  doxycycline (VIBRAMYCIN) 100 MG capsule, Take 1 capsule (100 mg total) by mouth 2 (two) times daily. For  sinusitis, Disp: 20 capsule, Rfl: 0 .  ELIQUIS 5 MG TABS tablet, TAKE 1 TABLET BY MOUTH TWICE A DAY, Disp: 180 tablet, Rfl: 1 .  fluticasone (FLONASE) 50 MCG/ACT nasal spray, Place 2 sprays into both nostrils daily., Disp: , Rfl:  .  guaiFENesin (MUCINEX) 600 MG 12 hr tablet, Take 600 mg by mouth 2 (two) times daily as needed (allergies)., Disp: , Rfl:  .  loratadine (CLARITIN) 10 MG tablet, Take 10 mg by mouth daily as needed for allergies., Disp: , Rfl:  .  magnesium hydroxide (MILK OF MAGNESIA) 400 MG/5ML suspension, Take 15 mLs by mouth daily as needed for mild constipation., Disp: , Rfl:  .  MULTIPLE VITAMIN PO, Take 1 tablet by mouth daily. , Disp: , Rfl:  .  polyethylene glycol (MIRALAX / GLYCOLAX) packet, Take 17 g by mouth daily., Disp: , Rfl:  .  Probiotic Product (ALIGN) 4 MG CAPS, Take 1 capsule by mouth daily., Disp: , Rfl:  .  RESTASIS 0.05 % ophthalmic emulsion, Place 1 drop into both eyes 2 (two) times daily., Disp: , Rfl: 3  EXAM:  VITALS per patient if applicable:  GENERAL: alert, oriented, appears well and in no acute distress slightly congested no cough no puffiness of the face.  HEENT: atraumatic, conjunttiva clear, no obvious abnormalities on inspection of external nose and ears points to the right cheek is the area of tenderness.  NECK: normal movements of the head and neck  LUNGS: on inspection no signs of respiratory distress, breathing rate appears normal, no obvious gross SOB, gasping or wheezing  CV: no obvious cyanosis Skin lower extremity left shows some's senile skin changes no bruising bleeding red lesions. MS: moves all visible extremities without noticeable abnormality  PSYCH/NEURO: pleasant and cooperative, no obvious depression minimal  anxiety, speech and thought processing grossly intact  ASSESSMENT AND PLAN:  Discussed the following assessment and plan:  Recurrent sinusitis - risk benefot   50 50 hx of same add antibiotic for now and cont sinus  hygiene  Medication management  Allergic rhinitis, unspecified seasonality, unspecified trigger  History of UTI  Lower urinary tract symptoms (LUTS) - doesnt sound like infection today PT had helped   Anticoagulant long-term use  Deviated septum  Dry skin dermatitis ? - cannt tell if alesion from [facility]  try lachydrin for now and follow when possible  Skin area   Dry patch limited camera  Visual quality  No angry or redness  History of recurrent sinusitis and allergy with nosebleeds on Flonase on anticoagulation. Continue with the saline warm compresses can try Zyrtec instead of Claritin add antibiotic risk-benefit 50-50 as the right  side of her nasal passages totally clogged and now she has a headache on the right side of her head.  No systemic symptoms. She has had a history of bladder and pelvic floor pain that was improved with physical therapy that is since stopped because of the COVID-19 restrictions.  She has also had a history of recurrent UTI but this does not sound like a typical UTI.  Discussed antibiotic use risk-benefit she tolerates doxycycline we will add that back and continue her sinus hygiene as best possible. Follow-up as needed if not better.  Hopefully she can get back to physical therapy soon.  She is using small amounts of topical Premarin also.  Expectant management and discussion of plan and treatment with patient with opportunity to ask questions and all were answered. The patient agreed with the plan    The patient was advised to call back or seek an in-person evaluation if worsening having concerns    or if the condition fails to improve as anticipated.    Shanon Ace, MD

## 2018-08-02 ENCOUNTER — Other Ambulatory Visit: Payer: Self-pay | Admitting: Physician Assistant

## 2018-08-19 ENCOUNTER — Ambulatory Visit (INDEPENDENT_AMBULATORY_CARE_PROVIDER_SITE_OTHER): Payer: Medicare Other | Admitting: Internal Medicine

## 2018-08-19 ENCOUNTER — Other Ambulatory Visit: Payer: Self-pay

## 2018-08-19 ENCOUNTER — Encounter: Payer: Self-pay | Admitting: Internal Medicine

## 2018-08-19 DIAGNOSIS — S80862A Insect bite (nonvenomous), left lower leg, initial encounter: Secondary | ICD-10-CM | POA: Diagnosis not present

## 2018-08-19 DIAGNOSIS — W57XXXA Bitten or stung by nonvenomous insect and other nonvenomous arthropods, initial encounter: Secondary | ICD-10-CM | POA: Diagnosis not present

## 2018-08-19 NOTE — Progress Notes (Signed)
Virtual Visit via Video Note  I connected with@ on 08/19/18 at 11:30 AM EDT by a video enabled telemedicine application and verified that I am speaking with the correct person using two identifiers. Location patient: home Location provider:work  office Persons participating in the virtual visit: patient, provider  [place] national recommendations  regarding COVID 19 pandemic   video visit is advised over in office visit for this patient.  Patient aware  of the limitations of evaluation and management by telemedicine and  availability of in person appointments. and agreed to proceed.   HPI: Ashley Lawson presents for video visit  Yesterday noted bite immediate after in afternoon bed and began some itching  At some  Point she slapped a bug small but not  Sure what bit her at that time.   No fever other sx but now if larger red area and itchy  But not scratching it on her left lower leg .  ? What to do  Has pet dog getting groomed  No obv fleas   And no other bites or  Red spots  No tick bites or outside time  Recently   ROS: See pertinent positives and negatives per HPI.  Past Medical History:  Diagnosis Date  . Abdominal pain 12/2009   hospitalized  . Allergic rhinitis   . Chronic constipation   . Compression fx, lumbar spine ([place])   . DDD (degenerative disc disease), lumbar   . Diverticulosis of colon   . Dog bite(E906.0) 08/30/2011  . GERD (gastroesophageal reflux disease)   . Hepatic steatosis   . Hyperlipidemia   . Hyperplastic colon polyp   . IBS (irritable bowel syndrome)    constipation predominant  . Internal hemorrhoids   . Osteoporosis    dexa 2011 -2.7 hip nl spine  . PAF (paroxysmal atrial fibrillation) ([place])    a. dx 05/2017.  Marland Kitchen PMR (polymyalgia rheumatica) ([facility]) 08/30/2011   under rheum care  on low dose pred 5 mg   . Renal disorder     Past Surgical History:  Procedure Laterality Date  . ABDOMINAL HYSTERECTOMY    . CATARACT EXTRACTION  2010   both  .  COLONOSCOPY  2016 was most recent    Family History  Problem Relation Age of Onset  . Other Mother        fx hip  . Other Sister        fx hip  . Bladder Cancer Brother   . Colon cancer Neg Hx   . Esophageal cancer Neg Hx   . Stomach cancer Neg Hx   . Rectal cancer Neg Hx   . Liver cancer Neg Hx     Social History   Tobacco Use  . Smoking status: Never Smoker  . Smokeless tobacco: Never Used  Substance Use Topics  . Alcohol use: Yes    Alcohol/week: 0.0 standard drinks    Comment: occ  . Drug use: No      Current Outpatient Medications:  .  acetaminophen (TYLENOL) 500 MG tablet, Take 500 mg by mouth every 6 (six) hours as needed for mild pain, moderate pain or headache., Disp: , Rfl:  .  acetaminophen (TYLENOL) 650 MG CR tablet, Take 650 mg by mouth daily as needed for pain., Disp: , Rfl:  .  AMBULATORY NON FORMULARY MEDICATION, Medication Name: Diltiazem 2% with lidocaine 5% Insert a pea size amount into rectum to the first knuckle TID, Disp: 30 g, Rfl: 3 .  Ascorbic Acid (  VITAMIN C) 100 MG tablet, Take 100 mg by mouth daily.  , Disp: , Rfl:  .  calcium carbonate (OS-CAL) 1250 (500 Ca) MG chewable tablet, Chew 1 tablet by mouth 2 (two) times daily., Disp: , Rfl:  .  conjugated estrogens (PREMARIN) vaginal cream, Place 1 Applicatorful vaginally daily as needed (dryness)., Disp: , Rfl:  .  diltiazem (CARDIZEM CD) 120 MG 24 hr capsule, TAKE 1 CAPSULE BY MOUTH EVERY DAY, Disp: 90 capsule, Rfl: 0 .  doxycycline (VIBRAMYCIN) 100 MG capsule, Take 1 capsule (100 mg total) by mouth 2 (two) times daily. For sinusitis, Disp: 20 capsule, Rfl: 0 .  ELIQUIS 5 MG TABS tablet, TAKE 1 TABLET BY MOUTH TWICE A DAY, Disp: 180 tablet, Rfl: 1 .  fluticasone (FLONASE) 50 MCG/ACT nasal spray, Place 2 sprays into both nostrils daily., Disp: , Rfl:  .  guaiFENesin (MUCINEX) 600 MG 12 hr tablet, Take 600 mg by mouth 2 (two) times daily as needed (allergies)., Disp: , Rfl:  .  loratadine (CLARITIN)  10 MG tablet, Take 10 mg by mouth daily as needed for allergies., Disp: , Rfl:  .  magnesium hydroxide (MILK OF MAGNESIA) 400 MG/5ML suspension, Take 15 mLs by mouth daily as needed for mild constipation., Disp: , Rfl:  .  MULTIPLE VITAMIN PO, Take 1 tablet by mouth daily. , Disp: , Rfl:  .  polyethylene glycol (MIRALAX / GLYCOLAX) packet, Take 17 g by mouth daily., Disp: , Rfl:  .  Probiotic Product (ALIGN) 4 MG CAPS, Take 1 capsule by mouth daily., Disp: , Rfl:  .  RESTASIS 0.05 % ophthalmic emulsion, Place 1 drop into both eyes 2 (two) times daily., Disp: , Rfl: 3  EXAM: BP Readings from Last 3 Encounters:  05/15/18 122/70  05/08/18 (!) 100/58  04/28/18 122/64    VITALS per patient if applicable: temp 16.1   GENERAL: alert, oriented, appears well and in no acute distress  HEENT: atraumatic, conjunttiva clear, no obvious abnormalities on inspection of external nose and ears  NECK: normal movements of the head and neck  LUNGS: on inspection no signs of respiratory distress, breathing rate appears normal, no obvious gross SOB, gasping or wheezing  CV: no obvious cyanosis  LE noted a 50 cent sized  Red round spot on leg without vesicle or center coloration  No streaking  Or mass effect seen    PSYCH/NEURO: pleasant and cooperative, no obvious depression or anxiety, speech and thought processing grossly intact   ASSESSMENT AND PLAN:  Discussed the following assessment and plan:  Insect bite of left lower leg with local reaction, initial encounter Local reaction to bite  Suspected and no obv infection   Disc what to look for if fever  Pain and progressing let us know   Disc avoidance ( she is having orkin come and check over house.   Cool compresses  1% HCS otc bid and follow   Expect to remain for a number of days   Counseled.   Expectant management and discussion of plan and treatment with opportunity to ask questions and all were answered. The patient agreed with the plan and  demonstrated an understanding of the instructions.   Advised to call back or seek an in-person evaluation if worsening  or having  further concerns .    Shanon Ace, MD

## 2018-09-11 DIAGNOSIS — H04123 Dry eye syndrome of bilateral lacrimal glands: Secondary | ICD-10-CM | POA: Diagnosis not present

## 2018-09-11 DIAGNOSIS — Z961 Presence of intraocular lens: Secondary | ICD-10-CM | POA: Diagnosis not present

## 2018-09-17 ENCOUNTER — Emergency Department (HOSPITAL_COMMUNITY): Payer: Medicare Other

## 2018-09-17 ENCOUNTER — Observation Stay (HOSPITAL_COMMUNITY)
Admission: EM | Admit: 2018-09-17 | Discharge: 2018-09-18 | Disposition: A | Discharge status: Home / Self Care | Payer: Medicare Other | Attending: Internal Medicine | Admitting: Internal Medicine

## 2018-09-17 ENCOUNTER — Ambulatory Visit: Payer: Self-pay | Admitting: *Deleted

## 2018-09-17 ENCOUNTER — Other Ambulatory Visit: Payer: Self-pay

## 2018-09-17 DIAGNOSIS — R42 Dizziness and giddiness: Secondary | ICD-10-CM

## 2018-09-17 DIAGNOSIS — K5909 Other constipation: Secondary | ICD-10-CM | POA: Diagnosis not present

## 2018-09-17 DIAGNOSIS — E785 Hyperlipidemia, unspecified: Secondary | ICD-10-CM | POA: Insufficient documentation

## 2018-09-17 DIAGNOSIS — H8110 Benign paroxysmal vertigo, unspecified ear: Secondary | ICD-10-CM | POA: Diagnosis not present

## 2018-09-17 DIAGNOSIS — R270 Ataxia, unspecified: Secondary | ICD-10-CM | POA: Diagnosis not present

## 2018-09-17 DIAGNOSIS — N200 Calculus of kidney: Secondary | ICD-10-CM | POA: Diagnosis not present

## 2018-09-17 DIAGNOSIS — R350 Frequency of micturition: Secondary | ICD-10-CM | POA: Diagnosis not present

## 2018-09-17 DIAGNOSIS — I6782 Cerebral ischemia: Secondary | ICD-10-CM | POA: Diagnosis not present

## 2018-09-17 DIAGNOSIS — Z1159 Encounter for screening for other viral diseases: Secondary | ICD-10-CM | POA: Insufficient documentation

## 2018-09-17 DIAGNOSIS — M5136 Other intervertebral disc degeneration, lumbar region: Secondary | ICD-10-CM | POA: Insufficient documentation

## 2018-09-17 DIAGNOSIS — M810 Age-related osteoporosis without current pathological fracture: Secondary | ICD-10-CM | POA: Insufficient documentation

## 2018-09-17 DIAGNOSIS — K429 Umbilical hernia without obstruction or gangrene: Secondary | ICD-10-CM | POA: Insufficient documentation

## 2018-09-17 DIAGNOSIS — R930 Abnormal findings on diagnostic imaging of skull and head, not elsewhere classified: Secondary | ICD-10-CM | POA: Diagnosis not present

## 2018-09-17 DIAGNOSIS — K5730 Diverticulosis of large intestine without perforation or abscess without bleeding: Secondary | ICD-10-CM | POA: Insufficient documentation

## 2018-09-17 DIAGNOSIS — Z79899 Other long term (current) drug therapy: Secondary | ICD-10-CM | POA: Diagnosis not present

## 2018-09-17 DIAGNOSIS — M353 Polymyalgia rheumatica: Secondary | ICD-10-CM | POA: Diagnosis not present

## 2018-09-17 DIAGNOSIS — G319 Degenerative disease of nervous system, unspecified: Secondary | ICD-10-CM | POA: Diagnosis not present

## 2018-09-17 DIAGNOSIS — Z7951 Long term (current) use of inhaled steroids: Secondary | ICD-10-CM | POA: Diagnosis not present

## 2018-09-17 DIAGNOSIS — I700 Atherosclerosis of aorta: Secondary | ICD-10-CM | POA: Insufficient documentation

## 2018-09-17 DIAGNOSIS — J329 Chronic sinusitis, unspecified: Secondary | ICD-10-CM | POA: Diagnosis not present

## 2018-09-17 DIAGNOSIS — R109 Unspecified abdominal pain: Secondary | ICD-10-CM

## 2018-09-17 DIAGNOSIS — R262 Difficulty in walking, not elsewhere classified: Secondary | ICD-10-CM | POA: Diagnosis not present

## 2018-09-17 DIAGNOSIS — K589 Irritable bowel syndrome without diarrhea: Secondary | ICD-10-CM | POA: Diagnosis not present

## 2018-09-17 DIAGNOSIS — Z7901 Long term (current) use of anticoagulants: Secondary | ICD-10-CM | POA: Diagnosis not present

## 2018-09-17 DIAGNOSIS — K219 Gastro-esophageal reflux disease without esophagitis: Secondary | ICD-10-CM | POA: Insufficient documentation

## 2018-09-17 DIAGNOSIS — I480 Paroxysmal atrial fibrillation: Secondary | ICD-10-CM

## 2018-09-17 DIAGNOSIS — R197 Diarrhea, unspecified: Secondary | ICD-10-CM | POA: Diagnosis not present

## 2018-09-17 DIAGNOSIS — R269 Unspecified abnormalities of gait and mobility: Secondary | ICD-10-CM | POA: Diagnosis not present

## 2018-09-17 LAB — CBC
HCT: 42.5 % (ref 36.0–46.0)
Hemoglobin: 13.9 g/dL (ref 12.0–15.0)
MCH: 31.4 pg (ref 26.0–34.0)
MCHC: 32.7 g/dL (ref 30.0–36.0)
MCV: 96.2 fL (ref 80.0–100.0)
Platelets: 247 10*3/uL (ref 150–400)
RBC: 4.42 MIL/uL (ref 3.87–5.11)
RDW: 12.3 % (ref 11.5–15.5)
WBC: 8 10*3/uL (ref 4.0–10.5)
nRBC: 0 % (ref 0.0–0.2)

## 2018-09-17 LAB — URINALYSIS, ROUTINE W REFLEX MICROSCOPIC
Bilirubin Urine: NEGATIVE
Glucose, UA: NEGATIVE mg/dL
Hgb urine dipstick: NEGATIVE
Ketones, ur: NEGATIVE mg/dL
Leukocytes,Ua: NEGATIVE
Nitrite: NEGATIVE
Protein, ur: 30 mg/dL — AB
Specific Gravity, Urine: 1.027 (ref 1.005–1.030)
pH: 5 (ref 5.0–8.0)

## 2018-09-17 LAB — BASIC METABOLIC PANEL
Anion gap: 11 (ref 5–15)
BUN: 9 mg/dL (ref 8–23)
CO2: 23 mmol/L (ref 22–32)
Calcium: 9.6 mg/dL (ref 8.9–10.3)
Chloride: 100 mmol/L (ref 98–111)
Creatinine, Ser: 1.05 mg/dL — ABNORMAL HIGH (ref 0.44–1.00)
GFR calc Af Amer: 56 mL/min — ABNORMAL LOW (ref >60)
GFR calc non Af Amer: 49 mL/min — ABNORMAL LOW (ref >60)
Glucose, Bld: 150 mg/dL — ABNORMAL HIGH (ref 70–99)
Potassium: 4.1 mmol/L (ref 3.5–5.1)
Sodium: 134 mmol/L — ABNORMAL LOW (ref 135–145)

## 2018-09-17 LAB — CBG MONITORING, ED: Glucose-Capillary: 133 mg/dL — ABNORMAL HIGH (ref 70–99)

## 2018-09-17 MED ORDER — ACETAMINOPHEN 325 MG PO TABS: 650.0000 mg | ORAL_TABLET | Freq: Four times a day (QID) | ORAL | Status: DC | PRN

## 2018-09-17 MED ORDER — ONDANSETRON HCL 4 MG/2ML IJ SOLN
4.0000 mg | Freq: Once | INTRAMUSCULAR | Status: DC
  Filled 2018-09-17: qty 2

## 2018-09-17 MED ORDER — SODIUM CHLORIDE 0.9% FLUSH
3.0000 mL | Freq: Once | INTRAVENOUS | Status: AC
  Administered 2018-09-17: 3 mL via INTRAVENOUS

## 2018-09-17 MED ORDER — MECLIZINE HCL 25 MG PO TABS
12.5000 mg | ORAL_TABLET | Freq: Three times a day (TID) | ORAL | Status: DC | PRN
  Administered 2018-09-18: 12.5 mg via ORAL
  Filled 2018-09-17: qty 1

## 2018-09-17 MED ORDER — DILTIAZEM HCL ER COATED BEADS 120 MG PO CP24
120.0000 mg | ORAL_CAPSULE | Freq: Every day | ORAL | Status: DC
  Administered 2018-09-18: 120 mg via ORAL
  Filled 2018-09-17 (×2): qty 1

## 2018-09-17 MED ORDER — IOHEXOL 300 MG/ML  SOLN
100.0000 mL | Freq: Once | INTRAMUSCULAR | Status: AC | PRN
  Administered 2018-09-17: 100 mL via INTRAVENOUS

## 2018-09-17 MED ORDER — SODIUM CHLORIDE 0.9 % IV BOLUS
500.0000 mL | Freq: Once | INTRAVENOUS | Status: AC
  Administered 2018-09-17: 500 mL via INTRAVENOUS

## 2018-09-17 MED ORDER — MECLIZINE HCL 25 MG PO TABS
12.5000 mg | ORAL_TABLET | Freq: Once | ORAL | Status: AC
  Administered 2018-09-17: 12.5 mg via ORAL
  Filled 2018-09-17: qty 1

## 2018-09-17 MED ORDER — ACETAMINOPHEN 650 MG RE SUPP: 650.0000 mg | Freq: Four times a day (QID) | RECTAL | Status: DC | PRN

## 2018-09-17 MED ORDER — APIXABAN 5 MG PO TABS
5.0000 mg | ORAL_TABLET | Freq: Two times a day (BID) | ORAL | Status: DC
  Administered 2018-09-18: 5 mg via ORAL
  Filled 2018-09-17 (×2): qty 1

## 2018-09-17 MED ORDER — LORAZEPAM 2 MG/ML IJ SOLN
1.0000 mg | Freq: Once | INTRAMUSCULAR | Status: AC
  Administered 2018-09-17: 1 mg via INTRAVENOUS
  Filled 2018-09-17: qty 1

## 2018-09-17 MED ORDER — LORAZEPAM 2 MG/ML IJ SOLN
1.0000 mg | Freq: Once | INTRAMUSCULAR | Status: AC
  Administered 2018-09-17: 21:00:00 1 mg via INTRAVENOUS
  Filled 2018-09-17: qty 1

## 2018-09-17 MED ORDER — ONDANSETRON HCL 4 MG/2ML IJ SOLN
4.0000 mg | Freq: Once | INTRAMUSCULAR | Status: AC
  Administered 2018-09-17: 4 mg via INTRAVENOUS

## 2018-09-17 NOTE — ED Triage Notes (Signed)
Pt arrives with daughter with c/o of dizziness that started last night while she was having some diarrhea. Pt states she was up most of the night having loose stools. Pt is alert and 0x4.

## 2018-09-17 NOTE — Telephone Encounter (Signed)
Patient is calling with new onset dizziness/vertigo type symptoms. Patient has never had this before. Patient states she was up all night with frequency and she started nausea and vomiting this morning. She has generalized weakness- she feels from lack of sleep- she can not go to sleep.  Call to office- discuss symptoms and they are in agreement- patient needs ED.   Reason for Disposition . [1] Dizziness (vertigo) present now AND [2] age > 64  (Exception: prior physician evaluation for this AND no different/worse than usual)  Answer Assessment - Initial Assessment Questions 1. DESCRIPTION: "Describe your dizziness."     Dizzy and can't sleep 2. LIGHTHEADED: "Do you feel lightheaded?" (e.g., somewhat faint, woozy, weak upon standing)     Weak with standing 3. VERTIGO: "Do you feel like either you or the room is spinning or tilting?" (i.e. vertigo)     Bed spins and rooms spins 4. SEVERITY: "How bad is it?"  "Do you feel like you are going to faint?" "Can you stand and walk?"   - MILD - walking normally   - MODERATE - interferes with normal activities (e.g., work, school)    - SEVERE - unable to stand, requires support to walk, feels like passing out now.      moderate 5. ONSET:  "When did the dizziness begin?"     Dizziness started today 6. AGGRAVATING FACTORS: "Does anything make it worse?" (e.g., standing, change in head position)     Standing, turning to the left in the bed 7. HEART RATE: "Can you tell me your heart rate?" "How many beats in 15 seconds?"  (Note: not all patients can do this)       n/a 8. CAUSE: "What do you think is causing the dizziness?"     stress 9. RECURRENT SYMPTOM: "Have you had dizziness before?" If so, ask: "When was the last time?" "What happened that time?"     no 10. OTHER SYMPTOMS: "Do you have any other symptoms?" (e.g., fever, chest pain, vomiting, diarrhea, bleeding)       Vomiting,patient took Miralax-loose stool 11. PREGNANCY: "Is there any chance  you are pregnant?" "When was your last menstrual period?"       n/a  Protocols used: DIZZINESS - VERTIGO-A-AH, [place]

## 2018-09-17 NOTE — ED Provider Notes (Signed)
[facility] EMERGENCY DEPARTMENT Provider Note   CSN: [id number] Arrival date & time: 09/17/18  1620     History   Chief Complaint Chief Complaint  Patient presents with  . Dizziness    HPI [name] is a 85 y.o. female.  Ashley Lawson is complaining of feeling fatigued after being last of the night having urinary frequency.  Ashley Lawson said Ashley Lawson had a little bit of diarrhea this morning but Ashley Lawson attributes that to taking her laxative.  Ashley Lawson is also feeling dizzy which seems to be mostly feeling unbalanced and a spinning sensation.  Ashley Lawson said it was mostly being upright although Ashley Lawson has noticed it somewhat in bed if Ashley Lawson turns her head to a specific position.  Ashley Lawson is never had this before.  No headache double vision blurry vision difficulty speaking.  No focal numbness or weakness.  Ashley Lawson has noticed some balance difficulties since last night.     The history is provided by the patient.  Dizziness Quality:  Head spinning and imbalance Severity:  Moderate Onset quality:  Gradual Timing:  Intermittent Progression:  Unchanged Chronicity:  New Context: head movement and standing up   Relieved by:  None tried Worsened by:  Standing up and turning head Ineffective treatments:  None tried Associated symptoms: diarrhea   Associated symptoms: no chest pain, no headaches, no nausea, no shortness of breath, no syncope, no tinnitus, no vision changes and no vomiting   Risk factors: no hx of vertigo and no Meniere's disease     Past Medical History:  Diagnosis Date  . Abdominal pain 12/2009   hospitalized  . Allergic rhinitis   . Chronic constipation   . Compression fx, lumbar spine ([place])   . DDD (degenerative disc disease), lumbar   . Diverticulosis of colon   . Dog bite(E906.0) 08/30/2011  . GERD (gastroesophageal reflux disease)   . Hepatic steatosis   . Hyperlipidemia   . Hyperplastic colon polyp   . IBS (irritable bowel syndrome)    constipation predominant  . Internal  hemorrhoids   . Osteoporosis    dexa 2011 -2.7 hip nl spine  . PAF (paroxysmal atrial fibrillation) ([place])    a. dx 05/2017.  Marland Kitchen PMR (polymyalgia rheumatica) ([facility]) 08/30/2011   under rheum care  on low dose pred 5 mg   . Renal disorder     Patient Active Problem List   Diagnosis Date Noted  . Chest discomfort 05/13/2018  . Coagulation defect ([place]) 01/30/2018  . Deviated septum 01/30/2018  . Epistaxis, recurrent 01/30/2018  . Atrial fibrillation with RVR ([place]) 05/02/2017  . Rhinitis, allergic 05/31/2015  . Renal cyst incidental  01/19/2015  . Heartburn 01/14/2014  . Gastroesophageal reflux disease without esophagitis 01/14/2014  . Osteoporosis 01/14/2014  . IBS (irritable bowel syndrome)   . Nail, ingrown 06/22/2012  . Arthritis 02/19/2012  . Screening, lipid 02/18/2011  . Rash and nonspecific skin eruption 02/18/2011  . Visit for preventive health examination 02/18/2011  . Skin cancer 10/14/2010  . Abdominal bloating 10/08/2010  . CONSTIPATION, CHRONIC 01/15/2010  . Lower urinary tract symptoms (LUTS) 01/15/2010  . SUPRAPUBIC PAIN 08/01/2009  . ABDOMINAL PAIN, EPIGASTRIC 06/16/2009  . DIVERTICULOSIS, COLON 06/07/2007  . Hyperlipidemia 06/05/2007  . ANXIETY STATE, UNSPECIFIED 06/05/2007  . Allergic rhinitis 06/05/2007  . IBS 06/05/2007  . OSTEOPOROSIS 06/05/2007  . ABDOMINAL PAIN, GENERALIZED 06/05/2007    Past Surgical History:  Procedure Laterality Date  . ABDOMINAL HYSTERECTOMY    . CATARACT EXTRACTION  2010  both  . COLONOSCOPY  2016 was most recent     OB History   No obstetric history on file.      Home Medications    Prior to Admission medications   Medication Sig Start Date End Date Taking? Authorizing Provider  acetaminophen (TYLENOL) 500 MG tablet Take 500 mg by mouth every 6 (six) hours as needed for mild pain, moderate pain or headache.    [provider]  acetaminophen (TYLENOL) 650 MG CR tablet Take 650 mg by mouth daily as needed for  pain.    [provider]  AMBULATORY NON FORMULARY MEDICATION Medication Name: Diltiazem 2% with lidocaine 5% Insert a pea size amount into rectum to the first knuckle TID 06/12/17   Gatha Mayer, MD  Ascorbic Acid (VITAMIN C) 100 MG tablet Take 100 mg by mouth daily.      [provider]  calcium carbonate (OS-CAL) 1250 (500 Ca) MG chewable tablet Chew 1 tablet by mouth 2 (two) times daily.    [provider]  conjugated estrogens (PREMARIN) vaginal cream Place 1 Applicatorful vaginally daily as needed (dryness).    [provider]  diltiazem (CARDIZEM CD) 120 MG 24 hr capsule TAKE 1 CAPSULE BY MOUTH EVERY DAY 08/03/18   Dunn, Nedra Hai, PA-C  doxycycline (VIBRAMYCIN) 100 MG capsule Take 1 capsule (100 mg total) by mouth 2 (two) times daily. For sinusitis 07/13/18   Panosh, Standley Brooking, MD  ELIQUIS 5 MG TABS tablet TAKE 1 TABLET BY MOUTH TWICE A DAY 05/18/18   Burnell Blanks, MD  fluticasone (FLONASE) 50 MCG/ACT nasal spray Place 2 sprays into both nostrils daily.    [provider]  guaiFENesin (MUCINEX) 600 MG 12 hr tablet Take 600 mg by mouth 2 (two) times daily as needed (allergies).    [provider]  loratadine (CLARITIN) 10 MG tablet Take 10 mg by mouth daily as needed for allergies.    [provider]  magnesium hydroxide (MILK OF MAGNESIA) 400 MG/5ML suspension Take 15 mLs by mouth daily as needed for mild constipation.    [provider]  MULTIPLE VITAMIN PO Take 1 tablet by mouth daily.     [provider]  polyethylene glycol (MIRALAX / GLYCOLAX) packet Take 17 g by mouth daily.    [provider]  Probiotic Product (ALIGN) 4 MG CAPS Take 1 capsule by mouth daily.    [provider]  RESTASIS 0.05 % ophthalmic emulsion Place 1 drop into both eyes 2 (two) times daily. 10/26/14   [provider]    Family History Family History  Problem Relation Age of Onset  . Other Mother         fx hip  . Other Sister        fx hip  . Bladder Cancer Brother   . Colon cancer Neg Hx   . Esophageal cancer Neg Hx   . Stomach cancer Neg Hx   . Rectal cancer Neg Hx   . Liver cancer Neg Hx     Social History Social History   Tobacco Use  . Smoking status: Never Smoker  . Smokeless tobacco: Never Used  Substance Use Topics  . Alcohol use: Yes    Alcohol/week: 0.0 standard drinks    Comment: occ  . Drug use: No     Allergies   Alendronate sodium, Penicillins, Amoxicillin, and Metronidazole   Review of Systems Review of Systems  Constitutional: Negative for fever.  HENT: Negative  for sore throat and tinnitus.   Eyes: Negative for visual disturbance.  Respiratory: Negative for shortness of breath.   Cardiovascular: Negative for chest pain and syncope.  Gastrointestinal: Positive for abdominal pain and diarrhea. Negative for nausea and vomiting.  Genitourinary: Positive for frequency. Negative for dysuria.  Musculoskeletal: Negative for neck pain.  Skin: Negative for rash.  Neurological: Positive for dizziness. Negative for headaches.     Physical Exam Updated Vital Signs BP 136/65 (BP Location: Right Arm)   Pulse 92   Temp 98 F (36.7 C) (Oral)   Resp 16   SpO2 99%   Physical Exam Vitals signs and nursing note reviewed.  Constitutional:      General: Ashley Lawson is not in acute distress.    Appearance: Ashley Lawson is well-developed.  HENT:     Head: Normocephalic and atraumatic.  Eyes:     Conjunctiva/sclera: Conjunctivae normal.  Neck:     Musculoskeletal: Neck supple.  Cardiovascular:     Rate and Rhythm: Normal rate and regular rhythm.     Heart sounds: No murmur.  Pulmonary:     Effort: Pulmonary effort is normal. No respiratory distress.     Breath sounds: Normal breath sounds.  Abdominal:     Palpations: Abdomen is soft.     Tenderness: There is abdominal tenderness (llq). There is no guarding or rebound.  Musculoskeletal: Normal range of motion.      Right lower leg: No edema.     Left lower leg: No edema.  Skin:    General: Skin is warm and dry.     Capillary Refill: Capillary refill takes less than 2 seconds.  Neurological:     General: No focal deficit present.     Mental Status: Ashley Lawson is alert and oriented to person, place, and time.     Cranial Nerves: No cranial nerve deficit.     Sensory: No sensory deficit.     Motor: No weakness.      ED Treatments / Results  Labs (all labs ordered are listed, but only abnormal results are displayed) Labs Reviewed  BASIC METABOLIC PANEL - Abnormal; Notable for the following components:      Result Value   Sodium 134 (*)    Glucose, Bld 150 (*)    Creatinine, Ser 1.05 (*)    GFR calc non Af Amer 49 (*)    GFR calc Af Amer 56 (*)    All other components within normal limits  URINALYSIS, ROUTINE W REFLEX MICROSCOPIC - Abnormal; Notable for the following components:   Color, Urine AMBER (*)    APPearance HAZY (*)    Protein, ur 30 (*)    Bacteria, UA RARE (*)    All other components within normal limits  CBG MONITORING, ED - Abnormal; Notable for the following components:   Glucose-Capillary 133 (*)    All other components within normal limits  URINE CULTURE  NOVEL CORONAVIRUS, NAA (HOSPITAL ORDER, SEND-OUT TO REF LAB)  CBC    EKG EKG Interpretation  Date/Time:  Thursday September 17 2018 16:52:23 EDT Ventricular Rate:  89 PR Interval:    QRS Duration: 84 QT Interval:  365 QTC Calculation: 445 R Axis:   2 Text Interpretation:  Sinus rhythm similar to prior 2/19 Confirmed by Aletta Edouard [phone]) on 09/17/2018 5:07:51 PM Also confirmed by Aletta Edouard [phone]), editor Hattie Perch ([id number])  on 09/18/2018 9:09:32 AM   Radiology Ct Head Wo Contrast  Result Date: 09/17/2018 CLINICAL DATA:  85 year old with ataxia.  Dizziness. EXAM: CT HEAD WITHOUT CONTRAST TECHNIQUE: Contiguous axial images were obtained from the base of the skull through the vertex without intravenous  contrast. COMPARISON:  Head CT 05/01/2017 FINDINGS: Brain: No intracranial hemorrhage, mass effect, or midline shift. Stable degree of atrophy and chronic small vessel ischemia. Remote lacunar infarct versus prominent perivascular space in the left insula. No hydrocephalus. The basilar cisterns are patent. No evidence of territorial infarct or acute ischemia. No extra-axial or intracranial fluid collection. Vascular: Atherosclerosis of skullbase vasculature without hyperdense vessel or abnormal calcification. Skull: No fracture or focal lesion. Sinuses/Orbits: Paranasal sinuses and mastoid air cells are clear. The visualized orbits are unremarkable. Bilateral cataract resection. Other: None. IMPRESSION: 1. No acute intracranial abnormality. 2. Stable atrophy and chronic small vessel ischemia. Electronically Signed   By: Keith Rake M.D.   On: 09/17/2018 19:01   Mr Brain Wo Contrast  Result Date: 09/17/2018 CLINICAL DATA:  85 y/o  F; Vertigo, episodic, peripheral. EXAM: MRI HEAD WITHOUT CONTRAST TECHNIQUE: Multiplanar, multiecho pulse sequences of the brain and surrounding structures were obtained without intravenous contrast. COMPARISON:  09/17/2018 CT head FINDINGS: Brain: No acute infarction, hemorrhage, hydrocephalus, extra-axial collection or mass lesion. Early confluent nonspecific T2 FLAIR hyperintensities in subcortical and periventricular white matter are compatible with moderate chronic microvascular ischemic changes. Moderate volume loss of the brain. Vascular: Normal flow voids. Skull and upper cervical spine: Normal marrow signal. Sinuses/Orbits: Negative. Other: Bilateral intra-ocular lens replacement. IMPRESSION: 1. No acute intracranial abnormality identified. 2. Moderate chronic microvascular ischemic changes and volume loss of the brain. Electronically Signed   By: Kristine Garbe M.D.   On: 09/17/2018 21:57   Ct Abdomen Pelvis W Contrast  Result Date: 09/17/2018 CLINICAL DATA:   Abdominal pain and diarrhea. Dizziness. EXAM: CT ABDOMEN AND PELVIS WITH CONTRAST TECHNIQUE: Multidetector CT imaging of the abdomen and pelvis was performed using the standard protocol following bolus administration of intravenous contrast. CONTRAST:  139m OMNIPAQUE IOHEXOL 300 MG/ML  SOLN COMPARISON:  Noncontrast CT 04/08/2018 FINDINGS: Lower chest: Lung bases are clear. No pleural fluid or consolidation. Minimal subpleural nodularity in the right lower lobe is unchanged from prior exam likely scarring. Hepatobiliary: Calcified granuloma. No focal lesion. Gallbladder physiologically distended, no calcified stone. No biliary dilatation. Pancreas: No ductal dilatation or inflammation. Spleen: Normal in size without focal abnormality. Adrenals/Urinary Tract: Normal adrenal glands. No hydronephrosis or perinephric edema. Homogeneous renal enhancement with symmetric excretion on delayed phase imaging. Subcentimeter low-density lesions in the both kidneys are too small to accurately characterize but likely small cysts. Possible punctate nonobstructing lower left renal stone. Urinary bladder is physiologically distended without wall thickening. Stomach/Bowel: Colonic diverticulosis without diverticulitis. No colonic wall thickening or inflammation. No liquid stool in the colon. No small bowel obstruction, inflammation, or evident wall thickening. Appendix not confidently visualized, no pericecal inflammation to suggest appendicitis. Stomach is decompressed. Vascular/Lymphatic: Aortic atherosclerosis without aneurysm. Portal vein and mesenteric vessels are patent. No adenopathy. Reproductive: Post hysterectomy. Ovaries tentatively identified and quiescent. No adnexal mass. Other: No free air, free fluid, or intra-abdominal fluid collection. Tiny fat containing umbilical hernia. Musculoskeletal: Degenerative change in the spine with vacuum phenomena. There are no acute or suspicious osseous abnormalities. IMPRESSION: 1.  No acute abnormality or explanation for abdominal pain. 2. Colonic diverticulosis without diverticulitis. No bowel or colonic inflammation. 3. Punctate nonobstructing left renal stone. 4.  Aortic Atherosclerosis (ICD10-I70.0). Electronically Signed   By: MKeith RakeM.D.   On: 09/17/2018 19:08    Procedures Procedures (including critical care  time)  Medications Ordered in ED Medications  sodium chloride flush (NS) 0.9 % injection 3 mL (has no administration in time range)  sodium chloride 0.9 % bolus 500 mL (has no administration in time range)     Initial Impression / Assessment and Plan / ED Course  I have reviewed the triage vital signs and the nursing notes.  Pertinent labs & imaging results that were available during my care of the patient were reviewed by me and considered in my medical decision making (see chart for details).  Clinical Course as of Sep 17 1041  Thu Sep 16, 8060  [id number] 85 year old female here with urinary frequency and feeling dizzy which seems more vertiginous room spinning although there is a component of lightheadedness to it.  Ashley Lawson is otherwise normal neuro exam.  Ashley Lawson has some left lower quadrant tenderness to which sounds like Ashley Lawson might of had before but Ashley Lawson says is worse than normal.  Will check some labs urinalysis head CT and abdominal CT.   [MB]  1706 Differential diagnosis includes vertigo, dehydration, stroke, diverticulitis, UTI   [MB]  1819 I reviewed the patient's CT head and CT abdomen and pelvis along with her EKG.  Awaiting radiology input.   [MB]  0355 Patient being signed out to my partner.  Plan is to follow-up on CT head and CT abdomen pelvis.  If no acute findings can check orthostatics and if feeling improved possibly could be discharged.  If is still having significant unsteadiness consider MRI.   [MB]    Clinical Course User Index [MB] Hayden Rasmussen, MD   Arina Torry Mangus was evaluated in Emergency Department on 09/17/2018 for the  symptoms described in the history of present illness. Ashley Lawson was evaluated in the context of the global COVID-19 pandemic, which necessitated consideration that the patient might be at risk for infection with the SARS-CoV-2 virus that causes COVID-19. Institutional protocols and algorithms that pertain to the evaluation of patients at risk for COVID-19 are in a state of rapid change based on information released by regulatory bodies including the CDC and federal and state organizations. These policies and algorithms were followed during the patient's care in the ED.       Final Clinical Impressions(s) / ED Diagnoses   Final diagnoses:  Dizziness  Urinary frequency  Vertigo  Ambulatory dysfunction    ED Discharge Orders    None       Hayden Rasmussen, MD 09/18/18 1043

## 2018-09-17 NOTE — ED Notes (Signed)
Patient transported to CT 

## 2018-09-17 NOTE — ED Notes (Addendum)
Pt. Was amb. In hall from rm. 40 to 41 x1 w/ assistance and a walker. Pt. Complained of dizziness and that she was very week. Pt.did not tolerate well when returning to room.

## 2018-09-17 NOTE — Telephone Encounter (Signed)
Will monitor for ED arrival.  

## 2018-09-17 NOTE — H&P (Signed)
History and Physical    Ashley Lawson DXA:[id number] DOB: May 18, 1932 DOA: 09/17/2018  PCP: Burnis Medin, MD Patient coming from: Home  Chief Complaint: Dizziness  HPI: Ashley Lawson is a 85 y.o. female with medical history significant of chronic constipation, lumbar degenerative disc disease and compression fracture, diverticulosis, GERD, hepatic steatosis, hyperlipidemia, IBS, internal hemorrhoids, osteoporosis, paroxysmal atrial fibrillation, polymyalgia rheumatica presenting to the hospital for evaluation of dizziness.  Patient is a poor historian and limited history could be obtained.  States that she had 3-4 episodes of dizziness today.  She had a similar problem a few months ago but did not seek medical attention at that time.  Dizziness is not associated with nausea or headaches.  No room spinning sensation.  She is not sure if it is associated with head movement.  Denies any fevers, chills, chest pain, shortness of breath, cough, abdominal pain, nausea, vomiting, diarrhea, or constipation.  No other complaints.  ED Course: Afebrile and hemodynamically stable.  Orthostatics negative.  No leukocytosis.  UA not suggestive of infection.  Urine culture pending.  COVID-19 rapid test pending.  CT abdomen pelvis showing punctate nonobstructing left renal stone and no other acute abnormality/ explanation for abdominal pain.  CT head negative for acute finding.  Brain MRI negative for acute finding.  Patient received Ativan, meclizine, and 500 cc fluid bolus in the ED.  Review of Systems:  All systems reviewed and apart from history of presenting illness, are negative.  Past Medical History:  Diagnosis Date  . Abdominal pain 12/2009   hospitalized  . Allergic rhinitis   . Chronic constipation   . Compression fx, lumbar spine ([place])   . DDD (degenerative disc disease), lumbar   . Diverticulosis of colon   . Dog bite(E906.0) 08/30/2011  . GERD (gastroesophageal reflux disease)   . Hepatic  steatosis   . Hyperlipidemia   . Hyperplastic colon polyp   . IBS (irritable bowel syndrome)    constipation predominant  . Internal hemorrhoids   . Osteoporosis    dexa 2011 -2.7 hip nl spine  . PAF (paroxysmal atrial fibrillation) ([place])    a. dx 05/2017.  Marland Kitchen PMR (polymyalgia rheumatica) ([facility]) 08/30/2011   under rheum care  on low dose pred 5 mg   . Renal disorder     Past Surgical History:  Procedure Laterality Date  . ABDOMINAL HYSTERECTOMY    . CATARACT EXTRACTION  2010   both  . COLONOSCOPY  2016 was most recent     reports that she has never smoked. She has never used smokeless tobacco. She reports current alcohol use. She reports that she does not use drugs.  Allergies  Allergen Reactions  . Alendronate Sodium     upset stomach  . Penicillins     Has patient had a PCN reaction causing immediate rash, facial/tongue/throat swelling, SOB or lightheadedness with hypotension: Yes Has patient had a PCN reaction causing severe rash involving mucus membranes or skin necrosis: No Has patient had a PCN reaction that required hospitalization: No Has patient had a PCN reaction occurring within the last 10 years: No If all of the above answers are "NO", then may proceed with Cephalosporin use.   Marland Kitchen Amoxicillin Rash    Has patient had a PCN reaction causing immediate rash, facial/tongue/throat swelling, SOB or lightheadedness with hypotension: yes Has patient had a PCN reaction causing severe rash involving mucus membranes or skin necrosis: no Has patient had a PCN reaction that required hospitalization: no  Has patient had PCN reaction within the last 10 years: no  If all of the above answers are "NO", then may proceed with Cephalosporin use.  REACTION: unspecified  . Metronidazole Other (See Comments)    Pounding headaches patient says was bad.  With cipro for diverticulitis rx. Other reaction(s): Other (See Comments) Pounding headaches patient says was bad.With cipro for  diverticulitis rx.    Family History  Problem Relation Age of Onset  . Other Mother        fx hip  . Other Sister        fx hip  . Bladder Cancer Brother   . Colon cancer Neg Hx   . Esophageal cancer Neg Hx   . Stomach cancer Neg Hx   . Rectal cancer Neg Hx   . Liver cancer Neg Hx     Prior to Admission medications   Medication Sig Start Date End Date Taking? Authorizing Provider  acetaminophen (TYLENOL) 500 MG tablet Take 500 mg by mouth every 6 (six) hours as needed for mild pain, moderate pain or headache.    [provider]  acetaminophen (TYLENOL) 650 MG CR tablet Take 650 mg by mouth daily as needed for pain.    [provider]  AMBULATORY NON FORMULARY MEDICATION Medication Name: Diltiazem 2% with lidocaine 5% Insert a pea size amount into rectum to the first knuckle TID 06/12/17   Gatha Mayer, MD  Ascorbic Acid (VITAMIN C) 100 MG tablet Take 100 mg by mouth daily.      [provider]  calcium carbonate (OS-CAL) 1250 (500 Ca) MG chewable tablet Chew 1 tablet by mouth 2 (two) times daily.    [provider]  conjugated estrogens (PREMARIN) vaginal cream Place 1 Applicatorful vaginally daily as needed (dryness).    [provider]  diltiazem (CARDIZEM CD) 120 MG 24 hr capsule TAKE 1 CAPSULE BY MOUTH EVERY DAY 08/03/18   Dunn, Nedra Hai, PA-C  doxycycline (VIBRAMYCIN) 100 MG capsule Take 1 capsule (100 mg total) by mouth 2 (two) times daily. For sinusitis 07/13/18   Panosh, Standley Brooking, MD  ELIQUIS 5 MG TABS tablet TAKE 1 TABLET BY MOUTH TWICE A DAY 05/18/18   Burnell Blanks, MD  fluticasone (FLONASE) 50 MCG/ACT nasal spray Place 2 sprays into both nostrils daily.    [provider]  guaiFENesin (MUCINEX) 600 MG 12 hr tablet Take 600 mg by mouth 2 (two) times daily as needed (allergies).    [provider]  loratadine (CLARITIN) 10 MG tablet Take 10 mg by mouth daily as needed for allergies.    [provider]  magnesium hydroxide (MILK OF MAGNESIA) 400 MG/5ML suspension Take 15 mLs by mouth daily as needed for mild constipation.    [provider]  MULTIPLE VITAMIN PO Take 1 tablet by mouth daily.     [provider]  polyethylene glycol (MIRALAX / GLYCOLAX) packet Take 17 g by mouth daily.    [provider]  Probiotic Product (ALIGN) 4 MG CAPS Take 1 capsule by mouth daily.    [provider]  RESTASIS 0.05 % ophthalmic emulsion Place 1 drop into both eyes 2 (two) times daily. 10/26/14   [provider]    Physical Exam: Vitals:   09/17/18 2015 09/17/18 2208 09/17/18 2209 09/17/18 2213  BP: (!) 153/76 (!) 164/88 (!) 155/92 (!) 157/87  Pulse: 93 95    Resp: 20 18    Temp:  98.4 F (36.9  C)    TempSrc:  Oral    SpO2: 96% 100%  98%    Physical Exam  Constitutional: She is oriented to person, place, and time. She appears well-developed and well-nourished. No distress.  HENT:  Head: Normocephalic.  Mouth/Throat: Oropharynx is clear and moist.  Eyes: Right eye exhibits no discharge. Left eye exhibits no discharge.  Neck: Neck supple.  Cardiovascular: Normal rate, regular rhythm and intact distal pulses.  Pulmonary/Chest: Effort normal and breath sounds normal. No respiratory distress. She has no wheezes. She has no rales.  Abdominal: Soft. Bowel sounds are normal. She exhibits no distension. There is no abdominal tenderness. There is no rebound and no guarding.  Musculoskeletal:        General: No edema.  Neurological: She is alert and oriented to person, place, and time.  Skin: Skin is warm and dry. She is not diaphoretic.     Labs on Admission: I have personally reviewed following labs and imaging studies  CBC: Recent Labs  Lab 09/17/18 1627  WBC 8.0  HGB 13.9  HCT 42.5  MCV 96.2  PLT 481   Basic Metabolic Panel: Recent Labs  Lab 09/17/18 1627  NA 134*  K 4.1  CL 100  CO2 23  GLUCOSE 150*  BUN 9  CREATININE 1.05*   CALCIUM 9.6   GFR: CrCl cannot be calculated (Unknown ideal weight.). Liver Function Tests: No results for input(s): AST, ALT, ALKPHOS, BILITOT, PROT, ALBUMIN in the last 168 hours. No results for input(s): LIPASE, AMYLASE in the last 168 hours. No results for input(s): AMMONIA in the last 168 hours. Coagulation Profile: No results for input(s): INR, PROTIME in the last 168 hours. Cardiac Enzymes: No results for input(s): CKTOTAL, CKMB, CKMBINDEX, TROPONINI in the last 168 hours. BNP (last 3 results) No results for input(s): PROBNP in the last 8760 hours. HbA1C: No results for input(s): HGBA1C in the last 72 hours. CBG: Recent Labs  Lab 09/17/18 1655  GLUCAP 133*   Lipid Profile: No results for input(s): CHOL, HDL, LDLCALC, TRIG, CHOLHDL, LDLDIRECT in the last 72 hours. Thyroid Function Tests: No results for input(s): TSH, T4TOTAL, FREET4, T3FREE, THYROIDAB in the last 72 hours. Anemia Panel: No results for input(s): VITAMINB12, FOLATE, FERRITIN, TIBC, IRON, RETICCTPCT in the last 72 hours. Urine analysis:    Component Value Date/Time   COLORURINE AMBER (A) 09/17/2018 1700   APPEARANCEUR HAZY (A) 09/17/2018 1700   LABSPEC 1.027 09/17/2018 1700   PHURINE 5.0 09/17/2018 1700   GLUCOSEU NEGATIVE 09/17/2018 1700   HGBUR NEGATIVE 09/17/2018 1700   HGBUR 2+ 08/01/2009 1508   BILIRUBINUR NEGATIVE 09/17/2018 1700   BILIRUBINUR negative 04/28/2018 1404   KETONESUR NEGATIVE 09/17/2018 1700   PROTEINUR 30 (A) 09/17/2018 1700   UROBILINOGEN 0.2 04/28/2018 1404   UROBILINOGEN 0.2 01/08/2010 1831   NITRITE NEGATIVE 09/17/2018 1700   LEUKOCYTESUR NEGATIVE 09/17/2018 1700    Radiological Exams on Admission: Ct Head Wo Contrast  Result Date: 09/17/2018 CLINICAL DATA:  85 year old with ataxia. Dizziness. EXAM: CT HEAD WITHOUT CONTRAST TECHNIQUE: Contiguous axial images were obtained from the base of the skull through the vertex without intravenous contrast. COMPARISON:  Head CT  05/01/2017 FINDINGS: Brain: No intracranial hemorrhage, mass effect, or midline shift. Stable degree of atrophy and chronic small vessel ischemia. Remote lacunar infarct versus prominent perivascular space in the left insula. No hydrocephalus. The basilar cisterns are patent. No evidence of territorial infarct or acute ischemia. No extra-axial or intracranial fluid collection. Vascular: Atherosclerosis of skullbase vasculature  without hyperdense vessel or abnormal calcification. Skull: No fracture or focal lesion. Sinuses/Orbits: Paranasal sinuses and mastoid air cells are clear. The visualized orbits are unremarkable. Bilateral cataract resection. Other: None. IMPRESSION: 1. No acute intracranial abnormality. 2. Stable atrophy and chronic small vessel ischemia. Electronically Signed   By: Keith Rake M.D.   On: 09/17/2018 19:01   Mr Brain Wo Contrast  Result Date: 09/17/2018 CLINICAL DATA:  85 y/o  F; Vertigo, episodic, peripheral. EXAM: MRI HEAD WITHOUT CONTRAST TECHNIQUE: Multiplanar, multiecho pulse sequences of the brain and surrounding structures were obtained without intravenous contrast. COMPARISON:  09/17/2018 CT head FINDINGS: Brain: No acute infarction, hemorrhage, hydrocephalus, extra-axial collection or mass lesion. Early confluent nonspecific T2 FLAIR hyperintensities in subcortical and periventricular white matter are compatible with moderate chronic microvascular ischemic changes. Moderate volume loss of the brain. Vascular: Normal flow voids. Skull and upper cervical spine: Normal marrow signal. Sinuses/Orbits: Negative. Other: Bilateral intra-ocular lens replacement. IMPRESSION: 1. No acute intracranial abnormality identified. 2. Moderate chronic microvascular ischemic changes and volume loss of the brain. Electronically Signed   By: Kristine Garbe M.D.   On: 09/17/2018 21:57   Ct Abdomen Pelvis W Contrast  Result Date: 09/17/2018 CLINICAL DATA:  Abdominal pain and diarrhea.  Dizziness. EXAM: CT ABDOMEN AND PELVIS WITH CONTRAST TECHNIQUE: Multidetector CT imaging of the abdomen and pelvis was performed using the standard protocol following bolus administration of intravenous contrast. CONTRAST:  135m OMNIPAQUE IOHEXOL 300 MG/ML  SOLN COMPARISON:  Noncontrast CT 04/08/2018 FINDINGS: Lower chest: Lung bases are clear. No pleural fluid or consolidation. Minimal subpleural nodularity in the right lower lobe is unchanged from prior exam likely scarring. Hepatobiliary: Calcified granuloma. No focal lesion. Gallbladder physiologically distended, no calcified stone. No biliary dilatation. Pancreas: No ductal dilatation or inflammation. Spleen: Normal in size without focal abnormality. Adrenals/Urinary Tract: Normal adrenal glands. No hydronephrosis or perinephric edema. Homogeneous renal enhancement with symmetric excretion on delayed phase imaging. Subcentimeter low-density lesions in the both kidneys are too small to accurately characterize but likely small cysts. Possible punctate nonobstructing lower left renal stone. Urinary bladder is physiologically distended without wall thickening. Stomach/Bowel: Colonic diverticulosis without diverticulitis. No colonic wall thickening or inflammation. No liquid stool in the colon. No small bowel obstruction, inflammation, or evident wall thickening. Appendix not confidently visualized, no pericecal inflammation to suggest appendicitis. Stomach is decompressed. Vascular/Lymphatic: Aortic atherosclerosis without aneurysm. Portal vein and mesenteric vessels are patent. No adenopathy. Reproductive: Post hysterectomy. Ovaries tentatively identified and quiescent. No adnexal mass. Other: No free air, free fluid, or intra-abdominal fluid collection. Tiny fat containing umbilical hernia. Musculoskeletal: Degenerative change in the spine with vacuum phenomena. There are no acute or suspicious osseous abnormalities. IMPRESSION: 1. No acute abnormality or  explanation for abdominal pain. 2. Colonic diverticulosis without diverticulitis. No bowel or colonic inflammation. 3. Punctate nonobstructing left renal stone. 4.  Aortic Atherosclerosis (ICD10-I70.0). Electronically Signed   By: MKeith RakeM.D.   On: 09/17/2018 19:08    EKG: Independently reviewed.  Sinus rhythm, no acute ischemic changes.  Assessment/Plan Principal Problem:   Dizziness Active Problems:   Abdominal pain   AF (paroxysmal atrial fibrillation) ([facility])   Dizziness No infectious signs or symptoms.  Orthostatics negative.  Possibly due to BPPV or  peripheral vertigo, however, patient is not sure if it is related to head movement and denies any room spinning sensation.  CT head and brain MRI negative for acute finding. -PT evaluation for vestibular rehab -Meclizine PRN  ?Abdominal pain Per ED documentation,  patient complained of abdominal pain.  However, when I spoke to her she denied having any abdominal pain or any other symptoms such as nausea, vomiting, diarrhea, or constipation. CT abdomen pelvis showing punctate nonobstructing left renal stone and no other acute abnormality/ explanation for abdominal pain.  UA not suggestive of infection. -Continue to monitor  Paroxysmal atrial fibrillation Currently in sinus rhythm. -Continue home Eliquis and Cardizem  Unable to safely order remainder of home medications at this time as pharmacy medication reconciliation is pending.  DVT prophylaxis: Eliquis Code Status: Patient wishes to be full code. Family Communication: No family available. Disposition Plan: Anticipate discharge after clinical improvement. Consults called: None Admission status: It is my clinical opinion that referral for OBSERVATION is reasonable and necessary in this patient based on the above information provided. The aforementioned taken together are felt to place the patient at high risk for further clinical deterioration. However it is anticipated that  the patient may be medically stable for discharge from the hospital within 24 to 48 hours.  The medical decision making on this patient was of high complexity and the patient is at high risk for clinical deterioration, therefore this is a level 3 visit.  Shela Leff MD [facility] Pager [phone]  If 7PM-7AM, please contact night-coverage [email or web address] Password [facility]  09/17/2018, 11:51 PM

## 2018-09-17 NOTE — ED Provider Notes (Signed)
Pt signed out by Dr. Melina Copa pending MRI for dizziness.   IMPRESSION:  1. No acute intracranial abnormality identified.  2. Moderate chronic microvascular ischemic changes and volume loss  of the brain.     No CVA, so I asked the nurses to get pt up.  She is not orthostatic. She is still very dizzy and unsteady with the walker.  She is nauseous as well.  She is given more zofran and ativan.  Pt d/w Dr. Marlowe Sax ([facility]) for admission.   [place] Pence, MD 09/17/18 [phone]

## 2018-09-17 NOTE — ED Notes (Signed)
Ambulated patient to the restroom. The gait was a little unsteady but she denied dizziness. Once the patient was back in the bed, she stated "The bed is moving and the room is spinning." This lasted for about 5 minutes. PA notified.

## 2018-09-17 NOTE — ED Notes (Signed)
ED TO INPATIENT HANDOFF REPORT  ED Nurse Name and Phone #: [phone] S Name/Age/Gender Ashley Lawson 85 y.o. female Room/Bed: 040C/040C  Code Status   Code Status: Prior  Home/SNF/Other DC home AO x 4   Triage Complete: Triage complete  Chief Complaint Dizziness  Triage Note Pt arrives with daughter with c/o of dizziness that started last night while she was having some diarrhea. Pt states she was up most of the night having loose stools. Pt is alert and 0x4.    Allergies Allergies  Allergen Reactions  . Alendronate Sodium     upset stomach  . Penicillins     Has patient had a PCN reaction causing immediate rash, facial/tongue/throat swelling, SOB or lightheadedness with hypotension: Yes Has patient had a PCN reaction causing severe rash involving mucus membranes or skin necrosis: No Has patient had a PCN reaction that required hospitalization: No Has patient had a PCN reaction occurring within the last 10 years: No If all of the above answers are "NO", then may proceed with Cephalosporin use.   Marland Kitchen Amoxicillin Rash    Has patient had a PCN reaction causing immediate rash, facial/tongue/throat swelling, SOB or lightheadedness with hypotension: yes Has patient had a PCN reaction causing severe rash involving mucus membranes or skin necrosis: no Has patient had a PCN reaction that required hospitalization: no Has patient had PCN reaction within the last 10 years: no  If all of the above answers are "NO", then may proceed with Cephalosporin use.  REACTION: unspecified  . Metronidazole Other (See Comments)    Pounding headaches patient says was bad.  With cipro for diverticulitis rx. Other reaction(s): Other (See Comments) Pounding headaches patient says was bad.With cipro for diverticulitis rx.    Level of Care/Admitting Diagnosis ED Disposition    ED Disposition Condition [facility] Area: [place] [[id number]]  Level of Care:  Med-Surg [16]  I expect the patient will be discharged within 24 hours: Yes  LOW acuity---Tx typically complete <24 hrs---ACUTE conditions typically can be evaluated <24 hours---LABS likely to return to acceptable levels <24 hours---IS near functional baseline---EXPECTED to return to current living arrangement---NOT newly hypoxic: Meets criteria for 5C-Observation unit  Covid Evaluation: Person Under Investigation (PUI)  Isolation Risk Level: Low Risk/Droplet (Less than 4L [place] supplementation)  Diagnosis: Dizziness [854627]  Admitting Physician: Shela Leff [[id number]]  Attending Physician: Shela Leff [[id number]]  PT Class (Do Not Modify): Observation [104]  PT Acc Code (Do Not Modify): Observation [10022]       B Medical/Surgery History Past Medical History:  Diagnosis Date  . Abdominal pain 12/2009   hospitalized  . Allergic rhinitis   . Chronic constipation   . Compression fx, lumbar spine ([place])   . DDD (degenerative disc disease), lumbar   . Diverticulosis of colon   . Dog bite(E906.0) 08/30/2011  . GERD (gastroesophageal reflux disease)   . Hepatic steatosis   . Hyperlipidemia   . Hyperplastic colon polyp   . IBS (irritable bowel syndrome)    constipation predominant  . Internal hemorrhoids   . Osteoporosis    dexa 2011 -2.7 hip nl spine  . PAF (paroxysmal atrial fibrillation) ([place])    a. dx 05/2017.  Marland Kitchen PMR (polymyalgia rheumatica) ([facility]) 08/30/2011   under rheum care  on low dose pred 5 mg   . Renal disorder    Past Surgical History:  Procedure Laterality Date  . ABDOMINAL HYSTERECTOMY    . CATARACT EXTRACTION  2010   both  . COLONOSCOPY  2016 was most recent     A IV Location/Drains/Wounds Patient Lines/Drains/Airways Status   Active Line/Drains/Airways    Name:   Placement date:   Placement time:   Site:   Days:   Peripheral IV 09/17/18 Right Antecubital   09/17/18    1718    Antecubital   less than 1          Intake/Output Last 24  hours  Intake/Output Summary (Last 24 hours) at 09/17/2018 2315 Last data filed at 09/17/2018 1959 Gross per 24 hour  Intake 495 ml  Output -  Net 495 ml    Labs/Imaging Results for orders placed or performed during the hospital encounter of 09/17/18 (from the past 48 hour(s))  Basic metabolic panel     Status: Abnormal   Collection Time: 09/17/18  4:27 PM  Result Value Ref Range   Sodium 134 (L) 135 - 145 mmol/L   Potassium 4.1 3.5 - 5.1 mmol/L   Chloride 100 98 - 111 mmol/L   CO2 23 22 - 32 mmol/L   Glucose, Bld 150 (H) 70 - 99 mg/dL   BUN 9 8 - 23 mg/dL   Creatinine, Ser 1.05 (H) 0.44 - 1.00 mg/dL   Calcium 9.6 8.9 - 10.3 mg/dL   GFR calc non Af Amer 49 (L) >60 mL/min   GFR calc Af Amer 56 (L) >60 mL/min   Anion gap 11 5 - 15    Comment: Performed at [facility], [address], [place], [place] [address]  CBC     Status: None   Collection Time: 09/17/18  4:27 PM  Result Value Ref Range   WBC 8.0 4.0 - 10.5 K/uL   RBC 4.42 3.87 - 5.11 MIL/uL   Hemoglobin 13.9 12.0 - 15.0 g/dL   HCT 42.5 36.0 - 46.0 %   MCV 96.2 80.0 - 100.0 fL   MCH 31.4 26.0 - 34.0 pg   MCHC 32.7 30.0 - 36.0 g/dL   RDW 12.3 11.5 - 15.5 %   Platelets 247 150 - 400 K/uL   nRBC 0.0 0.0 - 0.2 %    Comment: Performed at [facility], [place] [address] [place], [place] [address]  CBG monitoring, ED     Status: Abnormal   Collection Time: 09/17/18  4:55 PM  Result Value Ref Range   Glucose-Capillary 133 (H) 70 - 99 mg/dL   Comment 1 Notify RN    Comment 2 Document in Chart   Urinalysis, Routine w reflex microscopic     Status: Abnormal   Collection Time: 09/17/18  5:00 PM  Result Value Ref Range   Color, Urine AMBER (A) YELLOW    Comment: BIOCHEMICALS MAY BE AFFECTED BY COLOR   APPearance HAZY (A) CLEAR   Specific Gravity, Urine 1.027 1.005 - 1.030   pH 5.0 5.0 - 8.0   Glucose, UA NEGATIVE NEGATIVE mg/dL   Hgb urine dipstick NEGATIVE NEGATIVE   Bilirubin Urine NEGATIVE NEGATIVE    Ketones, ur NEGATIVE NEGATIVE mg/dL   Protein, ur 30 (A) NEGATIVE mg/dL   Nitrite NEGATIVE NEGATIVE   Leukocytes,Ua NEGATIVE NEGATIVE   RBC / HPF 0-5 0 - 5 RBC/hpf   WBC, UA 0-5 0 - 5 WBC/hpf   Bacteria, UA RARE (A) NONE SEEN   Squamous Epithelial / LPF 0-5 0 - 5   Mucus PRESENT    Hyaline Casts, UA PRESENT     Comment: Performed at  [facility], [place] [address], [place], [place] [address]   Ct Head Wo Contrast  Result Date: 09/17/2018 CLINICAL DATA:  85 year old with ataxia. Dizziness. EXAM: CT HEAD WITHOUT CONTRAST TECHNIQUE: Contiguous axial images were obtained from the base of the skull through the vertex without intravenous contrast. COMPARISON:  Head CT 05/01/2017 FINDINGS: Brain: No intracranial hemorrhage, mass effect, or midline shift. Stable degree of atrophy and chronic small vessel ischemia. Remote lacunar infarct versus prominent perivascular space in the left insula. No hydrocephalus. The basilar cisterns are patent. No evidence of territorial infarct or acute ischemia. No extra-axial or intracranial fluid collection. Vascular: Atherosclerosis of skullbase vasculature without hyperdense vessel or abnormal calcification. Skull: No fracture or focal lesion. Sinuses/Orbits: Paranasal sinuses and mastoid air cells are clear. The visualized orbits are unremarkable. Bilateral cataract resection. Other: None. IMPRESSION: 1. No acute intracranial abnormality. 2. Stable atrophy and chronic small vessel ischemia. Electronically Signed   By: Keith Rake M.D.   On: 09/17/2018 19:01   Mr Brain Wo Contrast  Result Date: 09/17/2018 CLINICAL DATA:  85 y/o  F; Vertigo, episodic, peripheral. EXAM: MRI HEAD WITHOUT CONTRAST TECHNIQUE: Multiplanar, multiecho pulse sequences of the brain and surrounding structures were obtained without intravenous contrast. COMPARISON:  09/17/2018 CT head FINDINGS: Brain: No acute infarction, hemorrhage, hydrocephalus, extra-axial collection or mass lesion.  Early confluent nonspecific T2 FLAIR hyperintensities in subcortical and periventricular white matter are compatible with moderate chronic microvascular ischemic changes. Moderate volume loss of the brain. Vascular: Normal flow voids. Skull and upper cervical spine: Normal marrow signal. Sinuses/Orbits: Negative. Other: Bilateral intra-ocular lens replacement. IMPRESSION: 1. No acute intracranial abnormality identified. 2. Moderate chronic microvascular ischemic changes and volume loss of the brain. Electronically Signed   By: Kristine Garbe M.D.   On: 09/17/2018 21:57   Ct Abdomen Pelvis W Contrast  Result Date: 09/17/2018 CLINICAL DATA:  Abdominal pain and diarrhea. Dizziness. EXAM: CT ABDOMEN AND PELVIS WITH CONTRAST TECHNIQUE: Multidetector CT imaging of the abdomen and pelvis was performed using the standard protocol following bolus administration of intravenous contrast. CONTRAST:  156m OMNIPAQUE IOHEXOL 300 MG/ML  SOLN COMPARISON:  Noncontrast CT 04/08/2018 FINDINGS: Lower chest: Lung bases are clear. No pleural fluid or consolidation. Minimal subpleural nodularity in the right lower lobe is unchanged from prior exam likely scarring. Hepatobiliary: Calcified granuloma. No focal lesion. Gallbladder physiologically distended, no calcified stone. No biliary dilatation. Pancreas: No ductal dilatation or inflammation. Spleen: Normal in size without focal abnormality. Adrenals/Urinary Tract: Normal adrenal glands. No hydronephrosis or perinephric edema. Homogeneous renal enhancement with symmetric excretion on delayed phase imaging. Subcentimeter low-density lesions in the both kidneys are too small to accurately characterize but likely small cysts. Possible punctate nonobstructing lower left renal stone. Urinary bladder is physiologically distended without wall thickening. Stomach/Bowel: Colonic diverticulosis without diverticulitis. No colonic wall thickening or inflammation. No liquid stool in the  colon. No small bowel obstruction, inflammation, or evident wall thickening. Appendix not confidently visualized, no pericecal inflammation to suggest appendicitis. Stomach is decompressed. Vascular/Lymphatic: Aortic atherosclerosis without aneurysm. Portal vein and mesenteric vessels are patent. No adenopathy. Reproductive: Post hysterectomy. Ovaries tentatively identified and quiescent. No adnexal mass. Other: No free air, free fluid, or intra-abdominal fluid collection. Tiny fat containing umbilical hernia. Musculoskeletal: Degenerative change in the spine with vacuum phenomena. There are no acute or suspicious osseous abnormalities. IMPRESSION: 1. No acute abnormality or explanation for abdominal pain. 2. Colonic diverticulosis without diverticulitis. No bowel or colonic inflammation. 3. Punctate nonobstructing left renal stone. 4.  Aortic Atherosclerosis (ICD10-I70.0). Electronically Signed   By: Keith Rake M.D.   On: 09/17/2018 19:08    Pending Labs Unresulted Labs (From admission, onward)    Start     Ordered   09/17/18 1659  Novel Coronavirus,NAA,(SEND-OUT TO REF LAB - TAT 24-48 hrs); Hosp Order  (Asymptomatic Patients Labs)  Once,   STAT    Question:  Rule Out  Answer:  Yes   09/17/18 1658   09/17/18 1657  Urine culture  ONCE - STAT,   STAT    Question:  Patient immune status  Answer:  Normal   09/17/18 1658          Vitals/Pain Today's Vitals   09/17/18 2015 09/17/18 2208 09/17/18 2209 09/17/18 2213  BP: (!) 153/76 (!) 164/88 (!) 155/92 (!) 157/87  Pulse: 93 95    Resp: 20 18    Temp:  98.4 F (36.9 C)    TempSrc:  Oral    SpO2: 96% 100%  98%  PainSc:        Isolation Precautions No active isolations  Medications Medications  ondansetron (ZOFRAN) injection 4 mg (4 mg Intravenous Not Given 09/17/18 2305)  sodium chloride flush (NS) 0.9 % injection 3 mL (3 mLs Intravenous Given 09/17/18 1740)  sodium chloride 0.9 % bolus 500 mL (0 mLs Intravenous Stopped 09/17/18 1959)   iohexol (OMNIPAQUE) 300 MG/ML solution 100 mL (100 mLs Intravenous Contrast Given 09/17/18 1758)  meclizine (ANTIVERT) tablet 12.5 mg (12.5 mg Oral Given 09/17/18 2041)  LORazepam (ATIVAN) injection 1 mg (1 mg Intravenous Given 09/17/18 2057)  ondansetron (ZOFRAN) injection 4 mg (4 mg Intravenous Given 09/17/18 2304)  LORazepam (ATIVAN) injection 1 mg (1 mg Intravenous Given 09/17/18 2305)    Mobility One assistance  High fall risk   Focused Assessments Pt is AO x 4 c/o dizziness, walking with very unsteady gait. Pt admitted for observation. SR on the monitor no neuro deficit, negative for stroke on CT head or MRI.   R Recommendations: See Admitting Provider Note  Report given to:   Additional Notes:

## 2018-09-17 NOTE — ED Provider Notes (Addendum)
85 year old female presents with dizziness, urinary frequency, and LLQ abdominal pain. She is hypertensive but otherwise vitals are normal. Labs are normal. UA is clean. CT head and abdomen are normal. Rechecked pt and she still feels very dizzy. It is pronounced when she is moving but in the stretcher she feels okay. Sounds like BPPV but pt is very worried about a stroke. Will obtain MRI brain and give Meclizine. Care signed out to Dr. Gilford Raid who will dispo accordingly.       Recardo Evangelist, PA-C 09/17/18 2037    Hayden Rasmussen, MD 09/18/18 1044

## 2018-09-18 ENCOUNTER — Encounter (HOSPITAL_COMMUNITY): Payer: Self-pay

## 2018-09-18 DIAGNOSIS — R42 Dizziness and giddiness: Secondary | ICD-10-CM | POA: Diagnosis not present

## 2018-09-18 DIAGNOSIS — R262 Difficulty in walking, not elsewhere classified: Secondary | ICD-10-CM

## 2018-09-18 DIAGNOSIS — R350 Frequency of micturition: Secondary | ICD-10-CM

## 2018-09-18 DIAGNOSIS — R1010 Upper abdominal pain, unspecified: Secondary | ICD-10-CM

## 2018-09-18 LAB — URINE CULTURE
Culture: NO GROWTH
Special Requests: NORMAL

## 2018-09-18 MED ORDER — MAGNESIUM HYDROXIDE 400 MG/5ML PO SUSP: 15.0000 mL | Freq: Every day | ORAL | Status: DC | PRN

## 2018-09-18 MED ORDER — POLYETHYLENE GLYCOL 3350 17 G PO PACK: 17.0000 g | PACK | Freq: Every day | ORAL | Status: DC

## 2018-09-18 MED ORDER — CYCLOSPORINE 0.05 % OP EMUL
1.0000 [drp] | Freq: Two times a day (BID) | OPHTHALMIC | Status: DC
  Filled 2018-09-18: qty 30

## 2018-09-18 MED ORDER — ONDANSETRON HCL 4 MG/2ML IJ SOLN
4.0000 mg | Freq: Four times a day (QID) | INTRAMUSCULAR | Status: DC | PRN
  Administered 2018-09-18: 4 mg via INTRAVENOUS
  Filled 2018-09-18: qty 2

## 2018-09-18 MED ORDER — ONDANSETRON 4 MG PO TBDP: 4.0000 mg | ORAL_TABLET | Freq: Three times a day (TID) | ORAL | 0 refills | Status: DC | PRN

## 2018-09-18 MED ORDER — MECLIZINE HCL 25 MG PO TABS: 25.0000 mg | ORAL_TABLET | Freq: Once | ORAL | Status: DC

## 2018-09-18 MED ORDER — MECLIZINE HCL 25 MG PO TABS: 25.0000 mg | ORAL_TABLET | Freq: Three times a day (TID) | ORAL | 1 refills | Status: DC | PRN

## 2018-09-18 MED ORDER — CALCIUM CARBONATE 1250 (500 CA) MG PO TABS: 1250.0000 mg | ORAL_TABLET | Freq: Two times a day (BID) | ORAL | Status: DC

## 2018-09-18 MED ORDER — LORATADINE 10 MG PO TABS: 10.0000 mg | ORAL_TABLET | Freq: Every day | ORAL | Status: DC | PRN

## 2018-09-18 MED ORDER — ADULT MULTIVITAMIN W/MINERALS CH: ORAL_TABLET | Freq: Every day | ORAL | Status: DC

## 2018-09-18 MED ORDER — FLUTICASONE PROPIONATE 50 MCG/ACT NA SUSP
2.0000 | Freq: Every day | NASAL | Status: DC
  Filled 2018-09-18: qty 16

## 2018-09-18 NOTE — ED Notes (Signed)
ED TO INPATIENT HANDOFF REPORT  ED Nurse Name and Phone #:   Lenice Pressman  [phone]  S Name/Age/Gender Ashley Lawson 85 y.o. female Room/Bed: 032C/032C  Code Status   Code Status: Full Code  Home/SNF/Other Home Patient oriented to: self, place, time and situation Is this baseline? Yes   Triage Complete: Triage complete  Chief Complaint Dizziness  Triage Note Pt arrives with daughter with c/o of dizziness that started last night while she was having some diarrhea. Pt states she was up most of the night having loose stools. Pt is alert and 0x4.    Allergies Allergies  Allergen Reactions  . Alendronate Sodium     upset stomach  . Penicillins     Has patient had a PCN reaction causing immediate rash, facial/tongue/throat swelling, SOB or lightheadedness with hypotension: Yes Has patient had a PCN reaction causing severe rash involving mucus membranes or skin necrosis: No Has patient had a PCN reaction that required hospitalization: No Has patient had a PCN reaction occurring within the last 10 years: No If all of the above answers are "NO", then may proceed with Cephalosporin use.   Marland Kitchen Amoxicillin Rash    Has patient had a PCN reaction causing immediate rash, facial/tongue/throat swelling, SOB or lightheadedness with hypotension: yes Has patient had a PCN reaction causing severe rash involving mucus membranes or skin necrosis: no Has patient had a PCN reaction that required hospitalization: no Has patient had PCN reaction within the last 10 years: no  If all of the above answers are "NO", then may proceed with Cephalosporin use.  REACTION: unspecified  . Metronidazole Other (See Comments)    Pounding headaches patient says was bad.  With cipro for diverticulitis rx. Other reaction(s): Other (See Comments) Pounding headaches patient says was bad.With cipro for diverticulitis rx.    Level of Care/Admitting Diagnosis ED Disposition    ED Disposition Condition [facility] Area: [place] [[id number]]  Level of Care: Med-Surg [16]  I expect the patient will be discharged within 24 hours: Yes  LOW acuity---Tx typically complete <24 hrs---ACUTE conditions typically can be evaluated <24 hours---LABS likely to return to acceptable levels <24 hours---IS near functional baseline---EXPECTED to return to current living arrangement---NOT newly hypoxic: Meets criteria for 5C-Observation unit  Covid Evaluation: Screening Protocol (No Symptoms)  Diagnosis: Dizziness [637858]  Admitting Physician: Shela Leff [[id number]]  Attending Physician: Shela Leff [[id number]]  PT Class (Do Not Modify): Observation [104]  PT Acc Code (Do Not Modify): Observation [10022]       B Medical/Surgery History Past Medical History:  Diagnosis Date  . Abdominal pain 12/2009   hospitalized  . Allergic rhinitis   . Chronic constipation   . Compression fx, lumbar spine ([place])   . DDD (degenerative disc disease), lumbar   . Diverticulosis of colon   . Dog bite(E906.0) 08/30/2011  . GERD (gastroesophageal reflux disease)   . Hepatic steatosis   . Hyperlipidemia   . Hyperplastic colon polyp   . IBS (irritable bowel syndrome)    constipation predominant  . Internal hemorrhoids   . Osteoporosis    dexa 2011 -2.7 hip nl spine  . PAF (paroxysmal atrial fibrillation) ([place])    a. dx 05/2017.  Marland Kitchen PMR (polymyalgia rheumatica) ([facility]) 08/30/2011   under rheum care  on low dose pred 5 mg   . Renal disorder    Past Surgical History:  Procedure Laterality Date  . ABDOMINAL HYSTERECTOMY    .  CATARACT EXTRACTION  2010   both  . COLONOSCOPY  2016 was most recent     A IV Location/Drains/Wounds Patient Lines/Drains/Airways Status   Active Line/Drains/Airways    Name:   Placement date:   Placement time:   Site:   Days:   Peripheral IV 09/17/18 Right Antecubital   09/17/18    1718    Antecubital   1          Intake/Output Last 24  hours  Intake/Output Summary (Last 24 hours) at 09/18/2018 0520 Last data filed at 09/17/2018 1959 Gross per 24 hour  Intake 495 ml  Output -  Net 495 ml    Labs/Imaging Results for orders placed or performed during the hospital encounter of 09/17/18 (from the past 48 hour(s))  Basic metabolic panel     Status: Abnormal   Collection Time: 09/17/18  4:27 PM  Result Value Ref Range   Sodium 134 (L) 135 - 145 mmol/L   Potassium 4.1 3.5 - 5.1 mmol/L   Chloride 100 98 - 111 mmol/L   CO2 23 22 - 32 mmol/L   Glucose, Bld 150 (H) 70 - 99 mg/dL   BUN 9 8 - 23 mg/dL   Creatinine, Ser 1.05 (H) 0.44 - 1.00 mg/dL   Calcium 9.6 8.9 - 10.3 mg/dL   GFR calc non Af Amer 49 (L) >60 mL/min   GFR calc Af Amer 56 (L) >60 mL/min   Anion gap 11 5 - 15    Comment: Performed at [facility], [address], [place], [place] [address]  CBC     Status: None   Collection Time: 09/17/18  4:27 PM  Result Value Ref Range   WBC 8.0 4.0 - 10.5 K/uL   RBC 4.42 3.87 - 5.11 MIL/uL   Hemoglobin 13.9 12.0 - 15.0 g/dL   HCT 42.5 36.0 - 46.0 %   MCV 96.2 80.0 - 100.0 fL   MCH 31.4 26.0 - 34.0 pg   MCHC 32.7 30.0 - 36.0 g/dL   RDW 12.3 11.5 - 15.5 %   Platelets 247 150 - 400 K/uL   nRBC 0.0 0.0 - 0.2 %    Comment: Performed at [facility], [place] [address], [place], [place] [address]  CBG monitoring, ED     Status: Abnormal   Collection Time: 09/17/18  4:55 PM  Result Value Ref Range   Glucose-Capillary 133 (H) 70 - 99 mg/dL   Comment 1 Notify RN    Comment 2 Document in Chart   Urinalysis, Routine w reflex microscopic     Status: Abnormal   Collection Time: 09/17/18  5:00 PM  Result Value Ref Range   Color, Urine AMBER (A) YELLOW    Comment: BIOCHEMICALS MAY BE AFFECTED BY COLOR   APPearance HAZY (A) CLEAR   Specific Gravity, Urine 1.027 1.005 - 1.030   pH 5.0 5.0 - 8.0   Glucose, UA NEGATIVE NEGATIVE mg/dL   Hgb urine dipstick NEGATIVE NEGATIVE   Bilirubin Urine NEGATIVE NEGATIVE    Ketones, ur NEGATIVE NEGATIVE mg/dL   Protein, ur 30 (A) NEGATIVE mg/dL   Nitrite NEGATIVE NEGATIVE   Leukocytes,Ua NEGATIVE NEGATIVE   RBC / HPF 0-5 0 - 5 RBC/hpf   WBC, UA 0-5 0 - 5 WBC/hpf   Bacteria, UA RARE (A) NONE SEEN   Squamous Epithelial / LPF 0-5 0 - 5   Mucus PRESENT    Hyaline Casts, UA PRESENT     Comment: Performed  at [facility], [place] [address] [place], [place] [address]   Ct Head Wo Contrast  Result Date: 09/17/2018 CLINICAL DATA:  85 year old with ataxia. Dizziness. EXAM: CT HEAD WITHOUT CONTRAST TECHNIQUE: Contiguous axial images were obtained from the base of the skull through the vertex without intravenous contrast. COMPARISON:  Head CT 05/01/2017 FINDINGS: Brain: No intracranial hemorrhage, mass effect, or midline shift. Stable degree of atrophy and chronic small vessel ischemia. Remote lacunar infarct versus prominent perivascular space in the left insula. No hydrocephalus. The basilar cisterns are patent. No evidence of territorial infarct or acute ischemia. No extra-axial or intracranial fluid collection. Vascular: Atherosclerosis of skullbase vasculature without hyperdense vessel or abnormal calcification. Skull: No fracture or focal lesion. Sinuses/Orbits: Paranasal sinuses and mastoid air cells are clear. The visualized orbits are unremarkable. Bilateral cataract resection. Other: None. IMPRESSION: 1. No acute intracranial abnormality. 2. Stable atrophy and chronic small vessel ischemia. Electronically Signed   By: Keith Rake M.D.   On: 09/17/2018 19:01   Mr Brain Wo Contrast  Result Date: 09/17/2018 CLINICAL DATA:  85 y/o  F; Vertigo, episodic, peripheral. EXAM: MRI HEAD WITHOUT CONTRAST TECHNIQUE: Multiplanar, multiecho pulse sequences of the brain and surrounding structures were obtained without intravenous contrast. COMPARISON:  09/17/2018 CT head FINDINGS: Brain: No acute infarction, hemorrhage, hydrocephalus, extra-axial collection or mass lesion.  Early confluent nonspecific T2 FLAIR hyperintensities in subcortical and periventricular white matter are compatible with moderate chronic microvascular ischemic changes. Moderate volume loss of the brain. Vascular: Normal flow voids. Skull and upper cervical spine: Normal marrow signal. Sinuses/Orbits: Negative. Other: Bilateral intra-ocular lens replacement. IMPRESSION: 1. No acute intracranial abnormality identified. 2. Moderate chronic microvascular ischemic changes and volume loss of the brain. Electronically Signed   By: Kristine Garbe M.D.   On: 09/17/2018 21:57   Ct Abdomen Pelvis W Contrast  Result Date: 09/17/2018 CLINICAL DATA:  Abdominal pain and diarrhea. Dizziness. EXAM: CT ABDOMEN AND PELVIS WITH CONTRAST TECHNIQUE: Multidetector CT imaging of the abdomen and pelvis was performed using the standard protocol following bolus administration of intravenous contrast. CONTRAST:  15m OMNIPAQUE IOHEXOL 300 MG/ML  SOLN COMPARISON:  Noncontrast CT 04/08/2018 FINDINGS: Lower chest: Lung bases are clear. No pleural fluid or consolidation. Minimal subpleural nodularity in the right lower lobe is unchanged from prior exam likely scarring. Hepatobiliary: Calcified granuloma. No focal lesion. Gallbladder physiologically distended, no calcified stone. No biliary dilatation. Pancreas: No ductal dilatation or inflammation. Spleen: Normal in size without focal abnormality. Adrenals/Urinary Tract: Normal adrenal glands. No hydronephrosis or perinephric edema. Homogeneous renal enhancement with symmetric excretion on delayed phase imaging. Subcentimeter low-density lesions in the both kidneys are too small to accurately characterize but likely small cysts. Possible punctate nonobstructing lower left renal stone. Urinary bladder is physiologically distended without wall thickening. Stomach/Bowel: Colonic diverticulosis without diverticulitis. No colonic wall thickening or inflammation. No liquid stool in the  colon. No small bowel obstruction, inflammation, or evident wall thickening. Appendix not confidently visualized, no pericecal inflammation to suggest appendicitis. Stomach is decompressed. Vascular/Lymphatic: Aortic atherosclerosis without aneurysm. Portal vein and mesenteric vessels are patent. No adenopathy. Reproductive: Post hysterectomy. Ovaries tentatively identified and quiescent. No adnexal mass. Other: No free air, free fluid, or intra-abdominal fluid collection. Tiny fat containing umbilical hernia. Musculoskeletal: Degenerative change in the spine with vacuum phenomena. There are no acute or suspicious osseous abnormalities. IMPRESSION: 1. No acute abnormality or explanation for abdominal pain. 2. Colonic diverticulosis without diverticulitis. No bowel or colonic inflammation. 3. Punctate nonobstructing left renal stone.  4.  Aortic Atherosclerosis (ICD10-I70.0). Electronically Signed   By: Keith Rake M.D.   On: 09/17/2018 19:08    Pending Labs Unresulted Labs (From admission, onward)    Start     Ordered   09/17/18 1659  Novel Coronavirus,NAA,(SEND-OUT TO REF LAB - TAT 24-48 hrs); Hosp Order  (Asymptomatic Patients Labs)  Once,   STAT    Question:  Rule Out  Answer:  Yes   09/17/18 1658   09/17/18 1657  Urine culture  ONCE - STAT,   STAT    Question:  Patient immune status  Answer:  Normal   09/17/18 1658          Vitals/Pain Today's Vitals   09/18/18 0330 09/18/18 0345 09/18/18 0500 09/18/18 0518  BP: 117/73 120/85 120/70   Pulse: 82 81    Resp: 18 18 17    Temp:      TempSrc:      SpO2: 95% 92%    PainSc:    Asleep    Isolation Precautions No active isolations  Medications Medications  diltiazem (CARDIZEM CD) 24 hr capsule 120 mg (has no administration in time range)  apixaban (ELIQUIS) tablet 5 mg (has no administration in time range)  acetaminophen (TYLENOL) tablet 650 mg (has no administration in time range)    Or  acetaminophen (TYLENOL) suppository 650 mg  (has no administration in time range)  meclizine (ANTIVERT) tablet 12.5 mg (has no administration in time range)  sodium chloride flush (NS) 0.9 % injection 3 mL (3 mLs Intravenous Given 09/17/18 1740)  sodium chloride 0.9 % bolus 500 mL (0 mLs Intravenous Stopped 09/17/18 1959)  iohexol (OMNIPAQUE) 300 MG/ML solution 100 mL (100 mLs Intravenous Contrast Given 09/17/18 1758)  meclizine (ANTIVERT) tablet 12.5 mg (12.5 mg Oral Given 09/17/18 2041)  LORazepam (ATIVAN) injection 1 mg (1 mg Intravenous Given 09/17/18 2057)  ondansetron (ZOFRAN) injection 4 mg (4 mg Intravenous Given 09/17/18 2304)  LORazepam (ATIVAN) injection 1 mg (1 mg Intravenous Given 09/17/18 2305)    Mobility walks High fall risk   Focused Assessments Neuro Assessment Handoff:  Swallow screen pass? Yes  Cardiac Rhythm: Normal sinus rhythm NIH Stroke Scale ( + Modified Stroke Scale Criteria)  Interval: Initial Level of Consciousness (1a.)   : Alert, keenly responsive LOC Questions (1b. )   +: Answers both questions correctly LOC Commands (1c. )   + : Performs both tasks correctly Best Gaze (2. )  +: Normal Visual (3. )  +: No visual loss Facial Palsy (4. )    : Normal symmetrical movements Motor Arm, Left (5a. )   +: No drift Motor Arm, Right (5b. )   +: No drift Motor Leg, Left (6a. )   +: No drift Motor Leg, Right (6b. )   +: No drift Limb Ataxia (7. ): Absent Sensory (8. )   +: Normal, no sensory loss Best Language (9. )   +: No aphasia Dysarthria (10. ): Normal Extinction/Inattention (11.)   +: No Abnormality Modified SS Total  +: 0 Complete NIHSS TOTAL: 0 Last date known well: 09/16/18   Neuro Assessment: Within Defined Limits Neuro Checks:   Initial (09/17/18 1654)  Last Documented NIHSS Modified Score: 0 (09/17/18 1850) Has TPA been given? No If patient is a Neuro Trauma and patient is going to OR before floor call report to [place] nurse: [phone] or [phone]     R Recommendations: See  Admitting Provider Note  Report given to:   Additional  Notes:

## 2018-09-18 NOTE — Progress Notes (Signed)
Pt presented to the floor alert & oriented times 4. Pt stated that she was in no pain. Pt had no skin issues to report. Pt was educated about the floor and how to use the call bell system. The phone and the call bell was placed in the pt reach and she was instructed to use the call bell if she has any issues.

## 2018-09-18 NOTE — Evaluation (Signed)
Physical Therapy Evaluation/Vestibular Assessment Patient Details Name: Ashley Lawson MRN: [id number] DOB: [birth year] Today's Date: 09/18/2018   History of Present Illness  85 y.o. female admitted on 09/17/18 for dizziness.  MRI negative.  Pt with significant PMH of PMR, osteoporosis, diverticulitis, cataract surgery.   Clinical Impression  Pt tested positive for L posterior canal BPPV canalithiasis) and was treated x 2 with Epley's to the left.  Pt was unable to tolerate more treatment and symptoms did not resolve (improved, but did not resolve) yet with treatment.  She will need follow up therapy preferably at home with a vestibular therapist.  She is safer mobilizing on her feet with RW, so I have recommended one for home use until she feels better.   PT to follow acutely for deficits listed below.      Follow Up Recommendations Home health PT;Other (comment)(vestibular)    Equipment Recommendations  Rolling walker with 5" wheels    Recommendations for Other Services   NA    Precautions / Restrictions Precautions Precautions: Fall Precaution Comments: left lateral lean      Mobility  Bed Mobility Overal bed mobility: Needs Assistance Bed Mobility: Supine to Sit;Sit to Supine;Rolling;Sidelying to Sit Rolling: Min assist Sidelying to sit: Min assist Supine to sit: Min assist Sit to supine: Min assist   General bed mobility comments: Min assist more because I was making her feel dizzy and for safety than because she needed physical assist (due to vestibular testing).   Transfers Overall transfer level: Needs assistance Equipment used: Rolling walker (2 wheeled);1 person hand held assist Transfers: Sit to/from Stand Sit to Stand: Min guard         General transfer comment: Min gaurd assist for safety with RW, min hand held assist without RW for steadiness.   Ambulation/Gait Ambulation/Gait assistance: Min guard;Supervision Gait Distance (Feet): 20 Feet(x2) Assistive  device: Rolling walker (2 wheeled);1 person hand held assist Gait Pattern/deviations: Step-through pattern;Staggering left;Staggering right     General Gait Details: Pt with mildly staggering gait pattern, min hand held assist without assistive device, supervision with RW, encouraged RW use.       Balance Overall balance assessment: Needs assistance Sitting-balance support: Feet supported;Bilateral upper extremity supported;No upper extremity supported Sitting balance-Leahy Scale: Good     Standing balance support: Bilateral upper extremity supported;Single extremity supported Standing balance-Leahy Scale: Poor Standing balance comment: need support in standing.              09/18/18 1153  Vestibular Assessment  General Observation Pt reports cataract surgery bil (not recent) wears glasses for reading, no tinnitus or fullness, no recent head trauma or whiplash injury, no hearing changes or visual changes, no recent URI, no recent antibiotic use, no recent changes in any of her meds, no h/o vestibular issues before.   Symptom Behavior  Subjective history of current problem spinning sensation  Type of Dizziness  Spinning;Imbalance  Frequency of Dizziness intermittent  Duration of Dizziness short  Symptom Nature Motion provoked;Positional;Intermittent  Aggravating Factors Activity in general;Turning head quickly  Relieving Factors Closing eyes;Rest;Slow movements  Progression of Symptoms Better  History of similar episodes no  Oculomotor Exam  Oculomotor Alignment Normal  Spontaneous Absent  Gaze-induced  Absent  Smooth Pursuits Saccades (considered "normal" for her age)  Saccades Intact  Comment HIT (-) bil, with guarding  Vestibulo-Ocular Reflex  VOR 1 Head Only (x 1 viewing) normal, negative symptoms both vertical and horizontal  Auditory  Comments grossly normal hearing bil with finger  rub test  Positional Testing  Dix-Hallpike Dix-Hallpike Right;Dix-Hallpike Left   Horizontal Canal Testing Horizontal Canal Right;Horizontal Canal Left  Dix-Hallpike Right  Dix-Hallpike Right Duration 0  Dix-Hallpike Right Symptoms No nystagmus  Dix-Hallpike Left  Dix-Hallpike Left Duration 30-45 sec  Dix-Hallpike Left Symptoms Upbeat, left rotatory nystagmus  Horizontal Canal Right  Horizontal Canal Right Duration 0  Horizontal Canal Right Symptoms Normal;Other (comment) (signs of intruding rotational nystagmus)  Horizontal Canal Left  Horizontal Canal Left Duration 0  Horizontal Canal Left Symptoms Normal   09/18/2018 Epley's preformed x 2 to the left with improvement, but not resolution of symptoms.  When she sat up from the first Epley's she leaned ~45 degree angle to the left, second Epley's she was in midline when she came to sitting, but continue to have brisk rotational nystagmus throughout the treatment (+) nausea requiring IV nausea meds in the middle of our session.                      Pertinent Vitals/Pain Pain Assessment: No/denies pain    Home Living Family/patient expects to be discharged to:: Private residence Living Arrangements: Children Available Help at Discharge: Family;Available PRN/intermittently(daughter) Type of Home: House       Home Layout: Two level;Full bath on main level(tub upstairs that she occasionally "soaks") Home Equipment: Shower seat - built in      Prior Function Level of Independence: Independent         Comments: drives, likes to play cards, get her hair and nails done.      Hand Dominance   Dominant Hand: Right    Extremity/Trunk Assessment   Upper Extremity Assessment Upper Extremity Assessment: Overall WFL for tasks assessed    Lower Extremity Assessment Lower Extremity Assessment: Overall WFL for tasks assessed    Cervical / Trunk Assessment Cervical / Trunk Assessment: Normal  Communication   Communication: No difficulties  Cognition Arousal/Alertness: Awake/alert Behavior During  Therapy: WFL for tasks assessed/performed Overall Cognitive Status: Within Functional Limits for tasks assessed                                 General Comments: mildly forgetful      General Comments General comments (skin integrity, edema, etc.): asked RN for nausea meds during treatment and testing for vestibular pathology.         Assessment/Plan    PT Assessment Patient needs continued PT services  PT Problem List Decreased balance;Decreased mobility;Decreased activity tolerance;Decreased knowledge of use of DME;Decreased knowledge of precautions       PT Treatment Interventions DME instruction;Gait training;Stair training;Therapeutic activities;Functional mobility training;Therapeutic exercise;Balance training;Patient/family education    PT Goals (Current goals can be found in the Care Plan section)  Acute Rehab PT Goals Patient Stated Goal: to get to her hair dresser soon.  PT Goal Formulation: With patient Time For Goal Achievement: 10/02/18 Potential to Achieve Goals: Good    Frequency Min 4X/week           AM-PAC PT "6 Clicks" Mobility  Outcome Measure Help needed turning from your back to your side while in a flat bed without using bedrails?: A Little Help needed moving from lying on your back to sitting on the side of a flat bed without using bedrails?: A Little Help needed moving to and from a bed to a chair (including a wheelchair)?: A Little Help needed standing up from a chair  using your arms (e.g., wheelchair or bedside chair)?: A Little Help needed to walk in hospital room?: A Little Help needed climbing 3-5 steps with a railing? : A Little 6 Click Score: 18    End of Session   Activity Tolerance: Other (comment)(limited by nausea and dizziness. ) Patient left: in bed;with call bell/phone within reach Nurse Communication: Mobility status;Other (comment)(needs HH therapy and RW at discharge. ) PT Visit Diagnosis: Dizziness and giddiness  (R42);Difficulty in walking, not elsewhere classified (R26.2)    Time: 5825-1898 PT Time Calculation (min) (ACUTE ONLY): 94 min   Charges:        Wells Guiles B. Aurea Aronov, PT, DPT  Acute Rehabilitation [phone] pager [phone]) [phone] office  @ [facility]: [phone]   PT Evaluation $PT Eval Moderate Complexity: 1 Mod(2 units eval and testing.) PT Treatments $Gait Training: 8-22 mins $Self Care/Home Management: 8-22 $Canalith Rep Proc: 8-22 mins(spent 30 mins, but could only charge one of these)        09/18/2018, 11:57 AM

## 2018-09-18 NOTE — Plan of Care (Signed)
  Problem: Education: Goal: Knowledge of General Education information will improve Description: Including pain rating scale, medication(s)/side effects and non-pharmacologic comfort measures Outcome: Adequate for Discharge   Problem: Nutrition: Goal: Adequate nutrition will be maintained Outcome: Adequate for Discharge   Problem: Safety: Goal: Ability to remain free from injury will improve Outcome: Adequate for Discharge   Problem: Acute Rehab PT Goals(only PT should resolve) Goal: Pt Will Go Supine/Side To Sit Outcome: Adequate for Discharge Goal: Pt Will Go Sit To Supine/Side Outcome: Adequate for Discharge Goal: Patient Will Transfer Sit To/From Stand Outcome: Adequate for Discharge Goal: Pt Will Ambulate Outcome: Adequate for Discharge Goal: Pt Will Go Up/Down Stairs Outcome: Adequate for Discharge

## 2018-09-18 NOTE — Telephone Encounter (Signed)
Pt admitted to [facility].

## 2018-09-18 NOTE — Discharge Summary (Signed)
Physician Discharge Summary   Patient ID: PARNIKA TWETEN MRN: [id number] DOB/AGE: December 01, 1932 85 y.o.  Admit date: 09/17/2018 Discharge date: 09/18/2018  Primary Care Physician:  Burnis Medin, MD   Recommendations for Outpatient Follow-up:  1. Follow up with PCP in 1-2 weeks  Home Health: Home health PT for vestibular rehab Equipment/Devices: Rolling walker  Discharge Condition: stable  CODE STATUS: FULL  Diet recommendation:    Discharge Diagnoses:   Vertigo secondary to benign paroxysmal positional vertigo Paroxysmal atrial fibrillation Sinusitis   Consults: None   Allergies:   Allergies  Allergen Reactions  . Alendronate Sodium     upset stomach  . Penicillins     Has patient had a PCN reaction causing immediate rash, facial/tongue/throat swelling, SOB or lightheadedness with hypotension: Yes Has patient had a PCN reaction causing severe rash involving mucus membranes or skin necrosis: No Has patient had a PCN reaction that required hospitalization: No Has patient had a PCN reaction occurring within the last 10 years: No If all of the above answers are "NO", then may proceed with Cephalosporin use.   Marland Kitchen Amoxicillin Rash    Has patient had a PCN reaction causing immediate rash, facial/tongue/throat swelling, SOB or lightheadedness with hypotension: yes Has patient had a PCN reaction causing severe rash involving mucus membranes or skin necrosis: no Has patient had a PCN reaction that required hospitalization: no Has patient had PCN reaction within the last 10 years: no  If all of the above answers are "NO", then may proceed with Cephalosporin use.  REACTION: unspecified  . Metronidazole Other (See Comments)    Pounding headaches patient says was bad.  With cipro for diverticulitis rx. Other reaction(s): Other (See Comments) Pounding headaches patient says was bad.With cipro for diverticulitis rx.     DISCHARGE MEDICATIONS: Allergies as of 09/18/2018    Reactions   Alendronate Sodium    upset stomach   Penicillins    Has patient had a PCN reaction causing immediate rash, facial/tongue/throat swelling, SOB or lightheadedness with hypotension: Yes Has patient had a PCN reaction causing severe rash involving mucus membranes or skin necrosis: No Has patient had a PCN reaction that required hospitalization: No Has patient had a PCN reaction occurring within the last 10 years: No If all of the above answers are "NO", then may proceed with Cephalosporin use.   Amoxicillin Rash   Has patient had a PCN reaction causing immediate rash, facial/tongue/throat swelling, SOB or lightheadedness with hypotension: yes Has patient had a PCN reaction causing severe rash involving mucus membranes or skin necrosis: no Has patient had a PCN reaction that required hospitalization: no Has patient had PCN reaction within the last 10 years: no  If all of the above answers are "NO", then may proceed with Cephalosporin use. REACTION: unspecified   Metronidazole Other (See Comments)   Pounding headaches patient says was bad.  With cipro for diverticulitis rx. Other reaction(s): Other (See Comments) Pounding headaches patient says was bad.With cipro for diverticulitis rx.      Medication List    TAKE these medications   acetaminophen 500 MG tablet Commonly known as: TYLENOL Take 500 mg by mouth every 6 (six) hours as needed for mild pain, moderate pain or headache.   Align 4 MG Caps Take 1 capsule by mouth daily.   AMBULATORY NON FORMULARY MEDICATION Medication Name: Diltiazem 2% with lidocaine 5% Insert a pea size amount into rectum to the first knuckle TID   calcium carbonate 1250 (500 Ca)  MG chewable tablet Commonly known as: OS-CAL Chew 1 tablet by mouth 2 (two) times daily.   conjugated estrogens vaginal cream Commonly known as: PREMARIN Place 1 Applicatorful vaginally daily as needed (dryness).   diltiazem 120 MG 24 hr capsule Commonly known  as: CARDIZEM CD TAKE 1 CAPSULE BY MOUTH EVERY DAY   Eliquis 5 MG Tabs tablet Generic drug: apixaban TAKE 1 TABLET BY MOUTH TWICE A DAY   fluticasone 50 MCG/ACT nasal spray Commonly known as: FLONASE Place 2 sprays into both nostrils daily.   guaiFENesin 600 MG 12 hr tablet Commonly known as: MUCINEX Take 600 mg by mouth 2 (two) times daily as needed (allergies).   loratadine 10 MG tablet Commonly known as: CLARITIN Take 10 mg by mouth daily as needed for allergies.   magnesium hydroxide 400 MG/5ML suspension Commonly known as: MILK OF MAGNESIA Take 15 mLs by mouth daily as needed for mild constipation.   meclizine 25 MG tablet Commonly known as: ANTIVERT Take 1 tablet (25 mg total) by mouth 3 (three) times daily as needed for dizziness.   MULTIPLE VITAMIN PO Take 1 tablet by mouth daily.   ondansetron 4 MG disintegrating tablet Commonly known as: Zofran ODT Take 1 tablet (4 mg total) by mouth every 8 (eight) hours as needed for nausea or vomiting.   polyethylene glycol 17 g packet Commonly known as: MIRALAX / GLYCOLAX Take 17 g by mouth daily.   Restasis 0.05 % ophthalmic emulsion Generic drug: cycloSPORINE Place 1 drop into both eyes 2 (two) times daily.   vitamin C 100 MG tablet Take 100 mg by mouth daily.            Durable Medical Equipment  (From admission, onward)         Start     Ordered   09/18/18 1229  For home use only DME Walker rolling  Once    Question:  Patient needs a walker to treat with the following condition  Answer:  BPPV (benign paroxysmal positional vertigo)   09/18/18 1228           Brief H and P: For complete details please refer to admission H and P, but in brief  Patient is a 85 year old female with lumbar DJD, compression fracture, diverticulosis, GERD, hepatic steatosis, hyperlipidemia, IBS, paroxysmal A. fib, osteoporosis presented for evaluation for dizziness.  Patient reported 3-4 episodes of dizziness, not associated  with nausea or headache, not sure if it is associated with head movement. MRI brain negative for acute stroke.   Hospital Course:   Dizziness likely due to BPPV -MRI brain negative for acute stroke -Patient received IV fluid hydration, orthostatics negative, no leukocytosis, UA negative for infection -COVID-19 test in process -PT, OT for vestibular evaluation done, patient could not tolerate after second treatment, felt nauseous and required Zofran.   PT evaluation recommended home health PT for vestibular rehab and rolling walker A few hours later after the PT evaluation, patient felt a lot better and requested to go home.  Case management was consulted for home health PT.     Abdominal pain -Vague history, patient does not complain of any abdominal pain during exam, no nausea or vomiting.  CT abdomen showed punctate nonobstructing left renal stone and no other acute abnormality. UA negative for UTI    AF (paroxysmal atrial fibrillation) (HCC)  normal sinus rhythm, continue Cardizem Continue home Eliquis  Sinusitis Continue Flonase, Claritin daily   Day of Discharge S: Now feels better, wants to  go home, no nausea or vomiting.  BP (!) 145/76 (BP Location: Left Arm)   Pulse 86   Temp 98.3 F (36.8 C) (Oral)   Resp 18   Ht 5' 2"  (1.575 m)   Wt 74.1 kg   SpO2 100%   BMI 29.88 kg/m   Physical Exam: General: Alert and awake oriented x3 not in any acute distress. HEENT: anicteric sclera, pupils reactive to light and accommodation CVS: S1-S2 clear no murmur rubs or gallops Chest: clear to auscultation bilaterally, no wheezing rales or rhonchi Abdomen: soft nontender, nondistended, normal bowel sounds Extremities: no cyanosis, clubbing or edema noted bilaterally Neuro: Cranial nerves II-XII intact, no focal neurological deficits   The results of significant diagnostics from this hospitalization (including imaging, microbiology, ancillary and laboratory) are listed  below for reference.      Procedures/Studies:  Ct Head Wo Contrast  Result Date: 09/17/2018 CLINICAL DATA:  85 year old with ataxia. Dizziness. EXAM: CT HEAD WITHOUT CONTRAST TECHNIQUE: Contiguous axial images were obtained from the base of the skull through the vertex without intravenous contrast. COMPARISON:  Head CT 05/01/2017 FINDINGS: Brain: No intracranial hemorrhage, mass effect, or midline shift. Stable degree of atrophy and chronic small vessel ischemia. Remote lacunar infarct versus prominent perivascular space in the left insula. No hydrocephalus. The basilar cisterns are patent. No evidence of territorial infarct or acute ischemia. No extra-axial or intracranial fluid collection. Vascular: Atherosclerosis of skullbase vasculature without hyperdense vessel or abnormal calcification. Skull: No fracture or focal lesion. Sinuses/Orbits: Paranasal sinuses and mastoid air cells are clear. The visualized orbits are unremarkable. Bilateral cataract resection. Other: None. IMPRESSION: 1. No acute intracranial abnormality. 2. Stable atrophy and chronic small vessel ischemia. Electronically Signed   By: Keith Rake M.D.   On: 09/17/2018 19:01   Mr Brain Wo Contrast  Result Date: 09/17/2018 CLINICAL DATA:  85 y/o  F; Vertigo, episodic, peripheral. EXAM: MRI HEAD WITHOUT CONTRAST TECHNIQUE: Multiplanar, multiecho pulse sequences of the brain and surrounding structures were obtained without intravenous contrast. COMPARISON:  09/17/2018 CT head FINDINGS: Brain: No acute infarction, hemorrhage, hydrocephalus, extra-axial collection or mass lesion. Early confluent nonspecific T2 FLAIR hyperintensities in subcortical and periventricular white matter are compatible with moderate chronic microvascular ischemic changes. Moderate volume loss of the brain. Vascular: Normal flow voids. Skull and upper cervical spine: Normal marrow signal. Sinuses/Orbits: Negative. Other: Bilateral intra-ocular lens replacement.  IMPRESSION: 1. No acute intracranial abnormality identified. 2. Moderate chronic microvascular ischemic changes and volume loss of the brain. Electronically Signed   By: Kristine Garbe M.D.   On: 09/17/2018 21:57   Ct Abdomen Pelvis W Contrast  Result Date: 09/17/2018 CLINICAL DATA:  Abdominal pain and diarrhea. Dizziness. EXAM: CT ABDOMEN AND PELVIS WITH CONTRAST TECHNIQUE: Multidetector CT imaging of the abdomen and pelvis was performed using the standard protocol following bolus administration of intravenous contrast. CONTRAST:  153m OMNIPAQUE IOHEXOL 300 MG/ML  SOLN COMPARISON:  Noncontrast CT 04/08/2018 FINDINGS: Lower chest: Lung bases are clear. No pleural fluid or consolidation. Minimal subpleural nodularity in the right lower lobe is unchanged from prior exam likely scarring. Hepatobiliary: Calcified granuloma. No focal lesion. Gallbladder physiologically distended, no calcified stone. No biliary dilatation. Pancreas: No ductal dilatation or inflammation. Spleen: Normal in size without focal abnormality. Adrenals/Urinary Tract: Normal adrenal glands. No hydronephrosis or perinephric edema. Homogeneous renal enhancement with symmetric excretion on delayed phase imaging. Subcentimeter low-density lesions in the both kidneys are too small to accurately characterize but likely small cysts. Possible punctate nonobstructing lower left renal  stone. Urinary bladder is physiologically distended without wall thickening. Stomach/Bowel: Colonic diverticulosis without diverticulitis. No colonic wall thickening or inflammation. No liquid stool in the colon. No small bowel obstruction, inflammation, or evident wall thickening. Appendix not confidently visualized, no pericecal inflammation to suggest appendicitis. Stomach is decompressed. Vascular/Lymphatic: Aortic atherosclerosis without aneurysm. Portal vein and mesenteric vessels are patent. No adenopathy. Reproductive: Post hysterectomy. Ovaries  tentatively identified and quiescent. No adnexal mass. Other: No free air, free fluid, or intra-abdominal fluid collection. Tiny fat containing umbilical hernia. Musculoskeletal: Degenerative change in the spine with vacuum phenomena. There are no acute or suspicious osseous abnormalities. IMPRESSION: 1. No acute abnormality or explanation for abdominal pain. 2. Colonic diverticulosis without diverticulitis. No bowel or colonic inflammation. 3. Punctate nonobstructing left renal stone. 4.  Aortic Atherosclerosis (ICD10-I70.0). Electronically Signed   By: Keith Rake M.D.   On: 09/17/2018 19:08       LAB RESULTS: Basic Metabolic Panel: Recent Labs  Lab 09/17/18 1627  NA 134*  K 4.1  CL 100  CO2 23  GLUCOSE 150*  BUN 9  CREATININE 1.05*  CALCIUM 9.6   Liver Function Tests: No results for input(s): AST, ALT, ALKPHOS, BILITOT, PROT, ALBUMIN in the last 168 hours. No results for input(s): LIPASE, AMYLASE in the last 168 hours. No results for input(s): AMMONIA in the last 168 hours. CBC: Recent Labs  Lab 09/17/18 1627  WBC 8.0  HGB 13.9  HCT 42.5  MCV 96.2  PLT 247   Cardiac Enzymes: No results for input(s): CKTOTAL, CKMB, CKMBINDEX, TROPONINI in the last 168 hours. BNP: Invalid input(s): POCBNP CBG: Recent Labs  Lab 09/17/18 1655  GLUCAP 133*      Disposition and Follow-up: Discharge Instructions    Diet - low sodium heart healthy   Complete by: As directed    Discharge instructions   Complete by: As directed    Please take meclizine twice a day for 3 days, then as needed for dizziness/vertigo   Increase activity slowly   Complete by: As directed        DISPOSITION: Home   DISCHARGE FOLLOW-UP Follow-up Information    Panosh, Standley Brooking, MD. Schedule an appointment as soon as possible for a visit in 2 week(s).   Specialties: Internal Medicine, Pediatrics Contact information: [place] [address] [phone]         Burnell Blanks, MD .   Specialty: Cardiology Contact information: [address] [place] [address] [phone]            Time coordinating discharge:  35-minutes  Signed:   Estill Cotta M.D. [facility] 09/18/2018, 2:38 PM

## 2018-09-18 NOTE — Progress Notes (Signed)
Spoke with Dr. Tana Coast and she is ok with patient going home now that the patient is comfortable with going home. Will proceed with the discharge process.

## 2018-09-18 NOTE — Progress Notes (Signed)
Discharge teaching complete. Meds, diet, activity, follow up appointments reviewed and all questions answered. Copy of instructions given to patient and prescriptions sent to pharmacy. Patient discharged home via wheelchair with daughter.

## 2018-09-18 NOTE — Progress Notes (Signed)
[facility]                                                                              Patient Demographics  Ashley Lawson, is a 85 y.o. female, DOB - May 27, 1932, IAX:[id number]  Admit date - 09/17/2018   Admitting Physician Shela Leff, MD  Outpatient Primary MD for the patient is Panosh, Standley Brooking, MD  Outpatient specialists:   LOS - 0  days   Medical records reviewed and are as summarized below:    Chief Complaint  Patient presents with  . Dizziness       Brief summary   Patient is a 85 year old female with lumbar DJD, compression fracture, diverticulosis, GERD, hepatic steatosis, hyperlipidemia, IBS, paroxysmal A. fib, osteoporosis presented for evaluation for dizziness.  Patient reported 3-4 episodes of dizziness, not associated with nausea or headache, not sure if it is associated with head movement. MRI brain negative for acute stroke.   Assessment & Plan    Principal Problem:   Dizziness likely due to BPPV -MRI brain negative for acute stroke -Patient received IV fluid hydration, orthostatics negative, no leukocytosis, UA negative for infection -COVID-19 test in process -PT, OT for vestibular evaluation done, patient could not tolerate the treatments, felt extremely nauseous, needed IV Zofran mid evaluation -Discussed with patient, does not feel safe to go home while feeling dizzy and nauseous.  Also discussed with PT, will need home health PT with walker for safety. -Will give 1 dose of meclizine 25 mg  Active Problems:   Abdominal pain -Vague history, patient does not complain of any abdominal pain during exam, no nausea or vomiting.  CT abdomen showed punctate nonobstructing left renal stone and no other acute abnormality. UA negative for UTI    AF (paroxysmal atrial fibrillation) (HCC)  normal sinus rhythm, continue Cardizem Continue home Eliquis  Sinusitis Continue Flonase, Claritin daily  Code Status: Full CODE STATUS DVT  Prophylaxis: Eliquis Family Communication: Discussed in detail with the patient, all imaging results, lab results explained to the patient    Disposition Plan: Patient does not feel safe going home today, still feeling dizzy and nauseous  Time Spent in minutes   35 minutes  Procedures:  MRI brain  Consultants:   None  Antimicrobials:   Anti-infectives (From admission, onward)   None          Medications  Scheduled Meds: . apixaban  5 mg Oral BID  . diltiazem  120 mg Oral Daily   Continuous Infusions: PRN Meds:.acetaminophen **OR** acetaminophen, meclizine, ondansetron (ZOFRAN) IV      Subjective:   [name] was seen and examined today.  At the time of examination, patient had felt better, no acute dizziness, lightheadedness, chest pain or palpitations.  During PT evaluation and Epley's maneuvers, felt very nauseous and needed IV Zofran.  Patient denies chest pain, shortness of breath, abdominal pain, diarrhea or constipation.   Objective:   Vitals:   09/18/18 0345 09/18/18 0500 09/18/18 0530 09/18/18 0644  BP: 120/85 120/70    Pulse: 81  80   Resp: 18 17 16    Temp:      TempSrc:  SpO2: 92%  96%   Weight:    74.1 kg  Height:    5' 2"  (1.575 m)    Intake/Output Summary (Last 24 hours) at 09/18/2018 1249 Last data filed at 09/17/2018 1959 Gross per 24 hour  Intake 495 ml  Output -  Net 495 ml     Wt Readings from Last 3 Encounters:  09/18/18 74.1 kg  05/15/18 74.3 kg  05/08/18 74.1 kg     Exam  General: Alert and oriented x 3, NAD  Eyes:   HEENT:    Cardiovascular: S1 S2 auscultated Regular rate and rhythm.  Respiratory: Clear to auscultation bilaterally, no wheezing, rales or rhonchi  Gastrointestinal: Soft, nontender, nondistended, + bowel sounds  Ext: no pedal edema bilaterally  Neuro: No new deficits, strength 5/5 upper and lower extremities bilaterally, no dysarthria  Musculoskeletal: No digital cyanosis,  clubbing  Skin: No rashes  Psych: Normal affect and demeanor, alert and oriented x3    Data Reviewed:  I have personally reviewed following labs and imaging studies  Micro Results No results found for this or any previous visit (from the past 240 hour(s)).  Radiology Reports Ct Head Wo Contrast  Result Date: 09/17/2018 CLINICAL DATA:  85 year old with ataxia. Dizziness. EXAM: CT HEAD WITHOUT CONTRAST TECHNIQUE: Contiguous axial images were obtained from the base of the skull through the vertex without intravenous contrast. COMPARISON:  Head CT 05/01/2017 FINDINGS: Brain: No intracranial hemorrhage, mass effect, or midline shift. Stable degree of atrophy and chronic small vessel ischemia. Remote lacunar infarct versus prominent perivascular space in the left insula. No hydrocephalus. The basilar cisterns are patent. No evidence of territorial infarct or acute ischemia. No extra-axial or intracranial fluid collection. Vascular: Atherosclerosis of skullbase vasculature without hyperdense vessel or abnormal calcification. Skull: No fracture or focal lesion. Sinuses/Orbits: Paranasal sinuses and mastoid air cells are clear. The visualized orbits are unremarkable. Bilateral cataract resection. Other: None. IMPRESSION: 1. No acute intracranial abnormality. 2. Stable atrophy and chronic small vessel ischemia. Electronically Signed   By: Keith Rake M.D.   On: 09/17/2018 19:01   Mr Brain Wo Contrast  Result Date: 09/17/2018 CLINICAL DATA:  85 y/o  F; Vertigo, episodic, peripheral. EXAM: MRI HEAD WITHOUT CONTRAST TECHNIQUE: Multiplanar, multiecho pulse sequences of the brain and surrounding structures were obtained without intravenous contrast. COMPARISON:  09/17/2018 CT head FINDINGS: Brain: No acute infarction, hemorrhage, hydrocephalus, extra-axial collection or mass lesion. Early confluent nonspecific T2 FLAIR hyperintensities in subcortical and periventricular white matter are compatible with  moderate chronic microvascular ischemic changes. Moderate volume loss of the brain. Vascular: Normal flow voids. Skull and upper cervical spine: Normal marrow signal. Sinuses/Orbits: Negative. Other: Bilateral intra-ocular lens replacement. IMPRESSION: 1. No acute intracranial abnormality identified. 2. Moderate chronic microvascular ischemic changes and volume loss of the brain. Electronically Signed   By: Kristine Garbe M.D.   On: 09/17/2018 21:57   Ct Abdomen Pelvis W Contrast  Result Date: 09/17/2018 CLINICAL DATA:  Abdominal pain and diarrhea. Dizziness. EXAM: CT ABDOMEN AND PELVIS WITH CONTRAST TECHNIQUE: Multidetector CT imaging of the abdomen and pelvis was performed using the standard protocol following bolus administration of intravenous contrast. CONTRAST:  129m OMNIPAQUE IOHEXOL 300 MG/ML  SOLN COMPARISON:  Noncontrast CT 04/08/2018 FINDINGS: Lower chest: Lung bases are clear. No pleural fluid or consolidation. Minimal subpleural nodularity in the right lower lobe is unchanged from prior exam likely scarring. Hepatobiliary: Calcified granuloma. No focal lesion. Gallbladder physiologically distended, no calcified stone. No biliary dilatation. Pancreas: No ductal  dilatation or inflammation. Spleen: Normal in size without focal abnormality. Adrenals/Urinary Tract: Normal adrenal glands. No hydronephrosis or perinephric edema. Homogeneous renal enhancement with symmetric excretion on delayed phase imaging. Subcentimeter low-density lesions in the both kidneys are too small to accurately characterize but likely small cysts. Possible punctate nonobstructing lower left renal stone. Urinary bladder is physiologically distended without wall thickening. Stomach/Bowel: Colonic diverticulosis without diverticulitis. No colonic wall thickening or inflammation. No liquid stool in the colon. No small bowel obstruction, inflammation, or evident wall thickening. Appendix not confidently visualized, no  pericecal inflammation to suggest appendicitis. Stomach is decompressed. Vascular/Lymphatic: Aortic atherosclerosis without aneurysm. Portal vein and mesenteric vessels are patent. No adenopathy. Reproductive: Post hysterectomy. Ovaries tentatively identified and quiescent. No adnexal mass. Other: No free air, free fluid, or intra-abdominal fluid collection. Tiny fat containing umbilical hernia. Musculoskeletal: Degenerative change in the spine with vacuum phenomena. There are no acute or suspicious osseous abnormalities. IMPRESSION: 1. No acute abnormality or explanation for abdominal pain. 2. Colonic diverticulosis without diverticulitis. No bowel or colonic inflammation. 3. Punctate nonobstructing left renal stone. 4.  Aortic Atherosclerosis (ICD10-I70.0). Electronically Signed   By: Keith Rake M.D.   On: 09/17/2018 19:08    Lab Data:  CBC: Recent Labs  Lab 09/17/18 1627  WBC 8.0  HGB 13.9  HCT 42.5  MCV 96.2  PLT 505   Basic Metabolic Panel: Recent Labs  Lab 09/17/18 1627  NA 134*  K 4.1  CL 100  CO2 23  GLUCOSE 150*  BUN 9  CREATININE 1.05*  CALCIUM 9.6   GFR: Estimated Creatinine Clearance: 37.6 mL/min (A) (by C-G formula based on SCr of 1.05 mg/dL (H)). Liver Function Tests: No results for input(s): AST, ALT, ALKPHOS, BILITOT, PROT, ALBUMIN in the last 168 hours. No results for input(s): LIPASE, AMYLASE in the last 168 hours. No results for input(s): AMMONIA in the last 168 hours. Coagulation Profile: No results for input(s): INR, PROTIME in the last 168 hours. Cardiac Enzymes: No results for input(s): CKTOTAL, CKMB, CKMBINDEX, TROPONINI in the last 168 hours. BNP (last 3 results) No results for input(s): PROBNP in the last 8760 hours. HbA1C: No results for input(s): HGBA1C in the last 72 hours. CBG: Recent Labs  Lab 09/17/18 1655  GLUCAP 133*   Lipid Profile: No results for input(s): CHOL, HDL, LDLCALC, TRIG, CHOLHDL, LDLDIRECT in the last 72  hours. Thyroid Function Tests: No results for input(s): TSH, T4TOTAL, FREET4, T3FREE, THYROIDAB in the last 72 hours. Anemia Panel: No results for input(s): VITAMINB12, FOLATE, FERRITIN, TIBC, IRON, RETICCTPCT in the last 72 hours. Urine analysis:    Component Value Date/Time   COLORURINE AMBER (A) 09/17/2018 1700   APPEARANCEUR HAZY (A) 09/17/2018 1700   LABSPEC 1.027 09/17/2018 1700   PHURINE 5.0 09/17/2018 1700   GLUCOSEU NEGATIVE 09/17/2018 1700   HGBUR NEGATIVE 09/17/2018 1700   HGBUR 2+ 08/01/2009 1508   BILIRUBINUR NEGATIVE 09/17/2018 1700   BILIRUBINUR negative 04/28/2018 1404   KETONESUR NEGATIVE 09/17/2018 1700   PROTEINUR 30 (A) 09/17/2018 1700   UROBILINOGEN 0.2 04/28/2018 1404   UROBILINOGEN 0.2 01/08/2010 1831   NITRITE NEGATIVE 09/17/2018 1700   LEUKOCYTESUR NEGATIVE 09/17/2018 1700     Tiney Zipper M.D. [facility] 09/18/2018, 12:49 PM  Pager: [phone] Between 7am to 7pm - call Pager - [phone]  After 7pm go to [email or web address] - password [email or web address]  Call night coverage person covering after 7pm

## 2018-09-19 LAB — NOVEL CORONAVIRUS, NAA (HOSP ORDER, SEND-OUT TO REF LAB; TAT 18-24 HRS): SARS-CoV-2, NAA: NOT DETECTED

## 2018-09-21 ENCOUNTER — Telehealth: Payer: Self-pay | Admitting: *Deleted

## 2018-09-21 NOTE — Telephone Encounter (Signed)
Transition Care Management Follow-up Telephone Call   Date discharged? 09/18/2018   How have you been since you were released from the hospital? I'm doing pretty good    Do you understand why you were in the hospital? yes   Do you understand the discharge instructions? yes   Where were you discharged to? Home    Items Reviewed:  Medications reviewed: yes  Allergies reviewed: yes  Dietary changes reviewed: N/A  Referrals reviewed: N/A   Functional Questionnaire:   Activities of Daily Living (ADLs):   She states they are independent in the following: ambulation, bathing and hygiene, feeding, continence, grooming, toileting and dressing States they require assistance with the following: N/A    Any transportation issues/concerns?: no   Any patient concerns? no   Confirmed importance and date/time of follow-up visits scheduled yes  Provider Appointment booked with Dr. Regis Bill for 09/23/2018 at 2:30 PM   Confirmed with patient if condition begins to worsen call PCP or go to the ER.  Patient was given the office number and encouraged to call back with question or concerns.  : yes

## 2018-09-23 ENCOUNTER — Other Ambulatory Visit: Payer: Self-pay

## 2018-09-23 ENCOUNTER — Encounter: Payer: Self-pay | Admitting: Internal Medicine

## 2018-09-23 ENCOUNTER — Ambulatory Visit (INDEPENDENT_AMBULATORY_CARE_PROVIDER_SITE_OTHER): Payer: Medicare Other | Admitting: Internal Medicine

## 2018-09-23 DIAGNOSIS — F419 Anxiety disorder, unspecified: Secondary | ICD-10-CM

## 2018-09-23 DIAGNOSIS — Z09 Encounter for follow-up examination after completed treatment for conditions other than malignant neoplasm: Secondary | ICD-10-CM

## 2018-09-23 DIAGNOSIS — R42 Dizziness and giddiness: Secondary | ICD-10-CM

## 2018-09-23 DIAGNOSIS — R531 Weakness: Secondary | ICD-10-CM | POA: Diagnosis not present

## 2018-09-23 MED ORDER — LORAZEPAM 0.5 MG PO TABS: 0.5000 mg | ORAL_TABLET | Freq: Two times a day (BID) | ORAL | 0 refills | Status: DC | PRN

## 2018-09-23 NOTE — Progress Notes (Signed)
Virtual Visit via Video Note  I connected with@ on 09/23/18 at  2:30 PM EDT by a video   enabled telemedicine application and verified that I am speaking with the correct person using two identifiers. Location patient: home Location provider:work or home office Persons participating in the virtual visit: patient, provider and daughter  [phone]  [place] national recommendations  regarding COVID 19 pandemic   video visit is advised over in office visit for this patient.  Patient aware  of the limitations of evaluation and management by telemedicine and  availability of in person appointments. and agreed to proceed.   HPI: Ashley Lawson presents for video visit  Post hospitalization for acute dizziness  . [facility] and weak feeling   Was admited to hosp for evaluation and   Final dx was cw peripherap vertigo     Is better that  Before but still weak feeling and head is tight  ? Anxiety . Hard to sleep . Taking liquidsa ok   Daughter is with her today     See below advise   Supposed to fu with cards at some point .     Admit date: 09/17/2018 Discharge date: 09/18/2018  Primary Care Physician:  Burnis Medin, MD  Recommendations for Outpatient Follow-up:  1. Follow up with PCP in 1-2 weeks  Home Health: Home health PT for vestibular rehab Equipment/Devices: Rolling walker  Discharge Condition: stable  CODE STATUS: FULL  Diet recommendation:    Discharge Diagnoses:   Vertigo secondary to benign paroxysmal positional vertigo Paroxysmal atrial fibrillation Sinusitis  Dizzinesslikely due to BPPV -MRI brain negative for acute stroke -Patient received IV fluid hydration, orthostatics negative, no leukocytosis, UA negative for infection -COVID-19 test in process -PT, OT for vestibular evaluation done, patient could not tolerate after second treatment, felt nauseous and required Zofran.   PT evaluation recommended home health PT for  vestibular rehab and rolling walker A few hours later after the PT evaluation, patient felt a lot better and requested to go home.  Case management was consulted for home health PT.  ROS: See pertinent positives and negatives per HPI.  Past Medical History:  Diagnosis Date  . Abdominal pain 12/2009   hospitalized  . Allergic rhinitis   . Chronic constipation   . Compression fx, lumbar spine ([place])   . DDD (degenerative disc disease), lumbar   . Diverticulosis of colon   . Dog bite(E906.0) 08/30/2011  . GERD (gastroesophageal reflux disease)   . Hepatic steatosis   . Hyperlipidemia   . Hyperplastic colon polyp   . IBS (irritable bowel syndrome)    constipation predominant  . Internal hemorrhoids   . Osteoporosis    dexa 2011 -2.7 hip nl spine  . PAF (paroxysmal atrial fibrillation) ([place])    a. dx 05/2017.  Marland Kitchen PMR (polymyalgia rheumatica) (HCC) 08/30/2011   under rheum care  on low dose pred 5 mg   . Renal disorder     Past Surgical History:  Procedure Laterality Date  . ABDOMINAL HYSTERECTOMY    . CATARACT EXTRACTION  2010   both  . COLONOSCOPY  2016 was most recent    Family History  Problem Relation Age of Onset  . Other Mother        fx hip  . Other Sister        fx hip  . Bladder Cancer Brother   . Colon cancer Neg Hx   . Esophageal  cancer Neg Hx   . Stomach cancer Neg Hx   . Rectal cancer Neg Hx   . Liver cancer Neg Hx     Social History   Tobacco Use  . Smoking status: Never Smoker  . Smokeless tobacco: Never Used  Substance Use Topics  . Alcohol use: Yes    Alcohol/week: 0.0 standard drinks    Comment: occ  . Drug use: No      Current Outpatient Medications:  .  acetaminophen (TYLENOL) 500 MG tablet, Take 500 mg by mouth every 6 (six) hours as needed for mild pain, moderate pain or headache., Disp: , Rfl:  .  AMBULATORY NON FORMULARY MEDICATION, Medication Name: Diltiazem 2% with lidocaine 5% Insert a pea size amount into rectum to the first  knuckle TID, Disp: 30 g, Rfl: 3 .  Ascorbic Acid (VITAMIN C) 100 MG tablet, Take 100 mg by mouth daily.  , Disp: , Rfl:  .  calcium carbonate (OS-CAL) 1250 (500 Ca) MG chewable tablet, Chew 1 tablet by mouth 2 (two) times daily., Disp: , Rfl:  .  conjugated estrogens (PREMARIN) vaginal cream, Place 1 Applicatorful vaginally daily as needed (dryness)., Disp: , Rfl:  .  diltiazem (CARDIZEM CD) 120 MG 24 hr capsule, TAKE 1 CAPSULE BY MOUTH EVERY DAY, Disp: 90 capsule, Rfl: 0 .  ELIQUIS 5 MG TABS tablet, TAKE 1 TABLET BY MOUTH TWICE A DAY, Disp: 180 tablet, Rfl: 1 .  fluticasone (FLONASE) 50 MCG/ACT nasal spray, Place 2 sprays into both nostrils daily., Disp: , Rfl:  .  guaiFENesin (MUCINEX) 600 MG 12 hr tablet, Take 600 mg by mouth 2 (two) times daily as needed (allergies)., Disp: , Rfl:  .  loratadine (CLARITIN) 10 MG tablet, Take 10 mg by mouth daily as needed for allergies., Disp: , Rfl:  .  LORazepam (ATIVAN) 0.5 MG tablet, Take 1-2 tablets (0.5-1 mg total) by mouth 2 (two) times daily as needed for anxiety. Avoid regular use ,sedation precautions, Disp: 20 tablet, Rfl: 0 .  magnesium hydroxide (MILK OF MAGNESIA) 400 MG/5ML suspension, Take 15 mLs by mouth daily as needed for mild constipation., Disp: , Rfl:  .  meclizine (ANTIVERT) 25 MG tablet, Take 1 tablet (25 mg total) by mouth 3 (three) times daily as needed for dizziness., Disp: 60 tablet, Rfl: 1 .  MULTIPLE VITAMIN PO, Take 1 tablet by mouth daily. , Disp: , Rfl:  .  ondansetron (ZOFRAN ODT) 4 MG disintegrating tablet, Take 1 tablet (4 mg total) by mouth every 8 (eight) hours as needed for nausea or vomiting., Disp: 20 tablet, Rfl: 0 .  polyethylene glycol (MIRALAX / GLYCOLAX) packet, Take 17 g by mouth daily., Disp: , Rfl:  .  Probiotic Product (ALIGN) 4 MG CAPS, Take 1 capsule by mouth daily., Disp: , Rfl:  .  RESTASIS 0.05 % ophthalmic emulsion, Place 1 drop into both eyes 2 (two) times daily., Disp: , Rfl: 3  EXAM: BP Readings from  Last 3 Encounters:  09/18/18 (!) 145/76  05/15/18 122/70  05/08/18 (!) 100/58   Wt Readings from Last 3 Encounters:  09/18/18 163 lb 5.8 oz (74.1 kg)  05/15/18 163 lb 12.8 oz (74.3 kg)  05/08/18 163 lb 6 oz (74.1 kg)    VITALS per patient if applicable:  GENERAL: alert, oriented, appears well and in no acute distress tired and anxious  But cognition nl and speeck nl  HEENT: atraumatic, conjunttiva clear, no obvious abnormalities on inspection of external nose and ears NECK:  normal movements of the head and neck LUNGS: on inspection no signs of respiratory distress, breathing rate appears normal, no obvious gross SOB, gasping or wheezing CV: no obvious cyanosis MS: moves all visible extremities without noticeable abnormality PSYCH/NEURO: pleasant and cooperative, appears mildly anxious and under the weather , speech and thought processing grossly intact Lab Results  Component Value Date   WBC 8.0 09/17/2018   HGB 13.9 09/17/2018   HCT 42.5 09/17/2018   PLT 247 09/17/2018   GLUCOSE 150 (H) 09/17/2018   CHOL 197 04/08/2018   TRIG 139.0 04/08/2018   HDL 63.50 04/08/2018   LDLDIRECT 125.8 02/23/2013   LDLCALC 105 (H) 04/08/2018   ALT 20 04/08/2018   AST 30 04/08/2018   NA 134 (L) 09/17/2018   K 4.1 09/17/2018   CL 100 09/17/2018   CREATININE 1.05 (H) 09/17/2018   BUN 9 09/17/2018   CO2 23 09/17/2018   TSH 3.670 05/28/2017    ASSESSMENT AND PLAN:  Discussed the following assessment and plan:    ICD-10-CM   1. Vertigo  R42 Ambulatory referral to Home Health  2. Hospital discharge follow-up  Z09 Ambulatory referral to [place]  3. Dizziness  R42 Ambulatory referral to Home Health  4. Weakness  R53.1 Ambulatory referral to Home Health  5. Anxiety  F41.9     Counseled.  About anxiety  We can add  lorazepam sparingly with caution to get through he anxiety   Arrange for PT home to help with the weakness feeling  And then  Plan   Fu how doing in a few weeks and go from  there     Expectant management and discussion of plan and treatment with opportunity to ask questions and all were answered. The patient agreed with the plan and demonstrated an understanding of the instructions.   Advised to call back or seek an in-person evaluation if worsening  or having  further concerns .  I provided 35 minutes of non-face-to-face time during this encounter.   Shanon Ace, MD

## 2018-10-02 DIAGNOSIS — M810 Age-related osteoporosis without current pathological fracture: Secondary | ICD-10-CM | POA: Diagnosis not present

## 2018-10-02 DIAGNOSIS — M1990 Unspecified osteoarthritis, unspecified site: Secondary | ICD-10-CM | POA: Diagnosis not present

## 2018-10-02 DIAGNOSIS — Z7901 Long term (current) use of anticoagulants: Secondary | ICD-10-CM | POA: Diagnosis not present

## 2018-10-02 DIAGNOSIS — J309 Allergic rhinitis, unspecified: Secondary | ICD-10-CM | POA: Diagnosis not present

## 2018-10-02 DIAGNOSIS — Z9181 History of falling: Secondary | ICD-10-CM | POA: Diagnosis not present

## 2018-10-02 DIAGNOSIS — E785 Hyperlipidemia, unspecified: Secondary | ICD-10-CM | POA: Diagnosis not present

## 2018-10-02 DIAGNOSIS — I480 Paroxysmal atrial fibrillation: Secondary | ICD-10-CM | POA: Diagnosis not present

## 2018-10-02 DIAGNOSIS — I2510 Atherosclerotic heart disease of native coronary artery without angina pectoris: Secondary | ICD-10-CM | POA: Diagnosis not present

## 2018-10-02 DIAGNOSIS — K5790 Diverticulosis of intestine, part unspecified, without perforation or abscess without bleeding: Secondary | ICD-10-CM | POA: Diagnosis not present

## 2018-10-02 DIAGNOSIS — M5136 Other intervertebral disc degeneration, lumbar region: Secondary | ICD-10-CM | POA: Diagnosis not present

## 2018-10-02 DIAGNOSIS — K589 Irritable bowel syndrome without diarrhea: Secondary | ICD-10-CM | POA: Diagnosis not present

## 2018-10-02 DIAGNOSIS — H8110 Benign paroxysmal vertigo, unspecified ear: Secondary | ICD-10-CM | POA: Diagnosis not present

## 2018-10-02 DIAGNOSIS — M353 Polymyalgia rheumatica: Secondary | ICD-10-CM | POA: Diagnosis not present

## 2018-10-02 DIAGNOSIS — R2689 Other abnormalities of gait and mobility: Secondary | ICD-10-CM | POA: Diagnosis not present

## 2018-10-02 DIAGNOSIS — K219 Gastro-esophageal reflux disease without esophagitis: Secondary | ICD-10-CM | POA: Diagnosis not present

## 2018-10-06 DIAGNOSIS — Z7901 Long term (current) use of anticoagulants: Secondary | ICD-10-CM | POA: Diagnosis not present

## 2018-10-06 DIAGNOSIS — K589 Irritable bowel syndrome without diarrhea: Secondary | ICD-10-CM | POA: Diagnosis not present

## 2018-10-06 DIAGNOSIS — I2510 Atherosclerotic heart disease of native coronary artery without angina pectoris: Secondary | ICD-10-CM | POA: Diagnosis not present

## 2018-10-06 DIAGNOSIS — I480 Paroxysmal atrial fibrillation: Secondary | ICD-10-CM | POA: Diagnosis not present

## 2018-10-06 DIAGNOSIS — M5136 Other intervertebral disc degeneration, lumbar region: Secondary | ICD-10-CM | POA: Diagnosis not present

## 2018-10-06 DIAGNOSIS — M1990 Unspecified osteoarthritis, unspecified site: Secondary | ICD-10-CM | POA: Diagnosis not present

## 2018-10-06 DIAGNOSIS — J309 Allergic rhinitis, unspecified: Secondary | ICD-10-CM | POA: Diagnosis not present

## 2018-10-06 DIAGNOSIS — H8110 Benign paroxysmal vertigo, unspecified ear: Secondary | ICD-10-CM | POA: Diagnosis not present

## 2018-10-06 DIAGNOSIS — K5790 Diverticulosis of intestine, part unspecified, without perforation or abscess without bleeding: Secondary | ICD-10-CM | POA: Diagnosis not present

## 2018-10-06 DIAGNOSIS — R2689 Other abnormalities of gait and mobility: Secondary | ICD-10-CM | POA: Diagnosis not present

## 2018-10-06 DIAGNOSIS — M810 Age-related osteoporosis without current pathological fracture: Secondary | ICD-10-CM | POA: Diagnosis not present

## 2018-10-06 DIAGNOSIS — Z9181 History of falling: Secondary | ICD-10-CM | POA: Diagnosis not present

## 2018-10-06 DIAGNOSIS — M353 Polymyalgia rheumatica: Secondary | ICD-10-CM | POA: Diagnosis not present

## 2018-10-06 DIAGNOSIS — E785 Hyperlipidemia, unspecified: Secondary | ICD-10-CM | POA: Diagnosis not present

## 2018-10-06 DIAGNOSIS — K219 Gastro-esophageal reflux disease without esophagitis: Secondary | ICD-10-CM | POA: Diagnosis not present

## 2018-10-09 DIAGNOSIS — I2510 Atherosclerotic heart disease of native coronary artery without angina pectoris: Secondary | ICD-10-CM | POA: Diagnosis not present

## 2018-10-09 DIAGNOSIS — Z9181 History of falling: Secondary | ICD-10-CM | POA: Diagnosis not present

## 2018-10-09 DIAGNOSIS — M5136 Other intervertebral disc degeneration, lumbar region: Secondary | ICD-10-CM | POA: Diagnosis not present

## 2018-10-09 DIAGNOSIS — M1990 Unspecified osteoarthritis, unspecified site: Secondary | ICD-10-CM | POA: Diagnosis not present

## 2018-10-09 DIAGNOSIS — Z7901 Long term (current) use of anticoagulants: Secondary | ICD-10-CM | POA: Diagnosis not present

## 2018-10-09 DIAGNOSIS — H8110 Benign paroxysmal vertigo, unspecified ear: Secondary | ICD-10-CM | POA: Diagnosis not present

## 2018-10-09 DIAGNOSIS — R2689 Other abnormalities of gait and mobility: Secondary | ICD-10-CM | POA: Diagnosis not present

## 2018-10-09 DIAGNOSIS — M810 Age-related osteoporosis without current pathological fracture: Secondary | ICD-10-CM | POA: Diagnosis not present

## 2018-10-09 DIAGNOSIS — K219 Gastro-esophageal reflux disease without esophagitis: Secondary | ICD-10-CM | POA: Diagnosis not present

## 2018-10-09 DIAGNOSIS — J309 Allergic rhinitis, unspecified: Secondary | ICD-10-CM | POA: Diagnosis not present

## 2018-10-09 DIAGNOSIS — K589 Irritable bowel syndrome without diarrhea: Secondary | ICD-10-CM | POA: Diagnosis not present

## 2018-10-09 DIAGNOSIS — M353 Polymyalgia rheumatica: Secondary | ICD-10-CM | POA: Diagnosis not present

## 2018-10-09 DIAGNOSIS — E785 Hyperlipidemia, unspecified: Secondary | ICD-10-CM | POA: Diagnosis not present

## 2018-10-09 DIAGNOSIS — K5790 Diverticulosis of intestine, part unspecified, without perforation or abscess without bleeding: Secondary | ICD-10-CM | POA: Diagnosis not present

## 2018-10-09 DIAGNOSIS — I480 Paroxysmal atrial fibrillation: Secondary | ICD-10-CM | POA: Diagnosis not present

## 2018-10-13 ENCOUNTER — Telehealth: Payer: Self-pay | Admitting: Internal Medicine

## 2018-10-13 MED ORDER — LORAZEPAM 0.5 MG PO TABS: 0.5000 mg | ORAL_TABLET | Freq: Two times a day (BID) | ORAL | 0 refills | Status: DC | PRN

## 2018-10-13 NOTE — Telephone Encounter (Signed)
Sent in electronically .  

## 2018-10-13 NOTE — Telephone Encounter (Signed)
Refill request: LORazepam (ATIVAN) 0.5 MG tablet [[id number]]   [id number] [place] - [address](Phone) [phone](Fax)

## 2018-10-14 DIAGNOSIS — H8110 Benign paroxysmal vertigo, unspecified ear: Secondary | ICD-10-CM | POA: Diagnosis not present

## 2018-10-14 DIAGNOSIS — Z7901 Long term (current) use of anticoagulants: Secondary | ICD-10-CM | POA: Diagnosis not present

## 2018-10-14 DIAGNOSIS — M1990 Unspecified osteoarthritis, unspecified site: Secondary | ICD-10-CM | POA: Diagnosis not present

## 2018-10-14 DIAGNOSIS — K589 Irritable bowel syndrome without diarrhea: Secondary | ICD-10-CM | POA: Diagnosis not present

## 2018-10-14 DIAGNOSIS — M353 Polymyalgia rheumatica: Secondary | ICD-10-CM | POA: Diagnosis not present

## 2018-10-14 DIAGNOSIS — E785 Hyperlipidemia, unspecified: Secondary | ICD-10-CM | POA: Diagnosis not present

## 2018-10-14 DIAGNOSIS — K5790 Diverticulosis of intestine, part unspecified, without perforation or abscess without bleeding: Secondary | ICD-10-CM | POA: Diagnosis not present

## 2018-10-14 DIAGNOSIS — R2689 Other abnormalities of gait and mobility: Secondary | ICD-10-CM | POA: Diagnosis not present

## 2018-10-14 DIAGNOSIS — I480 Paroxysmal atrial fibrillation: Secondary | ICD-10-CM | POA: Diagnosis not present

## 2018-10-14 DIAGNOSIS — Z9181 History of falling: Secondary | ICD-10-CM | POA: Diagnosis not present

## 2018-10-14 DIAGNOSIS — M810 Age-related osteoporosis without current pathological fracture: Secondary | ICD-10-CM | POA: Diagnosis not present

## 2018-10-14 DIAGNOSIS — M5136 Other intervertebral disc degeneration, lumbar region: Secondary | ICD-10-CM | POA: Diagnosis not present

## 2018-10-14 DIAGNOSIS — K219 Gastro-esophageal reflux disease without esophagitis: Secondary | ICD-10-CM | POA: Diagnosis not present

## 2018-10-14 DIAGNOSIS — J309 Allergic rhinitis, unspecified: Secondary | ICD-10-CM | POA: Diagnosis not present

## 2018-10-14 DIAGNOSIS — I2510 Atherosclerotic heart disease of native coronary artery without angina pectoris: Secondary | ICD-10-CM | POA: Diagnosis not present

## 2018-10-21 ENCOUNTER — Other Ambulatory Visit: Payer: Self-pay

## 2018-10-21 ENCOUNTER — Telehealth: Payer: Self-pay

## 2018-10-21 ENCOUNTER — Ambulatory Visit (INDEPENDENT_AMBULATORY_CARE_PROVIDER_SITE_OTHER): Payer: Medicare Other | Admitting: Cardiology

## 2018-10-21 ENCOUNTER — Encounter: Payer: Self-pay | Admitting: Cardiology

## 2018-10-21 VITALS — BP 138/76 | HR 79 | Ht 62.0 in | Wt 167.0 lb

## 2018-10-21 DIAGNOSIS — I480 Paroxysmal atrial fibrillation: Secondary | ICD-10-CM | POA: Diagnosis not present

## 2018-10-21 NOTE — Patient Instructions (Signed)
Medication Instructions:  none If you need a refill on your cardiac medications before your next appointment, please call your pharmacy.   Lab work: none If you have labs (blood work) drawn today and your tests are completely normal, you will receive your results only by: Marland Kitchen MyChart Message (if you have MyChart) OR . A paper copy in the mail If you have any lab test that is abnormal or we need to change your treatment, we will call you to review the results.  Testing/Procedures: none  Follow-Up: At [facility], you and your health needs are our priority.  As part of our continuing mission to provide you with exceptional heart care, we have created designated Provider Care Teams.  These Care Teams include your primary Cardiologist (physician) and Advanced Practice Providers (APPs -  Physician Assistants and Nurse Practitioners) who all work together to provide you with the care you need, when you need it. You will need a follow up appointment in 6 months.  Please call our office 2 months in advance to schedule this appointment.  You may see Lauree Chandler, MD or one of the following Advanced Practice Providers on your designated Care Team:   [place], PA-C Melina Copa, PA-C . Ermalinda Barrios, PA-C  Any Other Special Instructions Will Be Listed Below (If Applicable).

## 2018-10-21 NOTE — Telephone Encounter (Signed)

## 2018-10-21 NOTE — Progress Notes (Signed)
10/21/2018 Ashley Lawson   1932-05-23  [id number]  Primary Physician Panosh, Standley Brooking, MD Primary Cardiologist: Lauree Chandler, MD  Electrophysiologist: None   Reason for Visit/CC: Post hospital f/u   HPI:  Ashley Lawson is a 85 y.o. female who is being seen today for post hospital f/u. She is followed by Dr. Angelena Form. She has a h/o atrial fibrillation and is on a/c w/ Eliquis. 2D echo 05/02/17 showed EF 56-43%, mild MR, systolic bowing without prolapse, mildly dilated RA. She is on Cardizem for rate control. No known CAD.   She was recently admitted in June for vertigo. She was treated w/ meclizine. EKGs during hospitalization showed NSR. She had no cardiac symptoms but was advised on d/c to f/u in our clinic along with f/u with PCP. I have reviewed hospital records and there were no cardiac issues. Hgb and SCr were WNL.   She has been doing well. No recurrent vertigo. EKG today shows NSR. HR is controlled. BP is controlled. She denies any cardiac symptoms. No abnormal bleeding w/ Eliquis and no falls.   No outpatient medications have been marked as taking for the 10/21/18 encounter (Office Visit) with Consuelo Pandy, PA-C.   Allergies  Allergen Reactions  . Alendronate Sodium     upset stomach  . Penicillins     Has patient had a PCN reaction causing immediate rash, facial/tongue/throat swelling, SOB or lightheadedness with hypotension: Yes Has patient had a PCN reaction causing severe rash involving mucus membranes or skin necrosis: No Has patient had a PCN reaction that required hospitalization: No Has patient had a PCN reaction occurring within the last 10 years: No If all of the above answers are "NO", then may proceed with Cephalosporin use.   Marland Kitchen Amoxicillin Rash    Has patient had a PCN reaction causing immediate rash, facial/tongue/throat swelling, SOB or lightheadedness with hypotension: yes Has patient had a PCN reaction causing severe rash involving mucus  membranes or skin necrosis: no Has patient had a PCN reaction that required hospitalization: no Has patient had PCN reaction within the last 10 years: no  If all of the above answers are "NO", then may proceed with Cephalosporin use.  REACTION: unspecified  . Metronidazole Other (See Comments)    Pounding headaches patient says was bad.  With cipro for diverticulitis rx. Other reaction(s): Other (See Comments) Pounding headaches patient says was bad.With cipro for diverticulitis rx.   Past Medical History:  Diagnosis Date  . Abdominal pain 12/2009   hospitalized  . Allergic rhinitis   . Chronic constipation   . Compression fx, lumbar spine ([place])   . DDD (degenerative disc disease), lumbar   . Diverticulosis of colon   . Dog bite(E906.0) 08/30/2011  . GERD (gastroesophageal reflux disease)   . Hepatic steatosis   . Hyperlipidemia   . Hyperplastic colon polyp   . IBS (irritable bowel syndrome)    constipation predominant  . Internal hemorrhoids   . Osteoporosis    dexa 2011 -2.7 hip nl spine  . PAF (paroxysmal atrial fibrillation) ([place])    a. dx 05/2017.  Marland Kitchen PMR (polymyalgia rheumatica) ([facility]) 08/30/2011   under rheum care  on low dose pred 5 mg   . Renal disorder    Family History  Problem Relation Age of Onset  . Other Mother        fx hip  . Other Sister        fx hip  . Bladder Cancer Brother   .  Colon cancer Neg Hx   . Esophageal cancer Neg Hx   . Stomach cancer Neg Hx   . Rectal cancer Neg Hx   . Liver cancer Neg Hx    Past Surgical History:  Procedure Laterality Date  . ABDOMINAL HYSTERECTOMY    . CATARACT EXTRACTION  2010   both  . COLONOSCOPY  2016 was most recent   Social History   Socioeconomic History  . Marital status: Widowed    Spouse name: Not on file  . Number of children: Not on file  . Years of education: Not on file  . Highest education level: Not on file  Occupational History  . Occupation: retired  Scientific laboratory technician  . Financial resource  strain: Not on file  . Food insecurity    Worry: Not on file    Inability: Not on file  . Transportation needs    Medical: Not on file    Non-medical: Not on file  Tobacco Use  . Smoking status: Never Smoker  . Smokeless tobacco: Never Used  Substance and Sexual Activity  . Alcohol use: Yes    Alcohol/week: 0.0 standard drinks    Comment: occ  . Drug use: No  . Sexual activity: Not on file  Lifestyle  . Physical activity    Days per week: Not on file    Minutes per session: Not on file  . Stress: Not on file  Relationships  . Social Herbalist on phone: Not on file    Gets together: Not on file    Attends religious service: Not on file    Active member of club or organization: Not on file    Attends meetings of clubs or organizations: Not on file    Relationship status: Not on file  . Intimate partner violence    Fear of current or ex partner: Not on file    Emotionally abused: Not on file    Physically abused: Not on file    Forced sexual activity: Not on file  Other Topics Concern  . Not on file  Social History Narrative   Retired   Regular exercise- yes   Widowed   At home with children and GKs    HH of 2     Puppy   Plays cards is social and active      Lipid Panel     Component Value Date/Time   CHOL 197 04/08/2018 1019   TRIG 139.0 04/08/2018 1019   HDL 63.50 04/08/2018 1019   CHOLHDL 3 04/08/2018 1019   VLDL 27.8 04/08/2018 1019   LDLCALC 105 (H) 04/08/2018 1019   LDLDIRECT 125.8 02/23/2013 1000    Review of Systems: General: negative for chills, fever, night sweats or weight changes.  Cardiovascular: negative for chest pain, dyspnea on exertion, edema, orthopnea, palpitations, paroxysmal nocturnal dyspnea or shortness of breath Dermatological: negative for rash Respiratory: negative for cough or wheezing Urologic: negative for hematuria Abdominal: negative for nausea, vomiting, diarrhea, bright red blood per rectum, melena, or  hematemesis Neurologic: negative for visual changes, syncope, or dizziness All other systems reviewed and are otherwise negative except as noted above.   Physical Exam:  Blood pressure 138/76, pulse 79, height 5' 2"  (1.575 m), weight 167 lb (75.8 kg), SpO2 96 %.  General appearance: alert, cooperative and no distress Neck: no carotid bruit and no JVD Lungs: clear to auscultation bilaterally Heart: regular rate and rhythm, S1, S2 normal, no murmur, click, rub or gallop Extremities:  extremities normal, atraumatic, no cyanosis or edema Pulses: 2+ and symmetric Skin: Skin color, texture, turgor normal. No rashes or lesions Neurologic: Grossly normal  EKG NSR 79 bpm  -- personally reviewed   ASSESSMENT AND PLAN:   1. PAF: NSR on EKG today. HR controlled in the 70s on CCB. BP well controlled. She denies any recent symptoms of afib. She is on Eliquis for a/c and denies abnormal bleeding. No falls. Recent labs in June showed normal Hgb and SCr. Continue current regimen. She will notify us if any development of symptoms, abnormal bleeding or any falls.    Follow-Up w/ Dr. Angelena Form in 6 months.   Jennesis Ramaswamy Ladoris Gene, MHS [facility] 10/21/2018 3:13 PM

## 2018-10-23 ENCOUNTER — Telehealth: Payer: Self-pay | Admitting: Internal Medicine

## 2018-10-23 DIAGNOSIS — K589 Irritable bowel syndrome without diarrhea: Secondary | ICD-10-CM | POA: Diagnosis not present

## 2018-10-23 DIAGNOSIS — M1990 Unspecified osteoarthritis, unspecified site: Secondary | ICD-10-CM | POA: Diagnosis not present

## 2018-10-23 DIAGNOSIS — M353 Polymyalgia rheumatica: Secondary | ICD-10-CM | POA: Diagnosis not present

## 2018-10-23 DIAGNOSIS — H8110 Benign paroxysmal vertigo, unspecified ear: Secondary | ICD-10-CM | POA: Diagnosis not present

## 2018-10-23 DIAGNOSIS — I480 Paroxysmal atrial fibrillation: Secondary | ICD-10-CM | POA: Diagnosis not present

## 2018-10-23 DIAGNOSIS — E785 Hyperlipidemia, unspecified: Secondary | ICD-10-CM | POA: Diagnosis not present

## 2018-10-23 DIAGNOSIS — R2689 Other abnormalities of gait and mobility: Secondary | ICD-10-CM | POA: Diagnosis not present

## 2018-10-23 DIAGNOSIS — R42 Dizziness and giddiness: Secondary | ICD-10-CM

## 2018-10-23 DIAGNOSIS — Z7901 Long term (current) use of anticoagulants: Secondary | ICD-10-CM | POA: Diagnosis not present

## 2018-10-23 DIAGNOSIS — M810 Age-related osteoporosis without current pathological fracture: Secondary | ICD-10-CM | POA: Diagnosis not present

## 2018-10-23 DIAGNOSIS — M5136 Other intervertebral disc degeneration, lumbar region: Secondary | ICD-10-CM | POA: Diagnosis not present

## 2018-10-23 DIAGNOSIS — K219 Gastro-esophageal reflux disease without esophagitis: Secondary | ICD-10-CM | POA: Diagnosis not present

## 2018-10-23 DIAGNOSIS — Z9181 History of falling: Secondary | ICD-10-CM | POA: Diagnosis not present

## 2018-10-23 DIAGNOSIS — J309 Allergic rhinitis, unspecified: Secondary | ICD-10-CM | POA: Diagnosis not present

## 2018-10-23 DIAGNOSIS — K5790 Diverticulosis of intestine, part unspecified, without perforation or abscess without bleeding: Secondary | ICD-10-CM | POA: Diagnosis not present

## 2018-10-23 DIAGNOSIS — I2510 Atherosclerotic heart disease of native coronary artery without angina pectoris: Secondary | ICD-10-CM | POA: Diagnosis not present

## 2018-10-23 NOTE — Telephone Encounter (Signed)
Patient is calling because [facility] is requesting a referral to an ENT to perhaps have her ear cleaned for vertigo. Please advised CB- [phone]  Patient says that if she needs to she her its ok.

## 2018-10-25 ENCOUNTER — Other Ambulatory Visit: Payer: Self-pay | Admitting: Physician Assistant

## 2018-10-27 DIAGNOSIS — Z7901 Long term (current) use of anticoagulants: Secondary | ICD-10-CM

## 2018-10-27 DIAGNOSIS — K589 Irritable bowel syndrome without diarrhea: Secondary | ICD-10-CM

## 2018-10-27 DIAGNOSIS — R2689 Other abnormalities of gait and mobility: Secondary | ICD-10-CM | POA: Diagnosis not present

## 2018-10-27 DIAGNOSIS — M810 Age-related osteoporosis without current pathological fracture: Secondary | ICD-10-CM

## 2018-10-27 DIAGNOSIS — M353 Polymyalgia rheumatica: Secondary | ICD-10-CM

## 2018-10-27 DIAGNOSIS — I480 Paroxysmal atrial fibrillation: Secondary | ICD-10-CM | POA: Diagnosis not present

## 2018-10-27 DIAGNOSIS — J309 Allergic rhinitis, unspecified: Secondary | ICD-10-CM

## 2018-10-27 DIAGNOSIS — E785 Hyperlipidemia, unspecified: Secondary | ICD-10-CM

## 2018-10-27 DIAGNOSIS — M1990 Unspecified osteoarthritis, unspecified site: Secondary | ICD-10-CM

## 2018-10-27 DIAGNOSIS — K219 Gastro-esophageal reflux disease without esophagitis: Secondary | ICD-10-CM

## 2018-10-27 DIAGNOSIS — K5790 Diverticulosis of intestine, part unspecified, without perforation or abscess without bleeding: Secondary | ICD-10-CM

## 2018-10-27 DIAGNOSIS — M5136 Other intervertebral disc degeneration, lumbar region: Secondary | ICD-10-CM | POA: Diagnosis not present

## 2018-10-27 DIAGNOSIS — Z9181 History of falling: Secondary | ICD-10-CM

## 2018-10-27 DIAGNOSIS — I2510 Atherosclerotic heart disease of native coronary artery without angina pectoris: Secondary | ICD-10-CM

## 2018-10-27 DIAGNOSIS — F419 Anxiety disorder, unspecified: Secondary | ICD-10-CM

## 2018-10-27 DIAGNOSIS — H8110 Benign paroxysmal vertigo, unspecified ear: Secondary | ICD-10-CM | POA: Diagnosis not present

## 2018-10-27 NOTE — Telephone Encounter (Signed)
Referral has been placed. Patient has been notified.

## 2018-10-27 NOTE — Telephone Encounter (Addendum)
Ok to refer to ent for vertigo .  Asee below

## 2018-11-04 DIAGNOSIS — L821 Other seborrheic keratosis: Secondary | ICD-10-CM | POA: Diagnosis not present

## 2018-11-04 DIAGNOSIS — L570 Actinic keratosis: Secondary | ICD-10-CM | POA: Diagnosis not present

## 2018-11-04 DIAGNOSIS — Z85828 Personal history of other malignant neoplasm of skin: Secondary | ICD-10-CM | POA: Diagnosis not present

## 2018-11-04 DIAGNOSIS — L812 Freckles: Secondary | ICD-10-CM | POA: Diagnosis not present

## 2018-11-10 ENCOUNTER — Other Ambulatory Visit: Payer: Self-pay | Admitting: Cardiovascular Disease

## 2018-11-10 NOTE — Telephone Encounter (Signed)
Last OV 10/21/2018 85 years old 75kg Scr 1.05 on 09/17/2018 Eliquis 32m BID sent to pharmacy

## 2018-11-13 ENCOUNTER — Ambulatory Visit: Payer: Medicare Other | Admitting: Family Medicine

## 2018-11-13 ENCOUNTER — Other Ambulatory Visit: Payer: Medicare Other

## 2018-11-13 ENCOUNTER — Ambulatory Visit (INDEPENDENT_AMBULATORY_CARE_PROVIDER_SITE_OTHER): Payer: Medicare Other

## 2018-11-13 ENCOUNTER — Other Ambulatory Visit: Payer: Self-pay

## 2018-11-13 ENCOUNTER — Encounter: Payer: Self-pay | Admitting: Family Medicine

## 2018-11-13 VITALS — BP 140/70 | HR 74 | Temp 98.3°F | Wt 168.2 lb

## 2018-11-13 DIAGNOSIS — S93401A Sprain of unspecified ligament of right ankle, initial encounter: Secondary | ICD-10-CM

## 2018-11-13 NOTE — Progress Notes (Signed)
   Subjective:    Patient ID: Ashley Lawson, female    DOB: [birth year], 85 y.o.   MRN: [id number]  HPI Here for an injury to the right ankle that occurred one week ago. While walking her dog, she stepped on the edge of the pavement and twisted the right ankle. It swelled up quickly and was quite painful, but no bruising was seen. She has been applying ice to it several times a day since then. The swelling has resolved, and the pain is much less, though she still has some pain to walk on it. Talking only Tylenol prn.    Review of Systems  Constitutional: Negative.   Respiratory: Negative.   Cardiovascular: Negative.   Musculoskeletal: Positive for arthralgias and joint swelling.       Objective:   Physical Exam Constitutional:      Appearance: Normal appearance.     Comments: She walks with a slight limp   Cardiovascular:     Rate and Rhythm: Normal rate and regular rhythm.     Pulses: Normal pulses.     Heart sounds: Normal heart sounds.  Pulmonary:     Effort: Pulmonary effort is normal.     Breath sounds: Normal breath sounds.  Musculoskeletal:     Comments: The right lateral ankle is tender just inferior to the lateral malleolus. There is no swelling or ecchymosis. ROM is full. No crepitus.   Neurological:     Mental Status: She is alert.           Assessment & Plan:  Ankle sprain. I doubt there are any fractures but we will send her for Xrays just to be complete. I gave her an elastic support sleeve to wear. She can continue to elevate it, ice it, and take Tylenol prn.  Alysia Penna, MD

## 2018-11-23 ENCOUNTER — Other Ambulatory Visit: Payer: Self-pay | Admitting: Internal Medicine

## 2018-11-23 NOTE — Telephone Encounter (Signed)
Last ov:11/12/17 Last filled:10/13/2018

## 2018-12-21 ENCOUNTER — Other Ambulatory Visit: Payer: Self-pay | Admitting: Physician Assistant

## 2018-12-21 ENCOUNTER — Other Ambulatory Visit: Payer: Self-pay | Admitting: Internal Medicine

## 2018-12-21 NOTE — Telephone Encounter (Signed)
Last ov:09/23/18 Last filled:11/23/2018

## 2019-01-18 ENCOUNTER — Ambulatory Visit: Payer: Medicare Other | Admitting: Physician Assistant

## 2019-01-18 ENCOUNTER — Other Ambulatory Visit: Payer: Self-pay

## 2019-01-18 ENCOUNTER — Encounter: Payer: Self-pay | Admitting: Physician Assistant

## 2019-01-18 ENCOUNTER — Other Ambulatory Visit (INDEPENDENT_AMBULATORY_CARE_PROVIDER_SITE_OTHER): Payer: Medicare Other

## 2019-01-18 VITALS — BP 130/70 | HR 80 | Temp 98.1°F | Ht 62.0 in | Wt 169.2 lb

## 2019-01-18 DIAGNOSIS — R1032 Left lower quadrant pain: Secondary | ICD-10-CM | POA: Diagnosis not present

## 2019-01-18 DIAGNOSIS — K219 Gastro-esophageal reflux disease without esophagitis: Secondary | ICD-10-CM

## 2019-01-18 DIAGNOSIS — K5792 Diverticulitis of intestine, part unspecified, without perforation or abscess without bleeding: Secondary | ICD-10-CM | POA: Diagnosis not present

## 2019-01-18 DIAGNOSIS — R300 Dysuria: Secondary | ICD-10-CM | POA: Diagnosis not present

## 2019-01-18 LAB — URINALYSIS
Bilirubin Urine: NEGATIVE
Ketones, ur: NEGATIVE
Leukocytes,Ua: NEGATIVE
Nitrite: NEGATIVE
Specific Gravity, Urine: <=1.005 — AB (ref 1.000–1.030)
Total Protein, Urine: NEGATIVE
Urine Glucose: NEGATIVE
Urobilinogen, UA: 0.2 (ref 0.0–1.0)
pH: 5.5 (ref 5.0–8.0)

## 2019-01-18 LAB — CBC WITH DIFFERENTIAL/PLATELET
Basophils Absolute: 0.1 10*3/uL (ref 0.0–0.1)
Basophils Relative: 0.8 % (ref 0.0–3.0)
Eosinophils Absolute: 0.1 10*3/uL (ref 0.0–0.7)
Eosinophils Relative: 1.2 % (ref 0.0–5.0)
HCT: 40.3 % (ref 36.0–46.0)
Hemoglobin: 13.5 g/dL (ref 12.0–15.0)
Lymphocytes Relative: 30 % (ref 12.0–46.0)
Lymphs Abs: 2.4 10*3/uL (ref 0.7–4.0)
MCHC: 33.4 g/dL (ref 30.0–36.0)
MCV: 95.3 fl (ref 78.0–100.0)
Monocytes Absolute: 0.6 10*3/uL (ref 0.1–1.0)
Monocytes Relative: 8 % (ref 3.0–12.0)
Neutro Abs: 4.8 10*3/uL (ref 1.4–7.7)
Neutrophils Relative %: 60 % (ref 43.0–77.0)
Platelets: 247 10*3/uL (ref 150.0–400.0)
RBC: 4.22 Mil/uL (ref 3.87–5.11)
RDW: 13.2 % (ref 11.5–15.5)
WBC: 8.1 10*3/uL (ref 4.0–10.5)

## 2019-01-18 LAB — BASIC METABOLIC PANEL
BUN: 9 mg/dL (ref 6–23)
CO2: 27 mEq/L (ref 19–32)
Calcium: 9.5 mg/dL (ref 8.4–10.5)
Chloride: 97 mEq/L (ref 96–112)
Creatinine, Ser: 0.89 mg/dL (ref 0.40–1.20)
GFR: 60.34 mL/min (ref >60.00)
Glucose, Bld: 87 mg/dL (ref 70–99)
Potassium: 3.7 mEq/L (ref 3.5–5.1)
Sodium: 134 mEq/L — ABNORMAL LOW (ref 135–145)

## 2019-01-18 MED ORDER — CIPROFLOXACIN HCL 500 MG PO TABS: 500.0000 mg | ORAL_TABLET | Freq: Two times a day (BID) | ORAL | 0 refills | Status: DC

## 2019-01-18 MED ORDER — PANTOPRAZOLE SODIUM 40 MG PO TBEC: 40.0000 mg | DELAYED_RELEASE_TABLET | Freq: Every day | ORAL | 11 refills | Status: DC

## 2019-01-18 NOTE — Progress Notes (Signed)
Subjective:    Patient ID: Ashley Lawson, female    DOB: 1932/06/12, 85 y.o.   MRN: [id number]  HPI Ashley Lawson is a pleasant 85 year old white female, known to Dr. Carlean Purl with history of atrial fibrillation, IBS, GERD, diverticulosis and colon polyps.  She comes in today with complaints of acid reflux, and lower abdominal pressure/pain. At the time of her last office visit here in February Dr. Carlean Purl had advise she try Pepcid [facility] as needed for GERD symptoms.  Her daughter says she generally forgets to take the Pepcid, but has been having more frequent symptoms recently at least 2-3 times weekly with what sounds like sour brash after meals.  She denies any dysphagia or odynophagia no heartburn or indigestion.  She says she will have liquid and/or small pieces of food come back up after eating. She is also complaining of a very low/suprapubic pressure type discomfort which is bothering her at night.  She says she has to get up frequently to urinate and that this discomfort is better with an empty bladder.  She had been seen by urology in the past but neither she nor her daughter are clear about what her diagnosis was.  She says she did have to have her bladder stretched at one point. She also has been having some lower abdominal discomfort over the past several weeks.  Bowel movements have been fairly normal.  No fever or chills.  And no dysuria. Last colonoscopy June 2016 with 3 sessile polyps removed and left-sided diverticulosis.  Biopsies showed the polyps to be hyperplastic.  She has not had prior EGD.  Review of Systems Pertinent positive and negative review of systems were noted in the above HPI section.  All other review of systems was otherwise negative.  Outpatient Encounter Medications as of 01/18/2019  Medication Sig  . acetaminophen (TYLENOL) 500 MG tablet Take 500 mg by mouth every 6 (six) hours as needed for mild pain, moderate pain or headache.  . ALPRAZolam (XANAX) 0.5 MG tablet Take  0.5 mg by mouth at bedtime as needed for anxiety.  . AMBULATORY NON FORMULARY MEDICATION Medication Name: Diltiazem 2% with lidocaine 5% Insert a pea size amount into rectum to the first knuckle TID  . Ascorbic Acid (VITAMIN C) 100 MG tablet Take 100 mg by mouth daily.    . calcium carbonate (OS-CAL) 1250 (500 Ca) MG chewable tablet Chew 1 tablet by mouth 2 (two) times daily.  Marland Kitchen conjugated estrogens (PREMARIN) vaginal cream Place 1 Applicatorful vaginally daily as needed (dryness).  Marland Kitchen diltiazem (CARDIZEM CD) 120 MG 24 hr capsule TAKE 1 CAPSULE BY MOUTH EVERY DAY  . ELIQUIS 5 MG TABS tablet TAKE 1 TABLET BY MOUTH TWICE A DAY  . Famotidine (PEPCID AC PO) Take 1 tablet by mouth as needed.  . fluticasone (FLONASE) 50 MCG/ACT nasal spray Place 2 sprays into both nostrils daily.  Marland Kitchen guaiFENesin (MUCINEX) 600 MG 12 hr tablet Take 600 mg by mouth 2 (two) times daily as needed (allergies).  . loratadine (CLARITIN) 10 MG tablet Take 10 mg by mouth daily as needed for allergies.  . magnesium hydroxide (MILK OF MAGNESIA) 400 MG/5ML suspension Take 15 mLs by mouth daily as needed for mild constipation.  . MULTIPLE VITAMIN PO Take 1 tablet by mouth daily.   . polyethylene glycol (MIRALAX / GLYCOLAX) packet Take 17 g by mouth daily.  . Probiotic Product (ALIGN) 4 MG CAPS Take 1 capsule by mouth daily.  . RESTASIS 0.05 % ophthalmic emulsion  Place 1 drop into both eyes 2 (two) times daily.  . ciprofloxacin (CIPRO) 500 MG tablet Take 1 tablet (500 mg total) by mouth 2 (two) times daily. Take with food for 10 days  . pantoprazole (PROTONIX) 40 MG tablet Take 1 tablet (40 mg total) by mouth daily. Take one 30-60 minutes before breakfast  . [DISCONTINUED] LORazepam (ATIVAN) 0.5 MG tablet TAKE 1-2 TABLETS BY MOUTH 2 TIMES DAILY AS NEEDED FOR ANXIETY. AVOID REGULAR USE  . [DISCONTINUED] meclizine (ANTIVERT) 25 MG tablet Take 1 tablet (25 mg total) by mouth 3 (three) times daily as needed for dizziness.   No  facility-administered encounter medications on file as of 01/18/2019.    Allergies  Allergen Reactions  . Alendronate Sodium     upset stomach  . Penicillins     Has patient had a PCN reaction causing immediate rash, facial/tongue/throat swelling, SOB or lightheadedness with hypotension: Yes Has patient had a PCN reaction causing severe rash involving mucus membranes or skin necrosis: No Has patient had a PCN reaction that required hospitalization: No Has patient had a PCN reaction occurring within the last 10 years: No If all of the above answers are "NO", then may proceed with Cephalosporin use.   Marland Kitchen Amoxicillin Rash    Has patient had a PCN reaction causing immediate rash, facial/tongue/throat swelling, SOB or lightheadedness with hypotension: yes Has patient had a PCN reaction causing severe rash involving mucus membranes or skin necrosis: no Has patient had a PCN reaction that required hospitalization: no Has patient had PCN reaction within the last 10 years: no  If all of the above answers are "NO", then may proceed with Cephalosporin use.  REACTION: unspecified  . Metronidazole Other (See Comments)    Pounding headaches patient says was bad.  With cipro for diverticulitis rx. Other reaction(s): Other (See Comments) Pounding headaches patient says was bad.With cipro for diverticulitis rx.   Patient Active Problem List   Diagnosis Date Noted  . Dizziness 09/17/2018  . Abdominal pain 09/17/2018  . AF (paroxysmal atrial fibrillation) ([place]) 09/17/2018  . Chest discomfort 05/13/2018  . Coagulation defect ([place]) 01/30/2018  . Deviated septum 01/30/2018  . Epistaxis, recurrent 01/30/2018  . Atrial fibrillation with RVR ([place]) 05/02/2017  . Rhinitis, allergic 05/31/2015  . Renal cyst incidental  01/19/2015  . Heartburn 01/14/2014  . Gastroesophageal reflux disease without esophagitis 01/14/2014  . Osteoporosis 01/14/2014  . IBS (irritable bowel syndrome)   . Nail, ingrown  06/22/2012  . Arthritis 02/19/2012  . Screening, lipid 02/18/2011  . Rash and nonspecific skin eruption 02/18/2011  . Visit for preventive health examination 02/18/2011  . Skin cancer 10/14/2010  . Abdominal bloating 10/08/2010  . CONSTIPATION, CHRONIC 01/15/2010  . Lower urinary tract symptoms (LUTS) 01/15/2010  . SUPRAPUBIC PAIN 08/01/2009  . ABDOMINAL PAIN, EPIGASTRIC 06/16/2009  . DIVERTICULOSIS, COLON 06/07/2007  . Hyperlipidemia 06/05/2007  . ANXIETY STATE, UNSPECIFIED 06/05/2007  . Allergic rhinitis 06/05/2007  . IBS 06/05/2007  . OSTEOPOROSIS 06/05/2007  . ABDOMINAL PAIN, GENERALIZED 06/05/2007   Social History   Socioeconomic History  . Marital status: Widowed    Spouse name: Not on file  . Number of children: Not on file  . Years of education: Not on file  . Highest education level: Not on file  Occupational History  . Occupation: retired  Scientific laboratory technician  . Financial resource strain: Not on file  . Food insecurity    Worry: Not on file    Inability: Not on file  .  Transportation needs    Medical: Not on file    Non-medical: Not on file  Tobacco Use  . Smoking status: Never Smoker  . Smokeless tobacco: Never Used  Substance and Sexual Activity  . Alcohol use: Yes    Alcohol/week: 0.0 standard drinks    Comment: occ  . Drug use: No  . Sexual activity: Not on file  Lifestyle  . Physical activity    Days per week: Not on file    Minutes per session: Not on file  . Stress: Not on file  Relationships  . Social Herbalist on phone: Not on file    Gets together: Not on file    Attends religious service: Not on file    Active member of club or organization: Not on file    Attends meetings of clubs or organizations: Not on file    Relationship status: Not on file  . Intimate partner violence    Fear of current or ex partner: Not on file    Emotionally abused: Not on file    Physically abused: Not on file    Forced sexual activity: Not on file   Other Topics Concern  . Not on file  Social History Narrative   Retired   Regular exercise- yes   Widowed   At home with children and GKs    HH of 2     Puppy   Plays cards is social and active     Ms. Slaydon's family history includes Bladder Cancer in her brother; Other in her mother and sister.      Objective:    Vitals:   01/18/19 1510  BP: 130/70  Pulse: 80  Temp: 98.1 F (36.7 C)    Physical Exam Well-developed well-nourished elderly white female in no acute distress.  Accompanied by her daughter- height, Weight, 169 BMI 30.9  HEENT; nontraumatic normocephalic, EOMI, PE RR LA, sclera anicteric. Oropharynx; not examined/mask/Covid Neck; supple, no JVD Cardiovascular; regular rate and rhythm with S1-S2, no murmur rub or gallop Pulmonary; Clear bilaterally Abdomen; soft, she is tender in the left mid and left lower quadrant no guarding, no palpable mass or hepatosplenomegaly nondistended,   bowel sounds are active Rectal; not done today Skin; benign exam, no jaundice rash or appreciable lesions Extremities; no clubbing cyanosis or edema skin warm and dry Neuro/Psych; alert and oriented x4, grossly nonfocal mood and affect appropriate       Assessment & Plan:  #39 85 year old white female with GERD manifested with sour brash postprandially #2 left mid quadrant/left lower quadrant pain x3 to 4 weeks, rule out smoldering diverticulitis #3 bladder dysfunction #4.  History of hyperplastic colon polyps   Plan; Start Protonix 40 mg p.o. every morning AC breakfast to be taken on a daily basis. CBC with differential, be met and sed rate Start Cipro 500 mg p.o. twice daily x10 days for diverticulitis.  Patient and daughter asked to call back if her symptoms have not significantly improved when she finishes the antibiotic.  At that point would consider CT of the abdomen and pelvis and/or trial of an antispasmodic. They were asked to call urology to schedule follow-up  appointment with Dr. Dorna Bloom who she had seen in the past   Giuseppina Quinones Genia Harold PA-C 01/18/2019   Cc: Burnis Medin, MD

## 2019-01-18 NOTE — Patient Instructions (Signed)
If you are age 83 or older, your body mass index should be between 23-30. Your Body mass index is 30.95 kg/m. If this is out of the aforementioned range listed, please consider follow up with your Primary Care Provider.  If you are age 72 or younger, your body mass index should be between 19-25. Your Body mass index is 30.95 kg/m. If this is out of the aformentioned range listed, please consider follow up with your Primary Care Provider.   Your provider has requested that you go to the basement level for lab work before leaving today. Press "B" on the elevator. The lab is located at the first door on the left as you exit the elevator.  We have sent the following medications to your pharmacy for you to pick up at your convenience: Cipro Protonix  Call back after you finish antibiotics if pain has not resolved.  Ask for Dr. Celesta Aver nurse.  Thank you for choosing me and [facility].   Amy Esterwood, PA-C

## 2019-01-27 ENCOUNTER — Other Ambulatory Visit: Payer: Self-pay | Admitting: Internal Medicine

## 2019-01-27 NOTE — Telephone Encounter (Addendum)
Last ov:01/18/2019 Last filled : 12/21/2018

## 2019-02-03 DIAGNOSIS — N3020 Other chronic cystitis without hematuria: Secondary | ICD-10-CM | POA: Diagnosis not present

## 2019-02-12 ENCOUNTER — Telehealth: Payer: Self-pay | Admitting: Internal Medicine

## 2019-02-12 NOTE — Telephone Encounter (Signed)
Patient notified that it is safe to take Miralx daily. She will call back for any questions or concerns.

## 2019-02-12 NOTE — Telephone Encounter (Signed)
Patient states she has been taking miralax everyday- she was not sure if she was supposed to take it every day or not and if there was something else she could take to help. She said she still constipated. Does not know if she did something wrong.

## 2019-02-23 ENCOUNTER — Other Ambulatory Visit: Payer: Self-pay | Admitting: Internal Medicine

## 2019-02-23 NOTE — Telephone Encounter (Signed)
Have her get on cpx schedule for January when due  I will refill x 1 until then

## 2019-02-23 NOTE — Telephone Encounter (Signed)
Last ov:09/23/2018 Last filled:01/27/2019

## 2019-02-24 NOTE — Telephone Encounter (Signed)
Pt has been scheduled for cpe in jan

## 2019-03-03 ENCOUNTER — Telehealth (INDEPENDENT_AMBULATORY_CARE_PROVIDER_SITE_OTHER): Payer: Medicare Other | Admitting: Family Medicine

## 2019-03-03 ENCOUNTER — Encounter: Payer: Self-pay | Admitting: Family Medicine

## 2019-03-03 ENCOUNTER — Other Ambulatory Visit: Payer: Self-pay

## 2019-03-03 DIAGNOSIS — J0191 Acute recurrent sinusitis, unspecified: Secondary | ICD-10-CM | POA: Diagnosis not present

## 2019-03-03 MED ORDER — DOXYCYCLINE HYCLATE 100 MG PO CAPS: 100.0000 mg | ORAL_CAPSULE | Freq: Two times a day (BID) | ORAL | 0 refills | Status: AC

## 2019-03-03 NOTE — Progress Notes (Signed)
Virtual Visit via Video Note  I connected with the patient on 03/03/19 at  1:15 PM EST by a video enabled telemedicine application and verified that I am speaking with the correct person using two identifiers.  Location patient: home Location provider:work or home office Persons participating in the virtual visit: patient, provider  I discussed the limitations of evaluation and management by telemedicine and the availability of in person appointments. The patient expressed understanding and agreed to proceed.   HPI: Here for 2 days of sinus pressure, PND, ST, and a dry cough. No fever or body aches. No SOB or chest pain. No NVD. Taking Mucinex and Claritin and drinking water.    ROS: See pertinent positives and negatives per HPI.  Past Medical History:  Diagnosis Date  . Abdominal pain 12/2009   hospitalized  . Allergic rhinitis   . Chronic constipation   . Compression fx, lumbar spine ([place])   . DDD (degenerative disc disease), lumbar   . Diverticulosis of colon   . Dog bite(E906.0) 08/30/2011  . GERD (gastroesophageal reflux disease)   . Hepatic steatosis   . Hyperlipidemia   . Hyperplastic colon polyp   . IBS (irritable bowel syndrome)    constipation predominant  . Internal hemorrhoids   . Osteoporosis    dexa 2011 -2.7 hip nl spine  . PAF (paroxysmal atrial fibrillation) ([place])    a. dx 05/2017.  Marland Kitchen PMR (polymyalgia rheumatica) ([facility]) 08/30/2011   under rheum care  on low dose pred 5 mg   . Renal disorder     Past Surgical History:  Procedure Laterality Date  . ABDOMINAL HYSTERECTOMY     with bladder tact and rectocele repair  . CATARACT EXTRACTION  2010   both  . COLONOSCOPY  2016 was most recent    Family History  Problem Relation Age of Onset  . Other Mother        fx hip  . Other Sister        fx hip  . Bladder Cancer Brother   . Colon cancer Neg Hx   . Esophageal cancer Neg Hx   . Stomach cancer Neg Hx   . Rectal cancer Neg Hx   . Liver cancer Neg Hx       Current Outpatient Medications:  .  acetaminophen (TYLENOL) 500 MG tablet, Take 500 mg by mouth every 6 (six) hours as needed for mild pain, moderate pain or headache., Disp: , Rfl:  .  ALPRAZolam (XANAX) 0.5 MG tablet, Take 0.5 mg by mouth at bedtime as needed for anxiety., Disp: , Rfl:  .  AMBULATORY NON FORMULARY MEDICATION, Medication Name: Diltiazem 2% with lidocaine 5% Insert a pea size amount into rectum to the first knuckle TID, Disp: 30 g, Rfl: 3 .  Ascorbic Acid (VITAMIN C) 100 MG tablet, Take 100 mg by mouth daily.  , Disp: , Rfl:  .  calcium carbonate (OS-CAL) 1250 (500 Ca) MG chewable tablet, Chew 1 tablet by mouth 2 (two) times daily., Disp: , Rfl:  .  ciprofloxacin (CIPRO) 500 MG tablet, Take 1 tablet (500 mg total) by mouth 2 (two) times daily. Take with food for 10 days, Disp: 20 tablet, Rfl: 0 .  conjugated estrogens (PREMARIN) vaginal cream, Place 1 Applicatorful vaginally daily as needed (dryness)., Disp: , Rfl:  .  diltiazem (CARDIZEM CD) 120 MG 24 hr capsule, TAKE 1 CAPSULE BY MOUTH EVERY DAY, Disp: 90 capsule, Rfl: 3 .  doxycycline (VIBRAMYCIN) 100 MG capsule, Take 1  capsule (100 mg total) by mouth 2 (two) times daily for 10 days., Disp: 20 capsule, Rfl: 0 .  ELIQUIS 5 MG TABS tablet, TAKE 1 TABLET BY MOUTH TWICE A DAY, Disp: 180 tablet, Rfl: 1 .  Famotidine (PEPCID AC PO), Take 1 tablet by mouth as needed., Disp: , Rfl:  .  fluticasone (FLONASE) 50 MCG/ACT nasal spray, Place 2 sprays into both nostrils daily., Disp: , Rfl:  .  guaiFENesin (MUCINEX) 600 MG 12 hr tablet, Take 600 mg by mouth 2 (two) times daily as needed (allergies)., Disp: , Rfl:  .  loratadine (CLARITIN) 10 MG tablet, Take 10 mg by mouth daily as needed for allergies., Disp: , Rfl:  .  LORazepam (ATIVAN) 0.5 MG tablet, TAKE 1-2 TABLETS BY MOUTH 2 TIMES DAILY AS NEEDED FOR ANXIETY. AVOID REGULAR USE, Disp: 20 tablet, Rfl: 0 .  magnesium hydroxide (MILK OF MAGNESIA) 400 MG/5ML suspension, Take 15 mLs by  mouth daily as needed for mild constipation., Disp: , Rfl:  .  MULTIPLE VITAMIN PO, Take 1 tablet by mouth daily. , Disp: , Rfl:  .  pantoprazole (PROTONIX) 40 MG tablet, Take 1 tablet (40 mg total) by mouth daily. Take one 30-60 minutes before breakfast, Disp: 30 tablet, Rfl: 11 .  polyethylene glycol (MIRALAX / GLYCOLAX) packet, Take 17 g by mouth daily., Disp: , Rfl:  .  Probiotic Product (ALIGN) 4 MG CAPS, Take 1 capsule by mouth daily., Disp: , Rfl:  .  RESTASIS 0.05 % ophthalmic emulsion, Place 1 drop into both eyes 2 (two) times daily., Disp: , Rfl: 3  EXAM:  VITALS per patient if applicable:  GENERAL: alert, oriented, appears well and in no acute distress  HEENT: atraumatic, conjunttiva clear, no obvious abnormalities on inspection of external nose and ears  NECK: normal movements of the head and neck  LUNGS: on inspection no signs of respiratory distress, breathing rate appears normal, no obvious gross SOB, gasping or wheezing  CV: no obvious cyanosis  MS: moves all visible extremities without noticeable abnormality  PSYCH/NEURO: pleasant and cooperative, no obvious depression or anxiety, speech and thought processing grossly intact  ASSESSMENT AND PLAN: This sounds like her typical sinusitis. We will treat with Doxycycline.  Alysia Penna, MD  Discussed the following assessment and plan:  No diagnosis found.     I discussed the assessment and treatment plan with the patient. The patient was provided an opportunity to ask questions and all were answered. The patient agreed with the plan and demonstrated an understanding of the instructions.   The patient was advised to call back or seek an in-person evaluation if the symptoms worsen or if the condition fails to improve as anticipated.

## 2019-03-09 DIAGNOSIS — R109 Unspecified abdominal pain: Secondary | ICD-10-CM | POA: Diagnosis not present

## 2019-03-09 DIAGNOSIS — N3020 Other chronic cystitis without hematuria: Secondary | ICD-10-CM | POA: Diagnosis not present

## 2019-03-11 ENCOUNTER — Other Ambulatory Visit: Payer: Self-pay

## 2019-03-12 ENCOUNTER — Encounter: Payer: Self-pay | Admitting: Family Medicine

## 2019-03-12 ENCOUNTER — Ambulatory Visit: Payer: Medicare Other | Admitting: Family Medicine

## 2019-03-12 VITALS — BP 90/62 | HR 61 | Temp 97.6°F | Ht 62.0 in | Wt 164.0 lb

## 2019-03-12 DIAGNOSIS — H6982 Other specified disorders of Eustachian tube, left ear: Secondary | ICD-10-CM | POA: Diagnosis not present

## 2019-03-12 MED ORDER — METHYLPREDNISOLONE ACETATE 40 MG/ML IJ SUSP
40.0000 mg | Freq: Once | INTRAMUSCULAR | Status: AC
  Administered 2019-03-12: 40 mg via INTRAMUSCULAR

## 2019-03-12 MED ORDER — METHYLPREDNISOLONE ACETATE 80 MG/ML IJ SUSP
80.0000 mg | Freq: Once | INTRAMUSCULAR | Status: AC
  Administered 2019-03-12: 80 mg via INTRAMUSCULAR

## 2019-03-12 NOTE — Patient Instructions (Signed)
There are no preventive care reminders to display for this patient.  Depression screen [facility] 2/9 04/08/2018 04/03/2017 01/31/2016  Decreased Interest 0 0 0  Down, Depressed, Hopeless 0 0 0  PHQ - 2 Score 0 0 0

## 2019-03-12 NOTE — Progress Notes (Signed)
   Subjective:    Patient ID: Ashley Lawson, female    DOB: 05-Nov-1932, 85 y.o.   MRN: [id number]  HPI Here for mild discomfort and a stopped up sensation in the left ear. We had a virtual visit with her on 03-03-19 for symptoms of a sinus infection including ST and a cough. We gave her 10 days of Doxycycline, and she feels much better now. Really the ear issue is the only thing bothering her. She is also taking Claritin and Mucinex.    Review of Systems  Constitutional: Negative.   HENT: Positive for ear pain and hearing loss. Negative for congestion, ear discharge, postnasal drip, sinus pain and sore throat.   Eyes: Negative.   Respiratory: Negative.        Objective:   Physical Exam Constitutional:      Appearance: Normal appearance.  HENT:     Right Ear: Tympanic membrane, ear canal and external ear normal.     Left Ear: Tympanic membrane, ear canal and external ear normal.     Nose: Nose normal.     Mouth/Throat:     Pharynx: Oropharynx is clear.  Eyes:     Conjunctiva/sclera: Conjunctivae normal.  Pulmonary:     Effort: Pulmonary effort is normal.     Breath sounds: Normal breath sounds.  Lymphadenopathy:     Cervical: No cervical adenopathy.  Neurological:     Mental Status: She is alert.           Assessment & Plan:  The sinusitis has almost resolved, but she now has a blocked eustachian tube. She is given a DeoMedrol shot. Recheck prn.  Alysia Penna, MD

## 2019-03-24 ENCOUNTER — Telehealth: Payer: Self-pay | Admitting: Family Medicine

## 2019-03-24 MED ORDER — METHYLPREDNISOLONE 4 MG PO TBPK: ORAL_TABLET | ORAL | 0 refills | Status: DC

## 2019-03-24 NOTE — Telephone Encounter (Signed)
There are no drops that would help her problem. I will send in a Medrol dose pack. Hopefully this will help.

## 2019-03-24 NOTE — Telephone Encounter (Signed)
Message Routed to PCP CMA 

## 2019-03-24 NOTE — Telephone Encounter (Signed)
Spoke with patient. She is aware of Dr. Barbie Banner message below.

## 2019-03-24 NOTE — Telephone Encounter (Signed)
Pt stated her ears are back to feeling stopped up after she saw Dr. Sarajane Jews on 03/12/19. She would like to know if Dr. Sarajane Jews would be able to send a rx for an ear drop or if there is anything over the counter she could use. Please advise.  [id number]:  [phone] Fax:  [phone]

## 2019-03-29 ENCOUNTER — Other Ambulatory Visit: Payer: Self-pay | Admitting: Internal Medicine

## 2019-03-29 NOTE — Telephone Encounter (Signed)
Last ov:09/23/2018 Last filled:02/23/2019 Upcoming :04/07/2019

## 2019-03-31 ENCOUNTER — Telehealth: Payer: Self-pay | Admitting: *Deleted

## 2019-03-31 DIAGNOSIS — H9203 Otalgia, bilateral: Secondary | ICD-10-CM

## 2019-03-31 NOTE — Telephone Encounter (Signed)
Do you want to refer to ENT or have her f/u with her PCP

## 2019-03-31 NOTE — Telephone Encounter (Signed)
I referred her to ENT

## 2019-03-31 NOTE — Addendum Note (Signed)
Addended by: Alysia Penna A on: 03/31/2019 04:30 PM   Modules accepted: Orders

## 2019-03-31 NOTE — Telephone Encounter (Signed)
Copied from [place]. Topic: General - Other >> Mar 31, 2019 10:48 AM Rainey Pines A wrote: Patient would like a callback from nurse in regards to being seen last week and her ears are still hurting. Patient has finished medication and would like to know what Dr. Sarajane Jews would further advise

## 2019-04-01 NOTE — Telephone Encounter (Signed)
Patient notified that ENT referral has been placed

## 2019-04-06 ENCOUNTER — Other Ambulatory Visit: Payer: Self-pay

## 2019-04-07 ENCOUNTER — Ambulatory Visit: Payer: Medicare Other | Admitting: Internal Medicine

## 2019-04-07 ENCOUNTER — Encounter: Payer: Self-pay | Admitting: Internal Medicine

## 2019-04-07 VITALS — BP 130/64 | HR 66 | Temp 97.8°F | Ht 62.0 in | Wt 164.2 lb

## 2019-04-07 DIAGNOSIS — M542 Cervicalgia: Secondary | ICD-10-CM | POA: Diagnosis not present

## 2019-04-07 DIAGNOSIS — Z79899 Other long term (current) drug therapy: Secondary | ICD-10-CM

## 2019-04-07 DIAGNOSIS — E785 Hyperlipidemia, unspecified: Secondary | ICD-10-CM

## 2019-04-07 DIAGNOSIS — J309 Allergic rhinitis, unspecified: Secondary | ICD-10-CM

## 2019-04-07 DIAGNOSIS — H9203 Otalgia, bilateral: Secondary | ICD-10-CM | POA: Diagnosis not present

## 2019-04-07 DIAGNOSIS — Z0000 Encounter for general adult medical examination without abnormal findings: Secondary | ICD-10-CM | POA: Diagnosis not present

## 2019-04-07 DIAGNOSIS — J342 Deviated nasal septum: Secondary | ICD-10-CM

## 2019-04-07 DIAGNOSIS — R3989 Other symptoms and signs involving the genitourinary system: Secondary | ICD-10-CM

## 2019-04-07 DIAGNOSIS — Z7901 Long term (current) use of anticoagulants: Secondary | ICD-10-CM

## 2019-04-07 DIAGNOSIS — R0981 Nasal congestion: Secondary | ICD-10-CM

## 2019-04-07 DIAGNOSIS — F419 Anxiety disorder, unspecified: Secondary | ICD-10-CM

## 2019-04-07 LAB — POCT URINALYSIS DIPSTICK
Glucose, UA: NEGATIVE
Leukocytes, UA: NEGATIVE
Protein, UA: POSITIVE — AB
Spec Grav, UA: 1.02 (ref 1.010–1.025)
Urobilinogen, UA: 0.2 E.U./dL
pH, UA: 6 (ref 5.0–8.0)

## 2019-04-07 NOTE — Progress Notes (Signed)
Chief Complaint  Patient presents with  . Annual Exam    Pt is concerned about her ears they are still painful and is now having neck pain     HPI: Ashley Lawson 85 y.o. comes in today for Preventive Medicare exam/  And problem follow up Since last visit with Dr. Sarajane Jews her ear pain improved after a shot of steroid.  Said she had large clumps come out of her nose area that helped a bit since then she has had pain in the right side of her neck when she turns to the right but no trauma or radiation she feels that the right side of her face was involved with the problem. Feels like her ears are raw from the inside bilaterally right greater than left  She has a past history of nosebleeds and on anticoagulants and has a deviated nasal septum. No fevers or cough with this.  She only uses an occasional lorazepam a half at night if needed.  She is on bladder medicine from the urologist for urinary frequency and spasm. Family wants to know if would be better to have a treadmill or an exercise bike for her.  She likes to walk and does not really ride a bike.  Health Maintenance  Topic Date Due  . MAMMOGRAM  09/26/2019  . TETANUS/TDAP  08/29/2021  . INFLUENZA VACCINE  Completed  . DEXA SCAN  Completed  . PNA vac Low Risk Adult  Completed  Now lives at home with her daughter pet dog.  Hearing and vision seem to be okay.  She sees cardiology for her history of A. fib on anticoagulation No history of bone fracture in the last year or fall.   Hearing: ok?  Vision:  No limitations at present . Last eye check UTD  Safety:  Has smoke detector and wears seat belts. Falls:     Depression: No anhedonia unusual crying or depressive symptoms tends to be anxious   Nutrition: Eats well balanced diet; adequate calcium and vitamin D. No swallowing chewing problems.   Other healthcare providers:  Reviewed today .  Preventive parameters Reviewed   ADLS:   There are no problems or need for  assistance  driving, feeding, obtaining food, dressing, toileting and bathing, managing money using phone. She is independent.    ROS: See HPI no current chest pain shortness of breath ear pain and no stuff is the problematic.   Past Medical History:  Diagnosis Date  . Abdominal pain 12/2009   hospitalized  . Allergic rhinitis   . Chronic constipation   . Compression fx, lumbar spine ([place])   . DDD (degenerative disc disease), lumbar   . Diverticulosis of colon   . Dog bite(E906.0) 08/30/2011  . GERD (gastroesophageal reflux disease)   . Hepatic steatosis   . Hyperlipidemia   . Hyperplastic colon polyp   . IBS (irritable bowel syndrome)    constipation predominant  . Internal hemorrhoids   . Osteoporosis    dexa 2011 -2.7 hip nl spine  . PAF (paroxysmal atrial fibrillation) ([place])    a. dx 05/2017.  Marland Kitchen PMR (polymyalgia rheumatica) ([facility]) 08/30/2011   under rheum care  on low dose pred 5 mg   . Renal disorder     Family History  Problem Relation Age of Onset  . Other Mother        fx hip  . Other Sister        fx hip  . Bladder Cancer Brother   .  Colon cancer Neg Hx   . Esophageal cancer Neg Hx   . Stomach cancer Neg Hx   . Rectal cancer Neg Hx   . Liver cancer Neg Hx       Outpatient Encounter Medications as of 04/07/2019  Medication Sig  . conjugated estrogens (PREMARIN) vaginal cream Place 1 Applicatorful vaginally daily as needed (dryness).  Marland Kitchen diltiazem (CARDIZEM CD) 120 MG 24 hr capsule TAKE 1 CAPSULE BY MOUTH EVERY DAY  . ELIQUIS 5 MG TABS tablet TAKE 1 TABLET BY MOUTH TWICE A DAY  . Famotidine (PEPCID AC PO) Take 1 tablet by mouth as needed.  . fluticasone (FLONASE) 50 MCG/ACT nasal spray Place 2 sprays into both nostrils daily.  Marland Kitchen guaiFENesin (MUCINEX) 600 MG 12 hr tablet Take 600 mg by mouth 2 (two) times daily as needed (allergies).  . loratadine (CLARITIN) 10 MG tablet Take 10 mg by mouth daily as needed for allergies.  Marland Kitchen LORazepam (ATIVAN) 0.5 MG tablet  TAKE 1-2 TABLETS BY MOUTH 2 TIMES DAILY AS NEEDED FOR ANXIETY. AVOID REGULAR USE  . magnesium hydroxide (MILK OF MAGNESIA) 400 MG/5ML suspension Take 15 mLs by mouth daily as needed for mild constipation.  . MULTIPLE VITAMIN PO Take 1 tablet by mouth daily.   . pantoprazole (PROTONIX) 40 MG tablet Take 1 tablet (40 mg total) by mouth daily. Take one 30-60 minutes before breakfast  . polyethylene glycol (MIRALAX / GLYCOLAX) packet Take 17 g by mouth daily.  . Probiotic Product (ALIGN) 4 MG CAPS Take 1 capsule by mouth daily.  . RESTASIS 0.05 % ophthalmic emulsion Place 1 drop into both eyes 2 (two) times daily.  Marland Kitchen acetaminophen (TYLENOL) 500 MG tablet Take 500 mg by mouth every 6 (six) hours as needed for mild pain, moderate pain or headache.  . ALPRAZolam (XANAX) 0.5 MG tablet Take 0.5 mg by mouth at bedtime as needed for anxiety.  . AMBULATORY NON FORMULARY MEDICATION Medication Name: Diltiazem 2% with lidocaine 5% Insert a pea size amount into rectum to the first knuckle TID  . Ascorbic Acid (VITAMIN C) 100 MG tablet Take 100 mg by mouth daily.    . calcium carbonate (OS-CAL) 1250 (500 Ca) MG chewable tablet Chew 1 tablet by mouth 2 (two) times daily.  . [DISCONTINUED] ciprofloxacin (CIPRO) 500 MG tablet Take 1 tablet (500 mg total) by mouth 2 (two) times daily. Take with food for 10 days (Patient not taking: Reported on 04/07/2019)  . [DISCONTINUED] methylPREDNISolone (MEDROL DOSEPAK) 4 MG TBPK tablet As directed (Patient not taking: Reported on 04/07/2019)   No facility-administered encounter medications on file as of 04/07/2019.    EXAM:  BP 130/64 (BP Location: Right Arm, Patient Position: Sitting, Cuff Size: Normal)   Pulse 66   Temp 97.8 F (36.6 C) (Temporal)   Ht 5' 2"  (1.575 m)   Wt 164 lb 3.2 oz (74.5 kg)   SpO2 95%   BMI 30.03 kg/m   Body mass index is 30.03 kg/m.  Physical Exam: Vital signs reviewed FTD:DUKG is a well-developed well-nourished alert cooperative   who  appears stated age in no acute distress.  HEENT: normocephalic atraumatic , Eyes: PERRL EOM's full, conjunctiva clear, Nares: Not examined but she feels there is pressure or problem with her right facial paranasal area ears: no deformity EAC's clear TMs with normal landmarks. Mouthmasked NECK:  without masses, thyromegaly or bruits.  Ports stiffness and some discomfort in the right retroauricular area but no masses noted no temporal tenderness. CHEST/PULM:  Clear to auscultation and percussion breath sounds equal no wheeze , rales or rhonchi. No chest wall deformities or tenderness.  Breast no nodules or discharge noted CV: PMI is nondisplaced, S1 S2 no gallops, murmurs, rubs. Peripheral pulses are f present No JVD .  ABDOMEN: Bowel sounds normal tends to be pointed tender in the left inguinal area of note this is an old finding over the years.  No guard or rebound, no hepato splenomegal no CVA tenderness.   Extremtities:  No clubbing cyanosis or edema, no acute joint swelling or redness no focal atrophy has changes of DJD in the hands. NEURO:  Oriented x3, cranial nerves 3-12 appear to be intact, no obvious focal weakness,gait within normal limits no abnormal reflexes or asymmetrical SKIN: No acute rashes normal turgor, color, no bruising or petechiae. PSYCH: Oriented, good eye contact,cognition and judgment appear normal. LN: no cervical axillary  adenopathy  Lab Results  Component Value Date   WBC 8.1 01/18/2019   HGB 13.5 01/18/2019   HCT 40.3 01/18/2019   PLT 247.0 01/18/2019   GLUCOSE 87 01/18/2019   CHOL 197 04/08/2018   TRIG 139.0 04/08/2018   HDL 63.50 04/08/2018   LDLDIRECT 125.8 02/23/2013   LDLCALC 105 (H) 04/08/2018   ALT 20 04/08/2018   AST 30 04/08/2018   NA 134 (L) 01/18/2019   K 3.7 01/18/2019   CL 97 01/18/2019   CREATININE 0.89 01/18/2019   BUN 9 01/18/2019   CO2 27 01/18/2019   TSH 3.670 05/28/2017    ASSESSMENT AND PLAN:  Discussed the following assessment and  plan:  Visit for preventive health examination  Otalgia of both ears - Plan: Basic metabolic panel, CBC with Differential, Hepatic function panel, Lipid panel, TSH, Sed Rate (ESR), Ambulatory referral to ENT  Neck pain on right side - Plan: Basic metabolic panel, CBC with Differential, Hepatic function panel, Lipid panel, TSH, Sed Rate (ESR)  Medication management - Plan: Basic metabolic panel, CBC with Differential, Hepatic function panel, Lipid panel, TSH, Sed Rate (ESR)  Hyperlipidemia, unspecified hyperlipidemia type - Plan: Basic metabolic panel, CBC with Differential, Hepatic function panel, Lipid panel, TSH, Sed Rate (ESR)  Allergic rhinitis, unspecified seasonality, unspecified trigger - Plan: Basic metabolic panel, CBC with Differential, Hepatic function panel, Lipid panel, TSH, Sed Rate (ESR)  Anxiety - Plan: Basic metabolic panel, CBC with Differential, Hepatic function panel, Lipid panel, TSH, Sed Rate (ESR)  Chronic anticoagulation - for paf  - Plan: Basic metabolic panel, CBC with Differential, Hepatic function panel, Lipid panel, TSH, Sed Rate (ESR), Ambulatory referral to ENT  Suspected urinary tract infection - Plan: POC Urinalysis Dipstick  Nasal sinus congestion - Plan: Ambulatory referral to ENT  Deviated septum - Plan: Ambulatory referral to ENT   Ear pain that she describes as the most problematic feels she got some relief with a shot of steroids and asks if she should have another one.  This may be referred pain from her sinuses on the right uncertain agree with seeing ENT for good ear exam sinus exam and options for treatment.  Of note she had nosebleeds when was on Flonase previously. At this point she is just taking Claritin but did suggest adding steam treatment for dryness in the short run. Did make aware of  her last bone density showing osteoporosis but not sure she is interested in medication although may have been on one in the past.  The pain she is having  is most concerning to her today .  I am not aware of a fracture.  Information about weightbearing and vitamin D for bone health. Recurrent steroid use is at risk to reduce her bone strength however if needed would be appropriate but would rather have [place] definition and treatment plan. Patient Care Team: Malillany Kazlauskas, Standley Brooking, MD as PCP - General Angelena Form Annita Brod, MD as PCP - Cardiology (Cardiology) Bjorn Loser, MD (Urology) Particia Nearing, MD (Dermatology) Luberta Mutter, MD (Ophthalmology) Almedia Balls, MD (Orthopedic Surgery) Ouida Sills  (Rheumatology) Jarome Matin, MD as Consulting Physician (Dermatology)  Patient Instructions  Your ear drums look normal  Not sure why you hare having such pain  The neck pain could be arthritis .   Will do a referral to ENT for a good exam and opinion.  sometimes  Referred pain or sinus issue.   Get the covid vaccine when available  To you.   Will notify you  of labs when available.   Tread mill ok if you can walk and balance is ok to hold on.    Eating Plan for Osteoporosis Osteoporosis causes your bones to become weak and brittle. This puts you at greater risk for bone breaks (fractures) from small bumps or falls. Making changes to your diet and increasing your physical activity can help strengthen your bones and improve your overall health. Calcium and vitamin D are nutrients that play an important role in bone health. Vitamin D helps your body use calcium and strengthen bones. Therefore, it is important to get enough calcium and vitamin D as part of your eating plan for osteoporosis. What are tips for following this plan? Reading food labels  Try to get at least 1,000 milligrams (mg) of calcium each day.  Look for foods that have at least 50 mg of calcium per serving.  Talk with your health care provider about taking a calcium supplement if you do not get enough calcium from food.  Do not have more than 2,500 mg of calcium each  day. This is the upper limit for food and nutritional supplements combined. Too much calcium may cause constipation and prevent you from absorbing other important nutrients.  Choose foods that contain vitamin D.  Take a daily vitamin supplement that contains 800-1,000 international units (IU) of vitamin D. The amount may be different depending on your age, body weight, ethnicity, and where you live. Talk with your dietitian or health care provider about how much vitamin D is right for you.  Avoid foods that have more than 300 mg of sodium per serving. Too much sodium can cause your body to lose calcium.  Talk with your dietitian or health care provider about how much sodium you are allowed each day. Shopping  Do not buy foods with added salt, including: ? Salted snacks. ? Angie Fava. ? Canned soups. ? Canned meats. ? Processed meats, such as bacon or cold cuts. ? Smoked fish. Meal planning  Eat balanced meals that contain protein foods, fruits and vegetables, and foods rich in calcium and vitamin D.  Eat at least 5 servings of fruits and vegetables each day.  Eat 5-6 oz. of lean meat, poultry, fish, eggs, or beans each day. Lifestyle  Do not use any products that contain nicotine or tobacco, such as cigarettes and e-cigarettes. If you need help quitting, ask your health care provider.  If your health care provider recommends that you lose weight: ? Work with a dietitian to develop an eating plan that will help you reach your desired weight goal. ? Exercise  for at least 30 minutes a day, 5 or more days a week, or as told by your health care provider.  Work with a physical therapist to develop an exercise plan that includes flexibility, balance, and strength exercises.  If you drink alcohol, limit how much you have. This means: ? 0-1 drink a day for women. ? 0-2 drinks a day for men. ? Be aware of how much alcohol is in your drink. In the [place] one drink equals one typical bottle of  beer (12 oz), one-half glass of wine (5 oz), or one shot of hard liquor (1 oz). What foods should I eat? Foods high in calcium   Yogurt. Yogurt with fruit.  Milk. Evaporated skim milk. Dry milk powder.  Calcium-fortified orange juice.  Parmesan cheese. Part-skim ricotta cheese. Natural hard cheese. Cream cheese. Cottage cheese.  Canned sardines. Canned salmon.  Calcium-treated tofu. Calcium-fortified cereal bar. Calcium-fortified cereal. Calcium-fortified graham crackers.  Cooked collard greens. Turnip greens. Broccoli. Kale.  Almonds.  White beans.  Corn tortilla. Foods high in vitamin D  Cod liver oil. Fatty fish, such as tuna, mackerel, and salmon.  Milk. Fortified soy milk. Fortified fruit juice.  Yogurt. Margarine.  Egg yolks. Foods high in protein  Beef. Lamb. Pork tenderloin.  Chicken breast.  Tuna (canned). Fish fillet.  Tofu.  Soy beans (cooked). Soy patty. Beans (canned or cooked).  Cottage cheese.  Yogurt.  Peanut butter.  Pumpkin seeds. Nuts. Sunflower seeds.  Hard cheese.  Milk or other milk products, such as soy milk. The items listed above may not be a complete list of foods and beverages you can eat. Contact a dietitian for more options. Summary  Calcium and vitamin D are nutrients that play an important role in bone health and are an important part of your eating plan for osteoporosis.  Eat balanced meals that contain protein foods, fruits and vegetables, and foods rich in calcium and vitamin D.  Avoid foods that have more than 300 mg of sodium per serving. Too much sodium can cause your body to lose calcium.  Exercise is an important part of prevention and treatment of osteoporosis. Aim for at least 30 minutes a day, 5 days a week. This information is not intended to replace advice given to you by your health care provider. Make sure you discuss any questions you have with your health care provider. Document Revised: 05/26/2017  Document Reviewed: 05/26/2017 Elsevier Patient Education  2020 [place] Tarrie Mcmichen M.D.

## 2019-04-07 NOTE — Patient Instructions (Signed)
Your ear drums look normal  Not sure why you hare having such pain  The neck pain could be arthritis .   Will do a referral to ENT for a good exam and opinion.  sometimes  Referred pain or sinus issue.   Get the covid vaccine when available  To you.   Will notify you  of labs when available.   Tread mill ok if you can walk and balance is ok to hold on.    Eating Plan for Osteoporosis Osteoporosis causes your bones to become weak and brittle. This puts you at greater risk for bone breaks (fractures) from small bumps or falls. Making changes to your diet and increasing your physical activity can help strengthen your bones and improve your overall health. Calcium and vitamin D are nutrients that play an important role in bone health. Vitamin D helps your body use calcium and strengthen bones. Therefore, it is important to get enough calcium and vitamin D as part of your eating plan for osteoporosis. What are tips for following this plan? Reading food labels  Try to get at least 1,000 milligrams (mg) of calcium each day.  Look for foods that have at least 50 mg of calcium per serving.  Talk with your health care provider about taking a calcium supplement if you do not get enough calcium from food.  Do not have more than 2,500 mg of calcium each day. This is the upper limit for food and nutritional supplements combined. Too much calcium may cause constipation and prevent you from absorbing other important nutrients.  Choose foods that contain vitamin D.  Take a daily vitamin supplement that contains 800-1,000 international units (IU) of vitamin D. The amount may be different depending on your age, body weight, ethnicity, and where you live. Talk with your dietitian or health care provider about how much vitamin D is right for you.  Avoid foods that have more than 300 mg of sodium per serving. Too much sodium can cause your body to lose calcium.  Talk with your dietitian or health care  provider about how much sodium you are allowed each day. Shopping  Do not buy foods with added salt, including: ? Salted snacks. ? Angie Fava. ? Canned soups. ? Canned meats. ? Processed meats, such as bacon or cold cuts. ? Smoked fish. Meal planning  Eat balanced meals that contain protein foods, fruits and vegetables, and foods rich in calcium and vitamin D.  Eat at least 5 servings of fruits and vegetables each day.  Eat 5-6 oz. of lean meat, poultry, fish, eggs, or beans each day. Lifestyle  Do not use any products that contain nicotine or tobacco, such as cigarettes and e-cigarettes. If you need help quitting, ask your health care provider.  If your health care provider recommends that you lose weight: ? Work with a dietitian to develop an eating plan that will help you reach your desired weight goal. ? Exercise for at least 30 minutes a day, 5 or more days a week, or as told by your health care provider.  Work with a physical therapist to develop an exercise plan that includes flexibility, balance, and strength exercises.  If you drink alcohol, limit how much you have. This means: ? 0-1 drink a day for women. ? 0-2 drinks a day for men. ? Be aware of how much alcohol is in your drink. In the [place] one drink equals one typical bottle of beer (12 oz), one-half glass of wine (5  oz), or one shot of hard liquor (1 oz). What foods should I eat? Foods high in calcium   Yogurt. Yogurt with fruit.  Milk. Evaporated skim milk. Dry milk powder.  Calcium-fortified orange juice.  Parmesan cheese. Part-skim ricotta cheese. Natural hard cheese. Cream cheese. Cottage cheese.  Canned sardines. Canned salmon.  Calcium-treated tofu. Calcium-fortified cereal bar. Calcium-fortified cereal. Calcium-fortified graham crackers.  Cooked collard greens. Turnip greens. Broccoli. Kale.  Almonds.  White beans.  Corn tortilla. Foods high in vitamin D  Cod liver oil. Fatty fish, such as  tuna, mackerel, and salmon.  Milk. Fortified soy milk. Fortified fruit juice.  Yogurt. Margarine.  Egg yolks. Foods high in protein  Beef. Lamb. Pork tenderloin.  Chicken breast.  Tuna (canned). Fish fillet.  Tofu.  Soy beans (cooked). Soy patty. Beans (canned or cooked).  Cottage cheese.  Yogurt.  Peanut butter.  Pumpkin seeds. Nuts. Sunflower seeds.  Hard cheese.  Milk or other milk products, such as soy milk. The items listed above may not be a complete list of foods and beverages you can eat. Contact a dietitian for more options. Summary  Calcium and vitamin D are nutrients that play an important role in bone health and are an important part of your eating plan for osteoporosis.  Eat balanced meals that contain protein foods, fruits and vegetables, and foods rich in calcium and vitamin D.  Avoid foods that have more than 300 mg of sodium per serving. Too much sodium can cause your body to lose calcium.  Exercise is an important part of prevention and treatment of osteoporosis. Aim for at least 30 minutes a day, 5 days a week. This information is not intended to replace advice given to you by your health care provider. Make sure you discuss any questions you have with your health care provider. Document Revised: 05/26/2017 Document Reviewed: 05/26/2017 Elsevier Patient Education  2020 [employer].

## 2019-04-08 LAB — LIPID PANEL
Cholesterol: 202 mg/dL — ABNORMAL HIGH (ref 0–200)
HDL: 67.3 mg/dL (ref >39.00)
NonHDL: 135.03
Total CHOL/HDL Ratio: 3
Triglycerides: 203 mg/dL — ABNORMAL HIGH (ref 0.0–149.0)
VLDL: 40.6 mg/dL — ABNORMAL HIGH (ref 0.0–40.0)

## 2019-04-08 LAB — CBC WITH DIFFERENTIAL/PLATELET
Basophils Absolute: 0.1 10*3/uL (ref 0.0–0.1)
Basophils Relative: 1.2 % (ref 0.0–3.0)
Eosinophils Absolute: 0.1 10*3/uL (ref 0.0–0.7)
Eosinophils Relative: 0.5 % (ref 0.0–5.0)
HCT: 39.3 % (ref 36.0–46.0)
Hemoglobin: 13.3 g/dL (ref 12.0–15.0)
Lymphocytes Relative: 18.2 % (ref 12.0–46.0)
Lymphs Abs: 1.7 10*3/uL (ref 0.7–4.0)
MCHC: 33.7 g/dL (ref 30.0–36.0)
MCV: 95.9 fl (ref 78.0–100.0)
Monocytes Absolute: 0.6 10*3/uL (ref 0.1–1.0)
Monocytes Relative: 6.7 % (ref 3.0–12.0)
Neutro Abs: 7 10*3/uL (ref 1.4–7.7)
Neutrophils Relative %: 73.4 % (ref 43.0–77.0)
Platelets: 241 10*3/uL (ref 150.0–400.0)
RBC: 4.1 Mil/uL (ref 3.87–5.11)
RDW: 13 % (ref 11.5–15.5)
WBC: 9.5 10*3/uL (ref 4.0–10.5)

## 2019-04-08 LAB — HEPATIC FUNCTION PANEL
ALT: 18 U/L (ref 0–35)
AST: 19 U/L (ref 0–37)
Albumin: 4.2 g/dL (ref 3.5–5.2)
Alkaline Phosphatase: 54 U/L (ref 39–117)
Bilirubin, Direct: 0.1 mg/dL (ref 0.0–0.3)
Total Bilirubin: 0.5 mg/dL (ref 0.2–1.2)
Total Protein: 6.8 g/dL (ref 6.0–8.3)

## 2019-04-08 LAB — LDL CHOLESTEROL, DIRECT: Direct LDL: 100 mg/dL

## 2019-04-08 LAB — BASIC METABOLIC PANEL
BUN: 8 mg/dL (ref 6–23)
CO2: 25 mEq/L (ref 19–32)
Calcium: 9.2 mg/dL (ref 8.4–10.5)
Chloride: 96 mEq/L (ref 96–112)
Creatinine, Ser: 0.87 mg/dL (ref 0.40–1.20)
GFR: 61.92 mL/min (ref >60.00)
Glucose, Bld: 90 mg/dL (ref 70–99)
Potassium: 4.1 mEq/L (ref 3.5–5.1)
Sodium: 129 mEq/L — ABNORMAL LOW (ref 135–145)

## 2019-04-08 LAB — SEDIMENTATION RATE: Sed Rate: 23 mm/hr (ref 0–30)

## 2019-04-08 LAB — TSH: TSH: 2.39 u[IU]/mL (ref 0.35–4.50)

## 2019-04-08 NOTE — Progress Notes (Signed)
Kidney  liver function  thyroid is good  and no anemia   but sodium level has dropped some   again  this can be from medications like diuretics fluid pills (but dont see that on your med list . )  make sure not taking a diuretic  and  repeat   bmp in  1-2 weeks .   Also should consider  adding a medication for  bone building like actonel or fosamax  or prolia injections   .  When ready  make appt of phone  video to discuss this option.

## 2019-04-09 ENCOUNTER — Other Ambulatory Visit: Payer: Self-pay

## 2019-04-09 ENCOUNTER — Encounter: Payer: Self-pay | Admitting: Internal Medicine

## 2019-04-09 ENCOUNTER — Telehealth (INDEPENDENT_AMBULATORY_CARE_PROVIDER_SITE_OTHER): Payer: Medicare Other | Admitting: Internal Medicine

## 2019-04-09 DIAGNOSIS — E2839 Other primary ovarian failure: Secondary | ICD-10-CM | POA: Diagnosis not present

## 2019-04-09 DIAGNOSIS — M810 Age-related osteoporosis without current pathological fracture: Secondary | ICD-10-CM

## 2019-04-09 DIAGNOSIS — E871 Hypo-osmolality and hyponatremia: Secondary | ICD-10-CM

## 2019-04-09 NOTE — Progress Notes (Signed)
Virtual Visit via Telephone Note  I connected with@ on 04/09/19 at  1:30 PM EST by telephone and verified that I am speaking with the correct person using two identifiers.   I discussed the limitations, risks, security and privacy concerns of performing an evaluation and management service by telephone and the limited availability of in person appointments. tThere may be a patient responsible charge related to this service. The patient expressed understanding and agreed to proceed.  Location patient: home Location provider: work office Participants present for the call: patient, provider and daughter  Patient did not have a visit in the prior 7 days to address this/these issue(s).   History of Present Illness: Ashley Lawson presents to discuss her bone health. She had a bone density about 2+ years ago that showed osteoporosis in the -2.6 range T score without history of personal fracture.  Her mom had a history of hip fracture or surgery in her 23s. She had been offered Prolia a year ago but after reading potential side effects she and her daughter decided not to proceed. She is trying to do weightbearing exercises and is taking calcium vitamin D and a multivitamin. Record review noted in about 2010 she had 5 doses of weekly Fosamax and had some GI disturbance and asked to stop the plan was to perhaps try monthly risedronate but this never happened.  She is still battling problems with her ears and feels like her hearing is muffled does have a ENT appointment next week in regard to her ear pain.  Of note she was noted to have a low sodium on her's screening chemistries and this is to be repeated. Observations/Objective: Patient sounds personable and well on the phone. I do not appreciate any SOB. Speech and thought processing are grossly intact. Patient reported vitals: Lab Results  Component Value Date   WBC 9.5 04/07/2019   HGB 13.3 04/07/2019   HCT 39.3 04/07/2019   PLT 241.0  04/07/2019   GLUCOSE 90 04/07/2019   CHOL 202 (H) 04/07/2019   TRIG 203.0 (H) 04/07/2019   HDL 67.30 04/07/2019   LDLDIRECT 100.0 04/07/2019   LDLCALC 105 (H) 04/08/2018   ALT 18 04/07/2019   AST 19 04/07/2019   NA 129 (L) 04/07/2019   K 4.1 04/07/2019   CL 96 04/07/2019   CREATININE 0.87 04/07/2019   BUN 8 04/07/2019   CO2 25 04/07/2019   TSH 2.39 04/07/2019    Assessment and Plan:  Osteoporosis by DEXA parental history of hip fracture I believe Transient intolerance to short-term weekly Fosamax 10+ years ago declines Prolia at this time It is reasonable to repeat her DEXA at this time and optimize and check for her vitamin D level and then go from there Would not rule out monthly risedronate or infusion and or consultation with endocrinologist However at this time what is most problematic for her is her ear pain sinus pressure and possibly decreased hearing affect she has had recently of note she has had a number of times where she has been on steroids.  Follow Up Instructions:  Make appointment for bone density we will add vitamin D level to your routine labs follow-up for low sodium.   [id number] [id number] [id number] 11-20 [id number] 21-30 I did not refer this patient for an OV in the next 24 hours for this/these issue(s).  I discussed the assessment and treatment plan with the patient. The patient was provided an opportunity to ask questions and answered. The patient agreed with  the plan and demonstrated an understanding of the instructions.   The patient was advised to call back or seek an in-person evaluation if the symptoms worsen or if the condition fails to improve as anticipated.  I provided 74mnutes of non-face-to-face time during this encounter. No follow-ups on file.  WShanon Ace MD

## 2019-04-13 ENCOUNTER — Ambulatory Visit (INDEPENDENT_AMBULATORY_CARE_PROVIDER_SITE_OTHER): Payer: Medicare Other | Admitting: Otolaryngology

## 2019-04-13 ENCOUNTER — Telehealth: Payer: Self-pay | Admitting: Internal Medicine

## 2019-04-13 MED ORDER — PREDNISONE 50 MG PO TABS: ORAL_TABLET | ORAL | 0 refills | Status: DC

## 2019-04-13 NOTE — Telephone Encounter (Signed)
Oh no... not sure what to use .  Since she responded to the steroids  And be sure no fever   If so can send in prednisone 50 mg( steroid)   Take 1 po qd for  5  days  Disp 5   When can she be seen again?    Do we need to get her in with different ENT?  Other group?

## 2019-04-13 NOTE — Telephone Encounter (Signed)
Please advise 

## 2019-04-13 NOTE — Telephone Encounter (Signed)
Message Routed to PCP CMA 

## 2019-04-13 NOTE — Telephone Encounter (Signed)
Pt states no fever and sent medication in

## 2019-04-13 NOTE — Telephone Encounter (Signed)
Pt stated that Dr. Lucia Gaskins ENT had to cancel pt's appt due to him testing positive for Covid 19. She would like to know if Dr. Regis Bill could send a rx for her ears to help until she is able to see Dr. Lucia Gaskins again. Please advise.

## 2019-04-15 ENCOUNTER — Other Ambulatory Visit: Payer: Self-pay | Admitting: Internal Medicine

## 2019-04-16 NOTE — Telephone Encounter (Signed)
Last ov:04/09/2019 Last filled:03/30/2019

## 2019-04-21 ENCOUNTER — Ambulatory Visit (INDEPENDENT_AMBULATORY_CARE_PROVIDER_SITE_OTHER)
Admission: RE | Admit: 2019-04-21 | Discharge: 2019-04-21 | Disposition: A | Discharge status: Home / Self Care | Payer: Medicare Other | Source: Ambulatory Visit | Attending: Internal Medicine | Admitting: Internal Medicine

## 2019-04-21 ENCOUNTER — Other Ambulatory Visit: Payer: Self-pay

## 2019-04-21 DIAGNOSIS — E2839 Other primary ovarian failure: Secondary | ICD-10-CM | POA: Diagnosis not present

## 2019-04-21 DIAGNOSIS — M810 Age-related osteoporosis without current pathological fracture: Secondary | ICD-10-CM

## 2019-04-22 ENCOUNTER — Ambulatory Visit (INDEPENDENT_AMBULATORY_CARE_PROVIDER_SITE_OTHER): Payer: Medicare Other | Admitting: Otolaryngology

## 2019-04-26 ENCOUNTER — Ambulatory Visit (INDEPENDENT_AMBULATORY_CARE_PROVIDER_SITE_OTHER): Payer: Medicare Other | Admitting: Otolaryngology

## 2019-04-26 ENCOUNTER — Other Ambulatory Visit: Payer: Self-pay

## 2019-04-26 ENCOUNTER — Encounter (INDEPENDENT_AMBULATORY_CARE_PROVIDER_SITE_OTHER): Payer: Self-pay | Admitting: Otolaryngology

## 2019-04-26 VITALS — Temp 97.2°F

## 2019-04-26 DIAGNOSIS — H903 Sensorineural hearing loss, bilateral: Secondary | ICD-10-CM | POA: Diagnosis not present

## 2019-04-26 DIAGNOSIS — M26609 Unspecified temporomandibular joint disorder, unspecified side: Secondary | ICD-10-CM | POA: Diagnosis not present

## 2019-04-26 DIAGNOSIS — J310 Chronic rhinitis: Secondary | ICD-10-CM

## 2019-04-26 NOTE — Progress Notes (Signed)
HPI: Ashley Lawson is a 86 y.o. female who presents is referred by Dr. Regis Bill for evaluation of ear complaints and sinus complaints.  She describes pressure pain discomfort in her ears as well as drainage from her nose and sinuses.  She has been on antibiotics as well as prednisone with minimal benefit.  She has had previous normal ear exams.  I saw her 2 years with similar complaints and had a CT scan of her sinuses which was clear at that time. She presently does not describe any colored discharge from her nose mostly just clear mucus.  But she describes pressure in her ears and discomfort in the ears. She is on Eliquis and has only been taking Tylenol for pain and discomfort..  Past Medical History:  Diagnosis Date  . Abdominal pain 12/2009   hospitalized  . Allergic rhinitis   . Chronic constipation   . Compression fx, lumbar spine ([place])   . DDD (degenerative disc disease), lumbar   . Diverticulosis of colon   . Dog bite(E906.0) 08/30/2011  . GERD (gastroesophageal reflux disease)   . Hepatic steatosis   . Hyperlipidemia   . Hyperplastic colon polyp   . IBS (irritable bowel syndrome)    constipation predominant  . Internal hemorrhoids   . Osteoporosis    dexa 2011 -2.7 hip nl spine  . PAF (paroxysmal atrial fibrillation) ([place])    a. dx 05/2017.  Marland Kitchen PMR (polymyalgia rheumatica) ([facility]) 08/30/2011   under rheum care  on low dose pred 5 mg   . Renal disorder    Past Surgical History:  Procedure Laterality Date  . ABDOMINAL HYSTERECTOMY     with bladder tact and rectocele repair  . CATARACT EXTRACTION  2010   both  . COLONOSCOPY  2016 was most recent   Social History   Socioeconomic History  . Marital status: Widowed    Spouse name: Not on file  . Number of children: Not on file  . Years of education: Not on file  . Highest education level: Not on file  Occupational History  . Occupation: retired  Tobacco Use  . Smoking status: Never Smoker  . Smokeless tobacco: Never  Used  Substance and Sexual Activity  . Alcohol use: Yes    Alcohol/week: 0.0 standard drinks    Comment: occ  . Drug use: No  . Sexual activity: Not on file  Other Topics Concern  . Not on file  Social History Narrative   Retired   Regular exercise- yes   Widowed   At home with children and GKs    HH of 2     Puppy   Pl[employer] cards is social and active    Social Determinants of Health   Financial Resource Strain:   . Difficulty of Paying Living Expenses: Not on file  Food Insecurity:   . Worried About Charity fundraiser in the Last Year: Not on file  . Ran Out of Food in the Last Year: Not on file  Transportation Needs:   . Lack of Transportation (Medical): Not on file  . Lack of Transportation (Non-Medical): Not on file  Physical Activity:   . Days of Exercise per Week: Not on file  . Minutes of Exercise per Session: Not on file  Stress:   . Feeling of Stress : Not on file  Social Connections:   . Frequency of Communication with Friends and Family: Not on file  . Frequency of Social Gatherings with Friends and Family:  Not on file  . Attends Religious Services: Not on file  . Active Member of Clubs or Organizations: Not on file  . Attends Archivist Meetings: Not on file  . Marital Status: Not on file   Family History  Problem Relation Age of Onset  . Other Mother        fx hip  . Other Sister        fx hip  . Bladder Cancer Brother   . Colon cancer Neg Hx   . Esophageal cancer Neg Hx   . Stomach cancer Neg Hx   . Rectal cancer Neg Hx   . Liver cancer Neg Hx    Allergies  Allergen Reactions  . Alendronate Sodium     upset stomach  . Penicillins     Has patient had a PCN reaction causing immediate rash, facial/tongue/throat swelling, SOB or lightheadedness with hypotension: Yes Has patient had a PCN reaction causing severe rash involving mucus membranes or skin necrosis: No Has patient had a PCN reaction that required hospitalization: No Has  patient had a PCN reaction occurring within the last 10 years: No If all of the above answers are "NO", then may proceed with Cephalosporin use.   Marland Kitchen Amoxicillin Rash    Has patient had a PCN reaction causing immediate rash, facial/tongue/throat swelling, SOB or lightheadedness with hypotension: yes Has patient had a PCN reaction causing severe rash involving mucus membranes or skin necrosis: no Has patient had a PCN reaction that required hospitalization: no Has patient had PCN reaction within the last 10 years: no  If all of the above answers are "NO", then may proceed with Cephalosporin use.  REACTION: unspecified  . Metronidazole Other (See Comments)    Pounding headaches patient says was bad.  With cipro for diverticulitis rx. Other reaction(s): Other (See Comments) Pounding headaches patient says was bad.With cipro for diverticulitis rx.   Prior to Admission medications   Medication Sig Start Date End Date Taking? Authorizing Provider  acetaminophen (TYLENOL) 500 MG tablet Take 500 mg by mouth every 6 (six) hours as needed for mild pain, moderate pain or headache.   Yes [provider]  ALPRAZolam Duanne Moron) 0.5 MG tablet Take 0.5 mg by mouth at bedtime as needed for anxiety.   Yes [provider]  AMBULATORY NON FORMULARY MEDICATION Medication Name: Diltiazem 2% with lidocaine 5% Insert a pea size amount into rectum to the first knuckle TID 06/12/17  Yes Gatha Mayer, MD  Ascorbic Acid (VITAMIN C) 100 MG tablet Take 100 mg by mouth daily.     Yes [provider]  calcium carbonate (OS-CAL) 1250 (500 Ca) MG chewable tablet Chew 1 tablet by mouth 2 (two) times daily.   Yes [provider]  conjugated estrogens (PREMARIN) vaginal cream Place 1 Applicatorful vaginally daily as needed (dryness).   Yes [provider]  diltiazem (CARDIZEM CD) 120 MG 24 hr capsule TAKE 1 CAPSULE BY MOUTH EVERY DAY 12/21/18  Yes Simmons, Brittainy M, PA-C  ELIQUIS  5 MG TABS tablet TAKE 1 TABLET BY MOUTH TWICE A DAY 11/10/18  Yes Burnell Blanks, MD  Famotidine (PEPCID AC PO) Take 1 tablet by mouth as needed.   Yes [provider]  fluticasone (FLONASE) 50 MCG/ACT nasal spray Place 2 sprays into both nostrils daily.   Yes [provider]  guaiFENesin (MUCINEX) 600 MG 12 hr tablet Take 600 mg by mouth 2 (two) times daily as needed (allergies).   Yes  [provider]  loratadine (CLARITIN) 10 MG tablet Take 10 mg by mouth daily as needed for allergies.   Yes [provider]  LORazepam (ATIVAN) 0.5 MG tablet TAKE 1-2 TABLETS BY MOUTH 2 TIMES DAILY AS NEEDED FOR ANXIETY. AVOID REGULAR USE 04/16/19  Yes Panosh, Standley Brooking, MD  magnesium hydroxide (MILK OF MAGNESIA) 400 MG/5ML suspension Take 15 mLs by mouth daily as needed for mild constipation.   Yes [provider]  MULTIPLE VITAMIN PO Take 1 tablet by mouth daily.    Yes [provider]  pantoprazole (PROTONIX) 40 MG tablet Take 1 tablet (40 mg total) by mouth daily. Take one 30-60 minutes before breakfast 01/18/19  Yes Lemmon, Lavone Nian, PA  polyethylene glycol (MIRALAX / GLYCOLAX) packet Take 17 g by mouth daily.   Yes [provider]  predniSONE (DELTASONE) 50 MG tablet Use once daily 04/13/19  Yes Panosh, Standley Brooking, MD  Probiotic Product (ALIGN) 4 MG CAPS Take 1 capsule by mouth daily.   Yes [provider]  RESTASIS 0.05 % ophthalmic emulsion Place 1 drop into both eyes 2 (two) times daily. 10/26/14  Yes [provider]     Positive ROS: Is otherwise negative.  All other systems have been reviewed and were otherwise negative with the exception of those mentioned in the HPI and as above.  Physical Exam: Constitutional: Alert, well-appearing, no acute distress Ears: External ears without lesions or tenderness. Ear canals are clear bilaterally with intact, clear TMs.  On hearing screening with a 512 1024 tuning fork she  has moderate bilateral symmetric sensorineural hearing loss with AC > BC bilaterally. Nasal: External nose without lesions. Septum with minimal deformity.  Moderate rhinitis with clear mucus discharge.. Clear nasal passages.  No polyps. Nasal endoscopy was performed in the office today and this demonstrated clear middle meatus bilaterally.  Minimal edema.  No polyps.  No mucopurulent discharge.  Both eustachian tubes were clear. Oral: Lips and gums without lesions. Tongue and palate mucosa without lesions. Posterior oropharynx clear. Neck: No palpable adenopathy or masses.  She did have some mild discomfort over the TMJ regions bilaterally on opening closing her mouth.  But no swelling or palpable masses. Respiratory: Breathing comfortably  Skin: No facial/neck lesions or rash noted.  Nasal/sinus endoscopy  Date/Time: 04/26/2019 5:13 PM Performed by: Rozetta Nunnery, MD Authorized by: Rozetta Nunnery, MD   Consent:    Consent obtained:  Verbal   Consent given by:  Patient   Risks discussed:  Pain Procedure details:    Indications: sino-nasal symptoms     Medication:  Afrin   Instrument: flexible fiberoptic nasal endoscope     Scope location: bilateral nare   Sinus:    patent     Left middle meatus: normal     patent     Right nasopharynx: normal     patent     Left nasopharynx: normal     Right Eustachian tube orifices: normal     Left Eustachian tube orifices: normal   Comments:     On sinonasal endoscopy both middle meatus as well as maxillary ostia are widely patent with no mucopurulent discharge noted.  Both eustachian tubes are clear.  She has minimal clear mucus discharge.    Assessment: Chronic rhinitis.  No clinical signs of infection. Discomfort probably related to TMJ dysfunction Bilateral SNHL  Plan: Suggested using Nasacort 2 sprays each nostril at night. Also suggested using Advil 400 mg 2 or 3 times  a day for the next 10 days and recommended a soft  diet. We will schedule follow-up with audiologic testing in 2 to 3 weeks.   Radene Journey, MD   CC:

## 2019-04-28 ENCOUNTER — Ambulatory Visit: Payer: Medicare Other

## 2019-05-07 ENCOUNTER — Ambulatory Visit: Payer: Medicare Other | Attending: Internal Medicine

## 2019-05-07 DIAGNOSIS — Z23 Encounter for immunization: Secondary | ICD-10-CM | POA: Insufficient documentation

## 2019-05-07 NOTE — Progress Notes (Signed)
   Covid-19 Vaccination Clinic  Name:  Ashley Lawson    MRN: [id number] DOB: Aug 26, 1932  05/07/2019  Ms. [name] was observed post Covid-19 immunization for 15 minutes without incidence. She was provided with Vaccine Information Sheet and instruction to access the V-Safe system.   Ms. [name] was instructed to call 911 with any severe reactions post vaccine: Marland Kitchen Difficulty breathing  . Swelling of your face and throat  . A fast heartbeat  . A bad rash all over your body  . Dizziness and weakness    Immunizations Administered    Name Date Dose VIS Date Route   Pfizer COVID-19 Vaccine 05/07/2019  8:55 AM 0.3 mL 03/12/2019 Intramuscular   Manufacturer: [place]   Lot: [id number]   [place]: [id number]

## 2019-05-17 ENCOUNTER — Other Ambulatory Visit: Payer: Self-pay | Admitting: Internal Medicine

## 2019-05-17 NOTE — Telephone Encounter (Signed)
Last Rx given on 04/16/19 for #20 with no ref

## 2019-05-19 ENCOUNTER — Ambulatory Visit: Payer: Medicare Other

## 2019-05-20 NOTE — Progress Notes (Signed)
Bone density shows osteoporosis and increase  risk of fracture .  We can try a different med than th fosomax  Considers actonel 150 mg taken once a month  let us  know if you want to try this medication.

## 2019-05-21 ENCOUNTER — Ambulatory Visit (INDEPENDENT_AMBULATORY_CARE_PROVIDER_SITE_OTHER): Payer: Medicare Other | Admitting: Otolaryngology

## 2019-05-28 ENCOUNTER — Other Ambulatory Visit: Payer: Self-pay | Admitting: Cardiovascular Disease

## 2019-05-28 NOTE — Telephone Encounter (Signed)
Pt last saw [place], PPA on 10/21/18, last labs 04/07/19 Creat 0.87, age 86, weight 74.5kg, based on specified criteria pt is on appropriate dosage of Eliquis 56m BID.  Will refill rx.

## 2019-05-31 ENCOUNTER — Other Ambulatory Visit: Payer: Self-pay | Admitting: Internal Medicine

## 2019-05-31 DIAGNOSIS — Z1231 Encounter for screening mammogram for malignant neoplasm of breast: Secondary | ICD-10-CM

## 2019-06-02 ENCOUNTER — Ambulatory Visit: Payer: Medicare Other | Attending: Internal Medicine

## 2019-06-02 DIAGNOSIS — Z23 Encounter for immunization: Secondary | ICD-10-CM | POA: Insufficient documentation

## 2019-06-02 NOTE — Progress Notes (Signed)
   Covid-19 Vaccination Clinic  Name:  Ashley Lawson    MRN: [id number] DOB: [birth year]  06/02/2019  [name] was observed post Covid-19 immunization for 15 minutes without incident. She was provided with Vaccine Information Sheet and instruction to access the V-Safe system.   [name] was instructed to call 911 with any severe reactions post vaccine: Marland Kitchen Difficulty breathing  . Swelling of face and throat  . A fast heartbeat  . A bad rash all over body  . Dizziness and weakness   Immunizations Administered    Name Date Dose VIS Date Route   Pfizer COVID-19 Vaccine 06/02/2019 12:37 PM 0.3 mL 03/12/2019 Intramuscular   Manufacturer: [place]   Lot: [id number]   [place]: [id number]

## 2019-06-04 ENCOUNTER — Other Ambulatory Visit: Payer: Self-pay

## 2019-06-04 ENCOUNTER — Ambulatory Visit: Payer: Medicare Other | Admitting: Cardiovascular Disease

## 2019-06-04 ENCOUNTER — Encounter: Payer: Self-pay | Admitting: Cardiovascular Disease

## 2019-06-04 VITALS — BP 122/80 | HR 64 | Ht 62.0 in | Wt 164.6 lb

## 2019-06-04 DIAGNOSIS — I480 Paroxysmal atrial fibrillation: Secondary | ICD-10-CM

## 2019-06-04 DIAGNOSIS — I340 Nonrheumatic mitral (valve) insufficiency: Secondary | ICD-10-CM

## 2019-06-04 NOTE — Progress Notes (Signed)
Chief Complaint  Patient presents with  . Follow-up    PAF    History of Present Illness: 86 yo female with history of GERD, diverticulosis, IBS, HLD, PMR and paroxysmal atrial fibrillation who is here today for follow up. She was admitted to [facility] in January 2019 with a UTI and sinus infection and was found to be in atrial fibrillation. She was started on a beta blocker and Eliquis and converted to sinus. She was treated for hyponatremia. Echo 05/02/17 with LVEF=55-60%, mild MR. TSH was normal. She was seen in our office for follow up 05/2717 by Melina Copa, PA-C and was doing well but having some fatigue. Her beta blocker was stopped and Cardizem was started. She was admitted to [facility] June 2020 with vertigo and was treated with meclizine. She was in sinus.   She is here today for follow up. The patient denies any chest pain, dyspnea, palpitations, lower extremity edema, orthopnea, PND, dizziness, near syncope or syncope.   Primary Care Physician: Burnis Medin, MD  Past Medical History:  Diagnosis Date  . Abdominal pain 12/2009   hospitalized  . Allergic rhinitis   . Chronic constipation   . Compression fx, lumbar spine ([place])   . DDD (degenerative disc disease), lumbar   . Diverticulosis of colon   . Dog bite(E906.0) 08/30/2011  . GERD (gastroesophageal reflux disease)   . Hepatic steatosis   . Hyperlipidemia   . Hyperplastic colon polyp   . IBS (irritable bowel syndrome)    constipation predominant  . Internal hemorrhoids   . Osteoporosis    dexa 2011 -2.7 hip nl spine  . PAF (paroxysmal atrial fibrillation) ([place])    a. dx 05/2017.  Marland Kitchen PMR (polymyalgia rheumatica) (HCC) 08/30/2011   under rheum care  on low dose pred 5 mg   . Renal disorder     Past Surgical History:  Procedure Laterality Date  . ABDOMINAL HYSTERECTOMY     with bladder tact and rectocele repair  . CATARACT EXTRACTION  2010   both  . COLONOSCOPY  2016 was most recent    Current Outpatient Medications    Medication Sig Dispense Refill  . acetaminophen (TYLENOL) 500 MG tablet Take 500 mg by mouth every 6 (six) hours as needed for mild pain, moderate pain or headache.    . ALPRAZolam (XANAX) 0.5 MG tablet Take 0.5 mg by mouth at bedtime as needed for anxiety.    . AMBULATORY NON FORMULARY MEDICATION Medication Name: Diltiazem 2% with lidocaine 5% Insert a pea size amount into rectum to the first knuckle TID 30 g 3  . Ascorbic Acid (VITAMIN C) 100 MG tablet Take 100 mg by mouth daily.      . calcium carbonate (OS-CAL) 1250 (500 Ca) MG chewable tablet Chew 1 tablet by mouth 2 (two) times daily.    . calcium-vitamin D (OSCAL WITH D) 500-200 MG-UNIT TABS tablet Take by mouth.    . conjugated estrogens (PREMARIN) vaginal cream Place 1 Applicatorful vaginally daily as needed (dryness).    Marland Kitchen diltiazem (CARDIZEM CD) 120 MG 24 hr capsule TAKE 1 CAPSULE BY MOUTH EVERY DAY 90 capsule 3  . ELIQUIS 5 MG TABS tablet TAKE 1 TABLET BY MOUTH TWICE A DAY 180 tablet 1  . fluticasone (FLONASE) 50 MCG/ACT nasal spray Place 2 sprays into both nostrils daily.    Marland Kitchen guaiFENesin (MUCINEX) 600 MG 12 hr tablet Take 600 mg by mouth 2 (two) times daily as needed (allergies).    Marland Kitchen  loratadine (CLARITIN) 10 MG tablet Take 10 mg by mouth daily as needed for allergies.    Marland Kitchen LORazepam (ATIVAN) 0.5 MG tablet TAKE 1-2 TABLETS BY MOUTH 2 TIMES DAILY AS NEEDED FOR ANXIETY. AVOID REGULAR USE 20 tablet 0  . magnesium hydroxide (MILK OF MAGNESIA) 400 MG/5ML suspension Take 15 mLs by mouth daily as needed for mild constipation.    . MULTIPLE VITAMIN PO Take 1 tablet by mouth daily.     . Omega-3 Fatty Acids (FISH OIL) 1000 MG CAPS Take by mouth.    . polyethylene glycol (MIRALAX / GLYCOLAX) packet Take 17 g by mouth daily.    . Probiotic Product (ALIGN) 4 MG CAPS Take 1 capsule by mouth daily.    . RESTASIS 0.05 % ophthalmic emulsion Place 1 drop into both eyes 2 (two) times daily.  3   No current facility-administered medications for  this visit.    Allergies  Allergen Reactions  . Alendronate Sodium     upset stomach  . Penicillins     Has patient had a PCN reaction causing immediate rash, facial/tongue/throat swelling, SOB or lightheadedness with hypotension: Yes Has patient had a PCN reaction causing severe rash involving mucus membranes or skin necrosis: No Has patient had a PCN reaction that required hospitalization: No Has patient had a PCN reaction occurring within the last 10 years: No If all of the above answers are "NO", then may proceed with Cephalosporin use.   Marland Kitchen Amoxicillin Rash    Has patient had a PCN reaction causing immediate rash, facial/tongue/throat swelling, SOB or lightheadedness with hypotension: yes Has patient had a PCN reaction causing severe rash involving mucus membranes or skin necrosis: no Has patient had a PCN reaction that required hospitalization: no Has patient had PCN reaction within the last 10 years: no  If all of the above answers are "NO", then may proceed with Cephalosporin use.  REACTION: unspecified  . Metronidazole Other (See Comments)    Pounding headaches patient says was bad.  With cipro for diverticulitis rx. Other reaction(s): Other (See Comments) Pounding headaches patient says was bad.With cipro for diverticulitis rx.    Social History   Socioeconomic History  . Marital status: Widowed    Spouse name: Not on file  . Number of children: Not on file  . Years of education: Not on file  . Highest education level: Not on file  Occupational History  . Occupation: retired  Tobacco Use  . Smoking status: Never Smoker  . Smokeless tobacco: Never Used  Substance and Sexual Activity  . Alcohol use: Yes    Alcohol/week: 0.0 standard drinks    Comment: occ  . Drug use: No  . Sexual activity: Not on file  Other Topics Concern  . Not on file  Social History Narrative   Retired   Regular exercise- yes   Widowed   At home with children and GKs    HH of 2      Puppy   Plays cards is social and active    Social Determinants of Health   Financial Resource Strain:   . Difficulty of Paying Living Expenses: Not on file  Food Insecurity:   . Worried About Charity fundraiser in the Last Year: Not on file  . Ran Out of Food in the Last Year: Not on file  Transportation Needs:   . Lack of Transportation (Medical): Not on file  . Lack of Transportation (Non-Medical): Not on file  Physical Activity:   .  Days of Exercise per Week: Not on file  . Minutes of Exercise per Session: Not on file  Stress:   . Feeling of Stress : Not on file  Social Connections:   . Frequency of Communication with Friends and Family: Not on file  . Frequency of Social Gatherings with Friends and Family: Not on file  . Attends Religious Services: Not on file  . Active Member of Clubs or Organizations: Not on file  . Attends Archivist Meetings: Not on file  . Marital Status: Not on file  Intimate Partner Violence:   . Fear of Current or Ex-Partner: Not on file  . Emotionally Abused: Not on file  . Physically Abused: Not on file  . Sexually Abused: Not on file    Family History  Problem Relation Age of Onset  . Other Mother        fx hip  . Other Sister        fx hip  . Bladder Cancer Brother   . Colon cancer Neg Hx   . Esophageal cancer Neg Hx   . Stomach cancer Neg Hx   . Rectal cancer Neg Hx   . Liver cancer Neg Hx     Review of Systems:  As stated in the HPI and otherwise negative.   BP 122/80   Pulse 64   Ht 5' 2"  (1.575 m)   Wt 164 lb 9.6 oz (74.7 kg)   SpO2 97%   BMI 30.11 kg/m   Physical Examination:  General: Well developed, well nourished, NAD  HEENT: OP clear, mucus membranes moist  SKIN: warm, dry. No rashes. Neuro: No focal deficits  Musculoskeletal: Muscle strength 5/5 all ext  Psychiatric: Mood and affect normal  Neck: No JVD, no carotid bruits, no thyromegaly, no lymphadenopathy.  Lungs:Clear bilaterally, no wheezes,  rhonci, crackles Cardiovascular: Regular rate and rhythm. No murmurs, gallops or rubs. Abdomen:Soft. Bowel sounds present. Non-tender.  Extremities: No lower extremity edema. Pulses are 2 + in the bilateral DP/PT.  EKG:  EKG is not ordered today. The ekg ordered today demonstrates   Echo February 2019:    Recent Labs: 04/07/2019: ALT 18; BUN 8; Creatinine, Ser 0.87; Hemoglobin 13.3; Platelets 241.0; Potassium 4.1; Sodium 129; TSH 2.39   Lipid Panel    Component Value Date/Time   CHOL 202 (H) 04/07/2019 1552   TRIG 203.0 (H) 04/07/2019 1552   HDL 67.30 04/07/2019 1552   CHOLHDL 3 04/07/2019 1552   VLDL 40.6 (H) 04/07/2019 1552   LDLCALC 105 (H) 04/08/2018 1019   LDLDIRECT 100.0 04/07/2019 1552     Wt Readings from Last 3 Encounters:  06/04/19 164 lb 9.6 oz (74.7 kg)  04/07/19 164 lb 3.2 oz (74.5 kg)  03/12/19 164 lb (74.4 kg)     Other studies Reviewed: Additional studies/ records that were reviewed today include: . Review of the above records demonstrates:    Assessment and Plan:   1. Paroxysmal atrial fibrillation: She appears to be in sinus today. No palpitations. Continue Cardizem and Eliquis.    2. Mitral regurgitation: Mild by echo 2019. Will repeat echo in February 2022.    Current medicines are reviewed at length with the patient today.  The patient does not have concerns regarding medicines.  The following changes have been made:  no change  Labs/ tests ordered today include:  No orders of the defined types were placed in this encounter.    Disposition:   FU with me in 12 months  Signed, Lauree Chandler, MD 06/04/2019 4:23 PM    [address], [place], [place]  [address] Phone: [phone]; Fax: [phone]

## 2019-06-04 NOTE — Patient Instructions (Signed)
Medication Instructions:  No changes *If you need a refill on your cardiac medications before your next appointment, please call your pharmacy*   Lab Work: none If you have labs (blood work) drawn today and your tests are completely normal, you will receive your results only by: Marland Kitchen MyChart Message (if you have MyChart) OR . A paper copy in the mail If you have any lab test that is abnormal or we need to change your treatment, we will call you to review the results.   Testing/Procedures: none   Follow-Up: At [facility], you and your health needs are our priority.  As part of our continuing mission to provide you with exceptional heart care, we have created designated Provider Care Teams.  These Care Teams include your primary Cardiologist (physician) and Advanced Practice Providers (APPs -  Physician Assistants and Nurse Practitioners) who all work together to provide you with the care you need, when you need it.  We recommend signing up for the patient portal called "MyChart".  Sign up information is provided on this After Visit Summary.  MyChart is used to connect with patients for Virtual Visits (Telemedicine).  Patients are able to view lab/test results, encounter notes, upcoming appointments, etc.  Non-urgent messages can be sent to your provider as well.   To learn more about what you can do with MyChart, go to [email or web address].    Your next appointment:   12 month(s)  The format for your next appointment:   Either In Person or Virtual  Provider:   Lauree Chandler, MD   Other Instructions

## 2019-06-11 ENCOUNTER — Ambulatory Visit (INDEPENDENT_AMBULATORY_CARE_PROVIDER_SITE_OTHER): Payer: Medicare Other | Admitting: Otolaryngology

## 2019-06-11 ENCOUNTER — Other Ambulatory Visit: Payer: Self-pay

## 2019-06-11 VITALS — Temp 97.5°F

## 2019-06-11 DIAGNOSIS — H903 Sensorineural hearing loss, bilateral: Secondary | ICD-10-CM

## 2019-06-11 DIAGNOSIS — J310 Chronic rhinitis: Secondary | ICD-10-CM | POA: Diagnosis not present

## 2019-06-11 DIAGNOSIS — J0101 Acute recurrent maxillary sinusitis: Secondary | ICD-10-CM

## 2019-06-11 NOTE — Progress Notes (Signed)
HPI: Ashley Lawson is a 86 y.o. female who returns today for evaluation of hearing.  Also reviewed with her concerning her chronic rhinitis and nasal sinus symptoms.  She complains of mostly intermittent pain in the right cheek sinus.  She has had previous surgery over 20 years ago.  She also has history of TMJ problems and does better using NSAIDs. Whenever she gets pain in the sinuses is generally in the right cheek region..  Past Medical History:  Diagnosis Date  . Abdominal pain 12/2009   hospitalized  . Allergic rhinitis   . Chronic constipation   . Compression fx, lumbar spine ([place])   . DDD (degenerative disc disease), lumbar   . Diverticulosis of colon   . Dog bite(E906.0) 08/30/2011  . GERD (gastroesophageal reflux disease)   . Hepatic steatosis   . Hyperlipidemia   . Hyperplastic colon polyp   . IBS (irritable bowel syndrome)    constipation predominant  . Internal hemorrhoids   . Osteoporosis    dexa 2011 -2.7 hip nl spine  . PAF (paroxysmal atrial fibrillation) ([place])    a. dx 05/2017.  Marland Kitchen PMR (polymyalgia rheumatica) (HCC) 08/30/2011   under rheum care  on low dose pred 5 mg   . Renal disorder    Past Surgical History:  Procedure Laterality Date  . ABDOMINAL HYSTERECTOMY     with bladder tact and rectocele repair  . CATARACT EXTRACTION  2010   both  . COLONOSCOPY  2016 was most recent   Social History   Socioeconomic History  . Marital status: Widowed    Spouse name: Not on file  . Number of children: Not on file  . Years of education: Not on file  . Highest education level: Not on file  Occupational History  . Occupation: retired  Tobacco Use  . Smoking status: Never Smoker  . Smokeless tobacco: Never Used  Substance and Sexual Activity  . Alcohol use: Yes    Alcohol/week: 0.0 standard drinks    Comment: occ  . Drug use: No  . Sexual activity: Not on file  Other Topics Concern  . Not on file  Social History Narrative   Retired   Regular exercise-  yes   Widowed   At home with children and GKs    HH of 2     Puppy   Plays cards is social and active    Social Determinants of Health   Financial Resource Strain:   . Difficulty of Paying Living Expenses:   Food Insecurity:   . Worried About Charity fundraiser in the Last Year:   . Arboriculturist in the Last Year:   Transportation Needs:   . Film/video editor (Medical):   Marland Kitchen Lack of Transportation (Non-Medical):   Physical Activity:   . Days of Exercise per Week:   . Minutes of Exercise per Session:   Stress:   . Feeling of Stress :   Social Connections:   . Frequency of Communication with Friends and Family:   . Frequency of Social Gatherings with Friends and Family:   . Attends Religious Services:   . Active Member of Clubs or Organizations:   . Attends Archivist Meetings:   Marland Kitchen Marital Status:    Family History  Problem Relation Age of Onset  . Other Mother        fx hip  . Other Sister        fx hip  . Bladder  Cancer Brother   . Colon cancer Neg Hx   . Esophageal cancer Neg Hx   . Stomach cancer Neg Hx   . Rectal cancer Neg Hx   . Liver cancer Neg Hx    Allergies  Allergen Reactions  . Alendronate Sodium     upset stomach  . Penicillins     Has patient had a PCN reaction causing immediate rash, facial/tongue/throat swelling, SOB or lightheadedness with hypotension: Yes Has patient had a PCN reaction causing severe rash involving mucus membranes or skin necrosis: No Has patient had a PCN reaction that required hospitalization: No Has patient had a PCN reaction occurring within the last 10 years: No If all of the above answers are "NO", then may proceed with Cephalosporin use.   Marland Kitchen Amoxicillin Rash    Has patient had a PCN reaction causing immediate rash, facial/tongue/throat swelling, SOB or lightheadedness with hypotension: yes Has patient had a PCN reaction causing severe rash involving mucus membranes or skin necrosis: no Has patient had a  PCN reaction that required hospitalization: no Has patient had PCN reaction within the last 10 years: no  If all of the above answers are "NO", then may proceed with Cephalosporin use.  REACTION: unspecified  . Metronidazole Other (See Comments)    Pounding headaches patient says was bad.  With cipro for diverticulitis rx. Other reaction(s): Other (See Comments) Pounding headaches patient says was bad.With cipro for diverticulitis rx.   Prior to Admission medications   Medication Sig Start Date End Date Taking? Authorizing Provider  acetaminophen (TYLENOL) 500 MG tablet Take 500 mg by mouth every 6 (six) hours as needed for mild pain, moderate pain or headache.   Yes [provider]  ALPRAZolam Duanne Moron) 0.5 MG tablet Take 0.5 mg by mouth at bedtime as needed for anxiety.   Yes [provider]  AMBULATORY NON FORMULARY MEDICATION Medication Name: Diltiazem 2% with lidocaine 5% Insert a pea size amount into rectum to the first knuckle TID 06/12/17  Yes Gatha Mayer, MD  Ascorbic Acid (VITAMIN C) 100 MG tablet Take 100 mg by mouth daily.     Yes [provider]  calcium carbonate (OS-CAL) 1250 (500 Ca) MG chewable tablet Chew 1 tablet by mouth 2 (two) times daily.   Yes [provider]  calcium-vitamin D (OSCAL WITH D) 500-200 MG-UNIT TABS tablet Take by mouth.   Yes [provider]  conjugated estrogens (PREMARIN) vaginal cream Place 1 Applicatorful vaginally daily as needed (dryness).   Yes [provider]  diltiazem (CARDIZEM CD) 120 MG 24 hr capsule TAKE 1 CAPSULE BY MOUTH EVERY DAY 12/21/18  Yes Simmons, Brittainy M, PA-C  ELIQUIS 5 MG TABS tablet TAKE 1 TABLET BY MOUTH TWICE A DAY 05/28/19  Yes Burnell Blanks, MD  fluticasone (FLONASE) 50 MCG/ACT nasal spray Place 2 sprays into both nostrils daily.   Yes [provider]  guaiFENesin (MUCINEX) 600 MG 12 hr tablet Take 600 mg by mouth 2 (two) times daily as needed  (allergies).   Yes [provider]  loratadine (CLARITIN) 10 MG tablet Take 10 mg by mouth daily as needed for allergies.   Yes [provider]  LORazepam (ATIVAN) 0.5 MG tablet TAKE 1-2 TABLETS BY MOUTH 2 TIMES DAILY AS NEEDED FOR ANXIETY. AVOID REGULAR USE 05/17/19  Yes Panosh, Standley Brooking, MD  magnesium hydroxide (MILK OF MAGNESIA) 400 MG/5ML suspension Take 15 mLs by mouth daily as needed for mild constipation.   Yes [provider]  MULTIPLE VITAMIN PO Take 1 tablet by mouth daily.    Yes [provider]  Omega-3 Fatty Acids (FISH OIL) 1000 MG CAPS Take by mouth.   Yes [provider]  polyethylene glycol (MIRALAX / GLYCOLAX) packet Take 17 g by mouth daily.   Yes [provider]  Probiotic Product (ALIGN) 4 MG CAPS Take 1 capsule by mouth daily.   Yes [provider]  RESTASIS 0.05 % ophthalmic emulsion Place 1 drop into both eyes 2 (two) times daily. 10/26/14  Yes [provider]     Positive ROS: Otherwise negative  All other systems have been reviewed and were otherwise negative with the exception of those mentioned in the HPI and as above.  Physical Exam: Constitutional: Alert, well-appearing, no acute distress Ears: External ears without lesions or tenderness. Ear canals are clear bilaterally with intact, clear TMs bilaterally. Nasal: External nose without lesions. Septum midline..  Mild rhinitis with clear mucus discharge.  Nasal endoscopy was performed on the right side where she has been having pain in the right maxillary sinus.  On nasal endoscopy the right maxillary ostia is patent but does have a little bit of thick colored drainage from the ostia.  Nasopharynx is clear. Oral: Lips and gums without lesions. Tongue and palate mucosa without lesions. Posterior oropharynx clear. Neck: No palpable adenopathy or masses Respiratory: Breathing comfortably  Skin: No facial/neck lesions or rash noted.  Audiogram today  demonstrated mild to moderate bilateral sensorineural hearing loss slightly worse on the right side.  SRT's were 40 dB on the right and 30 dB on the left.  She had type A tympanogram on the left and type C tympanogram on the right.  Nasal/sinus endoscopy  Date/Time: 06/11/2019 5:11 PM Performed by: Rozetta Nunnery, MD Authorized by: Rozetta Nunnery, MD   Consent:    Consent obtained:  Verbal   Consent given by:  Patient   Risks discussed:  Pain Procedure details:    Indications: sino-nasal symptoms     Medication:  Afrin   Scope location: right nare   Sinus:    Right middle meatus: normal     Right nasopharynx: normal     Right Eustachian tube orifices: normal   Comments:     On right nasal endoscopy patient has a patent right maxillary ostia but does have a little bit of drainage from the right maxillary ostia.  Nasopharynx and eustachian tube region otherwise clear.    Assessment: Chronic rhinitis Mild to moderate bilateral SNHL.  Patient would be a candidate for hearing aids. Chronic rhinitis with history of right maxillary sinus infections.  Plan: Recommended continue use of nasal steroid spray on a regular basis. I also prescribed a Z-Pak to take if she develops any pain or discomfort in the right cheek area. Also reviewed the audiogram with her today in the office and would recommend use of hearing aids.   Radene Journey, MD

## 2019-06-14 ENCOUNTER — Encounter (INDEPENDENT_AMBULATORY_CARE_PROVIDER_SITE_OTHER): Payer: Self-pay

## 2019-06-14 IMAGING — CT CT MAXILLOFACIAL W/O CM
2 series · 15 of 30 positions shown, 19 images · non-contrast
Comparison: None.

CLINICAL DATA: Chronic sinusitis. Right-sided pain and swelling.
Possible hearing loss.

EXAM:
CT MAXILLOFACIAL WITHOUT CONTRAST
TECHNIQUE: Multidetector CT images of the paranasal sinuses were obtained using
the standard protocol without intravenous contrast.

[Series 4: maxofacial soft (person_name) · axial · 0.34mm/px · z∈[-156,-57]mm · 13 of 39 slices shown, 17 images]
[im 3/39  brain]
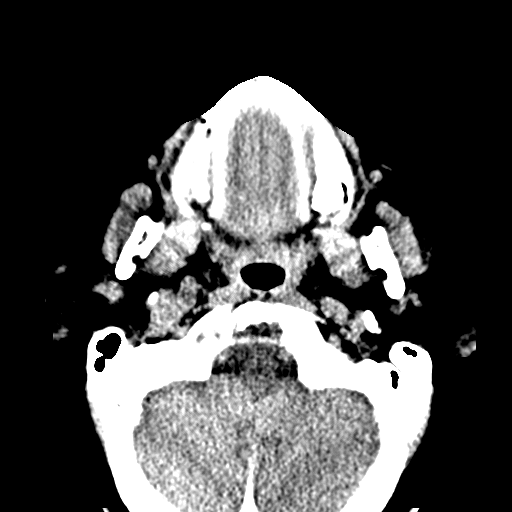
[im 3/39  bone]
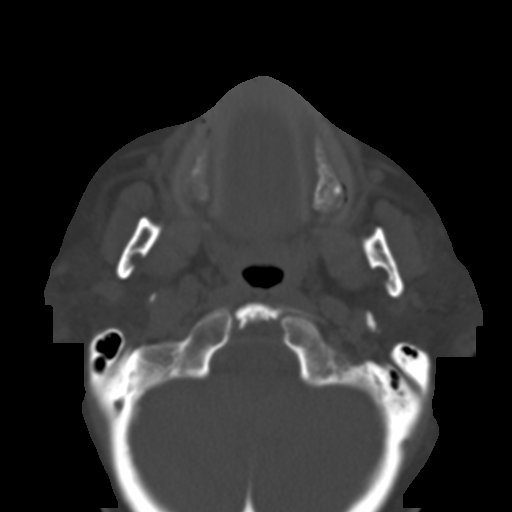
[im 6/39  bone]
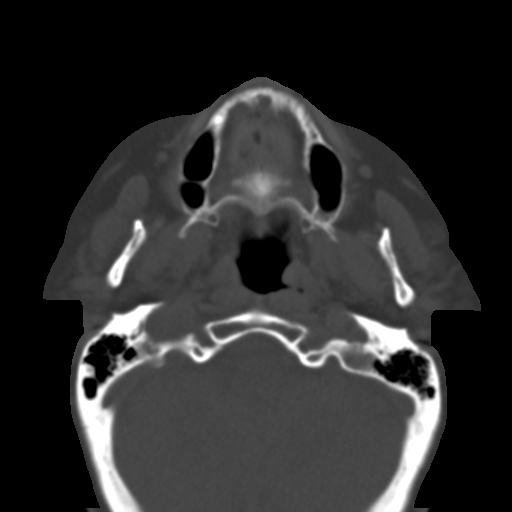
[im 9/39  bone]
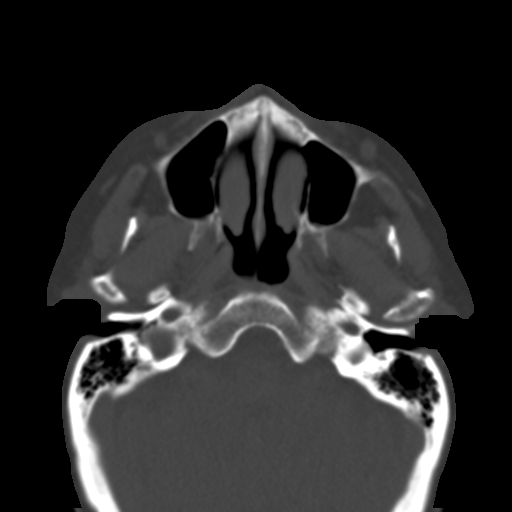
[im 11/39  bone]
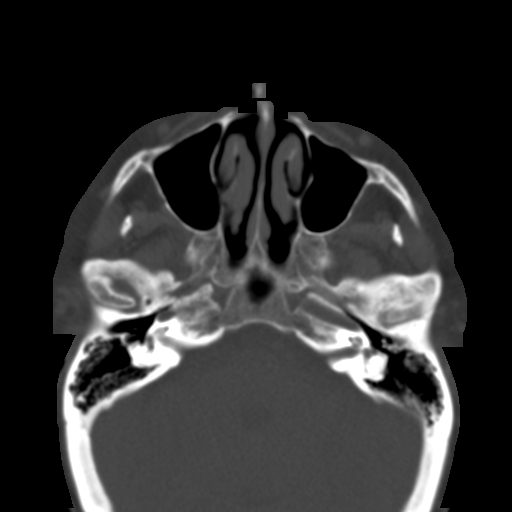
[im 14/39  brain]
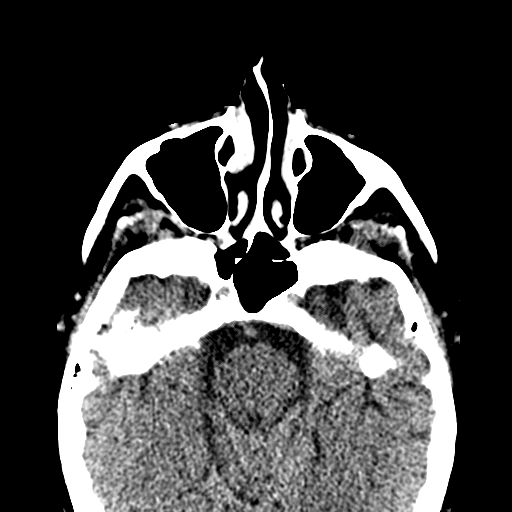
[im 14/39  bone]
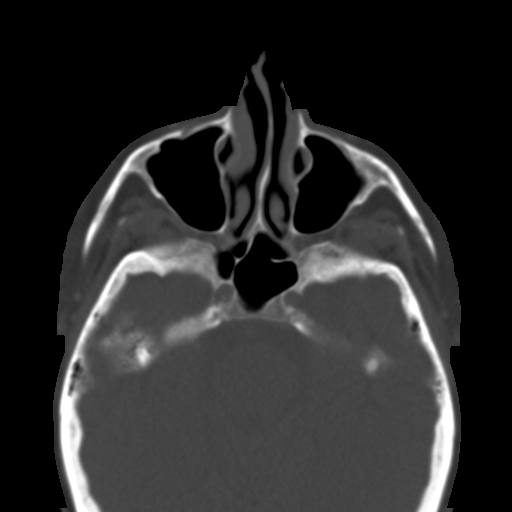
[im 17/39  bone]
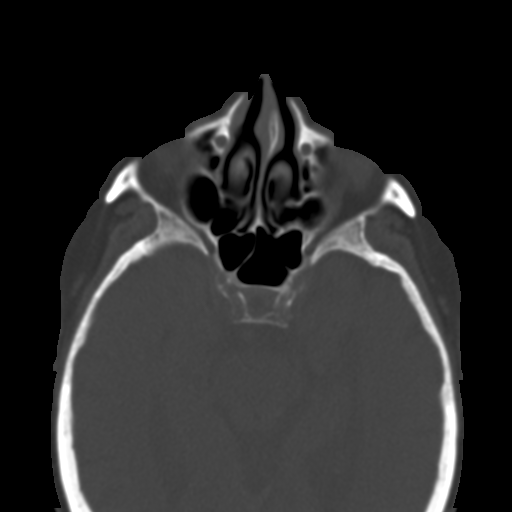
[im 20/39  bone]
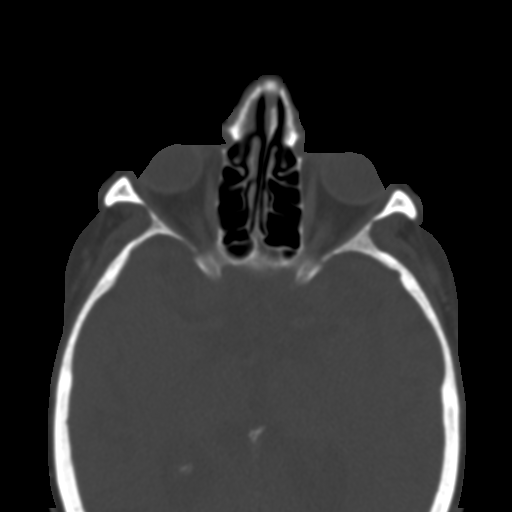
[im 22/39  bone]
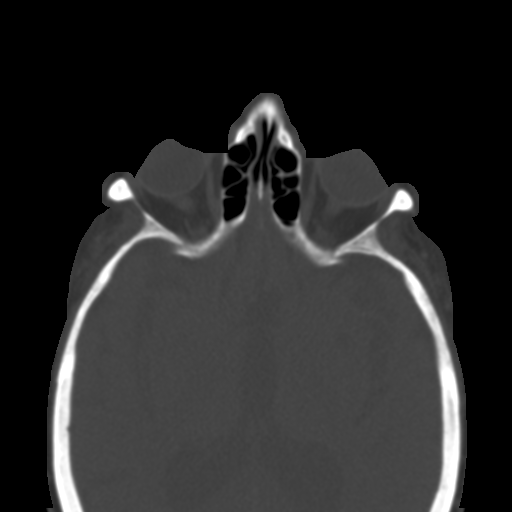
[im 25/39  brain]
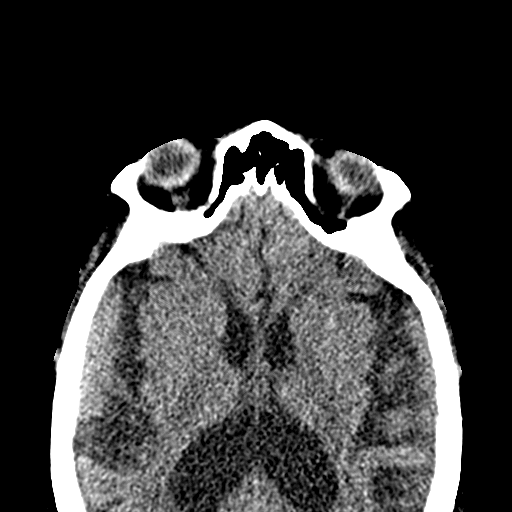
[im 25/39  bone]
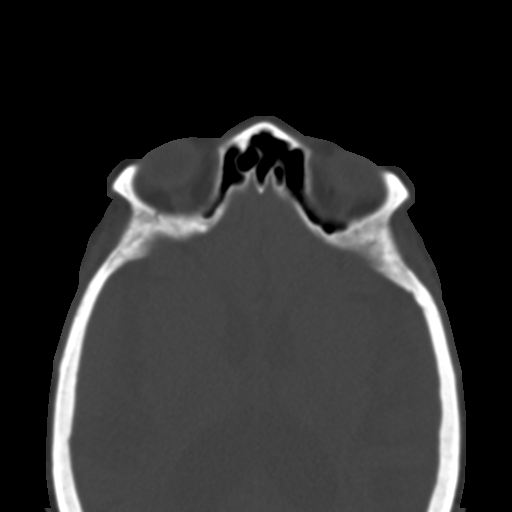
[im 28/39  bone]
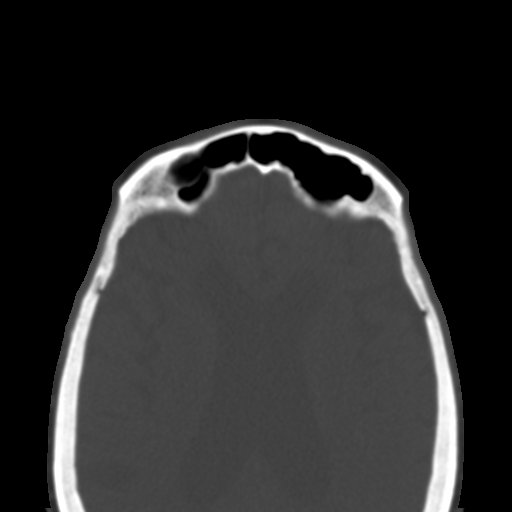
[im 30/39  bone]
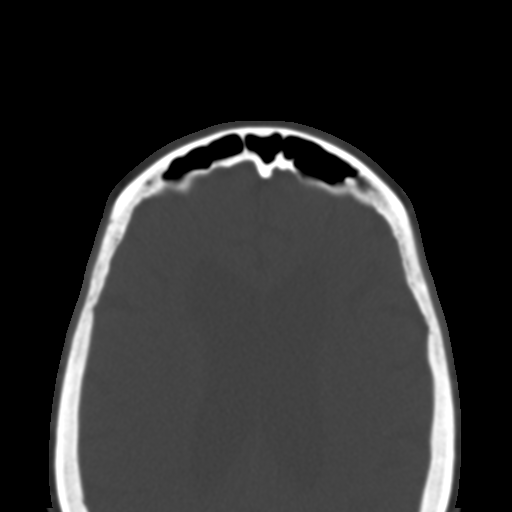
[im 33/39  bone]
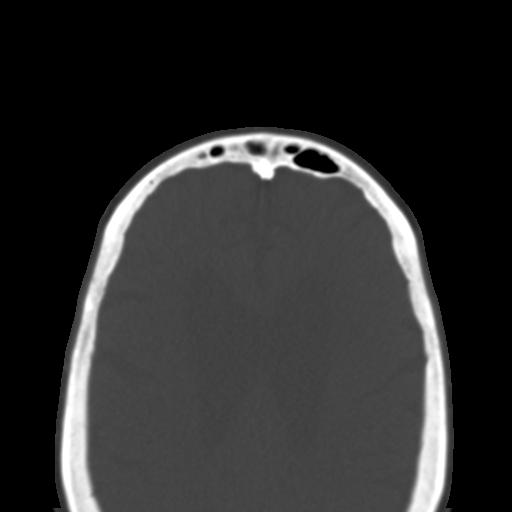
[im 36/39  brain]
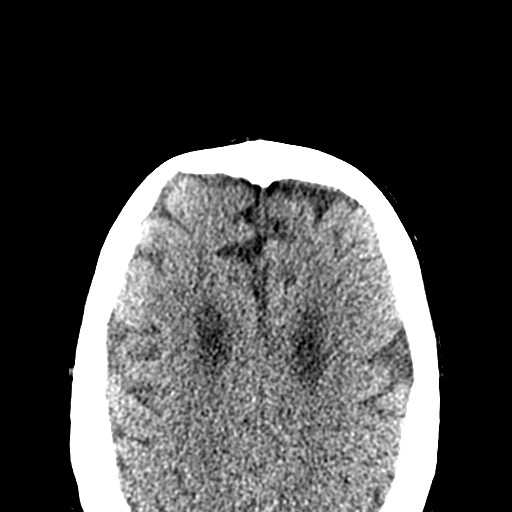
[im 36/39  bone]
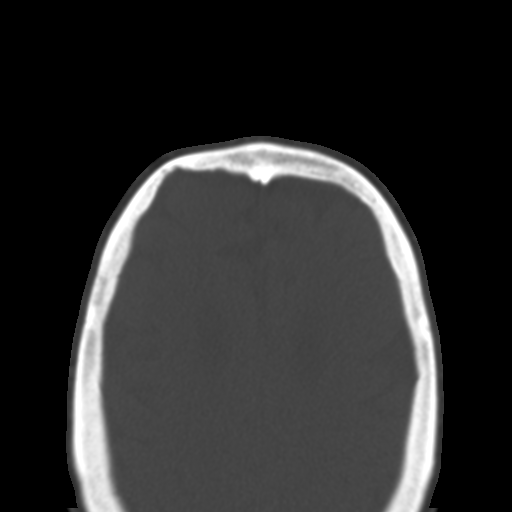

[Series 10: 2.0 soft (person_name) · axial · 0.34mm/px · z∈[-156,-138]mm · 2 of 39 slices shown]
[im 3/39  brain]
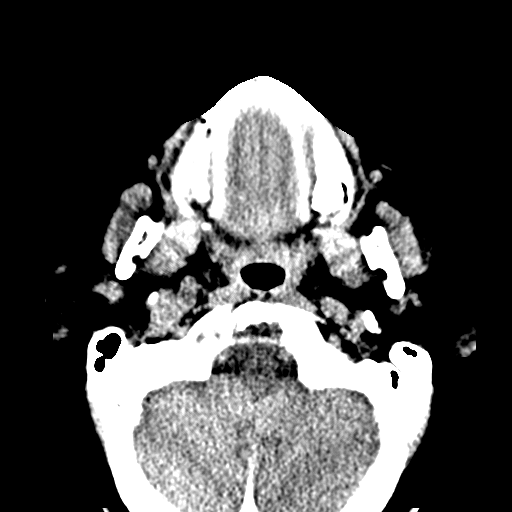
[im 9/39  brain]
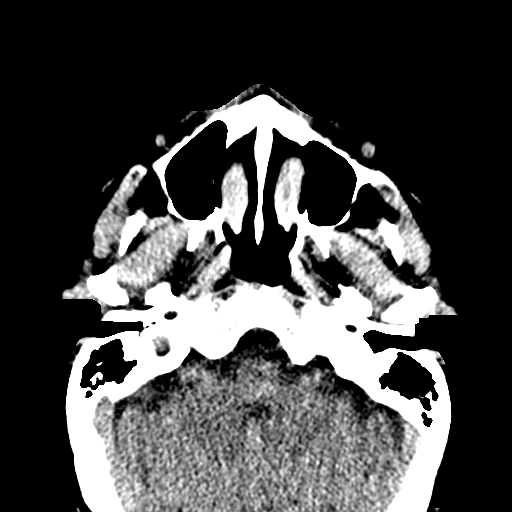

[15 of 30 positions shown; findings below may reference images not displayed]

FINDINGS: Paranasal sinuses:

Frontal: Normally aerated. Patent frontal sinus drainage pathways.

Ethmoid: Normally aerated.

Maxillary: Normally aerated.

Sphenoid: Normally aerated. Patent sphenoethmoidal recesses.

Right ostiomeatal unit: Infundibulum widely patent. No unfavorable
variant.

Left ostiomeatal unit: Infundibulum widely patent. No unfavorable
variant. Large nasoantral window.

Nasal passages: Patent. Intact nasal septum is midline.

Anatomy: Extensive pneumatization superior to the anterior ethmoid
notches. Symmetric and intact olfactory grooves and fovea
ethmoidalis, Keros III (8-16mm) Sellar sphenoid pneumatization
pattern. No dehiscence of carotid or optic canals. No onodi cell.

Other: Other regional soft tissues appear normal. No fluid in the
middle ears or mastoids. CP angle regions appear normal as seen.
IMPRESSION: Negative examination. No evidence of sinus inflammatory disease or
other cause the presenting symptoms.

## 2019-06-15 ENCOUNTER — Other Ambulatory Visit: Payer: Self-pay | Admitting: Internal Medicine

## 2019-06-15 NOTE — Telephone Encounter (Signed)
Okay for refill?   LOV 04/09/2019  Last refill 05/17/2019 20 tabs with no refills

## 2019-06-17 ENCOUNTER — Emergency Department (HOSPITAL_COMMUNITY): Payer: Medicare Other

## 2019-06-17 ENCOUNTER — Inpatient Hospital Stay (HOSPITAL_COMMUNITY)
Admission: EM | Admit: 2019-06-17 | Discharge: 2019-06-19 | DRG: 387 | Disposition: A | Discharge status: Home Health | Payer: Medicare Other | Attending: Internal Medicine | Admitting: Internal Medicine

## 2019-06-17 ENCOUNTER — Other Ambulatory Visit: Payer: Self-pay

## 2019-06-17 ENCOUNTER — Encounter (HOSPITAL_COMMUNITY): Payer: Self-pay

## 2019-06-17 DIAGNOSIS — R197 Diarrhea, unspecified: Secondary | ICD-10-CM | POA: Diagnosis not present

## 2019-06-17 DIAGNOSIS — Z880 Allergy status to penicillin: Secondary | ICD-10-CM | POA: Diagnosis not present

## 2019-06-17 DIAGNOSIS — Z9842 Cataract extraction status, left eye: Secondary | ICD-10-CM | POA: Diagnosis not present

## 2019-06-17 DIAGNOSIS — Z7901 Long term (current) use of anticoagulants: Secondary | ICD-10-CM

## 2019-06-17 DIAGNOSIS — K760 Fatty (change of) liver, not elsewhere classified: Secondary | ICD-10-CM | POA: Diagnosis present

## 2019-06-17 DIAGNOSIS — Z9841 Cataract extraction status, right eye: Secondary | ICD-10-CM | POA: Diagnosis not present

## 2019-06-17 DIAGNOSIS — M5136 Other intervertebral disc degeneration, lumbar region: Secondary | ICD-10-CM | POA: Diagnosis present

## 2019-06-17 DIAGNOSIS — I081 Rheumatic disorders of both mitral and tricuspid valves: Secondary | ICD-10-CM | POA: Diagnosis present

## 2019-06-17 DIAGNOSIS — Z90710 Acquired absence of both cervix and uterus: Secondary | ICD-10-CM

## 2019-06-17 DIAGNOSIS — K5909 Other constipation: Secondary | ICD-10-CM | POA: Diagnosis not present

## 2019-06-17 DIAGNOSIS — N183 Chronic kidney disease, stage 3 unspecified: Secondary | ICD-10-CM | POA: Diagnosis present

## 2019-06-17 DIAGNOSIS — K648 Other hemorrhoids: Secondary | ICD-10-CM | POA: Diagnosis present

## 2019-06-17 DIAGNOSIS — R55 Syncope and collapse: Secondary | ICD-10-CM | POA: Diagnosis not present

## 2019-06-17 DIAGNOSIS — F411 Generalized anxiety disorder: Secondary | ICD-10-CM | POA: Diagnosis present

## 2019-06-17 DIAGNOSIS — Z8719 Personal history of other diseases of the digestive system: Secondary | ICD-10-CM | POA: Diagnosis not present

## 2019-06-17 DIAGNOSIS — M353 Polymyalgia rheumatica: Secondary | ICD-10-CM | POA: Diagnosis present

## 2019-06-17 DIAGNOSIS — M810 Age-related osteoporosis without current pathological fracture: Secondary | ICD-10-CM | POA: Diagnosis present

## 2019-06-17 DIAGNOSIS — N1831 Chronic kidney disease, stage 3a: Secondary | ICD-10-CM | POA: Diagnosis present

## 2019-06-17 DIAGNOSIS — M51369 Other intervertebral disc degeneration, lumbar region without mention of lumbar back pain or lower extremity pain: Secondary | ICD-10-CM | POA: Diagnosis present

## 2019-06-17 DIAGNOSIS — Z20822 Contact with and (suspected) exposure to covid-19: Secondary | ICD-10-CM | POA: Diagnosis present

## 2019-06-17 DIAGNOSIS — Z79890 Hormone replacement therapy (postmenopausal): Secondary | ICD-10-CM | POA: Diagnosis not present

## 2019-06-17 DIAGNOSIS — I480 Paroxysmal atrial fibrillation: Secondary | ICD-10-CM | POA: Diagnosis present

## 2019-06-17 DIAGNOSIS — Z8052 Family history of malignant neoplasm of bladder: Secondary | ICD-10-CM | POA: Diagnosis not present

## 2019-06-17 DIAGNOSIS — J302 Other seasonal allergic rhinitis: Secondary | ICD-10-CM | POA: Diagnosis present

## 2019-06-17 DIAGNOSIS — K5130 Ulcerative (chronic) rectosigmoiditis without complications: Principal | ICD-10-CM | POA: Diagnosis present

## 2019-06-17 DIAGNOSIS — F419 Anxiety disorder, unspecified: Secondary | ICD-10-CM | POA: Diagnosis present

## 2019-06-17 DIAGNOSIS — K529 Noninfective gastroenteritis and colitis, unspecified: Secondary | ICD-10-CM | POA: Diagnosis present

## 2019-06-17 DIAGNOSIS — E785 Hyperlipidemia, unspecified: Secondary | ICD-10-CM | POA: Diagnosis present

## 2019-06-17 DIAGNOSIS — Z888 Allergy status to other drugs, medicaments and biological substances status: Secondary | ICD-10-CM

## 2019-06-17 DIAGNOSIS — Z79899 Other long term (current) drug therapy: Secondary | ICD-10-CM

## 2019-06-17 DIAGNOSIS — G4700 Insomnia, unspecified: Secondary | ICD-10-CM | POA: Diagnosis present

## 2019-06-17 DIAGNOSIS — K5902 Outlet dysfunction constipation: Secondary | ICD-10-CM | POA: Diagnosis present

## 2019-06-17 LAB — CBC WITH DIFFERENTIAL/PLATELET
Abs Immature Granulocytes: 0.06 10*3/uL (ref 0.00–0.07)
Basophils Absolute: 0.1 10*3/uL (ref 0.0–0.1)
Basophils Relative: 1 %
Eosinophils Absolute: 0.1 10*3/uL (ref 0.0–0.5)
Eosinophils Relative: 0 %
HCT: 42 % (ref 36.0–46.0)
Hemoglobin: 13.7 g/dL (ref 12.0–15.0)
Immature Granulocytes: 1 %
Lymphocytes Relative: 12 %
Lymphs Abs: 1.4 10*3/uL (ref 0.7–4.0)
MCH: 32.1 pg (ref 26.0–34.0)
MCHC: 32.6 g/dL (ref 30.0–36.0)
MCV: 98.4 fL (ref 80.0–100.0)
Monocytes Absolute: 0.9 10*3/uL (ref 0.1–1.0)
Monocytes Relative: 8 %
Neutro Abs: 9.3 10*3/uL — ABNORMAL HIGH (ref 1.7–7.7)
Neutrophils Relative %: 78 %
Platelets: 249 10*3/uL (ref 150–400)
RBC: 4.27 MIL/uL (ref 3.87–5.11)
RDW: 12.8 % (ref 11.5–15.5)
WBC: 11.9 10*3/uL — ABNORMAL HIGH (ref 4.0–10.5)
nRBC: 0 % (ref 0.0–0.2)

## 2019-06-17 LAB — COMPREHENSIVE METABOLIC PANEL
ALT: 22 U/L (ref 0–44)
AST: 25 U/L (ref 15–41)
Albumin: 3.8 g/dL (ref 3.5–5.0)
Alkaline Phosphatase: 60 U/L (ref 38–126)
Anion gap: 14 (ref 5–15)
BUN: 12 mg/dL (ref 8–23)
CO2: 22 mmol/L (ref 22–32)
Calcium: 9.2 mg/dL (ref 8.9–10.3)
Chloride: 101 mmol/L (ref 98–111)
Creatinine, Ser: 1.01 mg/dL — ABNORMAL HIGH (ref 0.44–1.00)
GFR calc Af Amer: 59 mL/min — ABNORMAL LOW (ref >60)
GFR calc non Af Amer: 51 mL/min — ABNORMAL LOW (ref >60)
Glucose, Bld: 125 mg/dL — ABNORMAL HIGH (ref 70–99)
Potassium: 3.7 mmol/L (ref 3.5–5.1)
Sodium: 137 mmol/L (ref 135–145)
Total Bilirubin: 0.8 mg/dL (ref 0.3–1.2)
Total Protein: 6.6 g/dL (ref 6.5–8.1)

## 2019-06-17 LAB — URINALYSIS, ROUTINE W REFLEX MICROSCOPIC
Bilirubin Urine: NEGATIVE
Glucose, UA: NEGATIVE mg/dL
Hgb urine dipstick: NEGATIVE
Ketones, ur: NEGATIVE mg/dL
Leukocytes,Ua: NEGATIVE
Nitrite: NEGATIVE
Protein, ur: NEGATIVE mg/dL
Specific Gravity, Urine: 1.028 (ref 1.005–1.030)
pH: 8 (ref 5.0–8.0)

## 2019-06-17 LAB — POC OCCULT BLOOD, ED: Fecal Occult Bld: POSITIVE — AB

## 2019-06-17 LAB — LIPASE, BLOOD: Lipase: 45 U/L (ref 11–51)

## 2019-06-17 LAB — C DIFFICILE QUICK SCREEN W PCR REFLEX
C Diff antigen: NEGATIVE
C Diff interpretation: NOT DETECTED
C Diff toxin: NEGATIVE

## 2019-06-17 LAB — MAGNESIUM: Magnesium: 1.6 mg/dL — ABNORMAL LOW (ref 1.7–2.4)

## 2019-06-17 MED ORDER — ACETAMINOPHEN 325 MG PO TABS: 650.0000 mg | ORAL_TABLET | Freq: Four times a day (QID) | ORAL | Status: DC | PRN

## 2019-06-17 MED ORDER — LORATADINE 10 MG PO TABS
10.0000 mg | ORAL_TABLET | Freq: Every day | ORAL | Status: DC | PRN
  Administered 2019-06-18: 10 mg via ORAL
  Filled 2019-06-17: qty 1

## 2019-06-17 MED ORDER — SODIUM CHLORIDE 0.9 % IV SOLN: INTRAVENOUS | Status: DC

## 2019-06-17 MED ORDER — CYCLOSPORINE 0.05 % OP EMUL
1.0000 [drp] | Freq: Two times a day (BID) | OPHTHALMIC | Status: DC
  Administered 2019-06-17 – 2019-06-19 (×4): 1 [drp] via OPHTHALMIC
  Filled 2019-06-17 (×6): qty 1

## 2019-06-17 MED ORDER — IOHEXOL 300 MG/ML  SOLN
100.0000 mL | Freq: Once | INTRAMUSCULAR | Status: AC | PRN
  Administered 2019-06-17: 75 mL via INTRAVENOUS

## 2019-06-17 MED ORDER — CIPROFLOXACIN IN D5W 400 MG/200ML IV SOLN
400.0000 mg | Freq: Two times a day (BID) | INTRAVENOUS | Status: DC
  Administered 2019-06-18 – 2019-06-19 (×3): 400 mg via INTRAVENOUS
  Filled 2019-06-17 (×3): qty 200

## 2019-06-17 MED ORDER — METRONIDAZOLE IN NACL 5-0.79 MG/ML-% IV SOLN
500.0000 mg | Freq: Once | INTRAVENOUS | Status: AC
  Administered 2019-06-17: 500 mg via INTRAVENOUS
  Filled 2019-06-17: qty 100

## 2019-06-17 MED ORDER — APIXABAN 5 MG PO TABS
5.0000 mg | ORAL_TABLET | Freq: Two times a day (BID) | ORAL | Status: DC
  Administered 2019-06-18 – 2019-06-19 (×4): 5 mg via ORAL
  Filled 2019-06-17 (×5): qty 1

## 2019-06-17 MED ORDER — ACETAMINOPHEN 650 MG RE SUPP: 650.0000 mg | Freq: Four times a day (QID) | RECTAL | Status: DC | PRN

## 2019-06-17 MED ORDER — SODIUM CHLORIDE 0.9 % IV SOLN: Freq: Once | INTRAVENOUS | Status: AC

## 2019-06-17 MED ORDER — HYDROMORPHONE HCL 1 MG/ML IJ SOLN: 0.5000 mg | INTRAMUSCULAR | Status: DC | PRN

## 2019-06-17 MED ORDER — METRONIDAZOLE IN NACL 5-0.79 MG/ML-% IV SOLN
500.0000 mg | Freq: Three times a day (TID) | INTRAVENOUS | Status: DC
  Administered 2019-06-18 – 2019-06-19 (×4): 500 mg via INTRAVENOUS
  Filled 2019-06-17 (×4): qty 100

## 2019-06-17 MED ORDER — GUAIFENESIN ER 600 MG PO TB12: 600.0000 mg | ORAL_TABLET | Freq: Two times a day (BID) | ORAL | Status: DC | PRN

## 2019-06-17 MED ORDER — ONDANSETRON HCL 4 MG/2ML IJ SOLN: 4.0000 mg | Freq: Four times a day (QID) | INTRAMUSCULAR | Status: DC | PRN

## 2019-06-17 MED ORDER — SODIUM CHLORIDE 0.9 % IV BOLUS
500.0000 mL | Freq: Once | INTRAVENOUS | Status: AC
  Administered 2019-06-17: 500 mL via INTRAVENOUS

## 2019-06-17 MED ORDER — ALPRAZOLAM 0.5 MG PO TABS
0.5000 mg | ORAL_TABLET | Freq: Every evening | ORAL | Status: DC | PRN
  Administered 2019-06-18 (×2): 0.5 mg via ORAL
  Filled 2019-06-17 (×2): qty 1

## 2019-06-17 MED ORDER — ONDANSETRON HCL 4 MG PO TABS: 4.0000 mg | ORAL_TABLET | Freq: Four times a day (QID) | ORAL | Status: DC | PRN

## 2019-06-17 MED ORDER — FLUTICASONE PROPIONATE 50 MCG/ACT NA SUSP
2.0000 | Freq: Every day | NASAL | Status: DC
  Administered 2019-06-18: 2 via NASAL
  Filled 2019-06-17: qty 16

## 2019-06-17 MED ORDER — CIPROFLOXACIN IN D5W 400 MG/200ML IV SOLN
400.0000 mg | Freq: Once | INTRAVENOUS | Status: AC
  Administered 2019-06-17: 400 mg via INTRAVENOUS
  Filled 2019-06-17: qty 200

## 2019-06-17 NOTE — H&P (Addendum)
History and Physical    Ashley Lawson LHT:[id number] DOB: 1932/12/25 DOA: 06/17/2019  PCP: Burnis Medin, MD  Patient coming from: Home I have personally briefly reviewed patient's old medical records in [place]  Chief Complaint: Syncope  HPI: Ashley Lawson is a 86 y.o. female with medical history significant of chronic constipation, lumbar degenerative disc disease, compression fracture, diverticulosis, GERD, hepatic steatosis, hyperlipidemia, internal hemorrhoids, paroxysmal A. fib on Eliquis, polymyalgia rheumatica presents to emergency department after syncopal episode.  Patient tells me that she has chronic constipation.  She took MiraLAX along with grape juice and ate several prunes this morning.  She then attempted to have a bowel movement and while she was sitting on the commode she developed severe abdominal pain and suddenly she fell on the ground.  She tells me that she has loss of consciousness for couple of seconds.  Her daughter called EMS and brought patient to the emergency department for further evaluation and management.  No history of head trauma, seizure, urinary or bowel incontinence, fever, chills, nausea, vomiting, cough, congestion, chest pain, shortness of breath, palpitation, leg swelling.  She takes Eliquis for A. fib.  She has internal hemorrhoids and was prescribed Anusol gel however she is not using it.  She noticed intermittent blood on toilet paper.  She lives with her daughter at home.  No history of smoking, alcohol, is a drug use.  ED Course: Upon arrival to ED patient's blood pressure was on lower side.  She had 3 watery stool by in the ED.  CBC shows leukocytosis of 11.9, UA negative for infection, occult blood positive.  Lipase: WNL, C. difficile negative, CMP shows stable CKD stage III.  CT head without contrast came back negative.  CT abdomen/pelvis shows proctocolitis.  Patient received IV fluids, IV Rocephin and Cipro in the ED.  [facility]  hospitalist consulted for admission.  Review of Systems: As per HPI otherwise negative.    Past Medical History:  Diagnosis Date  . Abdominal pain 12/2009   hospitalized  . Allergic rhinitis   . Chronic constipation   . Compression fx, lumbar spine ([place])   . DDD (degenerative disc disease), lumbar   . Diverticulosis of colon   . Dog bite(E906.0) 08/30/2011  . GERD (gastroesophageal reflux disease)   . Hepatic steatosis   . Hyperlipidemia   . Hyperplastic colon polyp   . IBS (irritable bowel syndrome)    constipation predominant  . Internal hemorrhoids   . Osteoporosis    dexa 2011 -2.7 hip nl spine  . PAF (paroxysmal atrial fibrillation) ([place])    a. dx 05/2017.  Marland Kitchen PMR (polymyalgia rheumatica) ([facility]) 08/30/2011   under rheum care  on low dose pred 5 mg   . Renal disorder     Past Surgical History:  Procedure Laterality Date  . ABDOMINAL HYSTERECTOMY     with bladder tact and rectocele repair  . CATARACT EXTRACTION  2010   both  . COLONOSCOPY  2016 was most recent     reports that she has never smoked. She has never used smokeless tobacco. She reports current alcohol use. She reports that she does not use drugs.  Allergies  Allergen Reactions  . Alendronate Sodium     upset stomach  . Penicillins     Has patient had a PCN reaction causing immediate rash, facial/tongue/throat swelling, SOB or lightheadedness with hypotension: Yes Has patient had a PCN reaction causing severe rash involving mucus membranes or skin necrosis: No  Has patient had a PCN reaction that required hospitalization: No Has patient had a PCN reaction occurring within the last 10 years: No If all of the above answers are "NO", then may proceed with Cephalosporin use.   Marland Kitchen Amoxicillin Rash    Has patient had a PCN reaction causing immediate rash, facial/tongue/throat swelling, SOB or lightheadedness with hypotension: yes Has patient had a PCN reaction causing severe rash involving mucus membranes or  skin necrosis: no Has patient had a PCN reaction that required hospitalization: no Has patient had PCN reaction within the last 10 years: no  If all of the above answers are "NO", then may proceed with Cephalosporin use.  REACTION: unspecified  . Metronidazole Other (See Comments)    Pounding headaches patient says was bad.  With cipro for diverticulitis rx. Other reaction(s): Other (See Comments) Pounding headaches patient says was bad.With cipro for diverticulitis rx.    Family History  Problem Relation Age of Onset  . Other Mother        fx hip  . Other Sister        fx hip  . Bladder Cancer Brother   . Colon cancer Neg Hx   . Esophageal cancer Neg Hx   . Stomach cancer Neg Hx   . Rectal cancer Neg Hx   . Liver cancer Neg Hx     Prior to Admission medications   Medication Sig Start Date End Date Taking? Authorizing Provider  acetaminophen (TYLENOL) 500 MG tablet Take 500 mg by mouth every 6 (six) hours as needed for mild pain, moderate pain or headache.   Yes [provider]  ALPRAZolam Duanne Moron) 0.5 MG tablet Take 0.5 mg by mouth at bedtime as needed for anxiety.   Yes [provider]  Ascorbic Acid (VITAMIN C) 100 MG tablet Take 100 mg by mouth daily.     Yes [provider]  calcium carbonate (OS-CAL) 1250 (500 Ca) MG chewable tablet Chew 1 tablet by mouth 2 (two) times daily.   Yes [provider]  calcium-vitamin D (OSCAL WITH D) 500-200 MG-UNIT TABS tablet Take by mouth.   Yes [provider]  conjugated estrogens (PREMARIN) vaginal cream Place 1 Applicatorful vaginally daily as needed (dryness).   Yes [provider]  diltiazem (CARDIZEM CD) 120 MG 24 hr capsule TAKE 1 CAPSULE BY MOUTH EVERY DAY Patient taking differently: Take 120 mg by mouth daily.  12/21/18  Yes Simmons, Brittainy M, PA-C  ELIQUIS 5 MG TABS tablet TAKE 1 TABLET BY MOUTH TWICE A DAY Patient taking differently: Take 5 mg by mouth 2 (two) times daily.   05/28/19  Yes Burnell Blanks, MD  fluticasone (FLONASE) 50 MCG/ACT nasal spray Place 2 sprays into both nostrils daily.   Yes [provider]  guaiFENesin (MUCINEX) 600 MG 12 hr tablet Take 600 mg by mouth 2 (two) times daily as needed (allergies).   Yes [provider]  loratadine (CLARITIN) 10 MG tablet Take 10 mg by mouth daily as needed for allergies.   Yes [provider]  magnesium hydroxide (MILK OF MAGNESIA) 400 MG/5ML suspension Take 15 mLs by mouth daily as needed for mild constipation.   Yes [provider]  polyethylene glycol (MIRALAX / GLYCOLAX) packet Take 17 g by mouth daily.   Yes [provider]  RESTASIS 0.05 % ophthalmic emulsion Place 1 drop into both eyes 2 (two) times daily. 10/26/14  Yes [provider]  AMBULATORY NON FORMULARY MEDICATION Medication Name: Diltiazem  2% with lidocaine 5% Insert a pea size amount into rectum to the first knuckle TID Patient not taking: Reported on 06/17/2019 06/12/17   Gatha Mayer, MD  LORazepam (ATIVAN) 0.5 MG tablet TAKE 1-2 TABLETS BY MOUTH 2 TIMES DAILY AS NEEDED FOR ANXIETY. AVOID REGULAR USE Patient not taking: Reported on 06/17/2019 06/16/19   Burnis Medin, MD    Physical Exam: Vitals:   06/17/19 1545 06/17/19 1630 06/17/19 1645 06/17/19 1700  BP: 125/79 (!) 160/56 139/63 (!) 106/59  Pulse: 84 97 68 89  Resp: 16 19 14 18   Temp:      TempSrc:      SpO2: 100% 99% 99% 98%  Weight:      Height:        Constitutional: NAD, calm, comfortable, communicating well, on room air, Eyes: PERRL, lids and conjunctivae normal ENMT: Mucous membranes are moist. Posterior pharynx clear of any exudate or lesions.Normal dentition.  Neck: normal, supple, no masses, no thyromegaly Respiratory: clear to auscultation bilaterally, no wheezing, no crackles. Normal respiratory effort. No accessory muscle use.  Cardiovascular: Regular rate and rhythm, no murmurs / rubs / gallops. No  extremity edema. 2+ pedal pulses. No carotid bruits.  Abdomen: Mild lower abdominal tenderness positive, no guarding, no rigidity, no masses palpated. No hepatosplenomegaly. Bowel sounds positive.  Musculoskeletal: no clubbing / cyanosis. No joint deformity upper and lower extremities. Good ROM, no contractures. Normal muscle tone.  Skin: Multiple bruises noted on upper extremities, left flank area.   Neurologic: CN 2-12 grossly intact. Sensation intact, DTR normal. Strength 5/5 in all 4.  Psychiatric: Normal judgment and insight. Alert and oriented x 3. Normal mood.    Labs on Admission: I have personally reviewed following labs and imaging studies  CBC: Recent Labs  Lab 06/17/19 1422  WBC 11.9*  NEUTROABS 9.3*  HGB 13.7  HCT 42.0  MCV 98.4  PLT 563   Basic Metabolic Panel: Recent Labs  Lab 06/17/19 1422  NA 137  K 3.7  CL 101  CO2 22  GLUCOSE 125*  BUN 12  CREATININE 1.01*  CALCIUM 9.2   GFR: Estimated Creatinine Clearance: 38 mL/min (A) (by C-G formula based on SCr of 1.01 mg/dL (H)). Liver Function Tests: Recent Labs  Lab 06/17/19 1422  AST 25  ALT 22  ALKPHOS 60  BILITOT 0.8  PROT 6.6  ALBUMIN 3.8   Recent Labs  Lab 06/17/19 1422  LIPASE 45   No results for input(s): AMMONIA in the last 168 hours. Coagulation Profile: No results for input(s): INR, PROTIME in the last 168 hours. Cardiac Enzymes: No results for input(s): CKTOTAL, CKMB, CKMBINDEX, TROPONINI in the last 168 hours. BNP (last 3 results) No results for input(s): PROBNP in the last 8760 hours. HbA1C: No results for input(s): HGBA1C in the last 72 hours. CBG: No results for input(s): GLUCAP in the last 168 hours. Lipid Profile: No results for input(s): CHOL, HDL, LDLCALC, TRIG, CHOLHDL, LDLDIRECT in the last 72 hours. Thyroid Function Tests: No results for input(s): TSH, T4TOTAL, FREET4, T3FREE, THYROIDAB in the last 72 hours. Anemia Panel: No results for input(s): VITAMINB12, FOLATE,  FERRITIN, TIBC, IRON, RETICCTPCT in the last 72 hours. Urine analysis:    Component Value Date/Time   COLORURINE STRAW (A) 06/17/2019 1735   APPEARANCEUR CLEAR 06/17/2019 1735   LABSPEC 1.028 06/17/2019 1735   PHURINE 8.0 06/17/2019 1735   GLUCOSEU NEGATIVE 06/17/2019 1735   GLUCOSEU NEGATIVE 01/18/2019 [place] NEGATIVE 06/17/2019 1735  HGBUR 2+ 08/01/2009 1508   BILIRUBINUR NEGATIVE 06/17/2019 1735   BILIRUBINUR - 04/07/2019 1610   KETONESUR NEGATIVE 06/17/2019 1735   PROTEINUR NEGATIVE 06/17/2019 1735   UROBILINOGEN 0.2 04/07/2019 1610   UROBILINOGEN 0.2 01/18/2019 1555   NITRITE NEGATIVE 06/17/2019 1735   LEUKOCYTESUR NEGATIVE 06/17/2019 1735    Radiological Exams on Admission: CT Head Wo Contrast  Result Date: 06/17/2019 CLINICAL DATA:  Altered mental status EXAM: CT HEAD WITHOUT CONTRAST TECHNIQUE: Contiguous axial images were obtained from the base of the skull through the vertex without intravenous contrast. COMPARISON:  09/17/2018 FINDINGS: Brain: No evidence of acute infarction, hemorrhage, hydrocephalus, extra-axial collection or mass lesion/mass effect. Scattered low-density changes within the periventricular and subcortical white matter compatible with chronic microvascular ischemic change. Moderate diffuse cerebral volume loss. Vascular: Atherosclerotic calcifications involving the large vessels of the skull base. No unexpected hyperdense vessel. Skull: Normal. Negative for fracture or focal lesion. Sinuses/Orbits: No acute finding. Other: None. IMPRESSION: 1.  No acute intracranial findings. 2.  Chronic microvascular ischemic change and cerebral volume loss. Electronically Signed   By: Davina Poke D.O.   On: 06/17/2019 16:19   CT ABDOMEN PELVIS W CONTRAST  Result Date: 06/17/2019 CLINICAL DATA:  Abdominal abscess EXAM: CT ABDOMEN AND PELVIS WITH CONTRAST TECHNIQUE: Multidetector CT imaging of the abdomen and pelvis was performed using the standard protocol  following bolus administration of intravenous contrast. CONTRAST:  47m OMNIPAQUE IOHEXOL 300 MG/ML  SOLN COMPARISON:  September 17, 2018 FINDINGS: Lower chest: The lung bases are clear. The heart size is normal. Hepatobiliary: The liver is normal. Normal gallbladder.There is no biliary ductal dilation. Pancreas: Normal contours without ductal dilatation. No peripancreatic fluid collection. Spleen: No splenic laceration or hematoma. Adrenals/Urinary Tract: --Adrenal glands: No adrenal hemorrhage. --Right kidney/ureter: No hydronephrosis or perinephric hematoma. --Left kidney/ureter: No hydronephrosis or perinephric hematoma. --Urinary bladder: Unremarkable. Stomach/Bowel: --Stomach/Duodenum: No hiatal hernia or other gastric abnormality. Normal duodenal course and caliber. --Small bowel: No dilatation or inflammation. --Colon: There is mild diffuse wall thickening at the level of the rectum. There is scattered colonic diverticula without CT evidence for diverticulitis. --Appendix: Normal. Vascular/Lymphatic: Atherosclerotic calcification is present within the non-aneurysmal abdominal aorta, without hemodynamically significant stenosis. --No retroperitoneal lymphadenopathy. --No mesenteric lymphadenopathy. --No pelvic or inguinal lymphadenopathy. Reproductive: Status post hysterectomy. No adnexal mass. Other: No ascites or free air. The abdominal wall is normal. Musculoskeletal. No acute displaced fractures. IMPRESSION: 1. Mild diffuse wall thickening at the level of the rectum, which may be seen in the setting of proctocolitis. 2. Colonic diverticulosis without diverticulitis. 3. Aortic Atherosclerosis (ICD10-I70.0). Electronically Signed   By: CConstance HolsterM.D.   On: 06/17/2019 16:20    EKG: Atrial fibrillation, Ventricular premature complex, Borderline left axis deviation. Assessment/Plan Principal Problem:   Syncope Active Problems:   Chronic constipation   AF (paroxysmal atrial fibrillation) ([facility])    DDD (degenerative disc disease), lumbar   Proctocolitis   Seasonal allergies   CKD (chronic kidney disease), stage III   Internal hemorrhoids    Syncope: -Likely vasovagal.  CT head without contrast negative for acute findings.  Blood glucose: 125.  UA is negative.  Patient is afebrile. -Admit patient on the floor.  On telemetry. -We will check orthostatic vitals.  Continue IV fluids. -Reviewed previous echo from 05/02/2017 which showed  LVEF 55-60%, mild LVH, systolic bowing of the mitral valve without prolapse, mild mitral regurgitation, normal LA size, mild RAE, mild TR, RVSP 32 mmHg, normal IVC.  -We will repeat transthoracic echo -  Hold diltiazem as patient is hypotensive.  -Monitor vitals closely. -Consult PT/OT -Fall precautions/seizure precautions  Proctocolitis: -Patient has lower abdominal pain associated with watery diarrhea.  C. difficile panel: Negative.  GI pathogen panel: Pending.  Patient is afebrile with leukocytosis of 11.9, lipase: WNL.  UA: Negative. -Received IV Cipro and Flagyl in ED will continue same -Dilaudid as needed for pain control.  Zofran as needed for nausea and vomiting.  Continue IV fluids. -Start on clear liquid diet  Paroxysmal A. fib: Rate controlled -Continue Eliquis, hold diltiazem as patient is hypotensive. -On telemetry.  CKD: Stage IIIa: Stable -Continue to monitor.  Internal hemorrhoids: -Patient's occult blood test positive -H&H is currently stable.  We will continue Eliquis for now.  Monitor H&H closely.  Seasonal allergies: Continue home meds of Flonase, loratadine and Mucinex.  Anxiety/insomnia: Continue Xanax as needed at bedtime.  Chronic constipation: Hold MiraLAX for now.  DVT prophylaxis: Eliquis, SCD/TED  code Status: Full code-confirmed with the patient  family Communication: None present at bedside.  Plan of care discussed with patient in length and she verbalized understanding and agreed with it. Disposition Plan: To  be determined Consults called: None Admission status: Inpatient   Mckinley Jewel MD [facility] Pager [phone]  If 7PM-7AM, please contact night-coverage [email or web address] Password [facility]  06/17/2019, 7:15 PM

## 2019-06-17 NOTE — ED Triage Notes (Signed)
Pt BIB [facility] from home w/ c/o abd pain and syncopal episode. Pt had onset of cramping this am, syncopal episode with straining for BM, out approx 1 minute, per EMS. Pt found pale, hypotensive 99/40 per EMS, had another syncopal episode upon their arrival. Pt in Afib, hx of same, HR 70's, hx of contsipation, TIA, on Eliquis, denies hitting head. Pt A&Ox4, NAD noted

## 2019-06-17 NOTE — ED Provider Notes (Signed)
[facility] EMERGENCY DEPARTMENT Provider Note   CSN: [id number] Arrival date & time: 06/17/19  1317     History Chief Complaint  Patient presents with  . Loss of Consciousness  . Abdominal Pain    Ashley Lawson is a 86 y.o. female.  Patient is an 86 year old female with history of atrial fibrillation on Eliquis, irritable bowel, chronic constipation.  She presents today for evaluation of abdominal pain/syncope.  Patient woke up this morning and drank MiraLAX along with grape juice and ate several prunes.  She then attempted to have a bowel movement.  While sitting on the commode, she developed severe abdominal pain followed by what sounds like a syncopal episode.  Patient states that she was found on the floor by her daughter leaning against the door.  Patient does not recall exactly what led to her ending up on the floor.  She denies to me that she struck her head and denies headache.  She does describe some continued abdominal discomfort.  She denies any fevers or chills.  She denies any black or bloody stool.  The history is provided by the patient.  Loss of Consciousness Episode history:  Single Most recent episode:  Today Progression:  Resolved Chronicity:  New Worsened by:  Nothing Ineffective treatments:  None tried      Past Medical History:  Diagnosis Date  . Abdominal pain 12/2009   hospitalized  . Allergic rhinitis   . Chronic constipation   . Compression fx, lumbar spine ([place])   . DDD (degenerative disc disease), lumbar   . Diverticulosis of colon   . Dog bite(E906.0) 08/30/2011  . GERD (gastroesophageal reflux disease)   . Hepatic steatosis   . Hyperlipidemia   . Hyperplastic colon polyp   . IBS (irritable bowel syndrome)    constipation predominant  . Internal hemorrhoids   . Osteoporosis    dexa 2011 -2.7 hip nl spine  . PAF (paroxysmal atrial fibrillation) ([place])    a. dx 05/2017.  Marland Kitchen PMR (polymyalgia rheumatica) ([facility]) 08/30/2011   under rheum care  on low dose pred 5 mg   . Renal disorder     Patient Active Problem List   Diagnosis Date Noted  . Dizziness 09/17/2018  . Abdominal pain 09/17/2018  . AF (paroxysmal atrial fibrillation) ([place]) 09/17/2018  . Chest discomfort 05/13/2018  . Coagulation defect ([place]) 01/30/2018  . Deviated septum 01/30/2018  . Epistaxis, recurrent 01/30/2018  . Atrial fibrillation with RVR ([place]) 05/02/2017  . Rhinitis, allergic 05/31/2015  . Renal cyst incidental  01/19/2015  . Heartburn 01/14/2014  . Gastroesophageal reflux disease without esophagitis 01/14/2014  . Osteoporosis 01/14/2014  . IBS (irritable bowel syndrome)   . Nail, ingrown 06/22/2012  . Arthritis 02/19/2012  . Screening, lipid 02/18/2011  . Rash and nonspecific skin eruption 02/18/2011  . Visit for preventive health examination 02/18/2011  . Skin cancer 10/14/2010  . Abdominal bloating 10/08/2010  . CONSTIPATION, CHRONIC 01/15/2010  . Lower urinary tract symptoms (LUTS) 01/15/2010  . SUPRAPUBIC PAIN 08/01/2009  . ABDOMINAL PAIN, EPIGASTRIC 06/16/2009  . DIVERTICULOSIS, COLON 06/07/2007  . Hyperlipidemia 06/05/2007  . ANXIETY STATE, UNSPECIFIED 06/05/2007  . Allergic rhinitis 06/05/2007  . IBS 06/05/2007  . OSTEOPOROSIS 06/05/2007  . ABDOMINAL PAIN, GENERALIZED 06/05/2007    Past Surgical History:  Procedure Laterality Date  . ABDOMINAL HYSTERECTOMY     with bladder tact and rectocele repair  . CATARACT EXTRACTION  2010   both  . COLONOSCOPY  2016 was most  recent     OB History   No obstetric history on file.     Family History  Problem Relation Age of Onset  . Other Mother        fx hip  . Other Sister        fx hip  . Bladder Cancer Brother   . Colon cancer Neg Hx   . Esophageal cancer Neg Hx   . Stomach cancer Neg Hx   . Rectal cancer Neg Hx   . Liver cancer Neg Hx     Social History   Tobacco Use  . Smoking status: Never Smoker  . Smokeless tobacco: Never Used  Substance Use  Topics  . Alcohol use: Yes    Alcohol/week: 0.0 standard drinks    Comment: occ  . Drug use: No    Home Medications Prior to Admission medications   Medication Sig Start Date End Date Taking? Authorizing Provider  acetaminophen (TYLENOL) 500 MG tablet Take 500 mg by mouth every 6 (six) hours as needed for mild pain, moderate pain or headache.    [provider]  ALPRAZolam Duanne Moron) 0.5 MG tablet Take 0.5 mg by mouth at bedtime as needed for anxiety.    [provider]  AMBULATORY NON FORMULARY MEDICATION Medication Name: Diltiazem 2% with lidocaine 5% Insert a pea size amount into rectum to the first knuckle TID 06/12/17   Gatha Mayer, MD  Ascorbic Acid (VITAMIN C) 100 MG tablet Take 100 mg by mouth daily.      [provider]  calcium carbonate (OS-CAL) 1250 (500 Ca) MG chewable tablet Chew 1 tablet by mouth 2 (two) times daily.    [provider]  calcium-vitamin D (OSCAL WITH D) 500-200 MG-UNIT TABS tablet Take by mouth.    [provider]  conjugated estrogens (PREMARIN) vaginal cream Place 1 Applicatorful vaginally daily as needed (dryness).    [provider]  diltiazem (CARDIZEM CD) 120 MG 24 hr capsule TAKE 1 CAPSULE BY MOUTH EVERY DAY 12/21/18   Simmons, Brittainy M, PA-C  ELIQUIS 5 MG TABS tablet TAKE 1 TABLET BY MOUTH TWICE A DAY 05/28/19   Burnell Blanks, MD  fluticasone (FLONASE) 50 MCG/ACT nasal spray Place 2 sprays into both nostrils daily.    [provider]  guaiFENesin (MUCINEX) 600 MG 12 hr tablet Take 600 mg by mouth 2 (two) times daily as needed (allergies).    [provider]  loratadine (CLARITIN) 10 MG tablet Take 10 mg by mouth daily as needed for allergies.    [provider]  LORazepam (ATIVAN) 0.5 MG tablet TAKE 1-2 TABLETS BY MOUTH 2 TIMES DAILY AS NEEDED FOR ANXIETY. AVOID REGULAR USE 06/16/19   Panosh, Standley Brooking, MD  magnesium hydroxide (MILK OF MAGNESIA) 400 MG/5ML suspension  Take 15 mLs by mouth daily as needed for mild constipation.    [provider]  MULTIPLE VITAMIN PO Take 1 tablet by mouth daily.     [provider]  Omega-3 Fatty Acids (FISH OIL) 1000 MG CAPS Take by mouth.    [provider]  polyethylene glycol (MIRALAX / GLYCOLAX) packet Take 17 g by mouth daily.    [provider]  Probiotic Product (ALIGN) 4 MG CAPS Take 1 capsule by mouth daily.    [provider]  RESTASIS 0.05 % ophthalmic emulsion Place 1 drop into both eyes 2 (two) times daily. 10/26/14   [provider]    Allergies  Alendronate sodium, Penicillins, Amoxicillin, and Metronidazole  Review of Systems   Review of Systems  Cardiovascular: Positive for syncope.  All other systems reviewed and are negative.   Physical Exam Updated Vital Signs BP (!) 120/57 (BP Location: Right Arm)   Pulse 65   Temp (!) 97.5 F (36.4 C) (Oral)   Resp 19   Ht 5' (1.524 m)   Wt 77.1 kg   SpO2 98%   BMI 33.20 kg/m   Physical Exam Vitals and nursing note reviewed.  Constitutional:      General: She is not in acute distress.    Appearance: She is well-developed. She is not diaphoretic.  HENT:     Head: Normocephalic and atraumatic.  Eyes:     Extraocular Movements: Extraocular movements intact.     Pupils: Pupils are equal, round, and reactive to light.  Cardiovascular:     Rate and Rhythm: Normal rate and regular rhythm.     Heart sounds: No murmur. No friction rub. No gallop.   Pulmonary:     Effort: Pulmonary effort is normal. No respiratory distress.     Breath sounds: Normal breath sounds. No wheezing.  Abdominal:     General: Bowel sounds are normal. There is no distension.     Palpations: Abdomen is soft.     Tenderness: There is abdominal tenderness in the right lower quadrant, suprapubic area and left lower quadrant. There is no right CVA tenderness, left CVA tenderness, guarding or rebound.  Musculoskeletal:         General: Normal range of motion.     Cervical back: Normal range of motion and neck supple.  Skin:    General: Skin is warm and dry.  Neurological:     Mental Status: She is alert and oriented to person, place, and time.     ED Results / Procedures / Treatments   Labs (all labs ordered are listed, but only abnormal results are displayed) Labs Reviewed - No data to display  EKG None  Radiology No results found.  Procedures Procedures (including critical care time)  Medications Ordered in ED Medications - No data to display  ED Course  I have reviewed the triage vital signs and the nursing notes.  Pertinent labs & imaging results that were available during my care of the patient were reviewed by me and considered in my medical decision making (see chart for details).    MDM Rules/Calculators/A&P  Patient presenting with complaints of abdominal pain and diarrhea.  This began this morning after taking MiraLAX.  She passed out in the bathroom and was found on the floor by her daughter.  Patient is on Eliquis.  Work-up thus far shows unremarkable laboratory studies.  Patient continues to have diarrhea.  She is being hydrated with normal saline and will undergo CT scan of the head and abdomen/pelvis.    Care to be signed out to Dr. Laverta Baltimore at shift change.  He will obtain the results of the imaging studies and determine the final disposition.  Final Clinical Impression(s) / ED Diagnoses Final diagnoses:  None    Rx / DC Orders ED Discharge Orders    None       Veryl Speak, MD 06/18/19 [phone]

## 2019-06-17 NOTE — ED Notes (Signed)
Please called daughter when the pt is discharged or admitted number is in the chart and in the pt room

## 2019-06-17 NOTE — ED Notes (Signed)
Pt urinated but sample was contaminated with stool.

## 2019-06-17 NOTE — ED Notes (Signed)
Daughter is aware that pt will be admitted

## 2019-06-17 NOTE — ED Notes (Signed)
Report attempted, 3E unable to take at this time

## 2019-06-17 NOTE — ED Provider Notes (Signed)
Blood pressure (!) 120/57, pulse 65, temperature (!) 97.5 F (36.4 C), temperature source Oral, resp. rate 19, height 5' (1.524 m), weight 77.1 kg, SpO2 98 %.  Assuming care from Dr. Stark Jock.  In short, Ashley Lawson is a 86 y.o. female with a chief complaint of Loss of Consciousness and Abdominal Pain .  Refer to the original H&P for additional details.  The current plan of care is to f/u after CT head and abd/pelvis.  05:15 PM  Patient's C. difficile screen is negative.  Her labs are reassuring but is Hemoccult positive on Eliquis.  CT scan of the head with chronic changes but nothing acute.  CT abdomen pelvis showing diffuse wall thickening at the level of the rectum possibly from proctocolitis.  This is likely the source of the patient's Hemoccult positive stool.  Will start Cipro and Flagyl.  Discussed the patient's listed Flagyl intolerance.  Considered Augmentin as an alternative but also has an allergy listed to amoxicillin.  Plan to continue IV fluids and admit given continued profuse diarrhea, lightheadedness, and 2 episodes of syncope today.    EKG Interpretation  Date/Time:  Thursday June 17 2019 17:31:42 EDT Ventricular Rate:  91 PR Interval:    QRS Duration: 86 QT Interval:  378 QTC Calculation: 466 R Axis:   -18 Text Interpretation: Atrial fibrillation Ventricular premature complex Borderline left axis deviation Low voltage, precordial leads No STEMI Confirmed by Nanda Quinton [phone]) on 06/17/2019 5:38:46 PM      Discussed patient's case with TRH to request admission. Patient and family (if present) updated with plan. Care transferred to [facility] service.  I reviewed all nursing notes, vitals, pertinent old records, EKGs, labs, imaging (as available).     Ashley Fast, MD 06/17/19 Bosie Helper

## 2019-06-18 ENCOUNTER — Inpatient Hospital Stay (HOSPITAL_COMMUNITY): Payer: Medicare Other

## 2019-06-18 DIAGNOSIS — N1831 Chronic kidney disease, stage 3a: Secondary | ICD-10-CM

## 2019-06-18 DIAGNOSIS — I480 Paroxysmal atrial fibrillation: Secondary | ICD-10-CM

## 2019-06-18 DIAGNOSIS — K529 Noninfective gastroenteritis and colitis, unspecified: Secondary | ICD-10-CM

## 2019-06-18 DIAGNOSIS — R55 Syncope and collapse: Secondary | ICD-10-CM

## 2019-06-18 DIAGNOSIS — K5909 Other constipation: Secondary | ICD-10-CM

## 2019-06-18 LAB — CBC
HCT: 36.1 % (ref 36.0–46.0)
Hemoglobin: 12.1 g/dL (ref 12.0–15.0)
MCH: 31.9 pg (ref 26.0–34.0)
MCHC: 33.5 g/dL (ref 30.0–36.0)
MCV: 95.3 fL (ref 80.0–100.0)
Platelets: 213 10*3/uL (ref 150–400)
RBC: 3.79 MIL/uL — ABNORMAL LOW (ref 3.87–5.11)
RDW: 12.8 % (ref 11.5–15.5)
WBC: 6.7 10*3/uL (ref 4.0–10.5)
nRBC: 0 % (ref 0.0–0.2)

## 2019-06-18 LAB — COMPREHENSIVE METABOLIC PANEL
ALT: 16 U/L (ref 0–44)
AST: 17 U/L (ref 15–41)
Albumin: 3 g/dL — ABNORMAL LOW (ref 3.5–5.0)
Alkaline Phosphatase: 48 U/L (ref 38–126)
Anion gap: 11 (ref 5–15)
BUN: 6 mg/dL — ABNORMAL LOW (ref 8–23)
CO2: 23 mmol/L (ref 22–32)
Calcium: 8.6 mg/dL — ABNORMAL LOW (ref 8.9–10.3)
Chloride: 105 mmol/L (ref 98–111)
Creatinine, Ser: 0.86 mg/dL (ref 0.44–1.00)
GFR calc Af Amer: >60 mL/min (ref >60)
GFR calc non Af Amer: >60 mL/min (ref >60)
Glucose, Bld: 91 mg/dL (ref 70–99)
Potassium: 3.2 mmol/L — ABNORMAL LOW (ref 3.5–5.1)
Sodium: 139 mmol/L (ref 135–145)
Total Bilirubin: 0.8 mg/dL (ref 0.3–1.2)
Total Protein: 5.4 g/dL — ABNORMAL LOW (ref 6.5–8.1)

## 2019-06-18 LAB — ECHOCARDIOGRAM COMPLETE
Height: 60 in
Weight: 2720 oz

## 2019-06-18 LAB — SARS CORONAVIRUS 2 (TAT 6-24 HRS): SARS Coronavirus 2: NEGATIVE

## 2019-06-18 MED ORDER — DILTIAZEM HCL ER COATED BEADS 120 MG PO CP24
120.0000 mg | ORAL_CAPSULE | Freq: Every day | ORAL | Status: DC
  Administered 2019-06-18 – 2019-06-19 (×2): 120 mg via ORAL
  Filled 2019-06-18 (×2): qty 1

## 2019-06-18 MED ORDER — POTASSIUM CHLORIDE CRYS ER 20 MEQ PO TBCR
40.0000 meq | EXTENDED_RELEASE_TABLET | Freq: Once | ORAL | Status: AC
  Administered 2019-06-18: 40 meq via ORAL
  Filled 2019-06-18: qty 2

## 2019-06-18 MED ORDER — MAGNESIUM SULFATE 2 GM/50ML IV SOLN
2.0000 g | Freq: Once | INTRAVENOUS | Status: AC
  Administered 2019-06-18: 2 g via INTRAVENOUS
  Filled 2019-06-18: qty 50

## 2019-06-18 NOTE — Plan of Care (Signed)
  Problem: Education: Goal: Knowledge of General Education information will improve Description: Including pain rating scale, medication(s)/side effects and non-pharmacologic comfort measures Outcome: Progressing   Problem: Clinical Measurements: Goal: Ability to maintain clinical measurements within normal limits will improve Outcome: Progressing Goal: Respiratory complications will improve Outcome: Progressing   Problem: Activity: Goal: Risk for activity intolerance will decrease Outcome: Progressing   Problem: Nutrition: Goal: Adequate nutrition will be maintained Outcome: Progressing   Problem: Coping: Goal: Level of anxiety will decrease Outcome: Progressing   Problem: Pain Managment: Goal: General experience of comfort will improve Outcome: Progressing   Problem: Skin Integrity: Goal: Risk for impaired skin integrity will decrease Outcome: Progressing

## 2019-06-18 NOTE — Evaluation (Signed)
Physical Therapy Evaluation Patient Details Name: Ashley Lawson MRN: [id number] DOB: 06-16-1932 Today's Date: 06/18/2019   History of Present Illness  Patient is an 86 year old female who had syncopal episode at home. PMH includes: Afib, DDD, CKD, chronic constipation.  Clinical Impression  Patient received in bed, looking for phone, daughter present. Patient agrees to PT assessment. Reports she is feeling better. Performed bed mobility with supervision and use of bed rails. Patient transfers sit to stand with min guard. She ambulated in room 60 feet, no AD, min guard. No overt LOB, but unsteadiness noted. Able to transfer on/off commode with supervision. She will continue to benefit from skilled PT while here to improve strength and activity tolerance.      Follow Up Recommendations Home health PT    Equipment Recommendations  None recommended by PT    Recommendations for Other Services       Precautions / Restrictions Precautions Precautions: Fall Restrictions Weight Bearing Restrictions: No      Mobility  Bed Mobility Overal bed mobility: Needs Assistance Bed Mobility: Supine to Sit;Sit to Supine     Supine to sit: Supervision Sit to supine: Supervision      Transfers Overall transfer level: Needs assistance Equipment used: None Transfers: Sit to/from Stand Sit to Stand: Supervision            Ambulation/Gait Ambulation/Gait assistance: Min guard Gait Distance (Feet): 60 Feet Assistive device: None Gait Pattern/deviations: Step-through pattern;Drifts right/left Gait velocity: decr   General Gait Details: decreased balance noted with ambulation this visit. No overt LOB but benefits from supervision to min guard for safety. Good awareness.  Stairs            Wheelchair Mobility    Modified Rankin (Stroke Patients Only)       Balance Overall balance assessment: Mild deficits observed, not formally tested                                            Pertinent Vitals/Pain Pain Assessment: No/denies pain (IV discomfort)    Home Living Family/patient expects to be discharged to:: Private residence Living Arrangements: Children Available Help at Discharge: Family;Available PRN/intermittently Type of Home: House Home Access: Stairs to enter   Entrance Stairs-Number of Steps: 1 Home Layout: Two level;Able to live on main level with bedroom/bathroom Home Equipment: Gilford Rile - 2 wheels;Cane - single point;Shower seat - built in      Prior Function Level of Independence: Independent         Comments: does not drive much anymore. Is independent with bathing, dressing and mobility at baseline     Hand Dominance   Dominant Hand: Right    Extremity/Trunk Assessment   Upper Extremity Assessment Upper Extremity Assessment: Generalized weakness    Lower Extremity Assessment Lower Extremity Assessment: Generalized weakness    Cervical / Trunk Assessment Cervical / Trunk Assessment: Normal  Communication   Communication: No difficulties  Cognition Arousal/Alertness: Awake/alert Behavior During Therapy: WFL for tasks assessed/performed Overall Cognitive Status: Within Functional Limits for tasks assessed                                        General Comments      Exercises     Assessment/Plan    PT Assessment  Patient needs continued PT services  PT Problem List Decreased strength;Decreased activity tolerance;Decreased balance;Decreased mobility       PT Treatment Interventions Gait training;Stair training;Functional mobility training;Therapeutic activities;Balance training;Therapeutic exercise;Patient/family education    PT Goals (Current goals can be found in the Care Plan section)  Acute Rehab PT Goals Patient Stated Goal: to return home PT Goal Formulation: With patient Time For Goal Achievement: 06/25/19 Potential to Achieve Goals: Good    Frequency Min 3X/week    Barriers to discharge        Co-evaluation               AM-PAC PT "6 Clicks" Mobility  Outcome Measure Help needed turning from your back to your side while in a flat bed without using bedrails?: None Help needed moving from lying on your back to sitting on the side of a flat bed without using bedrails?: A Little Help needed moving to and from a bed to a chair (including a wheelchair)?: A Little Help needed standing up from a chair using your arms (e.g., wheelchair or bedside chair)?: A Little Help needed to walk in hospital room?: A Little Help needed climbing 3-5 steps with a railing? : A Little 6 Click Score: 19    End of Session Equipment Utilized During Treatment: Gait belt Activity Tolerance: Patient tolerated treatment well Patient left: in bed;with family/visitor present;with call bell/phone within reach Nurse Communication: Mobility status PT Visit Diagnosis: Unsteadiness on feet (R26.81);Muscle weakness (generalized) (M62.81);Difficulty in walking, not elsewhere classified (R26.2)    Time: 0093-8182 PT Time Calculation (min) (ACUTE ONLY): 22 min   Charges:   PT Evaluation $PT Eval Moderate Complexity: 1 Mod PT Treatments $Gait Training: 8-22 mins        Netty Sullivant, PT, GCS 06/18/19,11:23 AM

## 2019-06-18 NOTE — Discharge Instructions (Signed)

## 2019-06-18 NOTE — Progress Notes (Addendum)
Occupational Therapy Evaluation Patient Details Name: Ashley Lawson MRN: [id number] DOB: 1932-11-25 Today's Date: 06/18/2019    History of Present Illness Patient is an 86 year old female who had syncopal episode at home. PMH includes: Afib, DDD, CKD, chronic constipation.   Clinical Impression   Patient is kind.  She lives with daughter at home and was independent prior to admission, though daughter does the driving.  Today she felt good with mobility.  Did not use AD and able to complete transfers with supervision and walk in room with min guard.  She did require min cueing for sequencing and occasional repetition of multistep commands.  After mobility patient was slightly fatigued.  Will continue to follow to address activity tolerance and safety with ADLs.    Follow Up Recommendations  Home health OT;Supervision - Intermittent    Equipment Recommendations  None recommended by OT    Recommendations for Other Services       Precautions / Restrictions Precautions Precautions: Fall Precaution Comments: watch orthostatic Restrictions Weight Bearing Restrictions: No      Mobility Bed Mobility Overal bed mobility: Needs Assistance Bed Mobility: Supine to Sit     Supine to sit: Supervision Sit to supine: Supervision      Transfers Overall transfer level: Needs assistance Equipment used: None Transfers: Sit to/from Stand Sit to Stand: Supervision              Balance Overall balance assessment: Mild deficits observed, not formally tested                                         ADL either performed or assessed with clinical judgement   ADL Overall ADL's : Needs assistance/impaired Eating/Feeding: Independent;Sitting   Grooming: Supervision/safety;Standing   Upper Body Bathing: Set up;Sitting   Lower Body Bathing: Sit to/from stand;Minimal assistance   Upper Body Dressing : Set up;Sitting   Lower Body Dressing: Sit to/from stand;Minimal  assistance   Toilet Transfer: Min guard;Ambulation   Toileting- Clothing Manipulation and Hygiene: Sit to/from stand;Supervision/safety       Functional mobility during ADLs: Min guard       Vision         Perception     Praxis      Pertinent Vitals/Pain Pain Assessment: No/denies pain     Hand Dominance Right   Extremity/Trunk Assessment Upper Extremity Assessment Upper Extremity Assessment: Generalized weakness   Lower Extremity Assessment Lower Extremity Assessment: Generalized weakness   Cervical / Trunk Assessment Cervical / Trunk Assessment: Normal   Communication Communication Communication: No difficulties   Cognition Arousal/Alertness: Awake/alert Behavior During Therapy: WFL for tasks assessed/performed Overall Cognitive Status: Within Functional Limits for tasks assessed                                 General Comments: Little impulsive, some reminders needed for sequencing   General Comments  BP 138/75 standing, 104/81 seated after mobility     Exercises     Shoulder Instructions      Home Living Family/patient expects to be discharged to:: Private residence Living Arrangements: Children Available Help at Discharge: Family;Available PRN/intermittently Type of Home: House Home Access: Stairs to enter Entrance Stairs-Number of Steps: 1   Home Layout: Two level;Able to live on main level with bedroom/bathroom Alternate Level Stairs-Number of Steps: flight  Alternate Level Stairs-Rails: Right Bathroom Shower/Tub: Occupational psychologist: Standard     Home Equipment: Environmental consultant - 2 wheels;Cane - single point;Shower seat - built in          Prior Functioning/Environment Level of Independence: Independent        Comments: does not drive much anymore. Is independent with bathing, dressing and mobility at baseline        OT Problem List: Decreased strength;Decreased activity tolerance;Impaired balance (sitting  and/or standing);Decreased safety awareness;Cardiopulmonary status limiting activity      OT Treatment/Interventions: Self-care/ADL training;Therapeutic exercise;Energy conservation;Therapeutic activities;Balance training;Patient/family education    OT Goals(Current goals can be found in the care plan section) Acute Rehab OT Goals Patient Stated Goal: to return home OT Goal Formulation: With patient Time For Goal Achievement: 07/02/19 Potential to Achieve Goals: Good ADL Goals Pt Will Perform Grooming: (P) Independently;standing Pt Will Transfer to Toilet: (P) Independently  OT Frequency: Min 2X/week   Barriers to D/C:            Co-evaluation              AM-PAC OT "6 Clicks" Daily Activity     Outcome Measure Help from another person eating meals?: None Help from another person taking care of personal grooming?: A Little Help from another person toileting, which includes using toliet, bedpan, or urinal?: A Little Help from another person bathing (including washing, rinsing, drying)?: A Little Help from another person to put on and taking off regular upper body clothing?: A Little Help from another person to put on and taking off regular lower body clothing?: A Little 6 Click Score: 19   End of Session Equipment Utilized During Treatment: Gait belt Nurse Communication: Mobility status  Activity Tolerance: Patient tolerated treatment well Patient left: in chair;with call bell/phone within reach;with family/visitor present  OT Visit Diagnosis: Unsteadiness on feet (R26.81);Muscle weakness (generalized) (M62.81)                Time: 2841-3244 OT Time Calculation (min): 27 min Charges:  OT General Charges $OT Visit: 1 Visit OT Evaluation $OT Eval Moderate Complexity: 1 Mod OT Treatments $Self Care/Home Management : 8-22 mins  August Luz, OTR/L  Phylliss Bob 06/18/2019, 2:06 PM

## 2019-06-18 NOTE — Progress Notes (Signed)
  Echocardiogram 2D Echocardiogram has been attempted. Patient eating. Will reattempt at later time.  Ashley Lawson 06/18/2019, 7:57 AM

## 2019-06-18 NOTE — Progress Notes (Signed)
  Echocardiogram 2D Echocardiogram has been performed.  Randa Lynn Niranjan Rufener 06/18/2019, 9:36 AM

## 2019-06-18 NOTE — Progress Notes (Signed)
PROGRESS NOTE  Ashley Lawson KNL:[id number] DOB: [birth year] DOA: 06/17/2019 PCP: Burnis Medin, MD   LOS: 1 day   Brief Narrative / Interim history: Ashley Lawson is a 86 y.o. female with medical history significant of chronic constipation, lumbar degenerative disc disease, compression fracture, diverticulosis, GERD, hepatic steatosis, hyperlipidemia, internal hemorrhoids, paroxysmal A. fib on Eliquis, polymyalgia rheumatica presents to emergency department after syncopal episode.  This happened while attempting to have a bowel movements, experienced severe abdominal cramping mid lower epigastric area and she passed out.  Daughter tells me that she also passed out a second time when EMS arrived and there was concern for seizure-like activity.  Subjective / 24h Interval events: She is feeling a little bit better this morning, wants to eat more, still complains of generalized weakness but somewhat improved.  Assessment & Plan: Principal Problem Syncopal episode -Likely vasovagal, CT head without acute findings, telemetry unremarkable this morning -Repeated 2D echo ordered by the admitting MD without significant findings -Her home diltiazem has been on hold, her blood pressure started to improve today and will reorder diltiazem -She remains weak currently, advance diet, she is unable to be discharged and continues to require close monitoring for further episodes.  If she is tolerating well may be able to be discharged within 24 hours  Active Problems Proctocolitis -Had lower abdominal pain associated with diarrhea, C. difficile was negative, GI pathogen panel was ordered but her diarrhea seems to be resolved -She has a leukocytosis on admission and has improved to normal with antibiotics, and will continue -Advance diet as tolerated  Paroxysmal A. fib -Continue Eliquis, placed back on diltiazem  Chronic kidney disease stage IIIa -Creatinine stable  Internal hemorrhoids -Patient's  occult blood test positive -H&H is currently stable.  We will continue Eliquis for now.  Monitor H&H closely.  Seasonal allergies  -Continue home meds of Flonase, loratadine and Mucinex.  Anxiety/insomnia -Continue Xanax as needed at bedtime.  Chronic constipation  -Hold MiraLAX for now.  Scheduled Meds: . apixaban  5 mg Oral BID  . cycloSPORINE  1 drop Both Eyes BID  . fluticasone  2 spray Each Nare Daily   Continuous Infusions: . sodium chloride 100 mL/hr at 06/17/19 1857  . ciprofloxacin 400 mg (06/18/19 0552)  . metronidazole 500 mg (06/18/19 0949)   PRN Meds:.acetaminophen **OR** acetaminophen, ALPRAZolam, guaiFENesin, HYDROmorphone (DILAUDID) injection, loratadine, ondansetron **OR** ondansetron (ZOFRAN) IV  DVT prophylaxis: Eliquis Code Status: Full code Family Communication: updated daughter over the phone Patient admitted from: home  Anticipated d/c place: home Barriers to d/c: Remains moderately symptomatic with weakness, advancing diet, anticipate home discharge within 24 hours if continues to improve at the same rate  Consultants:  None   Procedures:  2D echo IMPRESSIONS    1. Left ventricular ejection fraction, by estimation, is 65 to 70%. The  left ventricle has normal function. The left ventricle has no regional  wall motion abnormalities. Left ventricular diastolic parameters are  indeterminate.  2. Right ventricular systolic function is normal. The right ventricular  size is normal. There is normal pulmonary artery systolic pressure.  3. Left atrial size was moderately dilated.  4. Right atrial size was severely dilated.  5. The mitral valve is normal in structure. Trivial mitral valve  regurgitation. No evidence of mitral stenosis.  6. Tricuspid valve regurgitation is mild to moderate.  7. The aortic valve is normal in structure. Aortic valve regurgitation is  not visualized. No aortic stenosis is present.  8. The  inferior vena cava is  normal in size with greater than 50%  respiratory variability, suggesting right atrial pressure of 3 mmHg.  Microbiology  None   Antimicrobials: Cipro/flagyl 3/17 >>    Objective: Vitals:   06/17/19 2305 06/18/19 0454 06/18/19 0800 06/18/19 1217  BP: (!) 146/73 (!) 153/80 133/80 138/75  Pulse: 84 79 70 78  Resp: 19 19 17 15   Temp: 98.9 F (37.2 C) (!) 97.4 F (36.3 C) 97.7 F (36.5 C) (!) 97.5 F (36.4 C)  TempSrc: Oral Oral Oral Oral  SpO2: 100% 100% 100% 99%  Weight:      Height:        Intake/Output Summary (Last 24 hours) at 06/18/2019 1433 Last data filed at 06/18/2019 0940 Gross per 24 hour  Intake 1620.63 ml  Output 1000 ml  Net 620.63 ml   Filed Weights   06/17/19 1346  Weight: 77.1 kg    Examination:  Constitutional: NAD Eyes: no scleral icterus ENMT: Mucous membranes are moist.  Neck: normal, supple Respiratory: clear to auscultation bilaterally, no wheezing, no crackles. Normal respiratory effort.  Cardiovascular: Regular rate and rhythm, no murmurs / rubs / gallops. No LE edema. Good peripheral pulses Abdomen: Very mildly tender to palpation in the left lower quadrant, no guarding or rebound Musculoskeletal: no clubbing / cyanosis.  Skin: no rashes Neurologic: CN 2-12 grossly intact. Strength 5/5 in all 4.    Data Reviewed: I have independently reviewed following labs and imaging studies   CBC: Recent Labs  Lab 06/17/19 1422 06/18/19 0326  WBC 11.9* 6.7  NEUTROABS 9.3*  --   HGB 13.7 12.1  HCT 42.0 36.1  MCV 98.4 95.3  PLT 249 268   Basic Metabolic Panel: Recent Labs  Lab 06/17/19 1422 06/17/19 1947 06/18/19 0326  NA 137  --  139  K 3.7  --  3.2*  CL 101  --  105  CO2 22  --  23  GLUCOSE 125*  --  91  BUN 12  --  6*  CREATININE 1.01*  --  0.86  CALCIUM 9.2  --  8.6*  MG  --  1.6*  --    Liver Function Tests: Recent Labs  Lab 06/17/19 1422 06/18/19 0326  AST 25 17  ALT 22 16  ALKPHOS 60 48  BILITOT 0.8 0.8  PROT 6.6  5.4*  ALBUMIN 3.8 3.0*   Coagulation Profile: No results for input(s): INR, PROTIME in the last 168 hours. HbA1C: No results for input(s): HGBA1C in the last 72 hours. CBG: No results for input(s): GLUCAP in the last 168 hours.  Recent Results (from the past 240 hour(s))  C difficile quick scan w PCR reflex     Status: None   Collection Time: 06/17/19  3:02 PM   Specimen: STOOL  Result Value Ref Range Status   C Diff antigen NEGATIVE NEGATIVE Final   C Diff toxin NEGATIVE NEGATIVE Final   C Diff interpretation No C. difficile detected.  Final    Comment: Performed at [facility], [place] [address] [place], [place] [address]  SARS CORONAVIRUS 2 (TAT 6-24 HRS) Nasopharyngeal Nasopharyngeal Swab     Status: None   Collection Time: 06/17/19  6:41 PM   Specimen: Nasopharyngeal Swab  Result Value Ref Range Status   SARS Coronavirus 2 NEGATIVE NEGATIVE Final    Comment: (NOTE) SARS-CoV-2 target nucleic acids are NOT DETECTED. The SARS-CoV-2 RNA is generally detectable in upper and lower respiratory specimens during the acute  phase of infection. Negative results do not preclude SARS-CoV-2 infection, do not rule out co-infections with other pathogens, and should not be used as the sole basis for treatment or other patient management decisions. Negative results must be combined with clinical observations, patient history, and epidemiological information. The expected result is Negative. Fact Sheet for Patients: SugarRoll.be Fact Sheet for Healthcare Providers: https://www.woods-mathews.com/ This test is not yet approved or cleared by the [place] FDA and  has been authorized for detection and/or diagnosis of SARS-CoV-2 by FDA under an Emergency Use Authorization (EUA). This EUA will remain  in effect (meaning this test can be used) for the duration of the COVID-19 declaration under Section 56 4(b)(1) of the Act, 21 U.S.C. section  360bbb-3(b)(1), unless the authorization is terminated or revoked sooner. Performed at [facility], [place] [address] [place], [place] [address]      Radiology Studies: CT Head Wo Contrast  Result Date: 06/17/2019 CLINICAL DATA:  Altered mental status EXAM: CT HEAD WITHOUT CONTRAST TECHNIQUE: Contiguous axial images were obtained from the base of the skull through the vertex without intravenous contrast. COMPARISON:  09/17/2018 FINDINGS: Brain: No evidence of acute infarction, hemorrhage, hydrocephalus, extra-axial collection or mass lesion/mass effect. Scattered low-density changes within the periventricular and subcortical white matter compatible with chronic microvascular ischemic change. Moderate diffuse cerebral volume loss. Vascular: Atherosclerotic calcifications involving the large vessels of the skull base. No unexpected hyperdense vessel. Skull: Normal. Negative for fracture or focal lesion. Sinuses/Orbits: No acute finding. Other: None. IMPRESSION: 1.  No acute intracranial findings. 2.  Chronic microvascular ischemic change and cerebral volume loss. Electronically Signed   By: Davina Poke D.O.   On: 06/17/2019 16:19   CT ABDOMEN PELVIS W CONTRAST  Result Date: 06/17/2019 CLINICAL DATA:  Abdominal abscess EXAM: CT ABDOMEN AND PELVIS WITH CONTRAST TECHNIQUE: Multidetector CT imaging of the abdomen and pelvis was performed using the standard protocol following bolus administration of intravenous contrast. CONTRAST:  43m OMNIPAQUE IOHEXOL 300 MG/ML  SOLN COMPARISON:  September 17, 2018 FINDINGS: Lower chest: The lung bases are clear. The heart size is normal. Hepatobiliary: The liver is normal. Normal gallbladder.There is no biliary ductal dilation. Pancreas: Normal contours without ductal dilatation. No peripancreatic fluid collection. Spleen: No splenic laceration or hematoma. Adrenals/Urinary Tract: --Adrenal glands: No adrenal hemorrhage. --Right kidney/ureter: No hydronephrosis or  perinephric hematoma. --Left kidney/ureter: No hydronephrosis or perinephric hematoma. --Urinary bladder: Unremarkable. Stomach/Bowel: --Stomach/Duodenum: No hiatal hernia or other gastric abnormality. Normal duodenal course and caliber. --Small bowel: No dilatation or inflammation. --Colon: There is mild diffuse wall thickening at the level of the rectum. There is scattered colonic diverticula without CT evidence for diverticulitis. --Appendix: Normal. Vascular/Lymphatic: Atherosclerotic calcification is present within the non-aneurysmal abdominal aorta, without hemodynamically significant stenosis. --No retroperitoneal lymphadenopathy. --No mesenteric lymphadenopathy. --No pelvic or inguinal lymphadenopathy. Reproductive: Status post hysterectomy. No adnexal mass. Other: No ascites or free air. The abdominal wall is normal. Musculoskeletal. No acute displaced fractures. IMPRESSION: 1. Mild diffuse wall thickening at the level of the rectum, which may be seen in the setting of proctocolitis. 2. Colonic diverticulosis without diverticulitis. 3. Aortic Atherosclerosis (ICD10-I70.0). Electronically Signed   By: CConstance HolsterM.D.   On: 06/17/2019 16:20   ECHOCARDIOGRAM COMPLETE  Result Date: 06/18/2019    ECHOCARDIOGRAM REPORT   Patient Name:   BTISHINA LOWNDate of Exam: 06/18/2019 Medical Rec #:  [id number]     Height:       60.0 in Accession #:  [id number]     Weight:       170.0 lb Date of Birth:  06-01-1932      BSA:          1.742 m Patient Age:    18 years       BP:           153/80 mmHg Patient Gender: F              HR:           78 bpm. Exam Location:  Inpatient Procedure: 2D Echo, Cardiac Doppler and Color Doppler Indications:    R55 Syncope  History:        Patient has prior history of Echocardiogram examinations, most                 recent 05/02/2017. Arrythmias:Atrial Fibrillation; Risk                 Factors:Dyslipidemia and GERD.  Sonographer:    Jonelle Sidle Dance Referring Phys: [id number] [place]  1. Left ventricular ejection fraction, by estimation, is 65 to 70%. The left ventricle has normal function. The left ventricle has no regional wall motion abnormalities. Left ventricular diastolic parameters are indeterminate.  2. Right ventricular systolic function is normal. The right ventricular size is normal. There is normal pulmonary artery systolic pressure.  3. Left atrial size was moderately dilated.  4. Right atrial size was severely dilated.  5. The mitral valve is normal in structure. Trivial mitral valve regurgitation. No evidence of mitral stenosis.  6. Tricuspid valve regurgitation is mild to moderate.  7. The aortic valve is normal in structure. Aortic valve regurgitation is not visualized. No aortic stenosis is present.  8. The inferior vena cava is normal in size with greater than 50% respiratory variability, suggesting right atrial pressure of 3 mmHg. FINDINGS  Left Ventricle: Left ventricular ejection fraction, by estimation, is 65 to 70%. The left ventricle has normal function. The left ventricle has no regional wall motion abnormalities. The left ventricular internal cavity size was normal in size. There is  no left ventricular hypertrophy. Left ventricular diastolic parameters are indeterminate. Right Ventricle: The right ventricular size is normal. No increase in right ventricular wall thickness. Right ventricular systolic function is normal. There is normal pulmonary artery systolic pressure. The tricuspid regurgitant velocity is 2.00 m/s, and  with an assumed right atrial pressure of 3 mmHg, the estimated right ventricular systolic pressure is 96.2 mmHg. Left Atrium: Left atrial size was moderately dilated. Right Atrium: Right atrial size was severely dilated. Pericardium: There is no evidence of pericardial effusion. Mitral Valve: The mitral valve is normal in structure. Normal mobility of the mitral valve leaflets. Trivial mitral valve regurgitation. No evidence of  mitral valve stenosis. Tricuspid Valve: The tricuspid valve is normal in structure. Tricuspid valve regurgitation is mild to moderate. No evidence of tricuspid stenosis. Aortic Valve: The aortic valve is normal in structure. Aortic valve regurgitation is not visualized. No aortic stenosis is present. Pulmonic Valve: The pulmonic valve was normal in structure. Pulmonic valve regurgitation is mild. No evidence of pulmonic stenosis. Aorta: The aortic root is normal in size and structure. Venous: The inferior vena cava was not well visualized. The inferior vena cava is normal in size with greater than 50% respiratory variability, suggesting right atrial pressure of 3 mmHg. IAS/Shunts: No atrial level shunt detected by color flow Doppler.  LEFT VENTRICLE PLAX 2D LVIDd:  3.70 cm LVIDs:         2.40 cm LV PW:         1.00 cm LV IVS:        0.90 cm LVOT diam:     1.90 cm LV SV:         42 LV SV Index:   24 LVOT Area:     2.84 cm  RIGHT VENTRICLE             IVC RV Basal diam:  3.00 cm     IVC diam: 1.50 cm RV Mid diam:    2.00 cm RV S prime:     11.00 cm/s TAPSE (M-mode): 2.0 cm LEFT ATRIUM             Index LA diam:        3.50 cm 2.01 cm/m LA Vol (A2C):   83.9 ml 48.16 ml/m LA Vol (A4C):   50.9 ml 29.22 ml/m LA Biplane Vol: 65.8 ml 37.77 ml/m  AORTIC VALVE LVOT Vmax:   74.65 cm/s LVOT Vmean:  45.150 cm/s LVOT VTI:    0.149 m  AORTA Ao Root diam: 3.40 cm Ao Asc diam:  3.00 cm MITRAL VALVE               TRICUSPID VALVE MV Area (PHT): 3.32 cm    TR Peak grad:   16.0 mmHg MV Decel Time: 229 msec    TR Vmax:        200.00 cm/s MV E velocity: 81.00 cm/s                            SHUNTS                            Systemic VTI:  0.15 m                            Systemic Diam: 1.90 cm Cherlynn Kaiser MD Electronically signed by Cherlynn Kaiser MD Signature Date/Time: 06/18/2019/1:24:08 PM    Final    Marzetta Board, MD, PhD [facility]  Between 7 am - 7 pm I am available, please contact me via Amion or  Securechat  Between 7 pm - 7 am I am not available, please contact night coverage MD/APP via Amion

## 2019-06-18 NOTE — Progress Notes (Signed)
Late Entry for 03.18.21 @ 2238. Report received from Conrad, staff RN at [facility] ED.   Late Entry for 03.18.21 @ 2305: New patient received from [facility] ED via stretcher. Admission vital signs, including height and weight obtained. Patient serves as historian. Side rails up x 2. Bed in lowest position and locked. Telemetry in place. No family present at bedside.

## 2019-06-19 DIAGNOSIS — M5136 Other intervertebral disc degeneration, lumbar region: Secondary | ICD-10-CM

## 2019-06-19 DIAGNOSIS — K648 Other hemorrhoids: Secondary | ICD-10-CM

## 2019-06-19 DIAGNOSIS — R197 Diarrhea, unspecified: Secondary | ICD-10-CM

## 2019-06-19 LAB — BASIC METABOLIC PANEL
Anion gap: 8 (ref 5–15)
BUN: 7 mg/dL — ABNORMAL LOW (ref 8–23)
CO2: 24 mmol/L (ref 22–32)
Calcium: 8.6 mg/dL — ABNORMAL LOW (ref 8.9–10.3)
Chloride: 105 mmol/L (ref 98–111)
Creatinine, Ser: 0.86 mg/dL (ref 0.44–1.00)
GFR calc Af Amer: >60 mL/min (ref >60)
GFR calc non Af Amer: >60 mL/min (ref >60)
Glucose, Bld: 83 mg/dL (ref 70–99)
Potassium: 3.8 mmol/L (ref 3.5–5.1)
Sodium: 137 mmol/L (ref 135–145)

## 2019-06-19 LAB — CBC
HCT: 36.7 % (ref 36.0–46.0)
Hemoglobin: 12.1 g/dL (ref 12.0–15.0)
MCH: 31.9 pg (ref 26.0–34.0)
MCHC: 33 g/dL (ref 30.0–36.0)
MCV: 96.8 fL (ref 80.0–100.0)
Platelets: 217 10*3/uL (ref 150–400)
RBC: 3.79 MIL/uL — ABNORMAL LOW (ref 3.87–5.11)
RDW: 12.9 % (ref 11.5–15.5)
WBC: 5.8 10*3/uL (ref 4.0–10.5)
nRBC: 0 % (ref 0.0–0.2)

## 2019-06-19 MED ORDER — CIPROFLOXACIN HCL 500 MG PO TABS: 500.0000 mg | ORAL_TABLET | Freq: Two times a day (BID) | ORAL | 0 refills | Status: AC

## 2019-06-19 MED ORDER — METRONIDAZOLE 500 MG PO TABS: 500.0000 mg | ORAL_TABLET | Freq: Three times a day (TID) | ORAL | 0 refills | Status: DC

## 2019-06-19 NOTE — Care Management Note (Signed)
Case Management Note  Patient Details  Name: Ashley Lawson MRN: [id number] Date of Birth: 1932-07-08  Subjective/Objective: 86 yo F admitted with syncopal episode at home. Hx of Afib, DDD and CKD. PT is is recommending HHPT. Met with pt at bedside. She plans to return home with her daughter who lives with her. She drives and her daughter can assist with transportation if prn. She denies any issues filling her prescriptions. Discussed PT recommendations. She reports that she doesn't have a preference for a [place] agency. Provided pt with a list of [place] agencies through [facility] [employer]. She chose [facility]. She reports that she is not sure if she used them before but they were nice. Contacted Jason at [facility] and he accepted the referral.              Action/Plan:   Expected Discharge Date:  06/19/19               Expected Discharge Plan:     In-House Referral:     Discharge planning Services     Post Acute Care Choice:    Choice offered to:     DME Arranged:    DME Agency:     HH Arranged:    HH Agency:     Status of Service:     If discussed at [employer], dates discussed:    Additional Comments:  Norina Buzzard, RN 06/19/2019, 3:04 PM

## 2019-06-19 NOTE — Discharge Summary (Signed)
Physician Discharge Summary  Ashley Lawson OAC:[id number] DOB: 02-Feb-1933 DOA: 06/17/2019  PCP: Burnis Medin, MD  Admit date: 06/17/2019 Discharge date: 06/19/2019  Admitted From: home Disposition:  home  Recommendations for Outpatient Follow-up:  1. Follow up with PCP in 1-2 weeks  Home Health: none Equipment/Devices: none  Discharge Condition: stable CODE STATUS: Full code Diet recommendation: regular  HPI: Per admitting MD, Ashley Lawson is a 86 y.o. female with medical history significant of chronic constipation, lumbar degenerative disc disease, compression fracture, diverticulosis, GERD, hepatic steatosis, hyperlipidemia, internal hemorrhoids, paroxysmal A. fib on Eliquis, polymyalgia rheumatica presents to emergency department after syncopal episode. Patient tells me that she has chronic constipation.  She took MiraLAX along with grape juice and ate several prunes this morning.  She then attempted to have a bowel movement and while she was sitting on the commode she developed severe abdominal pain and suddenly she fell on the ground.  She tells me that she has loss of consciousness for couple of seconds.  Her daughter called EMS and brought patient to the emergency department for further evaluation and management. No history of head trauma, seizure, urinary or bowel incontinence, fever, chills, nausea, vomiting, cough, congestion, chest pain, shortness of breath, palpitation, leg swelling. She takes Eliquis for A. fib.  She has internal hemorrhoids and was prescribed Anusol gel however she is not using it.  She noticed intermittent blood on toilet paper.  She lives with her daughter at home.  No history of smoking, alcohol, is a drug use.  Hospital Course / Discharge diagnoses: Principal Problem Syncopal episode -her episode was most likely vasovagal given association with abdominal cramps/diarrhea, CT head was without acute findings, telemetry was reviewed and unremarkable, she  underwent a 2D echo which was without significant findings.  She improved, returned to baseline, and will be discharged home in stable condition.  Active Problems Proctocolitis -Had lower abdominal pain associated with diarrhea, C. difficile was negative, GI pathogen panel was ordered but her diarrhea seems to be resolved.  Given leukocytosis on admission she was placed on metronidazole in ciprofloxacin, received 2 days while hospitalized with improvement in her symptoms, her abdominal pain resolved, she is able to tolerate a regular diet and her white count has normalized.  Given response to antibiotics will be given a short course with 3 additional days on discharge. Paroxysmal A. Fib -Continue Eliquis, diltiazem, she was in sinus during this admission Chronic kidney disease stage IIIa -Creatinine stable Internal hemorrhoids -Patient's occult blood test positive, however her hemoglobin has remained stable and she clinically does not have any bleeding.  She was maintained on Eliquis without issues Seasonal allergies -Continue home meds of Flonase, loratadine and Mucinex. Anxiety/insomnia -Continue Xanax as needed at bedtime. Chronic constipation  -resume home medications  Discharge Instructions   Allergies as of 06/19/2019      Reactions   Alendronate Sodium    upset stomach   Penicillins    Has patient had a PCN reaction causing immediate rash, facial/tongue/throat swelling, SOB or lightheadedness with hypotension: Yes Has patient had a PCN reaction causing severe rash involving mucus membranes or skin necrosis: No Has patient had a PCN reaction that required hospitalization: No Has patient had a PCN reaction occurring within the last 10 years: No If all of the above answers are "NO", then may proceed with Cephalosporin use.   Amoxicillin Rash   Has patient had a PCN reaction causing immediate rash, facial/tongue/throat swelling, SOB or lightheadedness with hypotension:  yes Has patient  had a PCN reaction causing severe rash involving mucus membranes or skin necrosis: no Has patient had a PCN reaction that required hospitalization: no Has patient had PCN reaction within the last 10 years: no  If all of the above answers are "NO", then may proceed with Cephalosporin use. REACTION: unspecified   Metronidazole Other (See Comments)   Pounding headaches patient says was bad.  With cipro for diverticulitis rx. Other reaction(s): Other (See Comments) Pounding headaches patient says was bad.With cipro for diverticulitis rx.      Medication List    STOP taking these medications   LORazepam 0.5 MG tablet Commonly known as: ATIVAN     TAKE these medications   acetaminophen 500 MG tablet Commonly known as: TYLENOL Take 500 mg by mouth every 6 (six) hours as needed for mild pain, moderate pain or headache.   ALPRAZolam 0.5 MG tablet Commonly known as: XANAX Take 0.5 mg by mouth at bedtime as needed for anxiety.   AMBULATORY NON FORMULARY MEDICATION Medication Name: Diltiazem 2% with lidocaine 5% Insert a pea size amount into rectum to the first knuckle TID   calcium carbonate 1250 (500 Ca) MG chewable tablet Commonly known as: OS-CAL Chew 1 tablet by mouth 2 (two) times daily.   calcium-vitamin D 500-200 MG-UNIT Tabs tablet Commonly known as: OSCAL WITH D Take by mouth.   ciprofloxacin 500 MG tablet Commonly known as: Cipro Take 1 tablet (500 mg total) by mouth 2 (two) times daily for 3 days.   conjugated estrogens vaginal cream Commonly known as: PREMARIN Place 1 Applicatorful vaginally daily as needed (dryness).   diltiazem 120 MG 24 hr capsule Commonly known as: CARDIZEM CD TAKE 1 CAPSULE BY MOUTH EVERY DAY What changed: how much to take   Eliquis 5 MG Tabs tablet Generic drug: apixaban TAKE 1 TABLET BY MOUTH TWICE A DAY What changed: how much to take   fluticasone 50 MCG/ACT nasal spray Commonly known as: FLONASE Place 2 sprays into both nostrils  daily.   guaiFENesin 600 MG 12 hr tablet Commonly known as: MUCINEX Take 600 mg by mouth 2 (two) times daily as needed (allergies).   loratadine 10 MG tablet Commonly known as: CLARITIN Take 10 mg by mouth daily as needed for allergies.   magnesium hydroxide 400 MG/5ML suspension Commonly known as: MILK OF MAGNESIA Take 15 mLs by mouth daily as needed for mild constipation.   metroNIDAZOLE 500 MG tablet Commonly known as: Flagyl Take 1 tablet (500 mg total) by mouth 3 (three) times daily for 3 days.   polyethylene glycol 17 g packet Commonly known as: MIRALAX / GLYCOLAX Take 17 g by mouth daily.   Restasis 0.05 % ophthalmic emulsion Generic drug: cycloSPORINE Place 1 drop into both eyes 2 (two) times daily.   vitamin C 100 MG tablet Take 100 mg by mouth daily.      Follow-up Information    Panosh, Standley Brooking, MD. Schedule an appointment as soon as possible for a visit in 2 week(s).   Specialties: Internal Medicine, Pediatrics Contact information: [place] [address] [phone]        Burnell Blanks, MD .   Specialty: Cardiology Contact information: [address] [place] [address] [phone]           Consultations:  NONE   Procedures/Studies:  CT Head Wo Contrast  Result Date: 06/17/2019 CLINICAL DATA:  Altered mental status EXAM: CT HEAD WITHOUT CONTRAST  TECHNIQUE: Contiguous axial images were obtained from the base of the skull through the vertex without intravenous contrast. COMPARISON:  09/17/2018 FINDINGS: Brain: No evidence of acute infarction, hemorrhage, hydrocephalus, extra-axial collection or mass lesion/mass effect. Scattered low-density changes within the periventricular and subcortical white matter compatible with chronic microvascular ischemic change. Moderate diffuse cerebral volume loss. Vascular: Atherosclerotic calcifications involving the large vessels of the skull base. No unexpected  hyperdense vessel. Skull: Normal. Negative for fracture or focal lesion. Sinuses/Orbits: No acute finding. Other: None. IMPRESSION: 1.  No acute intracranial findings. 2.  Chronic microvascular ischemic change and cerebral volume loss. Electronically Signed   By: Davina Poke D.O.   On: 06/17/2019 16:19   CT ABDOMEN PELVIS W CONTRAST  Result Date: 06/17/2019 CLINICAL DATA:  Abdominal abscess EXAM: CT ABDOMEN AND PELVIS WITH CONTRAST TECHNIQUE: Multidetector CT imaging of the abdomen and pelvis was performed using the standard protocol following bolus administration of intravenous contrast. CONTRAST:  53m OMNIPAQUE IOHEXOL 300 MG/ML  SOLN COMPARISON:  September 17, 2018 FINDINGS: Lower chest: The lung bases are clear. The heart size is normal. Hepatobiliary: The liver is normal. Normal gallbladder.There is no biliary ductal dilation. Pancreas: Normal contours without ductal dilatation. No peripancreatic fluid collection. Spleen: No splenic laceration or hematoma. Adrenals/Urinary Tract: --Adrenal glands: No adrenal hemorrhage. --Right kidney/ureter: No hydronephrosis or perinephric hematoma. --Left kidney/ureter: No hydronephrosis or perinephric hematoma. --Urinary bladder: Unremarkable. Stomach/Bowel: --Stomach/Duodenum: No hiatal hernia or other gastric abnormality. Normal duodenal course and caliber. --Small bowel: No dilatation or inflammation. --Colon: There is mild diffuse wall thickening at the level of the rectum. There is scattered colonic diverticula without CT evidence for diverticulitis. --Appendix: Normal. Vascular/Lymphatic: Atherosclerotic calcification is present within the non-aneurysmal abdominal aorta, without hemodynamically significant stenosis. --No retroperitoneal lymphadenopathy. --No mesenteric lymphadenopathy. --No pelvic or inguinal lymphadenopathy. Reproductive: Status post hysterectomy. No adnexal mass. Other: No ascites or free air. The abdominal wall is normal. Musculoskeletal. No  acute displaced fractures. IMPRESSION: 1. Mild diffuse wall thickening at the level of the rectum, which may be seen in the setting of proctocolitis. 2. Colonic diverticulosis without diverticulitis. 3. Aortic Atherosclerosis (ICD10-I70.0). Electronically Signed   By: CConstance HolsterM.D.   On: 06/17/2019 16:20   ECHOCARDIOGRAM COMPLETE  Result Date: 06/18/2019    ECHOCARDIOGRAM REPORT   Patient Name:   Ashley CORNEDate of Exam: 06/18/2019 Medical Rec #:  [id number]     Height:       60.0 in Accession #:    [id number]    Weight:       170.0 lb Date of Birth:  303-16-1934     BSA:          1.742 m Patient Age:    89years       BP:           153/80 mmHg Patient Gender: F              HR:           78 bpm. Exam Location:  Inpatient Procedure: 2D Echo, Cardiac Doppler and Color Doppler Indications:    R55 Syncope  History:        Patient has prior history of Echocardiogram examinations, most                 recent 05/02/2017. Arrythmias:Atrial Fibrillation; Risk                 Factors:Dyslipidemia and GERD.  Sonographer:  Tiffany Dance Referring Phys: [id number] Dante  1. Left ventricular ejection fraction, by estimation, is 65 to 70%. The left ventricle has normal function. The left ventricle has no regional wall motion abnormalities. Left ventricular diastolic parameters are indeterminate.  2. Right ventricular systolic function is normal. The right ventricular size is normal. There is normal pulmonary artery systolic pressure.  3. Left atrial size was moderately dilated.  4. Right atrial size was severely dilated.  5. The mitral valve is normal in structure. Trivial mitral valve regurgitation. No evidence of mitral stenosis.  6. Tricuspid valve regurgitation is mild to moderate.  7. The aortic valve is normal in structure. Aortic valve regurgitation is not visualized. No aortic stenosis is present.  8. The inferior vena cava is normal in size with greater than 50% respiratory  variability, suggesting right atrial pressure of 3 mmHg. FINDINGS  Left Ventricle: Left ventricular ejection fraction, by estimation, is 65 to 70%. The left ventricle has normal function. The left ventricle has no regional wall motion abnormalities. The left ventricular internal cavity size was normal in size. There is  no left ventricular hypertrophy. Left ventricular diastolic parameters are indeterminate. Right Ventricle: The right ventricular size is normal. No increase in right ventricular wall thickness. Right ventricular systolic function is normal. There is normal pulmonary artery systolic pressure. The tricuspid regurgitant velocity is 2.00 m/s, and  with an assumed right atrial pressure of 3 mmHg, the estimated right ventricular systolic pressure is 37.8 mmHg. Left Atrium: Left atrial size was moderately dilated. Right Atrium: Right atrial size was severely dilated. Pericardium: There is no evidence of pericardial effusion. Mitral Valve: The mitral valve is normal in structure. Normal mobility of the mitral valve leaflets. Trivial mitral valve regurgitation. No evidence of mitral valve stenosis. Tricuspid Valve: The tricuspid valve is normal in structure. Tricuspid valve regurgitation is mild to moderate. No evidence of tricuspid stenosis. Aortic Valve: The aortic valve is normal in structure. Aortic valve regurgitation is not visualized. No aortic stenosis is present. Pulmonic Valve: The pulmonic valve was normal in structure. Pulmonic valve regurgitation is mild. No evidence of pulmonic stenosis. Aorta: The aortic root is normal in size and structure. Venous: The inferior vena cava was not well visualized. The inferior vena cava is normal in size with greater than 50% respiratory variability, suggesting right atrial pressure of 3 mmHg. IAS/Shunts: No atrial level shunt detected by color flow Doppler.  LEFT VENTRICLE PLAX 2D LVIDd:         3.70 cm LVIDs:         2.40 cm LV PW:         1.00 cm LV IVS:         0.90 cm LVOT diam:     1.90 cm LV SV:         42 LV SV Index:   24 LVOT Area:     2.84 cm  RIGHT VENTRICLE             IVC RV Basal diam:  3.00 cm     IVC diam: 1.50 cm RV Mid diam:    2.00 cm RV S prime:     11.00 cm/s TAPSE (M-mode): 2.0 cm LEFT ATRIUM             Index LA diam:        3.50 cm 2.01 cm/m LA Vol (A2C):   83.9 ml 48.16 ml/m LA Vol (A4C):   50.9 ml 29.22 ml/m LA Biplane  Vol: 65.8 ml 37.77 ml/m  AORTIC VALVE LVOT Vmax:   74.65 cm/s LVOT Vmean:  45.150 cm/s LVOT VTI:    0.149 m  AORTA Ao Root diam: 3.40 cm Ao Asc diam:  3.00 cm MITRAL VALVE               TRICUSPID VALVE MV Area (PHT): 3.32 cm    TR Peak grad:   16.0 mmHg MV Decel Time: 229 msec    TR Vmax:        200.00 cm/s MV E velocity: 81.00 cm/s                            SHUNTS                            Systemic VTI:  0.15 m                            Systemic Diam: 1.90 cm Cherlynn Kaiser MD Electronically signed by Cherlynn Kaiser MD Signature Date/Time: 06/18/2019/1:24:08 PM    Final       Subjective: - no chest pain, shortness of breath, no abdominal pain, nausea or vomiting.   Discharge Exam: BP (!) 147/79 (BP Location: Right Arm)   Pulse 75   Temp 98.2 F (36.8 C) (Oral)   Resp 18   Ht 5' (1.524 m)   Wt 73.1 kg Comment: scale b  SpO2 99%   BMI 31.48 kg/m   General: Pt is alert, awake, not in acute distress Cardiovascular: RRR, S1/S2 +, no rubs, no gallops Respiratory: CTA bilaterally, no wheezing, no rhonchi Abdominal: Soft, NT, ND, bowel sounds + Extremities: no edema, no cyanosis    The results of significant diagnostics from this hospitalization (including imaging, microbiology, ancillary and laboratory) are listed below for reference.     Microbiology: Recent Results (from the past 240 hour(s))  C difficile quick scan w PCR reflex     Status: None   Collection Time: 06/17/19  3:02 PM   Specimen: STOOL  Result Value Ref Range Status   C Diff antigen NEGATIVE NEGATIVE Final   C Diff toxin  NEGATIVE NEGATIVE Final   C Diff interpretation No C. difficile detected.  Final    Comment: Performed at [facility], [place] [address], [place], [place] [address]  SARS CORONAVIRUS 2 (TAT 6-24 HRS) Nasopharyngeal Nasopharyngeal Swab     Status: None   Collection Time: 06/17/19  6:41 PM   Specimen: Nasopharyngeal Swab  Result Value Ref Range Status   SARS Coronavirus 2 NEGATIVE NEGATIVE Final    Comment: (NOTE) SARS-CoV-2 target nucleic acids are NOT DETECTED. The SARS-CoV-2 RNA is generally detectable in upper and lower respiratory specimens during the acute phase of infection. Negative results do not preclude SARS-CoV-2 infection, do not rule out co-infections with other pathogens, and should not be used as the sole basis for treatment or other patient management decisions. Negative results must be combined with clinical observations, patient history, and epidemiological information. The expected result is Negative. Fact Sheet for Patients: SugarRoll.be Fact Sheet for Healthcare Providers: https://www.woods-mathews.com/ This test is not yet approved or cleared by the [place] FDA and  has been authorized for detection and/or diagnosis of SARS-CoV-2 by FDA under an Emergency Use Authorization (EUA). This EUA will remain  in effect (meaning this test can be used)  for the duration of the COVID-19 declaration under Section 56 4(b)(1) of the Act, 21 U.S.C. section 360bbb-3(b)(1), unless the authorization is terminated or revoked sooner. Performed at [facility], [place] [address], [place], [place] [address]      Labs: Basic Metabolic Panel: Recent Labs  Lab 06/17/19 1422 06/17/19 1947 06/18/19 0326 06/19/19 0353  NA 137  --  139 137  K 3.7  --  3.2* 3.8  CL 101  --  105 105  CO2 22  --  23 24  GLUCOSE 125*  --  91 83  BUN 12  --  6* 7*  CREATININE 1.01*  --  0.86 0.86  CALCIUM 9.2  --  8.6* 8.6*  MG  --  1.6*  --    --    Liver Function Tests: Recent Labs  Lab 06/17/19 1422 06/18/19 0326  AST 25 17  ALT 22 16  ALKPHOS 60 48  BILITOT 0.8 0.8  PROT 6.6 5.4*  ALBUMIN 3.8 3.0*   CBC: Recent Labs  Lab 06/17/19 1422 06/18/19 0326 06/19/19 0353  WBC 11.9* 6.7 5.8  NEUTROABS 9.3*  --   --   HGB 13.7 12.1 12.1  HCT 42.0 36.1 36.7  MCV 98.4 95.3 96.8  PLT 249 213 217   CBG: No results for input(s): GLUCAP in the last 168 hours. Hgb A1c No results for input(s): HGBA1C in the last 72 hours. Lipid Profile No results for input(s): CHOL, HDL, LDLCALC, TRIG, CHOLHDL, LDLDIRECT in the last 72 hours. Thyroid function studies No results for input(s): TSH, T4TOTAL, T3FREE, THYROIDAB in the last 72 hours.  Invalid input(s): FREET3 Urinalysis    Component Value Date/Time   COLORURINE STRAW (A) 06/17/2019 1735   APPEARANCEUR CLEAR 06/17/2019 1735   LABSPEC 1.028 06/17/2019 1735   PHURINE 8.0 06/17/2019 1735   GLUCOSEU NEGATIVE 06/17/2019 1735   GLUCOSEU NEGATIVE 01/18/2019 [place] 06/17/2019 1735   HGBUR 2+ 08/01/2009 1508   BILIRUBINUR NEGATIVE 06/17/2019 1735   BILIRUBINUR - 04/07/2019 1610   KETONESUR NEGATIVE 06/17/2019 1735   PROTEINUR NEGATIVE 06/17/2019 1735   UROBILINOGEN 0.2 04/07/2019 1610   UROBILINOGEN 0.2 01/18/2019 1555   NITRITE NEGATIVE 06/17/2019 1735   LEUKOCYTESUR NEGATIVE 06/17/2019 1735    FURTHER DISCHARGE INSTRUCTIONS:   Get Medicines reviewed and adjusted: Please take all your medications with you for your next visit with your Primary MD   Laboratory/radiological data: Please request your Primary MD to go over all hospital tests and procedure/radiological results at the follow up, please ask your Primary MD to get all Hospital records sent to his/her office.   In some cases, they will be blood work, cultures and biopsy results pending at the time of your discharge. Please request that your primary care M.D. goes through all the records of your  hospital data and follows up on these results.   Also Note the following: If you experience worsening of your admission symptoms, develop shortness of breath, life threatening emergency, suicidal or homicidal thoughts you must seek medical attention immediately by calling 911 or calling your MD immediately  if symptoms less severe.   You must read complete instructions/literature along with all the possible adverse reactions/side effects for all the Medicines you take and that have been prescribed to you. Take any new Medicines after you have completely understood and accpet all the possible adverse reactions/side effects.    Do not drive when taking Pain medications or sleeping medications (Benzodaizepines)  Do not take more than prescribed Pain, Sleep and Anxiety Medications. It is not advisable to combine anxiety,sleep and pain medications without talking with your primary care practitioner   Special Instructions: If you have smoked or chewed Tobacco  in the last 2 yrs please stop smoking, stop any regular Alcohol  and or any Recreational drug use.   Wear Seat belts while driving.   Please note: You were cared for by a hospitalist during your hospital stay. Once you are discharged, your primary care physician will handle any further medical issues. Please note that NO REFILLS for any discharge medications will be authorized once you are discharged, as it is imperative that you return to your primary care physician (or establish a relationship with a primary care physician if you do not have one) for your post hospital discharge needs so that they can reassess your need for medications and monitor your lab values.  Time coordinating discharge: 40 minutes  SIGNED:  Marzetta Board, MD, PhD 06/19/2019, 8:15 AM

## 2019-06-19 NOTE — Progress Notes (Signed)
Patient alert and oriented, denies pain, VSS iv and tele removed, d/c instruction explain and given to the patient and daughter, all questions answered to patient and family satisfaction. Pt. D/c home per order.

## 2019-06-21 ENCOUNTER — Telehealth: Payer: Self-pay | Admitting: *Deleted

## 2019-06-21 ENCOUNTER — Telehealth: Payer: Self-pay | Admitting: Internal Medicine

## 2019-06-21 NOTE — Telephone Encounter (Signed)
Okay for verbal orders? Please advise 

## 2019-06-21 NOTE — Telephone Encounter (Signed)
[place] from [facility] is needing verbal orders for PT, her frequency is 2w2   Hope can be reached at [phone]   Informed her that Panosh is on vacation and not sure if someone will be able to give her orders until she gets back.

## 2019-06-21 NOTE — Telephone Encounter (Signed)
Transition Care Management Follow-up Telephone Call   Date discharged? June 19, 2019    How have you been since you were released from the hospital? "I'll be glad to stop taking these antibiotics"    Do you understand why you were in the hospital? yes   Do you understand the discharge instructions? yes   Where were you discharged to? Home    Items Reviewed:  Medications reviewed: yes  Allergies reviewed: yes  Dietary changes reviewed: N/A  Referrals reviewed: N/A   Functional Questionnaire:   Activities of Daily Living (ADLs):   She states they are independent in the following: ambulation, bathing and hygiene, feeding, continence, grooming, toileting and dressing States they require assistance with the following: N/A    Any transportation issues/concerns?: no   Any patient concerns? no   Confirmed importance and date/time of follow-up visits scheduled yes  Provider Appointment booked with Dr. Regis Bill on Monday 06/28/2019 at [facility]  Confirmed with patient if condition begins to worsen call PCP or go to the ER.  Patient was given the office number and encouraged to call back with question or concerns.  : yes

## 2019-06-22 NOTE — Telephone Encounter (Signed)
Verbal orders given to [facility]

## 2019-06-22 NOTE — Telephone Encounter (Signed)
Please okay the orders  

## 2019-06-27 NOTE — Progress Notes (Signed)
This visit occurred during the SARS-CoV-2 public health emergency.  Safety protocols were in place, including screening questions prior to the visit, additional usage of staff PPE, and extensive cleaning of exam room while observing appropriate contact time as indicated for disinfecting solutions.    Chief Complaint  Patient presents with  . Hospitalization Follow-up    HPI: Ashley Lawson 86 y.o. come in forhosp fu Had onset of sever LLQ pain and  Fainted at toilet  The pain was the worst she ever had  And  Had uncontrolled bm in ambulance . No vomiting  Had no cp sob palpitation before syncope and no fever On flomax but makes her dizzy some and handt had this then   Had anxeity and was given alprazolam in hosp with help( never picked up last rx of   Lorazepam)  Told to take MOM instead of miralax for bowels issues   Had ct scan and cw proctocolitis and rx with metro and cipro for 5 days and is a lot better but  Still has  Discomfort.   Hospital Course / Discharge diagnoses: Principal Problem Syncopal episode -her episode was most likely vasovagal given association with abdominal cramps/diarrhea, CT head was without acute findings, telemetry was reviewed and unremarkable, she underwent a 2D echo which was without significant findings.  She improved, returned to baseline, and will be discharged home in stable condition.  Active Problems Proctocolitis -Had lower abdominal pain associated with diarrhea, C. difficile was negative, GI pathogen panel was ordered but her diarrhea seems to be resolved.  Given leukocytosis on admission she was placed on metronidazole in ciprofloxacin, received 2 days while hospitalized with improvement in her symptoms, her abdominal pain resolved, she is able to tolerate a regular diet and her white count has normalized.  Given response to antibiotics will be given a short course with 3 additional days on discharge. Paroxysmal A. Fib -Continue Eliquis, diltiazem,  she was in sinus during this admission Chronic kidney disease stage IIIa -Creatinine stable Internal hemorrhoids -Patient's occult blood test positive, however her hemoglobin has remained stable and she clinically does not have any bleeding.  She was maintained on Eliquis without issues Seasonal allergies-Continue home meds of Flonase, loratadine and Mucinex. Anxiety/insomnia -Continue Xanax as needed at bedtime. Chronic constipation -resume home medications t  ROS: See pertinent positives and negatives per HPI.  Past Medical History:  Diagnosis Date  . Abdominal pain 12/2009   hospitalized  . Allergic rhinitis   . Chronic constipation   . Compression fx, lumbar spine ([place])   . DDD (degenerative disc disease), lumbar   . Diverticulosis of colon   . Dog bite(E906.0) 08/30/2011  . GERD (gastroesophageal reflux disease)   . Hepatic steatosis   . Hyperlipidemia   . Hyperplastic colon polyp   . IBS (irritable bowel syndrome)    constipation predominant  . Internal hemorrhoids   . Osteoporosis    dexa 2011 -2.7 hip nl spine  . PAF (paroxysmal atrial fibrillation) ([place])    a. dx 05/2017.  Marland Kitchen PMR (polymyalgia rheumatica) (HCC) 08/30/2011   under rheum care  on low dose pred 5 mg   . Renal disorder     Family History  Problem Relation Age of Onset  . Other Mother        fx hip  . Other Sister        fx hip  . Bladder Cancer Brother   . Colon cancer Neg Hx   . Esophageal cancer Neg  Hx   . Stomach cancer Neg Hx   . Rectal cancer Neg Hx   . Liver cancer Neg Hx     Social History   Socioeconomic History  . Marital status: Widowed    Spouse name: Not on file  . Number of children: Not on file  . Years of education: Not on file  . Highest education level: Not on file  Occupational History  . Occupation: retired  Tobacco Use  . Smoking status: Never Smoker  . Smokeless tobacco: Never Used  Substance and Sexual Activity  . Alcohol use: Yes    Alcohol/week: 0.0 standard  drinks    Comment: occ  . Drug use: No  . Sexual activity: Not on file  Other Topics Concern  . Not on file  Social History Narrative   Retired   Regular exercise- yes   Widowed   At home with children and GKs    HH of 2     Puppy   Plays cards is social and active    Social Determinants of Health   Financial Resource Strain:   . Difficulty of Paying Living Expenses:   Food Insecurity:   . Worried About Charity fundraiser in the Last Year:   . Arboriculturist in the Last Year:   Transportation Needs:   . Film/video editor (Medical):   Marland Kitchen Lack of Transportation (Non-Medical):   Physical Activity:   . Days of Exercise per Week:   . Minutes of Exercise per Session:   Stress:   . Feeling of Stress :   Social Connections:   . Frequency of Communication with Friends and Family:   . Frequency of Social Gatherings with Friends and Family:   . Attends Religious Services:   . Active Member of Clubs or Organizations:   . Attends Archivist Meetings:   Marland Kitchen Marital Status:     Outpatient Medications Prior to Visit  Medication Sig Dispense Refill  . acetaminophen (TYLENOL) 500 MG tablet Take 500 mg by mouth every 6 (six) hours as needed for mild pain, moderate pain or headache.    . ALPRAZolam (XANAX) 0.5 MG tablet Take 0.5 mg by mouth at bedtime as needed for anxiety.    . AMBULATORY NON FORMULARY MEDICATION Medication Name: Diltiazem 2% with lidocaine 5% Insert a pea size amount into rectum to the first knuckle TID 30 g 3  . Ascorbic Acid (VITAMIN C) 100 MG tablet Take 100 mg by mouth daily.      . calcium carbonate (OS-CAL) 1250 (500 Ca) MG chewable tablet Chew 1 tablet by mouth 2 (two) times daily.    . calcium-vitamin D (OSCAL WITH D) 500-200 MG-UNIT TABS tablet Take by mouth.    . conjugated estrogens (PREMARIN) vaginal cream Place 1 Applicatorful vaginally daily as needed (dryness).    Marland Kitchen diltiazem (CARDIZEM CD) 120 MG 24 hr capsule TAKE 1 CAPSULE BY MOUTH EVERY  DAY (Patient taking differently: Take 120 mg by mouth daily. ) 90 capsule 3  . ELIQUIS 5 MG TABS tablet TAKE 1 TABLET BY MOUTH TWICE A DAY (Patient taking differently: Take 5 mg by mouth 2 (two) times daily. ) 180 tablet 1  . fluticasone (FLONASE) 50 MCG/ACT nasal spray Place 2 sprays into both nostrils daily.    Marland Kitchen guaiFENesin (MUCINEX) 600 MG 12 hr tablet Take 600 mg by mouth 2 (two) times daily as needed (allergies).    . loratadine (CLARITIN) 10 MG tablet Take 10  mg by mouth daily as needed for allergies.    . magnesium hydroxide (MILK OF MAGNESIA) 400 MG/5ML suspension Take 15 mLs by mouth daily as needed for mild constipation.    . polyethylene glycol (MIRALAX / GLYCOLAX) packet Take 17 g by mouth daily.    . RESTASIS 0.05 % ophthalmic emulsion Place 1 drop into both eyes 2 (two) times daily.  3   No facility-administered medications prior to visit.     EXAM:  BP (!) 148/78   Pulse 77   Temp 97.9 F (36.6 C) (Other (Comment))   Ht 5' (1.524 m)   Wt 161 lb (73 kg)   BMI 31.44 kg/m   Body mass index is 31.44 kg/m.  GENERAL: vitals reviewed and listed above, alert, oriented, appears well hydrated and in no acute distress HEENT: atraumatic, conjunctiva  clear, no obvious abnormalities on inspection of external nose and ears OP : masked  NECK: no obvious masses on inspection palpation  LUNGS: clear to auscultation bilaterally, no wheezes, rales or rhonchi, good air movement CV: HRRR, no clubbing cyanosis or  peripheral edema nl cap refill  Abdomen:  Sof,t normal bowel sounds without hepatosplenomegaly, no guarding rebound or masses no CVA tenderness but tender llq and pelvoc area  No rebound  MS: moves all extremities without noticeable focal  Abnormality OA changes  PSYCH: pleasant and cooperative,baseline anxiety  Lab Results  Component Value Date   WBC 5.8 06/19/2019   HGB 12.1 06/19/2019   HCT 36.7 06/19/2019   PLT 217 06/19/2019   GLUCOSE 83 06/19/2019   CHOL 202 (H)  04/07/2019   TRIG 203.0 (H) 04/07/2019   HDL 67.30 04/07/2019   LDLDIRECT 100.0 04/07/2019   LDLCALC 105 (H) 04/08/2018   ALT 16 06/18/2019   AST 17 06/18/2019   NA 137 06/19/2019   K 3.8 06/19/2019   CL 105 06/19/2019   CREATININE 0.86 06/19/2019   BUN 7 (L) 06/19/2019   CO2 24 06/19/2019   TSH 2.39 04/07/2019   BP Readings from Last 3 Encounters:  06/28/19 (!) 148/78  06/19/19 125/80  06/04/19 122/80    ASSESSMENT AND PLAN:  Discussed the following assessment and plan:  [facility] discharge follow-up  Syncope, unspecified syncope type  Abdominal pain, LLQ - recurrent  Anxiety  Atrial fibrillation, unspecified type (HCC)  Anticoagulant long-term use S/p hosp for abd pain proctocolitis and prob secondary  syncopal  Felt to  And rx with short course  cipro flagyl  Had severe pain  Heme pos stool has nrmorrhoids and lab stable  Advise we get her GI team to see her in follow up.  Hold the flomax causing dizziness for now  Then caution with tthe lorazepam that was never picked up  Given alprazolam when in hospital  BP lower on second read   Follow  Refill lorazepam  Instead of alprazolam.  -Patient advised to return or notify health care team  if  new concerns arise.  Patient Instructions  Will refill the lorazepam as needed.  For anxiety  Caution   To avoid falls.   .  Ask  Urology about the tamzulosin   can cause dizziness.  I agree may be good to hold for now   Plan  Fu appt with GI  Amy Shiloh or dr Carlean Purl  because you had and episode of colitis and requited antibiotics again .  I will send them a message  And you can call also.     Repeat  BP   Is 148/78 today .     Standley Brooking. Royetta Probus M.D.

## 2019-06-28 ENCOUNTER — Encounter: Payer: Self-pay | Admitting: Internal Medicine

## 2019-06-28 ENCOUNTER — Ambulatory Visit (INDEPENDENT_AMBULATORY_CARE_PROVIDER_SITE_OTHER): Payer: Medicare Other | Admitting: Internal Medicine

## 2019-06-28 ENCOUNTER — Other Ambulatory Visit: Payer: Self-pay

## 2019-06-28 VITALS — BP 148/78 | HR 77 | Temp 97.9°F | Ht 60.0 in | Wt 161.0 lb

## 2019-06-28 DIAGNOSIS — R1032 Left lower quadrant pain: Secondary | ICD-10-CM

## 2019-06-28 DIAGNOSIS — K529 Noninfective gastroenteritis and colitis, unspecified: Secondary | ICD-10-CM

## 2019-06-28 DIAGNOSIS — F419 Anxiety disorder, unspecified: Secondary | ICD-10-CM

## 2019-06-28 DIAGNOSIS — Z09 Encounter for follow-up examination after completed treatment for conditions other than malignant neoplasm: Secondary | ICD-10-CM

## 2019-06-28 DIAGNOSIS — R55 Syncope and collapse: Secondary | ICD-10-CM

## 2019-06-28 DIAGNOSIS — I4891 Unspecified atrial fibrillation: Secondary | ICD-10-CM

## 2019-06-28 DIAGNOSIS — Z7901 Long term (current) use of anticoagulants: Secondary | ICD-10-CM

## 2019-06-28 MED ORDER — LORAZEPAM 0.5 MG PO TABS: ORAL_TABLET | ORAL | 0 refills | Status: DC

## 2019-06-28 NOTE — Patient Instructions (Addendum)
Will refill the lorazepam as needed.  For anxiety  Caution   To avoid falls.   .  Ask  Urology about the tamzulosin   can cause dizziness.  I agree may be good to hold for now   Plan  Fu appt with GI  A[facility] or dr Carlean Purl  because you had and episode of colitis and requited antibiotics again .  I will send them a message  And you can call also.     Repeat BP   Is 148/78 today .

## 2019-06-29 NOTE — Progress Notes (Signed)
Ashley Lawson, Dr Regis Bill requesting GI appt for this pt- not sure who primary GI MD is - please make her an appt with primary Gi  if available in next month, or me

## 2019-06-30 ENCOUNTER — Ambulatory Visit
Admission: RE | Admit: 2019-06-30 | Discharge: 2019-06-30 | Disposition: A | Discharge status: Home / Self Care | Payer: Medicare Other | Source: Ambulatory Visit | Attending: Internal Medicine | Admitting: Internal Medicine

## 2019-06-30 ENCOUNTER — Other Ambulatory Visit: Payer: Self-pay

## 2019-06-30 DIAGNOSIS — Z1231 Encounter for screening mammogram for malignant neoplasm of breast: Secondary | ICD-10-CM

## 2019-07-09 ENCOUNTER — Telehealth: Payer: Self-pay | Admitting: Physician Assistant

## 2019-07-09 NOTE — Telephone Encounter (Signed)
I called and spoke with patient, she is aware that it is ok for her daughter Santiago Glad to come with her to her appointment with her 07/14/19.

## 2019-07-09 NOTE — Telephone Encounter (Signed)
Ashley Lawson wants to know if she can come with patient to her appt as patient doesn't always remember what is told her at her appts.

## 2019-07-13 ENCOUNTER — Telehealth: Payer: Self-pay | Admitting: Physician Assistant

## 2019-07-13 NOTE — Telephone Encounter (Signed)
Patient tells me she had a spell yesterday where she felt the urge to have her bowel movement. She was not able to control it and was incontinent. She was taking daily Miralax. She says she was instructed to stop the Miralax by her PCP due "essentially cleaning myself out every day."  She states she also eats prunes for her bowel movement. Asks if she could take MOM. Advised not to take anything. Moved her appointment to 07/15/19. Asked she wait and let Dr Carlean Purl sort this out. Patient says she will just eat her prunes.

## 2019-07-13 NOTE — Telephone Encounter (Signed)
Patient calling stating that she was in the hospital about 3 weeks ago and they told her she had diverticulitis. She is asking what she can take to help her go to the bathroom/control her bowels. States sometimes she can't go and sometimes she can't control her bowel movements. States the hospital did not tell her anything.

## 2019-07-14 ENCOUNTER — Other Ambulatory Visit: Payer: Self-pay

## 2019-07-14 ENCOUNTER — Encounter: Payer: Self-pay | Admitting: Physician Assistant

## 2019-07-14 ENCOUNTER — Encounter: Payer: Self-pay | Admitting: *Deleted

## 2019-07-14 ENCOUNTER — Ambulatory Visit: Payer: Medicare Other | Admitting: Physician Assistant

## 2019-07-14 VITALS — BP 128/74 | HR 74 | Ht 60.0 in | Wt 161.4 lb

## 2019-07-14 DIAGNOSIS — I480 Paroxysmal atrial fibrillation: Secondary | ICD-10-CM

## 2019-07-14 DIAGNOSIS — I340 Nonrheumatic mitral (valve) insufficiency: Secondary | ICD-10-CM | POA: Diagnosis not present

## 2019-07-14 DIAGNOSIS — R0789 Other chest pain: Secondary | ICD-10-CM | POA: Diagnosis not present

## 2019-07-14 DIAGNOSIS — R55 Syncope and collapse: Secondary | ICD-10-CM | POA: Diagnosis not present

## 2019-07-14 NOTE — Progress Notes (Signed)
Patient ID: Ashley Lawson, female   DOB: 21-Jul-1932, 86 y.o.   MRN: [id number] Patient enrolled for [employer] to ship a 30 day cardiac event monitor to her home.

## 2019-07-14 NOTE — Progress Notes (Signed)
Cardiology Office Note:    Date:  07/14/2019   ID:  Ashley Lawson, DOB September 04, 1932, MRN [id number]  PCP:  Burnis Medin, MD  Cardiologist:  Lauree Chandler, MD   Electrophysiologist:  None   Referring MD: Burnis Medin, MD   Chief Complaint:  Hospitalization Follow-up (Syncope)    Patient Profile:    Ashley Lawson is a 86 y.o. female with:   Paroxysmal atrial fibrillation  CHA2DS2-VASc=3 (female, age x 2) >> Apixaban    Polymyalgia rheumatica  Hyperlipidemia  GERD  Diverticulosis  IBS  Positional vertigo  Lumbar DDD  Mitral regurgitation  Echo 2/19: Mild MR  Echo 05/2019: Trivial MR, EF 65-70, BAE  Prior CV studies: Echocardiogram 06/18/2019 EF 65-70, no RWMA, normal RV SF, moderate LAE, severe RAE, trivial MR, mild-moderate TR  Echocardiogram 05/02/2017 Mild concentric LVH, EF 55-60, mild MR, mild RAE, mild TR, PASP 32  History of Present Illness:    Ashley Lawson was last seen by Dr. Angelena Form in July 2020.  She was admitted 3/18-3/20 with syncope.  This occurred in the context of severe abdominal pain during a bowel movement.  Head CT was neg.  Tele did shows transient, rate controlled atrial fibrillation on 06/18/2019 but there were no other significant arrhythmias (personally reviewed).  I also personally reviewed the ECGs obtained via EMS and these showed rate controlled atrial fibrillation as well.  Echocardiogram demonstrated normal LVF.  She was tx with antibiotics for proctocolitis.    She returns for follow up along with her daughter today.  She has had recurrent LLQ abdominal pain and has an appt to see GI tomorrow.  She has not had any bloody BMs.  She has not had further syncope. She does have a lot of abdominal pain and sometimes has chest pain associated with it.  She has not had exertional chest pain or significant shortness of breath.  She has not had significant leg swelling.     Past Medical History:  Diagnosis Date  . Abdominal pain  12/2009   hospitalized  . Allergic rhinitis   . Chronic constipation   . Compression fx, lumbar spine ([place])   . DDD (degenerative disc disease), lumbar   . Diverticulosis of colon   . Dog bite(E906.0) 08/30/2011  . GERD (gastroesophageal reflux disease)   . Hepatic steatosis   . Hyperlipidemia   . Hyperplastic colon polyp   . IBS (irritable bowel syndrome)    constipation predominant  . Internal hemorrhoids   . Osteoporosis    dexa 2011 -2.7 hip nl spine  . PAF (paroxysmal atrial fibrillation) ([place])    a. dx 05/2017.  Marland Kitchen PMR (polymyalgia rheumatica) (HCC) 08/30/2011   under rheum care  on low dose pred 5 mg   . Renal disorder     Current Medications: Current Meds  Medication Sig  . acetaminophen (TYLENOL) 500 MG tablet Take 500 mg by mouth every 6 (six) hours as needed for mild pain, moderate pain or headache.  . ALPRAZolam (XANAX) 0.5 MG tablet Take 0.5 mg by mouth at bedtime as needed for anxiety.  . AMBULATORY NON FORMULARY MEDICATION Medication Name: Diltiazem 2% with lidocaine 5% Insert a pea size amount into rectum to the first knuckle TID  . Ascorbic Acid (VITAMIN C) 100 MG tablet Take 100 mg by mouth daily.    . calcium carbonate (OS-CAL) 1250 (500 Ca) MG chewable tablet Chew 1 tablet by mouth 2 (two) times daily.  Marland Kitchen conjugated estrogens (PREMARIN) vaginal cream  Place 1 Applicatorful vaginally daily as needed (dryness).  Marland Kitchen diltiazem (CARDIZEM CD) 120 MG 24 hr capsule TAKE 1 CAPSULE BY MOUTH EVERY DAY  . ELIQUIS 5 MG TABS tablet TAKE 1 TABLET BY MOUTH TWICE A DAY  . fluticasone (FLONASE) 50 MCG/ACT nasal spray Place 2 sprays into both nostrils daily.  Marland Kitchen guaiFENesin (MUCINEX) 600 MG 12 hr tablet Take 600 mg by mouth 2 (two) times daily as needed (allergies).  . loratadine (CLARITIN) 10 MG tablet Take 10 mg by mouth daily as needed for allergies.  Marland Kitchen LORazepam (ATIVAN) 0.5 MG tablet TAKE 1-2 TABLETS BY MOUTH 2 TIMES DAILY AS NEEDED FOR ANXIETY. AVOID REGULAR USE  . LORazepam  (ATIVAN) 0.5 MG tablet Take 0.5 mg by mouth at bedtime.  . magnesium hydroxide (MILK OF MAGNESIA) 400 MG/5ML suspension Take 15 mLs by mouth daily as needed for mild constipation.  . RESTASIS 0.05 % ophthalmic emulsion Place 1 drop into both eyes 2 (two) times daily.     Allergies:   Alendronate sodium, Penicillins, Amoxicillin, and Metronidazole   Social History   Tobacco Use  . Smoking status: Never Smoker  . Smokeless tobacco: Never Used  Substance Use Topics  . Alcohol use: Yes    Alcohol/week: 0.0 standard drinks    Comment: occ  . Drug use: No     Family Hx: The patient's family history includes Bladder Cancer in her brother; Other in her mother and sister. There is no history of Colon cancer, Esophageal cancer, Stomach cancer, Rectal cancer, or Liver cancer.  Review of Systems  Constitution: Negative for chills and fever.  Gastrointestinal: Negative for diarrhea, hematochezia, melena and vomiting.  Genitourinary: Positive for frequency and incomplete emptying.     EKGs/Labs/Other Test Reviewed:    EKG:  EKG is   ordered today.  The ekg ordered today demonstrates normal sinus rhythm, HR 74, normal axis, PVC, QTc 432, no changes.   Recent Labs: 04/07/2019: TSH 2.39 06/17/2019: Magnesium 1.6 06/18/2019: ALT 16 06/19/2019: BUN 7; Creatinine, Ser 0.86; Hemoglobin 12.1; Platelets 217; Potassium 3.8; Sodium 137   Recent Lipid Panel Lab Results  Component Value Date/Time   CHOL 202 (H) 04/07/2019 03:52 PM   TRIG 203.0 (H) 04/07/2019 03:52 PM   HDL 67.30 04/07/2019 03:52 PM   CHOLHDL 3 04/07/2019 03:52 PM   LDLCALC 105 (H) 04/08/2018 10:19 AM   LDLDIRECT 100.0 04/07/2019 03:52 PM    Physical Exam:    VS:  BP 128/74   Pulse 74   Ht 5' (1.524 m)   Wt 161 lb 6.4 oz (73.2 kg)   SpO2 97%   BMI 31.52 kg/m     Wt Readings from Last 3 Encounters:  07/14/19 161 lb 6.4 oz (73.2 kg)  06/28/19 161 lb (73 kg)  06/19/19 161 lb 3.2 oz (73.1 kg)     Constitutional:       Appearance: Healthy appearance. Not in distress.  Neck:     Thyroid: No thyromegaly.     Vascular: JVD normal.  Pulmonary:     Breath sounds: No wheezing. No rales.  Cardiovascular:     Normal rate. Regular rhythm. Normal S1. Normal S2.     Murmurs: There is no murmur.  Edema:    Peripheral edema absent.  Abdominal:     Tenderness: There is abdominal tenderness (RUQ, LLQ).  Skin:    General: Skin is warm and dry.  Neurological:     General: No focal deficit present.     Mental  Status: Alert and oriented to person, place and time.     Cranial Nerves: Cranial nerves are intact.       ASSESSMENT & PLAN:    1. Syncope, unspecified syncope type Her episodes of syncope sound c/w vasovagal syncope.  Her EF is normal by echocardiogram.  Her head CT was ok.  There were no pauses noted on tele.  But, she did have episodes of atrial fibrillation noted.  Her rate was controlled.  Given her advanced age, she is at risk for conduction system disease.  I think it is important that we rule out post termination pauses when she converts from atrial fibrillation to normal sinus rhythm.  I have asked her to avoid driving for now until we get the monitor back.  FU with Dr. Angelena Form in 6-8 weeks.   2. Paroxysmal atrial fibrillation (HCC) She is in normal sinus rhythm today.  She had atrial fibrillation noted by EMS and x 1 in the hospital.  She has not had any GI bleeding.  Continue Apixaban, Diltiazem.   3. Mild mitral regurgitation Trivial by recent echocardiogram.    4. Chest pain She has had some chest pain that is atypica for ischemia.  Her ECG does not show ST changes and her echocardiogram in the hospital showed normal LVF.  I suspect her chest pain is related to her abdominal issues.  I suspect she has diverticulitis.  She sees GI tomorrow.  I have asked her to call us if her chest pain does not resolve with resolution of her abdominal pain.  At that point, I will have her undergo a Myoview prior  to follow up.     Dispo:  Return in about 8 weeks (around 09/08/2019) for Follow up after testing w/ Dr. Angelena Form, in person.   Medication Adjustments/Labs and Tests Ordered: Current medicines are reviewed at length with the patient today.  Concerns regarding medicines are outlined above.  Tests Ordered: Orders Placed This Encounter  Procedures  . CARDIAC EVENT MONITOR  . EKG 12-Lead   Medication Changes: No orders of the defined types were placed in this encounter.   Signed, Richardson Dopp, PA-C  07/14/2019 4:55 PM    [facility] [address], [place], [place]  [address] Phone: [phone]; Fax: [phone]

## 2019-07-14 NOTE — Patient Instructions (Addendum)
Medication Instructions:   Your physician recommends that you continue on your current medications as directed. Please refer to the Current Medication list given to you today.  *If you need a refill on your cardiac medications before your next appointment, please call your pharmacy*  Lab Work:  None ordered today  Testing/Procedures:  [employer] Cardiac Event Monitor Instructions Your physician has requested you wear your cardiac event monitor for 30 days, (1-30). [employer] may call or text to confirm a shipping address. The monitor will be sent to a land address via UPS. [employer] will not ship a monitor to a PO BOX. It typically takes 3-5 days to receive your monitor after it has been enrolled. [employer] will assist with USPS tracking if your package is delayed. The telephone number for [employer] is [phone]. Once you have received your monitor, please review the enclosed instructions. Instruction tutorials can also be viewed under help and settings on the enclosed cell phone. Your monitor has already been registered assigning a specific monitor serial # to you.  Applying the monitor Remove cell phone from case and turn it on. The cell phone works as Dealer and needs to be within [employer] of you at all times. The cell phone will need to be charged on a daily basis. We recommend you plug the cell phone into the enclosed charger at your bedside table every night.  Monitor batteries: You will receive two monitor batteries labelled #1 and #2. These are your recorders. Plug battery #2 onto the second connection on the enclosed charger. Keep one battery on the charger at all times. This will keep the monitor battery deactivated. It will also keep it fully charged for when you need to switch your monitor batteries. A small light will be blinking on the battery emblem when it is charging. The light on the battery emblem will remain on when the battery is fully  charged.  Open package of a Monitor strip. Insert battery #1 into black hood on strip and gently squeeze monitor battery onto connection as indicated in instruction booklet. Set aside while preparing skin.  Choose location for your strip, vertical or horizontal, as indicated in the instruction booklet. Shave to remove all hair from location. There cannot be any lotions, oils, powders, or colognes on skin where monitor is to be applied. Wipe skin clean with enclosed Saline wipe. Dry skin completely.  Peel paper labeled #1 off the back of the Monitor strip exposing the adhesive. Place the monitor on the chest in the vertical or horizontal position shown in the instruction booklet. One arrow on the monitor strip must be pointing upward. Carefully remove paper labeled #2, attaching remainder of strip to your skin. Try not to create any folds or wrinkles in the strip as you apply it.  Firmly press and release the circle in the center of the monitor battery. You will hear a small beep. This is turning the monitor battery on. The heart emblem on the monitor battery will light up every 5 seconds if the monitor battery in turned on and connected to the patient securely. Do not push and hold the circle down as this turns the monitor battery off. The cell phone will locate the monitor battery. A screen will appear on the cell phone checking the connection of your monitor strip. This may read poor connection initially but change to good connection within the next minute. Once your monitor accepts the connection you will hear a series of 3 beeps followed by  a climbing crescendo of beeps. A screen will appear on the cell phone showing the two monitor strip placement options. Touch the picture that demonstrates where you applied the monitor strip.  Your monitor strip and battery are waterproof. You are able to shower, bathe, or swim with the monitor on. They just ask you do not submerge deeper than 3 feet  underwater. We recommend removing the monitor if you are swimming in a lake, river, or ocean.  Your monitor battery will need to be switched to a fully charged monitor battery approximately once a week. The cell phone will alert you of an action which needs to be made.  On the cell phone, tap for details to reveal connection status, monitor battery status, and cell phone battery status. The green dots indicates your monitor is in good status. A red dot indicates there is something that needs your attention.  To record a symptom, click the circle on the monitor battery. In 30-60 seconds a list of symptoms will appear on the cell phone. Select your symptom and tap save. Your monitor will record a sustained or significant arrhythmia regardless of you clicking the button. Some patients do not feel the heart rhythm irregularities. [employer] will notify us of any serious or critical events.  Refer to instruction booklet for instructions on switching batteries, changing strips, the Do not disturb or Pause features, or any additional questions.  Call [employer] at [phone], to confirm your monitor is transmitting and record your baseline. They will answer any questions you may have regarding the monitor instructions at that time.  Returning the monitor to [place] all equipment back into blue box. Peel off strip of paper to expose adhesive and close box securely. There is a prepaid UPS shipping label on this box. Drop in a UPS drop box, or at a UPS facility like [employer]. You may also contact [employer] to arrange UPS to pick up monitor package at your home.   Follow-Up: At [facility], you and your health needs are our priority.  As part of our continuing mission to provide you with exceptional heart care, we have created designated Provider Care Teams.  These Care Teams include your primary Cardiologist (physician) and Advanced Practice Providers (APPs -  Physician Assistants  and Nurse Practitioners) who all work together to provide you with the care you need, when you need it.  We recommend signing up for the patient portal called "MyChart".  Sign up information is provided on this After Visit Summary.  MyChart is used to connect with patients for Virtual Visits (Telemedicine).  Patients are able to view lab/test results, encounter notes, upcoming appointments, etc.  Non-urgent messages can be sent to your provider as well.   To learn more about what you can do with MyChart, go to [email or web address].    Your next appointment:    On 09/06/19 at 3:00PM with Lauree Chandler, MD

## 2019-07-15 ENCOUNTER — Ambulatory Visit: Payer: Medicare Other | Admitting: Internal Medicine

## 2019-07-15 ENCOUNTER — Encounter: Payer: Self-pay | Admitting: Internal Medicine

## 2019-07-15 VITALS — BP 132/70 | HR 72 | Temp 98.5°F | Ht 59.0 in | Wt 162.2 lb

## 2019-07-15 DIAGNOSIS — K5909 Other constipation: Secondary | ICD-10-CM | POA: Diagnosis not present

## 2019-07-15 DIAGNOSIS — K581 Irritable bowel syndrome with constipation: Secondary | ICD-10-CM | POA: Diagnosis not present

## 2019-07-15 DIAGNOSIS — R1032 Left lower quadrant pain: Secondary | ICD-10-CM

## 2019-07-15 DIAGNOSIS — R39198 Other difficulties with micturition: Secondary | ICD-10-CM

## 2019-07-15 DIAGNOSIS — G8929 Other chronic pain: Secondary | ICD-10-CM

## 2019-07-15 MED ORDER — DOXYCYCLINE MONOHYDRATE 100 MG PO TABS: 100.0000 mg | ORAL_TABLET | Freq: Two times a day (BID) | ORAL | 0 refills | Status: DC

## 2019-07-15 NOTE — Progress Notes (Signed)
Ashley Lawson 86 y.o. [birth year] [id number]  Assessment & Plan:   Encounter Diagnoses  Name Primary?  . Chronic constipation Yes  . Irritable bowel syndrome with constipation   . Chronic LLQ pain + tenderness   . Difficulty in urination    Unfortunately these problems are chronic, I am not sure what triggered the intense increase in pain in the syncope but it certainly sounds like she did have a vasovagal reaction.  She has not had findings of diverticulitis but sometimes responds to antibiotics.  I think she has pelvic floor issues she is status post gynecologic and bladder surgery before, and perhaps the pain she has could be related to that some.  Some of her pain is in the groin and that is clearly not gastrointestinal in origin.  Maybe this is some type of neuropathic pain as well.  I told her it was okay to use a tablespoon of milk of magnesia daily.  I am going to give her doxycycline 100 mg twice daily for 7 days like I did last year and she thought helped.  This is empiric but I think reasonable given the amount of suffering she has.  I am going to refer her for pelvic floor physical therapy to see if that makes a difference in alleviating some of these symptoms.  I will see her back as needed after that is completed.  I agree with her to not take her Flomax during the day and it may be tricky very to even take that at night since it is an alpha-blocker and she is 86 years old.  I wonder if she could have interstitial cystitis - has seen [facility] for some time and would think that had been looked for?    CC: Panosh, Standley Brooking, MD   Subjective:   Chief Complaint: Left lower quadrant pain  HPI Ashley Lawson is here for follow-up after having a rough time with an admission to the hospital after she had severe left lower quadrant pain and actually had syncope.  She was found to have A. fib and was placed on anticoagulation.  She has had chronic left lower quadrant pain and irritable  bowel syndrome with constipation anal sphincter spasm and a lot of problems that have been difficult to control.  I had seen her about a year ago and even though we had not had actual proof of diverticulitis she was treated with doxycycline empirically and thought that that has helped her.  She also has a lot of difficulty with urination.  She has been placed on Flomax and is supposed to take it twice a day but she says it makes her swimmy headed if she takes it in the morning so she only takes it at night.  A lot of difficulty with effective defecation and urination and what sounds like muscle weakness type problems.  Moving back to the recent hospitalization she was thought to have had a vasovagal syncope related to pain.  C. difficile and GI pathogen panel were negative she had taken extra laxatives that day due to her chronic constipation with IBS and had some diarrhea.  A CT scan demonstrated some possible inflammatory changes in the rectum and distal sigmoid.  She was treated with Cipro and metronidazole for about a total of 5 days.  She continues to unfortunately have a lot of left lower quadrant pain and struggles with defecation and urination as above.  These are not new issues.  She has moved around with  different laxatives to include MiraLAX and prune juice prunes etc. and now has settled in on milk of magnesia and wants to know if she can take that regularly.  She thinks MiraLAX moved her "too much".  She has not had pelvic floor physical therapy that I can tell. Allergies  Allergen Reactions  . Alendronate Sodium     upset stomach  . Penicillins     Has patient had a PCN reaction causing immediate rash, facial/tongue/throat swelling, SOB or lightheadedness with hypotension: Yes Has patient had a PCN reaction causing severe rash involving mucus membranes or skin necrosis: No Has patient had a PCN reaction that required hospitalization: No Has patient had a PCN reaction occurring within the  last 10 years: No If all of the above answers are "NO", then may proceed with Cephalosporin use.   Marland Kitchen Amoxicillin Rash    Has patient had a PCN reaction causing immediate rash, facial/tongue/throat swelling, SOB or lightheadedness with hypotension: yes Has patient had a PCN reaction causing severe rash involving mucus membranes or skin necrosis: no Has patient had a PCN reaction that required hospitalization: no Has patient had PCN reaction within the last 10 years: no  If all of the above answers are "NO", then may proceed with Cephalosporin use.  REACTION: unspecified  . Metronidazole Other (See Comments)    Pounding headaches patient says was bad.  With cipro for diverticulitis rx. Other reaction(s): Other (See Comments) Pounding headaches patient says was bad.With cipro for diverticulitis rx.   Current Meds  Medication Sig  . acetaminophen (TYLENOL) 500 MG tablet Take 500 mg by mouth every 6 (six) hours as needed for mild pain, moderate pain or headache.  . ALPRAZolam (XANAX) 0.5 MG tablet Take 0.5 mg by mouth at bedtime as needed for anxiety.  . AMBULATORY NON FORMULARY MEDICATION Medication Name: Diltiazem 2% with lidocaine 5% Insert a pea size amount into rectum to the first knuckle TID  . Ascorbic Acid (VITAMIN C) 100 MG tablet Take 100 mg by mouth daily.    . calcium carbonate (OS-CAL) 1250 (500 Ca) MG chewable tablet Chew 1 tablet by mouth 2 (two) times daily.  Marland Kitchen conjugated estrogens (PREMARIN) vaginal cream Place 1 Applicatorful vaginally daily as needed (dryness).  Marland Kitchen diltiazem (CARDIZEM CD) 120 MG 24 hr capsule TAKE 1 CAPSULE BY MOUTH EVERY DAY  . ELIQUIS 5 MG TABS tablet TAKE 1 TABLET BY MOUTH TWICE A DAY  . fluticasone (FLONASE) 50 MCG/ACT nasal spray Place 2 sprays into both nostrils daily.  Marland Kitchen guaiFENesin (MUCINEX) 600 MG 12 hr tablet Take 600 mg by mouth 2 (two) times daily as needed (allergies).  . loratadine (CLARITIN) 10 MG tablet Take 10 mg by mouth daily as needed  for allergies.  Marland Kitchen LORazepam (ATIVAN) 0.5 MG tablet TAKE 1-2 TABLETS BY MOUTH 2 TIMES DAILY AS NEEDED FOR ANXIETY. AVOID REGULAR USE  . magnesium hydroxide (MILK OF MAGNESIA) 400 MG/5ML suspension Take 15 mLs by mouth daily as needed for mild constipation.  . Multiple Vitamin (MULTIVITAMIN) tablet Take 1 tablet by mouth daily.  . Probiotic Product (ALIGN) 4 MG CAPS Take 1 capsule by mouth daily.  . RESTASIS 0.05 % ophthalmic emulsion Place 1 drop into both eyes 2 (two) times daily.  . tamsulosin (FLOMAX) 0.4 MG CAPS capsule Take 0.4 mg by mouth daily.   Past Medical History:  Diagnosis Date  . Abdominal pain 12/2009   hospitalized  . Allergic rhinitis   . Chronic constipation   . Compression  fx, lumbar spine ([place])   . DDD (degenerative disc disease), lumbar   . Diverticulosis of colon   . Dog bite(E906.0) 08/30/2011  . GERD (gastroesophageal reflux disease)   . Hepatic steatosis   . Hyperlipidemia   . Hyperplastic colon polyp   . IBS (irritable bowel syndrome)    constipation predominant  . Internal hemorrhoids   . Osteoporosis    dexa 2011 -2.7 hip nl spine  . PAF (paroxysmal atrial fibrillation) ([place])    a. dx 05/2017.  Marland Kitchen PMR (polymyalgia rheumatica) ([facility]) 08/30/2011   under rheum care  on low dose pred 5 mg   . Renal disorder    Past Surgical History:  Procedure Laterality Date  . ABDOMINAL HYSTERECTOMY     with bladder tact and rectocele repair  . CATARACT EXTRACTION  2010   both  . COLONOSCOPY  2016 was most recent   Social History   Social History Narrative   Retired   Regular exercise- yes   Widowed   At home with children and GKs    HH of 2     Puppy   Plays cards is social and active    family history includes Bladder Cancer in her brother; Other in her mother and sister.   Review of Systems As above  Objective:   Physical Exam BP 132/70 (BP Location: Left Arm, Patient Position: Sitting, Cuff Size: Normal)   Pulse 72   Temp 98.5 F (36.9 C)   Ht 4'  11" (1.499 m)   Wt 162 lb 4 oz (73.6 kg)   BMI 32.77 kg/m  NAD  LLQ tender +/- worse w/ MM tension L groin also abd benign overall  Glora Myarati RMA present  Rectal - tags/ext hems  No mass, mildly tender anorectum w/ some spasm  Ansocopy no proctitis, tender w/ inertion, no fissure

## 2019-07-15 NOTE — Patient Instructions (Addendum)
If you are age 84 or older, your body mass index should be between 23-30. Your Body mass index is 32.77 kg/m. If this is out of the aforementioned range listed, please consider follow up with your Primary Care Provider.  If you are age 92 or younger, your body mass index should be between 19-25. Your Body mass index is 32.77 kg/m. If this is out of the aformentioned range listed, please consider follow up with your Primary Care Provider.   You are being referred to Pelvic Floor Physical Therapy.  They will contact you with an appointment.  We have sent the following medications to your pharmacy for you to pick up at your convenience: Doxycycline  Okay to use Milk of Magnesia daily.  Follow up as needed.  Thank you for choosing me and [facility].   Silvano Rusk, MD

## 2019-07-19 ENCOUNTER — Encounter: Payer: Self-pay | Admitting: Cardiovascular Disease

## 2019-07-19 ENCOUNTER — Ambulatory Visit (INDEPENDENT_AMBULATORY_CARE_PROVIDER_SITE_OTHER): Payer: Medicare Other

## 2019-07-19 ENCOUNTER — Telehealth: Payer: Self-pay | Admitting: Physician Assistant

## 2019-07-19 DIAGNOSIS — R55 Syncope and collapse: Secondary | ICD-10-CM

## 2019-07-19 NOTE — Telephone Encounter (Signed)
Paged by [employer] [phone]) regarding a episode of rate controlled aflutter with HR of 80s. Patient is anticoagulated and rate controlled. Will forward to ordering provider as FYI.

## 2019-07-20 NOTE — Telephone Encounter (Signed)
Strips received in regards to this auto-triggered event from [employer] on this pt.  Pt was in rate controlled atrial flutter at 80 bpm.  This occurred on 4/19 at at 3:47 pm CST.  Will show to DOD Dr. Marlou Porch to sign.  Will scan thereafter.  On-call notified ordering Provider Richardson Dopp PA-C.  No changes made by On-call, for rate is controlled and pt is already anticoagulated.  Will continue to monitor. ]  Strips showed to DOD Dr. Marlou Porch and signed. Will place in Med Records box to scan.  No changes to be made at this time, continue to monitor.

## 2019-07-20 NOTE — Telephone Encounter (Signed)
NotedRichardson Dopp, PA-C    07/20/2019 12:19 PM

## 2019-07-21 ENCOUNTER — Ambulatory Visit: Payer: Medicare Other | Admitting: Internal Medicine

## 2019-07-23 ENCOUNTER — Telehealth: Payer: Self-pay | Admitting: Internal Medicine

## 2019-07-23 NOTE — Telephone Encounter (Signed)
Patient reports that this am she had a BM that was only blood.  She has not had any more episodes of bleeding.  She denies any CP, SOB, light headed or dizzy.  She is asked to report to the ED if she has any more bleeding.  She verbalized understanding.  She asked about an MRI as well. No mention of plans for MRI in the office note from 07/15/19.  She will call back for any additional questions or concerns.

## 2019-07-23 NOTE — Telephone Encounter (Signed)
Patient called states she was recommended to take milk of magnesia and she woke up thinking she had a BM but it was blood and then she did go to the bathroom and had a BM. She also mentioned something about getting an MRI done.

## 2019-07-26 ENCOUNTER — Telehealth: Payer: Self-pay | Admitting: Internal Medicine

## 2019-07-26 NOTE — Telephone Encounter (Signed)
Patient believes she has diverticulitis again. I reviewed with her in detail Dr. Celesta Aver note from 4/15.  I explained to her that he did not believe that her symptoms were from diverticulitis.  No change with the empiric tx of the doxycycline.  She is encouraged to continue the MOM.  She has a PT appt on 5/12.  She will call back for any additional questions or concerns.

## 2019-07-30 ENCOUNTER — Other Ambulatory Visit: Payer: Self-pay | Admitting: Internal Medicine

## 2019-07-30 NOTE — Telephone Encounter (Signed)
Last OV 06/28/2019  Last filled 06/28/2019, # 24 with 0 refills

## 2019-07-30 NOTE — Telephone Encounter (Signed)
Alprazolam is on the med list  Please confirm that she is not taking this  And take off list if so  So then I can refill the lorazepam.

## 2019-08-02 ENCOUNTER — Emergency Department (HOSPITAL_COMMUNITY)
Admission: EM | Admit: 2019-08-02 | Discharge: 2019-08-03 | Disposition: A | Discharge status: Home / Self Care | Payer: Medicare Other | Attending: Emergency Medicine | Admitting: Emergency Medicine

## 2019-08-02 ENCOUNTER — Telehealth: Payer: Self-pay | Admitting: Internal Medicine

## 2019-08-02 ENCOUNTER — Other Ambulatory Visit: Payer: Self-pay

## 2019-08-02 DIAGNOSIS — R1032 Left lower quadrant pain: Secondary | ICD-10-CM | POA: Insufficient documentation

## 2019-08-02 DIAGNOSIS — Z79899 Other long term (current) drug therapy: Secondary | ICD-10-CM | POA: Diagnosis not present

## 2019-08-02 DIAGNOSIS — Z7901 Long term (current) use of anticoagulants: Secondary | ICD-10-CM | POA: Insufficient documentation

## 2019-08-02 LAB — URINALYSIS, ROUTINE W REFLEX MICROSCOPIC
Bacteria, UA: NONE SEEN
Bilirubin Urine: NEGATIVE
Glucose, UA: NEGATIVE mg/dL
Ketones, ur: NEGATIVE mg/dL
Leukocytes,Ua: NEGATIVE
Nitrite: NEGATIVE
Protein, ur: NEGATIVE mg/dL
Specific Gravity, Urine: 1.003 — ABNORMAL LOW (ref 1.005–1.030)
pH: 9 — ABNORMAL HIGH (ref 5.0–8.0)

## 2019-08-02 LAB — CBC
HCT: 39.3 % (ref 36.0–46.0)
Hemoglobin: 13.5 g/dL (ref 12.0–15.0)
MCH: 32.6 pg (ref 26.0–34.0)
MCHC: 34.4 g/dL (ref 30.0–36.0)
MCV: 94.9 fL (ref 80.0–100.0)
Platelets: 286 10*3/uL (ref 150–400)
RBC: 4.14 MIL/uL (ref 3.87–5.11)
RDW: 12.2 % (ref 11.5–15.5)
WBC: 7 10*3/uL (ref 4.0–10.5)
nRBC: 0 % (ref 0.0–0.2)

## 2019-08-02 LAB — COMPREHENSIVE METABOLIC PANEL
ALT: 22 U/L (ref 0–44)
AST: 28 U/L (ref 15–41)
Albumin: 4 g/dL (ref 3.5–5.0)
Alkaline Phosphatase: 61 U/L (ref 38–126)
Anion gap: 10 (ref 5–15)
BUN: 7 mg/dL — ABNORMAL LOW (ref 8–23)
CO2: 23 mmol/L (ref 22–32)
Calcium: 9.4 mg/dL (ref 8.9–10.3)
Chloride: 98 mmol/L (ref 98–111)
Creatinine, Ser: 0.81 mg/dL (ref 0.44–1.00)
GFR calc Af Amer: >60 mL/min (ref >60)
GFR calc non Af Amer: >60 mL/min (ref >60)
Glucose, Bld: 96 mg/dL (ref 70–99)
Potassium: 4.2 mmol/L (ref 3.5–5.1)
Sodium: 131 mmol/L — ABNORMAL LOW (ref 135–145)
Total Bilirubin: 0.5 mg/dL (ref 0.3–1.2)
Total Protein: 6.8 g/dL (ref 6.5–8.1)

## 2019-08-02 LAB — LIPASE, BLOOD: Lipase: 30 U/L (ref 11–51)

## 2019-08-02 MED ORDER — SODIUM CHLORIDE 0.9% FLUSH: 3.0000 mL | Freq: Once | INTRAVENOUS | Status: DC

## 2019-08-02 NOTE — Telephone Encounter (Signed)
Called patient and she stated that she is not taking the Xanax. I have removed this medication from her list.

## 2019-08-02 NOTE — ED Triage Notes (Signed)
Pt arrives to ED w/ c/o abdominal pain. Pt says recently diagnosed w/ diverticulitis. Pt's pain much worse today. Pt endorses n/v. Pt rates pain 8/10 pain.

## 2019-08-02 NOTE — Telephone Encounter (Signed)
Pt reported that she is experiencing LUQ pain.  She requested a refill for doxycycline.

## 2019-08-02 NOTE — Telephone Encounter (Signed)
Patient has been scheduled for PT next week.  She is asking for a refill of the doxycycline until her PT appts.  Pain has returned in her stomach and LLQ.  Please advise. She is aware that you are out of the office until tomorrow.

## 2019-08-03 ENCOUNTER — Emergency Department (HOSPITAL_COMMUNITY): Payer: Medicare Other

## 2019-08-03 ENCOUNTER — Encounter (HOSPITAL_COMMUNITY): Payer: Self-pay | Admitting: Student

## 2019-08-03 MED ORDER — IOHEXOL 300 MG/ML  SOLN
100.0000 mL | Freq: Once | INTRAMUSCULAR | Status: AC | PRN
  Administered 2019-08-03: 100 mL via INTRAVENOUS

## 2019-08-03 MED ORDER — IOHEXOL 9 MG/ML PO SOLN
500.0000 mL | ORAL | Status: AC
  Administered 2019-08-03: 06:00:00 500 mL via ORAL

## 2019-08-03 MED ORDER — IOHEXOL 9 MG/ML PO SOLN
ORAL | Status: AC
  Filled 2019-08-03: qty 1000

## 2019-08-03 NOTE — ED Notes (Signed)
Pt transported to CT ?

## 2019-08-03 NOTE — ED Provider Notes (Signed)
TIME SEEN: 3:33 AM  CHIEF COMPLAINT: Abdominal pain  HPI: Patient is an 86 year old female with history of chronic constipation, IBS, paroxysmal A. fib on Eliquis, PMR who presents to the emergency department with complaints of abdominal pain.    States she had diverticulitis in April and was admitted.  On review of records, it appears she had CTAP that showed proctocolitis and diverticulosis without diverticulitis on 06/17/19 and was admitted to the hospital at that time.  No admissions in April.  States she was doing well until about a month ago and started having pain again.  Called her GI doctor and was placed on doxycycline.  Her GI doctor is Dr. Carlean Purl.  She finished this over a week ago but called back again yesterday to try to get placed on doxycycline again.  She came to the emergency department because pain has been worsening over the past day.  Mostly in the left mid and lower quadrant.  No fever.  No nausea or vomiting.  No diarrhea.  No blood in stool or melena. No dysuria, hematuria, vaginal bleeding or discharge.   Previous Abd surgery - hysterectomy, bladder tacking.  ROS: See HPI; limited patient is a poor historian Constitutional: no fever  Eyes: no drainage  ENT: no runny nose   Cardiovascular:  no chest pain  Resp: no SOB  GI: no vomiting GU: no dysuria Integumentary: no rash  Allergy: no hives  Musculoskeletal: no leg swelling  Neurological: no slurred speech ROS otherwise negative  PAST MEDICAL HISTORY/PAST SURGICAL HISTORY:  Past Medical History:  Diagnosis Date  . Abdominal pain 12/2009   hospitalized  . Allergic rhinitis   . Chronic constipation   . Compression fx, lumbar spine ([place])   . DDD (degenerative disc disease), lumbar   . Diverticulosis of colon   . Dog bite(E906.0) 08/30/2011  . GERD (gastroesophageal reflux disease)   . Hepatic steatosis   . Hyperlipidemia   . Hyperplastic colon polyp   . IBS (irritable bowel syndrome)    constipation  predominant  . Internal hemorrhoids   . Osteoporosis    dexa 2011 -2.7 hip nl spine  . PAF (paroxysmal atrial fibrillation) ([place])    a. dx 05/2017.  Marland Kitchen PMR (polymyalgia rheumatica) (HCC) 08/30/2011   under rheum care  on low dose pred 5 mg   . Renal disorder     MEDICATIONS:  Prior to Admission medications   Medication Sig Start Date End Date Taking? Authorizing Provider  acetaminophen (TYLENOL) 500 MG tablet Take 500 mg by mouth every 6 (six) hours as needed for mild pain, moderate pain or headache.    [provider]  AMBULATORY NON FORMULARY MEDICATION Medication Name: Diltiazem 2% with lidocaine 5% Insert a pea size amount into rectum to the first knuckle TID 06/12/17   Gatha Mayer, MD  Ascorbic Acid (VITAMIN C) 100 MG tablet Take 100 mg by mouth daily.      [provider]  calcium carbonate (OS-CAL) 1250 (500 Ca) MG chewable tablet Chew 1 tablet by mouth 2 (two) times daily.    [provider]  conjugated estrogens (PREMARIN) vaginal cream Place 1 Applicatorful vaginally daily as needed (dryness).    [provider]  diltiazem (CARDIZEM CD) 120 MG 24 hr capsule TAKE 1 CAPSULE BY MOUTH EVERY DAY 12/21/18   Rosita Fire, Brittainy M, PA-C  ELIQUIS 5 MG TABS tablet TAKE 1 TABLET BY MOUTH TWICE A DAY 05/28/19   Burnell Blanks, MD  fluticasone (FLONASE) 50  MCG/ACT nasal spray Place 2 sprays into both nostrils daily.    [provider]  guaiFENesin (MUCINEX) 600 MG 12 hr tablet Take 600 mg by mouth 2 (two) times daily as needed (allergies).    [provider]  loratadine (CLARITIN) 10 MG tablet Take 10 mg by mouth daily as needed for allergies.    [provider]  LORazepam (ATIVAN) 0.5 MG tablet TAKE 1-2 TABLETS BY MOUTH 2 TIMES DAILY AS NEEDED FOR ANXIETY. AVOID REGULAR USE 08/02/19   Panosh, Standley Brooking, MD  magnesium hydroxide (MILK OF MAGNESIA) 400 MG/5ML suspension Take 15 mLs by mouth daily as needed for mild constipation.     [provider]  Multiple Vitamin (MULTIVITAMIN) tablet Take 1 tablet by mouth daily.    [provider]  Probiotic Product (ALIGN) 4 MG CAPS Take 1 capsule by mouth daily.    [provider]  RESTASIS 0.05 % ophthalmic emulsion Place 1 drop into both eyes 2 (two) times daily. 10/26/14   [provider]  tamsulosin (FLOMAX) 0.4 MG CAPS capsule Take 0.4 mg by mouth daily.    [provider]    ALLERGIES:  Allergies  Allergen Reactions  . Alendronate Sodium     upset stomach  . Penicillins     Has patient had a PCN reaction causing immediate rash, facial/tongue/throat swelling, SOB or lightheadedness with hypotension: Yes Has patient had a PCN reaction causing severe rash involving mucus membranes or skin necrosis: No Has patient had a PCN reaction that required hospitalization: No Has patient had a PCN reaction occurring within the last 10 years: No If all of the above answers are "NO", then may proceed with Cephalosporin use.   Marland Kitchen Amoxicillin Rash    Has patient had a PCN reaction causing immediate rash, facial/tongue/throat swelling, SOB or lightheadedness with hypotension: yes Has patient had a PCN reaction causing severe rash involving mucus membranes or skin necrosis: no Has patient had a PCN reaction that required hospitalization: no Has patient had PCN reaction within the last 10 years: no  If all of the above answers are "NO", then may proceed with Cephalosporin use.  REACTION: unspecified  . Metronidazole Other (See Comments)    Pounding headaches patient says was bad.  With cipro for diverticulitis rx. Other reaction(s): Other (See Comments) Pounding headaches patient says was bad.With cipro for diverticulitis rx.    SOCIAL HISTORY:  Social History   Tobacco Use  . Smoking status: Never Smoker  . Smokeless tobacco: Never Used  Substance Use Topics  . Alcohol use: Yes    Alcohol/week: 0.0 standard drinks    Comment: occ     FAMILY HISTORY: Family History  Problem Relation Age of Onset  . Other Mother        fx hip  . Other Sister        fx hip  . Bladder Cancer Brother   . Colon cancer Neg Hx   . Esophageal cancer Neg Hx   . Stomach cancer Neg Hx   . Rectal cancer Neg Hx   . Liver cancer Neg Hx     EXAM: BP (!) 152/76 (BP Location: Left Arm)   Pulse 83   Temp 98.2 F (36.8 C) (Oral)   Resp 18   SpO2 99%  CONSTITUTIONAL: Alert and oriented and responds appropriately to questions. Well-appearing; well-nourished, elderly, no distress HEAD: Normocephalic EYES: Conjunctivae clear, pupils appear equal, EOM appear intact ENT: normal nose; moist mucous membranes NECK: Supple, normal  ROM CARD: RRR; S1 and S2 appreciated; no murmurs, no clicks, no rubs, no gallops RESP: Normal chest excursion without splinting or tachypnea; breath sounds clear and equal bilaterally; no wheezes, no rhonchi, no rales, no hypoxia or respiratory distress, speaking full sentences ABD/GI: Normal bowel sounds; non-distended; soft, tender in the left lower quadrant and left midabdomen without guarding or rebound, some tenderness at McBurney's point BACK:  The back appears normal EXT: Normal ROM in all joints; no deformity noted, no edema; no cyanosis SKIN: Normal color for age and race; warm; no rash on exposed skin NEURO: Moves all extremities equally PSYCH: The patient's mood and manner are appropriate.   MEDICAL DECISION MAKING: Patient here with abdominal pain.  She is concerned that she has diverticulitis today.  Her last CT scan in March showed proctocolitis and diverticulosis without diverticulitis.  Her labs have been reviewed/interpreted and show no acute abnormality.  No leukocytosis.  Normal creatinine, LFTs and lipase.  Urine reviewed/interpreted and shows no sign of infection or dehydration.  She declines any pain medicine at this time.  We will proceed with CT of the abdomen pelvis.  Differential includes  diverticulitis, colitis, bowel obstruction, appendicitis, kidney stone.  ED PROGRESS: 7:00 AM  Pt's CTAP pending.  Signed out to Dr. Alvino Chapel.    I reviewed all nursing notes and pertinent previous records as available.  I have reviewed and interpreted any EKGs, lab and urine results, imaging (as available).    EKG Interpretation  Date/Time:  Monday Aug 02 2019 20:15:09 EDT Ventricular Rate:  86 PR Interval:  170 QRS Duration: 78 QT Interval:  384 QTC Calculation: 459 R Axis:   -20 Text Interpretation: Normal sinus rhythm Normal ECG Artifact Confirmed by Pryor Curia [phone]) on 08/03/2019 3:34:03 AM          Ashley Lawson was evaluated in Emergency Department on 08/03/2019 for the symptoms described in the history of present illness. She was evaluated in the context of the global COVID-19 pandemic, which necessitated consideration that the patient might be at risk for infection with the SARS-CoV-2 virus that causes COVID-19. Institutional protocols and algorithms that pertain to the evaluation of patients at risk for COVID-19 are in a state of rapid change based on information released by regulatory bodies including the CDC and federal and state organizations. These policies and algorithms were followed during the patient's care in the ED.      Porter Moes, Delice Bison, DO 08/03/19 [phone]

## 2019-08-03 NOTE — ED Provider Notes (Signed)
  Physical Exam  BP (!) 152/77   Pulse 65   Temp 98.2 F (36.8 C) (Oral)   Resp 18   SpO2 99%   Physical Exam  ED Course/Procedures     Procedures  MDM  Received patient signout.  Abdominal pain.  Has had previous proctocolitis.  CT scan reassuring.  Does have degenerative disc disease.  Follow-up with PCP and GI as needed.  Discharge home.  Patient is eager to go home.       Davonna Belling, MD 08/03/19 [phone]

## 2019-08-03 NOTE — ED Notes (Signed)
Patient verbalizes understanding of discharge instructions. Opportunity for questioning and answers were provided. Armband removed by staff, pt discharged from ED.  

## 2019-08-03 NOTE — Telephone Encounter (Signed)
She went to ED and had a negative CT scan  No Rx

## 2019-08-03 NOTE — ED Notes (Signed)
This RN attempted IV access twice unsuccessfully, Charge RN Tori at bedside to attempt

## 2019-08-03 NOTE — ED Notes (Signed)
Attempted IV X2, unsuccessful

## 2019-08-05 ENCOUNTER — Encounter: Payer: Self-pay | Admitting: Physical Therapy

## 2019-08-05 ENCOUNTER — Ambulatory Visit: Payer: Medicare Other | Attending: Internal Medicine | Admitting: Physical Therapy

## 2019-08-05 ENCOUNTER — Other Ambulatory Visit: Payer: Self-pay

## 2019-08-05 DIAGNOSIS — M6281 Muscle weakness (generalized): Secondary | ICD-10-CM | POA: Insufficient documentation

## 2019-08-05 DIAGNOSIS — R293 Abnormal posture: Secondary | ICD-10-CM | POA: Diagnosis present

## 2019-08-05 NOTE — Therapy (Addendum)
[facility] [address], [place], [place], [address] Phone: [phone]   Fax:  [phone]  Physical Therapy Evaluation  Patient Details  Name: Ashley Lawson MRN: [id number] Date of Birth: May 13, 1932 Referring Provider (PT): Gatha Mayer, MD   Encounter Date: 08/05/2019  PT End of Session - 08/05/19 1113    Visit Number  1    Date for PT Re-Evaluation  10/28/19    PT Start Time  1100    PT Stop Time  1138    PT Time Calculation (min)  38 min    Activity Tolerance  Patient tolerated treatment well    Behavior During Therapy  [facility] for tasks assessed/performed       Past Medical History:  Diagnosis Date  . Abdominal pain 12/2009   hospitalized  . Allergic rhinitis   . Chronic constipation   . Compression fx, lumbar spine ([place])   . DDD (degenerative disc disease), lumbar   . Diverticulosis of colon   . Dog bite(E906.0) 08/30/2011  . GERD (gastroesophageal reflux disease)   . Hepatic steatosis   . Hyperlipidemia   . Hyperplastic colon polyp   . IBS (irritable bowel syndrome)    constipation predominant  . Internal hemorrhoids   . Osteoporosis    dexa 2011 -2.7 hip nl spine  . PAF (paroxysmal atrial fibrillation) ([place])    a. dx 05/2017.  Marland Kitchen PMR (polymyalgia rheumatica) (HCC) 08/30/2011   under rheum care  on low dose pred 5 mg   . Renal disorder     Past Surgical History:  Procedure Laterality Date  . ABDOMINAL HYSTERECTOMY     with bladder tact and rectocele repair  . CATARACT EXTRACTION  2010   both  . COLONOSCOPY  2016 was most recent    There were no vitals filed for this visit.   Subjective Assessment - 08/05/19 1101    Subjective  Pt has had Lt groin pain and has started coming up into the lower left quadrant.  Pt has to take mirilax in order to BM.  Pt denies leakage of bowel or bladder.  Pt states sometimes she doesn't empty the bladder completely.    Pertinent History  groin and abdominal  pain; chronic constipation, IBS, diverticulitis, hx of bladder tacking and hysterectomy    Patient Stated Goals  get rid of the pain and be able to walk    Currently in Pain?  Yes    Pain Score  6     Pain Location  Abdomen    Pain Orientation  Lower;Left    Pain Descriptors / Indicators  Aching    Pain Type  Chronic pain    Pain Radiating Towards  lower abdomen    Pain Onset  More than a month ago   started in March   Aggravating Factors   going to the bathroom too much    Pain Relieving Factors  having a BM    Effect of Pain on Daily Activities  I haven't been doing too much recently    Multiple Pain Sites  No         [facility] PT Assessment - 08/06/19 0001      Assessment   Medical Diagnosis  R39.198 (ICD-10-CM) - Difficulty in urination    Referring Provider (PT)  Gatha Mayer, MD    Prior Therapy  No      Precautions   Precautions  None      Restrictions  Weight Bearing Restrictions  No      Balance Screen   Has the patient fallen in the past 6 months  Yes    How many times?  1    Has the patient had a decrease in activity level because of a fear of falling?   No    Is the patient reluctant to leave their home because of a fear of falling?   No      Home Environment   Living Environment  Private residence    Living Arrangements  Children   daughter Benedetto Goad     Prior Function   Level of Independence  Independent      Cognition   Overall Cognitive Status  Within Functional Limits for tasks assessed      Posture/Postural Control   Posture/Postural Control  Postural limitations    Postural Limitations  Rounded Shoulders;Increased thoracic kyphosis    Posture Comments  trunk Lean Rt; sits on Lateral Lt hip      PROM   Overall PROM Comments  Rt hip rotation 50%; Lt hip flexion 50%      Strength   Overall Strength Comments  Lt hip 4-/5; Rt hip 4/5 abduction      Flexibility   Soft Tissue Assessment /Muscle Length  yes    Hamstrings  75      Palpation    Palpation comment  TTP lumbar erectors and gluteal attachments Lt>Rt; hip flexor Lt side TTP      Ambulation/Gait   Gait Pattern  Decreased stride length;Decreased weight shift to left                Objective measurements completed on examination: See above findings.    Pelvic Floor Special Questions - 08/06/19 0001    Prior Pelvic/Prostate Exam  Yes    Prior Pregnancies  Yes    Number of Pregnancies  2    Number of Vaginal Deliveries  2    Any difficulty with labor and deliveries  Yes    Currently Sexually Active  No    Urinary Leakage  No    Urinary urgency  No    Urinary frequency  dysuria    Fecal incontinence  No    Falling out feeling (prolapse)  Yes   feel like something is pressing on the bladder   Skin Integrity  Intact;Erthema;Hemorroids    Perineal Body/Introitus   Descended    Prolapse  Anterior Wall    Pelvic Floor Internal Exam  pt identity confirmed and informed consent given to perform internal soft tissue    Exam Type  Vaginal    Palpation  bladder palpated lower in pelvic bowl    Strength  weak squeeze, no lift    Strength # of seconds  5    Tone  low                 PT Short Term Goals - 08/06/19 1100      PT SHORT TERM GOAL #1   Title  25% less pain    Time  6    Period  Weeks    Status  New    Target Date  09/16/19      PT SHORT TERM GOAL #2   Title  ind with toileting techniques    Time  6    Period  Weeks    Status  New    Target Date  09/16/19  PT Long Term Goals - 08/06/19 1048      PT LONG TERM GOAL #1   Title  Pt will report at least 60% less abdominal discomfort due to improved ability to coordinate respiratory and pelvic diaphragms during funcitonal movements.    Time  12    Period  Weeks    Status  New    Target Date  10/28/19      PT LONG TERM GOAL #2   Title  Pt will report she is able to empty bladder and feel no sensation of urinary retention.    Time  12    Period  Weeks    Status  New     Target Date  10/28/19      PT LONG TERM GOAL #3   Title  Pt will be ind with advanced HEP    Time  12    Period  Weeks    Status  New    Target Date  10/28/19      PT LONG TERM GOAL #4   Title  Pt will improved pelvic strength to 3/5 and able to hold at least 10 seconds for improved trunk stability    Time  12    Period  Weeks    Status  New    Target Date  10/28/19             Plan - 08/06/19 1054    Clinical Impression Statement  Pt presents to skilled PT due to abdominal pain. She has also been having a sensation of being unable to completely empty her bladder.  Pt has history of constipation, hysterectomy, and bladder tacking.  Pt has weakness of pelvic floor assessed at 2/5 MMT and only holds contraction for 5 sec.  Pt has decreased ROM and hip weakness as noted above.  She sits with more weight through Lt hip and rounded shoulders and standing trunk lean Rt.  Pt will benefit from skilled PT to address impairments in order to function better during normal daily activities and return to her usual level of activities that she is used to doing.    Personal Factors and Comorbidities  Comorbidity 3+;Time since onset of injury/illness/exacerbation    Comorbidities  groin and abdominal pain; chronic constipation, IBS, diverticulitis, hx of bladder tacking and hysterectomy    Examination-Activity Limitations  Toileting    Examination-Participation Restrictions  Community Activity;Cleaning    Stability/Clinical Decision Making  Evolving/Moderate complexity    Clinical Decision Making  Moderate    Rehab Potential  Excellent    PT Frequency  2x / week    PT Duration  12 weeks    PT Treatment/Interventions  ADLs/Self Care Home Management;Biofeedback;Cryotherapy;Electrical Stimulation;Moist Heat;Therapeutic activities;Neuromuscular re-education;Therapeutic exercise;Patient/family education;Dry needling;Passive range of motion;Manual techniques    PT Next Visit Plan  breathing and core  activation, pelvic floor strengthening with tactile cues; tactile cues to bulge and training for voiding and defication    Consulted and Agree with Plan of Care  Patient       Patient will benefit from skilled therapeutic intervention in order to improve the following deficits and impairments:  Pain, Postural dysfunction, Increased fascial restricitons, Decreased strength, Decreased coordination, Decreased range of motion, Increased muscle spasms  Visit Diagnosis: Muscle weakness (generalized)  Abnormal posture     Problem List Patient Active Problem List   Diagnosis Date Noted  . Syncope 06/17/2019  . Seasonal allergies 06/17/2019  . DDD (degenerative disc disease), lumbar   . Proctocolitis   .  CKD (chronic kidney disease), stage III   . Internal hemorrhoids   . Dizziness 09/17/2018  . Abdominal pain 09/17/2018  . AF (paroxysmal atrial fibrillation) ([place]) 09/17/2018  . Chest discomfort 05/13/2018  . Coagulation defect ([place]) 01/30/2018  . Deviated septum 01/30/2018  . Epistaxis, recurrent 01/30/2018  . Atrial fibrillation with RVR ([place]) 05/02/2017  . Rhinitis, allergic 05/31/2015  . Renal cyst incidental  01/19/2015  . Heartburn 01/14/2014  . GERD (gastroesophageal reflux disease) 01/14/2014  . Osteoporosis 01/14/2014  . IBS (irritable bowel syndrome)   . Nail, ingrown 06/22/2012  . Arthritis 02/19/2012  . Screening, lipid 02/18/2011  . Rash and nonspecific skin eruption 02/18/2011  . Visit for preventive health examination 02/18/2011  . Skin cancer 10/14/2010  . Abdominal bloating 10/08/2010  . Chronic constipation 01/15/2010  . Lower urinary tract symptoms (LUTS) 01/15/2010  . SUPRAPUBIC PAIN 08/01/2009  . ABDOMINAL PAIN, EPIGASTRIC 06/16/2009  . DIVERTICULOSIS, COLON 06/07/2007  . Hyperlipidemia 06/05/2007  . ANXIETY STATE, UNSPECIFIED 06/05/2007  . Allergic rhinitis 06/05/2007  . IBS 06/05/2007  . OSTEOPOROSIS 06/05/2007  . ABDOMINAL PAIN, GENERALIZED  06/05/2007    Camillo Flaming Holden Maniscalco,PT 08/06/2019, 11:02 AM  [facility] [address] [place], [place], [address] Phone: [phone]   Fax:  [phone]  Name: EMMALEAH MERONEY MRN: [id number] Date of Birth: 07-25-1932

## 2019-08-06 NOTE — Addendum Note (Signed)
Addended by: Jari Favre L on: 08/06/2019 11:03 AM   Modules accepted: Orders

## 2019-08-09 ENCOUNTER — Ambulatory Visit: Payer: Medicare Other | Admitting: Physical Therapy

## 2019-08-09 ENCOUNTER — Encounter: Payer: Self-pay | Admitting: Physical Therapy

## 2019-08-09 ENCOUNTER — Other Ambulatory Visit: Payer: Self-pay

## 2019-08-09 DIAGNOSIS — M6281 Muscle weakness (generalized): Secondary | ICD-10-CM

## 2019-08-09 DIAGNOSIS — R293 Abnormal posture: Secondary | ICD-10-CM

## 2019-08-09 NOTE — Therapy (Signed)
[facility] [address], [place], [place], [address] Phone: [phone]   Fax:  [phone]  Physical Therapy Treatment  Patient Details  Name: Ashley Lawson MRN: [id number] Date of Birth: [birth year] Referring Provider (PT): Gatha Mayer, MD   Encounter Date: 08/09/2019  PT End of Session - 08/09/19 1126    Visit Number  2    Date for PT Re-Evaluation  10/28/19    PT Start Time  1105    PT Stop Time  1146    PT Time Calculation (min)  41 min    Activity Tolerance  Patient tolerated treatment well    Behavior During Therapy  [facility] for tasks assessed/performed       Past Medical History:  Diagnosis Date  . Abdominal pain 12/2009   hospitalized  . Allergic rhinitis   . Chronic constipation   . Compression fx, lumbar spine ([place])   . DDD (degenerative disc disease), lumbar   . Diverticulosis of colon   . Dog bite(E906.0) 08/30/2011  . GERD (gastroesophageal reflux disease)   . Hepatic steatosis   . Hyperlipidemia   . Hyperplastic colon polyp   . IBS (irritable bowel syndrome)    constipation predominant  . Internal hemorrhoids   . Osteoporosis    dexa 2011 -2.7 hip nl spine  . PAF (paroxysmal atrial fibrillation) ([place])    a. dx 05/2017.  Marland Kitchen PMR (polymyalgia rheumatica) (HCC) 08/30/2011   under rheum care  on low dose pred 5 mg   . Renal disorder     Past Surgical History:  Procedure Laterality Date  . ABDOMINAL HYSTERECTOMY     with bladder tact and rectocele repair  . CATARACT EXTRACTION  2010   both  . COLONOSCOPY  2016 was most recent    There were no vitals filed for this visit.  Subjective Assessment - 08/09/19 1107    Subjective  I felt a little better after eval but walking caused a lot of pain.    Patient Stated Goals  get rid of the pain and be able to walk    Currently in Pain?  No/denies                       [facility] Adult PT Treatment/Exercise - 08/09/19 0001      Self-Care   Self-Care  Other Self-Care Comments    Other Self-Care Comments   breathing and toileting technique - relax and bulge demo and performed      Manual Therapy   Manual Therapy  Myofascial release;Internal Pelvic Floor    Manual therapy comments  identity confirmed and informed consent given to perform internal STM             PT Education - 08/09/19 1145    Education Details  breathing and bulging toilet techniques       PT Short Term Goals - 08/06/19 1100      PT SHORT TERM GOAL #1   Title  25% less pain    Time  6    Period  Weeks    Status  New    Target Date  09/16/19      PT SHORT TERM GOAL #2   Title  ind with toileting techniques    Time  6    Period  Weeks    Status  New    Target Date  09/16/19        PT Long Term  Goals - 08/06/19 1048      PT LONG TERM GOAL #1   Title  Pt will report at least 60% less abdominal discomfort due to improved ability to coordinate respiratory and pelvic diaphragms during funcitonal movements.    Time  12    Period  Weeks    Status  New    Target Date  10/28/19      PT LONG TERM GOAL #2   Title  Pt will report she is able to empty bladder and feel no sensation of urinary retention.    Time  12    Period  Weeks    Status  New    Target Date  10/28/19      PT LONG TERM GOAL #3   Title  Pt will be ind with advanced HEP    Time  12    Period  Weeks    Status  New    Target Date  10/28/19      PT LONG TERM GOAL #4   Title  Pt will improved pelvic strength to 3/5 and able to hold at least 10 seconds for improved trunk stability    Time  12    Period  Weeks    Status  New    Target Date  10/28/19            Plan - 08/09/19 1146    Clinical Impression Statement  Pt responded well to internal STM to Lt side and abdominal fascial release.  Had a good response with less tone after treatment today.  She has a lot of tension throughout lumbar and gluteals as well and responded well to STM. Pt was educated  in breathing and toileting techniques and able to perform correctly.  She will benefit from skilled PT to work on muscle strength and coordination for improved control of toileting.    Comorbidities  groin and abdominal pain; chronic constipation, IBS, diverticulitis, hx of bladder tacking and hysterectomy    PT Treatment/Interventions  ADLs/Self Care Home Management;Biofeedback;Cryotherapy;Electrical Stimulation;Moist Heat;Therapeutic activities;Neuromuscular re-education;Therapeutic exercise;Patient/family education;Dry needling;Passive range of motion;Manual techniques    PT Next Visit Plan  f/u on toileting; stretches with breathing glutes and lumbar    PT Home Exercise Plan  toilet techniques    Consulted and Agree with Plan of Care  Patient       Patient will benefit from skilled therapeutic intervention in order to improve the following deficits and impairments:  Pain, Postural dysfunction, Increased fascial restricitons, Decreased strength, Decreased coordination, Decreased range of motion, Increased muscle spasms  Visit Diagnosis: Muscle weakness (generalized)  Abnormal posture     Problem List Patient Active Problem List   Diagnosis Date Noted  . Syncope 06/17/2019  . Seasonal allergies 06/17/2019  . DDD (degenerative disc disease), lumbar   . Proctocolitis   . CKD (chronic kidney disease), stage III   . Internal hemorrhoids   . Dizziness 09/17/2018  . Abdominal pain 09/17/2018  . AF (paroxysmal atrial fibrillation) ([place]) 09/17/2018  . Chest discomfort 05/13/2018  . Coagulation defect ([place]) 01/30/2018  . Deviated septum 01/30/2018  . Epistaxis, recurrent 01/30/2018  . Atrial fibrillation with RVR ([place]) 05/02/2017  . Rhinitis, allergic 05/31/2015  . Renal cyst incidental  01/19/2015  . Heartburn 01/14/2014  . GERD (gastroesophageal reflux disease) 01/14/2014  . Osteoporosis 01/14/2014  . IBS (irritable bowel syndrome)   . Nail, ingrown 06/22/2012  . Arthritis  02/19/2012  . Screening, lipid 02/18/2011  . Rash and nonspecific skin  eruption 02/18/2011  . Visit for preventive health examination 02/18/2011  . Skin cancer 10/14/2010  . Abdominal bloating 10/08/2010  . Chronic constipation 01/15/2010  . Lower urinary tract symptoms (LUTS) 01/15/2010  . SUPRAPUBIC PAIN 08/01/2009  . ABDOMINAL PAIN, EPIGASTRIC 06/16/2009  . DIVERTICULOSIS, COLON 06/07/2007  . Hyperlipidemia 06/05/2007  . ANXIETY STATE, UNSPECIFIED 06/05/2007  . Allergic rhinitis 06/05/2007  . IBS 06/05/2007  . OSTEOPOROSIS 06/05/2007  . ABDOMINAL PAIN, GENERALIZED 06/05/2007    Jule Ser, PT 08/09/2019, 2:01 PM  [facility] [address] [place], [place], [address] Phone: [phone]   Fax:  [phone]  Name: ISYSS ESPINAL MRN: [id number] Date of Birth: 19-Oct-1932

## 2019-08-09 NOTE — Patient Instructions (Addendum)
Breathing during bowel movement:  1. Back straight - sit up low back to upper back not rounding forward - look straight ahead 2. Lean forward - only as far as possible with back staying straight 3. Breathe - slow as you can inhaling down into your belly and feeling pressure on the pelvic floor 4. Hard belly - keep belly like a hard ball on the max inhale 5. Blow hard - like blowing up a balloon and pressure down into pelvic floor 6. Squeeze and lift - tighten pelvic floor after BM to reset everything back into place  [facility] [address] [place], [place] [address] Phone # [phone] Fax [phone]

## 2019-08-11 ENCOUNTER — Other Ambulatory Visit: Payer: Self-pay

## 2019-08-11 ENCOUNTER — Encounter: Payer: Self-pay | Admitting: Physical Therapy

## 2019-08-11 ENCOUNTER — Ambulatory Visit: Payer: Medicare Other | Admitting: Physical Therapy

## 2019-08-11 DIAGNOSIS — M6281 Muscle weakness (generalized): Secondary | ICD-10-CM | POA: Diagnosis not present

## 2019-08-11 DIAGNOSIS — R293 Abnormal posture: Secondary | ICD-10-CM

## 2019-08-11 NOTE — Therapy (Signed)
[facility] [address], [place], [place], [address] Phone: [phone]   Fax:  [phone]  Physical Therapy Treatment  Patient Details  Name: Ashley Lawson MRN: [id number] Date of Birth: 1932/05/25 Referring Provider (PT): Gatha Mayer, MD   Encounter Date: 08/11/2019  PT End of Session - 08/11/19 1444    Visit Number  3    Date for PT Re-Evaluation  10/28/19    PT Start Time  1444    PT Stop Time  1524    PT Time Calculation (min)  40 min    Activity Tolerance  Patient tolerated treatment well    Behavior During Therapy  [facility] for tasks assessed/performed       Past Medical History:  Diagnosis Date  . Abdominal pain 12/2009   hospitalized  . Allergic rhinitis   . Chronic constipation   . Compression fx, lumbar spine ([place])   . DDD (degenerative disc disease), lumbar   . Diverticulosis of colon   . Dog bite(E906.0) 08/30/2011  . GERD (gastroesophageal reflux disease)   . Hepatic steatosis   . Hyperlipidemia   . Hyperplastic colon polyp   . IBS (irritable bowel syndrome)    constipation predominant  . Internal hemorrhoids   . Osteoporosis    dexa 2011 -2.7 hip nl spine  . PAF (paroxysmal atrial fibrillation) ([place])    a. dx 05/2017.  Marland Kitchen PMR (polymyalgia rheumatica) (HCC) 08/30/2011   under rheum care  on low dose pred 5 mg   . Renal disorder     Past Surgical History:  Procedure Laterality Date  . ABDOMINAL HYSTERECTOMY     with bladder tact and rectocele repair  . CATARACT EXTRACTION  2010   both  . COLONOSCOPY  2016 was most recent    There were no vitals filed for this visit.  Subjective Assessment - 08/11/19 1534    Subjective  It feels better but still sore on the side.  The groin feels better though.  Hurts when walking more, denies pain currently    Patient Stated Goals  get rid of the pain and be able to walk    Currently in Pain?  No/denies                         [facility] Adult PT Treatment/Exercise - 08/11/19 0001      Exercises   Exercises  Lumbar      Lumbar Exercises: Stretches   Active Hamstring Stretch  Right;Left;2 reps;20 seconds    Piriformis Stretch  Right;Left;20 seconds      Lumbar Exercises: Supine   Ab Set  10 reps;2 seconds    Bent Knee Raise  10 reps    Other Supine Lumbar Exercises  bent knee drop out      Manual Therapy   Manual Therapy  Myofascial release    Myofascial Release  abdomen Rt/Lt colon and midline             PT Education - 08/11/19 1539    Education Details  Access Code: [email or web address]    Person(s) Educated  Patient    Methods  Explanation;Demonstration;Verbal cues;Handout    Comprehension  Verbalized understanding;Returned demonstration       PT Short Term Goals - 08/06/19 1100      PT SHORT TERM GOAL #1   Title  25% less pain    Time  6    Period  Weeks  Status  New    Target Date  09/16/19      PT SHORT TERM GOAL #2   Title  ind with toileting techniques    Time  6    Period  Weeks    Status  New    Target Date  09/16/19        PT Long Term Goals - 08/06/19 1048      PT LONG TERM GOAL #1   Title  Pt will report at least 60% less abdominal discomfort due to improved ability to coordinate respiratory and pelvic diaphragms during funcitonal movements.    Time  12    Period  Weeks    Status  New    Target Date  10/28/19      PT LONG TERM GOAL #2   Title  Pt will report she is able to empty bladder and feel no sensation of urinary retention.    Time  12    Period  Weeks    Status  New    Target Date  10/28/19      PT LONG TERM GOAL #3   Title  Pt will be ind with advanced HEP    Time  12    Period  Weeks    Status  New    Target Date  10/28/19      PT LONG TERM GOAL #4   Title  Pt will improved pelvic strength to 3/5 and able to hold at least 10 seconds for improved trunk stability    Time  12    Period  Weeks    Status  New    Target  Date  10/28/19            Plan - 08/11/19 1535    Clinical Impression Statement  Pt is responding well to fascial release.  She had reduced pain with compression on the Lt side today and felt good when cued to activate TrA.  She needed cues to engage with exhale and had her make an "s" sound to ensure she was breathing correctly.  Pt able to perform correctly after cues and able to add to HEP.    PT Treatment/Interventions  ADLs/Self Care Home Management;Biofeedback;Cryotherapy;Electrical Stimulation;Moist Heat;Therapeutic activities;Neuromuscular re-education;Therapeutic exercise;Patient/family education;Dry needling;Passive range of motion;Manual techniques    PT Next Visit Plan  f/u with initial HEP; core strength, breathing and bulging, internal STM if unable to get improved excursion    Consulted and Agree with Plan of Care  Patient       Patient will benefit from skilled therapeutic intervention in order to improve the following deficits and impairments:  Pain, Postural dysfunction, Increased fascial restricitons, Decreased strength, Decreased coordination, Decreased range of motion, Increased muscle spasms  Visit Diagnosis: Muscle weakness (generalized)  Abnormal posture     Problem List Patient Active Problem List   Diagnosis Date Noted  . Syncope 06/17/2019  . Seasonal allergies 06/17/2019  . DDD (degenerative disc disease), lumbar   . Proctocolitis   . CKD (chronic kidney disease), stage III   . Internal hemorrhoids   . Dizziness 09/17/2018  . Abdominal pain 09/17/2018  . AF (paroxysmal atrial fibrillation) ([place]) 09/17/2018  . Chest discomfort 05/13/2018  . Coagulation defect ([place]) 01/30/2018  . Deviated septum 01/30/2018  . Epistaxis, recurrent 01/30/2018  . Atrial fibrillation with RVR ([place]) 05/02/2017  . Rhinitis, allergic 05/31/2015  . Renal cyst incidental  01/19/2015  . Heartburn 01/14/2014  . GERD (gastroesophageal reflux disease) 01/14/2014  .  Osteoporosis 01/14/2014  . IBS (irritable bowel syndrome)   . Nail, ingrown 06/22/2012  . Arthritis 02/19/2012  . Screening, lipid 02/18/2011  . Rash and nonspecific skin eruption 02/18/2011  . Visit for preventive health examination 02/18/2011  . Skin cancer 10/14/2010  . Abdominal bloating 10/08/2010  . Chronic constipation 01/15/2010  . Lower urinary tract symptoms (LUTS) 01/15/2010  . SUPRAPUBIC PAIN 08/01/2009  . ABDOMINAL PAIN, EPIGASTRIC 06/16/2009  . DIVERTICULOSIS, COLON 06/07/2007  . Hyperlipidemia 06/05/2007  . ANXIETY STATE, UNSPECIFIED 06/05/2007  . Allergic rhinitis 06/05/2007  . IBS 06/05/2007  . OSTEOPOROSIS 06/05/2007  . ABDOMINAL PAIN, GENERALIZED 06/05/2007    Jule Ser, PT 08/11/2019, 4:19 PM  [facility] [address], [place], [place], [address] Phone: [phone]   Fax:  [phone]  Name: YERALDY SPIKE MRN: [id number] Date of Birth: 23-Jun-1932

## 2019-08-11 NOTE — Therapy (Signed)
[facility] [address] [place], [place], [address] Phone: [phone]   Fax:  [phone]  Physical Therapy Treatment  Patient Details  Name: Ashley Lawson MRN: [id number] Date of Birth: Jul 26, 1932 Referring Provider (PT): Gatha Mayer, MD   Encounter Date: 08/11/2019  PT End of Session - 08/11/19 1444    Visit Number  3    Date for PT Re-Evaluation  10/28/19    PT Start Time  1444    PT Stop Time  1524    PT Time Calculation (min)  40 min    Activity Tolerance  Patient tolerated treatment well    Behavior During Therapy  [facility] for tasks assessed/performed       Past Medical History:  Diagnosis Date  . Abdominal pain 12/2009   hospitalized  . Allergic rhinitis   . Chronic constipation   . Compression fx, lumbar spine ([place])   . DDD (degenerative disc disease), lumbar   . Diverticulosis of colon   . Dog bite(E906.0) 08/30/2011  . GERD (gastroesophageal reflux disease)   . Hepatic steatosis   . Hyperlipidemia   . Hyperplastic colon polyp   . IBS (irritable bowel syndrome)    constipation predominant  . Internal hemorrhoids   . Osteoporosis    dexa 2011 -2.7 hip nl spine  . PAF (paroxysmal atrial fibrillation) ([place])    a. dx 05/2017.  Marland Kitchen PMR (polymyalgia rheumatica) (HCC) 08/30/2011   under rheum care  on low dose pred 5 mg   . Renal disorder     Past Surgical History:  Procedure Laterality Date  . ABDOMINAL HYSTERECTOMY     with bladder tact and rectocele repair  . CATARACT EXTRACTION  2010   both  . COLONOSCOPY  2016 was most recent    There were no vitals filed for this visit.                               PT Short Term Goals - 08/06/19 1100      PT SHORT TERM GOAL #1   Title  25% less pain    Time  6    Period  Weeks    Status  New    Target Date  09/16/19      PT SHORT TERM GOAL #2   Title  ind with toileting techniques    Time  6    Period  Weeks    Status  New    Target Date  09/16/19        PT Long Term Goals - 08/06/19 1048      PT LONG TERM GOAL #1   Title  Pt will report at least 60% less abdominal discomfort due to improved ability to coordinate respiratory and pelvic diaphragms during funcitonal movements.    Time  12    Period  Weeks    Status  New    Target Date  10/28/19      PT LONG TERM GOAL #2   Title  Pt will report she is able to empty bladder and feel no sensation of urinary retention.    Time  12    Period  Weeks    Status  New    Target Date  10/28/19      PT LONG TERM GOAL #3   Title  Pt will be ind with advanced HEP    Time  12  Period  Weeks    Status  New    Target Date  10/28/19      PT LONG TERM GOAL #4   Title  Pt will improved pelvic strength to 3/5 and able to hold at least 10 seconds for improved trunk stability    Time  12    Period  Weeks    Status  New    Target Date  10/28/19              Patient will benefit from skilled therapeutic intervention in order to improve the following deficits and impairments:     Visit Diagnosis: Muscle weakness (generalized)  Abnormal posture     Problem List Patient Active Problem List   Diagnosis Date Noted  . Syncope 06/17/2019  . Seasonal allergies 06/17/2019  . DDD (degenerative disc disease), lumbar   . Proctocolitis   . CKD (chronic kidney disease), stage III   . Internal hemorrhoids   . Dizziness 09/17/2018  . Abdominal pain 09/17/2018  . AF (paroxysmal atrial fibrillation) ([place]) 09/17/2018  . Chest discomfort 05/13/2018  . Coagulation defect ([place]) 01/30/2018  . Deviated septum 01/30/2018  . Epistaxis, recurrent 01/30/2018  . Atrial fibrillation with RVR ([place]) 05/02/2017  . Rhinitis, allergic 05/31/2015  . Renal cyst incidental  01/19/2015  . Heartburn 01/14/2014  . GERD (gastroesophageal reflux disease) 01/14/2014  . Osteoporosis 01/14/2014  . IBS (irritable bowel syndrome)   . Nail, ingrown 06/22/2012  .  Arthritis 02/19/2012  . Screening, lipid 02/18/2011  . Rash and nonspecific skin eruption 02/18/2011  . Visit for preventive health examination 02/18/2011  . Skin cancer 10/14/2010  . Abdominal bloating 10/08/2010  . Chronic constipation 01/15/2010  . Lower urinary tract symptoms (LUTS) 01/15/2010  . SUPRAPUBIC PAIN 08/01/2009  . ABDOMINAL PAIN, EPIGASTRIC 06/16/2009  . DIVERTICULOSIS, COLON 06/07/2007  . Hyperlipidemia 06/05/2007  . ANXIETY STATE, UNSPECIFIED 06/05/2007  . Allergic rhinitis 06/05/2007  . IBS 06/05/2007  . OSTEOPOROSIS 06/05/2007  . ABDOMINAL PAIN, GENERALIZED 06/05/2007    Jule Ser, PT 08/11/2019, 3:33 PM  [facility] [address] [place], [place], [address] Phone: [phone]   Fax:  [phone]  Name: Ashley Lawson MRN: [id number] Date of Birth: 1932/04/08

## 2019-08-11 NOTE — Patient Instructions (Signed)
Access Code: [email or web address] URL: [email or web address] Date: 08/11/2019 Prepared by: Jari Favre  Exercises Bent Knee Fallouts - 1 x daily - 7 x weekly - 10 reps - 3 sets Supine Heel Slide - 1 x daily - 7 x weekly - 10 reps - 3 sets Seated Hamstring Stretch - 1 x daily - 7 x weekly - 3 reps - 1 sets - 30 sec hold Standing Hamstring Stretch with Step - 1 x daily - 7 x weekly - 3 reps - 1 sets - 30 sec hold

## 2019-08-23 ENCOUNTER — Other Ambulatory Visit: Payer: Self-pay

## 2019-08-23 ENCOUNTER — Encounter: Payer: Self-pay | Admitting: Physical Therapy

## 2019-08-23 ENCOUNTER — Ambulatory Visit: Payer: Medicare Other | Admitting: Physical Therapy

## 2019-08-23 DIAGNOSIS — R293 Abnormal posture: Secondary | ICD-10-CM

## 2019-08-23 DIAGNOSIS — M6281 Muscle weakness (generalized): Secondary | ICD-10-CM | POA: Diagnosis not present

## 2019-08-23 NOTE — Patient Instructions (Signed)
Access Code: [email or web address] URL: [email or web address] Date: 08/11/2019 Prepared by: Jari Favre  Exercises Bent Knee Fallouts - 1 x daily - 7 x weekly - 10 reps - 3 sets Supine Heel Slide - 1 x daily - 7 x weekly - 10 reps - 3 sets Seated Hamstring Stretch - 1 x daily - 7 x weekly - 3 reps - 1 sets - 30 sec hold Standing Hamstring Stretch with Step - 1 x daily - 7 x weekly - 3 reps - 1 sets - 30 sec hold Hooklying Clamshells with Resistance - 1 x daily - 7 x weekly - 3 sets - 10 reps

## 2019-08-23 NOTE — Therapy (Signed)
[facility] [address], [place], [place], [address] Phone: [phone]   Fax:  [phone]  Physical Therapy Treatment  Patient Details  Name: Ashley Lawson MRN: [id number] Date of Birth: 1932-12-22 Referring Provider (PT): Gatha Mayer, MD   Encounter Date: 08/23/2019  PT End of Session - 08/23/19 1401    Visit Number  4    Date for PT Re-Evaluation  10/28/19    PT Start Time  1225    PT Stop Time  1305    PT Time Calculation (min)  40 min    Activity Tolerance  Patient tolerated treatment well    Behavior During Therapy  [facility] for tasks assessed/performed       Past Medical History:  Diagnosis Date  . Abdominal pain 12/2009   hospitalized  . Allergic rhinitis   . Chronic constipation   . Compression fx, lumbar spine ([place])   . DDD (degenerative disc disease), lumbar   . Diverticulosis of colon   . Dog bite(E906.0) 08/30/2011  . GERD (gastroesophageal reflux disease)   . Hepatic steatosis   . Hyperlipidemia   . Hyperplastic colon polyp   . IBS (irritable bowel syndrome)    constipation predominant  . Internal hemorrhoids   . Osteoporosis    dexa 2011 -2.7 hip nl spine  . PAF (paroxysmal atrial fibrillation) ([place])    a. dx 05/2017.  Marland Kitchen PMR (polymyalgia rheumatica) (HCC) 08/30/2011   under rheum care  on low dose pred 5 mg   . Renal disorder     Past Surgical History:  Procedure Laterality Date  . ABDOMINAL HYSTERECTOMY     with bladder tact and rectocele repair  . CATARACT EXTRACTION  2010   both  . COLONOSCOPY  2016 was most recent    There were no vitals filed for this visit.  Subjective Assessment - 08/23/19 1242    Subjective  It is better with the stretches you gave me.    Pertinent History  groin and abdominal pain; chronic constipation, IBS, diverticulitis, hx of bladder tacking and hysterectomy    Patient Stated Goals  get rid of the pain and be able to walk    Currently in Pain?   Yes    Pain Score  4     Pain Location  Abdomen    Pain Orientation  Left;Lower    Pain Descriptors / Indicators  Aching;Nagging    Pain Type  Chronic pain    Pain Onset  More than a month ago    Pain Frequency  Intermittent    Aggravating Factors   not emptying bladder/bowel    Multiple Pain Sites  No                        [facility] Adult PT Treatment/Exercise - 08/23/19 0001      Lumbar Exercises: Supine   Dead Bug Limitations  dead bug UE isometric core    Straight Leg Raise  5 reps    Other Supine Lumbar Exercises  bent knee drop out    Other Supine Lumbar Exercises  clam red band - 10x      Manual Therapy   Myofascial Release  abdomen Rt/Lt colon and midline; transverse along diaphragm             PT Education - 08/23/19 1406    Education Details  Access Code: [email or web address]    Person(s) Educated  Patient  Methods  Explanation;Demonstration;Tactile cues;Verbal cues;Handout    Comprehension  Verbalized understanding;Returned demonstration       PT Short Term Goals - 08/06/19 1100      PT SHORT TERM GOAL #1   Title  25% less pain    Time  6    Period  Weeks    Status  New    Target Date  09/16/19      PT SHORT TERM GOAL #2   Title  ind with toileting techniques    Time  6    Period  Weeks    Status  New    Target Date  09/16/19        PT Long Term Goals - 08/06/19 1048      PT LONG TERM GOAL #1   Title  Pt will report at least 60% less abdominal discomfort due to improved ability to coordinate respiratory and pelvic diaphragms during funcitonal movements.    Time  12    Period  Weeks    Status  New    Target Date  10/28/19      PT LONG TERM GOAL #2   Title  Pt will report she is able to empty bladder and feel no sensation of urinary retention.    Time  12    Period  Weeks    Status  New    Target Date  10/28/19      PT LONG TERM GOAL #3   Title  Pt will be ind with advanced HEP    Time  12    Period  Weeks    Status  New     Target Date  10/28/19      PT LONG TERM GOAL #4   Title  Pt will improved pelvic strength to 3/5 and able to hold at least 10 seconds for improved trunk stability    Time  12    Period  Weeks    Status  New    Target Date  10/28/19            Plan - 08/23/19 1407    Clinical Impression Statement  Pt felt good with the exercises progressions.  She has lower abdominal weakness and tension in upper abdomen.  Fascial restrictions are persisting in the lower abdomen but overall feeling better with less pain and tenderness.  Pt will benefit from skilled PT to continue to work on increased core strength and muscle coordination for improved toileting.    Examination-Activity Limitations  Toileting    Examination-Participation Restrictions  Art gallery manager    PT Treatment/Interventions  ADLs/Self Care Home Management;Biofeedback;Cryotherapy;Electrical Stimulation;Moist Heat;Therapeutic activities;Neuromuscular re-education;Therapeutic exercise;Patient/family education;Dry needling;Passive range of motion;Manual techniques    PT Next Visit Plan  core and pelvic floor coordination, biofeedback, internal STM if needed for excursion    PT Home Exercise Plan  Access Code: [email or web address]    Consulted and Agree with Plan of Care  Patient       Patient will benefit from skilled therapeutic intervention in order to improve the following deficits and impairments:  Pain, Postural dysfunction, Increased fascial restricitons, Decreased strength, Decreased coordination, Decreased range of motion, Increased muscle spasms  Visit Diagnosis: Muscle weakness (generalized)  Abnormal posture     Problem List Patient Active Problem List   Diagnosis Date Noted  . Syncope 06/17/2019  . Seasonal allergies 06/17/2019  . DDD (degenerative disc disease), lumbar   . Proctocolitis   . CKD (chronic kidney disease), stage III   .  Internal hemorrhoids   . Dizziness 09/17/2018  . Abdominal pain 09/17/2018   . AF (paroxysmal atrial fibrillation) ([place]) 09/17/2018  . Chest discomfort 05/13/2018  . Coagulation defect ([place]) 01/30/2018  . Deviated septum 01/30/2018  . Epistaxis, recurrent 01/30/2018  . Atrial fibrillation with RVR ([place]) 05/02/2017  . Rhinitis, allergic 05/31/2015  . Renal cyst incidental  01/19/2015  . Heartburn 01/14/2014  . GERD (gastroesophageal reflux disease) 01/14/2014  . Osteoporosis 01/14/2014  . IBS (irritable bowel syndrome)   . Nail, ingrown 06/22/2012  . Arthritis 02/19/2012  . Screening, lipid 02/18/2011  . Rash and nonspecific skin eruption 02/18/2011  . Visit for preventive health examination 02/18/2011  . Skin cancer 10/14/2010  . Abdominal bloating 10/08/2010  . Chronic constipation 01/15/2010  . Lower urinary tract symptoms (LUTS) 01/15/2010  . SUPRAPUBIC PAIN 08/01/2009  . ABDOMINAL PAIN, EPIGASTRIC 06/16/2009  . DIVERTICULOSIS, COLON 06/07/2007  . Hyperlipidemia 06/05/2007  . ANXIETY STATE, UNSPECIFIED 06/05/2007  . Allergic rhinitis 06/05/2007  . IBS 06/05/2007  . OSTEOPOROSIS 06/05/2007  . ABDOMINAL PAIN, GENERALIZED 06/05/2007    Jule Ser, PT 08/23/2019, 3:23 PM  [facility] [address], [place], [place], [address] Phone: [phone]   Fax:  [phone]  Name: Ashley Lawson MRN: [id number] Date of Birth: September 17, 1932

## 2019-08-25 ENCOUNTER — Telehealth: Payer: Self-pay

## 2019-08-25 NOTE — Telephone Encounter (Signed)
LMTCB

## 2019-08-25 NOTE — Telephone Encounter (Signed)
-----   Message from Burnell Blanks, MD sent at 08/20/2019 10:35 AM EDT ----- No pauses on her monitor. She is known to have PAF. Can we let her know not to make any changes? Thanks, chris

## 2019-09-01 ENCOUNTER — Ambulatory Visit: Payer: Medicare Other | Attending: Internal Medicine | Admitting: Physical Therapy

## 2019-09-01 ENCOUNTER — Encounter: Payer: Self-pay | Admitting: Physical Therapy

## 2019-09-01 ENCOUNTER — Other Ambulatory Visit: Payer: Self-pay

## 2019-09-01 DIAGNOSIS — M6281 Muscle weakness (generalized): Secondary | ICD-10-CM | POA: Diagnosis not present

## 2019-09-01 DIAGNOSIS — R293 Abnormal posture: Secondary | ICD-10-CM | POA: Insufficient documentation

## 2019-09-01 NOTE — Therapy (Signed)
[facility] [address], [place], [place], [address] Phone: [phone]   Fax:  [phone]  Physical Therapy Treatment  Patient Details  Name: Ashley Lawson MRN: [id number] Date of Birth: [birth year] Referring Provider (PT): Gatha Mayer, MD   Encounter Date: 09/01/2019  PT End of Session - 09/01/19 1401    Visit Number  5    Date for PT Re-Evaluation  10/28/19    PT Start Time  1400    PT Stop Time  1440    PT Time Calculation (min)  40 min    Activity Tolerance  Patient tolerated treatment well    Behavior During Therapy  [facility] for tasks assessed/performed       Past Medical History:  Diagnosis Date  . Abdominal pain 12/2009   hospitalized  . Allergic rhinitis   . Chronic constipation   . Compression fx, lumbar spine ([place])   . DDD (degenerative disc disease), lumbar   . Diverticulosis of colon   . Dog bite(E906.0) 08/30/2011  . GERD (gastroesophageal reflux disease)   . Hepatic steatosis   . Hyperlipidemia   . Hyperplastic colon polyp   . IBS (irritable bowel syndrome)    constipation predominant  . Internal hemorrhoids   . Osteoporosis    dexa 2011 -2.7 hip nl spine  . PAF (paroxysmal atrial fibrillation) ([place])    a. dx 05/2017.  Marland Kitchen PMR (polymyalgia rheumatica) (HCC) 08/30/2011   under rheum care  on low dose pred 5 mg   . Renal disorder     Past Surgical History:  Procedure Laterality Date  . ABDOMINAL HYSTERECTOMY     with bladder tact and rectocele repair  . CATARACT EXTRACTION  2010   both  . COLONOSCOPY  2016 was most recent    There were no vitals filed for this visit.  Subjective Assessment - 09/01/19 1427    Subjective  I have been doing better with bowel movement and emptying bladder is better.    Patient Stated Goals  get rid of the pain and be able to walk    Currently in Pain?  No/denies                        [facility] Adult PT Treatment/Exercise - 09/01/19 0001       Lumbar Exercises: Stretches   Hip Flexor Stretch  3 reps;20 seconds    Quad Stretch  Left;1 rep;60 seconds      Lumbar Exercises: Seated   Other Seated Lumbar Exercises  UE flexion holding 2lb      Lumbar Exercises: Supine   Bridge  10 reps;3 seconds      Manual Therapy   Myofascial Release  abdomen Rt/Lt colon and midline; transverse along diaphragm               PT Short Term Goals - 08/06/19 1100      PT SHORT TERM GOAL #1   Title  25% less pain    Time  6    Period  Weeks    Status  New    Target Date  09/16/19      PT SHORT TERM GOAL #2   Title  ind with toileting techniques    Time  6    Period  Weeks    Status  New    Target Date  09/16/19        PT Long Term Goals - 09/01/19 1421  PT LONG TERM GOAL #2   Title  Pt will report she is able to empty bladder and feel no sensation of urinary retention.    Baseline  currently can empty most of the time    Status  On-going      PT LONG TERM GOAL #3   Title  Pt will be ind with advanced HEP    Status  On-going      PT LONG TERM GOAL #4   Title  Pt will improved pelvic strength to 3/5 and able to hold at least 10 seconds for improved trunk stability    Status  On-going            Plan - 09/01/19 1442    Clinical Impression Statement  Pt is making progress and doing the core strength exercises and stretches consistently.  Pt was able to progress strength in sitting and educated on sitting posture.  Pt sits on lateral left LE and sidebending to the left slightly in sitting.  It was improved with side bend stretch in opposite direction and cuing.  pt will benefit from skilled PT to continue working on core strength.    Comorbidities  groin and abdominal pain; chronic constipation, IBS, diverticulitis, hx of bladder tacking and hysterectomy    PT Treatment/Interventions  ADLs/Self Care Home Management;Biofeedback;Cryotherapy;Electrical Stimulation;Moist Heat;Therapeutic activities;Neuromuscular  re-education;Therapeutic exercise;Patient/family education;Dry needling;Passive range of motion;Manual techniques    PT Next Visit Plan  core and pelvic floor coordination, biofeedback, internal STM if needed for excursion    PT Home Exercise Plan  Access Code: [email or web address]    Consulted and Agree with Plan of Care  Patient       Patient will benefit from skilled therapeutic intervention in order to improve the following deficits and impairments:  Pain, Postural dysfunction, Increased fascial restricitons, Decreased strength, Decreased coordination, Decreased range of motion, Increased muscle spasms  Visit Diagnosis: Muscle weakness (generalized)  Abnormal posture     Problem List Patient Active Problem List   Diagnosis Date Noted  . Syncope 06/17/2019  . Seasonal allergies 06/17/2019  . DDD (degenerative disc disease), lumbar   . Proctocolitis   . CKD (chronic kidney disease), stage III   . Internal hemorrhoids   . Dizziness 09/17/2018  . Abdominal pain 09/17/2018  . AF (paroxysmal atrial fibrillation) ([place]) 09/17/2018  . Chest discomfort 05/13/2018  . Coagulation defect ([place]) 01/30/2018  . Deviated septum 01/30/2018  . Epistaxis, recurrent 01/30/2018  . Atrial fibrillation with RVR ([place]) 05/02/2017  . Rhinitis, allergic 05/31/2015  . Renal cyst incidental  01/19/2015  . Heartburn 01/14/2014  . GERD (gastroesophageal reflux disease) 01/14/2014  . Osteoporosis 01/14/2014  . IBS (irritable bowel syndrome)   . Nail, ingrown 06/22/2012  . Arthritis 02/19/2012  . Screening, lipid 02/18/2011  . Rash and nonspecific skin eruption 02/18/2011  . Visit for preventive health examination 02/18/2011  . Skin cancer 10/14/2010  . Abdominal bloating 10/08/2010  . Chronic constipation 01/15/2010  . Lower urinary tract symptoms (LUTS) 01/15/2010  . SUPRAPUBIC PAIN 08/01/2009  . ABDOMINAL PAIN, EPIGASTRIC 06/16/2009  . DIVERTICULOSIS, COLON 06/07/2007  . Hyperlipidemia 06/05/2007  .  ANXIETY STATE, UNSPECIFIED 06/05/2007  . Allergic rhinitis 06/05/2007  . IBS 06/05/2007  . OSTEOPOROSIS 06/05/2007  . ABDOMINAL PAIN, GENERALIZED 06/05/2007    Jule Ser, PT 09/01/2019, 3:28 PM  [facility] [address], [place], [place], [address] Phone: [phone]   Fax:  [phone]  Name: Ashley Lawson MRN:  [id number] Date of Birth: [birth year]

## 2019-09-02 ENCOUNTER — Other Ambulatory Visit: Payer: Self-pay | Admitting: Internal Medicine

## 2019-09-02 ENCOUNTER — Telehealth: Payer: Self-pay | Admitting: Cardiovascular Disease

## 2019-09-02 NOTE — Telephone Encounter (Signed)
Patient's daughter, Santiago Glad, is requesting to accompany the patient during her appointment scheduled for 09/06/19 with Dr. Angelena Form due to patient having issues with hearing. Please advise.

## 2019-09-02 NOTE — Telephone Encounter (Signed)
I spoke to the patient's daughter who will accompanying the patient to her appointment on Monday, because of hearing issues.

## 2019-09-03 NOTE — Telephone Encounter (Signed)
Last OV 06/28/2019  Last filled 08/02/2019, # [phone] with 0 refills  Can you fill in Dr. Velora Mediate absence?

## 2019-09-06 ENCOUNTER — Encounter: Payer: Self-pay | Admitting: Cardiovascular Disease

## 2019-09-06 ENCOUNTER — Other Ambulatory Visit: Payer: Self-pay

## 2019-09-06 ENCOUNTER — Ambulatory Visit: Payer: Medicare Other | Admitting: Cardiovascular Disease

## 2019-09-06 VITALS — BP 118/68 | HR 72 | Ht 59.0 in | Wt 159.0 lb

## 2019-09-06 DIAGNOSIS — I480 Paroxysmal atrial fibrillation: Secondary | ICD-10-CM

## 2019-09-06 DIAGNOSIS — I340 Nonrheumatic mitral (valve) insufficiency: Secondary | ICD-10-CM | POA: Diagnosis not present

## 2019-09-06 NOTE — Progress Notes (Signed)
Chief Complaint  Patient presents with  . Follow-up    PAF   History of Present Illness: 86 yo female with history of GERD, diverticulosis, IBS, HLD, PMR and paroxysmal atrial fibrillation who is here today for follow up. She was admitted to [facility] in January 2019 with a UTI and sinus infection and was found to be in atrial fibrillation. She was started on a beta blocker and Eliquis and converted to sinus. She was treated for hyponatremia. Echo 05/02/17 with LVEF=55-60%, mild MR. TSH was normal. She was seen in our office for follow up 05/2717 and was doing well but having some fatigue. Her beta blocker was stopped and Cardizem was started. She was admitted to [facility] June 2020 with vertigo and was treated with meclizine. She was in sinus. She was admitted to [facility] March 2021 with syncope in the setting of severe abdominal pain during a bowel movement. Telemetry showed transient rate controlled atrial fibrillation. Echo 06/18/19 with LVEF=65-70%. No valve disease. Her syncope was felt to be a vasovagal event. Cardiac monitor with atrial fib but no pauses.   She is here today for follow up. The patient denies any chest pain, dyspnea, palpitations, lower extremity edema, orthopnea, PND, dizziness, near syncope or syncope.   Primary Care Physician: Burnis Medin, MD  Past Medical History:  Diagnosis Date  . Abdominal pain 12/2009   hospitalized  . Allergic rhinitis   . Chronic constipation   . Compression fx, lumbar spine ([place])   . DDD (degenerative disc disease), lumbar   . Diverticulosis of colon   . Dog bite(E906.0) 08/30/2011  . GERD (gastroesophageal reflux disease)   . Hepatic steatosis   . Hyperlipidemia   . Hyperplastic colon polyp   . IBS (irritable bowel syndrome)    constipation predominant  . Internal hemorrhoids   . Osteoporosis    dexa 2011 -2.7 hip nl spine  . PAF (paroxysmal atrial fibrillation) ([place])    a. dx 05/2017.  Marland Kitchen PMR (polymyalgia rheumatica) (HCC) 08/30/2011   under  rheum care  on low dose pred 5 mg   . Renal disorder     Past Surgical History:  Procedure Laterality Date  . ABDOMINAL HYSTERECTOMY     with bladder tact and rectocele repair  . CATARACT EXTRACTION  2010   both  . COLONOSCOPY  2016 was most recent    Current Outpatient Medications  Medication Sig Dispense Refill  . acetaminophen (TYLENOL) 500 MG tablet Take 500 mg by mouth every 6 (six) hours as needed for mild pain, moderate pain or headache.    . AMBULATORY NON FORMULARY MEDICATION Medication Name: Diltiazem 2% with lidocaine 5% Insert a pea size amount into rectum to the first knuckle TID 30 g 3  . Ascorbic Acid (VITAMIN C) 100 MG tablet Take 100 mg by mouth daily.      . calcium carbonate (OS-CAL) 1250 (500 Ca) MG chewable tablet Chew 1 tablet by mouth 2 (two) times daily.    Marland Kitchen diltiazem (CARDIZEM CD) 120 MG 24 hr capsule TAKE 1 CAPSULE BY MOUTH EVERY DAY (Patient taking differently: Take 120 mg by mouth daily. ) 90 capsule 3  . ELIQUIS 5 MG TABS tablet TAKE 1 TABLET BY MOUTH TWICE A DAY (Patient taking differently: Take 5 mg by mouth 2 (two) times daily. ) 180 tablet 1  . fluticasone (FLONASE) 50 MCG/ACT nasal spray Place 2 sprays into both nostrils daily.    Marland Kitchen guaiFENesin (MUCINEX) 600 MG 12 hr tablet Take  600 mg by mouth 2 (two) times daily as needed (allergies).    . loratadine (CLARITIN) 10 MG tablet Take 10 mg by mouth daily as needed for allergies.    Marland Kitchen LORazepam (ATIVAN) 0.5 MG tablet TAKE 1-2 TABLETS BY MOUTH 2 TIMES DAILY AS NEEDED FOR ANXIETY. AVOID REGULAR USE 24 tablet 0  . magnesium hydroxide (MILK OF MAGNESIA) 400 MG/5ML suspension Take 15 mLs by mouth daily as needed for mild constipation.    . Multiple Vitamin (MULTIVITAMIN) tablet Take 1 tablet by mouth daily.    . Probiotic Product (ALIGN) 4 MG CAPS Take 1 capsule by mouth daily.    . RESTASIS 0.05 % ophthalmic emulsion Place 1 drop into both eyes 2 (two) times daily.  3   No current facility-administered  medications for this visit.    Allergies  Allergen Reactions  . Alendronate Sodium     upset stomach  . Penicillins     Has patient had a PCN reaction causing immediate rash, facial/tongue/throat swelling, SOB or lightheadedness with hypotension: Yes Has patient had a PCN reaction causing severe rash involving mucus membranes or skin necrosis: No Has patient had a PCN reaction that required hospitalization: No Has patient had a PCN reaction occurring within the last 10 years: No If all of the above answers are "NO", then may proceed with Cephalosporin use.   Marland Kitchen Amoxicillin Rash    Has patient had a PCN reaction causing immediate rash, facial/tongue/throat swelling, SOB or lightheadedness with hypotension: yes Has patient had a PCN reaction causing severe rash involving mucus membranes or skin necrosis: no Has patient had a PCN reaction that required hospitalization: no Has patient had PCN reaction within the last 10 years: no  If all of the above answers are "NO", then may proceed with Cephalosporin use.  REACTION: unspecified  . Metronidazole Other (See Comments)    Pounding headaches patient says was bad.  With cipro for diverticulitis rx. Other reaction(s): Other (See Comments) Pounding headaches patient says was bad.With cipro for diverticulitis rx.    Social History   Socioeconomic History  . Marital status: Widowed    Spouse name: Not on file  . Number of children: Not on file  . Years of education: Not on file  . Highest education level: Not on file  Occupational History  . Occupation: retired  Tobacco Use  . Smoking status: Never Smoker  . Smokeless tobacco: Never Used  Substance and Sexual Activity  . Alcohol use: Yes    Alcohol/week: 0.0 standard drinks    Comment: occ  . Drug use: No  . Sexual activity: Not on file  Other Topics Concern  . Not on file  Social History Narrative   Retired   Regular exercise- yes   Widowed   At home with children and GKs     HH of 2     Puppy   Plays cards is social and active    Social Determinants of Health   Financial Resource Strain:   . Difficulty of Paying Living Expenses:   Food Insecurity:   . Worried About Charity fundraiser in the Last Year:   . Arboriculturist in the Last Year:   Transportation Needs:   . Film/video editor (Medical):   Marland Kitchen Lack of Transportation (Non-Medical):   Physical Activity:   . Days of Exercise per Week:   . Minutes of Exercise per Session:   Stress:   . Feeling of Stress :  Social Connections:   . Frequency of Communication with Friends and Family:   . Frequency of Social Gatherings with Friends and Family:   . Attends Religious Services:   . Active Member of Clubs or Organizations:   . Attends Archivist Meetings:   Marland Kitchen Marital Status:   Intimate Partner Violence:   . Fear of Current or Ex-Partner:   . Emotionally Abused:   Marland Kitchen Physically Abused:   . Sexually Abused:     Family History  Problem Relation Age of Onset  . Other Mother        fx hip  . Other Sister        fx hip  . Bladder Cancer Brother   . Colon cancer Neg Hx   . Esophageal cancer Neg Hx   . Stomach cancer Neg Hx   . Rectal cancer Neg Hx   . Liver cancer Neg Hx     Review of Systems:  As stated in the HPI and otherwise negative.   BP 118/68   Pulse 72   Ht 4' 11"  (1.499 m)   Wt 159 lb (72.1 kg)   SpO2 98%   BMI 32.11 kg/m   Physical Examination:  General: Well developed, well nourished, NAD  HEENT: OP clear, mucus membranes moist  SKIN: warm, dry. No rashes. Neuro: No focal deficits  Musculoskeletal: Muscle strength 5/5 all ext  Psychiatric: Mood and affect normal  Neck: No JVD, no carotid bruits, no thyromegaly, no lymphadenopathy.  Lungs:Clear bilaterally, no wheezes, rhonci, crackles Cardiovascular: Irregular irregular. No murmurs, gallops or rubs. Abdomen:Soft. Bowel sounds present. Non-tender.  Extremities: No lower extremity edema.   EKG:  EKG is  not ordered today. The ekg ordered today demonstrates   Echo 06/18/19: 1. Left ventricular ejection fraction, by estimation, is 65 to 70%. The  left ventricle has normal function. The left ventricle has no regional  wall motion abnormalities. Left ventricular diastolic parameters are  indeterminate.  2. Right ventricular systolic function is normal. The right ventricular  size is normal. There is normal pulmonary artery systolic pressure.  3. Left atrial size was moderately dilated.  4. Right atrial size was severely dilated.  5. The mitral valve is normal in structure. Trivial mitral valve  regurgitation. No evidence of mitral stenosis.  6. Tricuspid valve regurgitation is mild to moderate.  7. The aortic valve is normal in structure. Aortic valve regurgitation is  not visualized. No aortic stenosis is present.  8. The inferior vena cava is normal in size with greater than 50%  respiratory variability, suggesting right atrial pressure of 3 mmHg.   Recent Labs: 04/07/2019: TSH 2.39 06/17/2019: Magnesium 1.6 08/02/2019: ALT 22; BUN 7; Creatinine, Ser 0.81; Hemoglobin 13.5; Platelets 286; Potassium 4.2; Sodium 131   Lipid Panel    Component Value Date/Time   CHOL 202 (H) 04/07/2019 1552   TRIG 203.0 (H) 04/07/2019 1552   HDL 67.30 04/07/2019 1552   CHOLHDL 3 04/07/2019 1552   VLDL 40.6 (H) 04/07/2019 1552   LDLCALC 105 (H) 04/08/2018 1019   LDLDIRECT 100.0 04/07/2019 1552     Wt Readings from Last 3 Encounters:  09/06/19 159 lb (72.1 kg)  07/15/19 162 lb 4 oz (73.6 kg)  07/14/19 161 lb 6.4 oz (73.2 kg)     Other studies Reviewed: Additional studies/ records that were reviewed today include: . Review of the above records demonstrates:    Assessment and Plan:   1. Paroxysmal atrial fibrillation: She is in atrial fib  today. She has no palpitations. Will continue Cardizem and Eliquis.     2. Mitral regurgitation: Trivial by echo in March 2021.    Current medicines are  reviewed at length with the patient today.  The patient does not have concerns regarding medicines.  The following changes have been made:  no change  Labs/ tests ordered today include:  No orders of the defined types were placed in this encounter.    Disposition:   FU with me in 12 months   Signed, Lauree Chandler, MD 09/06/2019 4:37 PM    [facility] [place], [place], [place]  [address] Phone: [phone]; Fax: [phone]

## 2019-09-06 NOTE — Patient Instructions (Signed)
Medication Instructions:  No changes *If you need a refill on your cardiac medications before your next appointment, please call your pharmacy*   Lab Work: none If you have labs (blood work) drawn today and your tests are completely normal, you will receive your results only by: Marland Kitchen MyChart Message (if you have MyChart) OR . A paper copy in the mail If you have any lab test that is abnormal or we need to change your treatment, we will call you to review the results.   Testing/Procedures: none   Follow-Up: At [facility], you and your health needs are our priority.  As part of our continuing mission to provide you with exceptional heart care, we have created designated Provider Care Teams.  These Care Teams include your primary Cardiologist (physician) and Advanced Practice Providers (APPs -  Physician Assistants and Nurse Practitioners) who all work together to provide you with the care you need, when you need it.  We recommend signing up for the patient portal called "MyChart".  Sign up information is provided on this After Visit Summary.  MyChart is used to connect with patients for Virtual Visits (Telemedicine).  Patients are able to view lab/test results, encounter notes, upcoming appointments, etc.  Non-urgent messages can be sent to your provider as well.   To learn more about what you can do with MyChart, go to [email or web address].    Your next appointment:   12 month(s)  The format for your next appointment:   In Person  Provider:   You may see Lauree Chandler, MD or one of the following Advanced Practice Providers on your designated Care Team:    Melina Copa, PA-C  Ermalinda Barrios, PA-C    Other Instructions

## 2019-09-08 ENCOUNTER — Encounter: Payer: Self-pay | Admitting: Physical Therapy

## 2019-09-08 ENCOUNTER — Ambulatory Visit: Payer: Medicare Other | Admitting: Physical Therapy

## 2019-09-08 ENCOUNTER — Other Ambulatory Visit: Payer: Self-pay

## 2019-09-08 DIAGNOSIS — R293 Abnormal posture: Secondary | ICD-10-CM

## 2019-09-08 DIAGNOSIS — M6281 Muscle weakness (generalized): Secondary | ICD-10-CM

## 2019-09-08 NOTE — Therapy (Signed)
[facility] [address] [place], [place], [address] Phone: [phone]   Fax:  [phone]  Physical Therapy Treatment  Patient Details  Name: Ashley Lawson MRN: [id number] Date of Birth: [birth year] Referring Provider (PT): Gatha Mayer, MD   Encounter Date: 09/08/2019  PT End of Session - 09/08/19 1529    Visit Number  6    Date for PT Re-Evaluation  10/28/19    PT Start Time  1529    PT Stop Time  1609    PT Time Calculation (min)  40 min    Activity Tolerance  Patient tolerated treatment well    Behavior During Therapy  [facility] for tasks assessed/performed       Past Medical History:  Diagnosis Date  . Abdominal pain 12/2009   hospitalized  . Allergic rhinitis   . Chronic constipation   . Compression fx, lumbar spine ([place])   . DDD (degenerative disc disease), lumbar   . Diverticulosis of colon   . Dog bite(E906.0) 08/30/2011  . GERD (gastroesophageal reflux disease)   . Hepatic steatosis   . Hyperlipidemia   . Hyperplastic colon polyp   . IBS (irritable bowel syndrome)    constipation predominant  . Internal hemorrhoids   . Osteoporosis    dexa 2011 -2.7 hip nl spine  . PAF (paroxysmal atrial fibrillation) ([place])    a. dx 05/2017.  Marland Kitchen PMR (polymyalgia rheumatica) (HCC) 08/30/2011   under rheum care  on low dose pred 5 mg   . Renal disorder     Past Surgical History:  Procedure Laterality Date  . ABDOMINAL HYSTERECTOMY     with bladder tact and rectocele repair  . CATARACT EXTRACTION  2010   both  . COLONOSCOPY  2016 was most recent    There were no vitals filed for this visit.  Subjective Assessment - 09/08/19 1532    Subjective  Overall less pain    Patient Stated Goals  get rid of the pain and be able to walk                        [facility] Adult PT Treatment/Exercise - 09/08/19 0001      Neuro Re-ed    Neuro Re-ed Details   breathing and bulging; tactile cues to pelvic  floor for relax with breathing techniques      Lumbar Exercises: Seated   Other Seated Lumbar Exercises  thoracic extension with ball behind back    Other Seated Lumbar Exercises  pec stretch - educated and perform in sitting      Manual Therapy   Manual Therapy  Internal Pelvic Floor    Manual therapy comments  identity confirmed and informed consent given to perform internal STM    Internal Pelvic Floor  levators, anal sphincters and puborectalis               PT Short Term Goals - 08/06/19 1100      PT SHORT TERM GOAL #1   Title  25% less pain    Time  6    Period  Weeks    Status  New    Target Date  09/16/19      PT SHORT TERM GOAL #2   Title  ind with toileting techniques    Time  6    Period  Weeks    Status  New    Target Date  09/16/19  PT Long Term Goals - 09/01/19 1421      PT LONG TERM GOAL #2   Title  Pt will report she is able to empty bladder and feel no sensation of urinary retention.    Baseline  currently can empty most of the time    Status  On-going      PT LONG TERM GOAL #3   Title  Pt will be ind with advanced HEP    Status  On-going      PT LONG TERM GOAL #4   Title  Pt will improved pelvic strength to 3/5 and able to hold at least 10 seconds for improved trunk stability    Status  On-going            Plan - 09/08/19 1719    Clinical Impression Statement  Pt had some difficutly relaxing puborectalis muscle and some tension on Lt pelvic levators.  Pt needed a lot of cues to breathe and relax and cues to bulge pelvic floor muscles.  Pt had falling of bladder with bearing down as well.  Pt states she was pushing a lot when feeling like she had to have a BM and was educated on better toileting habits and wait until she is more ready to have a BM. Overall she states her pain is much less.    PT Treatment/Interventions  ADLs/Self Care Home Management;Biofeedback;Cryotherapy;Electrical Stimulation;Moist Heat;Therapeutic  activities;Neuromuscular re-education;Therapeutic exercise;Patient/family education;Dry needling;Passive range of motion;Manual techniques    PT Next Visit Plan  core and pelvic floor coordination, biofeedback, internal STM if needed for excursion    PT Home Exercise Plan  Access Code: [email or web address]    Consulted and Agree with Plan of Care  Patient       Patient will benefit from skilled therapeutic intervention in order to improve the following deficits and impairments:  Pain, Postural dysfunction, Increased fascial restricitons, Decreased strength, Decreased coordination, Decreased range of motion, Increased muscle spasms  Visit Diagnosis: Muscle weakness (generalized)  Abnormal posture     Problem List Patient Active Problem List   Diagnosis Date Noted  . Syncope 06/17/2019  . Seasonal allergies 06/17/2019  . DDD (degenerative disc disease), lumbar   . Proctocolitis   . CKD (chronic kidney disease), stage III   . Internal hemorrhoids   . Dizziness 09/17/2018  . Abdominal pain 09/17/2018  . AF (paroxysmal atrial fibrillation) ([place]) 09/17/2018  . Chest discomfort 05/13/2018  . Coagulation defect ([place]) 01/30/2018  . Deviated septum 01/30/2018  . Epistaxis, recurrent 01/30/2018  . Atrial fibrillation with RVR ([place]) 05/02/2017  . Rhinitis, allergic 05/31/2015  . Renal cyst incidental  01/19/2015  . Heartburn 01/14/2014  . GERD (gastroesophageal reflux disease) 01/14/2014  . Osteoporosis 01/14/2014  . IBS (irritable bowel syndrome)   . Nail, ingrown 06/22/2012  . Arthritis 02/19/2012  . Screening, lipid 02/18/2011  . Rash and nonspecific skin eruption 02/18/2011  . Visit for preventive health examination 02/18/2011  . Skin cancer 10/14/2010  . Abdominal bloating 10/08/2010  . Chronic constipation 01/15/2010  . Lower urinary tract symptoms (LUTS) 01/15/2010  . SUPRAPUBIC PAIN 08/01/2009  . ABDOMINAL PAIN, EPIGASTRIC 06/16/2009  . DIVERTICULOSIS, COLON 06/07/2007  .  Hyperlipidemia 06/05/2007  . ANXIETY STATE, UNSPECIFIED 06/05/2007  . Allergic rhinitis 06/05/2007  . IBS 06/05/2007  . OSTEOPOROSIS 06/05/2007  . ABDOMINAL PAIN, GENERALIZED 06/05/2007    Jule Ser, PT 09/08/2019, 5:33 PM  [facility] [address], [place], [place], [address] Phone: [phone]  Fax:  [phone]  Name: Ashley Lawson MRN: [id number] Date of Birth: 17-Jul-1932

## 2019-09-10 ENCOUNTER — Ambulatory Visit: Payer: Medicare Other | Admitting: Physical Therapy

## 2019-09-13 ENCOUNTER — Other Ambulatory Visit: Payer: Self-pay

## 2019-09-13 ENCOUNTER — Ambulatory Visit: Payer: Medicare Other | Admitting: Physical Therapy

## 2019-09-13 DIAGNOSIS — M6281 Muscle weakness (generalized): Secondary | ICD-10-CM

## 2019-09-13 DIAGNOSIS — R293 Abnormal posture: Secondary | ICD-10-CM

## 2019-09-13 NOTE — Therapy (Signed)
[facility] [address], [place], [place], [address] Phone: [phone]   Fax:  [phone]  Physical Therapy Treatment  Patient Details  Name: Ashley Lawson MRN: [id number] Date of Birth: August 31, 1932 Referring Provider (PT): Gatha Mayer, MD   Encounter Date: 09/13/2019   PT End of Session - 09/13/19 1617    Visit Number 7    Date for PT Re-Evaluation 10/28/19    PT Start Time 1534    PT Stop Time 1610    PT Time Calculation (min) 36 min    Activity Tolerance Patient tolerated treatment well    Behavior During Therapy [facility] for tasks assessed/performed           Past Medical History:  Diagnosis Date  . Abdominal pain 12/2009   hospitalized  . Allergic rhinitis   . Chronic constipation   . Compression fx, lumbar spine ([place])   . DDD (degenerative disc disease), lumbar   . Diverticulosis of colon   . Dog bite(E906.0) 08/30/2011  . GERD (gastroesophageal reflux disease)   . Hepatic steatosis   . Hyperlipidemia   . Hyperplastic colon polyp   . IBS (irritable bowel syndrome)    constipation predominant  . Internal hemorrhoids   . Osteoporosis    dexa 2011 -2.7 hip nl spine  . PAF (paroxysmal atrial fibrillation) ([place])    a. dx 05/2017.  Marland Kitchen PMR (polymyalgia rheumatica) (HCC) 08/30/2011   under rheum care  on low dose pred 5 mg   . Renal disorder     Past Surgical History:  Procedure Laterality Date  . ABDOMINAL HYSTERECTOMY     with bladder tact and rectocele repair  . CATARACT EXTRACTION  2010   both  . COLONOSCOPY  2016 was most recent    There were no vitals filed for this visit.   Subjective Assessment - 09/13/19 1539    Subjective Pt states lately the BM have seemed easier and pain is less, but having more frequency and not able to empty    Patient Stated Goals get rid of the pain and be able to walk    Currently in Pain? No/denies                             [facility] Adult  PT Treatment/Exercise - 09/13/19 0001      Lumbar Exercises: Supine   Bridge 10 reps;3 seconds      Manual Therapy   Myofascial Release Lt abdomen and bladder                  PT Education - 09/13/19 1612    Education Details double void and lying down and breathing    Person(s) Educated Patient    Methods Explanation;Demonstration;Verbal cues;Handout    Comprehension Verbalized understanding;Returned demonstration            PT Short Term Goals - 08/06/19 1100      PT SHORT TERM GOAL #1   Title 25% less pain    Time 6    Period Weeks    Status New    Target Date 09/16/19      PT SHORT TERM GOAL #2   Title ind with toileting techniques    Time 6    Period Weeks    Status New    Target Date 09/16/19             PT Long Term Goals -  09/01/19 1421      PT LONG TERM GOAL #2   Title Pt will report she is able to empty bladder and feel no sensation of urinary retention.    Baseline currently can empty most of the time    Status On-going      PT LONG TERM GOAL #3   Title Pt will be ind with advanced HEP    Status On-going      PT LONG TERM GOAL #4   Title Pt will improved pelvic strength to 3/5 and able to hold at least 10 seconds for improved trunk stability    Status On-going                 Plan - 09/13/19 1614    Clinical Impression Statement Pt was bulging today when attempting to contract and lift pelvic floor.  Pt needed verbal and tactile cues and exercises done in sidelying.  She has been having more pain and difficulty emptying bladder.  Pt had good fascial release around bladder and reports feeling better after treatment.  Pt was educated in double voiding.  Pt will benefit from skilled PT to continue working on coordination and ability to void completely.    PT Treatment/Interventions ADLs/Self Care Home Management;Biofeedback;Cryotherapy;Electrical Stimulation;Moist Heat;Therapeutic activities;Neuromuscular re-education;Therapeutic  exercise;Patient/family education;Dry needling;Passive range of motion;Manual techniques    PT Next Visit Plan core and pelvic floor coordination, biofeedback, internal STM if needed for excursion    PT Home Exercise Plan Access Code: [email or web address]    Consulted and Agree with Plan of Care Patient           Patient will benefit from skilled therapeutic intervention in order to improve the following deficits and impairments:  Pain, Postural dysfunction, Increased fascial restricitons, Decreased strength, Decreased coordination, Decreased range of motion, Increased muscle spasms  Visit Diagnosis: Muscle weakness (generalized)  Abnormal posture     Problem List Patient Active Problem List   Diagnosis Date Noted  . Syncope 06/17/2019  . Seasonal allergies 06/17/2019  . DDD (degenerative disc disease), lumbar   . Proctocolitis   . CKD (chronic kidney disease), stage III   . Internal hemorrhoids   . Dizziness 09/17/2018  . Abdominal pain 09/17/2018  . AF (paroxysmal atrial fibrillation) ([place]) 09/17/2018  . Chest discomfort 05/13/2018  . Coagulation defect ([place]) 01/30/2018  . Deviated septum 01/30/2018  . Epistaxis, recurrent 01/30/2018  . Atrial fibrillation with RVR ([place]) 05/02/2017  . Rhinitis, allergic 05/31/2015  . Renal cyst incidental  01/19/2015  . Heartburn 01/14/2014  . GERD (gastroesophageal reflux disease) 01/14/2014  . Osteoporosis 01/14/2014  . IBS (irritable bowel syndrome)   . Nail, ingrown 06/22/2012  . Arthritis 02/19/2012  . Screening, lipid 02/18/2011  . Rash and nonspecific skin eruption 02/18/2011  . Visit for preventive health examination 02/18/2011  . Skin cancer 10/14/2010  . Abdominal bloating 10/08/2010  . Chronic constipation 01/15/2010  . Lower urinary tract symptoms (LUTS) 01/15/2010  . SUPRAPUBIC PAIN 08/01/2009  . ABDOMINAL PAIN, EPIGASTRIC 06/16/2009  . DIVERTICULOSIS, COLON 06/07/2007  . Hyperlipidemia 06/05/2007  . ANXIETY STATE,  UNSPECIFIED 06/05/2007  . Allergic rhinitis 06/05/2007  . IBS 06/05/2007  . OSTEOPOROSIS 06/05/2007  . ABDOMINAL PAIN, GENERALIZED 06/05/2007    Jule Ser, PT 09/13/2019, 5:11 PM  [facility] [address] [place], [place], [address] Phone: [phone]   Fax:  [phone]  Name: Ashley Lawson MRN: [id number] Date of Birth: Nov 11, 1932

## 2019-09-13 NOTE — Patient Instructions (Addendum)
Double Voiding can be a very useful technique to help overcome incomplete emptying of your bladder.  Incomplete emptying of urine can result in leakage after using the bathroom and increase the risk of urinary tract infection.   Initial Void: When you first sit down to urinate, ensure optimal positioning for bladder emptying by following these guidelines for toileting posture: Sit on the toilet seat - dont hover over the seat Support your trunk by placing your hands on your knees or thighs Spread your knees and hips wide Position your feet flat on the floor or elevate feet on phone books, foot stool (Squatty Potty), or wrapped toilet paper rolls (if having knees above hips helps you empty) Lean forward from your hips Maintain the normal inward curve in your lower back   Repeated Void: After your initial void is complete, follow these movement patterns and attempt going to the bathroom again. Stand up Rotate your hips as if doing hula hoop in one direction Rotate using the same action in the other direction Rock your hips and pelvis back and forwards ("pelvic tilts") Rock your hips and pelvis side to side ("tail wag") Sit back down and repeat your voiding technique This technique can be repeated as many times as you choose to help you empty your bladder more effectively.  Lie flat:  Take 5 minutes mid-day - lie down  On your back and deep breaths and relax  [facility] [address], [place], [place] [address] Phone # [phone] Fax [phone]

## 2019-09-15 ENCOUNTER — Ambulatory Visit: Payer: Medicare Other | Admitting: Physical Therapy

## 2019-09-15 ENCOUNTER — Other Ambulatory Visit: Payer: Self-pay

## 2019-09-15 ENCOUNTER — Encounter: Payer: Self-pay | Admitting: Physical Therapy

## 2019-09-15 DIAGNOSIS — M6281 Muscle weakness (generalized): Secondary | ICD-10-CM

## 2019-09-15 DIAGNOSIS — R293 Abnormal posture: Secondary | ICD-10-CM

## 2019-09-15 NOTE — Therapy (Signed)
[facility] [address], [place], [place], [address] Phone: [phone]   Fax:  [phone]  Physical Therapy Treatment  Patient Details  Name: Ashley Lawson MRN: [id number] Date of Birth: [birth year] Referring Provider (PT): Gatha Mayer, MD   Encounter Date: 09/15/2019   PT End of Session - 09/15/19 1727    Visit Number 8    Date for PT Re-Evaluation 10/28/19    PT Start Time 5465    PT Stop Time 1613    PT Time Calculation (min) 39 min           Past Medical History:  Diagnosis Date  . Abdominal pain 12/2009   hospitalized  . Allergic rhinitis   . Chronic constipation   . Compression fx, lumbar spine ([place])   . DDD (degenerative disc disease), lumbar   . Diverticulosis of colon   . Dog bite(E906.0) 08/30/2011  . GERD (gastroesophageal reflux disease)   . Hepatic steatosis   . Hyperlipidemia   . Hyperplastic colon polyp   . IBS (irritable bowel syndrome)    constipation predominant  . Internal hemorrhoids   . Osteoporosis    dexa 2011 -2.7 hip nl spine  . PAF (paroxysmal atrial fibrillation) ([place])    a. dx 05/2017.  Marland Kitchen PMR (polymyalgia rheumatica) (HCC) 08/30/2011   under rheum care  on low dose pred 5 mg   . Renal disorder     Past Surgical History:  Procedure Laterality Date  . ABDOMINAL HYSTERECTOMY     with bladder tact and rectocele repair  . CATARACT EXTRACTION  2010   both  . COLONOSCOPY  2016 was most recent    There were no vitals filed for this visit.   Subjective Assessment - 09/15/19 1728    Subjective Feeling better than last time    Currently in Pain? No/denies                             [facility] Adult PT Treatment/Exercise - 09/15/19 0001      Neuro Re-ed    Neuro Re-ed Details  biofeedback (resting 2-96m) elevated with knees bent in hooklying and attempt to breathing and relax - breathing with hip stretch; contract relax with tactile cues and guided  relaxation full body      Manual Therapy   Manual therapy comments identity confirmed and informed consent given to perform internal STM    Internal Pelvic Floor levators and obdurator internus, Lt side                    PT Short Term Goals - 08/06/19 1100      PT SHORT TERM GOAL #1   Title 25% less pain    Time 6    Period Weeks    Status New    Target Date 09/16/19      PT SHORT TERM GOAL #2   Title ind with toileting techniques    Time 6    Period Weeks    Status New    Target Date 09/16/19             PT Long Term Goals - 09/01/19 1421      PT LONG TERM GOAL #2   Title Pt will report she is able to empty bladder and feel no sensation of urinary retention.    Baseline currently can empty most of the time    Status  On-going      PT LONG TERM GOAL #3   Title Pt will be ind with advanced HEP    Status On-going      PT LONG TERM GOAL #4   Title Pt will improved pelvic strength to 3/5 and able to hold at least 10 seconds for improved trunk stability    Status On-going                 Plan - 09/15/19 1722    Clinical Impression Statement Pt responded well toSTM to work out tension in [employer] and levators.  pt continues to need some cues for breathing and relaxing but improved after guided relaxation.  Pt states she felt less pain with pressure after manual techniques today.  Pt still recommended to continue skilled PT to work on muscle coordination for bulging pelvic floor correctly and to ensure correct techniques used for emptying bladder.    Comorbidities groin and abdominal pain; chronic constipation, IBS, diverticulitis, hx of bladder tacking and hysterectomy    PT Treatment/Interventions ADLs/Self Care Home Management;Biofeedback;Cryotherapy;Electrical Stimulation;Moist Heat;Therapeutic activities;Neuromuscular re-education;Therapeutic exercise;Patient/family education;Dry needling;Passive range of motion;Manual techniques    PT Next Visit Plan  core strengthening, bulging, review using pelvic release wand    PT Home Exercise Plan Access Code: [email or web address]    Consulted and Agree with Plan of Care Patient           Patient will benefit from skilled therapeutic intervention in order to improve the following deficits and impairments:  Pain, Postural dysfunction, Increased fascial restricitons, Decreased strength, Decreased coordination, Decreased range of motion, Increased muscle spasms  Visit Diagnosis: Muscle weakness (generalized)  Abnormal posture     Problem List Patient Active Problem List   Diagnosis Date Noted  . Syncope 06/17/2019  . Seasonal allergies 06/17/2019  . DDD (degenerative disc disease), lumbar   . Proctocolitis   . CKD (chronic kidney disease), stage III   . Internal hemorrhoids   . Dizziness 09/17/2018  . Abdominal pain 09/17/2018  . AF (paroxysmal atrial fibrillation) ([place]) 09/17/2018  . Chest discomfort 05/13/2018  . Coagulation defect ([place]) 01/30/2018  . Deviated septum 01/30/2018  . Epistaxis, recurrent 01/30/2018  . Atrial fibrillation with RVR ([place]) 05/02/2017  . Rhinitis, allergic 05/31/2015  . Renal cyst incidental  01/19/2015  . Heartburn 01/14/2014  . GERD (gastroesophageal reflux disease) 01/14/2014  . Osteoporosis 01/14/2014  . IBS (irritable bowel syndrome)   . Nail, ingrown 06/22/2012  . Arthritis 02/19/2012  . Screening, lipid 02/18/2011  . Rash and nonspecific skin eruption 02/18/2011  . Visit for preventive health examination 02/18/2011  . Skin cancer 10/14/2010  . Abdominal bloating 10/08/2010  . Chronic constipation 01/15/2010  . Lower urinary tract symptoms (LUTS) 01/15/2010  . SUPRAPUBIC PAIN 08/01/2009  . ABDOMINAL PAIN, EPIGASTRIC 06/16/2009  . DIVERTICULOSIS, COLON 06/07/2007  . Hyperlipidemia 06/05/2007  . ANXIETY STATE, UNSPECIFIED 06/05/2007  . Allergic rhinitis 06/05/2007  . IBS 06/05/2007  . OSTEOPOROSIS 06/05/2007  . ABDOMINAL PAIN, GENERALIZED 06/05/2007     Jule Ser, PT 09/15/2019, 5:29 PM  [facility] [address] [place], [place], [address] Phone: [phone]   Fax:  [phone]  Name: Ashley Lawson MRN: [id number] Date of Birth: 1932/08/19

## 2019-09-20 ENCOUNTER — Other Ambulatory Visit: Payer: Self-pay

## 2019-09-20 ENCOUNTER — Ambulatory Visit: Payer: Medicare Other | Admitting: Physical Therapy

## 2019-09-20 DIAGNOSIS — M6281 Muscle weakness (generalized): Secondary | ICD-10-CM | POA: Diagnosis not present

## 2019-09-20 DIAGNOSIS — R293 Abnormal posture: Secondary | ICD-10-CM

## 2019-09-20 NOTE — Therapy (Signed)
[facility] [address], [place], [place], [address] Phone: [phone]   Fax:  [phone]  Physical Therapy Treatment  Patient Details  Name: Ashley Lawson MRN: [id number] Date of Birth: December 26, 1932 Referring Provider (PT): Gatha Mayer, MD   Encounter Date: 09/20/2019   PT End of Session - 09/20/19 1533    Visit Number 9    Date for PT Re-Evaluation 10/28/19    PT Start Time 1529    PT Stop Time 1614    PT Time Calculation (min) 45 min    Activity Tolerance Patient tolerated treatment well    Behavior During Therapy [facility] for tasks assessed/performed           Past Medical History:  Diagnosis Date  . Abdominal pain 12/2009   hospitalized  . Allergic rhinitis   . Chronic constipation   . Compression fx, lumbar spine ([place])   . DDD (degenerative disc disease), lumbar   . Diverticulosis of colon   . Dog bite(E906.0) 08/30/2011  . GERD (gastroesophageal reflux disease)   . Hepatic steatosis   . Hyperlipidemia   . Hyperplastic colon polyp   . IBS (irritable bowel syndrome)    constipation predominant  . Internal hemorrhoids   . Osteoporosis    dexa 2011 -2.7 hip nl spine  . PAF (paroxysmal atrial fibrillation) ([place])    a. dx 05/2017.  Marland Kitchen PMR (polymyalgia rheumatica) (HCC) 08/30/2011   under rheum care  on low dose pred 5 mg   . Renal disorder     Past Surgical History:  Procedure Laterality Date  . ABDOMINAL HYSTERECTOMY     with bladder tact and rectocele repair  . CATARACT EXTRACTION  2010   both  . COLONOSCOPY  2016 was most recent    There were no vitals filed for this visit.   Subjective Assessment - 09/20/19 1631    Subjective feeling better and lying down helps with emptying my bladder.  Pt having pain with increased hip flexion and a little when on nustep    Currently in Pain? No/denies                             [facility] Adult PT Treatment/Exercise - 09/20/19 0001       Lumbar Exercises: Aerobic   Nustep L1 x 4 min - PT present for status - had some goin pain with this      Lumbar Exercises: Seated   Other Seated Lumbar Exercises thoracic flexion and rotation,; thoracic and lumbar sidebending - 5 x each side 10 sec      Lumbar Exercises: Supine   Other Supine Lumbar Exercises hips elevated on pillow with breathing - felt better on abdomen      Manual Therapy   Myofascial Release Lt abdomen and bladder                    PT Short Term Goals - 09/20/19 1545      PT SHORT TERM GOAL #1   Title 25% less pain    Baseline 30-40% less    Status Achieved      PT SHORT TERM GOAL #2   Title ind with toileting techniques    Status Achieved             PT Long Term Goals - 09/01/19 1421      PT LONG TERM GOAL #2   Title Pt  will report she is able to empty bladder and feel no sensation of urinary retention.    Baseline currently can empty most of the time    Status On-going      PT LONG TERM GOAL #3   Title Pt will be ind with advanced HEP    Status On-going      PT LONG TERM GOAL #4   Title Pt will improved pelvic strength to 3/5 and able to hold at least 10 seconds for improved trunk stability    Status On-going                 Plan - 09/20/19 1618    Clinical Impression Statement Pt states she is 30-40% better with pain.  She seems to still be concerned about why the pain has not completely gone away.  She reports that lying down helps her have an improvement in her ability to empty her bladder during the day.  Pt had some tenderness with STM today but eased up and was able to tolerate more pressure on the LLQ.  Pt's abdomen feels like there is more weakness on the Lt side and she will benefit from skilled PT to continue working on impairments    PT Treatment/Interventions ADLs/Self Care Home Management;Biofeedback;Cryotherapy;Electrical Stimulation;Moist Heat;Therapeutic activities;Neuromuscular re-education;Therapeutic  exercise;Patient/family education;Dry needling;Passive range of motion;Manual techniques    PT Next Visit Plan core strengthening, bulging, review using pelvic release wand? (pt does not seem very interested in this)    PT Home Exercise Plan Access Code: [email or web address]    Consulted and Agree with Plan of Care Patient           Patient will benefit from skilled therapeutic intervention in order to improve the following deficits and impairments:  Pain, Postural dysfunction, Increased fascial restricitons, Decreased strength, Decreased coordination, Decreased range of motion, Increased muscle spasms  Visit Diagnosis: Muscle weakness (generalized)  Abnormal posture     Problem List Patient Active Problem List   Diagnosis Date Noted  . Syncope 06/17/2019  . Seasonal allergies 06/17/2019  . DDD (degenerative disc disease), lumbar   . Proctocolitis   . CKD (chronic kidney disease), stage III   . Internal hemorrhoids   . Dizziness 09/17/2018  . Abdominal pain 09/17/2018  . AF (paroxysmal atrial fibrillation) ([place]) 09/17/2018  . Chest discomfort 05/13/2018  . Coagulation defect ([place]) 01/30/2018  . Deviated septum 01/30/2018  . Epistaxis, recurrent 01/30/2018  . Atrial fibrillation with RVR ([name]) 05/02/2017  . Rhinitis, allergic 05/31/2015  . Renal cyst incidental  01/19/2015  . Heartburn 01/14/2014  . GERD (gastroesophageal reflux disease) 01/14/2014  . Osteoporosis 01/14/2014  . IBS (irritable bowel syndrome)   . Nail, ingrown 06/22/2012  . Arthritis 02/19/2012  . Screening, lipid 02/18/2011  . Rash and nonspecific skin eruption 02/18/2011  . Visit for preventive health examination 02/18/2011  . Skin cancer 10/14/2010  . Abdominal bloating 10/08/2010  . Chronic constipation 01/15/2010  . Lower urinary tract symptoms (LUTS) 01/15/2010  . SUPRAPUBIC PAIN 08/01/2009  . ABDOMINAL PAIN, EPIGASTRIC 06/16/2009  . DIVERTICULOSIS, COLON 06/07/2007  . Hyperlipidemia 06/05/2007  .  ANXIETY STATE, UNSPECIFIED 06/05/2007  . Allergic rhinitis 06/05/2007  . IBS 06/05/2007  . OSTEOPOROSIS 06/05/2007  . ABDOMINAL PAIN, GENERALIZED 06/05/2007    Jule Ser, PT 09/20/2019, 4:39 PM  [facility] [address], [place], [place], [address] Phone: [phone]   Fax:  [phone]  Name: Ashley Lawson MRN: [id number] Date of Birth:  09/24/1932   

## 2019-09-21 ENCOUNTER — Other Ambulatory Visit: Payer: Self-pay

## 2019-09-21 ENCOUNTER — Ambulatory Visit (INDEPENDENT_AMBULATORY_CARE_PROVIDER_SITE_OTHER): Payer: Medicare Other | Admitting: Internal Medicine

## 2019-09-21 ENCOUNTER — Encounter: Payer: Self-pay | Admitting: Internal Medicine

## 2019-09-21 VITALS — BP 120/68 | HR 75 | Temp 97.5°F | Ht 59.0 in | Wt 159.2 lb

## 2019-09-21 DIAGNOSIS — Z7901 Long term (current) use of anticoagulants: Secondary | ICD-10-CM

## 2019-09-21 DIAGNOSIS — R1032 Left lower quadrant pain: Secondary | ICD-10-CM

## 2019-09-21 DIAGNOSIS — R319 Hematuria, unspecified: Secondary | ICD-10-CM

## 2019-09-21 DIAGNOSIS — R233 Spontaneous ecchymoses: Secondary | ICD-10-CM | POA: Diagnosis not present

## 2019-09-21 DIAGNOSIS — R300 Dysuria: Secondary | ICD-10-CM

## 2019-09-21 DIAGNOSIS — L309 Dermatitis, unspecified: Secondary | ICD-10-CM

## 2019-09-21 DIAGNOSIS — N390 Urinary tract infection, site not specified: Secondary | ICD-10-CM

## 2019-09-21 LAB — POCT URINALYSIS DIPSTICK
Bilirubin, UA: NEGATIVE
Glucose, UA: NEGATIVE
Ketones, UA: NEGATIVE
Leukocytes, UA: NEGATIVE
Nitrite, UA: NEGATIVE
Protein, UA: POSITIVE — AB
Spec Grav, UA: 1.02 (ref 1.010–1.025)
Urobilinogen, UA: 0.2 E.U./dL
pH, UA: 6 (ref 5.0–8.0)

## 2019-09-21 MED ORDER — METRONIDAZOLE 500 MG PO TABS: 500.0000 mg | ORAL_TABLET | Freq: Three times a day (TID) | ORAL | 0 refills | Status: AC

## 2019-09-21 MED ORDER — DOXYCYCLINE MONOHYDRATE 100 MG PO TABS: 100.0000 mg | ORAL_TABLET | Freq: Two times a day (BID) | ORAL | 0 refills | Status: AC

## 2019-09-21 MED ORDER — DESONIDE 0.05 % EX CREA
TOPICAL_CREAM | Freq: Two times a day (BID) | CUTANEOUS | 1 refills | Status: DC
Start: 2019-09-21 — End: 2019-11-10

## 2019-09-21 NOTE — Patient Instructions (Addendum)
Not sure  If this is diverticulitis  But will send in 5 days of medication incase .  Also cream for  Itchy  Skin legs   .  This could still be   Scar tissue and muscle  wall pain that is not going to resolve with antibiotic.   Have a good time at the wedding .

## 2019-09-21 NOTE — Progress Notes (Signed)
Chief Complaint  Patient presents with  . Follow-up    discuss thin skin, see places on skin, check her bladder and medication    HPI: Ashley Lawson 86 y.o. come in for SDA  Had pt today having more llq groin pain worse than usual and concern that her colitis could be returnign   Really looking   Forward to wedding in [place]  This weekend  Wants to be ok  Can we rx for diverticiulitis ? No fever  Is in [place]  Therapy per dr Carlean Purl  Per abd sx and voiiding sx   Laying down to empty bladder   Mc dermiand   Given tamslusin    And passed out on commode  llq  Groin and dr Carlean Purl  Diverticulitis  Itchy patches  On lateral difficult legs.  Using otc asks for rx if possible  No fever    Grand daughters  Wedding   Hurts to ride.   ROS: See pertinent positives and negatives per HPI.  Please check urine  Incase infected   Past Medical History:  Diagnosis Date  . Abdominal pain 12/2009   hospitalized  . Allergic rhinitis   . Chronic constipation   . Compression fx, lumbar spine ([place])   . DDD (degenerative disc disease), lumbar   . Diverticulosis of colon   . Dog bite(E906.0) 08/30/2011  . GERD (gastroesophageal reflux disease)   . Hepatic steatosis   . Hyperlipidemia   . Hyperplastic colon polyp   . IBS (irritable bowel syndrome)    constipation predominant  . Internal hemorrhoids   . Osteoporosis    dexa 2011 -2.7 hip nl spine  . PAF (paroxysmal atrial fibrillation) ([place])    a. dx 05/2017.  Marland Kitchen PMR (polymyalgia rheumatica) (HCC) 08/30/2011   under rheum care  on low dose pred 5 mg   . Renal disorder     Family History  Problem Relation Age of Onset  . Other Mother        fx hip  . Other Sister        fx hip  . Bladder Cancer Brother   . Colon cancer Neg Hx   . Esophageal cancer Neg Hx   . Stomach cancer Neg Hx   . Rectal cancer Neg Hx   . Liver cancer Neg Hx     Social History   Socioeconomic History  . Marital status: Widowed    Spouse name: Not on file  . Number  of children: Not on file  . Years of education: Not on file  . Highest education level: Not on file  Occupational History  . Occupation: retired  Tobacco Use  . Smoking status: Never Smoker  . Smokeless tobacco: Never Used  Vaping Use  . Vaping Use: Never used  Substance and Sexual Activity  . Alcohol use: Yes    Alcohol/week: 0.0 standard drinks    Comment: occ  . Drug use: No  . Sexual activity: Not on file  Other Topics Concern  . Not on file  Social History Narrative   Retired   Regular exercise- yes   Widowed   At home with children and GKs    HH of 2     Puppy   Plays cards is social and active    Social Determinants of Health   Financial Resource Strain:   . Difficulty of Paying Living Expenses:   Food Insecurity:   . Worried About Charity fundraiser in the Last  Year:   . Ran Out of Food in the Last Year:   Transportation Needs:   . Film/video editor (Medical):   Marland Kitchen Lack of Transportation (Non-Medical):   Physical Activity:   . Days of Exercise per Week:   . Minutes of Exercise per Session:   Stress:   . Feeling of Stress :   Social Connections:   . Frequency of Communication with Friends and Family:   . Frequency of Social Gatherings with Friends and Family:   . Attends Religious Services:   . Active Member of Clubs or Organizations:   . Attends Archivist Meetings:   Marland Kitchen Marital Status:     Outpatient Medications Prior to Visit  Medication Sig Dispense Refill  . acetaminophen (TYLENOL) 500 MG tablet Take 500 mg by mouth every 6 (six) hours as needed for mild pain, moderate pain or headache.    . AMBULATORY NON FORMULARY MEDICATION Medication Name: Diltiazem 2% with lidocaine 5% Insert a pea size amount into rectum to the first knuckle TID 30 g 3  . Ascorbic Acid (VITAMIN C) 100 MG tablet Take 100 mg by mouth daily.      . calcium carbonate (OS-CAL) 1250 (500 Ca) MG chewable tablet Chew 1 tablet by mouth 2 (two) times daily.    Marland Kitchen  diltiazem (CARDIZEM CD) 120 MG 24 hr capsule TAKE 1 CAPSULE BY MOUTH EVERY DAY (Patient taking differently: Take 120 mg by mouth daily. ) 90 capsule 3  . ELIQUIS 5 MG TABS tablet TAKE 1 TABLET BY MOUTH TWICE A DAY (Patient taking differently: Take 5 mg by mouth 2 (two) times daily. ) 180 tablet 1  . guaiFENesin (MUCINEX) 600 MG 12 hr tablet Take 600 mg by mouth 2 (two) times daily as needed (allergies).    . loratadine (CLARITIN) 10 MG tablet Take 10 mg by mouth daily as needed for allergies.    Marland Kitchen LORazepam (ATIVAN) 0.5 MG tablet TAKE 1-2 TABLETS BY MOUTH 2 TIMES DAILY AS NEEDED FOR ANXIETY. AVOID REGULAR USE 24 tablet 0  . magnesium hydroxide (MILK OF MAGNESIA) 400 MG/5ML suspension Take 15 mLs by mouth daily as needed for mild constipation.    . Multiple Vitamin (MULTIVITAMIN) tablet Take 1 tablet by mouth daily.    Marland Kitchen PREMARIN vaginal cream INSERT 1 GRAM VAGINALLY THREE TIMES A WEEKLY, THEN ONCE WEEKLY    . Probiotic Product (ALIGN) 4 MG CAPS Take 1 capsule by mouth daily.    . RESTASIS 0.05 % ophthalmic emulsion Place 1 drop into both eyes 2 (two) times daily.  3  . triamcinolone (NASACORT) 55 MCG/ACT AERO nasal inhaler SMARTSIG:2 Spray(s) Both Nares Every Night    . fluticasone (FLONASE) 50 MCG/ACT nasal spray Place 2 sprays into both nostrils daily. (Patient not taking: Reported on 09/21/2019)     No facility-administered medications prior to visit.     EXAM:  BP 120/68   Pulse 75   Temp (!) 97.5 F (36.4 C) (Temporal)   Ht 4' 11"  (1.499 m)   Wt 159 lb 3.2 oz (72.2 kg)   SpO2 94%   BMI 32.15 kg/m   Body mass index is 32.15 kg/m.  GENERAL: vitals reviewed and listed above, alert, oriented, appears well hydrated and in no acute distress HEENT: atraumatic, conjunctiva  clear, no obvious abnormalities on inspection of external nose and ears OP : masked  NECK: no obvious masses on inspection palpation  LUNGS: clear to auscultation bilaterally, no wheezes, rales or rhonchi, good air  movement CV: HRRR, no clubbing cyanosis or  peripheral edema nl cap refill  abd  Tender llq no masses  No rebound no flank pain  MS: moves all extremities without noticeable focal  Abnormality some djd  PSYCH: pleasant and cooperative, no obvious  Skin  Sun changes  And excoriated area later right  Distal leg   senile ecchymosis  Left forearm and lft shin skin intact  Lab Results  Component Value Date   WBC 7.0 08/02/2019   HGB 13.5 08/02/2019   HCT 39.3 08/02/2019   PLT 286 08/02/2019   GLUCOSE 96 08/02/2019   CHOL 202 (H) 04/07/2019   TRIG 203.0 (H) 04/07/2019   HDL 67.30 04/07/2019   LDLDIRECT 100.0 04/07/2019   LDLCALC 105 (H) 04/08/2018   ALT 22 08/02/2019   AST 28 08/02/2019   NA 131 (L) 08/02/2019   K 4.2 08/02/2019   CL 98 08/02/2019   CREATININE 0.81 08/02/2019   BUN 7 (L) 08/02/2019   CO2 23 08/02/2019   TSH 2.39 04/07/2019   BP Readings from Last 3 Encounters:  09/21/19 120/68  09/06/19 118/68  08/03/19 129/66    ASSESSMENT AND PLAN:  Discussed the following assessment and plan:  Abdominal pain, LLQ - inc past baseline?   Senile ecchymosis  Dysuria - Plan: POCT urinalysis dipstick, Urine Culture  Anticoagulant long-term use  Urinary tract infection with hematuria, rule out  - Plan: Urine Culture  Dermatitis  Risk benefit of medication discussed. Hesitant about antibiotic but she has had  Flare with hospitalization about  3-4  Months ago .  Uncertain if flare is from this  She is desparate to be ok for the wedding  And asks for rx .  Will rx doxycycline and  Metronidazole for 5 days ( can take this  Despite  Intolerance list) and explained risk .   -Patient advised to return or notify health care team  if  new concerns arise.  Patient Instructions  Not sure  If this is diverticulitis  But will send in 5 days of medication incase .  Also cream for  Itchy  Skin legs   .  This could still be   Scar tissue and muscle  wall pain that is not going to  resolve with antibiotic.   Have a good time at the wedding .  Standley Brooking. Micaella Gitto M.D.

## 2019-09-22 ENCOUNTER — Telehealth: Payer: Self-pay

## 2019-09-22 ENCOUNTER — Ambulatory Visit: Payer: Medicare Other | Admitting: Physical Therapy

## 2019-09-22 DIAGNOSIS — M6281 Muscle weakness (generalized): Secondary | ICD-10-CM | POA: Diagnosis not present

## 2019-09-22 DIAGNOSIS — R293 Abnormal posture: Secondary | ICD-10-CM

## 2019-09-22 LAB — URINE CULTURE
MICRO NUMBER:: 10619900
SPECIMEN QUALITY:: ADEQUATE

## 2019-09-22 NOTE — Telephone Encounter (Signed)
Called patient and gave her the message from Dr. Regis Bill. Patient verbalized an understanding and will let us know if anything changes.

## 2019-09-22 NOTE — Telephone Encounter (Signed)
Patient called and stated that she is not able to take the Metronidazole (Flagyl) as it is on her allergy list and causes severe headaches and she is asking if there is something else you can send in for her?

## 2019-09-22 NOTE — Therapy (Signed)
[facility] [address] [place], [place], [address] Phone: [phone]   Fax:  [phone]  Physical Therapy Treatment Progress Note Reporting Period 08/05/19 to 09/22/19   See note below for Objective Data and Assessment of Progress/Goals.      Patient Details  Name: Ashley Lawson MRN: [id number] Date of Birth: Aug 01, 1932 Referring Provider (PT): Gatha Mayer, MD   Encounter Date: 09/22/2019   PT End of Session - 09/22/19 1605    Visit Number 10    Date for PT Re-Evaluation 10/28/19    PT Start Time 1522    PT Stop Time 1603    PT Time Calculation (min) 41 min    Activity Tolerance Patient tolerated treatment well    Behavior During Therapy [facility] for tasks assessed/performed           Past Medical History:  Diagnosis Date  . Abdominal pain 12/2009   hospitalized  . Allergic rhinitis   . Chronic constipation   . Compression fx, lumbar spine ([place])   . DDD (degenerative disc disease), lumbar   . Diverticulosis of colon   . Dog bite(E906.0) 08/30/2011  . GERD (gastroesophageal reflux disease)   . Hepatic steatosis   . Hyperlipidemia   . Hyperplastic colon polyp   . IBS (irritable bowel syndrome)    constipation predominant  . Internal hemorrhoids   . Osteoporosis    dexa 2011 -2.7 hip nl spine  . PAF (paroxysmal atrial fibrillation) ([place])    a. dx 05/2017.  Marland Kitchen PMR (polymyalgia rheumatica) (HCC) 08/30/2011   under rheum care  on low dose pred 5 mg   . Renal disorder     Past Surgical History:  Procedure Laterality Date  . ABDOMINAL HYSTERECTOMY     with bladder tact and rectocele repair  . CATARACT EXTRACTION  2010   both  . COLONOSCOPY  2016 was most recent    There were no vitals filed for this visit.   Subjective Assessment - 09/22/19 1617    Subjective I couldn't go to the bathroom, I just tried a couple of times    Currently in Pain? No/denies                              [facility] Adult PT Treatment/Exercise - 09/22/19 0001      Neuro Re-ed    Neuro Re-ed Details  tactile cues to urethra sphincters and bulbocavernosis; cues to exhale on extertion      Lumbar Exercises: Supine   Bridge 10 reps      Manual Therapy   Myofascial Release Lt abdomen    Internal Pelvic Floor levators and obdurator internus, Lt side                    PT Short Term Goals - 09/20/19 1545      PT SHORT TERM GOAL #1   Title 25% less pain    Baseline 30-40% less    Status Achieved      PT SHORT TERM GOAL #2   Title ind with toileting techniques    Status Achieved             PT Long Term Goals - 09/01/19 1421      PT LONG TERM GOAL #2   Title Pt will report she is able to empty bladder and feel no sensation of urinary retention.    Baseline currently  can empty most of the time    Status On-going      PT LONG TERM GOAL #3   Title Pt will be ind with advanced HEP    Status On-going      PT LONG TERM GOAL #4   Title Pt will improved pelvic strength to 3/5 and able to hold at least 10 seconds for improved trunk stability    Status On-going                 Plan - 09/22/19 1610    Clinical Impression Statement Pt was having trouble emptying her bladder prior to today's session.  STM internal and bridges were performed.  PT was able to palpate the bladder shifting upwards.  Pt was then able to empty her bladder post session.  She was educated again to try the pelvic release wand but patient would like to see urologist before using that.  Pt was given bridges with exhale on exertion today.  She will benefit from skilled PT to continue working on strength and coordination.    Comorbidities groin and abdominal pain; chronic constipation, IBS, diverticulitis, hx of bladder tacking and hysterectomy    PT Treatment/Interventions ADLs/Self Care Home Management;Biofeedback;Cryotherapy;Electrical Stimulation;Moist  Heat;Therapeutic activities;Neuromuscular re-education;Therapeutic exercise;Patient/family education;Dry needling;Passive range of motion;Manual techniques    PT Next Visit Plan pelvic floor and core strength    PT Home Exercise Plan Access Code: [email or web address]    Consulted and Agree with Plan of Care Patient           Patient will benefit from skilled therapeutic intervention in order to improve the following deficits and impairments:  Pain, Postural dysfunction, Increased fascial restricitons, Decreased strength, Decreased coordination, Decreased range of motion, Increased muscle spasms  Visit Diagnosis: Muscle weakness (generalized)  Abnormal posture     Problem List Patient Active Problem List   Diagnosis Date Noted  . Syncope 06/17/2019  . Seasonal allergies 06/17/2019  . DDD (degenerative disc disease), lumbar   . Proctocolitis   . CKD (chronic kidney disease), stage III   . Internal hemorrhoids   . Dizziness 09/17/2018  . Abdominal pain 09/17/2018  . AF (paroxysmal atrial fibrillation) ([place]) 09/17/2018  . Chest discomfort 05/13/2018  . Coagulation defect ([place]) 01/30/2018  . Deviated septum 01/30/2018  . Epistaxis, recurrent 01/30/2018  . Atrial fibrillation with RVR ([place]) 05/02/2017  . Rhinitis, allergic 05/31/2015  . Renal cyst incidental  01/19/2015  . Heartburn 01/14/2014  . GERD (gastroesophageal reflux disease) 01/14/2014  . Osteoporosis 01/14/2014  . IBS (irritable bowel syndrome)   . Nail, ingrown 06/22/2012  . Arthritis 02/19/2012  . Screening, lipid 02/18/2011  . Rash and nonspecific skin eruption 02/18/2011  . Visit for preventive health examination 02/18/2011  . Skin cancer 10/14/2010  . Abdominal bloating 10/08/2010  . Chronic constipation 01/15/2010  . Lower urinary tract symptoms (LUTS) 01/15/2010  . SUPRAPUBIC PAIN 08/01/2009  . ABDOMINAL PAIN, EPIGASTRIC 06/16/2009  . DIVERTICULOSIS, COLON 06/07/2007  . Hyperlipidemia 06/05/2007  . ANXIETY  STATE, UNSPECIFIED 06/05/2007  . Allergic rhinitis 06/05/2007  . IBS 06/05/2007  . OSTEOPOROSIS 06/05/2007  . ABDOMINAL PAIN, GENERALIZED 06/05/2007    Jule Ser, PT 09/22/2019, 5:14 PM  [facility] [address], [place], [place], [address] Phone: [phone]   Fax:  [phone]  Name: Ashley Lawson MRN: [id number] Date of Birth: February 14, 1933

## 2019-09-22 NOTE — Telephone Encounter (Signed)
No  approprates substitue at this time     I reveiwed the med effects list  At the visit  But maybe didn't understand   that she had this medication presecrete recently and apparently?   did ok from what I could tell .  but I agree if concern dont want to take it   dont want to take a chance.   Take the doxycycline  Alone and see how we do

## 2019-09-23 NOTE — Progress Notes (Signed)
No predominantly  bacteria in urine  that would indicate uti ( just probably eternal skin bacteria of no improtance )  No UTI  documented

## 2019-09-24 ENCOUNTER — Telehealth: Payer: Self-pay | Admitting: Internal Medicine

## 2019-09-24 NOTE — Telephone Encounter (Signed)
Please advise 

## 2019-09-24 NOTE — Telephone Encounter (Signed)
Could make slightly sleepy  Not sure  . Just dont drive  Could try 1/2 tab if needed and repeat  If needed and no alcohol with this

## 2019-09-24 NOTE — Telephone Encounter (Signed)
Called patient and gave her the message from Dr. Regis Bill. Patient verbalized an understanding.

## 2019-09-24 NOTE — Telephone Encounter (Signed)
Pt wants Dr. Regis Bill to let her know if she takes her Lorezepam in the morning before her daughter's wedding if she thinks it will make her sleepy?  She has to ride to [facility] for the wedding and it hurts her bladder and abdomen.

## 2019-09-27 ENCOUNTER — Ambulatory Visit: Payer: Medicare Other | Admitting: Physical Therapy

## 2019-09-27 ENCOUNTER — Encounter: Payer: Self-pay | Admitting: Physical Therapy

## 2019-09-27 ENCOUNTER — Other Ambulatory Visit: Payer: Self-pay

## 2019-09-27 DIAGNOSIS — R293 Abnormal posture: Secondary | ICD-10-CM

## 2019-09-27 DIAGNOSIS — M6281 Muscle weakness (generalized): Secondary | ICD-10-CM

## 2019-09-27 NOTE — Therapy (Signed)
[facility] [address] [place], [place], [address] Phone: [phone]   Fax:  [phone]  Physical Therapy Treatment  Patient Details  Name: Ashley Lawson MRN: [id number] Date of Birth: 11/20/1932 Referring Provider (PT): Gatha Mayer, MD   Encounter Date: 09/27/2019   PT End of Session - 09/27/19 1553    Visit Number 11    Date for PT Re-Evaluation 10/28/19    PT Start Time 1532    PT Stop Time 1610    PT Time Calculation (min) 38 min    Activity Tolerance Patient tolerated treatment well    Behavior During Therapy [facility] for tasks assessed/performed           Past Medical History:  Diagnosis Date  . Abdominal pain 12/2009   hospitalized  . Allergic rhinitis   . Chronic constipation   . Compression fx, lumbar spine ([place])   . DDD (degenerative disc disease), lumbar   . Diverticulosis of colon   . Dog bite(E906.0) 08/30/2011  . GERD (gastroesophageal reflux disease)   . Hepatic steatosis   . Hyperlipidemia   . Hyperplastic colon polyp   . IBS (irritable bowel syndrome)    constipation predominant  . Internal hemorrhoids   . Osteoporosis    dexa 2011 -2.7 hip nl spine  . PAF (paroxysmal atrial fibrillation) ([place])    a. dx 05/2017.  Marland Kitchen PMR (polymyalgia rheumatica) (HCC) 08/30/2011   under rheum care  on low dose pred 5 mg   . Renal disorder     Past Surgical History:  Procedure Laterality Date  . ABDOMINAL HYSTERECTOMY     with bladder tact and rectocele repair  . CATARACT EXTRACTION  2010   both  . COLONOSCOPY  2016 was most recent    There were no vitals filed for this visit.   Subjective Assessment - 09/27/19 1551    Subjective Pt states she made it through the wedding.  Things have felt good since last visit.  Pt reports she had no pain over the weekend and was able to use the bathroom.    Patient Stated Goals get rid of the pain and be able to walk    Currently in Pain? No/denies                              [facility] Adult PT Treatment/Exercise - 09/27/19 0001      Lumbar Exercises: Aerobic   Nustep L2 x 8 min - PT present for status - had some goin pain with this      Lumbar Exercises: Seated   Sit to Stand 10 reps    Sit to Stand Limitations blow as you go    Other Seated Lumbar Exercises pelvic floor contract with ball squeeze and LE kick - 20x      Lumbar Exercises: Supine   Bridge 10 reps    Bridge Limitations tactile cues to squeeze and lift      Manual Therapy   Manual therapy comments identity confirmed and informed consent given to perform internal STM    Myofascial Release Lt abdomen    Internal Pelvic Floor levators and obdurator internus, Lt side                    PT Short Term Goals - 09/20/19 1545      PT SHORT TERM GOAL #1   Title 25% less pain  Baseline 30-40% less    Status Achieved      PT SHORT TERM GOAL #2   Title ind with toileting techniques    Status Achieved             PT Long Term Goals - 09/01/19 1421      PT LONG TERM GOAL #2   Title Pt will report she is able to empty bladder and feel no sensation of urinary retention.    Baseline currently can empty most of the time    Status On-going      PT LONG TERM GOAL #3   Title Pt will be ind with advanced HEP    Status On-going      PT LONG TERM GOAL #4   Title Pt will improved pelvic strength to 3/5 and able to hold at least 10 seconds for improved trunk stability    Status On-going                 Plan - 09/27/19 1718    Clinical Impression Statement Pt was doing better with improved symptoms over the weekend.  Pt has pelvic floor weakness Lt >Rt . Pt began more focus on exercises today for strengthening pelvic floor.  She was educated on blow as you go sit to stand.  Pt will benefit from skilled PT to progress core and pelvic floor strength    PT Treatment/Interventions ADLs/Self Care Home  Management;Biofeedback;Cryotherapy;Electrical Stimulation;Moist Heat;Therapeutic activities;Neuromuscular re-education;Therapeutic exercise;Patient/family education;Dry needling;Passive range of motion;Manual techniques    PT Next Visit Plan pelvic floor and core strength    PT Home Exercise Plan Access Code: [email or web address]    Consulted and Agree with Plan of Care Patient           Patient will benefit from skilled therapeutic intervention in order to improve the following deficits and impairments:  Pain, Postural dysfunction, Increased fascial restricitons, Decreased strength, Decreased coordination, Decreased range of motion, Increased muscle spasms  Visit Diagnosis: Muscle weakness (generalized)  Abnormal posture     Problem List Patient Active Problem List   Diagnosis Date Noted  . Syncope 06/17/2019  . Seasonal allergies 06/17/2019  . DDD (degenerative disc disease), lumbar   . Proctocolitis   . CKD (chronic kidney disease), stage III   . Internal hemorrhoids   . Dizziness 09/17/2018  . Abdominal pain 09/17/2018  . AF (paroxysmal atrial fibrillation) ([place]) 09/17/2018  . Chest discomfort 05/13/2018  . Coagulation defect ([place]) 01/30/2018  . Deviated septum 01/30/2018  . Epistaxis, recurrent 01/30/2018  . Atrial fibrillation with RVR ([place]) 05/02/2017  . Rhinitis, allergic 05/31/2015  . Renal cyst incidental  01/19/2015  . Heartburn 01/14/2014  . GERD (gastroesophageal reflux disease) 01/14/2014  . Osteoporosis 01/14/2014  . IBS (irritable bowel syndrome)   . Nail, ingrown 06/22/2012  . Arthritis 02/19/2012  . Screening, lipid 02/18/2011  . Rash and nonspecific skin eruption 02/18/2011  . Visit for preventive health examination 02/18/2011  . Skin cancer 10/14/2010  . Abdominal bloating 10/08/2010  . Chronic constipation 01/15/2010  . Lower urinary tract symptoms (LUTS) 01/15/2010  . SUPRAPUBIC PAIN 08/01/2009  . ABDOMINAL PAIN, EPIGASTRIC 06/16/2009  . DIVERTICULOSIS,  COLON 06/07/2007  . Hyperlipidemia 06/05/2007  . ANXIETY STATE, UNSPECIFIED 06/05/2007  . Allergic rhinitis 06/05/2007  . IBS 06/05/2007  . OSTEOPOROSIS 06/05/2007  . ABDOMINAL PAIN, GENERALIZED 06/05/2007    Jule Ser, PT 09/27/2019, 5:23 PM  [facility] [address], [place], [place], [address]  Phone: [phone]   Fax:  [phone]  Name: Ashley Lawson MRN: [id number] Date of Birth: 07-Jan-1933

## 2019-09-29 ENCOUNTER — Encounter (HOSPITAL_COMMUNITY)
Admission: RE | Admit: 2019-09-29 | Discharge: 2019-09-29 | Disposition: A | Discharge status: Home / Self Care | Payer: Medicare Other | Source: Ambulatory Visit | Attending: Urology | Admitting: Urology

## 2019-09-29 ENCOUNTER — Other Ambulatory Visit: Payer: Self-pay | Admitting: Adult Health

## 2019-09-29 ENCOUNTER — Telehealth: Payer: Self-pay | Admitting: Cardiovascular Disease

## 2019-09-29 ENCOUNTER — Encounter: Payer: Self-pay | Admitting: Physical Therapy

## 2019-09-29 ENCOUNTER — Other Ambulatory Visit: Payer: Self-pay

## 2019-09-29 ENCOUNTER — Other Ambulatory Visit: Payer: Self-pay | Admitting: Cardiovascular Disease

## 2019-09-29 ENCOUNTER — Other Ambulatory Visit: Payer: Self-pay | Admitting: Urology

## 2019-09-29 ENCOUNTER — Ambulatory Visit: Payer: Medicare Other | Admitting: Physical Therapy

## 2019-09-29 ENCOUNTER — Encounter (HOSPITAL_COMMUNITY): Payer: Self-pay

## 2019-09-29 DIAGNOSIS — M6281 Muscle weakness (generalized): Secondary | ICD-10-CM | POA: Diagnosis not present

## 2019-09-29 DIAGNOSIS — R293 Abnormal posture: Secondary | ICD-10-CM

## 2019-09-29 DIAGNOSIS — Z01812 Encounter for preprocedural laboratory examination: Secondary | ICD-10-CM | POA: Diagnosis present

## 2019-09-29 HISTORY — DX: Cardiac arrhythmia, unspecified: I49.9

## 2019-09-29 HISTORY — DX: Anxiety disorder, unspecified: F41.9

## 2019-09-29 LAB — CBC
HCT: 41.3 % (ref 36.0–46.0)
Hemoglobin: 13.6 g/dL (ref 12.0–15.0)
MCH: 32 pg (ref 26.0–34.0)
MCHC: 32.9 g/dL (ref 30.0–36.0)
MCV: 97.2 fL (ref 80.0–100.0)
Platelets: 298 10*3/uL (ref 150–400)
RBC: 4.25 MIL/uL (ref 3.87–5.11)
RDW: 12.6 % (ref 11.5–15.5)
WBC: 9.1 10*3/uL (ref 4.0–10.5)
nRBC: 0 % (ref 0.0–0.2)

## 2019-09-29 NOTE — Progress Notes (Signed)
COVID Vaccine Completed:yes Date COVID Vaccine completed:06/02/19 COVID vaccine manufacturer:   Moderna    PCP - Dr. Idell Pickles Cardiologist - Dr. Angelena Form  Chest x-ray - 09/29/19 EKG - 08/03/19 Stress Test - no ECHO - 06/18/19 Cardiac Cath - no  Sleep Study - no CPAP -   Fasting Blood Sugar - NA Checks Blood Sugar _____ times a day  Blood Thinner Instructions:Eliquis Aspirin Instructions:hold 3 days prior/ Dr. Angelena Form Last Dose:10/01/19  Anesthesia review:   Patient denies shortness of breath, fever, cough and chest pain at PAT appointment Yes   Patient verbalized understanding of instructions that were given to them at the PAT appointment. Patient was also instructed that they will need to review over the PAT instructions again at home before surgery. Yes Pt is not very active but she reports no SOB with stairs or ADLs.

## 2019-09-29 NOTE — Telephone Encounter (Signed)
Patient with diagnosis of afib on Eliquis for anticoagulation.    Procedure: hydrodistension of bladder Date of procedure: 10/05/19  CHADS2-VASc score of 3 (age x2, sex)  CrCl 74m/min Platelet count 286K  Per office protocol, patient can hold Eliquis for 2 days prior to procedure.

## 2019-09-29 NOTE — Telephone Encounter (Signed)
   [facility] Pre-operative Risk Assessment    HEARTCARE STAFF: - Please ensure there is not already an duplicate clearance open for this procedure. - Under Visit Info/Reason for Call, type in Other and utilize the format Clearance MM/DD/YY or Clearance TBD. Do not use dashes or single digits. - If request is for dental extraction, please clarify the # of teeth to be extracted.  Request for surgical clearance:  1. What type of surgery is being performed? Hydrodistension  Of bladder  2. When is this surgery scheduled?  10-05-19  3. What type of clearance is required (medical clearance vs. Pharmacy clearance to hold med vs. Both)?  Medicine 4.  5. Are there any medications that need to be held prior to surgery and how long? Eliquis  6. Practice name and name of physician performing surgery? Dr Matilde Sprang  7. What is the office phone number?  [phone]   7.   What is the office fax number?   [phone]   8.   Anesthesia type (None, local, MAC, general) ? General Glyn Ade 09/29/2019, 8:29 AM  _________________________________________________________________   (provider comments below)

## 2019-09-29 NOTE — Patient Instructions (Addendum)
DUE TO COVID-19 ONLY ONE VISITOR IS ALLOWED TO COME WITH YOU AND STAY IN THE WAITING ROOM ONLY DURING PRE OP AND PROCEDURE DAY OF SURGERY. THE 1 VISITOR MAY VISIT WITH YOU AFTER SURGERY IN YOUR PRIVATE ROOM DURING VISITING HOURS ONLY!  YOU NEED TO HAVE A COVID 19 TEST ON_7/2______ @_2 :05______, THIS TEST MUST BE DONE BEFORE SURGERY, COME  [address] , [id number].  ([place]) ONCE YOUR COVID TEST IS COMPLETED, PLEASE BEGIN THE QUARANTINE INSTRUCTIONS AS OUTLINED IN YOUR HANDOUT.                Ashley Lawson    Your procedure is scheduled on: 10/05/19   Report to [facility] Main  Entrance   Report to admitting at   8:15 AM     Call this number if you have problems the morning of surgery [phone]    Remember: Do not eat food or drink liquids :After Midnight.   BRUSH YOUR TEETH MORNING OF SURGERY AND RINSE YOUR MOUTH OUT, NO CHEWING GUM CANDY OR MINTS.     Take these medicines the morning of surgery with A SIP OF WATER: Diltiazem, Claritin if needed, Ativan if needed                                 You may not have any metal on your body including hair pins and              piercings  Do not wear jewelry, make-up, lotions, powders or perfumes, deodorant             Do not wear nail polish on your fingernails.  Do not shave  48 hours prior to surgery.               Do not bring valuables to the hospital. [place].  Contacts, dentures or bridgework may not be worn into surgery.      Patients discharged the day of surgery will not be allowed to drive home.   IF YOU ARE HAVING SURGERY AND GOING HOME THE SAME DAY, YOU MUST HAVE AN ADULT TO DRIVE YOU HOME AND BE WITH YOU FOR 24 HOURS.   YOU MAY GO HOME BY TAXI OR UBER OR ORTHERWISE, BUT AN ADULT MUST ACCOMPANY YOU HOME AND STAY WITH YOU FOR 24 HOURS.  Name and phone number of your driver:  Special Instructions: N/A              Please  read over the following fact sheets you were given: _____________________________________________________________________             [facility] - Preparing for Surgery Before surgery, you can play an important role.   Because skin is not sterile, your skin needs to be as free of germs as possible.   You can reduce the number of germs on your skin by washing with CHG (chlorahexidine gluconate) soap before surgery.   CHG is an antiseptic cleaner which kills germs and bonds with the skin to continue killing germs even after washing. Please DO NOT use if you have an allergy to CHG or antibacterial soaps.   If your skin becomes reddened/irritated stop using the CHG and inform your nurse when you arrive at Short Stay. Do not shave (including legs  and underarms) for at least 48 hours prior to the first CHG shower.   Please follow these instructions carefully:  1.  Shower with CHG Soap the night before surgery and the  morning of Surgery.  2.  If you choose to wash your hair, wash your hair first as usual with your  normal  shampoo.  3.  After you shampoo, rinse your hair and body thoroughly to remove the  shampoo.                                        4.  Use CHG as you would any other liquid soap.  You can apply chg directly  to the skin and wash                       Gently with a scrungie or clean washcloth.  5.  Apply the CHG Soap to your body ONLY FROM THE NECK DOWN.   Do not use on face/ open                           Wound or open sores. Avoid contact with eyes, ears mouth and genitals (private parts).                       Wash face,  Genitals (private parts) with your normal soap.             6.  Wash thoroughly, paying special attention to the area where your surgery  will be performed.  7.  Thoroughly rinse your body with warm water from the neck down.  8.  DO NOT shower/wash with your normal soap after using and rinsing off  the CHG Soap.             9.  Pat yourself dry with a  clean towel.            10.  Wear clean pajamas.            11.  Place clean sheets on your bed the night of your first shower and do not  sleep with pets.  Day of Surgery : Do not apply any lotions/deodorants the morning of surgery.  Please wear clean clothes to the hospital/surgery center.   FAILURE TO FOLLOW THESE INSTRUCTIONS MAY RESULT IN THE CANCELLATION OF YOUR SURGERY PATIENT SIGNATURE_________________________________  NURSE SIGNATURE__________________________________  ________________________________________________________________________

## 2019-09-29 NOTE — Telephone Encounter (Signed)
   Primary Cardiologist: Lauree Chandler, MD  Chart reviewed as part of pre-operative protocol coverage. Given past medical history and time since last visit, based on ACC/AHA guidelines, Ashley Lawson would be at acceptable risk for the planned procedure without further cardiovascular testing.   Patient with diagnosis of afib on Eliquis for anticoagulation.    Procedure: hydrodistension of bladder Date of procedure: 10/05/19  CHADS2-VASc score of 3 (age x2, sex)  CrCl 63m/min Platelet count 286K  Per office protocol, patient can hold Eliquis for 2 days prior to procedure.    I will route this recommendation to the requesting party via Epic fax function and remove from pre-op pool.  Please call with questions.  Ashley Ng Ankita Newcomer NP-C    09/29/2019, 9:33 AM [email or web address] Office [phone] [phone]

## 2019-09-29 NOTE — Therapy (Signed)
[facility] [address] [place], [place], [address] Phone: [phone]   Fax:  [phone]  Physical Therapy Treatment  Patient Details  Name: Ashley Lawson MRN: [id number] Date of Birth: 10/26/1932 Referring Provider (PT): Gatha Mayer, MD   Encounter Date: 09/29/2019   PT End of Session - 09/29/19 1731    Visit Number 12    Date for PT Re-Evaluation 10/28/19    PT Start Time 1530    PT Stop Time 1613    PT Time Calculation (min) 43 min    Activity Tolerance Patient tolerated treatment well    Behavior During Therapy [facility] for tasks assessed/performed           Past Medical History:  Diagnosis Date  . Abdominal pain 12/2009   hospitalized  . Allergic rhinitis   . Anxiety   . Chronic constipation   . Compression fx, lumbar spine ([place])   . DDD (degenerative disc disease), lumbar   . Diverticulosis of colon   . Dog bite(E906.0) 08/30/2011  . Dysrhythmia   . GERD (gastroesophageal reflux disease)   . Hepatic steatosis    pt denies  . Hyperlipidemia   . Hyperplastic colon polyp   . IBS (irritable bowel syndrome)    constipation predominant  . Internal hemorrhoids   . Osteoporosis    dexa 2011 -2.7 hip nl spine  . PAF (paroxysmal atrial fibrillation) ([place])    a. dx 05/2017.  Marland Kitchen PMR (polymyalgia rheumatica) ([facility]) 08/30/2011   under rheum care  on low dose pred 5 mg   . Renal disorder     Past Surgical History:  Procedure Laterality Date  . ABDOMINAL HYSTERECTOMY     with bladder tact and rectocele repair  . CATARACT EXTRACTION  2010   both  . COLONOSCOPY  2016 was most recent    There were no vitals filed for this visit.   Subjective Assessment - 09/29/19 1730    Subjective I am at least 70% better maybe 80%.  Still having pain today and had pain across the abdomen the other day.  Just getting tired of it.    Currently in Pain? Yes    Pain Score 3     Pain Location Abdomen    Pain  Orientation Left;Lower    Pain Descriptors / Indicators Aching                             [facility] Adult PT Treatment/Exercise - 09/29/19 0001      Lumbar Exercises: Aerobic   Nustep L2 x 6 min - PT present for status - had some goin pain with this      Manual Therapy   Myofascial Release Lt abdomen and bladder; diaphragm release in all planes hand ant/posterior                    PT Short Term Goals - 09/20/19 1545      PT SHORT TERM GOAL #1   Title 25% less pain    Baseline 30-40% less    Status Achieved      PT SHORT TERM GOAL #2   Title ind with toileting techniques    Status Achieved             PT Long Term Goals - 09/29/19 1539      PT LONG TERM GOAL #1   Title Pt will report at least  60% less abdominal discomfort due to improved ability to coordinate respiratory and pelvic diaphragms during funcitonal movements.    Baseline 70%    Status Achieved      PT LONG TERM GOAL #2   Title Pt will report she is able to empty bladder and feel no sensation of urinary retention.    Baseline 30-40% of the time I have to push a lot (better than when I started)    Status Partially Met      PT LONG TERM GOAL #3   Title Pt will be ind with advanced HEP    Status Achieved      PT LONG TERM GOAL #4   Title Pt will improved pelvic strength to 3/5 and able to hold at least 10 seconds for improved trunk stability    Baseline 5 sec    Status Not Met                 Plan - 09/29/19 1729    Clinical Impression Statement Pt is overall feeling at least 70% better.  She saw the urologist and decided to have procedure to improve remaining symptoms as she continues to have some pain and difficulty urinating.  Pt is ind with HEP and will discharge from PT today    Comorbidities groin and abdominal pain; chronic constipation, IBS, diverticulitis, hx of bladder tacking and hysterectomy    PT Treatment/Interventions ADLs/Self Care Home  Management;Biofeedback;Cryotherapy;Electrical Stimulation;Moist Heat;Therapeutic activities;Neuromuscular re-education;Therapeutic exercise;Patient/family education;Dry needling;Passive range of motion;Manual techniques    PT Next Visit Plan d/c today    PT Home Exercise Plan Access Code: [email or web address]    Consulted and Agree with Plan of Care Patient           Patient will benefit from skilled therapeutic intervention in order to improve the following deficits and impairments:  Pain, Postural dysfunction, Increased fascial restricitons, Decreased strength, Decreased coordination, Decreased range of motion, Increased muscle spasms  Visit Diagnosis: Muscle weakness (generalized)  Abnormal posture     Problem List Patient Active Problem List   Diagnosis Date Noted  . Syncope 06/17/2019  . Seasonal allergies 06/17/2019  . DDD (degenerative disc disease), lumbar   . Proctocolitis   . CKD (chronic kidney disease), stage III   . Internal hemorrhoids   . Dizziness 09/17/2018  . Abdominal pain 09/17/2018  . AF (paroxysmal atrial fibrillation) ([place]) 09/17/2018  . Chest discomfort 05/13/2018  . Coagulation defect ([place]) 01/30/2018  . Deviated septum 01/30/2018  . Epistaxis, recurrent 01/30/2018  . Atrial fibrillation with RVR ([place]) 05/02/2017  . Rhinitis, allergic 05/31/2015  . Renal cyst incidental  01/19/2015  . Heartburn 01/14/2014  . GERD (gastroesophageal reflux disease) 01/14/2014  . Osteoporosis 01/14/2014  . IBS (irritable bowel syndrome)   . Nail, ingrown 06/22/2012  . Arthritis 02/19/2012  . Screening, lipid 02/18/2011  . Rash and nonspecific skin eruption 02/18/2011  . Visit for preventive health examination 02/18/2011  . Skin cancer 10/14/2010  . Abdominal bloating 10/08/2010  . Chronic constipation 01/15/2010  . Lower urinary tract symptoms (LUTS) 01/15/2010  . SUPRAPUBIC PAIN 08/01/2009  . ABDOMINAL PAIN, EPIGASTRIC 06/16/2009  . DIVERTICULOSIS, COLON 06/07/2007  .  Hyperlipidemia 06/05/2007  . ANXIETY STATE, UNSPECIFIED 06/05/2007  . Allergic rhinitis 06/05/2007  . IBS 06/05/2007  . OSTEOPOROSIS 06/05/2007  . ABDOMINAL PAIN, GENERALIZED 06/05/2007    Jule Ser, PT 09/29/2019, 5:34 PM  [facility] [address] [place], [place], [address] Phone: [phone]  Fax:  [phone]  Name: Ashley Lawson MRN: [id number] Date of Birth: 1932-06-04  PHYSICAL THERAPY DISCHARGE SUMMARY  Visits from Start of Care: 12  Current functional level related to goals / functional outcomes: See above goals   Remaining deficits: See above   Education / Equipment: HEP  Plan: Patient agrees to discharge.  Patient goals were partially met. Patient is being discharged due to lack of progress.  ?????   Pt will also be getting procedure to "stretch the bladder" as she reports after seeing the urologist   Gustavus Bryant, PT 09/29/19 5:35 PM

## 2019-09-30 NOTE — Telephone Encounter (Signed)
Last OV 09/21/2019  Last filled 09/03/2019, #24 with 0 refills

## 2019-09-30 NOTE — Progress Notes (Signed)
Anesthesia Chart Review:   Case: [id number] Date/Time: 10/05/19 1000   Procedure: CYSTOSCOPY/HYDRODISTENSION AND INSTILLATION OF MARCAINE AND PYRIDIUM (N/A )   Anesthesia type: General   Pre-op diagnosis: PELVIC PAIN   Location: [facility] ROOM 04 / [facility]   Surgeons: Bjorn Loser, MD      DISCUSSION:  Pt is an 86 year old with hx atrial fibrillation, hepatic steatosis, polymyalgia rheumatica   Pt to hold eliquis for 2 day before procedure   VS: BP (!) 126/54   Pulse 66   Temp 36.8 C (Oral)   Resp 18   Ht 5' 2"  (1.575 m)   Wt 71.4 kg   SpO2 100%   BMI 28.78 kg/m    PROVIDERS: - PCP is Panosh, Standley Brooking, MD  - Cardiologist is Lauree Chandler, MD. Last office visit 09/06/19. Pt cleared for surgery at acceptable risk by Johnanna Schneiders, NP on 09/29/19   LABS: Labs reviewed: Acceptable for surgery.  - CMP 08/02/19 showed normal liver function  (all labs ordered are listed, but only abnormal results are displayed)  Labs Reviewed  CBC     IMAGES: CT abd/pelvis 08/03/19:  1. No explanation for patient's persistent left lower quadrant abdominal pain. Specifically, no evidence of diverticulitis, enteric or urinary obstruction. Normal appearance of the appendix. 2. Previously questioned rectal wall thickening has resolved in the interval. 3.  Aortic Atherosclerosis    EKG 08/02/19: NSR   CV: Cardiac event monitor 08/19/19: atrial fibrillation   Echo 06/18/19:  1. Left ventricular ejection fraction, by estimation, is 65 to 70%. The left ventricle has normal function. The left ventricle has no regional wall motion abnormalities. Left ventricular diastolic parameters are indeterminate.  2. Right ventricular systolic function is normal. The right ventricular size is normal. There is normal pulmonary artery systolic pressure.  3. Left atrial size was moderately dilated.  4. Right atrial size was severely dilated.  5. The mitral valve is normal in structure. Trivial mitral valve  regurgitation. No evidence of mitral stenosis.  6. Tricuspid valve regurgitation is mild to moderate.  7. The aortic valve is normal in structure. Aortic valve regurgitation is not visualized. No aortic stenosis is present.  8. The inferior vena cava is normal in size with greater than 50% respiratory variability, suggesting right atrial pressure of 3 mmHg.   Past Medical History:  Diagnosis Date  . Abdominal pain 12/2009   hospitalized  . Allergic rhinitis   . Anxiety   . Chronic constipation   . Compression fx, lumbar spine ([place])   . DDD (degenerative disc disease), lumbar   . Diverticulosis of colon   . Dog bite(E906.0) 08/30/2011  . Dysrhythmia   . GERD (gastroesophageal reflux disease)   . Hepatic steatosis    pt denies  . Hyperlipidemia   . Hyperplastic colon polyp   . IBS (irritable bowel syndrome)    constipation predominant  . Internal hemorrhoids   . Osteoporosis    dexa 2011 -2.7 hip nl spine  . PAF (paroxysmal atrial fibrillation) ([place])    a. dx 05/2017.  Marland Kitchen PMR (polymyalgia rheumatica) ([facility]) 08/30/2011   under rheum care  on low dose pred 5 mg   . Renal disorder     Past Surgical History:  Procedure Laterality Date  . ABDOMINAL HYSTERECTOMY     with bladder tact and rectocele repair  . CATARACT EXTRACTION  2010   both  . COLONOSCOPY  2016 was most recent    MEDICATIONS: . acetaminophen (TYLENOL) 500  MG tablet  . AMBULATORY NON FORMULARY MEDICATION  . Ascorbic Acid (VITAMIN C) 100 MG tablet  . calcium carbonate (OS-CAL) 1250 (500 Ca) MG chewable tablet  . desonide (DESOWEN) 0.05 % cream  . diltiazem (CARDIZEM CD) 120 MG 24 hr capsule  . ELIQUIS 5 MG TABS tablet  . fluticasone (FLONASE) 50 MCG/ACT nasal spray  . guaiFENesin (MUCINEX) 600 MG 12 hr tablet  . loratadine (CLARITIN) 10 MG tablet  . LORazepam (ATIVAN) 0.5 MG tablet  . magnesium hydroxide (MILK OF MAGNESIA) 400 MG/5ML suspension  . Multiple Vitamin (MULTIVITAMIN) tablet  . PREMARIN vaginal  cream  . Probiotic Product (ALIGN) 4 MG CAPS  . RESTASIS 0.05 % ophthalmic emulsion  . triamcinolone (NASACORT) 55 MCG/ACT AERO nasal inhaler   No current facility-administered medications for this encounter.   - Pt to hold eliquis for 2 day before procedure   If no changes, I anticipate pt can proceed with surgery as scheduled.   Willeen Cass, FNP-BC [facility]/Anesthesiology Phone: [phone] 09/30/2019 10:39 AM

## 2019-09-30 NOTE — Anesthesia Preprocedure Evaluation (Addendum)
Anesthesia Evaluation  Patient identified by MRN, date of birth, ID band Patient awake    Reviewed: Allergy & Precautions, NPO status , Patient's Chart, lab work & pertinent test results  Airway Mallampati: I  TM Distance: >3 FB Neck ROM: Full    Dental no notable dental hx. (+) Edentulous Upper, Partial Lower,    Pulmonary neg pulmonary ROS,    Pulmonary exam normal breath sounds clear to auscultation       Cardiovascular Exercise Tolerance: Good Normal cardiovascular exam+ dysrhythmias Atrial Fibrillation  Rhythm:Irregular Rate:Normal  Echo 06/18/19:  1. Left ventricular ejection fraction, by estimation, is 65 to 70%. The left ventricle has normal function. The left ventricle has no regional wall motion abnormalities. Left ventricular diastolic parameters are indeterminate.    Neuro/Psych Anxiety negative neurological ROS     GI/Hepatic Neg liver ROS, GERD  ,  Endo/Other  negative endocrine ROS  Renal/GU Renal disease     Musculoskeletal  (+) Arthritis ,   Abdominal   Peds  Hematology Lab Results      Component                Value               Date                      WBC                      9.1                 09/29/2019                HGB                      13.6                09/29/2019                HCT                      41.3                09/29/2019                MCV                      97.2                09/29/2019                PLT                      298                 09/29/2019              Anesthesia Other Findings   Reproductive/Obstetrics                            Anesthesia Physical Anesthesia Plan  ASA: III  Anesthesia Plan: General   Post-op Pain Management:    Induction: Intravenous  PONV Risk Score and Plan: 4 or greater and Treatment may vary due to age or medical condition, Ondansetron and Dexamethasone  Airway Management Planned:  LMA  Additional Equipment: None  Intra-op Plan:   Post-operative Plan:  Informed Consent: I have reviewed the patients History and Physical, chart, labs and discussed the procedure including the risks, benefits and alternatives for the proposed anesthesia with the patient or authorized representative who has indicated his/her understanding and acceptance.     Dental advisory given  Plan Discussed with: CRNA  Anesthesia Plan Comments: (See APP note by Durel Salts, FNP)      Anesthesia Quick Evaluation

## 2019-10-01 ENCOUNTER — Other Ambulatory Visit (HOSPITAL_COMMUNITY)
Admission: RE | Admit: 2019-10-01 | Discharge: 2019-10-01 | Disposition: A | Discharge status: Home / Self Care | Payer: Medicare Other | Source: Ambulatory Visit | Attending: Urology | Admitting: Urology

## 2019-10-01 DIAGNOSIS — Z20822 Contact with and (suspected) exposure to covid-19: Secondary | ICD-10-CM | POA: Insufficient documentation

## 2019-10-01 DIAGNOSIS — Z01812 Encounter for preprocedural laboratory examination: Secondary | ICD-10-CM | POA: Insufficient documentation

## 2019-10-01 LAB — SARS CORONAVIRUS 2 (TAT 6-24 HRS): SARS Coronavirus 2: NEGATIVE

## 2019-10-01 NOTE — H&P (Signed)
2018  The patient on February 15 had cystoscopy that was negative for interstitial cystitis. She had findings in keeping with submucosal pigments referred to as hemosiderin.   Prior to HOD:  The patient has been in this office in the emergency room with suprapubic discomfort. She will also get pain near the urethra when she begin urination. Sometimes the urine is foul smelling. Sometimes she is increased frequency. I reviewed the patient's medical records and she has had 4 negative cultures in the Epic system. She recently had a CT scan the abdomen and pelvis with contrast that showed no renal or pelvic abnormalities   I spoke to the patient and daughter that her vaginal symptoms are likely from atrophy and talked to them about local estrogen cream.   She still has a pinhead-like discomfort in the suprapubic area near the symphysis pubis. Frequency is stable. Hydrodistention did not help. She clinically does not have bacterial cystitis or interstitial cystitis. I gave her Premarin cream 3 times a week for 1 month and then once weekly.   Suprapubic discomfort with urination may be better on the Premarin cream. I asked her to continue it twice a week. I did give her some Uribel samples to try twice a day   Nocturia 3 stable but she does not want it treated. Pain syndrome has gone and she is not using estrogen.   Patient was diagnosis of diverticulitis and was treated recently just finished antibiotics 2 weeks ago. She has had for months she has had suprapubic pain worse on the left. I think the same pain but she really wants me look into it again. She also has to strain or push to urinate. She has seen the nurse practitioner for the exact same symptoms   Flow is not as good as they used to be. She often has to push. She get up 4 times a night. She can void every 1-2 hours. She is bothered a lot by the flow symptoms as well as the pain   She had a CT scan in June that was normal. She had a negative  urine culture. She has had infrequent positive cultures. Seen many times by physical therapy   Cystoscopy normal   It might be difficult to reach her treatment goal. I will try her on Flomax. She likely has a mild overactive bladder Myrbetriq might be helpful. Percutaneous tibial nerve stimulation might be something to consider   Last urine culture was positive  She still states she can have left lower quadrant pain with standing and takes MiraLax. She was treated for infection clinically not infected today  Patient is doing better overall I do not think there is lot I can offer. I will call the urine culture is positive. Otherwise I will see her p.r.n.   Today  When the patient was seen about 6 months ago she did have some Streptococcus in the urine that was treated. The time before she had Staphylococcus. The culture prior and many prior were negative. These cultures may not have represent true infection versus colonization  Patient is having suprapubic and left lower quadrant pain. It sounds like she has been treated for diverticulitis by Dr. Carlean Purl and has had doxycycline twice. She had a CT scan negative for diverticulitis in May 2021 was. CT scan for the same was negative in March 2021. She had 2 urine cultures negative in June and another negative culture back in January. She is also being treated by physical therapy. I  reviewed multiple emergency room notes under admission to the hospital. After about 2 days given her Flagyl her symptoms improved.   Urine rare bacteria and sent for culture. It was brought in from an outside specimen   I do not think the patient has a genitourinary cause of her pain.   The patient has had these symptoms off and on for a few years. She told me that the hydrodistention held but I documented and remember that it did not. She has had bladder rescue treatments in the past I believe my nurse practitioners. There is no evidence she has interstitial cystitis. She  has gotten better with Premarin cream before. She is still using the Premarin cream.   The patient I spoke about a hydrodistention I reviewed risks with her. I noted to her firmly but politely that there has been a history where I have been doing things and examining her over and over without a good solution for her problem or a diagnosis. I think because of the severity of her symptoms she is wishing that I can do something to help her. This is been of repetitive pattern   Now she wanted me to do the hydrodistention while my recall and clinical records said it did not help before. I told her that I would do this 1 last time but if she does not improve I will not be treating her further other than positive urine cultures. I noted to her that I will not be given her narcotic pain medicine without a diagnosis and she was not asking for any. Unless she has a dramatic response to hydrodistention I do not believe this pain is from her bladder, I believe it's GI in origin. If it is from her bladder it may be best to refer her as a refractory patient and of course there could be false-positive results as well. I can appreciate that she is hurting a lot but as I've previously noted I do not think it is from her bladder. As the conversation would continue as she has before she thinks that her bladder has dropped and feels like something is falling out. She wanted to be examined again which was reasonable for reassurance. I think the fundamental issue is her significant discomfort but not accepting my advice that I do not think it is from her genito- urinary system. We will need to stop her blood thinners   Pelvic exam repeated her request. Grade 2 cystocele with no tenderness or vaginitis and narrow introitus. Prolapse unrelated pain and is mild   I called in Keflex 500 mg 3 times a day for 7 days           ALLERGIES: Amoxicillin TABS - Skin Rash Bactrim DS TABS - Dizziness, Nausea Penicillins - Skin  Rash Uribel CAPS - Other Reaction, UNK     Notes: Cipro causes headaches     MEDICATIONS: Doxycycline Hyclate  Premarin 0.625 mg/gram cream with applicator INSERT 1 GRAM VAGINALLY THREE TIMES A WEEKLY, THEN ONCE WEEKLY  Tamsulosin Hcl 0.4 mg capsule 1 capsule PO Daily  Align  Calcium TABS Oral  Eliquis  Lorazepam  Miralax  Mucinex  Saline Nasal Spray  Tylenol  Vita-C  Vitamin D TABS Oral     GU PSH: Bladder Repair - 2011 Catheterization For Collection Of Specimen, Single Patient, All Places Of Service - 05/19/2018, 2018 Catheterize For Residual - 2018 Cystoscopy - 02/03/2019, 2018 Cystoscopy Hydrodistention - 2018 Hysterectomy Unilat SO - 2011 Repair of Rectocele -  2011     NON-GU PSH: Bmi>=30Or<22 Cal No Followup - 2019 Cataract Surgery.. Doc Meds Verified W/Pt Or Re - 2019 Pain Doc Pos And Plan - 2019     GU PMH: Pelvic/perineal pain - 06/09/2018, - 06/04/2018 (Worsening, Chronic), Culture urine. No ABX unless culture proven UTI. Ketorolac 30 mg IM today. Marcaine bladder instillation today. Refer to PT/OT with chronic recurring bladder pain. , - 2019 Weak Urinary Stream - 06/09/2018, - 06/04/2018, Weak urinary stream, - 2014 Abdominal Pain Unspec - 05/19/2018, - 05/05/2018, - 04/08/2018 Microscopic hematuria - 05/19/2018 Urinary Tract Inf, Unspec site - 05/19/2018, - 05/05/2018, Pyuria, - 2014 LLQ pain - 05/12/2018 Urinary Frequency (Stable) - 2019, Urinary frequency, - 2016 Nocturia (Stable) - 2018, Nocturia, - 2015 Chronic cystitis (w/o hematuria) - 2018 Microscopic hematuria (Stable, Chronic), Stable microscopic hematuria. Last saw Dr. Matilde Sprang 11/16 and was told hematuria workup negative f/u PRN - 2017 Suprapubic pain (Worsening, Chronic), Culture urine. No ABX unless culture proven UTI - 2017 Oth GU systems Signs/Symptoms, Bladder pain - 2017 Other microscopic hematuria, Microscopic hematuria - 2017 Renal cyst, Renal cyst, acquired - 2016 Cystocele, Unspec, Vaginal wall  prolapse - 2015 Straining on Urination, Straining on urination - 2014    NON-GU PMH: Fecal smearing - 06/04/2018 Muscle weakness (generalized) - 06/04/2018, - 05/12/2018, - 10/22/2017, - 2019, - 2019, - 2019, - 2019, - 2019, - 2019, - 2019 Other muscle spasm - 06/04/2018, - 05/12/2018, - 10/22/2017, - 2019, - 2019, - 2019, - 2019, - 2019, - 2019, - 2019 Other specified disorders of muscle - 06/04/2018, - 05/12/2018, - 10/22/2017, - 2019, - 2019, - 2019, - 2019, - 2019, - 2019, - 2019 Encounter for general adult medical examination without abnormal findings, Encounter for preventive health examination - 2015 Personal history of other diseases of the musculoskeletal system and connective tissue, History of arthritis - 2015 Age-related osteoporosis without current pathological fracture, Osteoporosis - 2014 Diverticulosis, Diverticulosis - 2014 Irritable bowel syndrome with diarrhea, Irritable Bowel Syndrome - 2014 Atrial Fibrillation    FAMILY HISTORY: Acute Myocardial Infarction - Father Death - Runs In Family Family Health Status Number - Runs In Family Father Deceased At [place] ___ - Runs In Family malignant neoplasm of urinary bladder - Runs In Family Mother Deceased At Age 53 from diabetic complicati - Runs In Family Renal Cell Carcinoma - Brother Stroke Syndrome - Mother   SOCIAL HISTORY: Marital Status: Married Preferred Language: English; Ethnicity: Not Hispanic Or Latino; Race: White Current Smoking Status: Patient has never smoked.  Does not use smokeless tobacco. Has never drank.  Does not use drugs. Drinks 3 caffeinated drinks per day.    REVIEW OF SYSTEMS:    GU Review Female:   Patient reports get up at night to urinate and stream starts and stops. Patient denies frequent urination, hard to postpone urination, burning /pain with urination, leakage of urine, trouble starting your stream, have to strain to urinate, and being pregnant.  Gastrointestinal (Upper):   Patient denies nausea,  vomiting, and indigestion/ heartburn.  Gastrointestinal (Lower):   Patient denies diarrhea and constipation.  Constitutional:   Patient denies fever, night sweats, weight loss, and fatigue.  Skin:   Patient denies skin rash/ lesion and itching.  Eyes:   Patient denies blurred vision and double vision.  Ears/ Nose/ Throat:   Patient denies sore throat and sinus problems.  Hematologic/Lymphatic:   Patient denies swollen glands and easy bruising.  Cardiovascular:   Patient denies leg swelling and chest pains.  Respiratory:   Patient denies cough and shortness of breath.  Endocrine:   Patient denies excessive thirst.  Musculoskeletal:   Patient denies back pain and joint pain.  Neurological:   Patient denies headaches and dizziness.  Psychologic:   Patient denies depression and anxiety.   VITAL SIGNS:      09/28/2019 03:13 PM  BP 120/74 mmHg  Pulse 76 /min  Temperature 97.7 F / 36.5 C   PAST DATA REVIEW: None   PROCEDURES:          Urinalysis w/Scope Dipstick Dipstick Cont'd Micro  Color: Yellow Bilirubin: Neg mg/dL WBC/hpf: 0 - 5/hpf  Appearance: Slightly Cloudy Ketones: Trace mg/dL RBC/hpf: 10 - 20/hpf  Specific Gravity: 1.010 Blood: 2+ ery/uL Bacteria: Rare (0-9/hpf)  pH: 7.5 Protein: Neg mg/dL Cystals: NS (Not Seen)  Glucose: Neg mg/dL Urobilinogen: 0.2 mg/dL Casts: NS (Not Seen)    Nitrites: Neg Trichomonas: Not Present    Leukocyte Esterase: Neg leu/uL Mucous: Not Present      Epithelial Cells: 0 - 5/hpf      Yeast: NS (Not Seen)      Sperm: Not Present    ASSESSMENT:      ICD-10 Details  1 GU:   Abdominal Pain Unspec - R10.9   2   Chronic cystitis (w/o hematuria) - N30.20      PLAN:            Medications New Meds: Cephalexin 500 mg tablet 1 tablet PO TID   #21  0 Refill(s)    After a thorough review of the management options for the patient's condition the patient  elected to proceed with surgical therapy as noted above. We have discussed the potential benefits  and risks of the procedure, side effects of the proposed treatment, the likelihood of the patient achieving the goals of the procedure, and any potential problems that might occur during the procedure or recuperation. Informed consent has been obtained.

## 2019-10-01 NOTE — Telephone Encounter (Signed)
87 F 72.2 kg SCr 0.8 (5/21) LOV McAlhany 6/21

## 2019-10-05 ENCOUNTER — Ambulatory Visit (HOSPITAL_COMMUNITY)
Admission: RE | Admit: 2019-10-05 | Discharge: 2019-10-05 | Disposition: A | Discharge status: Home / Self Care | Payer: Medicare Other | Attending: Urology | Admitting: Urology

## 2019-10-05 ENCOUNTER — Encounter (HOSPITAL_COMMUNITY): Payer: Self-pay | Admitting: Urology

## 2019-10-05 ENCOUNTER — Ambulatory Visit (HOSPITAL_COMMUNITY): Payer: Medicare Other | Admitting: Emergency Medicine

## 2019-10-05 ENCOUNTER — Ambulatory Visit (HOSPITAL_COMMUNITY): Payer: Medicare Other | Admitting: Anesthesiology

## 2019-10-05 ENCOUNTER — Encounter (HOSPITAL_COMMUNITY)
Admission: RE | Disposition: A | Discharge status: Home / Self Care | Payer: Self-pay | Source: Home / Self Care | Attending: Urology

## 2019-10-05 DIAGNOSIS — N3281 Overactive bladder: Secondary | ICD-10-CM | POA: Insufficient documentation

## 2019-10-05 DIAGNOSIS — M810 Age-related osteoporosis without current pathological fracture: Secondary | ICD-10-CM | POA: Diagnosis not present

## 2019-10-05 DIAGNOSIS — E785 Hyperlipidemia, unspecified: Secondary | ICD-10-CM | POA: Diagnosis not present

## 2019-10-05 DIAGNOSIS — F419 Anxiety disorder, unspecified: Secondary | ICD-10-CM | POA: Diagnosis not present

## 2019-10-05 DIAGNOSIS — Z833 Family history of diabetes mellitus: Secondary | ICD-10-CM | POA: Insufficient documentation

## 2019-10-05 DIAGNOSIS — Z7901 Long term (current) use of anticoagulants: Secondary | ICD-10-CM | POA: Diagnosis not present

## 2019-10-05 DIAGNOSIS — K219 Gastro-esophageal reflux disease without esophagitis: Secondary | ICD-10-CM | POA: Insufficient documentation

## 2019-10-05 DIAGNOSIS — R102 Pelvic and perineal pain: Secondary | ICD-10-CM | POA: Insufficient documentation

## 2019-10-05 DIAGNOSIS — N3020 Other chronic cystitis without hematuria: Secondary | ICD-10-CM | POA: Insufficient documentation

## 2019-10-05 DIAGNOSIS — Z8249 Family history of ischemic heart disease and other diseases of the circulatory system: Secondary | ICD-10-CM | POA: Diagnosis not present

## 2019-10-05 DIAGNOSIS — I480 Paroxysmal atrial fibrillation: Secondary | ICD-10-CM | POA: Insufficient documentation

## 2019-10-05 DIAGNOSIS — Z79899 Other long term (current) drug therapy: Secondary | ICD-10-CM | POA: Insufficient documentation

## 2019-10-05 DIAGNOSIS — K5730 Diverticulosis of large intestine without perforation or abscess without bleeding: Secondary | ICD-10-CM | POA: Diagnosis not present

## 2019-10-05 DIAGNOSIS — M5136 Other intervertebral disc degeneration, lumbar region: Secondary | ICD-10-CM | POA: Insufficient documentation

## 2019-10-05 DIAGNOSIS — M353 Polymyalgia rheumatica: Secondary | ICD-10-CM | POA: Insufficient documentation

## 2019-10-05 DIAGNOSIS — M1990 Unspecified osteoarthritis, unspecified site: Secondary | ICD-10-CM | POA: Diagnosis not present

## 2019-10-05 DIAGNOSIS — K589 Irritable bowel syndrome without diarrhea: Secondary | ICD-10-CM | POA: Insufficient documentation

## 2019-10-05 DIAGNOSIS — N183 Chronic kidney disease, stage 3 unspecified: Secondary | ICD-10-CM | POA: Insufficient documentation

## 2019-10-05 HISTORY — PX: CYSTO WITH HYDRODISTENSION: SHX5453

## 2019-10-05 SURGERY — CYSTOSCOPY, WITH BLADDER HYDRODISTENSION
Anesthesia: General | Site: Bladder

## 2019-10-05 MED ORDER — ONDANSETRON HCL 4 MG/2ML IJ SOLN
INTRAMUSCULAR | Status: DC | PRN
  Administered 2019-10-05: 4 mg via INTRAVENOUS

## 2019-10-05 MED ORDER — CHLORHEXIDINE GLUCONATE 0.12 % MT SOLN
15.0000 mL | Freq: Once | OROMUCOSAL | Status: AC
  Administered 2019-10-05: 15 mL via OROMUCOSAL

## 2019-10-05 MED ORDER — LIDOCAINE 2% (20 MG/ML) 5 ML SYRINGE
INTRAMUSCULAR | Status: AC
  Filled 2019-10-05: qty 5

## 2019-10-05 MED ORDER — HYDROCODONE-ACETAMINOPHEN 5-325 MG PO TABS: 1.0000 | ORAL_TABLET | Freq: Four times a day (QID) | ORAL | 0 refills | Status: DC | PRN

## 2019-10-05 MED ORDER — CIPROFLOXACIN IN D5W 400 MG/200ML IV SOLN
400.0000 mg | INTRAVENOUS | Status: AC
  Administered 2019-10-05: 400 mg via INTRAVENOUS
  Filled 2019-10-05: qty 200

## 2019-10-05 MED ORDER — LIDOCAINE 2% (20 MG/ML) 5 ML SYRINGE
INTRAMUSCULAR | Status: DC | PRN
  Administered 2019-10-05: 80 mg via INTRAVENOUS

## 2019-10-05 MED ORDER — PROPOFOL 10 MG/ML IV BOLUS
INTRAVENOUS | Status: DC | PRN
  Administered 2019-10-05: 100 mg via INTRAVENOUS

## 2019-10-05 MED ORDER — CIPROFLOXACIN HCL 250 MG PO TABS
250.0000 mg | ORAL_TABLET | Freq: Two times a day (BID) | ORAL | 0 refills | Status: DC
Start: 2019-10-05 — End: 2019-11-10

## 2019-10-05 MED ORDER — FENTANYL CITRATE (PF) 100 MCG/2ML IJ SOLN
INTRAMUSCULAR | Status: AC
  Filled 2019-10-05: qty 2

## 2019-10-05 MED ORDER — ONDANSETRON HCL 4 MG/2ML IJ SOLN: 4.0000 mg | Freq: Once | INTRAMUSCULAR | Status: DC | PRN

## 2019-10-05 MED ORDER — LACTATED RINGERS IV SOLN: INTRAVENOUS | Status: DC

## 2019-10-05 MED ORDER — FENTANYL CITRATE (PF) 100 MCG/2ML IJ SOLN: 25.0000 ug | INTRAMUSCULAR | Status: DC | PRN

## 2019-10-05 MED ORDER — ACETAMINOPHEN 10 MG/ML IV SOLN: 1000.0000 mg | Freq: Once | INTRAVENOUS | Status: DC | PRN

## 2019-10-05 MED ORDER — ROCURONIUM BROMIDE 10 MG/ML (PF) SYRINGE
PREFILLED_SYRINGE | INTRAVENOUS | Status: AC
  Filled 2019-10-05: qty 10

## 2019-10-05 MED ORDER — PROPOFOL 10 MG/ML IV BOLUS
INTRAVENOUS | Status: AC
  Filled 2019-10-05: qty 20

## 2019-10-05 MED ORDER — ORAL CARE MOUTH RINSE: 15.0000 mL | Freq: Once | OROMUCOSAL | Status: AC

## 2019-10-05 MED ORDER — DEXAMETHASONE SODIUM PHOSPHATE 10 MG/ML IJ SOLN
INTRAMUSCULAR | Status: DC | PRN
  Administered 2019-10-05: 4 mg via INTRAVENOUS

## 2019-10-05 MED ORDER — PHENAZOPYRIDINE HCL 200 MG PO TABS
Freq: Once | ORAL | Status: DC
  Filled 2019-10-05: qty 15

## 2019-10-05 MED ORDER — FENTANYL CITRATE (PF) 100 MCG/2ML IJ SOLN
INTRAMUSCULAR | Status: DC | PRN
  Administered 2019-10-05: 25 ug via INTRAVENOUS

## 2019-10-05 MED ORDER — SODIUM CHLORIDE 0.9 % IR SOLN
Status: DC | PRN
  Administered 2019-10-05: 3000 mL via INTRAVESICAL

## 2019-10-05 SURGICAL SUPPLY — 13 items
BAG URO CATCHER STRL LF (MISCELLANEOUS) ×3 IMPLANT
CATH ROBINSON RED A/P 16FR (CATHETERS) IMPLANT
ELECT REM PT RETURN 15FT ADLT (MISCELLANEOUS) IMPLANT
GLOVE BIOGEL M STRL SZ7.5 (GLOVE) ×3 IMPLANT
GOWN STRL REUS W/TWL XL LVL3 (GOWN DISPOSABLE) ×3 IMPLANT
HOLDER FOLEY CATH W/STRAP (MISCELLANEOUS) IMPLANT
KIT TURNOVER KIT A (KITS) IMPLANT
NDL HYPO 25X1 1.5 SAFETY (NEEDLE) IMPLANT
NEEDLE HYPO 25X1 1.5 SAFETY (NEEDLE) IMPLANT
PACK CYSTO (CUSTOM PROCEDURE TRAY) ×3 IMPLANT
PENCIL SMOKE EVACUATOR (MISCELLANEOUS) IMPLANT
TUBING CONNECTING 10 (TUBING) ×2 IMPLANT
TUBING CONNECTING 10' (TUBING) ×1

## 2019-10-05 NOTE — Transfer of Care (Signed)
Immediate Anesthesia Transfer of Care Note  Patient: Ashley Lawson  Procedure(s) Performed: CYSTOSCOPY/HYDRODISTENSION (N/A Bladder)  Patient Location: PACU  Anesthesia Type:General  Level of Consciousness: awake  Airway & Oxygen Therapy: Patient Spontanous Breathing and Patient connected to face mask oxygen  Post-op Assessment: Report given to RN and Post -op Vital signs reviewed and stable  Post vital signs: Reviewed and stable  Last Vitals:  Vitals Value Taken Time  BP 123/65 10/05/19 1145  Temp    Pulse 64 10/05/19 1146  Resp 17 10/05/19 1146  SpO2 100 % 10/05/19 1146  Vitals shown include unvalidated device data.  Last Pain:  Vitals:   10/05/19 0818  TempSrc: Oral  PainSc: 2          Complications: No complications documented.

## 2019-10-05 NOTE — Interval H&P Note (Signed)
History and Physical Interval Note:  10/05/2019 9:57 AM  Ashley Lawson  has presented today for surgery, with the diagnosis of PELVIC PAIN.  The various methods of treatment have been discussed with the patient and family. After consideration of risks, benefits and other options for treatment, the patient has consented to  Procedure(s): CYSTOSCOPY/HYDRODISTENSION AND INSTILLATION OF MARCAINE AND PYRIDIUM (N/A) as a surgical intervention.  The patient's history has been reviewed, patient examined, no change in status, stable for surgery.  I have reviewed the patient's chart and labs.  Questions were answered to the patient's satisfaction.     [name] A Kaylene Dawn

## 2019-10-05 NOTE — Anesthesia Procedure Notes (Signed)
Procedure Name: LMA Insertion Date/Time: 10/05/2019 11:13 AM Performed by: Sharlette Dense, CRNA Patient Re-evaluated:Patient Re-evaluated prior to induction Oxygen Delivery Method: Circle system utilized Preoxygenation: Pre-oxygenation with 100% oxygen Induction Type: IV induction LMA: LMA with gastric port inserted LMA Size: 3.0 Number of attempts: 1 Placement Confirmation: positive ETCO2 and breath sounds checked- equal and bilateral Tube secured with: Tape Dental Injury: Teeth and Oropharynx as per pre-operative assessment

## 2019-10-05 NOTE — Addendum Note (Signed)
Addendum  created 10/05/19 1446 by Sharlette Dense, CRNA   Intraprocedure Event edited

## 2019-10-05 NOTE — Discharge Instructions (Signed)
I have reviewed discharge instructions in detail with the patient. They will follow-up with me or their physician as scheduled. My nurse will also be calling the patients as per protocol.

## 2019-10-05 NOTE — Op Note (Signed)
Preoperative diagnosis: Chronic cystitis and pelvic pain Postoperative diagnosis: Chronic cystitis and pelvic pain Surgery: Cystoscopy and bladder hydrodistention and examination under anesthesia Surgeon: Dr. Nicki Reaper [facility]  The patient has the above diagnosis and consented the above procedure.  The pain had improved to a certain degree and the patient did look more comfortable when I saw her today.  She understood that she had a positive urine culture and I was giving her ciprofloxacin now and will send her home on ciprofloxacin.  We talked about the safety of going ahead with the hydrodistention and rare risks.  We also were balancing the fact that she stopped her blood thinner and she has been very uncomfortable and hopeful for improvement.  I agreed with her and we all agree that we would go ahead with the hydrodistention but it would not do any instillation therapy  Under anesthesia she had excellent vaginal length.  She had a small grade 2 cystocele.  No foreign body.  No vaginitis.  No mesh extrusion  Patient underwent flexible cystoscopy using 23 French cystoscope.  Bladder mucosa and trigone were normal.  There was no evidence of acute cystitis.  She was hydrodistended the 750 mL for approximately 4 or 5 minutes.  On reinspection there were no glomerulations or findings in keeping with interstitial cystitis.  No bladder injury.  Bladder was emptied and she was taken to recovery

## 2019-10-05 NOTE — Anesthesia Postprocedure Evaluation (Signed)
Anesthesia Post Note  Patient: Ashley Lawson  Procedure(s) Performed: CYSTOSCOPY/HYDRODISTENSION (N/A Bladder)     Patient location during evaluation: PACU Anesthesia Type: General Level of consciousness: awake and alert Pain management: pain level controlled Vital Signs Assessment: post-procedure vital signs reviewed and stable Respiratory status: spontaneous breathing, nonlabored ventilation, respiratory function stable and patient connected to nasal cannula oxygen Cardiovascular status: blood pressure returned to baseline and stable Postop Assessment: no apparent nausea or vomiting Anesthetic complications: no   No complications documented.  Last Vitals:  Vitals:   10/05/19 1230 10/05/19 1235  BP: (!) 122/59 139/90  Pulse: 64 67  Resp: 14 16  Temp:  36.4 C  SpO2: 100% 100%    Last Pain:  Vitals:   10/05/19 1235  TempSrc:   PainSc: 0-No pain                 Barnet Glasgow

## 2019-10-06 ENCOUNTER — Encounter (HOSPITAL_COMMUNITY): Payer: Self-pay | Admitting: Urology

## 2019-11-03 ENCOUNTER — Other Ambulatory Visit: Payer: Self-pay | Admitting: Internal Medicine

## 2019-11-03 NOTE — Telephone Encounter (Signed)
Last OV 09/21/2019  Last filled 09/30/2019, # 24 with 0 refills

## 2019-11-18 ENCOUNTER — Ambulatory Visit: Payer: Medicare Other | Admitting: Physician Assistant

## 2019-11-18 ENCOUNTER — Encounter: Payer: Self-pay | Admitting: Physician Assistant

## 2019-11-18 VITALS — BP 128/62 | HR 69 | Ht 60.0 in | Wt 157.1 lb

## 2019-11-18 DIAGNOSIS — R1032 Left lower quadrant pain: Secondary | ICD-10-CM

## 2019-11-18 DIAGNOSIS — K5909 Other constipation: Secondary | ICD-10-CM | POA: Diagnosis not present

## 2019-11-18 DIAGNOSIS — K5792 Diverticulitis of intestine, part unspecified, without perforation or abscess without bleeding: Secondary | ICD-10-CM

## 2019-11-18 MED ORDER — DOXYCYCLINE MONOHYDRATE 100 MG PO TABS: 100.0000 mg | ORAL_TABLET | Freq: Two times a day (BID) | ORAL | 0 refills | Status: AC

## 2019-11-18 NOTE — Patient Instructions (Signed)
If you are age 84 or older, your body mass index should be between 23-30. Your Body mass index is 30.69 kg/m. If this is out of the aforementioned range listed, please consider follow up with your Primary Care Provider.  If you are age 71 or younger, your body mass index should be between 19-25. Your Body mass index is 30.69 kg/m. If this is out of the aformentioned range listed, please consider follow up with your Primary Care Provider.   START doxycycline 100 mg 1 tablet twice a day for 7 days START Miralax 1 capful in 8 ounces of water/juice twice a day.  Increase daily fluid intake.  Call the office in 2 weeks and ask to speak with [facility] if you are not feeling better.

## 2019-11-18 NOTE — Progress Notes (Signed)
Subjective:    Patient ID: Ashley Lawson, female    DOB: 03-30-1933, 86 y.o.   MRN: [id number]  HPI Ashley Lawson is a pleasant 86 year old white female, established with Dr. Carlean Purl who comes in today with concerns for a diverticulitis flare.  She was last seen in April 2021.  She has diagnosis of IBS and chronic left lower quadrant pain.  She had colonoscopy last in 2016 due to family history of colon cancer and was found to have 3 sessile polyps all around 6 mm, moderate diverticulosis in the descending and sigmoid colon with associated hypertrophy and tortuosity. She has been periodically treated with doxycycline for low-grade diverticulitis which has seemed to help her.  She had also been referred to pelvic PT which she said helped somewhat with her constipation and lower abdominal pain.  She has continued to do some of those exercises daily. She has recently had urologic evaluation is felt to have chronic cystitis and had a cystoscopy with bladder hydrodistention in July 2021.  She is now on low-dose Macrobid chronically. She continues to struggle with constipation and says she gets abdominal pain if she does not have a bowel movement.  She had to take Dulcolax yesterday did have a good bowel movement felt better but then she feels her symptoms starting to "build back up.  She had not been taking any regular laxative. Her daughter says that it will take her a couple of hours each morning to get herself going and be comfortable, and does not feel bad until she can have a bowel movement. She has not particularly had significant worsening of her left lower quadrant pain over the past few weeks but says she is tender in that area.  Review of Systems Pertinent positive and negative review of systems were noted in the above HPI section.  All other review of systems was otherwise negative.  Outpatient Encounter Medications as of 11/18/2019  Medication Sig  . acetaminophen (TYLENOL) 500 MG tablet Take 500 mg  by mouth. Three a week  . Ascorbic Acid (VITAMIN C) 100 MG tablet Take 500 mg by mouth daily.   . calcium carbonate (OS-CAL) 1250 (500 Ca) MG chewable tablet Chew 1 tablet by mouth daily.   Marland Kitchen diltiazem (CARDIZEM CD) 120 MG 24 hr capsule TAKE 1 CAPSULE BY MOUTH EVERY DAY  . ELIQUIS 5 MG TABS tablet TAKE 1 TABLET BY MOUTH TWICE A DAY  . guaiFENesin (MUCINEX) 600 MG 12 hr tablet Take 600 mg by mouth daily.   Marland Kitchen loratadine (CLARITIN) 10 MG tablet Take 10 mg by mouth every other day.   Marland Kitchen LORazepam (ATIVAN) 0.5 MG tablet Take 0.5 mg by mouth every other day.  . magnesium hydroxide (MILK OF MAGNESIA) 400 MG/5ML suspension Take 15 mLs by mouth daily as needed for mild constipation.  . Multiple Vitamin (MULTIVITAMIN) tablet Take 1 tablet by mouth daily.  . nitrofurantoin, macrocrystal-monohydrate, (MACROBID) 100 MG capsule Take 100 mg by mouth daily.  Marland Kitchen PREMARIN vaginal cream INSERT 1 GRAM VAGINALLY THREE TIMES A WEEKLY, THEN ONCE WEEKLY  . Probiotic Product (ALIGN) 4 MG CAPS Take 1 capsule by mouth daily.  . RESTASIS 0.05 % ophthalmic emulsion Place 1 drop into both eyes 2 (two) times daily.  Marland Kitchen triamcinolone (NASACORT) 55 MCG/ACT AERO nasal inhaler SMARTSIG:2 Spray(s) Both Nares Every Night  . doxycycline (ADOXA) 100 MG tablet Take 1 tablet (100 mg total) by mouth 2 (two) times daily for 7 days.   No facility-administered encounter medications on  file as of 11/18/2019.   Allergies  Allergen Reactions  . Alendronate Sodium     upset stomach  . Penicillins     Has patient had a PCN reaction causing immediate rash, facial/tongue/throat swelling, SOB or lightheadedness with hypotension: Yes Has patient had a PCN reaction causing severe rash involving mucus membranes or skin necrosis: No Has patient had a PCN reaction that required hospitalization: No Has patient had a PCN reaction occurring within the last 10 years: No If all of the above answers are "NO", then may proceed with Cephalosporin use.   Marland Kitchen  Amoxicillin Rash    Has patient had a PCN reaction causing immediate rash, facial/tongue/throat swelling, SOB or lightheadedness with hypotension: yes Has patient had a PCN reaction causing severe rash involving mucus membranes or skin necrosis: no Has patient had a PCN reaction that required hospitalization: no Has patient had PCN reaction within the last 10 years: no  If all of the above answers are "NO", then may proceed with Cephalosporin use.  REACTION: unspecified  . Metronidazole Other (See Comments)    Pounding headaches patient says was bad.  With cipro for diverticulitis rx. Other reaction(s): Other (See Comments) Pounding headaches patient says was bad.With cipro for diverticulitis rx.   Patient Active Problem List   Diagnosis Date Noted  . Syncope 06/17/2019  . Seasonal allergies 06/17/2019  . DDD (degenerative disc disease), lumbar   . Proctocolitis   . CKD (chronic kidney disease), stage III   . Internal hemorrhoids   . Dizziness 09/17/2018  . Abdominal pain 09/17/2018  . AF (paroxysmal atrial fibrillation) ([place]) 09/17/2018  . Chest discomfort 05/13/2018  . Coagulation defect ([place]) 01/30/2018  . Deviated septum 01/30/2018  . Epistaxis, recurrent 01/30/2018  . Atrial fibrillation with RVR ([place]) 05/02/2017  . Rhinitis, allergic 05/31/2015  . Renal cyst incidental  01/19/2015  . Heartburn 01/14/2014  . GERD (gastroesophageal reflux disease) 01/14/2014  . Osteoporosis 01/14/2014  . IBS (irritable bowel syndrome)   . Nail, ingrown 06/22/2012  . Arthritis 02/19/2012  . Screening, lipid 02/18/2011  . Rash and nonspecific skin eruption 02/18/2011  . Visit for preventive health examination 02/18/2011  . Skin cancer 10/14/2010  . Abdominal bloating 10/08/2010  . Chronic constipation 01/15/2010  . Lower urinary tract symptoms (LUTS) 01/15/2010  . SUPRAPUBIC PAIN 08/01/2009  . ABDOMINAL PAIN, EPIGASTRIC 06/16/2009  . DIVERTICULOSIS, COLON 06/07/2007  .  Hyperlipidemia 06/05/2007  . ANXIETY STATE, UNSPECIFIED 06/05/2007  . Allergic rhinitis 06/05/2007  . IBS 06/05/2007  . OSTEOPOROSIS 06/05/2007  . ABDOMINAL PAIN, GENERALIZED 06/05/2007   Social History   Socioeconomic History  . Marital status: Widowed    Spouse name: Not on file  . Number of children: Not on file  . Years of education: Not on file  . Highest education level: Not on file  Occupational History  . Occupation: retired  Tobacco Use  . Smoking status: Never Smoker  . Smokeless tobacco: Never Used  Vaping Use  . Vaping Use: Never used  Substance and Sexual Activity  . Alcohol use: Yes    Alcohol/week: 0.0 standard drinks    Comment: occ  . Drug use: Never  . Sexual activity: Not Currently  Other Topics Concern  . Not on file  Social History Narrative   Retired   Regular exercise- yes   Widowed   At home with children and GKs    HH of 2     Puppy   Plays cards is social and active  Diet:  No      Do you drink/ eat things with caffeine? Yes      Marital status:   Widow                            What year were you married ? [id number]      Do you live in a house, apartment,assistred living, condo, trailer, etc.)? Townhouse      Is it one or more stories? 2      How many persons live in your home ? 2      Do you have any pets in your home ?(please list)  None      Highest Level of education completed:  12 th Grade      Current or past profession: Retail Sales @ [employer]      Do you exercise?    Some                          Type & how often  Walking      ADVANCED DIRECTIVES (Please bring copies)      Do you have a living will? Yes      Do you have a DNR form?  Yes                     If not, do you want to discuss one?       Do you have signed POA?HPOA forms? Yes                If so, please bring to your appointment      FUNCTIONAL STATUS- To be completed by Spouse / child / Staff       Do you have difficulty bathing or dressing yourself  ? No      Do you have difficulty preparing food or eating ?  No      Do you have difficulty managing your mediation ? No      Do you have difficulty managing your finances ? No      Do you have difficulty affording your medication ? No      Social Determinants of Health   Financial Resource Strain:   . Difficulty of Paying Living Expenses: Not on file  Food Insecurity:   . Worried About Charity fundraiser in the Last Year: Not on file  . Ran Out of Food in the Last Year: Not on file  Transportation Needs:   . Lack of Transportation (Medical): Not on file  . Lack of Transportation (Non-Medical): Not on file  Physical Activity:   . Days of Exercise per Week: Not on file  . Minutes of Exercise per Session: Not on file  Stress:   . Feeling of Stress : Not on file  Social Connections:   . Frequency of Communication with Friends and Family: Not on file  . Frequency of Social Gatherings with Friends and Family: Not on file  . Attends Religious Services: Not on file  . Active Member of Clubs or Organizations: Not on file  . Attends Archivist Meetings: Not on file  . Marital Status: Not on file  Intimate Partner Violence:   . Fear of Current or Ex-Partner: Not on file  . Emotionally Abused: Not on file  . Physically Abused: Not on file  . Sexually Abused: Not on file    Ms. Overby's  family history includes Alzheimer's disease (age of onset: 30) in her sister; Bladder Cancer (age of onset: 27) in her sister; Crohn's disease in her son; Heart attack (age of onset: 28) in her father; Liver disease (age of onset: 33) in her brother; Stroke (age of onset: 27) in her mother.      Objective:    Vitals:   11/18/19 1055  BP: 128/62  Pulse: 69    Physical Exam Well-developed well-nourished elderly WF in no acute distress.  Accompanied by her daughter  height, Weight, 157 BMI 30.69  HEENT; nontraumatic normocephalic, EOMI, PE R LA, sclera anicteric. Oropharynx; not  examined Neck; supple, no JVD Cardiovascular; regular rate and rhythm with S1-S2, no murmur rub or gallop Pulmonary; Clear bilaterally Abdomen; soft, there is some tenderness in the left mid and left lower quadrant, no guarding nondistended, no palpable mass or hepatosplenomegaly, bowel sounds are active Rectal; not done today Skin; benign exam, no jaundice rash or appreciable lesions Extremities; no clubbing cyanosis or edema skin warm and dry Neuro/Psych; alert and oriented x4, grossly nonfocal mood and affect appropriate       Assessment & Plan:   #71 86 year old white female with chronic complaints of left lower quadrant pain felt secondary to component of IBS.  She has had some recent increase in left lower abdominal pain and constipation.  Pain is exacerbated by inability to have a bowel movement. She does have significant diverticular disease in the descending and sigmoid colon with tortuosity and hypertrophy.  I suspect she has symptomatic diverticulosis, and ongoing symptoms secondary to luminal narrowing from diverticular disease.  Plan; we will start MiraLAX 17 g twice daily in 8 ounces of water. She can use Dulcolax 2 to 3 tablets as needed if she is not having good results with MiraLAX. We will give her a 7-day course of doxycycline 100 mg p.o. twice daily. I have asked her to call back in about 2 weeks if she is not feeling improvement in symptoms with above regimen.  We could consider trial of Linzess or Amitiza at that point, and/or imaging if pain has not improved.  Delina Kruczek Genia Harold PA-C 11/18/2019   Cc: Burnis Medin, MD

## 2019-11-29 ENCOUNTER — Other Ambulatory Visit: Payer: Self-pay

## 2019-11-29 ENCOUNTER — Ambulatory Visit (INDEPENDENT_AMBULATORY_CARE_PROVIDER_SITE_OTHER): Payer: Medicare Other | Admitting: Internal Medicine

## 2019-11-29 ENCOUNTER — Encounter: Payer: Self-pay | Admitting: Internal Medicine

## 2019-11-29 VITALS — BP 128/78 | HR 66 | Temp 97.5°F | Ht 60.0 in | Wt 157.6 lb

## 2019-11-29 DIAGNOSIS — I480 Paroxysmal atrial fibrillation: Secondary | ICD-10-CM

## 2019-11-29 DIAGNOSIS — M810 Age-related osteoporosis without current pathological fracture: Secondary | ICD-10-CM | POA: Diagnosis not present

## 2019-11-29 DIAGNOSIS — D6869 Other thrombophilia: Secondary | ICD-10-CM | POA: Insufficient documentation

## 2019-11-29 DIAGNOSIS — I4891 Unspecified atrial fibrillation: Secondary | ICD-10-CM

## 2019-11-29 DIAGNOSIS — N1831 Chronic kidney disease, stage 3a: Secondary | ICD-10-CM

## 2019-11-29 DIAGNOSIS — K5902 Outlet dysfunction constipation: Secondary | ICD-10-CM

## 2019-11-29 DIAGNOSIS — E785 Hyperlipidemia, unspecified: Secondary | ICD-10-CM | POA: Diagnosis not present

## 2019-11-29 DIAGNOSIS — R339 Retention of urine, unspecified: Secondary | ICD-10-CM

## 2019-11-29 DIAGNOSIS — F419 Anxiety disorder, unspecified: Secondary | ICD-10-CM | POA: Diagnosis not present

## 2019-11-29 MED ORDER — CITALOPRAM HYDROBROMIDE 10 MG PO TABS: 10.0000 mg | ORAL_TABLET | Freq: Every day | ORAL | 3 refills | Status: DC

## 2019-11-29 NOTE — Patient Instructions (Addendum)
Let's start a low dose of citalopram.   In about 6 weeks, we can meet again and see if you still need the lorazepam. Try to get on the treadmill starting at 5 minutes twice a day.   Drink 6 8oz glasses of water per day.   Try the miralax twice a day as they suggested at Gastroenterology.   I suggest you get back to doing the pelvic PT exercises.

## 2019-11-29 NOTE — Progress Notes (Signed)
Provider:  Rexene Edison. Mariea Clonts, D.O., C.M.D. Location:   [place]   Place of Service:   clinic  Previous PCP: Gayland Curry, DO Patient Care Team: Gayland Curry, DO as PCP - General (Geriatric Medicine) Burnell Blanks, MD as PCP - Cardiology (Cardiology) Bjorn Loser, MD (Urology) Particia Nearing, MD (Dermatology) Luberta Mutter, MD (Ophthalmology) Almedia Balls, MD (Orthopedic Surgery) Ouida Sills  (Rheumatology) Jarome Matin, MD as Consulting Physician (Dermatology) Gatha Mayer, MD as Consulting Physician (Gastroenterology) Rozetta Nunnery, MD as Consulting Physician (Otolaryngology)  Extended Emergency Contact Information Primary Emergency Contact: Apple,Karen Address: [address]          [place] [address] [name] of [place] Phone: [phone] Work Phone: [phone] Mobile Phone: [phone] Relation: Daughter  Goals of Care: Advanced Directive information Advanced Directives 11/29/2019  Does Patient Have a Medical Advance Directive? Yes  Type of Advance Directive Living will;Healthcare Power of Attorney  Does patient want to make changes to medical advance directive? No - Patient declined  Would patient like information on creating a medical advance directive? -    Chief Complaint  Patient presents with  . Establish Care    New Patient   . Health Maintenance    influenza  high dose still out of stock    HPI: Patient is a 86 y.o. female seen today to establish with [facility].  She has a h/o afib, constipation, bladder control issues, diverticulosis, osteoporosis, lumbar DDD, hyperlipidemia among others.  Records are in epic from [place] at [facility].  They felt she needed a geriatrician.   Says she was fine until she got in her 86s.  Got afib.  Takes eliquis.    Used to take prune juice.  Says she had constipation even before she got old.  Now she has trouble going.  Her daughter says pt feels like she has to go  every day.  She's been told it's better to go daily to keep her bladder emptying properly Prune juice, grape juice, miralax, occasional dulcolax and stays in the house until 1pm.  Her daughter thinks she's obsessing over it. Gets gassy, but no pain outright if does not go daily.   She saw Dr. Celesta Aver PA last week.  She has pockets on the left side/diverticular chronically.  So may have that discomfort.   She eats only small amounts, but then stays hungry and snacks a lot.  She will eat a 1/4 of a plate if they go out.   She says she thinks she will get better at drinking water.    She had pelvic PT which helped her.    She plays cards with her neighbors.    Dr. Matilde Sprang gave her a low dose antibiotic for her frequent UTIs.  She had her bladder stretched 2x in 5 yrs.  She was not emptying her bladder well.  She might have to keep straining to go.  Has some leakage and wears a pad.  Last saw [facility] in July.    She takes lorazepam every other night.  She gets very nervous.  She will get to where she can't sleep at night.  She's also been very careful staying home and that's driving her nuts.  Used to run around and do what she wanted to do.  She used to be on xanax in the past.  She had been missing appts b/c she was so upset.    Her legs are getting weak from not doing anything.  She  says her daughter walks too fast.  She is not walking as much.    Senile osteoporosis:  Took fosamax and had stomach upset.  She's never taken anything else.  prolia was suggested by Dr. Regis Bill.  She has had TMJ dysfunction.  Has trouble chewing.   She has dentures.  She fell coming in from the mailbox in march.  She tripped over the coat rack and fell against it and hit her ribs against the dining room table.  She had vertigo and was hospitalized.  No falls then.  Used a cane temporarily. Had home health therapy and nursing.    She does have some short-term memory loss.    When her husband passed away in Jun 22, 2003,  her daughter moved in with her.  She says now she needs her.    Past Medical History:  Diagnosis Date  . Abdominal pain 12/2009   hospitalized  . Allergic rhinitis   . Anxiety   . Bladder problem   . Chronic constipation   . Compression fx, lumbar spine ([place])   . DDD (degenerative disc disease), lumbar   . Diverticulosis of colon   . Dog bite(E906.0) 08/30/2011  . Dysrhythmia   . GERD (gastroesophageal reflux disease)   . Hepatic steatosis    pt denies  . Hyperlipidemia   . Hyperplastic colon polyp   . IBS (irritable bowel syndrome)    constipation predominant  . Internal hemorrhoids   . Osteoporosis    dexa 06-21-2009 -2.7 hip nl spine  . PAF (paroxysmal atrial fibrillation) ([facility])    a. dx 06/21/2017.  Marland Kitchen PMR (polymyalgia rheumatica) (HCC) 08/30/2011   under rheum care  on low dose pred 5 mg   . Renal disorder    Past Surgical History:  Procedure Laterality Date  . ABDOMINAL HYSTERECTOMY     with bladder tact and rectocele repair  . BLADDER SURGERY     Streched  . CATARACT EXTRACTION  2008-06-21   both  . COLONOSCOPY  2014/06/21 was most recent  . CYSTO WITH HYDRODISTENSION N/A 10/05/2019   Procedure: CYSTOSCOPY/HYDRODISTENSION;  Surgeon: Bjorn Loser, MD;  Location: [facility];  Service: Urology;  Laterality: N/A;  . TONSILLECTOMY AND ADENOIDECTOMY      Social History   Socioeconomic History  . Marital status: Widowed    Spouse name: Not on file  . Number of children: Not on file  . Years of education: Not on file  . Highest education level: Not on file  Occupational History  . Occupation: retired  Tobacco Use  . Smoking status: Never Smoker  . Smokeless tobacco: Never Used  Vaping Use  . Vaping Use: Never used  Substance and Sexual Activity  . Alcohol use: Yes    Alcohol/week: 0.0 standard drinks    Comment: occ  . Drug use: Never  . Sexual activity: Not Currently  Other Topics Concern  . Not on file  Social History Narrative   Retired   Regular exercise- yes   Widowed    At home with children and GKs    HH of 2     Puppy   Plays cards is social and active       Diet:  No      Do you drink/ eat things with caffeine? Yes      Marital status:   Widow  What year were you married ? [id number]      Do you live in a house, apartment,assistred living, condo, trailer, etc.)? Townhouse      Is it one or more stories? 2      How many persons live in your home ? 2      Do you have any pets in your home ?(please list)  None      Highest Level of education completed:  12 th Grade      Current or past profession: Retail Sales @ [employer]      Do you exercise?    Some                          Type & how often  Walking      ADVANCED DIRECTIVES (Please bring copies)      Do you have a living will? Yes      Do you have a DNR form?  Yes                     If not, do you want to discuss one?       Do you have signed POA?HPOA forms? Yes                If so, please bring to your appointment      FUNCTIONAL STATUS- To be completed by Spouse / child / Staff       Do you have difficulty bathing or dressing yourself ? No      Do you have difficulty preparing food or eating ?  No      Do you have difficulty managing your mediation ? No      Do you have difficulty managing your finances ? No      Do you have difficulty affording your medication ? No      Social Determinants of Health   Financial Resource Strain:   . Difficulty of Paying Living Expenses: Not on file  Food Insecurity:   . Worried About Charity fundraiser in the Last Year: Not on file  . Ran Out of Food in the Last Year: Not on file  Transportation Needs:   . Lack of Transportation (Medical): Not on file  . Lack of Transportation (Non-Medical): Not on file  Physical Activity:   . Days of Exercise per Week: Not on file  . Minutes of Exercise per Session: Not on file  Stress:   . Feeling of Stress : Not on file  Social Connections:   . Frequency of Communication  with Friends and Family: Not on file  . Frequency of Social Gatherings with Friends and Family: Not on file  . Attends Religious Services: Not on file  . Active Member of Clubs or Organizations: Not on file  . Attends Archivist Meetings: Not on file  . Marital Status: Not on file    reports that she has never smoked. She has never used smokeless tobacco. She reports current alcohol use. She reports that she does not use drugs.  Functional Status Survey:    Family History  Problem Relation Age of Onset  . Stroke Mother 64  . Liver disease Brother 45  . Heart attack Father 2  . Alzheimer's disease Sister 8  . Bladder Cancer Sister 26  . Crohn's disease Son   . Colon cancer Neg Hx   . Esophageal cancer Neg Hx   .  Stomach cancer Neg Hx   . Rectal cancer Neg Hx   . Liver cancer Neg Hx     Health Maintenance  Topic Date Due  . INFLUENZA VACCINE  10/31/2019  . MAMMOGRAM  06/29/2021  . TETANUS/TDAP  08/29/2021  . DEXA SCAN  Completed  . COVID-19 Vaccine  Completed  . PNA vac Low Risk Adult  Completed    Allergies  Allergen Reactions  . Alendronate Sodium     upset stomach  . Penicillins     Has patient had a PCN reaction causing immediate rash, facial/tongue/throat swelling, SOB or lightheadedness with hypotension: Yes Has patient had a PCN reaction causing severe rash involving mucus membranes or skin necrosis: No Has patient had a PCN reaction that required hospitalization: No Has patient had a PCN reaction occurring within the last 10 years: No If all of the above answers are "NO", then may proceed with Cephalosporin use.   Marland Kitchen Amoxicillin Rash    Has patient had a PCN reaction causing immediate rash, facial/tongue/throat swelling, SOB or lightheadedness with hypotension: yes Has patient had a PCN reaction causing severe rash involving mucus membranes or skin necrosis: no Has patient had a PCN reaction that required hospitalization: no Has patient had PCN  reaction within the last 10 years: no  If all of the above answers are "NO", then may proceed with Cephalosporin use.  REACTION: unspecified  . Metronidazole Other (See Comments)    Pounding headaches patient says was bad.  With cipro for diverticulitis rx. Other reaction(s): Other (See Comments) Pounding headaches patient says was bad.With cipro for diverticulitis rx.    Outpatient Encounter Medications as of 11/29/2019  Medication Sig  . acetaminophen (TYLENOL) 500 MG tablet Take 500 mg by mouth. Three a week  . Ascorbic Acid (VITAMIN C) 100 MG tablet Take 500 mg by mouth daily.   . calcium carbonate (OS-CAL) 1250 (500 Ca) MG chewable tablet Chew 1 tablet by mouth daily.   Marland Kitchen diltiazem (CARDIZEM CD) 120 MG 24 hr capsule TAKE 1 CAPSULE BY MOUTH EVERY DAY  . ELIQUIS 5 MG TABS tablet TAKE 1 TABLET BY MOUTH TWICE A DAY  . guaiFENesin (MUCINEX) 600 MG 12 hr tablet Take 600 mg by mouth daily.   Marland Kitchen loratadine (CLARITIN) 10 MG tablet Take 10 mg by mouth every other day.   Marland Kitchen LORazepam (ATIVAN) 0.5 MG tablet Take 0.5 mg by mouth every other day.  . magnesium hydroxide (MILK OF MAGNESIA) 400 MG/5ML suspension Take 15 mLs by mouth daily as needed for mild constipation.  . Multiple Vitamin (MULTIVITAMIN) tablet Take 1 tablet by mouth daily.  . nitrofurantoin, macrocrystal-monohydrate, (MACROBID) 100 MG capsule Take 100 mg by mouth daily.  Marland Kitchen PREMARIN vaginal cream INSERT 1 GRAM VAGINALLY THREE TIMES A WEEKLY, THEN ONCE WEEKLY  . Probiotic Product (ALIGN) 4 MG CAPS Take 1 capsule by mouth daily.  . RESTASIS 0.05 % ophthalmic emulsion Place 1 drop into both eyes 2 (two) times daily.  Marland Kitchen triamcinolone (NASACORT) 55 MCG/ACT AERO nasal inhaler SMARTSIG:2 Spray(s) Both Nares Every Night  . citalopram (CELEXA) 10 MG tablet Take 1 tablet (10 mg total) by mouth daily.   No facility-administered encounter medications on file as of 11/29/2019.    Review of Systems  Constitutional: Negative for chills and  fever.  HENT:       Dentures, left tmj dysfunction  Eyes: Negative for blurred vision.       Dry eyes  Respiratory: Negative for cough and shortness of  breath.   Cardiovascular: Negative for chest pain and palpitations.  Gastrointestinal: Positive for abdominal pain and constipation.       H/o diverticulitis; chronic intermittent LLQ pain  Genitourinary: Negative for dysuria.       A little leakage  Musculoskeletal: Positive for back pain. Negative for falls.  Skin: Negative for rash.  Neurological: Negative for dizziness, loss of consciousness and headaches.  Endo/Heme/Allergies: Positive for environmental allergies.  Psychiatric/Behavioral: Positive for memory loss. Negative for depression. The patient is nervous/anxious. The patient does not have insomnia.     Vitals:   11/29/19 1325  BP: 128/78  Pulse: 66  Temp: (!) 97.5 F (36.4 C)  TempSrc: Temporal  SpO2: 99%  Weight: 157 lb 9.6 oz (71.5 kg)  Height: 5' (1.524 m)   Body mass index is 30.78 kg/m. Physical Exam Vitals reviewed.  Constitutional:      General: She is not in acute distress.    Appearance: Normal appearance. She is not ill-appearing or toxic-appearing.  HENT:     Head: Normocephalic and atraumatic.     Right Ear: External ear normal.     Left Ear: External ear normal.     Ears:     Comments: HOH    Nose: Nose normal.     Mouth/Throat:     Pharynx: Oropharynx is clear.     Comments: Left TMJ dysfunction Eyes:     Extraocular Movements: Extraocular movements intact.     Pupils: Pupils are equal, round, and reactive to light.  Cardiovascular:     Rate and Rhythm: Normal rate and regular rhythm.     Pulses: Normal pulses.     Heart sounds: Normal heart sounds. No murmur heard.   Pulmonary:     Effort: Pulmonary effort is normal.     Breath sounds: Normal breath sounds. No wheezing, rhonchi or rales.  Abdominal:     General: Bowel sounds are normal.     Palpations: Abdomen is soft.      Tenderness: There is no abdominal tenderness.  Musculoskeletal:        General: Normal range of motion.     Cervical back: Neck supple. No rigidity.     Right lower leg: No edema.     Left lower leg: No edema.  Lymphadenopathy:     Cervical: No cervical adenopathy.  Skin:    General: Skin is warm and dry.  Neurological:     General: No focal deficit present.     Mental Status: She is alert and oriented to person, place, and time.  Psychiatric:        Mood and Affect: Mood normal.        Behavior: Behavior normal.        Thought Content: Thought content normal.        Judgment: Judgment normal.     Labs reviewed: Basic Metabolic Panel: Recent Labs    06/17/19 1422 06/17/19 1947 06/18/19 0326 06/19/19 0353 08/02/19 2035  NA   < >  --  139 137 131*  K   < >  --  3.2* 3.8 4.2  CL   < >  --  105 105 98  CO2   < >  --  23 24 23   GLUCOSE   < >  --  91 83 96  BUN   < >  --  6* 7* 7*  CREATININE   < >  --  0.86 0.86 0.81  CALCIUM   < >  --  8.6* 8.6* 9.4  MG  --  1.6*  --   --   --    < > = values in this interval not displayed.   Liver Function Tests: Recent Labs    06/17/19 1422 06/18/19 0326 08/02/19 2035  AST 25 17 28   ALT 22 16 22   ALKPHOS 60 48 61  BILITOT 0.8 0.8 0.5  PROT 6.6 5.4* 6.8  ALBUMIN 3.8 3.0* 4.0   Recent Labs    06/17/19 1422 08/02/19 2035  LIPASE 45 30   No results for input(s): AMMONIA in the last 8760 hours. CBC: Recent Labs    01/18/19 1555 01/18/19 1555 04/07/19 1552 04/07/19 1552 06/17/19 1422 06/18/19 0326 06/19/19 0353 08/02/19 2035 09/29/19 1454  WBC 8.1   < > 9.5   < > 11.9*   < > 5.8 7.0 9.1  NEUTROABS 4.8  --  7.0  --  9.3*  --   --   --   --   HGB 13.5   < > 13.3   < > 13.7   < > 12.1 13.5 13.6  HCT 40.3   < > 39.3   < > 42.0   < > 36.7 39.3 41.3  MCV 95.3   < > 95.9   < > 98.4   < > 96.8 94.9 97.2  PLT 247.0   < > 241.0   < > 249   < > 217 286 298   < > = values in this interval not displayed.   Cardiac  Enzymes: No results for input(s): CKTOTAL, CKMB, CKMBINDEX, TROPONINI in the last 8760 hours. BNP: Invalid input(s): POCBNP No results found for: HGBA1C Lab Results  Component Value Date   TSH 2.39 04/07/2019   No results found for: VITAMINB12 No results found for: FOLATE No results found for: IRON, TIBC, FERRITIN  Imaging and Procedures noted on new patient packet: Bone density 04/21/19  Hearing test Dr. Lucia Gaskins 06/11/19 Mammogram breast center 06/30/19 CTs multiple during 2020 hospitalization for vertigo 09/02/19 CT abd[facility]pelvis at [facility] when hospitalized w/ diverticulitis  Assessment/Plan 1. Anxiety Let's start a low dose of citalopram.   In about 6 weeks, we can meet again and see if you still need the lorazepam.  - citalopram (CELEXA) 10 MG tablet; Take 1 tablet (10 mg total) by mouth daily.  Dispense: 30 tablet; Refill: 3  2. Constipation, outlet dysfunction Try to get on the treadmill starting at 5 minutes twice a day.   Drink 6 8oz glasses of water per day.   Try the miralax twice a day as they suggested at Gastroenterology.   I suggest you get back to doing the pelvic PT exercises.    3. Hyperlipidemia, unspecified hyperlipidemia type Lab Results  Component Value Date   LDLCALC 105 (H) 04/08/2018  not on meds and considering no CAD history, goal is less than 100 so 105 was not bad  4. Age-related osteoporosis without current pathological fracture -cont ca with D, get back to a walking program slowly  5. Incomplete bladder emptying -sounds like related to constipation, get back to PT exercises for pelvic floor/outlet obstruction, cont nitrofurantoin prophylaxis per urology - will address more over time   6. AF (paroxysmal atrial fibrillation) (HCC) -cont eliquis, cardizem for rate control  7. Hypercoagulable state due to atrial fibrillation ([place]) -doing fine on eliquis  8. Stage 3a chronic kidney disease -Avoid nephrotoxic agents like nsaids, dose adjust renally  excreted meds, hydrate.  Labs/tests ordered:  No new;  reviewed old labs from prior PCP  F/u in 6 wks on anxiety and constipation   Kaileen Bronkema L. Fae Blossom, D.O. [facility] [address], [place] [address] Cell Phone (Mon-Fri 8am-5pm):  [phone] On Call:  [phone] & follow prompts after 5pm & weekends Office Phone:  [phone] Office Fax:  [phone]

## 2019-12-08 ENCOUNTER — Telehealth: Payer: Self-pay | Admitting: *Deleted

## 2019-12-08 NOTE — Telephone Encounter (Signed)
Patient daughter, Santiago Glad called and stated that patient was prescribed Citalopram for her anxiety. Stated that patient took it for 5 nights and it gave her a Headache, Dizziness and felt bad and she bite her tongue during the night.   Patient has STOPPED taking it. Stated that she stopped it on Sunday and her symptoms have improved.   Please Advise.

## 2019-12-08 NOTE — Telephone Encounter (Signed)
Ok to stay off celexa.  I believe she has another appt coming up that was to follow-up on her depression.

## 2019-12-09 NOTE — Telephone Encounter (Signed)
Patient notified and agreed.  

## 2019-12-21 ENCOUNTER — Other Ambulatory Visit: Payer: Self-pay | Admitting: Internal Medicine

## 2019-12-21 ENCOUNTER — Other Ambulatory Visit: Payer: Self-pay | Admitting: Family Medicine

## 2019-12-21 DIAGNOSIS — F419 Anxiety disorder, unspecified: Secondary | ICD-10-CM

## 2020-01-10 ENCOUNTER — Other Ambulatory Visit: Payer: Self-pay

## 2020-01-10 ENCOUNTER — Ambulatory Visit (INDEPENDENT_AMBULATORY_CARE_PROVIDER_SITE_OTHER): Payer: Medicare Other | Admitting: Internal Medicine

## 2020-01-10 ENCOUNTER — Encounter: Payer: Self-pay | Admitting: Internal Medicine

## 2020-01-10 VITALS — BP 122/82 | HR 78 | Temp 97.7°F | Ht 60.0 in | Wt 155.8 lb

## 2020-01-10 DIAGNOSIS — K5902 Outlet dysfunction constipation: Secondary | ICD-10-CM

## 2020-01-10 DIAGNOSIS — M810 Age-related osteoporosis without current pathological fracture: Secondary | ICD-10-CM

## 2020-01-10 DIAGNOSIS — F419 Anxiety disorder, unspecified: Secondary | ICD-10-CM

## 2020-01-10 DIAGNOSIS — Z23 Encounter for immunization: Secondary | ICD-10-CM | POA: Diagnosis not present

## 2020-01-10 MED ORDER — LORAZEPAM 0.5 MG PO TABS: 0.5000 mg | ORAL_TABLET | Freq: Every evening | ORAL | 0 refills | Status: DC | PRN

## 2020-01-10 MED ORDER — ESCITALOPRAM OXALATE 5 MG PO TABS: 5.0000 mg | ORAL_TABLET | Freq: Every day | ORAL | 1 refills | Status: DC

## 2020-01-10 NOTE — Progress Notes (Signed)
Location:  [facility] clinic Provider:  Theoden Mauch L. Mariea Clonts, D.O., C.M.D.  Goals of Care:  Advanced Directives 11/29/2019  Does Patient Have a Medical Advance Directive? Yes  Type of Advance Directive Living will;Healthcare Power of Attorney  Does patient want to make changes to medical advance directive? No - Patient declined  Would patient like information on creating a medical advance directive? -   Chief Complaint  Patient presents with  . Medical Management of Chronic Issues    Follow up on anxiety, Patient took Celexa  x 5 days made her dizzy,felt aweful, headache  she took it in the morning time. some constipation but not as bad  . Health Maintenance    influenza    HPI: Patient is a 86 y.o. female seen today for 6 week f/u--I started her on citalopram and was hoping we could wean her lorazepam for anxiety.  She resumed her lorazepam--one at night only--takes most nights.  She had headache and dizziness with the citalopram.    We had also discussed getting back to pelvic PT re: her outlet dysfunction constipation.  This has been better the past few weeks.  Taking a laxative twice a week if needed.    I'd also encouraged getting gradually back to walking.  Using the treadmill that her son got her.    Needs flu shot and covid booster.  Flu shot given today.   MMSE - Mini Mental State Exam 01/10/2020  Orientation to time 5  Orientation to Place 5  Registration 3  Attention/ Calculation 5  Recall 3  Language- name 2 objects 2  Language- repeat 1  Language- follow 3 step command 3  Language- read & follow direction 1  Write a sentence 1  Copy design 0  Copy design-comments no to clock  Total score 29    Past Medical History:  Diagnosis Date  . Abdominal pain 12/2009   hospitalized  . Allergic rhinitis   . Anxiety   . Bladder problem   . Chronic constipation   . Compression fx, lumbar spine ([place])   . DDD (degenerative disc disease), lumbar   . Diverticulosis of colon   .  Dog bite(E906.0) 08/30/2011  . Dysrhythmia   . GERD (gastroesophageal reflux disease)   . Hepatic steatosis    pt denies  . Hyperlipidemia   . Hyperplastic colon polyp   . IBS (irritable bowel syndrome)    constipation predominant  . Internal hemorrhoids   . Osteoporosis    dexa 2011 -2.7 hip nl spine  . PAF (paroxysmal atrial fibrillation) ([place])    a. dx 05/2017.  Marland Kitchen PMR (polymyalgia rheumatica) (HCC) 08/30/2011   under rheum care  on low dose pred 5 mg   . Renal disorder     Past Surgical History:  Procedure Laterality Date  . ABDOMINAL HYSTERECTOMY     with bladder tact and rectocele repair  . BLADDER SURGERY     Streched  . CATARACT EXTRACTION  2010   both  . COLONOSCOPY  2016 was most recent  . CYSTO WITH HYDRODISTENSION N/A 10/05/2019   Procedure: CYSTOSCOPY/HYDRODISTENSION;  Surgeon: Bjorn Loser, MD;  Location: [facility];  Service: Urology;  Laterality: N/A;  . TONSILLECTOMY AND ADENOIDECTOMY      Allergies  Allergen Reactions  . Alendronate Sodium     upset stomach  . Penicillins     Has patient had a PCN reaction causing immediate rash, facial/tongue/throat swelling, SOB or lightheadedness with hypotension: Yes Has patient had a  PCN reaction causing severe rash involving mucus membranes or skin necrosis: No Has patient had a PCN reaction that required hospitalization: No Has patient had a PCN reaction occurring within the last 10 years: No If all of the above answers are "NO", then may proceed with Cephalosporin use.   Marland Kitchen Amoxicillin Rash    Has patient had a PCN reaction causing immediate rash, facial/tongue/throat swelling, SOB or lightheadedness with hypotension: yes Has patient had a PCN reaction causing severe rash involving mucus membranes or skin necrosis: no Has patient had a PCN reaction that required hospitalization: no Has patient had PCN reaction within the last 10 years: no  If all of the above answers are "NO", then may proceed with Cephalosporin  use.  REACTION: unspecified  . Metronidazole Other (See Comments)    Pounding headaches patient says was bad.  With cipro for diverticulitis rx. Other reaction(s): Other (See Comments) Pounding headaches patient says was bad.With cipro for diverticulitis rx.    Outpatient Encounter Medications as of 01/10/2020  Medication Sig  . acetaminophen (TYLENOL) 500 MG tablet Take 500 mg by mouth. Three a week  . Ascorbic Acid (VITAMIN C) 100 MG tablet Take 500 mg by mouth daily.   . calcium carbonate (OS-CAL) 1250 (500 Ca) MG chewable tablet Chew 1 tablet by mouth daily.   . citalopram (CELEXA) 10 MG tablet TAKE 1 TABLET BY MOUTH EVERY DAY  . diltiazem (CARDIZEM CD) 120 MG 24 hr capsule TAKE 1 CAPSULE BY MOUTH EVERY DAY  . ELIQUIS 5 MG TABS tablet TAKE 1 TABLET BY MOUTH TWICE A DAY  . guaiFENesin (MUCINEX) 600 MG 12 hr tablet Take 600 mg by mouth daily.   Marland Kitchen loratadine (CLARITIN) 10 MG tablet Take 10 mg by mouth every other day.   Marland Kitchen LORazepam (ATIVAN) 0.5 MG tablet TAKE 1-2 TABLETS BY MOUTH 2 TIMES DAILY AS NEEDED FOR ANXIETY. AVOID REGULAR USE  . magnesium hydroxide (MILK OF MAGNESIA) 400 MG/5ML suspension Take 15 mLs by mouth daily as needed for mild constipation.  . Multiple Vitamin (MULTIVITAMIN) tablet Take 1 tablet by mouth daily.  . nitrofurantoin, macrocrystal-monohydrate, (MACROBID) 100 MG capsule Take 100 mg by mouth daily.  Marland Kitchen PREMARIN vaginal cream INSERT 1 GRAM VAGINALLY THREE TIMES A WEEKLY, THEN ONCE WEEKLY  . Probiotic Product (ALIGN) 4 MG CAPS Take 1 capsule by mouth daily.  . RESTASIS 0.05 % ophthalmic emulsion Place 1 drop into both eyes 2 (two) times daily.  Marland Kitchen triamcinolone (NASACORT) 55 MCG/ACT AERO nasal inhaler SMARTSIG:2 Spray(s) Both Nares Every Night   No facility-administered encounter medications on file as of 01/10/2020.    Review of Systems:  Review of Systems  Constitutional: Negative for chills and fever.  Respiratory: Negative for shortness of breath.     Cardiovascular: Negative for chest pain, palpitations and leg swelling.  Gastrointestinal: Positive for constipation.       Improved lately  Genitourinary: Negative for dysuria.  Musculoskeletal: Negative for falls and joint pain.  Neurological: Negative for dizziness and loss of consciousness.  Psychiatric/Behavioral: Positive for memory loss. The patient is nervous/anxious.     Health Maintenance  Topic Date Due  . INFLUENZA VACCINE  10/31/2019  . MAMMOGRAM  06/29/2021  . TETANUS/TDAP  08/29/2021  . DEXA SCAN  Completed  . COVID-19 Vaccine  Completed  . PNA vac Low Risk Adult  Completed    Physical Exam: Vitals:   01/10/20 1557  BP: 122/82  Pulse: 78  Temp: 97.7 F (36.5 C)  TempSrc:  Temporal  SpO2: 97%  Weight: 155 lb 12.8 oz (70.7 kg)  Height: 5' (1.524 m)   Body mass index is 30.43 kg/m. Physical Exam Vitals reviewed.  Constitutional:      Appearance: Normal appearance. She is obese.  Cardiovascular:     Rate and Rhythm: Normal rate and regular rhythm.  Pulmonary:     Effort: Pulmonary effort is normal.     Breath sounds: Normal breath sounds.  Musculoskeletal:        General: Normal range of motion.  Skin:    General: Skin is warm and dry.  Neurological:     General: No focal deficit present.     Mental Status: She is alert and oriented to person, place, and time.    Labs reviewed: Basic Metabolic Panel: Recent Labs    04/07/19 1552 06/17/19 1422 06/17/19 1947 06/18/19 0326 06/19/19 0353 08/02/19 2035  NA 129*   < >  --  139 137 131*  K 4.1   < >  --  3.2* 3.8 4.2  CL 96   < >  --  105 105 98  CO2 25   < >  --  23 24 23   GLUCOSE 90   < >  --  91 83 96  BUN 8   < >  --  6* 7* 7*  CREATININE 0.87   < >  --  0.86 0.86 0.81  CALCIUM 9.2   < >  --  8.6* 8.6* 9.4  MG  --   --  1.6*  --   --   --   TSH 2.39  --   --   --   --   --    < > = values in this interval not displayed.   Liver Function Tests: Recent Labs    06/17/19 1422  06/18/19 0326 08/02/19 2035  AST 25 17 28   ALT 22 16 22   ALKPHOS 60 48 61  BILITOT 0.8 0.8 0.5  PROT 6.6 5.4* 6.8  ALBUMIN 3.8 3.0* 4.0   Recent Labs    06/17/19 1422 08/02/19 2035  LIPASE 45 30   No results for input(s): AMMONIA in the last 8760 hours. CBC: Recent Labs    01/18/19 1555 01/18/19 1555 04/07/19 1552 04/07/19 1552 06/17/19 1422 06/18/19 0326 06/19/19 0353 08/02/19 2035 09/29/19 1454  WBC 8.1   < > 9.5   < > 11.9*   < > 5.8 7.0 9.1  NEUTROABS 4.8  --  7.0  --  9.3*  --   --   --   --   HGB 13.5   < > 13.3   < > 13.7   < > 12.1 13.5 13.6  HCT 40.3   < > 39.3   < > 42.0   < > 36.7 39.3 41.3  MCV 95.3   < > 95.9   < > 98.4   < > 96.8 94.9 97.2  PLT 247.0   < > 241.0   < > 249   < > 217 286 298   < > = values in this interval not displayed.   Lipid Panel: Recent Labs    04/07/19 1552  CHOL 202*  HDL 67.30  TRIG 203.0*  CHOLHDL 3  LDLDIRECT 100.0   No results found for: HGBA1C   Assessment/Plan 1. Anxiety -hold off on lorazepam for now and try lexapro qhs instead -if after a few days, you experience side effects, let me know and stop it -  escitalopram (LEXAPRO) 5 MG tablet; Take 1 tablet (5 mg total) by mouth at bedtime.  Dispense: 30 tablet; Refill: 1 -if not successful, consider trazodone but she would go back to lorazepam temporarily in b/w  2. Constipation, outlet dysfunction -cont current regimen and pelvic floor exercises  3. Age-related osteoporosis without current pathological fracture -cont ca with D, back walking--using treadmill which was encouraged today  4. Need for influenza vaccination - Flu Vaccine QUAD High Dose(Fluad)  Labs/tests ordered:  No new Next appt:  6 wks f/u anxiety  Naoma Boxell L. Vickey Boak, D.O. [facility] [address] [place], [place] [address] Cell Phone (Mon-Fri 8am-5pm):  [phone] On Call:  [phone] & follow prompts after 5pm & weekends Office Phone:   [phone] Office Fax:  [phone]

## 2020-01-10 NOTE — Patient Instructions (Signed)
[place] will provide third COVID-19 Vaccine: Booster shots will be at all [place] vaccination clinics at [email or web address] You may visit that website or call [phone] Monday thru Friday from 7am to 7pm.

## 2020-01-31 ENCOUNTER — Other Ambulatory Visit: Payer: Self-pay | Admitting: Cardiology

## 2020-02-03 ENCOUNTER — Other Ambulatory Visit: Payer: Self-pay | Admitting: Internal Medicine

## 2020-02-03 DIAGNOSIS — F419 Anxiety disorder, unspecified: Secondary | ICD-10-CM

## 2020-02-18 ENCOUNTER — Telehealth: Payer: Self-pay | Admitting: Internal Medicine

## 2020-02-18 NOTE — Telephone Encounter (Signed)
Patient reports intermittent rectal bleeding for a a week.  She may go a day or two without seeing the bleeding.  She is on Eliquis. She will come in and see Alonza Bogus, PA on 02/22/20 1:30

## 2020-02-20 ENCOUNTER — Other Ambulatory Visit (INDEPENDENT_AMBULATORY_CARE_PROVIDER_SITE_OTHER): Payer: Self-pay | Admitting: Otolaryngology

## 2020-02-21 ENCOUNTER — Ambulatory Visit: Payer: Medicare Other | Admitting: Internal Medicine

## 2020-02-22 ENCOUNTER — Telehealth: Payer: Self-pay | Admitting: Cardiovascular Disease

## 2020-02-22 ENCOUNTER — Encounter: Payer: Self-pay | Admitting: Gastroenterology

## 2020-02-22 ENCOUNTER — Ambulatory Visit (INDEPENDENT_AMBULATORY_CARE_PROVIDER_SITE_OTHER): Payer: Medicare Other | Admitting: Gastroenterology

## 2020-02-22 VITALS — BP 114/68 | HR 70 | Ht 60.0 in | Wt 160.0 lb

## 2020-02-22 DIAGNOSIS — K625 Hemorrhage of anus and rectum: Secondary | ICD-10-CM

## 2020-02-22 DIAGNOSIS — K644 Residual hemorrhoidal skin tags: Secondary | ICD-10-CM | POA: Diagnosis not present

## 2020-02-22 MED ORDER — HYDROCORTISONE (PERIANAL) 2.5 % EX CREA: 1.0000 "application " | TOPICAL_CREAM | Freq: Three times a day (TID) | CUTANEOUS | 1 refills | Status: DC

## 2020-02-22 MED ORDER — HYDROCORTISONE ACETATE 25 MG RE SUPP: 25.0000 mg | Freq: Every day | RECTAL | 0 refills | Status: DC

## 2020-02-22 NOTE — Patient Instructions (Addendum)
If you are age 84 or older, your body mass index should be between 23-30. Your Body mass index is 31.25 kg/m. If this is out of the aforementioned range listed, please consider follow up with your Primary Care Provider.  If you are age 26 or younger, your body mass index should be between 19-25. Your Body mass index is 31.25 kg/m. If this is out of the aformentioned range listed, please consider follow up with your Primary Care Provider.   We have sent the following medications to your pharmacy for you to pick up at your convenience: Hydrocortisone cream three times daily. Anusal suppository once daily at bed time.  Call back in 2 weeks with an update on how you are doing. Ask for Koren Shiver

## 2020-02-22 NOTE — Telephone Encounter (Signed)
Spoke with patient. She has inflamed hemorrhoids and saw GI today.  They ordered suppositories and cream to use.  She has bleeding with almost each BM and then there is a small amount of bleeding afterward.    She asked about holding Eliquis.  I adv that it is not recommended to hold Eliquis for minor bleeding because risk of stroke is increased then.  She does not want to risk it.  She will try to treatments from GI.  I adv in the future if bleeding worsens she may have to stop temporarily but for small amount this is usually not recommend.    Pt appreciative for information provided and would still like to hear what Dr. Angelena Form recommends.  Aware I will call her back with his input.

## 2020-02-22 NOTE — Telephone Encounter (Signed)
Patient's daughter, Santiago Glad, calling to ask if it is okay for patient to stop her ELIQUIS 5 MG TABS tablet for a few days. Patient was seen today by gastroenterology and was told that she has had rectal bleeding for a week due to hemorroids and the gastroenterologist suggested that they call Dr. Camillia Herter office to make this request. Please calll/advice.   Thank you!

## 2020-02-22 NOTE — Progress Notes (Signed)
03/03/2020 Ashley Lawson [id number] September 01, 1932   HISTORY OF PRESENT ILLNESS: This is an 86 year old female who is a patient of Dr. Celesta Aver.  She has been seen here on multiple occasions with complaints of left lower quadrant abdominal pain which is chronic and thought to be due to symptomatic diverticulosis/luminal narrowing and spasm in the area.  She presents here today with her daughter for complaints of rectal bleeding.  They say that she has had rectal bleeding on and off for a long time.  Described as bright red blood on the tissue and sometimes in the toilet bowl.  She is on Eliquis for atrial fibrillation.  They say that in the past if she has had bleeding they have stopped the Eliquis for couple of days and the bleeding resolves.  She had colonoscopy last in 2016 due to family history of colon cancer and was found to have 3 sessile polyps all around 6 mm, moderate diverticulosis in the descending and sigmoid colon with associated hypertrophy and tortuosity.  The polyps were hyperplastic polyps.   CT scan of the abdomen and pelvis with contrast in May showed the following:  IMPRESSION: 1. No explanation for patient's persistent left lower quadrant abdominal pain. Specifically, no evidence of diverticulitis, enteric or urinary obstruction. Normal appearance of the appendix. 2. Previously questioned rectal wall thickening has resolved in the interval. 3.  Aortic Atherosclerosis (ICD10-I70.0).   Past Medical History:  Diagnosis Date  . Abdominal pain 12/2009   hospitalized  . Allergic rhinitis   . Anxiety   . Bladder problem   . Chronic constipation   . Compression fx, lumbar spine ([place])   . DDD (degenerative disc disease), lumbar   . Diverticulosis of colon   . Dog bite(E906.0) 08/30/2011  . Dysrhythmia   . GERD (gastroesophageal reflux disease)   . Hepatic steatosis    pt denies  . Hyperlipidemia   . Hyperplastic colon polyp   . IBS (irritable bowel syndrome)      constipation predominant  . Internal hemorrhoids   . Osteoporosis    dexa 2011 -2.7 hip nl spine  . PAF (paroxysmal atrial fibrillation) ([place])    a. dx 05/2017.  Marland Kitchen PMR (polymyalgia rheumatica) ([facility]) 08/30/2011   under rheum care  on low dose pred 5 mg   . Renal disorder    Past Surgical History:  Procedure Laterality Date  . ABDOMINAL HYSTERECTOMY     with bladder tact and rectocele repair  . BLADDER SURGERY     Streched  . CATARACT EXTRACTION  2010   both  . COLONOSCOPY  2016 was most recent  . CYSTO WITH HYDRODISTENSION N/A 10/05/2019   Procedure: CYSTOSCOPY/HYDRODISTENSION;  Surgeon: Bjorn Loser, MD;  Location: [facility];  Service: Urology;  Laterality: N/A;  . TONSILLECTOMY AND ADENOIDECTOMY      reports that she has never smoked. She has never used smokeless tobacco. She reports current alcohol use. She reports that she does not use drugs. family history includes Alzheimer's disease (age of onset: 76) in her sister; Bladder Cancer (age of onset: 34) in her sister; Crohn's disease in her son; Heart attack (age of onset: 29) in her father; Liver disease (age of onset: 82) in her brother; Stroke (age of onset: 61) in her mother. Allergies  Allergen Reactions  . Alendronate Sodium     upset stomach  . Penicillins     Has patient had a PCN reaction causing immediate rash, facial/tongue/throat swelling, SOB or lightheadedness with  hypotension: Yes Has patient had a PCN reaction causing severe rash involving mucus membranes or skin necrosis: No Has patient had a PCN reaction that required hospitalization: No Has patient had a PCN reaction occurring within the last 10 years: No If all of the above answers are "NO", then may proceed with Cephalosporin use.   Marland Kitchen Amoxicillin Rash    Has patient had a PCN reaction causing immediate rash, facial/tongue/throat swelling, SOB or lightheadedness with hypotension: yes Has patient had a PCN reaction causing severe rash involving mucus  membranes or skin necrosis: no Has patient had a PCN reaction that required hospitalization: no Has patient had PCN reaction within the last 10 years: no  If all of the above answers are "NO", then may proceed with Cephalosporin use.  REACTION: unspecified  . Metronidazole Other (See Comments)    Pounding headaches patient says was bad.  With cipro for diverticulitis rx. Other reaction(s): Other (See Comments) Pounding headaches patient says was bad.With cipro for diverticulitis rx.      Outpatient Encounter Medications as of 02/22/2020  Medication Sig  . acetaminophen (TYLENOL) 500 MG tablet Take 500 mg by mouth. Three a week  . Ascorbic Acid (VITAMIN C) 100 MG tablet Take 500 mg by mouth daily.   . calcium carbonate (OS-CAL) 1250 (500 Ca) MG chewable tablet Chew 1 tablet by mouth daily.   Marland Kitchen diltiazem (CARDIZEM CD) 120 MG 24 hr capsule TAKE 1 CAPSULE BY MOUTH EVERY DAY  . ELIQUIS 5 MG TABS tablet TAKE 1 TABLET BY MOUTH TWICE A DAY  . escitalopram (LEXAPRO) 5 MG tablet TAKE 1 TABLET BY MOUTH EVERYDAY AT BEDTIME  . guaiFENesin (MUCINEX) 600 MG 12 hr tablet Take 600 mg by mouth daily.   Marland Kitchen loratadine (CLARITIN) 10 MG tablet Take 10 mg by mouth every other day.   . magnesium hydroxide (MILK OF MAGNESIA) 400 MG/5ML suspension Take 15 mLs by mouth daily as needed for mild constipation.  . Multiple Vitamin (MULTIVITAMIN) tablet Take 1 tablet by mouth daily.  Marland Kitchen PREMARIN vaginal cream INSERT 1 GRAM VAGINALLY THREE TIMES A WEEKLY, THEN ONCE WEEKLY  . Probiotic Product (ALIGN) 4 MG CAPS Take 1 capsule by mouth daily.  . RESTASIS 0.05 % ophthalmic emulsion Place 1 drop into both eyes 2 (two) times daily.  Marland Kitchen triamcinolone (NASACORT) 55 MCG/ACT AERO nasal inhaler 2 SPRAYS INTO EACH NOSTRIL AT NIGHT  . hydrocortisone (ANUSOL-HC) 2.5 % rectal cream Place 1 application rectally 3 (three) times daily.  . hydrocortisone (ANUSOL-HC) 25 MG suppository Place 1 suppository (25 mg total) rectally at bedtime.   . [DISCONTINUED] LORazepam (ATIVAN) 0.5 MG tablet Take 1 tablet (0.5 mg total) by mouth at bedtime as needed for anxiety.  . [DISCONTINUED] nitrofurantoin, macrocrystal-monohydrate, (MACROBID) 100 MG capsule Take 100 mg by mouth daily.   No facility-administered encounter medications on file as of 02/22/2020.     REVIEW OF SYSTEMS  : All other systems reviewed and negative except where noted in the History of Present Illness.   PHYSICAL EXAM: BP 114/68   Pulse 70   Ht 5' (1.524 m)   Wt 160 lb (72.6 kg)   BMI 31.25 kg/m  General: Well developed white female in no acute distress Head: Normocephalic and atraumatic Eyes:  Sclerae anicteric, conjunctiva pink. Ears: Normal auditory acuity Lungs: Clear throughout to auscultation; no W/R/R. Heart: Regular rate and rhythm; no M/R/G. Abdomen: Soft, non-distended.  BS present.  Mild LLQ abdominal TTP (chronic).   Rectal: Very inflamed external hemorrhoid.  DRE did not reveal any masses.  Brown stool noted on the exam glove. Musculoskeletal: Symmetrical with no gross deformities  Skin: No lesions on visible extremities Extremities: No edema  Neurological: Alert oriented x 4, grossly non-focal Psychological:  Alert and cooperative. Normal mood and affect  ASSESSMENT AND PLAN: *Rectal bleeding secondary to inflamed external hemorrhoids: We will treat with hydrocortisone 2-3 times daily for the next 7 to 10 days.  She will call us back in 2 weeks with an update on her symptoms.  She can then use that intermittently as needed.  Prescription sent to pharmacy. *PAF on Eliquis: They say that she has stopped her Eliquis for couple of days in the past if she has had any type of rectal bleeding and then it resolved.  They were advised to check with cardiology before proceeding with doing anything like that again.   CC:  Reed, Tiffany L, DO

## 2020-02-23 NOTE — Telephone Encounter (Signed)
Called back and spoke with Santiago Glad Apple ([facility]).  Informed of Dr. Camillia Herter input.   She is in agreement with this plan.

## 2020-02-23 NOTE — Telephone Encounter (Signed)
Agree that if there is just a small amount of bleeding she should continue Eliquis uninterrupted but if bleeding worsens, she will have to hold it. Gerald Stabs

## 2020-03-01 ENCOUNTER — Ambulatory Visit (INDEPENDENT_AMBULATORY_CARE_PROVIDER_SITE_OTHER): Payer: Medicare Other | Admitting: Family

## 2020-03-01 ENCOUNTER — Encounter: Payer: Self-pay | Admitting: Family

## 2020-03-01 ENCOUNTER — Other Ambulatory Visit: Payer: Self-pay

## 2020-03-01 VITALS — BP 130/65 | HR 75 | Temp 97.7°F | Resp 20 | Ht 60.0 in | Wt 161.0 lb

## 2020-03-01 DIAGNOSIS — R059 Cough, unspecified: Secondary | ICD-10-CM

## 2020-03-01 DIAGNOSIS — J0101 Acute recurrent maxillary sinusitis: Secondary | ICD-10-CM | POA: Diagnosis not present

## 2020-03-01 MED ORDER — AZITHROMYCIN 250 MG PO TABS: ORAL_TABLET | ORAL | 0 refills | Status: DC

## 2020-03-01 NOTE — Patient Instructions (Signed)
- continue on Robitussin every 6 hrs as needed - Notify provider or go to ED if symptoms worsen.   Sinusitis, Adult Sinusitis is soreness and swelling (inflammation) of your sinuses. Sinuses are hollow spaces in the bones around your face. They are located:  Around your eyes.  In the middle of your forehead.  Behind your nose.  In your cheekbones. Your sinuses and nasal passages are lined with a fluid called mucus. Mucus drains out of your sinuses. Swelling can trap mucus in your sinuses. This lets germs (bacteria, virus, or fungus) grow, which leads to infection. Most of the time, this condition is caused by a virus. What are the causes? This condition is caused by:  Allergies.  Asthma.  Germs.  Things that block your nose or sinuses.  Growths in the nose (nasal polyps).  Chemicals or irritants in the air.  Fungus (rare). What increases the risk? You are more likely to develop this condition if:  You have a weak body defense system (immune system).  You do a lot of swimming or diving.  You use nasal sprays too much.  You smoke. What are the signs or symptoms? The main symptoms of this condition are pain and a feeling of pressure around the sinuses. Other symptoms include:  Stuffy nose (congestion).  Runny nose (drainage).  Swelling and warmth in the sinuses.  Headache.  Toothache.  A cough that may get worse at night.  Mucus that collects in the throat or the back of the nose (postnasal drip).  Being unable to smell and taste.  Being very tired (fatigue).  A fever.  Sore throat.  Bad breath. How is this diagnosed? This condition is diagnosed based on:  Your symptoms.  Your medical history.  A physical exam.  Tests to find out if your condition is short-term (acute) or long-term (chronic). Your doctor may: ? Check your nose for growths (polyps). ? Check your sinuses using a tool that has a light (endoscope). ? Check for allergies or  germs. ? Do imaging tests, such as an MRI or CT scan. How is this treated? Treatment for this condition depends on the cause and whether it is short-term or long-term.  If caused by a virus, your symptoms should go away on their own within 10 days. You may be given medicines to relieve symptoms. They include: ? Medicines that shrink swollen tissue in the nose. ? Medicines that treat allergies (antihistamines). ? A spray that treats swelling of the nostrils. ? Rinses that help get rid of thick mucus in your nose (nasal saline washes).  If caused by bacteria, your doctor may wait to see if you will get better without treatment. You may be given antibiotic medicine if you have: ? A very bad infection. ? A weak body defense system.  If caused by growths in the nose, you may need to have surgery. Follow these instructions at home: Medicines  Take, use, or apply over-the-counter and prescription medicines only as told by your doctor. These may include nasal sprays.  If you were prescribed an antibiotic medicine, take it as told by your doctor. Do not stop taking the antibiotic even if you start to feel better. Hydrate and humidify   Drink enough water to keep your pee (urine) pale yellow.  Use a cool mist humidifier to keep the humidity level in your home above 50%.  Breathe in steam for 10-15 minutes, 3-4 times a day, or as told by your doctor. You can do  this in the bathroom while a hot shower is running.  Try not to spend time in cool or dry air. Rest  Rest as much as you can.  Sleep with your head raised (elevated).  Make sure you get enough sleep each night. General instructions   Put a warm, moist washcloth on your face 3-4 times a day, or as often as told by your doctor. This will help with discomfort.  Wash your hands often with soap and water. If there is no soap and water, use hand sanitizer.  Do not smoke. Avoid being around people who are smoking (secondhand  smoke).  Keep all follow-up visits as told by your doctor. This is important. Contact a doctor if:  You have a fever.  Your symptoms get worse.  Your symptoms do not get better within 10 days. Get help right away if:  You have a very bad headache.  You cannot stop throwing up (vomiting).  You have very bad pain or swelling around your face or eyes.  You have trouble seeing.  You feel confused.  Your neck is stiff.  You have trouble breathing. Summary  Sinusitis is swelling of your sinuses. Sinuses are hollow spaces in the bones around your face.  This condition is caused by tissues in your nose that become inflamed or swollen. This traps germs. These can lead to infection.  If you were prescribed an antibiotic medicine, take it as told by your doctor. Do not stop taking it even if you start to feel better.  Keep all follow-up visits as told by your doctor. This is important. This information is not intended to replace advice given to you by your health care provider. Make sure you discuss any questions you have with your health care provider. Document Revised: 08/18/2017 Document Reviewed: 08/18/2017 Elsevier Patient Education  [place].

## 2020-03-01 NOTE — Progress Notes (Signed)
Provider: Eureka Valdes FNP-C  Gayland Curry, DO  Patient Care Team: Gayland Curry, DO as PCP - General (Geriatric Medicine) Burnell Blanks, MD as PCP - Cardiology (Cardiology) Bjorn Loser, MD (Urology) Particia Nearing, MD (Dermatology) Luberta Mutter, MD (Ophthalmology) Almedia Balls, MD (Orthopedic Surgery) Ouida Sills  (Rheumatology) Jarome Matin, MD as Consulting Physician (Dermatology) Gatha Mayer, MD as Consulting Physician (Gastroenterology) Rozetta Nunnery, MD as Consulting Physician (Otolaryngology)  Extended Emergency Contact Information Primary Emergency Contact: Apple,Karen Address: [address]          [place] [address] [name] of [place] Phone: [phone] Work Phone: [phone] Mobile Phone: [phone] Relation: Daughter  Code Status: Full Code  Goals of care: Advanced Directive information Advanced Directives 03/01/2020  Does Patient Have a Medical Advance Directive? Yes  Type of Paramedic of [place];Out of facility DNR (pink MOST or yellow form)  Does patient want to make changes to medical advance directive? No - Patient declined  Would patient like information on creating a medical advance directive? -     Chief Complaint  Patient presents with  . Acute Visit    Sinus Infection and Clogged Ears Slight Cough    HPI:  Pt is a 86 y.o. female seen today for an acute visit for evaluation of sinus Infection and clogged ears and slight cough which has worsen since yesterday. Had a headache last night.drainage from left nare but none from the right.feels clogged and tender over right face.she has used Nanny pot to flush but left nare opened up but not right side.cough is non-productive cough.feels like drainage running down the throat irritating it.Has pressure in the ears but pain.No fever,chills,loss of taste or smell.Also denies any sore throat or shortness of breath.Has not been in  contact with sick person with COVID-19.she has had a COVID-19 vaccine.Has a significant medical history of sinus issues uses NasaCort and takes Loratadine daily.   Past Medical History:  Diagnosis Date  . Abdominal pain 12/2009   hospitalized  . Allergic rhinitis   . Anxiety   . Bladder problem   . Chronic constipation   . Compression fx, lumbar spine ([place])   . DDD (degenerative disc disease), lumbar   . Diverticulosis of colon   . Dog bite(E906.0) 08/30/2011  . Dysrhythmia   . GERD (gastroesophageal reflux disease)   . Hepatic steatosis    pt denies  . Hyperlipidemia   . Hyperplastic colon polyp   . IBS (irritable bowel syndrome)    constipation predominant  . Internal hemorrhoids   . Osteoporosis    dexa 2011 -2.7 hip nl spine  . PAF (paroxysmal atrial fibrillation) ([facility])    a. dx 05/2017.  Marland Kitchen PMR (polymyalgia rheumatica) ([facility]) 08/30/2011   under rheum care  on low dose pred 5 mg   . Renal disorder    Past Surgical History:  Procedure Laterality Date  . ABDOMINAL HYSTERECTOMY     with bladder tact and rectocele repair  . BLADDER SURGERY     Streched  . CATARACT EXTRACTION  2010   both  . COLONOSCOPY  2016 was most recent  . CYSTO WITH HYDRODISTENSION N/A 10/05/2019   Procedure: CYSTOSCOPY/HYDRODISTENSION;  Surgeon: Bjorn Loser, MD;  Location: [facility];  Service: Urology;  Laterality: N/A;  . TONSILLECTOMY AND ADENOIDECTOMY      Allergies  Allergen Reactions  . Alendronate Sodium     upset stomach  . Penicillins     Has patient had a PCN  reaction causing immediate rash, facial/tongue/throat swelling, SOB or lightheadedness with hypotension: Yes Has patient had a PCN reaction causing severe rash involving mucus membranes or skin necrosis: No Has patient had a PCN reaction that required hospitalization: No Has patient had a PCN reaction occurring within the last 10 years: No If all of the above answers are "NO", then may proceed with Cephalosporin use.   Marland Kitchen  Amoxicillin Rash    Has patient had a PCN reaction causing immediate rash, facial/tongue/throat swelling, SOB or lightheadedness with hypotension: yes Has patient had a PCN reaction causing severe rash involving mucus membranes or skin necrosis: no Has patient had a PCN reaction that required hospitalization: no Has patient had PCN reaction within the last 10 years: no  If all of the above answers are "NO", then may proceed with Cephalosporin use.  REACTION: unspecified  . Metronidazole Other (See Comments)    Pounding headaches patient says was bad.  With cipro for diverticulitis rx. Other reaction(s): Other (See Comments) Pounding headaches patient says was bad.With cipro for diverticulitis rx.    Outpatient Encounter Medications as of 03/01/2020  Medication Sig  . acetaminophen (TYLENOL) 500 MG tablet Take 500 mg by mouth. Three a week  . Ascorbic Acid (VITAMIN C) 100 MG tablet Take 500 mg by mouth daily.   . calcium carbonate (OS-CAL) 1250 (500 Ca) MG chewable tablet Chew 1 tablet by mouth daily.   Marland Kitchen diltiazem (CARDIZEM CD) 120 MG 24 hr capsule TAKE 1 CAPSULE BY MOUTH EVERY DAY  . ELIQUIS 5 MG TABS tablet TAKE 1 TABLET BY MOUTH TWICE A DAY  . escitalopram (LEXAPRO) 5 MG tablet TAKE 1 TABLET BY MOUTH EVERYDAY AT BEDTIME  . guaiFENesin (MUCINEX) 600 MG 12 hr tablet Take 600 mg by mouth daily.   . hydrocortisone (ANUSOL-HC) 2.5 % rectal cream Place 1 application rectally 3 (three) times daily.  . hydrocortisone (ANUSOL-HC) 25 MG suppository Place 1 suppository (25 mg total) rectally at bedtime.  Marland Kitchen loratadine (CLARITIN) 10 MG tablet Take 10 mg by mouth every other day.   . magnesium hydroxide (MILK OF MAGNESIA) 400 MG/5ML suspension Take 15 mLs by mouth daily as needed for mild constipation.  . Multiple Vitamin (MULTIVITAMIN) tablet Take 1 tablet by mouth daily.  Marland Kitchen PREMARIN vaginal cream INSERT 1 GRAM VAGINALLY THREE TIMES A WEEKLY, THEN ONCE WEEKLY  . Probiotic Product (ALIGN) 4 MG  CAPS Take 1 capsule by mouth daily.  . RESTASIS 0.05 % ophthalmic emulsion Place 1 drop into both eyes 2 (two) times daily.  Marland Kitchen triamcinolone (NASACORT) 55 MCG/ACT AERO nasal inhaler 2 SPRAYS INTO EACH NOSTRIL AT NIGHT   No facility-administered encounter medications on file as of 03/01/2020.    Review of Systems  Constitutional: Negative for appetite change, chills, fatigue and fever.  HENT: Positive for congestion, hearing loss, postnasal drip, sinus pressure and sinus pain. Negative for ear discharge, ear pain, rhinorrhea, sneezing, sore throat, tinnitus and trouble swallowing.   Eyes: Negative for pain, discharge, redness and itching.  Respiratory: Positive for cough. Negative for choking, chest tightness, shortness of breath and wheezing.   Cardiovascular: Negative for chest pain, palpitations and leg swelling.  Gastrointestinal: Negative for abdominal distention, abdominal pain, diarrhea, nausea and vomiting.  Skin: Negative for color change, pallor and rash.  Neurological: Positive for headaches. Negative for dizziness, speech difficulty, weakness, light-headedness and numbness.    Immunization History  Administered Date(s) Administered  . Fluad Quad(high Dose 65+) 01/10/2020  . Influenza Split 01/25/2011, 01/23/2012  .  Influenza Whole 12/31/2007  . Influenza, High Dose Seasonal PF 01/19/2013, 01/10/2014, 01/17/2015, 01/18/2016, 01/02/2017, 01/05/2018, 01/05/2019, 01/05/2019  . Influenza-Unspecified 01/02/2017  . PFIZER SARS-COV-2 Vaccination 05/07/2019, 06/02/2019, 01/31/2020  . Pneumococcal Conjugate-13 02/23/2013  . Pneumococcal Polysaccharide-23 04/01/2006, 04/01/2018  . Td 04/01/2005  . Tdap 08/30/2011  . Zoster 04/02/2007  . Zoster Recombinat (Shingrix) 12/04/2016, 07/27/2017   Pertinent  Health Maintenance Due  Topic Date Due  . MAMMOGRAM  06/29/2021  . INFLUENZA VACCINE  Completed  . DEXA SCAN  Completed  . PNA vac Low Risk Adult  Completed   Fall Risk  01/10/2020  11/29/2019 04/07/2019 04/08/2018 04/03/2017  Falls in the past year? 0 1 0 0 No  Number falls in past yr: 0 0 0 0 -  Injury with Fall? 0 1 0 0 -  Comment - Bruise on the side - - -   Functional Status Survey:    Vitals:   03/01/20 1258  BP: 130/65  Pulse: 75  Resp: 20  Temp: 97.7 F (36.5 C)  SpO2: 96%  Weight: 161 lb (73 kg)  Height: 5' (1.524 m)   Body mass index is 31.44 kg/m. Physical Exam Constitutional:      General: She is not in acute distress.    Appearance: She is obese. She is not ill-appearing.  HENT:     Head: Normocephalic.     Right Ear: Tympanic membrane, ear canal and external ear normal. There is no impacted cerumen.     Left Ear: Tympanic membrane, ear canal and external ear normal. There is no impacted cerumen.     Nose: Congestion present. No rhinorrhea.     Right Turbinates: Not enlarged, swollen or pale.     Left Turbinates: Not enlarged, swollen or pale.     Right Sinus: Maxillary sinus tenderness present. No frontal sinus tenderness.     Left Sinus: No maxillary sinus tenderness or frontal sinus tenderness.     Mouth/Throat:     Mouth: Mucous membranes are dry.     Pharynx: Oropharynx is clear. No oropharyngeal exudate or posterior oropharyngeal erythema.  Eyes:     General: No scleral icterus.       Right eye: No discharge.        Left eye: No discharge.     Extraocular Movements: Extraocular movements intact.     Conjunctiva/sclera: Conjunctivae normal.     Pupils: Pupils are equal, round, and reactive to light.  Cardiovascular:     Rate and Rhythm: Normal rate and regular rhythm.     Pulses: Normal pulses.     Heart sounds: Normal heart sounds. No murmur heard.  No friction rub. No gallop.   Pulmonary:     Effort: Pulmonary effort is normal. No respiratory distress.     Breath sounds: Normal breath sounds. No wheezing, rhonchi or rales.  Chest:     Chest wall: No tenderness.  Abdominal:     General: Bowel sounds are normal. There is no  distension.     Palpations: Abdomen is soft. There is no mass.     Tenderness: There is no abdominal tenderness. There is no right CVA tenderness, left CVA tenderness, guarding or rebound.  Musculoskeletal:     Cervical back: Normal range of motion. No rigidity or tenderness.     Right lower leg: No edema.     Left lower leg: No edema.  Lymphadenopathy:     Cervical: No cervical adenopathy.  Skin:    General: Skin is warm  and dry.     Coloration: Skin is not pale.     Findings: No bruising, erythema or rash.  Neurological:     Mental Status: She is alert and oriented to person, place, and time.     Motor: No weakness.     Gait: Gait normal.  Psychiatric:        Mood and Affect: Mood normal.        Behavior: Behavior normal.        Thought Content: Thought content normal.     Labs reviewed: Recent Labs    06/17/19 1422 06/17/19 1947 06/18/19 0326 06/19/19 0353 08/02/19 2035  NA   < >  --  139 137 131*  K   < >  --  3.2* 3.8 4.2  CL   < >  --  105 105 98  CO2   < >  --  23 24 23   GLUCOSE   < >  --  91 83 96  BUN   < >  --  6* 7* 7*  CREATININE   < >  --  0.86 0.86 0.81  CALCIUM   < >  --  8.6* 8.6* 9.4  MG  --  1.6*  --   --   --    < > = values in this interval not displayed.   Recent Labs    06/17/19 1422 06/18/19 0326 08/02/19 2035  AST 25 17 28   ALT 22 16 22   ALKPHOS 60 48 61  BILITOT 0.8 0.8 0.5  PROT 6.6 5.4* 6.8  ALBUMIN 3.8 3.0* 4.0   Recent Labs    04/07/19 1552 04/07/19 1552 06/17/19 1422 06/18/19 0326 06/19/19 0353 08/02/19 2035 09/29/19 1454  WBC 9.5   < > 11.9*   < > 5.8 7.0 9.1  NEUTROABS 7.0  --  9.3*  --   --   --   --   HGB 13.3   < > 13.7   < > 12.1 13.5 13.6  HCT 39.3   < > 42.0   < > 36.7 39.3 41.3  MCV 95.9   < > 98.4   < > 96.8 94.9 97.2  PLT 241.0   < > 249   < > 217 286 298   < > = values in this interval not displayed.   Lab Results  Component Value Date   TSH 2.39 04/07/2019   No results found for: HGBA1C Lab Results   Component Value Date   CHOL 202 (H) 04/07/2019   HDL 67.30 04/07/2019   LDLCALC 105 (H) 04/08/2018   LDLDIRECT 100.0 04/07/2019   TRIG 203.0 (H) 04/07/2019   CHOLHDL 3 04/07/2019    Significant Diagnostic Results in last 30 days:  No results found.  Assessment/Plan  1. Acute recurrent maxillary sinusitis Afebrile. Right nasal congestion with maxillary tenderness. - continue with neti pot nasal irrigation  - continue on loratadine and Nasacort  - start on Z-pak as below.Side effects discussed states has taken Z-pak in the past and seems to work well for her.continue on Align probiotics.  - Apply warm wet wash cloth over right face for symptoms relief.  - azithromycin (ZITHROMAX) 250 MG tablet; Take 2 tablets ( 500 mg )  by mouth x 1 dose today then take one ( 250 mg ) by mouth daily x 4 days and stop.  Dispense: 6 tablet; Refill: 0  2. Cough Non-Productive suspected irritation from nasal drainage making her to cough.bilateral lungs clear  to Auscultation.  - continue on OTC Robutussin 10 cc by mouth every 6 hrs as needed for cough  - Advised to notify provider or go to ED if symptoms worsen.   Family/ staff Communication: Reviewed plan of care with patient  Labs/tests ordered: None   Next Appointment: Has upcoming appointment with Dr.Reed 03/06/2020 for 6 months follow up.   Sandrea Hughs, NP

## 2020-03-03 ENCOUNTER — Encounter: Payer: Self-pay | Admitting: Gastroenterology

## 2020-03-03 DIAGNOSIS — K644 Residual hemorrhoidal skin tags: Secondary | ICD-10-CM | POA: Insufficient documentation

## 2020-03-03 DIAGNOSIS — K625 Hemorrhage of anus and rectum: Secondary | ICD-10-CM | POA: Insufficient documentation

## 2020-03-06 ENCOUNTER — Encounter: Payer: Self-pay | Admitting: Internal Medicine

## 2020-03-06 ENCOUNTER — Other Ambulatory Visit: Payer: Self-pay

## 2020-03-06 ENCOUNTER — Ambulatory Visit (INDEPENDENT_AMBULATORY_CARE_PROVIDER_SITE_OTHER): Payer: Medicare Other | Admitting: Internal Medicine

## 2020-03-06 VITALS — BP 128/74 | HR 73 | Temp 98.8°F | Ht 60.0 in | Wt 161.0 lb

## 2020-03-06 DIAGNOSIS — K5902 Outlet dysfunction constipation: Secondary | ICD-10-CM | POA: Diagnosis not present

## 2020-03-06 DIAGNOSIS — D6869 Other thrombophilia: Secondary | ICD-10-CM

## 2020-03-06 DIAGNOSIS — J0101 Acute recurrent maxillary sinusitis: Secondary | ICD-10-CM | POA: Diagnosis not present

## 2020-03-06 DIAGNOSIS — E785 Hyperlipidemia, unspecified: Secondary | ICD-10-CM

## 2020-03-06 DIAGNOSIS — K649 Unspecified hemorrhoids: Secondary | ICD-10-CM

## 2020-03-06 DIAGNOSIS — I4891 Unspecified atrial fibrillation: Secondary | ICD-10-CM

## 2020-03-06 DIAGNOSIS — F419 Anxiety disorder, unspecified: Secondary | ICD-10-CM

## 2020-03-06 DIAGNOSIS — M25511 Pain in right shoulder: Secondary | ICD-10-CM

## 2020-03-06 DIAGNOSIS — I480 Paroxysmal atrial fibrillation: Secondary | ICD-10-CM

## 2020-03-06 DIAGNOSIS — G8929 Other chronic pain: Secondary | ICD-10-CM

## 2020-03-06 DIAGNOSIS — N1831 Chronic kidney disease, stage 3a: Secondary | ICD-10-CM

## 2020-03-06 NOTE — Patient Instructions (Addendum)
Try the tylenol arthritis or extra strength for your right arm/shoulder.   The maximum amount is 3017m per day.

## 2020-03-06 NOTE — Progress Notes (Signed)
Location:  [facility] clinic Provider:  Michell Kader L. Mariea Clonts, D.O., C.M.D.  Goals of Care:  Advanced Directives 03/06/2020  Does Patient Have a Medical Advance Directive? Yes  Type of Paramedic of [place];Out of facility DNR (pink MOST or yellow form)  Does patient want to make changes to medical advance directive? No - Patient declined  Copy of [place] in Chart? Yes - validated most recent copy scanned in chart (See row information)  Would patient like information on creating a medical advance directive? -  Pre-existing out of facility DNR order (yellow form or pink MOST form) Pink MOST/Yellow Form most recent copy in chart - Physician notified to receive inpatient order   Chief Complaint  Patient presents with  . Medical Management of Chronic Issues    6 week follow up on new medication     HPI: Patient is a 86 y.o. female seen today for medical management of chronic diseases.    2 wks ago, she had bleeding hemorrhoids, then they got inflamed so she's been on ointment for it, but needs suppositories.  She could not insert the ointment b/c she cannot get past it.  Does not want hemorrhoid surgery, but doesn't want this again.  Wants her left ear checked b/c she was blowing her nose after taking her abx.  Feels weird in there.  Sinus congestion is much better.  Still using the saline rinses.  Still has some dry cough.    She is getting to sleep at night, but has to get up to urinate three times.    Takes MOM then has to go to the bathroom at 4-5am.  She's not straining now to go.  Constipation is not new for her.  She's going to try to move the MOM to the middle of the night when she gets up, then maybe her bowels will move in the am.    She thinks her anxiety is better.  Her daughter says no mood swings or outbursts.  Doesn't want more--takes lexapro 30m.  They're using icy hot on her right arm and shoulder--used the wand at [employer] for years  working.  She only takes regular strength tylenol.    Past Medical History:  Diagnosis Date  . Abdominal pain 12/2009   hospitalized  . Allergic rhinitis   . Anxiety   . Bladder problem   . Chronic constipation   . Compression fx, lumbar spine ([place]   . DDD (degenerative disc disease), lumbar   . Diverticulosis of colon   . Dog bite(E906.0) 08/30/2011  . Dysrhythmia   . GERD (gastroesophageal reflux disease)   . Hepatic steatosis    pt denies  . Hyperlipidemia   . Hyperplastic colon polyp   . IBS (irritable bowel syndrome)    constipation predominant  . Internal hemorrhoids   . Osteoporosis    dexa 2011 -2.7 hip nl spine  . PAF (paroxysmal atrial fibrillation) ([place]    a. dx 05/2017.  .Marland KitchenPMR (polymyalgia rheumatica) (HCC) 08/30/2011   under rheum care  on low dose pred 5 mg   . Renal disorder     Past Surgical History:  Procedure Laterality Date  . ABDOMINAL HYSTERECTOMY     with bladder tact and rectocele repair  . BLADDER SURGERY     Streched  . CATARACT EXTRACTION  2010   both  . COLONOSCOPY  2016 was most recent  . CYSTO WITH HYDRODISTENSION N/A 10/05/2019   Procedure: CYSTOSCOPY/HYDRODISTENSION;  Surgeon:  Bjorn Loser, MD;  Location: [facility];  Service: Urology;  Laterality: N/A;  . TONSILLECTOMY AND ADENOIDECTOMY      Allergies  Allergen Reactions  . Alendronate Sodium     upset stomach  . Penicillins     Has patient had a PCN reaction causing immediate rash, facial/tongue/throat swelling, SOB or lightheadedness with hypotension: Yes Has patient had a PCN reaction causing severe rash involving mucus membranes or skin necrosis: No Has patient had a PCN reaction that required hospitalization: No Has patient had a PCN reaction occurring within the last 10 years: No If all of the above answers are "NO", then may proceed with Cephalosporin use.   Marland Kitchen Amoxicillin Rash    Has patient had a PCN reaction causing immediate rash, facial/tongue/throat swelling, SOB or  lightheadedness with hypotension: yes Has patient had a PCN reaction causing severe rash involving mucus membranes or skin necrosis: no Has patient had a PCN reaction that required hospitalization: no Has patient had PCN reaction within the last 10 years: no  If all of the above answers are "NO", then may proceed with Cephalosporin use.  REACTION: unspecified  . Metronidazole Other (See Comments)    Pounding headaches patient says was bad.  With cipro for diverticulitis rx. Other reaction(s): Other (See Comments) Pounding headaches patient says was bad.With cipro for diverticulitis rx.    Outpatient Encounter Medications as of 03/06/2020  Medication Sig  . acetaminophen (TYLENOL) 500 MG tablet Take 500 mg by mouth. Three a week  . Ascorbic Acid (VITAMIN C) 100 MG tablet Take 500 mg by mouth daily.   . calcium carbonate (OS-CAL) 1250 (500 Ca) MG chewable tablet Chew 1 tablet by mouth daily.   Marland Kitchen diltiazem (CARDIZEM CD) 120 MG 24 hr capsule TAKE 1 CAPSULE BY MOUTH EVERY DAY  . ELIQUIS 5 MG TABS tablet TAKE 1 TABLET BY MOUTH TWICE A DAY  . escitalopram (LEXAPRO) 5 MG tablet TAKE 1 TABLET BY MOUTH EVERYDAY AT BEDTIME  . guaiFENesin (MUCINEX) 600 MG 12 hr tablet Take 600 mg by mouth daily.   . hydrocortisone (ANUSOL-HC) 2.5 % rectal cream Place 1 application rectally 3 (three) times daily.  . hydrocortisone (ANUSOL-HC) 25 MG suppository Place 1 suppository (25 mg total) rectally at bedtime.  Marland Kitchen loratadine (CLARITIN) 10 MG tablet Take 10 mg by mouth every other day.   . magnesium hydroxide (MILK OF MAGNESIA) 400 MG/5ML suspension Take 15 mLs by mouth daily as needed for mild constipation.  . Multiple Vitamin (MULTIVITAMIN) tablet Take 1 tablet by mouth daily.  Marland Kitchen PREMARIN vaginal cream INSERT 1 GRAM VAGINALLY THREE TIMES A WEEKLY, THEN ONCE WEEKLY  . Probiotic Product (ALIGN) 4 MG CAPS Take 1 capsule by mouth daily.  . RESTASIS 0.05 % ophthalmic emulsion Place 1 drop into both eyes 2 (two) times  daily.  Marland Kitchen triamcinolone (NASACORT) 55 MCG/ACT AERO nasal inhaler 2 SPRAYS INTO EACH NOSTRIL AT NIGHT  . [DISCONTINUED] azithromycin (ZITHROMAX) 250 MG tablet Take 2 tablets ( 500 mg )  by mouth x 1 dose today then take one ( 250 mg ) by mouth daily x 4 days and stop.   No facility-administered encounter medications on file as of 03/06/2020.    Review of Systems:  Review of Systems  Constitutional: Negative for chills, fever and malaise/fatigue.  HENT: Positive for congestion, ear pain and sinus pain. Negative for sore throat.        Left ear pain since popping; congestion improving  Eyes: Negative for blurred vision.  Respiratory: Positive for cough. Negative for shortness of breath.   Cardiovascular: Negative for chest pain, palpitations and leg swelling.  Gastrointestinal: Positive for constipation. Negative for abdominal pain.       Blood in toilet and on toilet paper, has had inflamed hemorrhoids  Genitourinary: Negative for dysuria.  Musculoskeletal: Positive for joint pain. Negative for back pain, falls, myalgias and neck pain.  Neurological: Negative for dizziness and loss of consciousness.  Endo/Heme/Allergies: Positive for environmental allergies. Bruises/bleeds easily.  Psychiatric/Behavioral: Positive for memory loss. Negative for depression. The patient is nervous/anxious. The patient does not have insomnia.     Health Maintenance  Topic Date Due  . MAMMOGRAM  06/29/2021  . TETANUS/TDAP  08/29/2021  . INFLUENZA VACCINE  Completed  . DEXA SCAN  Completed  . COVID-19 Vaccine  Completed  . PNA vac Low Risk Adult  Completed    Physical Exam: Vitals:   03/06/20 1514  BP: 128/74  Pulse: 73  Temp: 98.8 F (37.1 C)  TempSrc: Temporal  SpO2: 98%  Weight: 161 lb (73 kg)  Height: 5' (1.524 m)   Body mass index is 31.44 kg/m. Physical Exam Vitals reviewed.  Constitutional:      General: She is not in acute distress.    Appearance: Normal appearance. She is obese.  She is not toxic-appearing.  HENT:     Head: Normocephalic and atraumatic.     Right Ear: Tympanic membrane, ear canal and external ear normal.     Left Ear: Tympanic membrane, ear canal and external ear normal.     Nose: Congestion present.     Mouth/Throat:     Pharynx: Oropharyngeal exudate present.  Eyes:     Conjunctiva/sclera: Conjunctivae normal.     Pupils: Pupils are equal, round, and reactive to light.  Cardiovascular:     Rate and Rhythm: Normal rate and regular rhythm.     Pulses: Normal pulses.     Heart sounds: Normal heart sounds.  Pulmonary:     Effort: Pulmonary effort is normal.     Breath sounds: Normal breath sounds. No wheezing, rhonchi or rales.  Abdominal:     General: Bowel sounds are normal.  Musculoskeletal:        General: Normal range of motion.     Right lower leg: No edema.     Left lower leg: No edema.  Skin:    General: Skin is warm and dry.  Neurological:     Mental Status: She is alert. Mental status is at baseline.     Motor: No weakness.     Gait: Gait normal.  Psychiatric:        Mood and Affect: Mood normal.     Labs reviewed: Basic Metabolic Panel: Recent Labs    04/07/19 1552 06/17/19 1422 06/17/19 1947 06/18/19 0326 06/19/19 0353 08/02/19 2035  NA 129*   < >  --  139 137 131*  K 4.1   < >  --  3.2* 3.8 4.2  CL 96   < >  --  105 105 98  CO2 25   < >  --  23 24 23   GLUCOSE 90   < >  --  91 83 96  BUN 8   < >  --  6* 7* 7*  CREATININE 0.87   < >  --  0.86 0.86 0.81  CALCIUM 9.2   < >  --  8.6* 8.6* 9.4  MG  --   --  1.6*  --   --   --  TSH 2.39  --   --   --   --   --    < > = values in this interval not displayed.   Liver Function Tests: Recent Labs    06/17/19 1422 06/18/19 0326 08/02/19 2035  AST 25 17 28   ALT 22 16 22   ALKPHOS 60 48 61  BILITOT 0.8 0.8 0.5  PROT 6.6 5.4* 6.8  ALBUMIN 3.8 3.0* 4.0   Recent Labs    06/17/19 1422 08/02/19 2035  LIPASE 45 30   No results for input(s): AMMONIA in the  last 8760 hours. CBC: Recent Labs    04/07/19 1552 04/07/19 1552 06/17/19 1422 06/18/19 0326 06/19/19 0353 08/02/19 2035 09/29/19 1454  WBC 9.5   < > 11.9*   < > 5.8 7.0 9.1  NEUTROABS 7.0  --  9.3*  --   --   --   --   HGB 13.3   < > 13.7   < > 12.1 13.5 13.6  HCT 39.3   < > 42.0   < > 36.7 39.3 41.3  MCV 95.9   < > 98.4   < > 96.8 94.9 97.2  PLT 241.0   < > 249   < > 217 286 298   < > = values in this interval not displayed.   Lipid Panel: Recent Labs    04/07/19 1552  CHOL 202*  HDL 67.30  TRIG 203.0*  CHOLHDL 3  LDLDIRECT 100.0   No results found for: HGBA1C  Procedures since last visit: No results found.  Assessment/Plan 1. Acute recurrent maxillary sinusitis -improving symptoms - CBC with Differential/Platelet; Future  2. Anxiety -doing better on low dose lexapro  3. Constipation, outlet dysfunction -discussed not using milk of mag on a regular basis due to long-term impact on renal function -recommended miralax occasionally, continue prune juice, adequate hydration and physical activity - COMPLETE METABOLIC PANEL WITH GFR; Future  4. Hemorrhoids, unspecified hemorrhoid type - inflamed and had been bleeding -recommended use of the anusol supps and she's going to get renewal from GI -more bleeding due to eliquis - COMPLETE METABOLIC PANEL WITH GFR; Future - CBC with Differential/Platelet; Future  5. Hyperlipidemia, unspecified hyperlipidemia type - not on medication for this at 72 - COMPLETE METABOLIC PANEL WITH GFR; Future - Lipid panel; Future  6. Hypercoagulable state due to atrial fibrillation ([place]) -continues on eliquis - CBC with Differential/Platelet; Future  7. AF (paroxysmal atrial fibrillation) (HCC) - cont diliazem for rate control and eliquis NOAC - CBC with Differential/Platelet; Future  8. Stage 3a chronic kidney disease (HCC) -Avoid nephrotoxic agents like nsaids, dose adjust renally excreted meds, hydrate - COMPLETE METABOLIC  PANEL WITH GFR; Future - CBC with Differential/Platelet; Future  9. Chronic right shoulder pain -recommended using tylenol arthritis or extra strength and may use up to 3g per day  Labs/tests ordered:   Lab Orders     COMPLETE METABOLIC PANEL WITH GFR     CBC with Differential/Platelet     Lipid panel  Next appt:  07/10/2020 med mgt, fasting labs before  Kayleanna Lorman L. Fain Francis, D.O. [facility] [address], [place] [address] Cell Phone (Mon-Fri 8am-5pm):  [phone] On Call:  [phone] & follow prompts after 5pm & weekends Office Phone:  [phone] Office Fax:  [phone]

## 2020-03-07 ENCOUNTER — Telehealth: Payer: Self-pay | Admitting: Gastroenterology

## 2020-03-07 NOTE — Telephone Encounter (Signed)
Patient notified to continue the suppositories PRN,  She will call back for new GI concerns.

## 2020-03-07 NOTE — Telephone Encounter (Signed)
Inbound call from patient stating she is doing better.  She finished the suppositories and would like to know if she can use the Preparation H suppositories; she used last yesterday and it helped.  She is not bleeding currently.  Is wondering if she needs to come for another visit or what she needs to do.  Please advise.

## 2020-03-17 ENCOUNTER — Telehealth: Payer: Self-pay | Admitting: Gastroenterology

## 2020-03-17 MED ORDER — HYDROCORTISONE ACETATE 25 MG RE SUPP
25.0000 mg | Freq: Every day | RECTAL | 2 refills | Status: DC
Start: 2020-03-17 — End: 2020-10-11

## 2020-03-17 NOTE — Telephone Encounter (Signed)
She wanted more suppositories until she came for her appointment on 04/06/20. She was not having symptoms but wanted some in case she did have symptoms.

## 2020-03-17 NOTE — Telephone Encounter (Signed)
Called patient and let her know to use hydrocortisone cream until appointment if she has any symptoms. I told her to call back on Monday of she starts to have symptoms.

## 2020-03-17 NOTE — Telephone Encounter (Signed)
Inbound call from patient requesting additional refills for hydrocortisone 25 MG suppositories until she comes in for her next appointment scheduled on 04/06/2020.

## 2020-03-17 NOTE — Telephone Encounter (Signed)
I am confused.  What does she need?

## 2020-03-17 NOTE — Telephone Encounter (Signed)
Great. Thank you.

## 2020-03-17 NOTE — Telephone Encounter (Signed)
Called patient and let her know that I sent in the suppositories to her pharmacy

## 2020-04-06 ENCOUNTER — Encounter: Payer: Self-pay | Admitting: Nurse Practitioner

## 2020-04-06 ENCOUNTER — Ambulatory Visit: Payer: Medicare Other | Admitting: Nurse Practitioner

## 2020-04-06 VITALS — BP 110/60 | HR 72 | Ht 60.0 in | Wt 160.4 lb

## 2020-04-06 DIAGNOSIS — K5909 Other constipation: Secondary | ICD-10-CM | POA: Diagnosis not present

## 2020-04-06 DIAGNOSIS — K6289 Other specified diseases of anus and rectum: Secondary | ICD-10-CM | POA: Diagnosis not present

## 2020-04-06 MED ORDER — LUBIPROSTONE 8 MCG PO CAPS: 8.0000 ug | ORAL_CAPSULE | Freq: Two times a day (BID) | ORAL | 3 refills | Status: DC

## 2020-04-06 NOTE — Progress Notes (Signed)
ASSESSMENT AND PLAN    # Chronic constipation with difficult evacuation. Has previously tried fiber and Miralax. MOM works the best keeping stools soft for easy passage. --Patient only taking 15 ml of MOM daily which is probably safe in the absence of renal insufficiency. However it would be preferable to use it more sparingly. Trial of Amitiza 8 mcg BID. If not covered by insurance then may retry daily Miralax and use MOM only 2-3 times a week. We discussed glycerin suppositories but she would like to avoid suppositories for now.  --We discussed toilet posture  / elevation of legs to facilitate passage of stool.    # Hemorrhoids with discomfort.  Improved after a few days of steroid suppositories. On exam she mainly has large fleshy perianal hemorrhoidal tags. She probably has internal hemorrhoids in setting of constipation.    --Treat constipation --Continue steroid suppositories for another 5 days then stop. Avoid overuse of topical steroids    HISTORY OF PRESENT ILLNESS     Primary Gastroenterologist :Silvano Rusk, MD  Chief Complaint : constipation  Ashley Lawson is a 87 y.o. female with PMH / [place] significant for,  but not necessarily limited to: GERD, chronic LLQ pain, chronic constipation / IBS, diverticulosis, atrial fibrillation on Eliquis, anxiety, osteoporosis,   Ashley Lawson is here for evaluation of constipation and hemorrhoids..  She has has had numerous CT scan over the years for LLQ and suprapubic pain which still bothers her. She has a history of IBS. In the past she has taken metamucil and miralax but they both lost efficacy. She gets daily urge to defecate but has difficulty expelling stool. She takes MOM everyday to keep stools soft but wants to be sure that this is safe to do.  She has found that warm prune is also helpful.    Patient says she isn't as active as she used to be but does drink a lot of water.   Previous Endoscopic Evaluations / Pertinent Studies:    June 2016 screening colonoscopy with polypectomies -- Three 92msessile polyps - hyperplastic  Past Medical History:  Diagnosis Date  . Abdominal pain 12/2009   hospitalized  . Allergic rhinitis   . Anxiety   . Bladder problem   . Chronic constipation   . Compression fx, lumbar spine ([place]   . DDD (degenerative disc disease), lumbar   . Diverticulosis of colon   . Dog bite(E906.0) 08/30/2011  . Dysrhythmia   . GERD (gastroesophageal reflux disease)   . Hepatic steatosis    pt denies  . Hyperlipidemia   . Hyperplastic colon polyp   . IBS (irritable bowel syndrome)    constipation predominant  . Internal hemorrhoids   . Osteoporosis    dexa 2011 -2.7 hip nl spine  . PAF (paroxysmal atrial fibrillation) ([place]    a. dx 05/2017.  .Marland KitchenPMR (polymyalgia rheumatica) (HCC) 08/30/2011   under rheum care  on low dose pred 5 mg   . Renal disorder     Current Medications, Allergies, Past Surgical History, Family History and Social History were reviewed in [employer] [facility].   Current Outpatient Medications  Medication Sig Dispense Refill  . acetaminophen (TYLENOL) 500 MG tablet Take 500 mg by mouth. Three a week    . Ascorbic Acid (VITAMIN C) 100 MG tablet Take 500 mg by mouth daily.    . calcium carbonate (OS-CAL) 1250 (500 Ca) MG chewable tablet Chew 1 tablet by mouth daily.     .Marland Kitchen  diltiazem (CARDIZEM CD) 120 MG 24 hr capsule TAKE 1 CAPSULE BY MOUTH EVERY DAY 90 capsule 2  . ELIQUIS 5 MG TABS tablet TAKE 1 TABLET BY MOUTH TWICE A DAY 180 tablet 1  . escitalopram (LEXAPRO) 5 MG tablet TAKE 1 TABLET BY MOUTH EVERYDAY AT BEDTIME 90 tablet 1  . guaiFENesin (MUCINEX) 600 MG 12 hr tablet Take 600 mg by mouth daily.    . hydrocortisone (ANUSOL-HC) 2.5 % rectal cream Place 1 application rectally 3 (three) times daily. 30 g 1  . hydrocortisone (ANUSOL-HC) 25 MG suppository Place 1 suppository (25 mg total) rectally at bedtime. 12 suppository 2  . loratadine (CLARITIN) 10  MG tablet Take 10 mg by mouth every other day.    . magnesium hydroxide (MILK OF MAGNESIA) 400 MG/5ML suspension Take 15 mLs by mouth daily as needed for mild constipation.    . Multiple Vitamin (MULTIVITAMIN) tablet Take 1 tablet by mouth daily.    Marland Kitchen PREMARIN vaginal cream INSERT 1 GRAM VAGINALLY THREE TIMES A WEEKLY, THEN ONCE WEEKLY    . Probiotic Product (ALIGN) 4 MG CAPS Take 1 capsule by mouth daily.    . RESTASIS 0.05 % ophthalmic emulsion Place 1 drop into both eyes 2 (two) times daily.  3  . triamcinolone (NASACORT) 55 MCG/ACT AERO nasal inhaler 2 SPRAYS INTO EACH NOSTRIL AT NIGHT 16.9 each 5   No current facility-administered medications for this visit.    Review of Systems: No chest pain. No shortness of breath. Positive for difficulty initiating urinary flow. .   PHYSICAL EXAM :    Wt Readings from Last 3 Encounters:  04/06/20 160 lb 6.4 oz (72.8 kg)  03/06/20 161 lb (73 kg)  03/01/20 161 lb (73 kg)    BP 110/60   Pulse 72   Ht 5' (1.524 m)   Wt 160 lb 6.4 oz (72.8 kg)   BMI 31.33 kg/m  Constitutional:  Pleasant female in no acute distress. Psychiatric: Normal mood and affect. Behavior is normal. EENT: Pupils normal.  Conjunctivae are normal. No scleral icterus. Neck supple.  Cardiovascular: Normal rate, regular rhythm. No edema Pulmonary/chest: Effort normal and breath sounds normal. No wheezing, rales or rhonchi. Abdominal: Soft, nondistended, mild LLQ tenderness.  Bowel sounds active throughout. There are no masses palpable. No hepatomegaly. Rectal:  Large fleshy hemorrhoid tags.  Neurological: Alert and oriented to person place and time. Skin: Skin is warm and dry. No rashes noted.  Tye Savoy, NP  04/06/2020, 3:14 PM

## 2020-04-06 NOTE — Progress Notes (Incomplete)
ASSESSMENT AND PLAN    # Chronic constipation with difficult evacuation. Has previously tried fiber and Miralax. MOM works the best keeping stools soft for easy passage. --Patient only taking 15 ml of MOM daily which is probably safe in the absence of renal insufficiency. However it would be preferable to have her take MOM as a get her on a better regimen.  Trial of Amitiza 8 mcg BID. If not covered by insurance then may retry daily Miralax and use MOM only 2-3 times a week. We discussed glycerin suppositories but she would rather avoid suppository for now.  --We discussed toilet posture  / elevation of legs to facilitate passage of stool.    # Hemorrhoids with discomfort.  Improved after a few days of steroid suppositories. On exam she mainly has large fleshy perianal hemorrhoidal tags. She probably has internal hemorrhoids in setting of constipation.    --Treat constipation --Continue steroid suppositories for another 5 days then stop. Avoid overuse of topical steroids    --On Eliquis HISTORY OF PRESENT ILLNESS     Primary Gastroenterologist : Chief Complaint :  Ashley Lawson is a 87 y.o. female with PMH / [place] significant for,  but not necessarily limited to: GERD, chronic LLQ pain, chronic constipation / IBS, diverticulosis, atrial fibrillation on Eliquis, anxiety, osteoporosis,   Ashley Lawson is here for evaluation of constipation and hemorrhoids..  She has has had numerous CT scan over the years for LLQ and suprapubic pain which still bothers her. She has a history of IBS. In the past she has taken metamucil and miralax but they both lost efficacy. She gets daily urge to defecate but has difficulty expelling stool. She takes MOM everyday to keep stools soft but wants to be sure that this is safe. She has found that warm prune is also helpful.    Patient says she isn't as active as she used to be but does drink a lot of water.     Previous Endoscopic Evaluations / Pertinent  Studies:   June 2016 screening colonoscopy with polypectomies -- Three 5msessile polyps - hyperplastic  Past Medical History:  Diagnosis Date  . Abdominal pain 12/2009   hospitalized  . Allergic rhinitis   . Anxiety   . Bladder problem   . Chronic constipation   . [place] f[place], [place]   . DDD (degenerative disc disease), lumbar   . Diverticulosis of colon   . Dog bite(E906.0) 08/30/2011  . Dysrhythmia   . GERD (gastroesophageal reflux disease)   . Hepatic steatosis    pt denies  . Hyperlipidemia   . Hyperplastic colon polyp   . IBS (irritable bowel syndrome)    constipation predominant  . Internal hemorrhoids   . Osteoporosis    dexa 2011 -2.7 hip nl spine  . PAF (paroxysmal atrial fibrillation) ([place]    a. dx 05/2017.  .Marland KitchenPMR (polymyalgia rheumatica) (HCC) 08/30/2011   under rheum care  on low dose pred 5 mg   . Renal disorder     Current Medications, Allergies, Past Surgical History, Family History and Social History were reviewed in [facility].   Current Outpatient Medications  Medication Sig Dispense Refill  . acetaminophen (TYLENOL) 500 MG tablet Take 500 mg by mouth. Three a week    . Ascorbic Acid (VITAMIN C) 100 MG tablet Take 500 mg by mouth daily.    . calcium carbonate (OS-CAL) 1250 (500 Ca) MG chewable tablet Chew 1  tablet by mouth daily.     Marland Kitchen diltiazem (CARDIZEM CD) 120 MG 24 hr capsule TAKE 1 CAPSULE BY MOUTH EVERY DAY 90 capsule 2  . ELIQUIS 5 MG TABS tablet TAKE 1 TABLET BY MOUTH TWICE A DAY 180 tablet 1  . escitalopram (LEXAPRO) 5 MG tablet TAKE 1 TABLET BY MOUTH EVERYDAY AT BEDTIME 90 tablet 1  . guaiFENesin (MUCINEX) 600 MG 12 hr tablet Take 600 mg by mouth daily.    . hydrocortisone (ANUSOL-HC) 2.5 % rectal cream Place 1 application rectally 3 (three) times daily. 30 g 1  . hydrocortisone (ANUSOL-HC) 25 MG suppository Place 1 suppository (25 mg total) rectally at bedtime. 12 suppository 2  . loratadine  (CLARITIN) 10 MG tablet Take 10 mg by mouth every other day.    . magnesium hydroxide (MILK OF MAGNESIA) 400 MG/5ML suspension Take 15 mLs by mouth daily as needed for mild constipation.    . Multiple Vitamin (MULTIVITAMIN) tablet Take 1 tablet by mouth daily.    Marland Kitchen PREMARIN vaginal cream INSERT 1 GRAM VAGINALLY THREE TIMES A WEEKLY, THEN ONCE WEEKLY    . Probiotic Product (ALIGN) 4 MG CAPS Take 1 capsule by mouth daily.    . RESTASIS 0.05 % ophthalmic emulsion Place 1 drop into both eyes 2 (two) times daily.  3  . triamcinolone (NASACORT) 55 MCG/ACT AERO nasal inhaler 2 SPRAYS INTO EACH NOSTRIL AT NIGHT 16.9 each 5   No current facility-administered medications for this visit.    Review of Systems: No chest pain. No shortness of breath. No urinary complaints.   PHYSICAL EXAM :    Wt Readings from Last 3 Encounters:  04/06/20 160 lb 6.4 oz (72.8 kg)  03/06/20 161 lb (73 kg)  03/01/20 161 lb (73 kg)    BP 110/60   Pulse 72   Ht 5' (1.524 m)   Wt 160 lb 6.4 oz (72.8 kg)   BMI 31.33 kg/m  Constitutional:  Pleasant female in no acute distress. Psychiatric: Normal mood and affect. Behavior is normal. EENT: Pupils normal.  Conjunctivae are normal. No scleral icterus. Neck supple.  Cardiovascular: Normal rate, regular rhythm. No edema Pulmonary/chest: Effort normal and breath sounds normal. No wheezing, rales or rhonchi. Abdominal: Soft, nondistended, mild LLQ tenderness.  Bowel sounds active throughout. There are no masses palpable. No hepatomegaly. Rectal:  Large fleshy hemorrhoid tags.  Neurological: Alert and oriented to person place and time. Skin: Skin is warm and dry. No rashes noted.  Tye Savoy, NP  04/06/2020, 3:14 PM  Cc:  Referring Provider Mariea Clonts, Tiffany L, DO

## 2020-04-06 NOTE — Patient Instructions (Addendum)
If you are age 85 or older, your body mass index should be between 23-30. Your Body mass index is 31.33 kg/m. If this is out of the aforementioned range listed, please consider follow up with your Primary Care Provider.  If you are age 52 or younger, your body mass index should be between 19-25. Your Body mass index is 31.33 kg/m. If this is out of the aformentioned range listed, please consider follow up with your Primary Care Provider.   START Amitiza 8 mcg 1 capsule twice daily. This has been sent to your pharmacy.   Call the office if you have any issues with insurance or cost of this medication.  Contact the office in a few weeks to let us know how you are doing.  Thank you for entrusting me with your care and choosing [facility].  Tye Savoy, NP-C

## 2020-04-07 ENCOUNTER — Encounter: Payer: Self-pay | Admitting: Nurse Practitioner

## 2020-04-12 ENCOUNTER — Telehealth: Payer: Self-pay | Admitting: Nurse Practitioner

## 2020-04-12 NOTE — Telephone Encounter (Signed)
Spoke with the patient. She calls with complaints of liquid stool and left sided discomfort "touchy." She also says she is "swollen down there." In the past 3 days she has taken daily  BID Amitiza. Yesterday she took MOM, drank prune juice, ate prunes and dates. Today she has a large bowel movement. Now she has diarrhea. She is using wet wipe for self cleaning. How do you want her to proceed from here?

## 2020-04-12 NOTE — Telephone Encounter (Signed)
Patient is advised.  

## 2020-04-12 NOTE — Telephone Encounter (Signed)
Inbound call from patient she finally had a bowel movement this morning.  Wants to know if she needs to come in for another visit or should she keep taking milk of magnesium with he pills that ere prescribed to her.  Please advise.

## 2020-04-12 NOTE — Telephone Encounter (Signed)
I would like for to take just the Amitiza BID. I started that in hopes of getting her off daily MOM. Ask her to take only the [place]. Hold off MOM and prunes. Thanks

## 2020-04-13 NOTE — Telephone Encounter (Signed)
Spoke with the patient. Today has been better. She is not sore at the rectum. Sitz bathes with Epsom's salt has helped. She had a bowel movement this morning. She will continue BID Amitiza. Follow up by phone PRN.

## 2020-04-20 ENCOUNTER — Other Ambulatory Visit: Payer: Self-pay | Admitting: Gastroenterology

## 2020-04-20 NOTE — Telephone Encounter (Signed)
Patient is requesting a call from a nurse regarding previous message

## 2020-04-21 ENCOUNTER — Other Ambulatory Visit: Payer: Self-pay

## 2020-04-21 MED ORDER — HYDROCORTISONE (PERIANAL) 2.5 % EX CREA
1.0000 | TOPICAL_CREAM | Freq: Three times a day (TID) | CUTANEOUS | 0 refills | Status: DC
Start: 2020-04-21 — End: 2020-10-11

## 2020-04-21 NOTE — Telephone Encounter (Signed)
Spoke with the patient. She is better than she was. Would like a refill of the hemorrhoid cream, Anusol. Still uses the sitz bath which has been beneficial. Drying carefully afterwards. Rx to [facility].

## 2020-04-26 ENCOUNTER — Other Ambulatory Visit: Payer: Self-pay

## 2020-04-26 ENCOUNTER — Ambulatory Visit (INDEPENDENT_AMBULATORY_CARE_PROVIDER_SITE_OTHER): Payer: Medicare Other | Admitting: Family

## 2020-04-26 ENCOUNTER — Encounter: Payer: Self-pay | Admitting: Family

## 2020-04-26 ENCOUNTER — Other Ambulatory Visit: Payer: Self-pay | Admitting: Family

## 2020-04-26 VITALS — BP 138/60 | HR 81 | Temp 97.3°F | Resp 16 | Ht 60.0 in | Wt 162.6 lb

## 2020-04-26 DIAGNOSIS — R399 Unspecified symptoms and signs involving the genitourinary system: Secondary | ICD-10-CM

## 2020-04-26 DIAGNOSIS — K5901 Slow transit constipation: Secondary | ICD-10-CM

## 2020-04-26 LAB — POCT URINALYSIS DIPSTICK
Bilirubin, UA: NEGATIVE
Glucose, UA: NEGATIVE
Nitrite, UA: NEGATIVE
Protein, UA: POSITIVE — AB
Spec Grav, UA: 1.02 (ref 1.010–1.025)
Urobilinogen, UA: NEGATIVE E.U./dL — AB
pH, UA: 7.5 (ref 5.0–8.0)

## 2020-04-26 MED ORDER — POLYETHYLENE GLYCOL 3350 17 GM/SCOOP PO POWD: 17.0000 g | Freq: Every day | ORAL | 1 refills | Status: AC

## 2020-04-26 NOTE — Progress Notes (Signed)
Provider: Samiah Ricklefs FNP-C  Gayland Curry, DO  Patient Care Team: Gayland Curry, DO as PCP - General (Geriatric Medicine) Burnell Blanks, MD as PCP - Cardiology (Cardiology) Bjorn Loser, MD (Urology) Particia Nearing, MD (Dermatology) Luberta Mutter, MD (Ophthalmology) Almedia Balls, MD (Orthopedic Surgery) Ouida Sills  (Rheumatology) Jarome Matin, MD as Consulting Physician (Dermatology) Gatha Mayer, MD as Consulting Physician (Gastroenterology) Rozetta Nunnery, MD as Consulting Physician (Otolaryngology)  Extended Emergency Contact Information Primary Emergency Contact: Apple,Karen Address: [address]          [place] [address] [name] of [place] Phone: [phone] Work Phone: [phone] Mobile Phone: [phone] Relation: Daughter  Code Status: Full Code  Goals of care: Advanced Directive information Advanced Directives 04/26/2020  Does Patient Have a Medical Advance Directive? -  Type of Advance Directive [place]  Does patient want to make changes to medical advance directive? No - Patient declined  Copy of [place] in Chart? Yes - validated most recent copy scanned in chart (See row information)  Would patient like information on creating a medical advance directive? -  Pre-existing out of facility DNR order (yellow form or pink MOST form) -     Chief Complaint  Patient presents with  . Acute Visit    Medications for constipation.     HPI:  Pt is a 87 y.o. female seen today for an acute visit for evaluation of constipation. States bowels move every day with MOM,glass of prune juice.she does not go if she does not take medication.she includes fruits but does not eat vegetables.Has not been taking her Amitiza as directed.she would like to try miralax states her sister takes metamucil and miralax.Has been using sitz baths with Epsom salt,Anusol suppositories for hemorrhoids.Has  not had any more bleeding or pain.Wonders whether she needs to continue with suppositories.she request provider to check if hemorrhoids are resolved.  Has had increased urine frequency,urgency and some lower abdominal pain.does not drink enough water.   Past Medical History:  Diagnosis Date  . Abdominal pain 12/2009   hospitalized  . Allergic rhinitis   . Anxiety   . Bladder problem   . Chronic constipation   . Compression fx, lumbar spine ([place])   . DDD (degenerative disc disease), lumbar   . Diverticulosis of colon   . Dog bite(E906.0) 08/30/2011  . Dysrhythmia   . GERD (gastroesophageal reflux disease)   . Hepatic steatosis    pt denies  . Hyperlipidemia   . Hyperplastic colon polyp   . IBS (irritable bowel syndrome)    constipation predominant  . Internal hemorrhoids   . Osteoporosis    dexa 2011 -2.7 hip nl spine  . PAF (paroxysmal atrial fibrillation) ([place])    a. dx 05/2017.  Marland Kitchen PMR (polymyalgia rheumatica) (HCC) 08/30/2011   under rheum care  on low dose pred 5 mg   . Renal disorder    Past Surgical History:  Procedure Laterality Date  . ABDOMINAL HYSTERECTOMY     with bladder tact and rectocele repair  . BLADDER SURGERY     [place]re[place]  . CATARACT EXTRACTION  2010   both  . COLONOSCOPY  2016 was most recent  . CYSTO WITH HYDRODISTENSION N/A 10/05/2019   Procedure: CYSTOSCOPY/HYDRODISTENSION;  Surgeon: Bjorn Loser, MD;  Location: [facility];  Service: Urology;  Laterality: N/A;  . TONSILLECTOMY AND ADENOIDECTOMY      Allergies  Allergen Reactions  . Alendronate Sodium     upset  stomach  . Penicillins     Has patient had a PCN reaction causing immediate rash, facial/tongue/throat swelling, SOB or lightheadedness with hypotension: Yes Has patient had a PCN reaction causing severe rash involving mucus membranes or skin necrosis: No Has patient had a PCN reaction that required hospitalization: No Has patient had a PCN reaction occurring within the last 10 years:  No If all of the above answers are "NO", then may proceed with Cephalosporin use.   Marland Kitchen Amoxicillin Rash    Has patient had a PCN reaction causing immediate rash, facial/tongue/throat swelling, SOB or lightheadedness with hypotension: yes Has patient had a PCN reaction causing severe rash involving mucus membranes or skin necrosis: no Has patient had a PCN reaction that required hospitalization: no Has patient had PCN reaction within the last 10 years: no  If all of the above answers are "NO", then may proceed with Cephalosporin use.  REACTION: unspecified  . Metronidazole Other (See Comments)    Pounding headaches patient says was bad.  With cipro for diverticulitis rx. Other reaction(s): Other (See Comments) Pounding headaches patient says was bad.With cipro for diverticulitis rx.    Outpatient Encounter Medications as of 04/26/2020  Medication Sig  . acetaminophen (TYLENOL) 500 MG tablet Take 500 mg by mouth. Three a week  . Ascorbic Acid (VITAMIN C) 100 MG tablet Take 500 mg by mouth daily.  . calcium carbonate (OS-CAL) 1250 (500 Ca) MG chewable tablet Chew 1 tablet by mouth daily.   Marland Kitchen diltiazem (CARDIZEM CD) 120 MG 24 hr capsule TAKE 1 CAPSULE BY MOUTH EVERY DAY  . ELIQUIS 5 MG TABS tablet TAKE 1 TABLET BY MOUTH TWICE A DAY  . escitalopram (LEXAPRO) 5 MG tablet TAKE 1 TABLET BY MOUTH EVERYDAY AT BEDTIME  . guaiFENesin (MUCINEX) 600 MG 12 hr tablet Take 600 mg by mouth daily.  . hydrocortisone (ANUSOL-HC) 2.5 % rectal cream Place 1 application rectally 3 (three) times daily.  . hydrocortisone (ANUSOL-HC) 25 MG suppository Place 1 suppository (25 mg total) rectally at bedtime.  Marland Kitchen loratadine (CLARITIN) 10 MG tablet Take 10 mg by mouth every other day.  . lubiprostone (AMITIZA) 8 MCG capsule Take 1 capsule (8 mcg total) by mouth 2 (two) times daily with a meal.  . magnesium hydroxide (MILK OF MAGNESIA) 400 MG/5ML suspension Take 15 mLs by mouth daily as needed for mild constipation.  .  Multiple Vitamin (MULTIVITAMIN) tablet Take 1 tablet by mouth daily.  . polyethylene glycol powder (GLYCOLAX/MIRALAX) 17 GM/SCOOP powder Take 17 g by mouth daily. Hold for loose stool  . PREMARIN vaginal cream INSERT 1 GRAM VAGINALLY THREE TIMES A WEEKLY, THEN ONCE WEEKLY  . Probiotic Product (ALIGN) 4 MG CAPS Take 1 capsule by mouth daily.  . RESTASIS 0.05 % ophthalmic emulsion Place 1 drop into both eyes 2 (two) times daily.  Marland Kitchen triamcinolone (NASACORT) 55 MCG/ACT AERO nasal inhaler 2 SPRAYS INTO EACH NOSTRIL AT NIGHT   No facility-administered encounter medications on file as of 04/26/2020.    Review of Systems  Constitutional: Negative for appetite change, chills, fatigue and fever.  Respiratory: Negative for cough, chest tightness, shortness of breath and wheezing.   Cardiovascular: Negative for chest pain, palpitations and leg swelling.  Gastrointestinal: Positive for abdominal pain and constipation. Negative for abdominal distention, blood in stool, diarrhea, nausea, rectal pain and vomiting.       Lower abdominal pain   Genitourinary: Positive for frequency and urgency. Negative for difficulty urinating, dysuria, flank pain and hematuria.  Psychiatric/Behavioral: Negative for agitation, confusion and sleep disturbance. The patient is not nervous/anxious.     Immunization History  Administered Date(s) Administered  . Fluad Quad(high Dose 65+) 01/10/2020  . Influenza Split 01/25/2011, 01/23/2012  . Influenza Whole 12/31/2007  . Influenza, High Dose Seasonal PF 01/19/2013, 01/10/2014, 01/17/2015, 01/18/2016, 01/02/2017, 01/05/2018, 01/05/2019, 01/05/2019  . Influenza-Unspecified 01/02/2017  . PFIZER(Purple Top)SARS-COV-2 Vaccination 05/07/2019, 06/02/2019, 01/31/2020  . Pneumococcal Conjugate-13 02/23/2013  . Pneumococcal Polysaccharide-23 04/01/2006, 04/01/2018  . Td 04/01/2005  . Tdap 08/30/2011  . Zoster 04/02/2007  . Zoster Recombinat (Shingrix) 12/04/2016, 07/27/2017    Pertinent  Health Maintenance Due  Topic Date Due  . MAMMOGRAM  06/29/2021  . INFLUENZA VACCINE  Completed  . DEXA SCAN  Completed  . PNA vac Low Risk Adult  Completed   Fall Risk  04/26/2020 03/06/2020 01/10/2020 11/29/2019 04/07/2019  Falls in the past year? 0 0 0 1 0  Number falls in past yr: 0 0 0 0 0  Injury with Fall? 0 0 0 1 0  Comment - - - Bruise on the side -   Functional Status Survey:    Vitals:   04/26/20 1342  BP: 138/60  Pulse: 81  Resp: 16  Temp: (!) 97.3 F (36.3 C)  SpO2: 98%  Weight: 162 lb 9.6 oz (73.8 kg)  Height: 5' (1.524 m)   Body mass index is 31.76 kg/m. Physical Exam Vitals reviewed.  Constitutional:      General: She is not in acute distress.    Appearance: She is obese. She is not ill-appearing.  HENT:     Head: Normocephalic.  Cardiovascular:     Rate and Rhythm: Normal rate and regular rhythm.     Pulses: Normal pulses.     Heart sounds: Normal heart sounds. No murmur heard. No friction rub. No gallop.   Pulmonary:     Effort: Pulmonary effort is normal. No respiratory distress.     Breath sounds: Normal breath sounds. No wheezing, rhonchi or rales.  Chest:     Chest wall: No tenderness.  Abdominal:     General: Bowel sounds are normal. There is no distension.     Palpations: Abdomen is soft. There is no mass.     Tenderness: There is no abdominal tenderness. There is no right CVA tenderness, left CVA tenderness, guarding or rebound.  Neurological:     Mental Status: She is alert and oriented to person, place, and time.     Gait: Gait normal.  Psychiatric:        Mood and Affect: Mood normal.        Behavior: Behavior normal.        Thought Content: Thought content normal.        Judgment: Judgment normal.    Labs reviewed: Recent Labs    06/17/19 1947 06/18/19 0326 06/19/19 0353 08/02/19 2035  NA  --  139 137 131*  K  --  3.2* 3.8 4.2  CL  --  105 105 98  CO2  --  23 24 23   GLUCOSE  --  91 83 96  BUN  --  6* 7* 7*   CREATININE  --  0.86 0.86 0.81  CALCIUM  --  8.6* 8.6* 9.4  MG 1.6*  --   --   --    Recent Labs    06/17/19 1422 06/18/19 0326 08/02/19 2035  AST 25 17 28   ALT 22 16 22   ALKPHOS 60 48 61  BILITOT 0.8  0.8 0.5  PROT 6.6 5.4* 6.8  ALBUMIN 3.8 3.0* 4.0   Recent Labs    06/17/19 1422 06/18/19 0326 06/19/19 0353 08/02/19 2035 09/29/19 1454  WBC 11.9*   < > 5.8 7.0 9.1  NEUTROABS 9.3*  --   --   --   --   HGB 13.7   < > 12.1 13.5 13.6  HCT 42.0   < > 36.7 39.3 41.3  MCV 98.4   < > 96.8 94.9 97.2  PLT 249   < > 217 286 298   < > = values in this interval not displayed.   Lab Results  Component Value Date   TSH 2.39 04/07/2019   No results found for: HGBA1C Lab Results  Component Value Date   CHOL 202 (H) 04/07/2019   HDL 67.30 04/07/2019   LDLCALC 105 (H) 04/08/2018   LDLDIRECT 100.0 04/07/2019   TRIG 203.0 (H) 04/07/2019   CHOLHDL 3 04/07/2019    Significant Diagnostic Results in last 30 days:  No results found.  Assessment/Plan 1. Constipation, slow transit Reports going regularly but has sometimes has hard stool.Not taking Amitiza as directed.would like to try miralax. - Advised to increase vegetables and fruits in her diet. - continue with warm prune juice  - Increase water intake to 6-8 glasses daily  - Increase exercise to at least 30 minutes three times per week  -miralax 17 gram mix in  8 oz of fluid and drink daily may hold if having loose stool.  - polyethylene glycol powder (GLYCOLAX/MIRALAX) 17 GM/SCOOP powder; Take 17 g by mouth daily. Hold for loose stool  Dispense: 3350 g; Refill: 1  2. Symptoms of urinary tract infection Afebrile.Reports increased,frequency,urgency and lower abdominal pain. - increase water intake as above and cut fluid intake in the evening to avoid getting up at night.  - POC Urinalysis Dipstick - Urine Culture  Family/ staff Communication: Reviewed plan of care with patient   Labs/tests ordered:  - POC Urinalysis  Dipstick - Urine Culture  Next Appointment: As needed if symptoms worsen or fail to improve.   Sandrea Hughs, NP

## 2020-04-26 NOTE — Patient Instructions (Addendum)
-   Increase vegetables and fruits in her diet. - continue with warm prune juice  - Increase water intake to 6-8 glasses daily  - Increase exercise to at least 30 minutes three times per week  -miralax 17 gram mix in  8 oz of fluid and drink daily may hold if having loose stool. Constipation, Adult Constipation is when a person has trouble pooping (having a bowel movement). When you have this condition, you may poop fewer than 3 times a week. Your poop (stool) may also be dry, hard, or bigger than normal. Follow these instructions at home: Eating and drinking  Eat foods that have a lot of fiber, such as: ? Fresh fruits and vegetables. ? Whole grains. ? Beans.  Eat less of foods that are low in fiber and high in fat and sugar, such as: ? [place] fries. ? Hamburgers. ? Cookies. ? Candy. ? Soda.  Drink enough fluid to keep your pee (urine) pale yellow.   General instructions  Exercise regularly or as told by your doctor. Try to do 150 minutes of exercise each week.  Go to the restroom when you feel like you need to poop. Do not hold it in.  Take over-the-counter and prescription medicines only as told by your doctor. These include any fiber supplements.  When you poop: ? Do deep breathing while relaxing your lower belly (abdomen). ? Relax your pelvic floor. The pelvic floor is a group of muscles that support the rectum, bladder, and intestines (as well as the uterus in women).  Watch your condition for any changes. Tell your doctor if you notice any.  Keep all follow-up visits as told by your doctor. This is important. Contact a doctor if:  You have pain that gets worse.  You have a fever.  You have not pooped for 4 days.  You vomit.  You are not hungry.  You lose weight.  You are bleeding from the opening of the butt (anus).  You have thin, pencil-like poop. Get help right away if:  You have a fever, and your symptoms suddenly get worse.  You leak poop or have  blood in your poop.  Your belly feels hard or bigger than normal (bloated).  You have very bad belly pain.  You feel dizzy or you faint. Summary  Constipation is when a person poops fewer than 3 times a week, has trouble pooping, or has poop that is dry, hard, or bigger than normal.  Eat foods that have a lot of fiber.  Drink enough fluid to keep your pee (urine) pale yellow.  Take over-the-counter and prescription medicines only as told by your doctor. These include any fiber supplements. This information is not intended to replace advice given to you by your health care provider. Make sure you discuss any questions you have with your health care provider. Document Revised: 02/03/2019 Document Reviewed: 02/03/2019 Elsevier Patient Education  [place].

## 2020-04-28 LAB — URINE CULTURE
MICRO NUMBER:: 11463802
Result:: NO GROWTH
SPECIMEN QUALITY:: ADEQUATE

## 2020-04-28 NOTE — Progress Notes (Signed)
Urine culture has no growth.

## 2020-05-01 ENCOUNTER — Other Ambulatory Visit: Payer: Self-pay | Admitting: Cardiovascular Disease

## 2020-05-01 NOTE — Telephone Encounter (Signed)
Prescription refill request for Eliquis received.  Indication: Afib Last office visit: [place], 09/06/2019 Scr: 0.81, 08/02/2019 Age: 87 yo  Weight: 73.8 kg   Prescription refill sent.

## 2020-05-02 ENCOUNTER — Telehealth: Payer: Self-pay | Admitting: Nurse Practitioner

## 2020-05-02 NOTE — Telephone Encounter (Signed)
Spoke with the patient. She is concerned the Amitiza may be causing her LE edema. She has seen her geriatrician since her last visit here. A plan was decided using Miralax.  Patient wants to know if she can use Miralax and if needed MOM. Per last office visit here, MOM no more than 2 to 3 times a week. May use prunes in addition to Miralax and hopefully will not need MOM.  Reinforced the plan discussed at her visit with her PCP.

## 2020-05-02 NOTE — Telephone Encounter (Signed)
Pt is requesting a call back from a nurse to discuss the reaction she is experiencing from her [place]. Pt states her legs swell up and headaches.

## 2020-05-12 ENCOUNTER — Ambulatory Visit: Payer: Medicare Other | Admitting: Family

## 2020-05-15 ENCOUNTER — Other Ambulatory Visit: Payer: Self-pay

## 2020-05-15 ENCOUNTER — Other Ambulatory Visit: Payer: Self-pay | Admitting: Internal Medicine

## 2020-05-15 ENCOUNTER — Ambulatory Visit (INDEPENDENT_AMBULATORY_CARE_PROVIDER_SITE_OTHER): Payer: Medicare Other | Admitting: Family

## 2020-05-15 ENCOUNTER — Encounter: Payer: Self-pay | Admitting: Family

## 2020-05-15 VITALS — BP 118/70 | HR 76 | Temp 97.7°F | Resp 16 | Ht 60.0 in | Wt 163.6 lb

## 2020-05-15 DIAGNOSIS — R21 Rash and other nonspecific skin eruption: Secondary | ICD-10-CM | POA: Diagnosis not present

## 2020-05-15 MED ORDER — PREDNISONE 10 MG PO TABS: ORAL_TABLET | ORAL | 0 refills | Status: DC

## 2020-05-15 NOTE — Progress Notes (Signed)
Provider: Eldene Plocher FNP-C  Gayland Curry, DO  Patient Care Team: Gayland Curry, DO as PCP - General (Geriatric Medicine) Burnell Blanks, MD as PCP - Cardiology (Cardiology) Bjorn Loser, MD (Urology) Particia Nearing, MD (Dermatology) Luberta Mutter, MD (Ophthalmology) Almedia Balls, MD (Orthopedic Surgery) Ouida Sills  (Rheumatology) Jarome Matin, MD as Consulting Physician (Dermatology) Gatha Mayer, MD as Consulting Physician (Gastroenterology) Rozetta Nunnery, MD as Consulting Physician (Otolaryngology)  Extended Emergency Contact Information Primary Emergency Contact: Apple,Karen Address: [address]          [place] [address] [name] Phone: [phone] Work Phone: [phone] Mobile Phone: [phone] Relation: Daughter  Code Status:  DNR Goals of care: Advanced Directive information Advanced Directives 05/15/2020  Does Patient Have a Medical Advance Directive? Yes  Type of Advance Directive [place]  Does patient want to make changes to medical advance directive? No - Patient declined  Copy of [place] in Chart? Yes - validated most recent copy scanned in chart (See row information)  Would patient like information on creating a medical advance directive? -  Pre-existing out of facility DNR order (yellow form or pink MOST form) -     Chief Complaint  Patient presents with  . Acute Visit    Complains of breakout on hands and legs.    HPI:  Pt is a 87 y.o. female seen today for an acute visit for evaluation of breakout on hands and legs x 1 week.Took medication Amitiza for constipation twice daily Presribed by GI specialist states made her break out.Rash started one week after starting on Amitiza Has stopped taking the medication.rash first started on the legs then spread to the hands.arms.rash described as red and itchy.Has improved today.Also had swelling on ankles  which is better today. She denies any change in soap,lotion or laundry detergent.does not recall eating any new type of food that could have made her breakout. Has had no nausea,vomiting,fever or chills. She has used Cetaphil lotion without any relief.  States bowels has been moving well with use of prunes,prune juice and miralax.   Past Medical History:  Diagnosis Date  . Abdominal pain 12/2009   hospitalized  . Allergic rhinitis   . Anxiety   . Bladder problem   . Chronic constipation   . Compression fx, lumbar spine ([place])   . DDD (degenerative disc disease), lumbar   . Diverticulosis of colon   . Dog bite(E906.0) 08/30/2011  . Dysrhythmia   . GERD (gastroesophageal reflux disease)   . Hepatic steatosis    pt denies  . Hyperlipidemia   . Hyperplastic colon polyp   . IBS (irritable bowel syndrome)    constipation predominant  . Internal hemorrhoids   . Osteoporosis    dexa 2011 -2.7 hip nl spine  . PAF (paroxysmal atrial fibrillation) ([facility])    a. dx 05/2017.  Marland Kitchen PMR (polymyalgia rheumatica) ([facility]) 08/30/2011   under rheum care  on low dose pred 5 mg   . Renal disorder    Past Surgical History:  Procedure Laterality Date  . ABDOMINAL HYSTERECTOMY     with bladder tact and rectocele repair  . BLADDER SURGERY     Streched  . CATARACT EXTRACTION  2010   both  . COLONOSCOPY  2016 was most recent  . CYSTO WITH HYDRODISTENSION N/A 10/05/2019   Procedure: CYSTOSCOPY/HYDRODISTENSION;  Surgeon: Bjorn Loser, MD;  Location: [facility];  Service: Urology;  Laterality: N/A;  . TONSILLECTOMY  AND ADENOIDECTOMY      Allergies  Allergen Reactions  . Alendronate Sodium     upset stomach  . Penicillins     Has patient had a PCN reaction causing immediate rash, facial/tongue/throat swelling, SOB or lightheadedness with hypotension: Yes Has patient had a PCN reaction causing severe rash involving mucus membranes or skin necrosis: No Has patient had a PCN reaction that required  hospitalization: No Has patient had a PCN reaction occurring within the last 10 years: No If all of the above answers are "NO", then may proceed with Cephalosporin use.   Marland Kitchen Amoxicillin Rash    Has patient had a PCN reaction causing immediate rash, facial/tongue/throat swelling, SOB or lightheadedness with hypotension: yes Has patient had a PCN reaction causing severe rash involving mucus membranes or skin necrosis: no Has patient had a PCN reaction that required hospitalization: no Has patient had PCN reaction within the last 10 years: no  If all of the above answers are "NO", then may proceed with Cephalosporin use.  REACTION: unspecified  . Metronidazole Other (See Comments)    Pounding headaches patient says was bad.  With cipro for diverticulitis rx. Other reaction(s): Other (See Comments) Pounding headaches patient says was bad.With cipro for diverticulitis rx.    Outpatient Encounter Medications as of 05/15/2020  Medication Sig  . acetaminophen (TYLENOL) 500 MG tablet Take 500 mg by mouth. Three a week  . Ascorbic Acid (VITAMIN C) 100 MG tablet Take 500 mg by mouth daily.  . calcium carbonate (OS-CAL) 1250 (500 Ca) MG chewable tablet Chew 1 tablet by mouth daily.   Marland Kitchen diltiazem (CARDIZEM CD) 120 MG 24 hr capsule TAKE 1 CAPSULE BY MOUTH EVERY DAY  . ELIQUIS 5 MG TABS tablet TAKE 1 TABLET BY MOUTH TWICE A DAY  . escitalopram (LEXAPRO) 5 MG tablet TAKE 1 TABLET BY MOUTH EVERYDAY AT BEDTIME  . guaiFENesin (MUCINEX) 600 MG 12 hr tablet Take 600 mg by mouth daily.  . hydrocortisone (ANUSOL-HC) 2.5 % rectal cream Place 1 application rectally 3 (three) times daily.  . hydrocortisone (ANUSOL-HC) 25 MG suppository Place 1 suppository (25 mg total) rectally at bedtime.  Marland Kitchen loratadine (CLARITIN) 10 MG tablet Take 10 mg by mouth every other day.  . magnesium hydroxide (MILK OF MAGNESIA) 400 MG/5ML suspension Take 15 mLs by mouth daily as needed for mild constipation.  . Multiple Vitamin  (MULTIVITAMIN) tablet Take 1 tablet by mouth daily.  . polyethylene glycol powder (GLYCOLAX/MIRALAX) 17 GM/SCOOP powder Take 17 g by mouth daily. Hold for loose stool  . PREMARIN vaginal cream INSERT 1 GRAM VAGINALLY THREE TIMES A WEEKLY, THEN ONCE WEEKLY  . Probiotic Product (ALIGN) 4 MG CAPS Take 1 capsule by mouth daily.  . RESTASIS 0.05 % ophthalmic emulsion Place 1 drop into both eyes 2 (two) times daily.  Marland Kitchen triamcinolone (NASACORT) 55 MCG/ACT AERO nasal inhaler 2 SPRAYS INTO EACH NOSTRIL AT NIGHT  . [DISCONTINUED] lubiprostone (AMITIZA) 8 MCG capsule Take 1 capsule (8 mcg total) by mouth 2 (two) times daily with a meal.   No facility-administered encounter medications on file as of 05/15/2020.    Review of Systems  Constitutional: Negative for appetite change, chills, fatigue and fever.  HENT: Negative for congestion, rhinorrhea, sinus pressure, sinus pain, sneezing, sore throat and trouble swallowing.   Eyes: Negative for pain, discharge, redness and itching.  Respiratory: Negative for cough, chest tightness, shortness of breath and wheezing.   Cardiovascular: Negative for chest pain, palpitations and leg swelling.  Gastrointestinal: Negative for abdominal distention, abdominal pain, diarrhea, nausea and vomiting.  Musculoskeletal: Negative for gait problem, joint swelling, myalgias and neck pain.  Skin: Negative for color change and pallor.       Rash per HPI   Neurological: Negative for dizziness, speech difficulty, light-headedness and headaches.  Hematological: Does not bruise/bleed easily.  Psychiatric/Behavioral: Negative for agitation and confusion.    Immunization History  Administered Date(s) Administered  . Fluad Quad(high Dose 65+) 01/10/2020  . Influenza Split 01/25/2011, 01/23/2012  . Influenza Whole 12/31/2007  . Influenza, High Dose Seasonal PF 01/19/2013, 01/10/2014, 01/17/2015, 01/18/2016, 01/02/2017, 01/05/2018, 01/05/2019, 01/05/2019  . Influenza-Unspecified  01/02/2017  . PFIZER(Purple Top)SARS-COV-2 Vaccination 05/07/2019, 06/02/2019, 01/31/2020  . Pneumococcal Conjugate-13 02/23/2013  . Pneumococcal Polysaccharide-23 04/01/2006, 04/01/2018  . Td 04/01/2005  . Tdap 08/30/2011  . Zoster 04/02/2007  . Zoster Recombinat (Shingrix) 12/04/2016, 07/27/2017   Pertinent  Health Maintenance Due  Topic Date Due  . MAMMOGRAM  06/29/2021  . INFLUENZA VACCINE  Completed  . DEXA SCAN  Completed  . PNA vac Low Risk Adult  Completed   Fall Risk  05/15/2020 04/26/2020 03/06/2020 01/10/2020 11/29/2019  Falls in the past year? 0 0 0 0 1  Number falls in past yr: 0 0 0 0 0  Injury with Fall? 0 0 0 0 1  Comment - - - - Bruise on the side   Functional Status Survey:    Vitals:   05/15/20 1511  BP: 118/70  Pulse: 76  Resp: 16  Temp: 97.7 F (36.5 C)  SpO2: 98%  Weight: 163 lb 9.6 oz (74.2 kg)  Height: 5' (1.524 m)   Body mass index is 31.95 kg/m. Physical Exam Vitals reviewed.  Constitutional:      General: She is not in acute distress.    Appearance: She is obese. She is not ill-appearing.  HENT:     Head: Normocephalic.  Eyes:     General: No scleral icterus.       Right eye: No discharge.        Left eye: No discharge.     Conjunctiva/sclera: Conjunctivae normal.     Pupils: Pupils are equal, round, and reactive to light.  Cardiovascular:     Rate and Rhythm: Normal rate and regular rhythm.     Pulses: Normal pulses.     Heart sounds: Normal heart sounds. No murmur heard. No friction rub. No gallop.   Pulmonary:     Effort: Pulmonary effort is normal. No respiratory distress.     Breath sounds: Normal breath sounds. No wheezing, rhonchi or rales.  Chest:     Chest wall: No tenderness.  Abdominal:     General: Bowel sounds are normal. There is no distension.     Palpations: Abdomen is soft. There is no mass.     Tenderness: There is no abdominal tenderness. There is no right CVA tenderness, left CVA tenderness, guarding or  rebound.  Musculoskeletal:        General: No swelling or tenderness. Normal range of motion.     Comments: Bilateral lower extremities trace edema.   Skin:    General: Skin is warm and dry.     Coloration: Skin is not pale.     Findings: No erythema.          Comments: Multiple site on chest,arms and legs red blanchable non- raised rash.  Neurological:     Mental Status: She is alert and oriented to person, place, and time.  Cranial Nerves: No cranial nerve deficit.     Motor: No weakness.     Gait: Gait normal.  Psychiatric:        Mood and Affect: Mood normal.        Behavior: Behavior normal.        Thought Content: Thought content normal.        Judgment: Judgment normal.     Labs reviewed: Recent Labs    06/17/19 1947 06/18/19 0326 06/19/19 0353 08/02/19 2035  NA  --  139 137 131*  K  --  3.2* 3.8 4.2  CL  --  105 105 98  CO2  --  23 24 23   GLUCOSE  --  91 83 96  BUN  --  6* 7* 7*  CREATININE  --  0.86 0.86 0.81  CALCIUM  --  8.6* 8.6* 9.4  MG 1.6*  --   --   --    Recent Labs    06/17/19 1422 06/18/19 0326 08/02/19 2035  AST 25 17 28   ALT 22 16 22   ALKPHOS 60 48 61  BILITOT 0.8 0.8 0.5  PROT 6.6 5.4* 6.8  ALBUMIN 3.8 3.0* 4.0   Recent Labs    06/17/19 1422 06/18/19 0326 06/19/19 0353 08/02/19 2035 09/29/19 1454  WBC 11.9*   < > 5.8 7.0 9.1  NEUTROABS 9.3*  --   --   --   --   HGB 13.7   < > 12.1 13.5 13.6  HCT 42.0   < > 36.7 39.3 41.3  MCV 98.4   < > 96.8 94.9 97.2  PLT 249   < > 217 286 298   < > = values in this interval not displayed.   Lab Results  Component Value Date   TSH 2.39 04/07/2019   No results found for: HGBA1C Lab Results  Component Value Date   CHOL 202 (H) 04/07/2019   HDL 67.30 04/07/2019   LDLCALC 105 (H) 04/08/2018   LDLDIRECT 100.0 04/07/2019   TRIG 203.0 (H) 04/07/2019   CHOLHDL 3 04/07/2019    Significant Diagnostic Results in last 30 days:  No results found.  Assessment/Plan   Rash and  nonspecific skin eruption Multiple site on chest,arms and legs red blanchable non- raised rash.Unclear etiology though reports rash as reaction after starting on Amitiza prescribed by GI specialist.No shortness of breath or wheezing. Discussed treatment with prednisone to relief symptoms.side effects discussed including increased appetite,insomnia and irritability verbalized understanding.Has take Prednisone in the past.    - predniSONE (DELTASONE) 10 MG tablet; Take 40 mg ( 4 tablets ) one by mouth daily x 1 day then  Take 30 mg ( 3 tablets ) one by mouth daily x 1 dose then Take 20 mg ( 2 Tablet ) one by mouth daily x 1 dose then  Take 10 mg ( 1Tablet ) one by mouth daily x 1 dose then  Take 1/2 Tablet by mouth x 1 dose and stop.  Dispense: 11 tablet; Refill: 0 - Increase water intake to 6-8 glasses  - Notify provider if symptoms worsen or fail to improve.   Family/ staff Communication: Reviewed plan of care with patient and daughter verbalized understanding.   Labs/tests ordered: None   Next Appointment: As needed if symptoms worsen or fail to improve.   Sandrea Hughs, NP

## 2020-05-15 NOTE — Patient Instructions (Signed)
-   Prednisone Take 40 mg ( 4 tablets ) one by mouth daily x 1 day then  Take 30 mg ( 3 tablets ) one by mouth daily x 1 dose then Take 20 mg ( 2 Tablet ) one by mouth daily x 1 dose then  Take 10 mg ( 1Tablet ) one by mouth daily x 1 dose then  Take 1/2 Tablet by mouth x 1 dose and stop.   - Increase water intake to 6-8 glasses  - Notify provider if symptoms worsen or fail to improve.

## 2020-05-18 ENCOUNTER — Ambulatory Visit: Payer: Medicare Other | Admitting: Cardiovascular Disease

## 2020-05-18 ENCOUNTER — Encounter: Payer: Self-pay | Admitting: Cardiovascular Disease

## 2020-05-18 ENCOUNTER — Other Ambulatory Visit: Payer: Self-pay

## 2020-05-18 VITALS — BP 118/64 | HR 65 | Ht 60.0 in | Wt 159.4 lb

## 2020-05-18 DIAGNOSIS — I480 Paroxysmal atrial fibrillation: Secondary | ICD-10-CM

## 2020-05-18 DIAGNOSIS — I340 Nonrheumatic mitral (valve) insufficiency: Secondary | ICD-10-CM | POA: Diagnosis not present

## 2020-05-18 NOTE — Patient Instructions (Signed)
Medication Instructions:  No changes *If you need a refill on your cardiac medications before your next appointment, please call your pharmacy*   Lab Work: none If you have labs (blood work) drawn today and your tests are completely normal, you will receive your results only by: Marland Kitchen MyChart Message (if you have MyChart) OR . A paper copy in the mail If you have any lab test that is abnormal or we need to change your treatment, we will call you to review the results.   Testing/Procedures: none   Follow-Up: At [facility], you and your health needs are our priority.  As part of our continuing mission to provide you with exceptional heart care, we have created designated Provider Care Teams.  These Care Teams include your primary Cardiologist (physician) and Advanced Practice Providers (APPs -  Physician Assistants and Nurse Practitioners) who all work together to provide you with the care you need, when you need it.  We recommend signing up for the patient portal called "MyChart".  Sign up information is provided on this After Visit Summary.  MyChart is used to connect with patients for Virtual Visits (Telemedicine).  Patients are able to view lab/test results, encounter notes, upcoming appointments, etc.  Non-urgent messages can be sent to your provider as well.   To learn more about what you can do with MyChart, go to [email or web address].    Your next appointment:   12 month(s)  The format for your next appointment:   In Person  Provider:   You may see Lauree Chandler, MD or one of the following Advanced Practice Providers on your designated Care Team:    Melina Copa, PA-C  Ermalinda Barrios, PA-C    Other Instructions

## 2020-05-18 NOTE — Progress Notes (Signed)
Chief Complaint  Patient presents with  . Follow-up    PAF   History of Present Illness: 87 yo female with history of GERD, diverticulosis, IBS, HLD, PMR and paroxysmal atrial fibrillation who is here today for follow up. She was admitted to [facility] in January 2019 with a UTI and sinus infection and was found to be in atrial fibrillation. She was started on a beta blocker and Eliquis and converted to sinus. She was treated for hyponatremia. Echo 05/02/17 with LVEF=55-60%, mild MR. TSH was normal. She was seen in our office for follow up 05/2717 and was doing well but having some fatigue. Her beta blocker was stopped and Cardizem was started. She was admitted to [facility] June 2020 with vertigo and was treated with meclizine. She was in sinus. She was admitted to [facility] March 2021 with syncope in the setting of severe abdominal pain during a bowel movement. Telemetry showed transient rate controlled atrial fibrillation. Echo 06/18/19 with LVEF=65-70%. No valve disease. Her syncope was felt to be a vasovagal event. Cardiac monitor with atrial fib but no pauses.   She is here today for follow up. The patient denies any chest pain, dyspnea, palpitations, lower extremity edema, orthopnea, PND, dizziness, near syncope or syncope. She recently started a new medication for constipation and had a rash. She is now on prednisone and is doing better.   Primary Care Physician: Gayland Curry, DO  Past Medical History:  Diagnosis Date  . Abdominal pain 12/2009   hospitalized  . Allergic rhinitis   . Anxiety   . Bladder problem   . Chronic constipation   . Compression fx, lumbar spine ([place])   . DDD (degenerative disc disease), lumbar   . Diverticulosis of colon   . Dog bite(E906.0) 08/30/2011  . Dysrhythmia   . GERD (gastroesophageal reflux disease)   . Hepatic steatosis    pt denies  . Hyperlipidemia   . Hyperplastic colon polyp   . IBS (irritable bowel syndrome)    constipation predominant  . Internal  hemorrhoids   . Osteoporosis    dexa 2011 -2.7 hip nl spine  . PAF (paroxysmal atrial fibrillation) ([place])    a. dx 05/2017.  Marland Kitchen PMR (polymyalgia rheumatica) ([facility]) 08/30/2011   under rheum care  on low dose pred 5 mg   . Renal disorder     Past Surgical History:  Procedure Laterality Date  . ABDOMINAL HYSTERECTOMY     with bladder tact and rectocele repair  . BLADDER SURGERY     Streched  . CATARACT EXTRACTION  2010   both  . COLONOSCOPY  2016 was most recent  . CYSTO WITH HYDRODISTENSION N/A 10/05/2019   Procedure: CYSTOSCOPY/HYDRODISTENSION;  Surgeon: Bjorn Loser, MD;  Location: [facility];  Service: Urology;  Laterality: N/A;  . TONSILLECTOMY AND ADENOIDECTOMY      Current Outpatient Medications  Medication Sig Dispense Refill  . acetaminophen (TYLENOL) 500 MG tablet Take 500 mg by mouth. Three a week    . Ascorbic Acid (VITAMIN C) 100 MG tablet Take 500 mg by mouth daily.    . calcium carbonate (OS-CAL) 1250 (500 Ca) MG chewable tablet Chew 1 tablet by mouth daily.     Marland Kitchen diltiazem (CARDIZEM CD) 120 MG 24 hr capsule TAKE 1 CAPSULE BY MOUTH EVERY DAY 90 capsule 2  . ELIQUIS 5 MG TABS tablet TAKE 1 TABLET BY MOUTH TWICE A DAY 180 tablet 1  . escitalopram (LEXAPRO) 5 MG tablet TAKE 1 TABLET BY MOUTH  EVERYDAY AT BEDTIME 90 tablet 1  . guaiFENesin (MUCINEX) 600 MG 12 hr tablet Take 600 mg by mouth daily.    . hydrocortisone (ANUSOL-HC) 2.5 % rectal cream Place 1 application rectally 3 (three) times daily. 30 g 0  . hydrocortisone (ANUSOL-HC) 25 MG suppository Place 1 suppository (25 mg total) rectally at bedtime. 12 suppository 2  . loratadine (CLARITIN) 10 MG tablet Take 10 mg by mouth every other day.    . magnesium hydroxide (MILK OF MAGNESIA) 400 MG/5ML suspension Take 15 mLs by mouth daily as needed for mild constipation.    . Multiple Vitamin (MULTIVITAMIN) tablet Take 1 tablet by mouth daily.    . polyethylene glycol powder (GLYCOLAX/MIRALAX) 17 GM/SCOOP powder Take 17 g by  mouth daily. Hold for loose stool 3350 g 1  . predniSONE (DELTASONE) 10 MG tablet Take 40 mg ( 4 tablets ) one by mouth daily x 1 day then  Take 30 mg ( 3 tablets ) one by mouth daily x 1 dose then Take 20 mg ( 2 Tablet ) one by mouth daily x 1 dose then  Take 10 mg ( 1Tablet ) one by mouth daily x 1 dose then  Take 1/2 Tablet by mouth x 1 dose and stop. 11 tablet 0  . PREMARIN vaginal cream INSERT 1 GRAM VAGINALLY THREE TIMES A WEEKLY, THEN ONCE WEEKLY    . Probiotic Product (ALIGN) 4 MG CAPS Take 1 capsule by mouth daily.    . RESTASIS 0.05 % ophthalmic emulsion Place 1 drop into both eyes 2 (two) times daily.  3  . triamcinolone (NASACORT) 55 MCG/ACT AERO nasal inhaler 2 SPRAYS INTO EACH NOSTRIL AT NIGHT 16.9 each 5   No current facility-administered medications for this visit.    Allergies  Allergen Reactions  . Alendronate Sodium     upset stomach  . Penicillins     Has patient had a PCN reaction causing immediate rash, facial/tongue/throat swelling, SOB or lightheadedness with hypotension: Yes Has patient had a PCN reaction causing severe rash involving mucus membranes or skin necrosis: No Has patient had a PCN reaction that required hospitalization: No Has patient had a PCN reaction occurring within the last 10 years: No If all of the above answers are "NO", then may proceed with Cephalosporin use.   Marland Kitchen Amoxicillin Rash    Has patient had a PCN reaction causing immediate rash, facial/tongue/throat swelling, SOB or lightheadedness with hypotension: yes Has patient had a PCN reaction causing severe rash involving mucus membranes or skin necrosis: no Has patient had a PCN reaction that required hospitalization: no Has patient had PCN reaction within the last 10 years: no  If all of the above answers are "NO", then may proceed with Cephalosporin use.  REACTION: unspecified  . Metronidazole Other (See Comments)    Pounding headaches patient says was bad.  With cipro for  diverticulitis rx. Other reaction(s): Other (See Comments) Pounding headaches patient says was bad.With cipro for diverticulitis rx.    Social History   Socioeconomic History  . Marital status: Widowed    Spouse name: Not on file  . Number of children: Not on file  . Years of education: Not on file  . Highest education level: Not on file  Occupational History  . Occupation: retired  Tobacco Use  . Smoking status: Never Smoker  . Smokeless tobacco: Never Used  Vaping Use  . Vaping Use: Never used  Substance and Sexual Activity  . Alcohol use: Yes  Alcohol/week: 0.0 standard drinks    Comment: occ  . Drug use: Never  . Sexual activity: Not Currently  Other Topics Concern  . Not on file  Social History Narrative   Retired   Regular exercise- yes   Widowed   At home with children and GKs    HH of 2     Puppy   Plays cards is social and active       Diet:  No      Do you drink/ eat things with caffeine? Yes      Marital status:   Widow                            What year were you married ? [id number]      Do you live in a house, apartment,assistred living, condo, trailer, etc.)? Townhouse      Is it one or more stories? 2      How many persons live in your home ? 2      Do you have any pets in your home ?(please list)  None      Highest Level of education completed:  12 th Grade      Current or past profession: Retail Sales @ [employer]      Do you exercise?    Some                          Type & how often  Walking      ADVANCED DIRECTIVES (Please bring copies)      Do you have a living will? Yes      Do you have a DNR form?  Yes                     If not, do you want to discuss one?       Do you have signed POA?HPOA forms? Yes                If so, please bring to your appointment      FUNCTIONAL STATUS- To be completed by Spouse / child / Staff       Do you have difficulty bathing or dressing yourself ? No      Do you have difficulty preparing food  or eating ?  No      Do you have difficulty managing your mediation ? No      Do you have difficulty managing your finances ? No      Do you have difficulty affording your medication ? No      Social Determinants of Radio broadcast assistant Strain: Not on file  Food Insecurity: Not on file  Transportation Needs: Not on file  Physical Activity: Not on file  Stress: Not on file  Social Connections: Not on file  Intimate Partner Violence: Not on file    Family History  Problem Relation Age of Onset  . Stroke Mother 61  . Liver disease Brother 53  . Heart attack Father 54  . Alzheimer's disease Sister 37  . Bladder Cancer Sister 59  . Crohn's disease Son   . Colon cancer Neg Hx   . Esophageal cancer Neg Hx   . Stomach cancer Neg Hx   . Rectal cancer Neg Hx   . Liver cancer Neg Hx     Review of Systems:  As stated in  the HPI and otherwise negative.   BP 118/64   Pulse 65   Ht 5' (1.524 m)   Wt 159 lb 6.4 oz (72.3 kg)   SpO2 98%   BMI 31.13 kg/m   Physical Examination:  General: Well developed, well nourished, NAD  HEENT: OP clear, mucus membranes moist  SKIN: warm, dry. No rashes. Neuro: No focal deficits  Musculoskeletal: Muscle strength 5/5 all ext  Psychiatric: Mood and affect normal  Neck: No JVD, no carotid bruits, no thyromegaly, no lymphadenopathy.  Lungs:Clear bilaterally, no wheezes, rhonci, crackles Cardiovascular: Irreg irreg. No murmurs, gallops or rubs. Abdomen:Soft. Bowel sounds present. Non-tender.  Extremities: No lower extremity edema. Pulses are 2 + in the bilateral DP/PT.  EKG:  EKG is not ordered today. The ekg ordered today demonstrates   Echo 06/18/19: 1. Left ventricular ejection fraction, by estimation, is 65 to 70%. The  left ventricle has normal function. The left ventricle has no regional  wall motion abnormalities. Left ventricular diastolic parameters are  indeterminate.  2. Right ventricular systolic function is normal.  The right ventricular  size is normal. There is normal pulmonary artery systolic pressure.  3. Left atrial size was moderately dilated.  4. Right atrial size was severely dilated.  5. The mitral valve is normal in structure. Trivial mitral valve  regurgitation. No evidence of mitral stenosis.  6. Tricuspid valve regurgitation is mild to moderate.  7. The aortic valve is normal in structure. Aortic valve regurgitation is  not visualized. No aortic stenosis is present.  8. The inferior vena cava is normal in size with greater than 50%  respiratory variability, suggesting right atrial pressure of 3 mmHg.   Recent Labs: 06/17/2019: Magnesium 1.6 08/02/2019: ALT 22; BUN 7; Creatinine, Ser 0.81; Potassium 4.2; Sodium 131 09/29/2019: Hemoglobin 13.6; Platelets 298   Lipid Panel    Component Value Date/Time   CHOL 202 (H) 04/07/2019 1552   TRIG 203.0 (H) 04/07/2019 1552   HDL 67.30 04/07/2019 1552   CHOLHDL 3 04/07/2019 1552   VLDL 40.6 (H) 04/07/2019 1552   LDLCALC 105 (H) 04/08/2018 1019   LDLDIRECT 100.0 04/07/2019 1552     Wt Readings from Last 3 Encounters:  05/18/20 159 lb 6.4 oz (72.3 kg)  05/15/20 163 lb 9.6 oz (74.2 kg)  04/26/20 162 lb 9.6 oz (73.8 kg)     Other studies Reviewed: Additional studies/ records that were reviewed today include: . Review of the above records demonstrates:    Assessment and Plan:   1. Paroxysmal atrial fibrillation: She is in atrial fib today on exam. She has no palpitations. Will continue Cardizem and Eliquis.       2. Mitral regurgitation: Trivial by echo in March 2021.    Current medicines are reviewed at length with the patient today.  The patient does not have concerns regarding medicines.  The following changes have been made:  no change  Labs/ tests ordered today include:  No orders of the defined types were placed in this encounter.    Disposition:   F/U with me in 12 months   Signed, Lauree Chandler,  MD 05/18/2020 1:39 PM    [facility] [address], [place], [place]  [address] Phone: [phone]; Fax: [phone]

## 2020-05-22 ENCOUNTER — Encounter: Payer: Self-pay | Admitting: Internal Medicine

## 2020-05-26 ENCOUNTER — Ambulatory Visit: Payer: Medicare Other | Admitting: Family

## 2020-06-30 ENCOUNTER — Other Ambulatory Visit: Payer: Self-pay

## 2020-06-30 ENCOUNTER — Other Ambulatory Visit: Payer: Medicare Other

## 2020-06-30 DIAGNOSIS — J0101 Acute recurrent maxillary sinusitis: Secondary | ICD-10-CM

## 2020-06-30 DIAGNOSIS — I4891 Unspecified atrial fibrillation: Secondary | ICD-10-CM

## 2020-06-30 DIAGNOSIS — I480 Paroxysmal atrial fibrillation: Secondary | ICD-10-CM

## 2020-06-30 DIAGNOSIS — K5902 Outlet dysfunction constipation: Secondary | ICD-10-CM

## 2020-06-30 DIAGNOSIS — K649 Unspecified hemorrhoids: Secondary | ICD-10-CM

## 2020-06-30 DIAGNOSIS — N1831 Chronic kidney disease, stage 3a: Secondary | ICD-10-CM

## 2020-06-30 DIAGNOSIS — E785 Hyperlipidemia, unspecified: Secondary | ICD-10-CM

## 2020-07-01 LAB — COMPLETE METABOLIC PANEL WITH GFR
AG Ratio: 1.8 (calc) (ref 1.0–2.5)
ALT: 14 U/L (ref 6–29)
AST: 21 U/L (ref 10–35)
Albumin: 4.3 g/dL (ref 3.6–5.1)
Alkaline phosphatase (APISO): 73 U/L (ref 37–153)
BUN: 9 mg/dL (ref 7–25)
CO2: 23 mmol/L (ref 20–32)
Calcium: 9 mg/dL (ref 8.6–10.4)
Chloride: 102 mmol/L (ref 98–110)
Creat: 0.74 mg/dL (ref 0.60–0.88)
GFR, Est African American: 85 mL/min/{1.73_m2} (ref >=60)
GFR, Est Non African American: 73 mL/min/{1.73_m2} (ref >=60)
Globulin: 2.4 g/dL (calc) (ref 1.9–3.7)
Glucose, Bld: 85 mg/dL (ref 65–99)
Potassium: 4.3 mmol/L (ref 3.5–5.3)
Sodium: 136 mmol/L (ref 135–146)
Total Bilirubin: 0.9 mg/dL (ref 0.2–1.2)
Total Protein: 6.7 g/dL (ref 6.1–8.1)

## 2020-07-01 LAB — CBC WITH DIFFERENTIAL/PLATELET
Absolute Monocytes: 444 cells/uL (ref 200–950)
Basophils Absolute: 62 cells/uL (ref 0–200)
Basophils Relative: 1.4 %
Eosinophils Absolute: 79 cells/uL (ref 15–500)
Eosinophils Relative: 1.8 %
HCT: 37.1 % (ref 35.0–45.0)
Hemoglobin: 12.4 g/dL (ref 11.7–15.5)
Lymphs Abs: 1294 cells/uL (ref 850–3900)
MCH: 31.8 pg (ref 27.0–33.0)
MCHC: 33.4 g/dL (ref 32.0–36.0)
MCV: 95.1 fL (ref 80.0–100.0)
MPV: 9.1 fL (ref 7.5–12.5)
Monocytes Relative: 10.1 %
Neutro Abs: 2521 cells/uL (ref 1500–7800)
Neutrophils Relative %: 57.3 %
Platelets: 240 10*3/uL (ref 140–400)
RBC: 3.9 10*6/uL (ref 3.80–5.10)
RDW: 12.7 % (ref 11.0–15.0)
Total Lymphocyte: 29.4 %
WBC: 4.4 10*3/uL (ref 3.8–10.8)

## 2020-07-01 LAB — LIPID PANEL
Cholesterol: 177 mg/dL (ref <200)
HDL: 61 mg/dL (ref >=50)
LDL Cholesterol (Calc): 93 mg/dL (calc)
Non-HDL Cholesterol (Calc): 116 mg/dL (calc) (ref <130)
Total CHOL/HDL Ratio: 2.9 (calc) (ref <5.0)
Triglycerides: 130 mg/dL (ref <150)

## 2020-07-03 ENCOUNTER — Other Ambulatory Visit: Payer: Medicare Other

## 2020-07-10 ENCOUNTER — Ambulatory Visit: Payer: Medicare Other | Admitting: Internal Medicine

## 2020-07-10 ENCOUNTER — Other Ambulatory Visit: Payer: Self-pay

## 2020-07-10 ENCOUNTER — Ambulatory Visit: Payer: Medicare Other | Admitting: Family

## 2020-07-10 ENCOUNTER — Encounter: Payer: Self-pay | Admitting: Family

## 2020-07-10 VITALS — BP 142/88 | HR 102 | Temp 97.5°F | Resp 16 | Ht 60.0 in | Wt 162.2 lb

## 2020-07-10 DIAGNOSIS — F419 Anxiety disorder, unspecified: Secondary | ICD-10-CM

## 2020-07-10 DIAGNOSIS — R350 Frequency of micturition: Secondary | ICD-10-CM | POA: Diagnosis not present

## 2020-07-10 DIAGNOSIS — J0101 Acute recurrent maxillary sinusitis: Secondary | ICD-10-CM

## 2020-07-10 DIAGNOSIS — K5901 Slow transit constipation: Secondary | ICD-10-CM

## 2020-07-10 DIAGNOSIS — E785 Hyperlipidemia, unspecified: Secondary | ICD-10-CM

## 2020-07-10 DIAGNOSIS — N1831 Chronic kidney disease, stage 3a: Secondary | ICD-10-CM

## 2020-07-10 DIAGNOSIS — I480 Paroxysmal atrial fibrillation: Secondary | ICD-10-CM

## 2020-07-10 LAB — POCT URINALYSIS DIPSTICK
Glucose, UA: NEGATIVE
Nitrite, UA: NEGATIVE
Protein, UA: POSITIVE — AB
Spec Grav, UA: 1.01 (ref 1.010–1.025)
Urobilinogen, UA: NEGATIVE E.U./dL — AB
pH, UA: 8.5 — AB (ref 5.0–8.0)

## 2020-07-10 MED ORDER — AZITHROMYCIN 250 MG PO TABS
ORAL_TABLET | ORAL | 0 refills | Status: AC
Start: 2020-07-10 — End: 2020-07-15

## 2020-07-10 NOTE — Progress Notes (Signed)
Provider: Marlowe Sax FNP-C   Juanna Pudlo, [name], NP  Patient Care Team: Avraj Lindroth, [name], NP as PCP - General (Family Medicine) Burnell Blanks, MD as PCP - Cardiology (Cardiology) Bjorn Loser, MD (Urology) Particia Nearing, MD (Dermatology) Luberta Mutter, MD (Ophthalmology) Almedia Balls, MD (Orthopedic Surgery) Ouida Sills  (Rheumatology) Jarome Matin, MD as Consulting Physician (Dermatology) Gatha Mayer, MD as Consulting Physician (Gastroenterology) Rozetta Nunnery, MD as Consulting Physician (Otolaryngology)  Extended Emergency Contact Information Primary Emergency Contact: Apple,Karen Address: [address]          [place] [address] [name] Phone: [phone] Work Phone: [phone] Mobile Phone: [phone] Relation: Daughter  Code Status:  Full code  Goals of care: Advanced Directive information Advanced Directives 07/10/2020  Does Patient Have a Medical Advance Directive? Yes  Type of Advance Directive [place]  Does patient want to make changes to medical advance directive? No - Patient declined  Copy of [place] in Chart? Yes - validated most recent copy scanned in chart (See row information)  Would patient like information on creating a medical advance directive? -  Pre-existing out of facility DNR order (yellow form or pink MOST form) -     Chief Complaint  Patient presents with  . Medical Management of Chronic Issues    4 month follow up.    HPI:  Pt is a 87 y.o. female seen today for medical management of chronic diseases.she is here with her daughter.she was previously following up here with PCP  Dr.Tiffany Reed who no longer here at the Practice.Has medical history of Hyperlipidemia,Afib,Generalized anxiety disorder,CKD stage 3 ,constipation ,Osteoporosis,GERD,IBS,DDD,Allergic rhinitis among other conditions.  She complains of increased sinus yellow drainage  .went out on Saturday when wind was blowing came back with drainage.Has pain and pressure over right maxillary.she does have a history of recurrent sinus infection.Has flushed sinus without any relief.right side of nostril is congested.  Continue to have issues with constipation.Has bowel movement every other day.drinks prune juice,grape juice with fiber and miralax.daughter thinks patient's bowels have been moving regular now used to be obsessed with constipation.     Past Medical History:  Diagnosis Date  . Abdominal pain 12/2009   hospitalized  . Allergic rhinitis   . Anxiety   . Bladder problem   . Chronic constipation   . Compression fx, lumbar spine ([place])   . DDD (degenerative disc disease), lumbar   . Diverticulosis of colon   . Dog bite(E906.0) 08/30/2011  . Dysrhythmia   . GERD (gastroesophageal reflux disease)   . Hepatic steatosis    pt denies  . Hyperlipidemia   . Hyperplastic colon polyp   . IBS (irritable bowel syndrome)    constipation predominant  . Internal hemorrhoids   . Osteoporosis    dexa 2011 -2.7 hip nl spine  . PAF (paroxysmal atrial fibrillation) ([place])    a. dx 05/2017.  Marland Kitchen PMR (polymyalgia rheumatica) (HCC) 08/30/2011   under rheum care  on low dose pred 5 mg   . Renal disorder    Past Surgical History:  Procedure Laterality Date  . ABDOMINAL HYSTERECTOMY     with bladder tact and rectocele repair  . BLADDER SURGERY     Streched  . CATARACT EXTRACTION  2010   both  . COLONOSCOPY  2016 was most recent  . CYSTO WITH HYDRODISTENSION N/A 10/05/2019   Procedure: CYSTOSCOPY/HYDRODISTENSION;  Surgeon: Bjorn Loser, MD;  Location: [facility];  Service: Urology;  Laterality: N/A;  . TONSILLECTOMY AND ADENOIDECTOMY      Allergies  Allergen Reactions  . Alendronate Sodium     upset stomach  . Penicillins     Has patient had a PCN reaction causing immediate rash, facial/tongue/throat swelling, SOB or lightheadedness with hypotension: Yes Has patient had  a PCN reaction causing severe rash involving mucus membranes or skin necrosis: No Has patient had a PCN reaction that required hospitalization: No Has patient had a PCN reaction occurring within the last 10 years: No If all of the above answers are "NO", then may proceed with Cephalosporin use.   . Amoxicillin Rash    Has patient had a PCN reaction causing immediate rash, facial/tongue/throat swelling, SOB or lightheadedness with hypotension: yes Has patient had a PCN reaction causing severe rash involving mucus membranes or skin necrosis: no Has patient had a PCN reaction that required hospitalization: no Has patient had PCN reaction within the last 10 years: no  If all of the above answers are "NO", then may proceed with Cephalosporin use.  REACTION: unspecified  . Metronidazole Other (See Comments)    Pounding headaches patient says was bad.  With cipro for diverticulitis rx. Other reaction(s): Other (See Comments) Pounding headaches patient says was bad.With cipro for diverticulitis rx.    Allergies as of 07/10/2020      Reactions   Alendronate Sodium    upset stomach   Penicillins    Has patient had a PCN reaction causing immediate rash, facial/tongue/throat swelling, SOB or lightheadedness with hypotension: Yes Has patient had a PCN reaction causing severe rash involving mucus membranes or skin necrosis: No Has patient had a PCN reaction that required hospitalization: No Has patient had a PCN reaction occurring within the last 10 years: No If all of the above answers are "NO", then may proceed with Cephalosporin use.   Amoxicillin Rash   Has patient had a PCN reaction causing immediate rash, facial/tongue/throat swelling, SOB or lightheadedness with hypotension: yes Has patient had a PCN reaction causing severe rash involving mucus membranes or skin necrosis: no Has patient had a PCN reaction that required hospitalization: no Has patient had PCN reaction within the last 10  years: no  If all of the above answers are "NO", then may proceed with Cephalosporin use. REACTION: unspecified   Metronidazole Other (See Comments)   Pounding headaches patient says was bad.  With cipro for diverticulitis rx. Other reaction(s): Other (See Comments) Pounding headaches patient says was bad.With cipro for diverticulitis rx.      Medication List       Accurate as of July 10, 2020  4:09 PM. If you have any questions, ask your nurse or doctor.        STOP taking these medications   predniSONE 10 MG tablet Commonly known as: DELTASONE Stopped by:  C , NP     TAKE these medications   acetaminophen 500 MG tablet Commonly known as: TYLENOL Take 500 mg by mouth. Three a week   Align 4 MG Caps Take 1 capsule by mouth daily.   calcium carbonate 1250 (500 Ca) MG chewable tablet Commonly known as: OS-CAL Chew 1 tablet by mouth daily.   diltiazem 120 MG 24 hr capsule Commonly known as: CARDIZEM CD TAKE 1 CAPSULE BY MOUTH EVERY DAY   Eliquis 5 MG Tabs tablet Generic drug: apixaban TAKE 1 TABLET BY MOUTH TWICE A DAY   escitalopram 5 MG tablet Commonly known as: LEXAPRO TAKE 1 TABLET BY MOUTH   EVERYDAY AT BEDTIME   guaiFENesin 600 MG 12 hr tablet Commonly known as: MUCINEX Take 600 mg by mouth daily.   hydrocortisone 2.5 % rectal cream Commonly known as: ANUSOL-HC Place 1 application rectally 3 (three) times daily.   hydrocortisone 25 MG suppository Commonly known as: ANUSOL-HC Place 1 suppository (25 mg total) rectally at bedtime.   loratadine 10 MG tablet Commonly known as: CLARITIN Take 10 mg by mouth every other day.   magnesium hydroxide 400 MG/5ML suspension Commonly known as: MILK OF MAGNESIA Take 15 mLs by mouth daily as needed for mild constipation.   multivitamin tablet Take 1 tablet by mouth daily.   polyethylene glycol powder 17 GM/SCOOP powder Commonly known as: GLYCOLAX/MIRALAX Take 17 g by mouth daily. Hold for loose  stool   Premarin vaginal cream Generic drug: conjugated estrogens INSERT 1 GRAM VAGINALLY THREE TIMES A WEEKLY, THEN ONCE WEEKLY   Restasis 0.05 % ophthalmic emulsion Generic drug: cycloSPORINE Place 1 drop into both eyes 2 (two) times daily.   triamcinolone 55 MCG/ACT Aero nasal inhaler Commonly known as: NASACORT 2 SPRAYS INTO EACH NOSTRIL AT NIGHT   vitamin C 100 MG tablet Take 500 mg by mouth daily.       Review of Systems  Constitutional: Negative for appetite change, chills, fatigue and fever.  HENT: Positive for postnasal drip, sinus pressure and sinus pain. Negative for congestion, sneezing, sore throat and trouble swallowing.   Respiratory: Negative for cough, chest tightness, shortness of breath and wheezing.   Cardiovascular: Negative for chest pain, palpitations and leg swelling.  Gastrointestinal: Positive for constipation. Negative for abdominal distention, abdominal pain, diarrhea, nausea and vomiting.    Immunization History  Administered Date(s) Administered  . Fluad Quad(high Dose 65+) 01/10/2020  . Influenza Split 01/25/2011, 01/23/2012  . Influenza Whole 12/31/2007  . Influenza, High Dose Seasonal PF 01/19/2013, 01/10/2014, 01/17/2015, 01/18/2016, 01/02/2017, 01/05/2018, 01/05/2019, 01/05/2019  . Influenza-Unspecified 01/02/2017  . PFIZER(Purple Top)SARS-COV-2 Vaccination 05/07/2019, 06/02/2019, 01/31/2020  . Pneumococcal Conjugate-13 02/23/2013  . Pneumococcal Polysaccharide-23 04/01/2006, 04/01/2018  . Td 04/01/2005  . Tdap 08/30/2011  . Zoster 04/02/2007  . Zoster Recombinat (Shingrix) 12/04/2016, 07/27/2017   Pertinent  Health Maintenance Due  Topic Date Due  . INFLUENZA VACCINE  10/30/2020  . MAMMOGRAM  06/29/2021  . DEXA SCAN  Completed  . PNA vac Low Risk Adult  Completed   Fall Risk  07/10/2020 05/15/2020 04/26/2020 03/06/2020 01/10/2020  Falls in the past year? 0 0 0 0 0  Number falls in past yr: 0 0 0 0 0  Injury with Fall? 0 0 0 0 0   Comment - - - - -   Functional Status Survey:    Vitals:   07/10/20 1540  BP: (!) 142/88  Pulse: (!) 102  Resp: 16  Temp: (!) 97.5 F (36.4 C)  SpO2: 95%  Weight: 162 lb 3.2 oz (73.6 kg)  Height: 5' (1.524 m)   Body mass index is 31.68 kg/m. Physical Exam Vitals reviewed.  Constitutional:      General: She is not in acute distress.    Appearance: She is obese. She is not ill-appearing.  HENT:     Head: Normocephalic.     Right Ear: Tympanic membrane, ear canal and external ear normal. There is no impacted cerumen.     Left Ear: Tympanic membrane, ear canal and external ear normal. There is no impacted cerumen.     Nose: Congestion present. No rhinorrhea.     Right Sinus: Maxillary   sinus tenderness present. No frontal sinus tenderness.     Left Sinus: No maxillary sinus tenderness or frontal sinus tenderness.     Mouth/Throat:     Mouth: Mucous membranes are moist.     Pharynx: Oropharynx is clear. No oropharyngeal exudate or posterior oropharyngeal erythema.  Eyes:     General: No scleral icterus.       Right eye: No discharge.        Left eye: No discharge.     Extraocular Movements: Extraocular movements intact.     Conjunctiva/sclera: Conjunctivae normal.     Pupils: Pupils are equal, round, and reactive to light.  Neck:     Vascular: No carotid bruit.  Cardiovascular:     Rate and Rhythm: Normal rate and regular rhythm.     Pulses: Normal pulses.     Heart sounds: Normal heart sounds.  Pulmonary:     Effort: Pulmonary effort is normal. No respiratory distress.     Breath sounds: Normal breath sounds. No wheezing, rhonchi or rales.  Chest:     Chest wall: No tenderness.  Abdominal:     General: Bowel sounds are normal. There is no distension.     Palpations: Abdomen is soft. There is no mass.     Tenderness: There is no abdominal tenderness. There is no right CVA tenderness, left CVA tenderness, guarding or rebound.  Musculoskeletal:        General: No  swelling or tenderness.     Cervical back: Normal range of motion. No rigidity or tenderness.     Right lower leg: No edema.     Left lower leg: No edema.  Lymphadenopathy:     Cervical: No cervical adenopathy.  Skin:    General: Skin is warm and dry.     Coloration: Skin is not jaundiced or pale.     Findings: No bruising, erythema or rash.  Neurological:     Mental Status: She is alert and oriented to person, place, and time.     Cranial Nerves: No cranial nerve deficit.     Sensory: No sensory deficit.     Motor: No weakness.     Coordination: Coordination normal.     Gait: Gait normal.  Psychiatric:        Mood and Affect: Mood normal.        Speech: Speech normal.        Behavior: Behavior normal.        Thought Content: Thought content normal.        Judgment: Judgment normal.     Labs reviewed: Recent Labs    08/02/19 2035 06/30/20 0000  NA 131* 136  K 4.2 4.3  CL 98 102  CO2 23 23  GLUCOSE 96 85  BUN 7* 9  CREATININE 0.81 0.74  CALCIUM 9.4 9.0   Recent Labs    08/02/19 2035 06/30/20 0000  AST 28 21  ALT 22 14  ALKPHOS 61  --   BILITOT 0.5 0.9  PROT 6.8 6.7  ALBUMIN 4.0  --    Recent Labs    08/02/19 2035 09/29/19 1454 06/30/20 0000  WBC 7.0 9.1 4.4  NEUTROABS  --   --  2,521  HGB 13.5 13.6 12.4  HCT 39.3 41.3 37.1  MCV 94.9 97.2 95.1  PLT 286 298 240   Lab Results  Component Value Date   TSH 2.39 04/07/2019   No results found for: HGBA1C Lab Results  Component Value Date  CHOL 177 06/30/2020   HDL 61 06/30/2020   LDLCALC 93 06/30/2020   LDLDIRECT 100.0 04/07/2019   TRIG 130 06/30/2020   CHOLHDL 2.9 06/30/2020    Significant Diagnostic Results in last 30 days:  No results found.  Assessment/Plan  1. Urinary frequency Afebrile - POC Urinalysis Dipstick indicates yellow clear urine with large bilirubin,small ketones,large blood,positive for protein and small leukocytes.  - Urine Culture  2. Hyperlipidemia, unspecified  hyperlipidemia type - continue on dietary modification and exercise  - Lipid panel; Future  3. Anxiety Symptoms stable  Continue on low dose Lexapro.  - TSH; Future  4. Acute recurrent maxillary sinusitis Afebrile . Very tender to palpation over right maxillary area.Yellow nasal drainage noted.  Start on Z-pak as below has ben effective in the past per daughter.  - azithromycin (ZITHROMAX) 250 MG tablet; Take 2 tablets (500 mg total) by mouth daily for 1 day, THEN 1 tablet (250 mg total) daily for 4 days.  Dispense: 6 tablet; Refill: 0 - CBC with Differential/Platelet; Future  5. AF (paroxysmal atrial fibrillation) (HCC) HR controlled. - continue on EliQuis no signs of bleeding.  - CBC with Differential/Platelet; Future  6. Stage 3a chronic kidney disease (HCC) Will continue to avoid Nephrotoxins and dose all other medication for renal clearance   - CMP with eGFR([facility]); Future  7. Constipation, slow transit - encouraged to increase fiber in diet  - increase water intake to 6-8 glasses of water daily and exercise as tolerated  - continue on miralax   Family/ staff Communication: Reviewed plan of care with patient and daughter verbalized understanding  Labs/tests ordered:   - Urine Culture - CBC with Differential/Platelet - CMP with eGFR([facility]) - TSH - Lipid panel  Next Appointment : 6 months for medical management of chronic issues.Fasting Labs prior to visit.     C , NP   

## 2020-07-10 NOTE — Patient Instructions (Addendum)
Sinusitis, Adult Sinusitis is soreness and swelling (inflammation) of your sinuses. Sinuses are hollow spaces in the bones around your face. They are located:  Around your eyes.  In the middle of your forehead.  Behind your nose.  In your cheekbones. Your sinuses and nasal passages are lined with a fluid called mucus. Mucus drains out of your sinuses. Swelling can trap mucus in your sinuses. This lets germs (bacteria, virus, or fungus) grow, which leads to infection. Most of the time, this condition is caused by a virus. What are the causes? This condition is caused by:  Allergies.  Asthma.  Germs.  Things that block your nose or sinuses.  Growths in the nose (nasal polyps).  Chemicals or irritants in the air.  Fungus (rare). What increases the risk? You are more likely to develop this condition if:  You have a weak body defense system (immune system).  You do a lot of swimming or diving.  You use nasal sprays too much.  You smoke. What are the signs or symptoms? The main symptoms of this condition are pain and a feeling of pressure around the sinuses. Other symptoms include:  Stuffy nose (congestion).  Runny nose (drainage).  Swelling and warmth in the sinuses.  Headache.  Toothache.  A cough that may get worse at night.  Mucus that collects in the throat or the back of the nose (postnasal drip).  Being unable to smell and taste.  Being very tired (fatigue).  A fever.  Sore throat.  Bad breath. How is this diagnosed? This condition is diagnosed based on:  Your symptoms.  Your medical history.  A physical exam.  Tests to find out if your condition is short-term (acute) or long-term (chronic). Your doctor may: ? Check your nose for growths (polyps). ? Check your sinuses using a tool that has a light (endoscope). ? Check for allergies or germs. ? Do imaging tests, such as an MRI or CT scan. How is this treated? Treatment for this condition  depends on the cause and whether it is short-term or long-term.  If caused by a virus, your symptoms should go away on their own within 10 days. You may be given medicines to relieve symptoms. They include: ? Medicines that shrink swollen tissue in the nose. ? Medicines that treat allergies (antihistamines). ? A spray that treats swelling of the nostrils. ? Rinses that help get rid of thick mucus in your nose (nasal saline washes).  If caused by bacteria, your doctor may wait to see if you will get better without treatment. You may be given antibiotic medicine if you have: ? A very bad infection. ? A weak body defense system.  If caused by growths in the nose, you may need to have surgery. Follow these instructions at home: Medicines  Take, use, or apply over-the-counter and prescription medicines only as told by your doctor. These may include nasal sprays.  If you were prescribed an antibiotic medicine, take it as told by your doctor. Do not stop taking the antibiotic even if you start to feel better. Hydrate and humidify  Drink enough water to keep your pee (urine) pale yellow.  Use a cool mist humidifier to keep the humidity level in your home above 50%.  Breathe in steam for 10-15 minutes, 3-4 times a day, or as told by your doctor. You can do this in the bathroom while a hot shower is running.  Try not to spend time in cool or dry air.  Rest  Rest as much as you can.  Sleep with your head raised (elevated).  Make sure you get enough sleep each night. General instructions  Put a warm, moist washcloth on your face 3-4 times a day, or as often as told by your doctor. This will help with discomfort.  Wash your hands often with soap and water. If there is no soap and water, use hand sanitizer.  Do not smoke. Avoid being around people who are smoking (secondhand smoke).  Keep all follow-up visits as told by your doctor. This is important.   Contact a doctor if:  You  have a fever.  Your symptoms get worse.  Your symptoms do not get better within 10 days. Get help right away if:  You have a very bad headache.  You cannot stop throwing up (vomiting).  You have very bad pain or swelling around your face or eyes.  You have trouble seeing.  You feel confused.  Your neck is stiff.  You have trouble breathing. Summary  Sinusitis is swelling of your sinuses. Sinuses are hollow spaces in the bones around your face.  This condition is caused by tissues in your nose that become inflamed or swollen. This traps germs. These can lead to infection.  If you were prescribed an antibiotic medicine, take it as told by your doctor. Do not stop taking it even if you start to feel better.  Keep all follow-up visits as told by your doctor. This is important. This information is not intended to replace advice given to you by your health care provider. Make sure you discuss any questions you have with your health care provider. Document Revised: 08/18/2017 Document Reviewed: 08/18/2017 Elsevier Patient Education  2021 [place].   Constipation, Adult Constipation is when a person has trouble pooping (having a bowel movement). When you have this condition, you may poop fewer than 3 times a week. Your poop (stool) may also be dry, hard, or bigger than normal. Follow these instructions at home: Eating and drinking  Eat foods that have a lot of fiber, such as: ? Fresh fruits and vegetables. ? Whole grains. ? Beans.  Eat less of foods that are low in fiber and high in fat and sugar, such as: ? [place] fries. ? Hamburgers. ? Cookies. ? Candy. ? Soda.  Drink enough fluid to keep your pee (urine) pale yellow.   General instructions  Exercise regularly or as told by your doctor. Try to do 150 minutes of exercise each week.  Go to the restroom when you feel like you need to poop. Do not hold it in.  Take over-the-counter and prescription medicines only  as told by your doctor. These include any fiber supplements.  When you poop: ? Do deep breathing while relaxing your lower belly (abdomen). ? Relax your pelvic floor. The pelvic floor is a group of muscles that support the rectum, bladder, and intestines (as well as the uterus in women).  Watch your condition for any changes. Tell your doctor if you notice any.  Keep all follow-up visits as told by your doctor. This is important. Contact a doctor if:  You have pain that gets worse.  You have a fever.  You have not pooped for 4 days.  You vomit.  You are not hungry.  You lose weight.  You are bleeding from the opening of the butt (anus).  You have thin, pencil-like poop. Get help right away if:  You have a fever, and your  symptoms suddenly get worse.  You leak poop or have blood in your poop.  Your belly feels hard or bigger than normal (bloated).  You have very bad belly pain.  You feel dizzy or you faint. Summary  Constipation is when a person poops fewer than 3 times a week, has trouble pooping, or has poop that is dry, hard, or bigger than normal.  Eat foods that have a lot of fiber.  Drink enough fluid to keep your pee (urine) pale yellow.  Take over-the-counter and prescription medicines only as told by your doctor. These include any fiber supplements. This information is not intended to replace advice given to you by your health care provider. Make sure you discuss any questions you have with your health care provider. Document Revised: 02/03/2019 Document Reviewed: 02/03/2019 Elsevier Patient Education  [place].

## 2020-07-11 LAB — URINE CULTURE
MICRO NUMBER:: 11756214
Result:: NO GROWTH
SPECIMEN QUALITY:: ADEQUATE

## 2020-07-19 ENCOUNTER — Other Ambulatory Visit: Payer: Self-pay | Admitting: Family

## 2020-07-19 DIAGNOSIS — Z1231 Encounter for screening mammogram for malignant neoplasm of breast: Secondary | ICD-10-CM

## 2020-09-07 ENCOUNTER — Other Ambulatory Visit: Payer: Self-pay | Admitting: Cardiovascular Disease

## 2020-09-07 ENCOUNTER — Other Ambulatory Visit: Payer: Self-pay | Admitting: *Deleted

## 2020-09-07 DIAGNOSIS — F419 Anxiety disorder, unspecified: Secondary | ICD-10-CM

## 2020-09-07 MED ORDER — ESCITALOPRAM OXALATE 5 MG PO TABS: ORAL_TABLET | ORAL | 1 refills | Status: DC

## 2020-09-07 NOTE — Telephone Encounter (Signed)
[facility] requested

## 2020-09-08 ENCOUNTER — Other Ambulatory Visit: Payer: Self-pay

## 2020-09-08 ENCOUNTER — Ambulatory Visit
Admission: RE | Admit: 2020-09-08 | Discharge: 2020-09-08 | Disposition: A | Discharge status: Home / Self Care | Payer: Medicare Other | Source: Ambulatory Visit | Attending: Family | Admitting: Family

## 2020-09-08 DIAGNOSIS — Z1231 Encounter for screening mammogram for malignant neoplasm of breast: Secondary | ICD-10-CM

## 2020-10-11 ENCOUNTER — Encounter: Payer: Self-pay | Admitting: Family

## 2020-10-11 ENCOUNTER — Other Ambulatory Visit: Payer: Self-pay

## 2020-10-11 ENCOUNTER — Ambulatory Visit: Payer: Medicare Other | Admitting: Family

## 2020-10-11 VITALS — BP 120/70 | HR 76 | Temp 97.3°F | Resp 16 | Ht 60.0 in | Wt 163.4 lb

## 2020-10-11 DIAGNOSIS — M79641 Pain in right hand: Secondary | ICD-10-CM

## 2020-10-11 DIAGNOSIS — M65341 Trigger finger, right ring finger: Secondary | ICD-10-CM | POA: Diagnosis not present

## 2020-10-11 DIAGNOSIS — M25441 Effusion, right hand: Secondary | ICD-10-CM

## 2020-10-11 MED ORDER — ACETAMINOPHEN 500 MG PO TABS: 1000.0000 mg | ORAL_TABLET | Freq: Two times a day (BID) | ORAL | 5 refills | Status: DC

## 2020-10-11 NOTE — Progress Notes (Signed)
Provider: Marlowe Sax FNP-C  Osceola Depaz, [name], NP  Patient Care Team: Cesareo Vickrey, [name], NP as PCP - General (Family Medicine) Burnell Blanks, MD as PCP - Cardiology (Cardiology) Bjorn Loser, MD (Urology) Particia Nearing, MD (Dermatology) Luberta Mutter, MD (Ophthalmology) Almedia Balls, MD (Orthopedic Surgery) Ouida Sills  (Rheumatology) Jarome Matin, MD as Consulting Physician (Dermatology) Gatha Mayer, MD as Consulting Physician (Gastroenterology) Rozetta Nunnery, MD as Consulting Physician (Otolaryngology)  Extended Emergency Contact Information Primary Emergency Contact: Apple,Karen Address: [address]          [place] [address] [name] of [place] Phone: [phone] Work Phone: [phone] Mobile Phone: [phone] Relation: Daughter  Code Status:  Full Code  Goals of care: Advanced Directive information Advanced Directives 10/11/2020  Does Patient Have a Medical Advance Directive? Yes  Type of Advance Directive [place]  Does patient want to make changes to medical advance directive? No - Patient declined  Copy of [place] in Chart? Yes - validated most recent copy scanned in chart (See row information)  Would patient like information on creating a medical advance directive? -  Pre-existing out of facility DNR order (yellow form or pink MOST form) -     Chief Complaint  Patient presents with   Acute Visit    Complains of arthritis in right arm.    HPI:  Pt is a 87 y.o. female seen today for an acute visit for evaluation of arthritis in right arm.State pain on right arm has worsen with swollen joints.States not stiff in the morning but unable to open cans anymore.Has used OTC ice hot and other creams.takes regular tylenol twice daily. She denies any redness.pain runs also to the shoulder. Denies any numbness or tingling on the hands.   Also complains of right ring finger  inability to strengthen after she bends fingers. No pain.     Past Medical History:  Diagnosis Date   Abdominal pain 12/2009   hospitalized   Allergic rhinitis    Anxiety    Bladder problem    Chronic constipation    Compression fx, lumbar spine (HCC)    DDD (degenerative disc disease), lumbar    Diverticulosis of colon    Dog bite(E906.0) 08/30/2011   Dysrhythmia    GERD (gastroesophageal reflux disease)    Hepatic steatosis    pt denies   Hyperlipidemia    Hyperplastic colon polyp    IBS (irritable bowel syndrome)    constipation predominant   Internal hemorrhoids    Osteoporosis    dexa 2011 -2.7 hip nl spine   PAF (paroxysmal atrial fibrillation) ([place])    a. dx 05/2017.   PMR (polymyalgia rheumatica) ([place]) 08/30/2011   under rheum care  on low dose pred 5 mg    Renal disorder    Past Surgical History:  Procedure Laterality Date   ABDOMINAL HYSTERECTOMY     with bladder tact and rectocele repair   BLADDER SURGERY     Streched   CATARACT EXTRACTION  2010   both   COLONOSCOPY  2016 was most recent   CYSTO WITH HYDRODISTENSION N/A 10/05/2019   Procedure: CYSTOSCOPY/HYDRODISTENSION;  Surgeon: Bjorn Loser, MD;  Location: [facility];  Service: Urology;  Laterality: N/A;   TONSILLECTOMY AND ADENOIDECTOMY      Allergies  Allergen Reactions   Alendronate Sodium     upset stomach   Penicillins     Has patient had a PCN reaction causing immediate rash, facial/tongue/throat  swelling, SOB or lightheadedness with hypotension: Yes Has patient had a PCN reaction causing severe rash involving mucus membranes or skin necrosis: No Has patient had a PCN reaction that required hospitalization: No Has patient had a PCN reaction occurring within the last 10 years: No If all of the above answers are "NO", then may proceed with Cephalosporin use.    Amoxicillin Rash    Has patient had a PCN reaction causing immediate rash, facial/tongue/throat swelling, SOB or lightheadedness with  hypotension: yes Has patient had a PCN reaction causing severe rash involving mucus membranes or skin necrosis: no Has patient had a PCN reaction that required hospitalization: no Has patient had PCN reaction within the last 10 years: no  If all of the above answers are "NO", then may proceed with Cephalosporin use.  REACTION: unspecified   Metronidazole Other (See Comments)    Pounding headaches patient says was bad.  With cipro for diverticulitis rx. Other reaction(s): Other (See Comments) Pounding headaches patient says was bad.  With cipro for diverticulitis rx.    Outpatient Encounter Medications as of 10/11/2020  Medication Sig   acetaminophen (TYLENOL) 500 MG tablet Take 500 mg by mouth. Three a week   Ascorbic Acid (VITAMIN C) 100 MG tablet Take 500 mg by mouth daily.   calcium carbonate (OS-CAL) 1250 (500 Ca) MG chewable tablet Chew 1 tablet by mouth daily.    diltiazem (CARDIZEM CD) 120 MG 24 hr capsule TAKE 1 CAPSULE BY MOUTH EVERY DAY   ELIQUIS 5 MG TABS tablet TAKE 1 TABLET BY MOUTH TWICE A DAY   escitalopram (LEXAPRO) 5 MG tablet Take one tablet by mouth once daily at bedtime.   guaiFENesin (MUCINEX) 600 MG 12 hr tablet Take 600 mg by mouth daily.   loratadine (CLARITIN) 10 MG tablet Take 10 mg by mouth every other day.   magnesium hydroxide (MILK OF MAGNESIA) 400 MG/5ML suspension Take 15 mLs by mouth daily as needed for mild constipation.   Multiple Vitamin (MULTIVITAMIN) tablet Take 1 tablet by mouth daily.   polyethylene glycol powder (GLYCOLAX/MIRALAX) 17 GM/SCOOP powder Take 17 g by mouth daily. Hold for loose stool   PREMARIN vaginal cream INSERT 1 GRAM VAGINALLY THREE TIMES A WEEKLY, THEN ONCE WEEKLY   Probiotic Product (ALIGN) 4 MG CAPS Take 1 capsule by mouth daily.   RESTASIS 0.05 % ophthalmic emulsion Place 1 drop into both eyes 2 (two) times daily.   triamcinolone (NASACORT) 55 MCG/ACT AERO nasal inhaler 2 SPRAYS INTO EACH NOSTRIL AT NIGHT   [DISCONTINUED]  hydrocortisone (ANUSOL-HC) 2.5 % rectal cream Place 1 application rectally 3 (three) times daily.   [DISCONTINUED] hydrocortisone (ANUSOL-HC) 25 MG suppository Place 1 suppository (25 mg total) rectally at bedtime.   No facility-administered encounter medications on file as of 10/11/2020.    Review of Systems  Constitutional:  Negative for appetite change, chills, fatigue, fever and unexpected weight change.  Respiratory:  Negative for cough, chest tightness, shortness of breath and wheezing.   Cardiovascular:  Negative for chest pain, palpitations and leg swelling.  Musculoskeletal:  Positive for arthralgias and joint swelling. Negative for back pain, myalgias, neck pain and neck stiffness.  Skin:  Negative for color change, pallor, rash and wound.  Neurological:  Negative for dizziness, weakness, light-headedness, numbness and headaches.  Hematological:  Does not bruise/bleed easily.  Psychiatric/Behavioral:  Negative for agitation, behavioral problems, confusion and sleep disturbance. The patient is not nervous/anxious.    Immunization History  Administered Date(s) Administered   [employer]  Quad(high Dose 65+) 01/10/2020   Influenza Split 01/25/2011, 01/23/2012   Influenza Whole 12/31/2007   Influenza, High Dose Seasonal PF 01/19/2013, 01/10/2014, 01/17/2015, 01/18/2016, 01/02/2017, 01/05/2018, 01/05/2019, 01/05/2019   Influenza-Unspecified 01/02/2017   PFIZER(Purple Top)SARS-COV-2 Vaccination 05/07/2019, 06/02/2019, 01/31/2020   Pneumococcal Conjugate-13 02/23/2013   Pneumococcal Polysaccharide-23 04/01/2006, 04/01/2018   Td 04/01/2005   Tdap 08/30/2011   Zoster Recombinat (Shingrix) 12/04/2016, 07/27/2017   Zoster, Live 04/02/2007   Pertinent  Health Maintenance Due  Topic Date Due   INFLUENZA VACCINE  10/30/2020   MAMMOGRAM  09/09/2022   DEXA SCAN  Completed   PNA vac Low Risk Adult  Completed   Fall Risk  10/11/2020 07/10/2020 05/15/2020 04/26/2020 03/06/2020  Falls in the past  year? 0 0 0 0 0  Number falls in past yr: 0 0 0 0 0  Injury with Fall? 0 0 0 0 0  Comment - - - - -  Risk for fall due to : No Fall Risks - - - -  Follow up Falls evaluation completed - - - -   Functional Status Survey:    Vitals:   10/11/20 1459  BP: 120/70  Pulse: 76  Resp: 16  Temp: (!) 97.3 F (36.3 C)  SpO2: 95%  Weight: 163 lb 6.4 oz (74.1 kg)  Height: 5' (1.524 m)   Body mass index is 31.91 kg/m. Physical Exam Vitals reviewed.  Constitutional:      General: She is not in acute distress.    Appearance: Normal appearance. She is obese. She is not ill-appearing or diaphoretic.  HENT:     Head: Normocephalic.     Nose: Nose normal. No congestion or rhinorrhea.     Mouth/Throat:     Mouth: Mucous membranes are moist.     Pharynx: Oropharynx is clear. No oropharyngeal exudate or posterior oropharyngeal erythema.  Eyes:     General: No scleral icterus.       Right eye: No discharge.        Left eye: No discharge.     Extraocular Movements: Extraocular movements intact.     Conjunctiva/sclera: Conjunctivae normal.     Pupils: Pupils are equal, round, and reactive to light.  Neck:     Vascular: No carotid bruit.  Cardiovascular:     Rate and Rhythm: Normal rate and regular rhythm.     Pulses: Normal pulses.     Heart sounds: Normal heart sounds. No murmur heard.   No friction rub. No gallop.  Pulmonary:     Effort: Pulmonary effort is normal. No respiratory distress.     Breath sounds: Normal breath sounds. No wheezing, rhonchi or rales.  Chest:     Chest wall: No tenderness.  Abdominal:     General: Bowel sounds are normal. There is no distension.     Palpations: Abdomen is soft. There is no mass.     Tenderness: There is no abdominal tenderness. There is no right CVA tenderness, left CVA tenderness, guarding or rebound.  Musculoskeletal:        General: No swelling or tenderness. Normal range of motion.     Cervical back: Normal range of motion. No rigidity  or tenderness.     Right lower leg: No edema.     Left lower leg: No edema.     Comments: Arthritic changes to fingers  Right ring trigger finger   Lymphadenopathy:     Cervical: No cervical adenopathy.  Skin:    General: Skin is warm and  dry.     Coloration: Skin is not pale.     Findings: No bruising, erythema, lesion or rash.  Neurological:     Mental Status: She is alert and oriented to person, place, and time.     Cranial Nerves: No cranial nerve deficit.     Sensory: No sensory deficit.     Motor: No weakness.     Gait: Gait normal.  Psychiatric:        Mood and Affect: Mood normal.        Speech: Speech normal.        Behavior: Behavior normal.        Thought Content: Thought content normal.        Judgment: Judgment normal.    Labs reviewed: Recent Labs    06/30/20 0000  NA 136  K 4.3  CL 102  CO2 23  GLUCOSE 85  BUN 9  CREATININE 0.74  CALCIUM 9.0   Recent Labs    06/30/20 0000  AST 21  ALT 14  BILITOT 0.9  PROT 6.7   Recent Labs    06/30/20 0000  WBC 4.4  NEUTROABS 2,521  HGB 12.4  HCT 37.1  MCV 95.1  PLT 240   Lab Results  Component Value Date   TSH 2.39 04/07/2019   No results found for: HGBA1C Lab Results  Component Value Date   CHOL 177 06/30/2020   HDL 61 06/30/2020   LDLCALC 93 06/30/2020   LDLDIRECT 100.0 04/07/2019   TRIG 130 06/30/2020   CHOLHDL 2.9 06/30/2020    Significant Diagnostic Results in last 30 days:  No results found.  Assessment/Plan 1. Swelling of joint of right hand Arthritic change to fingers bilaterally.No signs of inflammation or  - CBC with Differential/Platelet - Rheumatoid Factor - C-reactive protein - Sedimentation rate - acetaminophen (TYLENOL) 500 MG tablet; Take 2 tablets (1,000 mg total) by mouth in the morning and at bedtime. Three a week  Dispense: 30 tablet; Refill: 5 - Notify provider if symptoms worsen or fail to improve   2. Trigger ring finger of right hand Unable to straighten  finger from a bend position.  - Ambulatory referral to Orthopedic Surgery  3. Pain of right hand Arthritic pain  Advised to take Tylenol as below  - acetaminophen (TYLENOL) 500 MG tablet; Take 2 tablets (1,000 mg total) by mouth in the morning and at bedtime. Three a week  Dispense: 30 tablet; Refill: 5  Family/ staff Communication: Reviewed plan of care with patient and daughter verbalized understanding.  Labs/tests ordered:  - CBC with Differential/Platelet - Rheumatoid Factor - C-reactive protein - Sedimentation rate  Next Appointment: As needed if symptoms worsen or fail to improve    Sandrea Hughs, NP

## 2020-10-11 NOTE — Patient Instructions (Addendum)
-   apply warm water compressor to right forearm for 5-10 minutes. - take Extra strength Tylenol 500 mg tablet Two by mouth twice daily for pain   - Referral ordered to see Orthopedic for right ring trigger finger   - Labs drawn today will call you with results   - Notify provider if symptoms worsen or fail to improve

## 2020-10-12 LAB — CBC WITH DIFFERENTIAL/PLATELET
Absolute Monocytes: 606 cells/uL (ref 200–950)
Basophils Absolute: 58 cells/uL (ref 0–200)
Basophils Relative: 0.8 %
Eosinophils Absolute: 73 cells/uL (ref 15–500)
Eosinophils Relative: 1 %
HCT: 40.5 % (ref 35.0–45.0)
Hemoglobin: 13.5 g/dL (ref 11.7–15.5)
Lymphs Abs: 2008 cells/uL (ref 850–3900)
MCH: 31 pg (ref 27.0–33.0)
MCHC: 33.3 g/dL (ref 32.0–36.0)
MCV: 92.9 fL (ref 80.0–100.0)
MPV: 8.8 fL (ref 7.5–12.5)
Monocytes Relative: 8.3 %
Neutro Abs: 4555 cells/uL (ref 1500–7800)
Neutrophils Relative %: 62.4 %
Platelets: 258 10*3/uL (ref 140–400)
RBC: 4.36 10*6/uL (ref 3.80–5.10)
RDW: 12.9 % (ref 11.0–15.0)
Total Lymphocyte: 27.5 %
WBC: 7.3 10*3/uL (ref 3.8–10.8)

## 2020-10-12 LAB — C-REACTIVE PROTEIN: CRP: 1.4 mg/L (ref <8.0)

## 2020-10-12 LAB — SEDIMENTATION RATE: Sed Rate: 2 mm/h (ref 0–30)

## 2020-10-12 LAB — RHEUMATOID FACTOR: Rheumatoid fact SerPl-aCnc: <14 IU/mL (ref <14)

## 2020-11-03 ENCOUNTER — Other Ambulatory Visit: Payer: Self-pay

## 2020-11-03 ENCOUNTER — Other Ambulatory Visit: Payer: Medicare Other

## 2020-11-03 DIAGNOSIS — I480 Paroxysmal atrial fibrillation: Secondary | ICD-10-CM

## 2020-11-03 DIAGNOSIS — J0101 Acute recurrent maxillary sinusitis: Secondary | ICD-10-CM

## 2020-11-03 DIAGNOSIS — E785 Hyperlipidemia, unspecified: Secondary | ICD-10-CM

## 2020-11-03 DIAGNOSIS — F419 Anxiety disorder, unspecified: Secondary | ICD-10-CM

## 2020-11-03 DIAGNOSIS — N1831 Chronic kidney disease, stage 3a: Secondary | ICD-10-CM

## 2020-11-08 ENCOUNTER — Ambulatory Visit: Payer: Medicare Other | Admitting: Family

## 2020-11-08 LAB — CBC WITH DIFFERENTIAL/PLATELET
Absolute Monocytes: 470 cells/uL (ref 200–950)
Basophils Absolute: 60 cells/uL (ref 0–200)
Basophils Relative: 1.2 %
Eosinophils Absolute: 110 cells/uL (ref 15–500)
Eosinophils Relative: 2.2 %
HCT: 39.7 % (ref 35.0–45.0)
Hemoglobin: 13.3 g/dL (ref 11.7–15.5)
Lymphs Abs: 1355 cells/uL (ref 850–3900)
MCH: 31.7 pg (ref 27.0–33.0)
MCHC: 33.5 g/dL (ref 32.0–36.0)
MCV: 94.7 fL (ref 80.0–100.0)
MPV: 8.7 fL (ref 7.5–12.5)
Monocytes Relative: 9.4 %
Neutro Abs: 3005 cells/uL (ref 1500–7800)
Neutrophils Relative %: 60.1 %
Platelets: 245 10*3/uL (ref 140–400)
RBC: 4.19 10*6/uL (ref 3.80–5.10)
RDW: 13.1 % (ref 11.0–15.0)
Total Lymphocyte: 27.1 %
WBC: 5 10*3/uL (ref 3.8–10.8)

## 2020-11-08 LAB — COMPLETE METABOLIC PANEL WITHOUT GFR
AG Ratio: 1.6 (calc) (ref 1.0–2.5)
ALT: 13 U/L (ref 6–29)
AST: 16 U/L (ref 10–35)
Albumin: 4.1 g/dL (ref 3.6–5.1)
Alkaline phosphatase (APISO): 59 U/L (ref 37–153)
BUN: 9 mg/dL (ref 7–25)
CO2: 26 mmol/L (ref 20–32)
Calcium: 9 mg/dL (ref 8.6–10.4)
Chloride: 100 mmol/L (ref 98–110)
Creat: 0.79 mg/dL (ref 0.60–0.95)
Globulin: 2.6 g/dL (ref 1.9–3.7)
Glucose, Bld: 82 mg/dL (ref 65–99)
Potassium: 4.3 mmol/L (ref 3.5–5.3)
Sodium: 135 mmol/L (ref 135–146)
Total Bilirubin: 0.7 mg/dL (ref 0.2–1.2)
Total Protein: 6.7 g/dL (ref 6.1–8.1)
eGFR: 73 mL/min/{1.73_m2}

## 2020-11-08 LAB — LIPID PANEL
Cholesterol: 178 mg/dL (ref <200)
HDL: 66 mg/dL (ref >=50)
LDL Cholesterol (Calc): 90 mg/dL (calc)
Non-HDL Cholesterol (Calc): 112 mg/dL (calc) (ref <130)
Total CHOL/HDL Ratio: 2.7 (calc) (ref <5.0)
Triglycerides: 122 mg/dL (ref <150)

## 2020-11-08 LAB — TEST AUTHORIZATION

## 2020-11-08 LAB — TSH: TSH: 5.36 m[IU]/L — ABNORMAL HIGH (ref 0.40–4.50)

## 2020-11-08 LAB — T3, FREE: T3, Free: 3.1 pg/mL (ref 2.3–4.2)

## 2020-11-08 LAB — T4, FREE: Free T4: 1.1 ng/dL (ref 0.8–1.8)

## 2020-11-09 ENCOUNTER — Encounter: Payer: Self-pay | Admitting: Family

## 2020-11-10 ENCOUNTER — Other Ambulatory Visit: Payer: Self-pay

## 2020-11-10 ENCOUNTER — Encounter: Payer: Self-pay | Admitting: Family

## 2020-11-10 ENCOUNTER — Ambulatory Visit: Payer: Medicare Other | Admitting: Family

## 2020-11-10 VITALS — BP 118/70 | HR 68 | Temp 96.9°F | Resp 20 | Ht 60.0 in | Wt 164.8 lb

## 2020-11-10 DIAGNOSIS — F419 Anxiety disorder, unspecified: Secondary | ICD-10-CM

## 2020-11-10 DIAGNOSIS — R42 Dizziness and giddiness: Secondary | ICD-10-CM

## 2020-11-10 DIAGNOSIS — R399 Unspecified symptoms and signs involving the genitourinary system: Secondary | ICD-10-CM | POA: Diagnosis not present

## 2020-11-10 DIAGNOSIS — N1831 Chronic kidney disease, stage 3a: Secondary | ICD-10-CM

## 2020-11-10 DIAGNOSIS — I480 Paroxysmal atrial fibrillation: Secondary | ICD-10-CM | POA: Diagnosis not present

## 2020-11-10 DIAGNOSIS — E785 Hyperlipidemia, unspecified: Secondary | ICD-10-CM | POA: Diagnosis not present

## 2020-11-10 DIAGNOSIS — R7989 Other specified abnormal findings of blood chemistry: Secondary | ICD-10-CM

## 2020-11-10 LAB — POCT URINALYSIS DIPSTICK
Bilirubin, UA: NEGATIVE
Glucose, UA: NEGATIVE
Ketones, UA: NEGATIVE
Leukocytes, UA: NEGATIVE
Nitrite, UA: NEGATIVE
Protein, UA: NEGATIVE
Spec Grav, UA: 1.02 (ref 1.010–1.025)
Urobilinogen, UA: 0.2 E.U./dL
pH, UA: 6.5 (ref 5.0–8.0)

## 2020-11-10 NOTE — Progress Notes (Signed)
Provider: Marlowe Sax FNP-C   Arelys Glassco, [name], NP  Patient Care Team: Nyemah Watton, [name], NP as PCP - General (Family Medicine) Burnell Blanks, MD as PCP - Cardiology (Cardiology) Bjorn Loser, MD (Urology) Particia Nearing, MD (Dermatology) Luberta Mutter, MD (Ophthalmology) Almedia Balls, MD (Orthopedic Surgery) Ouida Sills  (Rheumatology) Jarome Matin, MD as Consulting Physician (Dermatology) Gatha Mayer, MD as Consulting Physician (Gastroenterology) Rozetta Nunnery, MD as Consulting Physician (Otolaryngology)  Extended Emergency Contact Information Primary Emergency Contact: Apple,Karen Address: [address]          [place] [address] [name] of [place] Phone: [phone] Work Phone: [phone] Mobile Phone: [phone] Relation: Daughter  Code Status: Full Code  Goals of care: Advanced Directive information Advanced Directives 11/09/2020  Does Patient Have a Medical Advance Directive? Yes  Type of Advance Directive [place]  Does patient want to make changes to medical advance directive? No - Patient declined  Copy of [place] in Chart? Yes - validated most recent copy scanned in chart (See row information)  Would patient like information on creating a medical advance directive? -  Pre-existing out of facility DNR order (yellow form or pink MOST form) -     Chief Complaint  Patient presents with   Medical Management of Chronic Issues    4 month follow up   Immunizations    Discuss the need for influenza vaccine, and 2nd covid booster.     HPI:  Pt is a 87 y.o. female seen today for 4 months follow up for medical management of chronic diseases.Has a medical history of Atrial fibrillation,Hyperlipidemia,CKD stage 3 b ,Generalized Anxiety disorder,Dizziness,GERD,Osteoporosis,PMR,DDD,chronic dizziness among others.   She complains of worsening frequent urination.Associates with some  lower abdominal pain.she denies any fever,chills,dysuria,difficult urination or urgency. Previous labs TSH 5.36 with normal T 3 and T 4   Past Medical History:  Diagnosis Date   Abdominal pain 12/2009   hospitalized   Allergic rhinitis    Anxiety    Bladder problem    Chronic constipation    Compression fx, lumbar spine (HCC)    DDD (degenerative disc disease), lumbar    Diverticulosis of colon    Dog bite(E906.0) 08/30/2011   Dysrhythmia    GERD (gastroesophageal reflux disease)    Hepatic steatosis    pt denies   Hyperlipidemia    Hyperplastic colon polyp    IBS (irritable bowel syndrome)    constipation predominant   Internal hemorrhoids    Osteoporosis    dexa 2011 -2.7 hip nl spine   PAF (paroxysmal atrial fibrillation) ([facility])    a. dx 05/2017.   PMR (polymyalgia rheumatica) ([facility]) 08/30/2011   under rheum care  on low dose pred 5 mg    Renal disorder    Past Surgical History:  Procedure Laterality Date   ABDOMINAL HYSTERECTOMY     with bladder tact and rectocele repair   BLADDER SURGERY     Streched   CATARACT EXTRACTION  2010   both   COLONOSCOPY  2016 was most recent   CYSTO WITH HYDRODISTENSION N/A 10/05/2019   Procedure: CYSTOSCOPY/HYDRODISTENSION;  Surgeon: Bjorn Loser, MD;  Location: [facility];  Service: Urology;  Laterality: N/A;   TONSILLECTOMY AND ADENOIDECTOMY      Allergies  Allergen Reactions   Alendronate Sodium     upset stomach   Penicillins     Has patient had a PCN reaction causing immediate rash, facial/tongue/throat swelling, SOB or lightheadedness  with hypotension: Yes Has patient had a PCN reaction causing severe rash involving mucus membranes or skin necrosis: No Has patient had a PCN reaction that required hospitalization: No Has patient had a PCN reaction occurring within the last 10 years: No If all of the above answers are "NO", then may proceed with Cephalosporin use.    Amoxicillin Rash    Has patient had a PCN reaction causing  immediate rash, facial/tongue/throat swelling, SOB or lightheadedness with hypotension: yes Has patient had a PCN reaction causing severe rash involving mucus membranes or skin necrosis: no Has patient had a PCN reaction that required hospitalization: no Has patient had PCN reaction within the last 10 years: no  If all of the above answers are "NO", then may proceed with Cephalosporin use.  REACTION: unspecified   Metronidazole Other (See Comments)    Pounding headaches patient says was bad.  With cipro for diverticulitis rx. Other reaction(s): Other (See Comments) Pounding headaches patient says was bad.  With cipro for diverticulitis rx.    Allergies as of 11/10/2020       Reactions   Alendronate Sodium    upset stomach   Penicillins    Has patient had a PCN reaction causing immediate rash, facial/tongue/throat swelling, SOB or lightheadedness with hypotension: Yes Has patient had a PCN reaction causing severe rash involving mucus membranes or skin necrosis: No Has patient had a PCN reaction that required hospitalization: No Has patient had a PCN reaction occurring within the last 10 years: No If all of the above answers are "NO", then may proceed with Cephalosporin use.   Amoxicillin Rash   Has patient had a PCN reaction causing immediate rash, facial/tongue/throat swelling, SOB or lightheadedness with hypotension: yes Has patient had a PCN reaction causing severe rash involving mucus membranes or skin necrosis: no Has patient had a PCN reaction that required hospitalization: no Has patient had PCN reaction within the last 10 years: no  If all of the above answers are "NO", then may proceed with Cephalosporin use. REACTION: unspecified   Metronidazole Other (See Comments)   Pounding headaches patient says was bad.  With cipro for diverticulitis rx. Other reaction(s): Other (See Comments) Pounding headaches patient says was bad.  With cipro for diverticulitis rx.         Medication List        Accurate as of November 10, 2020  3:11 PM. If you have any questions, ask your nurse or doctor.          acetaminophen 500 MG tablet Commonly known as: TYLENOL Take 2 tablets (1,000 mg total) by mouth in the morning and at bedtime. Three a week   Align 4 MG Caps Take 1 capsule by mouth daily.   calcium carbonate 1250 (500 Ca) MG chewable tablet Commonly known as: OS-CAL Chew 1 tablet by mouth daily.   diltiazem 120 MG 24 hr capsule Commonly known as: CARDIZEM CD TAKE 1 CAPSULE BY MOUTH EVERY DAY   Eliquis 5 MG Tabs tablet Generic drug: apixaban TAKE 1 TABLET BY MOUTH TWICE A DAY   escitalopram 5 MG tablet Commonly known as: LEXAPRO Take one tablet by mouth once daily at bedtime.   guaiFENesin 600 MG 12 hr tablet Commonly known as: MUCINEX Take 600 mg by mouth daily.   loratadine 10 MG tablet Commonly known as: CLARITIN Take 10 mg by mouth every other day.   magnesium hydroxide 400 MG/5ML suspension Commonly known as: MILK OF MAGNESIA Take 15 mLs by  mouth daily as needed for mild constipation.   multivitamin tablet Take 1 tablet by mouth daily.   polyethylene glycol powder 17 GM/SCOOP powder Commonly known as: GLYCOLAX/MIRALAX Take 17 g by mouth daily. Hold for loose stool   Premarin vaginal cream Generic drug: conjugated estrogens INSERT 1 GRAM VAGINALLY THREE TIMES A WEEKLY, THEN ONCE WEEKLY   Restasis 0.05 % ophthalmic emulsion Generic drug: cycloSPORINE Place 1 drop into both eyes 2 (two) times daily.   triamcinolone 55 MCG/ACT Aero nasal inhaler Commonly known as: NASACORT 2 SPRAYS INTO EACH NOSTRIL AT NIGHT   vitamin C 100 MG tablet Take 500 mg by mouth daily.        Review of Systems  Constitutional:  Negative for appetite change, chills, fatigue, fever and unexpected weight change.  HENT:  Negative for congestion, dental problem, ear discharge, ear pain, facial swelling, hearing loss, nosebleeds, postnasal drip,  rhinorrhea, sinus pressure, sinus pain, sneezing, sore throat, tinnitus and trouble swallowing.   Eyes:  Negative for pain, discharge, redness, itching and visual disturbance.  Respiratory:  Negative for cough, chest tightness, shortness of breath and wheezing.   Cardiovascular:  Negative for chest pain, palpitations and leg swelling.  Gastrointestinal:  Negative for abdominal pain, blood in stool, constipation, diarrhea, nausea and vomiting.       Lower abdominal pain   Endocrine: Negative for cold intolerance, heat intolerance, polydipsia, polyphagia and polyuria.  Genitourinary:  Positive for frequency. Negative for difficulty urinating, dysuria, flank pain and urgency.  Musculoskeletal:  Negative for arthralgias, back pain, gait problem, joint swelling, myalgias, neck pain and neck stiffness.  Skin:  Negative for color change, pallor, rash and wound.  Neurological:  Negative for dizziness, syncope, speech difficulty, weakness, light-headedness, numbness and headaches.  Hematological:  Does not bruise/bleed easily.  Psychiatric/Behavioral:  Negative for agitation, behavioral problems, confusion, hallucinations, self-injury, sleep disturbance and suicidal ideas. The patient is nervous/anxious.    Immunization History  Administered Date(s) Administered   Fluad Quad(high Dose 65+) 01/10/2020   Influenza Split 01/25/2011, 01/23/2012   Influenza Whole 12/31/2007   Influenza, High Dose Seasonal PF 01/19/2013, 01/10/2014, 01/17/2015, 01/18/2016, 01/02/2017, 01/05/2018, 01/05/2019, 01/05/2019   Influenza-Unspecified 01/02/2017   PFIZER(Purple Top)SARS-COV-2 Vaccination 05/07/2019, 06/02/2019, 01/31/2020   Pneumococcal Conjugate-13 02/23/2013   Pneumococcal Polysaccharide-23 04/01/2006, 04/01/2018   Td 04/01/2005   Tdap 08/30/2011   Zoster Recombinat (Shingrix) 12/04/2016, 07/27/2017   Zoster, Live 04/02/2007   Pertinent  Health Maintenance Due  Topic Date Due   INFLUENZA VACCINE  10/30/2020    MAMMOGRAM  09/09/2022   DEXA SCAN  Completed   PNA vac Low Risk Adult  Completed   Fall Risk  11/10/2020 11/09/2020 10/11/2020 07/10/2020 05/15/2020  Falls in the past year? 0 0 0 0 0  Number falls in past yr: 0 0 0 0 0  Injury with Fall? 0 0 0 0 0  Comment - - - - -  Risk for fall due to : History of fall(s) No Fall Risks No Fall Risks - -  Follow up Falls evaluation completed;Education provided;Falls prevention discussed Falls evaluation completed Falls evaluation completed - -   Functional Status Survey:    Vitals:   11/10/20 1443  BP: 118/70  Pulse: (!) 49  Resp: 20  Temp: (!) 96.9 F (36.1 C)  TempSrc: Temporal  SpO2: 96%  Weight: 164 lb 12.8 oz (74.8 kg)  Height: 5' (1.524 m)   Body mass index is 32.19 kg/m. Physical Exam Vitals reviewed.  Constitutional:  General: She is not in acute distress.    Appearance: Normal appearance. She is normal weight. She is not ill-appearing or diaphoretic.  HENT:     Head: Normocephalic.     Right Ear: Tympanic membrane, ear canal and external ear normal. There is no impacted cerumen.     Left Ear: Tympanic membrane, ear canal and external ear normal. There is no impacted cerumen.     Nose: Nose normal. No congestion or rhinorrhea.     Mouth/Throat:     Mouth: Mucous membranes are moist.     Pharynx: Oropharynx is clear. No oropharyngeal exudate or posterior oropharyngeal erythema.  Eyes:     General: No scleral icterus.       Right eye: No discharge.        Left eye: No discharge.     Extraocular Movements: Extraocular movements intact.     Conjunctiva/sclera: Conjunctivae normal.     Pupils: Pupils are equal, round, and reactive to light.  Neck:     Vascular: No carotid bruit.  Cardiovascular:     Rate and Rhythm: Normal rate and regular rhythm.     Pulses: Normal pulses.     Heart sounds: Normal heart sounds. No murmur heard.   No friction rub. No gallop.  Pulmonary:     Effort: Pulmonary effort is normal. No  respiratory distress.     Breath sounds: Normal breath sounds. No wheezing, rhonchi or rales.  Chest:     Chest wall: No tenderness.  Abdominal:     General: Bowel sounds are normal. There is no distension.     Palpations: Abdomen is soft. There is no mass.     Tenderness: There is abdominal tenderness in the suprapubic area. There is no right CVA tenderness, left CVA tenderness, guarding or rebound.  Musculoskeletal:        General: No swelling or tenderness. Normal range of motion.     Cervical back: Normal range of motion. No rigidity or tenderness.     Right lower leg: No edema.     Left lower leg: No edema.     Comments: Arthritic changes on fingers noted   Lymphadenopathy:     Cervical: No cervical adenopathy.  Skin:    General: Skin is warm and dry.     Coloration: Skin is not pale.     Findings: No bruising, erythema, lesion or rash.  Neurological:     Mental Status: She is alert and oriented to person, place, and time.     Cranial Nerves: No cranial nerve deficit.     Sensory: No sensory deficit.     Motor: No weakness.     Coordination: Coordination normal.     Gait: Gait abnormal.  Psychiatric:        Mood and Affect: Mood normal.        Speech: Speech normal.        Behavior: Behavior normal.        Thought Content: Thought content normal.        Judgment: Judgment normal.    Labs reviewed: Recent Labs    06/30/20 0000 11/03/20 0931  NA 136 135  K 4.3 4.3  CL 102 100  CO2 23 26  GLUCOSE 85 82  BUN 9 9  CREATININE 0.74 0.79  CALCIUM 9.0 9.0   Recent Labs    06/30/20 0000 11/03/20 0931  AST 21 16  ALT 14 13  BILITOT 0.9 0.7  PROT 6.7 6.7  Recent Labs    06/30/20 0000 10/11/20 1536 11/03/20 0931  WBC 4.4 7.3 5.0  NEUTROABS 2,521 4,555 3,005  HGB 12.4 13.5 13.3  HCT 37.1 40.5 39.7  MCV 95.1 92.9 94.7  PLT 240 258 245   Lab Results  Component Value Date   TSH 5.36 (H) 11/03/2020   No results found for: HGBA1C Lab Results  Component  Value Date   CHOL 178 11/03/2020   HDL 66 11/03/2020   LDLCALC 90 11/03/2020   LDLDIRECT 100.0 04/07/2019   TRIG 122 11/03/2020   CHOLHDL 2.7 11/03/2020    Significant Diagnostic Results in last 30 days:  No results found.  Assessment/Plan  1. UTI symptoms Afebrile. - POCT urinalysis dipstick indicates golden yellow urine with moderate blood but negative for Nitrites and Leukocytes.  - Urine Culture  2. AF (paroxysmal atrial fibrillation) (HCC) Symptoms stable on EliQuis.HR was 49 on arrival but improved on recheck. - EKG 12-Lead indicates Afib irregular conduction with HR 67 b/min  - continue on Eliquis and Diltiazem  - CBC with Differential/Platelet; Future - CMP with eGFR([facility]); Future  3. Hyperlipidemia goal < 100  LDL not at goal  - continue dietary modification  - Lipid panel; Future  4. Anxiety Continue on escitalopram 5 mg tablet daily   5. Dizziness Chronic  EKG results as above  - B/p without any Orthostasis  - CBC with Differential/Platelet; Future  6. Stage 3a chronic kidney disease (HCC) CR at baseline will continue to avoid Nephrotoxins and dose all other medication for renal clearance  - CMP with eGFR([facility]); Future  7. Abnormal TSH Lab Results  Component Value Date   TSH 5.36 (H) 11/03/2020  T3 and T4 within normal range.  - TSH; Future  Family/ staff Communication: Reviewed plan of care with patient verbalized understanding   Labs/tests ordered:   - CBC with Differential/Platelet - CMP with eGFR([facility]) - TSH - Lipid panel - POCT urinalysis dipstick   - Urine Culture  Next Appointment : 6 months for medical management of chronic issues.Fasting Labs prior to visit.    Sandrea Hughs, NP

## 2020-11-11 LAB — URINE CULTURE
MICRO NUMBER:: 12236998
SPECIMEN QUALITY:: ADEQUATE

## 2020-11-13 ENCOUNTER — Other Ambulatory Visit: Payer: Medicare Other

## 2020-11-13 ENCOUNTER — Other Ambulatory Visit: Payer: Self-pay

## 2020-11-13 DIAGNOSIS — R399 Unspecified symptoms and signs involving the genitourinary system: Secondary | ICD-10-CM

## 2020-11-13 DIAGNOSIS — R350 Frequency of micturition: Secondary | ICD-10-CM

## 2020-11-16 LAB — URINE CULTURE
MICRO NUMBER:: 12252261
SPECIMEN QUALITY:: ADEQUATE

## 2020-11-17 ENCOUNTER — Other Ambulatory Visit: Payer: Self-pay

## 2020-11-17 DIAGNOSIS — R399 Unspecified symptoms and signs involving the genitourinary system: Secondary | ICD-10-CM

## 2020-11-18 LAB — URINE CULTURE
MICRO NUMBER:: 12267084
SPECIMEN QUALITY:: ADEQUATE

## 2020-11-20 ENCOUNTER — Other Ambulatory Visit: Payer: Self-pay | Admitting: Cardiovascular Disease

## 2020-11-20 NOTE — Telephone Encounter (Signed)
Eliquis 5 mg refill request received. Patient is 87 years old, weight-74.8 kg, Crea- 0.79 on 11/03/20 via epic, Diagnosis- afib, and last seen by Dr. Angelena Form on 05/28/20. Dose is appropriate based on dosing criteria. Will send in refill to requested pharmacy.

## 2020-12-13 ENCOUNTER — Telehealth: Payer: Self-pay

## 2020-12-13 ENCOUNTER — Other Ambulatory Visit: Payer: Self-pay

## 2020-12-13 ENCOUNTER — Encounter: Payer: Self-pay | Admitting: Family

## 2020-12-13 ENCOUNTER — Ambulatory Visit (INDEPENDENT_AMBULATORY_CARE_PROVIDER_SITE_OTHER): Payer: Medicare Other | Admitting: Family

## 2020-12-13 DIAGNOSIS — Z0000 Encounter for general adult medical examination without abnormal findings: Secondary | ICD-10-CM | POA: Diagnosis not present

## 2020-12-13 NOTE — Telephone Encounter (Signed)
Ashley Lawson, Ashley Lawson are scheduled for a virtual visit with your provider today.    Just as we do with appointments in the office, we must obtain your consent to participate.  Your consent will be active for this visit and any virtual visit you may have with one of our providers in the next 365 days.    If you have a MyChart account, I can also send a copy of this consent to you electronically.  All virtual visits are billed to your insurance company just like a traditional visit in the office.  As this is a virtual visit, video technology does not allow for your provider to perform a traditional examination.  This may limit your provider's ability to fully assess your condition.  If your provider identifies any concerns that need to be evaluated in person or the need to arrange testing such as labs, EKG, etc, we will make arrangements to do so.    Although advances in technology are sophisticated, we cannot ensure that it will always work on either your end or our end.  If the connection with a video visit is poor, we may have to switch to a telephone visit.  With either a video or telephone visit, we are not always able to ensure that we have a secure connection.   I need to obtain your verbal consent now.   Are you willing to proceed with your visit today?   Ashley Lawson has provided verbal consent on 12/13/2020 for a virtual visit (video or telephone).   Ashley Lawson, [place] 12/13/2020  3:00 pm

## 2020-12-13 NOTE — Progress Notes (Signed)
This service is provided via telemedicine  No vital signs collected/recorded due to the encounter was a telemedicine visit.   Location of patient (ex: home, work):  Home  Patient consents to a telephone visit: Yes, see telephone visit dated   Location of the provider (ex: office, home):  [facility], Office   Name of any referring provider:  N/A  Names of all persons participating in the telemedicine service and their role in the encounter:  S.Chrae B/CMA, Shikira Folino NP, Santiago Glad (daughter), and Patient   Time spent on call:  7 min with medical assistant     Subjective:   Ashley Lawson is a 87 y.o. female who presents for Medicare Annual (Subsequent) preventive examination.  Review of Systems     Cardiac Risk Factors include: advanced age (>39mn, >>52women);hypertension;obesity (BMI >30kg/m2)     Objective:    There were no vitals filed for this visit. There is no height or weight on file to calculate BMI.  Advanced Directives 12/13/2020 11/09/2020 10/11/2020 07/10/2020 05/15/2020 04/26/2020 03/06/2020  Does Patient Have a Medical Advance Directive? Yes Yes Yes Yes Yes - Yes  Type of Advance Directive [place] will Healthcare Power of [place] of [place] DNR (pink MOST or yellow form)  Does patient want to make changes to medical advance directive? No - Patient declined No - Patient declined No - Patient declined No - Patient declined No - Patient declined No - Patient declined No - Patient declined  Copy of [place] Chart? Yes - validated most recent copy scanned in chart (See row information) Yes - validated most recent copy scanned in chart (See row information) Yes - validated most recent copy scanned in chart (See row information) Yes - validated most  recent copy scanned in chart (See row information) Yes - validated most recent copy scanned in chart (See row information) Yes - validated most recent copy scanned in chart (See row information) Yes - validated most recent copy scanned in chart (See row information)  Would patient like information on creating a medical advance directive? - - - - - - -  Pre-existing out of facility DNR order (yellow form or pink MOST form) - - - - - - Pink MOST/Yellow Form most recent copy in chart - Physician notified to receive inpatient order    Current Medications (verified) Outpatient Encounter Medications as of 12/13/2020  Medication Sig   acetaminophen (TYLENOL) 500 MG tablet Take 500 mg by mouth at bedtime as needed.   Ascorbic Acid (VITAMIN C) 100 MG tablet Take 500 mg by mouth daily.   calcium carbonate (OS-CAL) 1250 (500 Ca) MG chewable tablet Chew 1 tablet by mouth daily.    diltiazem (CARDIZEM CD) 120 MG 24 hr capsule TAKE 1 CAPSULE BY MOUTH EVERY DAY   ELIQUIS 5 MG TABS tablet TAKE 1 TABLET BY MOUTH TWICE A DAY   escitalopram (LEXAPRO) 5 MG tablet Take one tablet by mouth once daily at bedtime.   guaiFENesin (MUCINEX) 600 MG 12 hr tablet Take 600 mg by mouth daily as needed.   loratadine (CLARITIN) 10 MG tablet Take 10 mg by mouth daily as needed.   magnesium hydroxide (MILK OF MAGNESIA) 400 MG/5ML suspension Take 15 mLs by mouth daily as needed for mild constipation.   Multiple Vitamin (MULTIVITAMIN) tablet Take 1  tablet by mouth daily.   polyethylene glycol powder (GLYCOLAX/MIRALAX) 17 GM/SCOOP powder Take 17 g by mouth daily. Hold for loose stool   PREMARIN vaginal cream INSERT 1 GRAM VAGINALLY THREE TIMES A WEEKLY, THEN ONCE WEEKLY   Probiotic Product (ALIGN) 4 MG CAPS Take 1 capsule by mouth as needed.   RESTASIS 0.05 % ophthalmic emulsion Place 1 drop into both eyes 2 (two) times daily.   triamcinolone (NASACORT) 55 MCG/ACT AERO nasal inhaler 2 SPRAYS INTO EACH NOSTRIL AT NIGHT    [DISCONTINUED] acetaminophen (TYLENOL) 500 MG tablet Take 2 tablets (1,000 mg total) by mouth in the morning and at bedtime. Three a week   No facility-administered encounter medications on file as of 12/13/2020.    Allergies (verified) Alendronate sodium, Penicillins, Amoxicillin, and Metronidazole   History: Past Medical History:  Diagnosis Date   Abdominal pain 12/2009   hospitalized   Allergic rhinitis    Anxiety    Bladder problem    Chronic constipation    Compression fx, lumbar spine (HCC)    DDD (degenerative disc disease), lumbar    Diverticulosis of colon    Dog bite(E906.0) 08/30/2011   Dysrhythmia    GERD (gastroesophageal reflux disease)    Hepatic steatosis    pt denies   Hyperlipidemia    Hyperplastic colon polyp    IBS (irritable bowel syndrome)    constipation predominant   Internal hemorrhoids    Osteoporosis    dexa 2011 -2.7 hip nl spine   PAF (paroxysmal atrial fibrillation) ([place])    a. dx 05/2017.   PMR (polymyalgia rheumatica) ([place]) 08/30/2011   under rheum care  on low dose pred 5 mg    Renal disorder    Past Surgical History:  Procedure Laterality Date   ABDOMINAL HYSTERECTOMY     with bladder tact and rectocele repair   BLADDER SURGERY     Streched   CATARACT EXTRACTION  2010   both   COLONOSCOPY  2016 was most recent   CYSTO WITH HYDRODISTENSION N/A 10/05/2019   Procedure: CYSTOSCOPY/HYDRODISTENSION;  Surgeon: Bjorn Loser, MD;  Location: [facility];  Service: Urology;  Laterality: N/A;   TONSILLECTOMY AND ADENOIDECTOMY     Family History  Problem Relation Age of Onset   Stroke Mother 63   Liver disease Brother 22   Heart attack Father 17   Alzheimer's disease Sister 87   Bladder Cancer Sister 67   Crohn's disease Son    Colon cancer Neg Hx    Esophageal cancer Neg Hx    Stomach cancer Neg Hx    Rectal cancer Neg Hx    Liver cancer Neg Hx    Social History   Socioeconomic History   Marital status: Widowed    Spouse name: Not  on file   Number of children: Not on file   Years of education: Not on file   Highest education level: Not on file  Occupational History   Occupation: retired  Tobacco Use   Smoking status: Never   Smokeless tobacco: Never  Vaping Use   Vaping Use: Never used  Substance and Sexual Activity   Alcohol use: Yes    Alcohol/week: 0.0 standard drinks    Comment: occ   Drug use: Never   Sexual activity: Not Currently  Other Topics Concern   Not on file  Social History Narrative   Retired   Regular exercise- yes   Widowed   At home with children and [place]  of 2     Puppy   Plays cards is social and active       Diet:  No      Do you drink/ eat things with caffeine? Yes      Marital status:   Widow                            What year were you married ? 7510,[id number]      Do you live in a house, apartment,assistred living, condo, trailer, etc.)? Townhouse      Is it one or more stories? 2      How many persons live in your home ? 2      Do you have any pets in your home ?(please list)  None      Highest Level of education completed:  12 th Grade      Current or past profession: Retail Sales @ [employer]      Do you exercise?    Some                          Type & how often  Walking      ADVANCED DIRECTIVES (Please bring copies)      Do you have a living will? Yes      Do you have a DNR form?  Yes                     If not, do you want to discuss one?       Do you have signed POA?HPOA forms? Yes                If so, please bring to your appointment      FUNCTIONAL STATUS- To be completed by Spouse / child / Staff       Do you have difficulty bathing or dressing yourself ? No      Do you have difficulty preparing food or eating ?  No      Do you have difficulty managing your mediation ? No      Do you have difficulty managing your finances ? No      Do you have difficulty affording your medication ? No      Social Determinants of Radio broadcast assistant  Strain: Not on file  Food Insecurity: Not on file  Transportation Needs: Not on file  Physical Activity: Not on file  Stress: Not on file  Social Connections: Not on file    Tobacco Counseling Counseling given: Not Answered   Clinical Intake:  Pre-visit preparation completed: Yes  Pain : No/denies pain     BMI - recorded: 32.19 Nutritional Status: BMI > 30  Obese Nutritional Risks: None Diabetes: No  How often do you need to have someone help you when you read instructions, pamphlets, or other written materials from your doctor or pharmacy?: 1 - Never What is the last grade level you completed in school?: 12 grade  Diabetic?No   Interpreter Needed?: No  Information entered by :: Yaniris Braddock,FNP-C   Activities of Daily Living In your present state of health, do you have any difficulty performing the following activities: 12/13/2020  Hearing? N  Vision? N  Difficulty concentrating or making decisions? N  Walking or climbing stairs? N  Dressing or bathing? N  Doing errands, shopping? Y  Comment daughter assist  Preparing  Food and eating ? N  Using the Toilet? Y  Comment constipation  In the past six months, have you accidently leaked urine? N  Do you have problems with loss of bowel control? N  Managing your Medications? N  Managing your Finances? N  Housekeeping or managing your Housekeeping? Y  Comment daughter assist  Some recent data might be hidden    Patient Care Team: Allyssia Skluzacek, [name], NP as PCP - General (Family Medicine) Burnell Blanks, MD as PCP - Cardiology (Cardiology) Bjorn Loser, MD (Urology) Particia Nearing, MD (Dermatology) Luberta Mutter, MD (Ophthalmology) Almedia Balls, MD (Orthopedic Surgery) Ouida Sills  (Rheumatology) Jarome Matin, MD as Consulting Physician (Dermatology) Gatha Mayer, MD as Consulting Physician (Gastroenterology) Rozetta Nunnery, MD as Consulting Physician (Otolaryngology)  Indicate any  recent Medical Services you may have received from other than [facility] providers in the past year (date may be approximate).     Assessment:   This is a routine wellness examination for [place].  Hearing/Vision screen Hearing Screening - Comments:: Patient with slight decrease in hearing, doesn't feel there is a need for hearing aids. Vision Screening - Comments:: Last eye exam less than 12 months ago Dr.McCuen   Dietary issues and exercise activities discussed: Current Exercise Habits: The patient does not participate in regular exercise at present, Exercise limited by: None identified   Goals Addressed               This Visit's Progress     p (pt-stated)        Would like to walk better        Depression Screen PHQ 2/9 Scores 12/13/2020 03/06/2020 11/29/2019 04/07/2019 04/08/2018 04/03/2017 01/31/2016  PHQ - 2 Score 0 0 0 0 0 0 0    Fall Risk Fall Risk  12/13/2020 11/10/2020 11/09/2020 10/11/2020 07/10/2020  Falls in the past year? 0 0 0 0 0  Number falls in past yr: 0 0 0 0 0  Injury with Fall? 0 0 0 0 0  Comment - - - - -  Risk for fall due to : No Fall Risks History of fall(s) No Fall Risks No Fall Risks -  Follow up Falls evaluation completed Falls evaluation completed;Education provided;Falls prevention discussed Falls evaluation completed Falls evaluation completed -    FALL RISK PREVENTION PERTAINING TO THE HOME:  Any stairs in or around the home? No  If so, are there any without handrails? No  Home free of loose throw rugs in walkways, pet beds, electrical cords, etc? No  Adequate lighting in your home to reduce risk of falls? Yes   ASSISTIVE DEVICES UTILIZED TO PREVENT FALLS:  Life alert? No  Use of a cane, walker or w/c? No  Grab bars in the bathroom? No  Shower chair or bench in shower? Yes  Elevated toilet seat or a handicapped toilet? Yes   TIMED UP AND GO:  Was the test performed? No .  Length of time to ambulate 10 feet: N/A  sec.   Gait slow and steady  without use of assistive device  Cognitive Function: MMSE - Mini Mental State Exam 01/10/2020  Orientation to time 5  Orientation to Place 5  Registration 3  Attention/ Calculation 5  Recall 3  Language- name 2 objects 2  Language- repeat 1  Language- follow 3 step command 3  Language- read & follow direction 1  Write a sentence 1  Copy design 0  Copy design-comments no to clock  Total score  [place] 12/13/2020  What Year? 0 points  What month? 0 points  What time? 0 points  Count back from 20 0 points  Months in reverse 0 points  Repeat phrase 2 points  Total Score 2    Immunizations Immunization History  Administered Date(s) Administered   Fluad Quad(high Dose 65+) 01/10/2020   Influenza Split 01/25/2011, 01/23/2012   Influenza Whole 12/31/2007   Influenza, High Dose Seasonal PF 01/19/2013, 01/10/2014, 01/17/2015, 01/18/2016, 01/02/2017, 01/05/2018, 01/05/2019, 01/05/2019   Influenza-Unspecified 01/02/2017   PFIZER(Purple Top)SARS-COV-2 Vaccination 05/07/2019, 06/02/2019, 01/31/2020, 08/02/2020   Pneumococcal Conjugate-13 02/23/2013   Pneumococcal Polysaccharide-23 04/01/2006, 04/01/2018   Td 04/01/2005   Td (Adult) 11/22/2016   Tdap 08/30/2011   Zoster Recombinat (Shingrix) 12/04/2016, 07/27/2017   Zoster, Live 04/02/2007    TDAP status: Up to date  Flu Vaccine status: Due, Education has been provided regarding the importance of this vaccine. Advised may receive this vaccine at local pharmacy or Health Dept. Aware to provide a copy of the vaccination record if obtained from local pharmacy or Health Dept. Verbalized acceptance and understanding.  Pneumococcal vaccine status: Up to date  Covid-19 vaccine status: Information provided on how to obtain vaccines.   Qualifies for Shingles Vaccine? Yes   Zostavax completed Yes   Shingrix Completed?: Yes  Screening Tests Health Maintenance  Topic Date Due   INFLUENZA VACCINE  10/30/2020   COVID-19  Vaccine (5 - Booster for Pfizer series) 12/03/2020   TETANUS/TDAP  08/29/2021   MAMMOGRAM  09/09/2022   DEXA SCAN  Completed   PNA vac Low Risk Adult  Completed   Zoster Vaccines- Shingrix  Completed   HPV VACCINES  Aged Out    Health Maintenance  Health Maintenance Due  Topic Date Due   INFLUENZA VACCINE  10/30/2020   COVID-19 Vaccine (5 - Booster for [place] series) 12/03/2020    Colorectal cancer screening: No longer required.   Mammogram status: Completed 09/08/2020. Repeat every year  Bone Density status: Completed 04/21/2019. Results reflect: Bone density results: OSTEOPOROSIS. Repeat every 2 years.  Lung Cancer Screening: (Low Dose CT Chest recommended if Age 7-80 years, 30 pack-year currently smoking OR have quit w/in 15years.) does not qualify.   Lung Cancer Screening Referral: No  Additional Screening:  Hepatitis C Screening: does not qualify; Completed No   Vision Screening: Recommended annual ophthalmology exams for early detection of glaucoma and other disorders of the eye. Is the patient up to date with their annual eye exam?  Yes  Who is the provider or what is the name of the office in which the patient attends annual eye exams? Dr.McCuen  If pt is not established with a provider, would they like to be referred to a provider to establish care? No .   Dental Screening: Recommended annual dental exams for proper oral hygiene  Community Resource Referral / Chronic Care Management: CRR required this visit?  No   CCM required this visit?  No      Plan:     I have personally reviewed and noted the following in the patient's chart:   Medical and social history Use of alcohol, tobacco or illicit drugs  Current medications and supplements including opioid prescriptions.  Functional ability and status Nutritional status Physical activity Advanced directives List of other physicians Hospitalizations, surgeries, and ER visits in previous 12  months Vitals Screenings to include cognitive, depression, and falls Referrals and appointments  In addition, I have reviewed and discussed with  patient certain preventive protocols, quality metrics, and best practice recommendations. A written personalized care plan for preventive services as well as general preventive health recommendations were provided to patient.     Sandrea Hughs, NP   12/13/2020   Nurse Notes: Due for influenza vaccine and COVID-19  5th vaccine

## 2020-12-13 NOTE — Patient Instructions (Signed)
[name] , Thank you for taking time to come for your Medicare Wellness Visit. I appreciate your ongoing commitment to your health goals. Please review the following plan we discussed and let me know if I can assist you in the future.   Screening recommendations/referrals: Colonoscopy: N/A  Mammogram : Up to date  Bone Density : Up to date  Recommended yearly ophthalmology/optometry visit for glaucoma screening and checkup Recommended yearly dental visit for hygiene and checkup  Vaccinations: Influenza vaccine: Due  Pneumococcal vaccine : Up to date  Tdap vaccine : Up to date  Shingles vaccine : Up to date     Advanced directives: Yes   Conditions/risks identified: Advance age female > 29 yrs,Hypertension,Obesity,BMI   Next appointment: 1 year    Preventive Care 5 Years and Older, Female Preventive care refers to lifestyle choices and visits with your health care provider that can promote health and wellness. What does preventive care include? A yearly physical exam. This is also called an annual well check. Dental exams once or twice a year. Routine eye exams. Ask your health care provider how often you should have your eyes checked. Personal lifestyle choices, including: Daily care of your teeth and gums. Regular physical activity. Eating a healthy diet. Avoiding tobacco and drug use. Limiting alcohol use. Practicing safe sex. Taking low-dose aspirin every day. Taking vitamin and mineral supplements as recommended by your health care provider. What happens during an annual well check? The services and screenings done by your health care provider during your annual well check will depend on your age, overall health, lifestyle risk factors, and family history of disease. Counseling  Your health care provider may ask you questions about your: Alcohol use. Tobacco use. Drug use. Emotional well-being. Home and relationship well-being. Sexual activity. Eating  habits. History of falls. Memory and ability to understand (cognition). Work and work Statistician. Reproductive health. Screening  You may have the following tests or measurements: Height, weight, and BMI. Blood pressure. Lipid and cholesterol levels. These may be checked every 5 years, or more frequently if you are over 80 years old. Skin check. Lung cancer screening. You may have this screening every year starting at age 22 if you have a 30-pack-year history of smoking and currently smoke or have quit within the past 15 years. Fecal occult blood test (FOBT) of the stool. You may have this test every year starting at age 44. Flexible sigmoidoscopy or colonoscopy. You may have a sigmoidoscopy every 5 years or a colonoscopy every 10 years starting at age 52. Hepatitis C blood test. Hepatitis B blood test. Sexually transmitted disease (STD) testing. Diabetes screening. This is done by checking your blood sugar (glucose) after you have not eaten for a while (fasting). You may have this done every 1-3 years. Bone density scan. This is done to screen for osteoporosis. You may have this done starting at age 48. Mammogram. This may be done every 1-2 years. Talk to your health care provider about how often you should have regular mammograms. Talk with your health care provider about your test results, treatment options, and if necessary, the need for more tests. Vaccines  Your health care provider may recommend certain vaccines, such as: Influenza vaccine. This is recommended every year. Tetanus, diphtheria, and acellular pertussis (Tdap, Td) vaccine. You may need a Td booster every 10 years. Zoster vaccine. You may need this after age 57. Pneumococcal 13-valent conjugate (PCV13) vaccine. One dose is recommended after age 31. Pneumococcal polysaccharide (PPSV23)  vaccine. One dose is recommended after age 95. Talk to your health care provider about which screenings and vaccines you need and how  often you need them. This information is not intended to replace advice given to you by your health care provider. Make sure you discuss any questions you have with your health care provider. Document Released: 04/14/2015 Document Revised: 12/06/2015 Document Reviewed: 01/17/2015 Elsevier Interactive Patient Education  2017 [employer] Prevention in the Home Falls can cause injuries. They can happen to people of all ages. There are many things you can do to make your home safe and to help prevent falls. What can I do on the outside of my home? Regularly fix the edges of walkways and driveways and fix any cracks. Remove anything that might make you trip as you walk through a door, such as a raised step or threshold. Trim any bushes or trees on the path to your home. Use bright outdoor lighting. Clear any walking paths of anything that might make someone trip, such as rocks or tools. Regularly check to see if handrails are loose or broken. Make sure that both sides of any steps have handrails. Any raised decks and porches should have guardrails on the edges. Have any leaves, snow, or ice cleared regularly. Use sand or salt on walking paths during winter. Clean up any spills in your garage right away. This includes oil or grease spills. What can I do in the bathroom? Use night lights. Install grab bars by the toilet and in the tub and shower. Do not use towel bars as grab bars. Use non-skid mats or decals in the tub or shower. If you need to sit down in the shower, use a plastic, non-slip stool. Keep the floor dry. Clean up any water that spills on the floor as soon as it happens. Remove soap buildup in the tub or shower regularly. Attach bath mats securely with double-sided non-slip rug tape. Do not have throw rugs and other things on the floor that can make you trip. What can I do in the bedroom? Use night lights. Make sure that you have a light by your bed that is easy to  reach. Do not use any sheets or blankets that are too big for your bed. They should not hang down onto the floor. Have a firm chair that has side arms. You can use this for support while you get dressed. Do not have throw rugs and other things on the floor that can make you trip. What can I do in the kitchen? Clean up any spills right away. Avoid walking on wet floors. Keep items that you use a lot in easy-to-reach places. If you need to reach something above you, use a strong step stool that has a grab bar. Keep electrical cords out of the way. Do not use floor polish or wax that makes floors slippery. If you must use wax, use non-skid floor wax. Do not have throw rugs and other things on the floor that can make you trip. What can I do with my stairs? Do not leave any items on the stairs. Make sure that there are handrails on both sides of the stairs and use them. Fix handrails that are broken or loose. Make sure that handrails are as long as the stairways. Check any carpeting to make sure that it is firmly attached to the stairs. Fix any carpet that is loose or worn. Avoid having throw rugs at the top or bottom  of the stairs. If you do have throw rugs, attach them to the floor with carpet tape. Make sure that you have a light switch at the top of the stairs and the bottom of the stairs. If you do not have them, ask someone to add them for you. What else can I do to help prevent falls? Wear shoes that: Do not have high heels. Have rubber bottoms. Are comfortable and fit you well. Are closed at the toe. Do not wear sandals. If you use a stepladder: Make sure that it is fully opened. Do not climb a closed stepladder. Make sure that both sides of the stepladder are locked into place. Ask someone to hold it for you, if possible. Clearly mark and make sure that you can see: Any grab bars or handrails. First and last steps. Where the edge of each step is. Use tools that help you move  around (mobility aids) if they are needed. These include: Canes. Walkers. Scooters. Crutches. Turn on the lights when you go into a dark area. Replace any light bulbs as soon as they burn out. Set up your furniture so you have a clear path. Avoid moving your furniture around. If any of your floors are uneven, fix them. If there are any pets around you, be aware of where they are. Review your medicines with your doctor. Some medicines can make you feel dizzy. This can increase your chance of falling. Ask your doctor what other things that you can do to help prevent falls. This information is not intended to replace advice given to you by your health care provider. Make sure you discuss any questions you have with your health care provider. Document Released: 01/12/2009 Document Revised: 08/24/2015 Document Reviewed: 04/22/2014 Elsevier Interactive Patient Education  2017 [employer].

## 2021-01-17 ENCOUNTER — Ambulatory Visit: Payer: Medicare Other | Admitting: Family

## 2021-01-17 ENCOUNTER — Encounter: Payer: Self-pay | Admitting: Family

## 2021-01-17 ENCOUNTER — Other Ambulatory Visit: Payer: Self-pay

## 2021-01-17 VITALS — BP 118/66 | HR 75 | Temp 97.1°F | Ht 60.0 in | Wt 166.6 lb

## 2021-01-17 DIAGNOSIS — J0101 Acute recurrent maxillary sinusitis: Secondary | ICD-10-CM

## 2021-01-17 MED ORDER — DOXYCYCLINE HYCLATE 100 MG PO TABS: 100.0000 mg | ORAL_TABLET | Freq: Two times a day (BID) | ORAL | 0 refills | Status: AC

## 2021-01-17 NOTE — Patient Instructions (Addendum)
Flush sinus with a Neti pot once daily   - Increase water intake / soup or warm tea   - Take Tylenol 500 mg tablet one by mouth every 8 hrs as needed for pain   - Take loratadine 10 mg tablet one by daily x 14 days    Sinusitis, Adult Sinusitis is inflammation of your sinuses. Sinuses are hollow spaces in the bones around your face. Your sinuses are located: Around your eyes. In the middle of your forehead. Behind your nose. In your cheekbones. Mucus normally drains out of your sinuses. When your nasal tissues become inflamed or swollen, mucus can become trapped or blocked. This allows bacteria, viruses, and fungi to grow, which leads to infection. Most infections of the sinuses are caused by a virus. Sinusitis can develop quickly. It can last for up to 4 weeks (acute) or for more than 12 weeks (chronic). Sinusitis often develops after a cold. What are the causes? This condition is caused by anything that creates swelling in the sinuses or stops mucus from draining. This includes: Allergies. Asthma. Infection from bacteria or viruses. Deformities or blockages in your nose or sinuses. Abnormal growths in the nose (nasal polyps). Pollutants, such as chemicals or irritants in the air. Infection from fungi (rare). What increases the risk? You are more likely to develop this condition if you: Have a weak body defense system (immune system). Do a lot of swimming or diving. Overuse nasal sprays. Smoke. What are the signs or symptoms? The main symptoms of this condition are pain and a feeling of pressure around the affected sinuses. Other symptoms include: Stuffy nose or congestion. Thick drainage from your nose. Swelling and warmth over the affected sinuses. Headache. Upper toothache. A cough that may get worse at night. Extra mucus that collects in the throat or the back of the nose (postnasal drip). Decreased sense of smell and taste. Fatigue. A fever. Sore throat. Bad  breath. How is this diagnosed? This condition is diagnosed based on: Your symptoms. Your medical history. A physical exam. Tests to find out if your condition is acute or chronic. This may include: Checking your nose for nasal polyps. Viewing your sinuses using a device that has a light (endoscope). Testing for allergies or bacteria. Imaging tests, such as an MRI or CT scan. In rare cases, a bone biopsy may be done to rule out more serious types of fungal sinus disease. How is this treated? Treatment for sinusitis depends on the cause and whether your condition is chronic or acute. If caused by a virus, your symptoms should go away on their own within 10 days. You may be given medicines to relieve symptoms. They include: Medicines that shrink swollen nasal passages (topical intranasal decongestants). Medicines that treat allergies (antihistamines). A spray that eases inflammation of the nostrils (topical intranasal corticosteroids). Rinses that help get rid of thick mucus in your nose (nasal saline washes). If caused by bacteria, your health care provider may recommend waiting to see if your symptoms improve. Most bacterial infections will get better without antibiotic medicine. You may be given antibiotics if you have: A severe infection. A weak immune system. If caused by narrow nasal passages or nasal polyps, you may need to have surgery. Follow these instructions at home: Medicines Take, use, or apply over-the-counter and prescription medicines only as told by your health care provider. These may include nasal sprays. If you were prescribed an antibiotic medicine, take it as told by your health care provider. Do not  stop taking the antibiotic even if you start to feel better. Hydrate and humidify  Drink enough fluid to keep your urine pale yellow. Staying hydrated will help to thin your mucus. Use a cool mist humidifier to keep the humidity level in your home above 50%. Inhale  steam for 10-15 minutes, 3-4 times a day, or as told by your health care provider. You can do this in the bathroom while a hot shower is running. Limit your exposure to cool or dry air. Rest Rest as much as possible. Sleep with your head raised (elevated). Make sure you get enough sleep each night. General instructions  Apply a warm, moist washcloth to your face 3-4 times a day or as told by your health care provider. This will help with discomfort. Wash your hands often with soap and water to reduce your exposure to germs. If soap and water are not available, use hand sanitizer. Do not smoke. Avoid being around people who are smoking (secondhand smoke). Keep all follow-up visits as told by your health care provider. This is important. Contact a health care provider if: You have a fever. Your symptoms get worse. Your symptoms do not improve within 10 days. Get help right away if: You have a severe headache. You have persistent vomiting. You have severe pain or swelling around your face or eyes. You have vision problems. You develop confusion. Your neck is stiff. You have trouble breathing. Summary Sinusitis is soreness and inflammation of your sinuses. Sinuses are hollow spaces in the bones around your face. This condition is caused by nasal tissues that become inflamed or swollen. The swelling traps or blocks the flow of mucus. This allows bacteria, viruses, and fungi to grow, which leads to infection. If you were prescribed an antibiotic medicine, take it as told by your health care provider. Do not stop taking the antibiotic even if you start to feel better. Keep all follow-up visits as told by your health care provider. This is important. This information is not intended to replace advice given to you by your health care provider. Make sure you discuss any questions you have with your health care provider. Document Revised: 08/18/2017 Document Reviewed: 08/18/2017 Elsevier Patient  Education  2022 [employer].

## 2021-01-17 NOTE — Progress Notes (Signed)
Provider: Marlowe Sax FNP-C  Jameia Makris, [name], NP  Patient Care Team: Tu Bayle, [name], NP as PCP - General (Family Medicine) Burnell Blanks, MD as PCP - Cardiology (Cardiology) Bjorn Loser, MD (Urology) Particia Nearing, MD (Dermatology) Luberta Mutter, MD (Ophthalmology) Almedia Balls, MD (Orthopedic Surgery) Ouida Sills  (Rheumatology) Jarome Matin, MD as Consulting Physician (Dermatology) Gatha Mayer, MD as Consulting Physician (Gastroenterology) Rozetta Nunnery, MD as Consulting Physician (Otolaryngology)  Extended Emergency Contact Information Primary Emergency Contact: Apple,Karen Address: [address]          [place] [address] [name] of [place] Phone: [phone] Work Phone: [phone] Mobile Phone: [phone] Relation: Daughter  Code Status:  DNR Goals of care: Advanced Directive information Advanced Directives 12/13/2020  Does Patient Have a Medical Advance Directive? Yes  Type of Paramedic of [place];Living will  Does patient want to make changes to medical advance directive? No - Patient declined  Copy of [place] in Chart? Yes - validated most recent copy scanned in chart (See row information)  Would patient like information on creating a medical advance directive? -  Pre-existing out of facility DNR order (yellow form or pink MOST form) -     Chief Complaint  Patient presents with   Acute Visit    Possible sinus infection: sinus pressure, cough (productive-yellow at times), drainage down throat, dry mouth, oral pain on right side of mouth, and ear fullness.. At home covid test completed today 01/17/21 and was negative.     HPI:  Pt is a 87 y.o. female seen today for an acute visit for evaluation of sinus pressure,cough  x 3 weeks.states has pain on right face. Drainage goes down her throat.Has take mucinex,and Claritin.throat feels sore.coughs yellow mucus.   No  fever or chills. COVID-19 test was negative.    Past Medical History:  Diagnosis Date   Abdominal pain 12/2009   hospitalized   Allergic rhinitis    Anxiety    Bladder problem    Chronic constipation    Compression fx, lumbar spine (HCC)    DDD (degenerative disc disease), lumbar    Diverticulosis of colon    Dog bite(E906.0) 08/30/2011   Dysrhythmia    GERD (gastroesophageal reflux disease)    Hepatic steatosis    pt denies   Hyperlipidemia    Hyperplastic colon polyp    IBS (irritable bowel syndrome)    constipation predominant   Internal hemorrhoids    Osteoporosis    dexa 2011 -2.7 hip nl spine   PAF (paroxysmal atrial fibrillation) ([place])    a. dx 05/2017.   PMR (polymyalgia rheumatica) ([place]) 08/30/2011   under rheum care  on low dose pred 5 mg    Renal disorder    Past Surgical History:  Procedure Laterality Date   ABDOMINAL HYSTERECTOMY     with bladder tact and rectocele repair   BLADDER SURGERY     Streched   CATARACT EXTRACTION  2010   both   COLONOSCOPY  2016 was most recent   CYSTO WITH HYDRODISTENSION N/A 10/05/2019   Procedure: CYSTOSCOPY/HYDRODISTENSION;  Surgeon: Bjorn Loser, MD;  Location: [facility];  Service: Urology;  Laterality: N/A;   TONSILLECTOMY AND ADENOIDECTOMY      Allergies  Allergen Reactions   Alendronate Sodium     upset stomach   Penicillins     Has patient had a PCN reaction causing immediate rash, facial/tongue/throat swelling, SOB or lightheadedness with hypotension: Yes Has patient  had a PCN reaction causing severe rash involving mucus membranes or skin necrosis: No Has patient had a PCN reaction that required hospitalization: No Has patient had a PCN reaction occurring within the last 10 years: No If all of the above answers are "NO", then may proceed with Cephalosporin use.    Amoxicillin Rash    Has patient had a PCN reaction causing immediate rash, facial/tongue/throat swelling, SOB or lightheadedness with hypotension:  yes Has patient had a PCN reaction causing severe rash involving mucus membranes or skin necrosis: no Has patient had a PCN reaction that required hospitalization: no Has patient had PCN reaction within the last 10 years: no  If all of the above answers are "NO", then may proceed with Cephalosporin use.  REACTION: unspecified   Metronidazole Other (See Comments)    Pounding headaches patient says was bad.  With cipro for diverticulitis rx. Other reaction(s): Other (See Comments) Pounding headaches patient says was bad.  With cipro for diverticulitis rx.    Outpatient Encounter Medications as of 01/17/2021  Medication Sig   acetaminophen (TYLENOL) 500 MG tablet Take 500 mg by mouth at bedtime as needed.   Ascorbic Acid (VITAMIN C) 100 MG tablet Take 500 mg by mouth daily.   calcium carbonate (OS-CAL) 1250 (500 Ca) MG chewable tablet Chew 1 tablet by mouth daily.    diltiazem (CARDIZEM CD) 120 MG 24 hr capsule TAKE 1 CAPSULE BY MOUTH EVERY DAY   ELIQUIS 5 MG TABS tablet TAKE 1 TABLET BY MOUTH TWICE A DAY   escitalopram (LEXAPRO) 5 MG tablet Take one tablet by mouth once daily at bedtime.   guaiFENesin (MUCINEX) 600 MG 12 hr tablet Take 600 mg by mouth daily as needed.   loratadine (CLARITIN) 10 MG tablet Take 10 mg by mouth daily as needed.   magnesium hydroxide (MILK OF MAGNESIA) 400 MG/5ML suspension Take 15 mLs by mouth daily as needed for mild constipation.   Multiple Vitamin (MULTIVITAMIN) tablet Take 1 tablet by mouth daily.   polyethylene glycol powder (GLYCOLAX/MIRALAX) 17 GM/SCOOP powder Take 17 g by mouth daily. Hold for loose stool   PREMARIN vaginal cream INSERT 1 GRAM VAGINALLY THREE TIMES A WEEKLY, THEN ONCE WEEKLY   Probiotic Product (ALIGN) 4 MG CAPS Take 1 capsule by mouth as needed.   RESTASIS 0.05 % ophthalmic emulsion Place 1 drop into both eyes 2 (two) times daily.   triamcinolone (NASACORT) 55 MCG/ACT AERO nasal inhaler 2 SPRAYS INTO EACH NOSTRIL AT NIGHT   No  facility-administered encounter medications on file as of 01/17/2021.    Review of Systems  Constitutional:  Negative for appetite change, chills, fatigue, fever and unexpected weight change.  HENT:  Positive for congestion, postnasal drip, rhinorrhea, sinus pressure, sinus pain and sore throat. Negative for dental problem, ear discharge, ear pain, facial swelling, hearing loss, nosebleeds, sneezing, tinnitus and trouble swallowing.   Eyes:  Negative for pain, discharge, redness, itching and visual disturbance.  Respiratory:  Negative for cough, chest tightness, shortness of breath and wheezing.   Cardiovascular:  Negative for chest pain, palpitations and leg swelling.  Gastrointestinal:  Negative for abdominal distention, abdominal pain, constipation, diarrhea, nausea and vomiting.  Skin:  Negative for color change, pallor and rash.  Neurological:  Negative for dizziness, weakness, light-headedness, numbness and headaches.  Psychiatric/Behavioral:  Negative for agitation, behavioral problems, confusion and sleep disturbance. The patient is not nervous/anxious.    Immunization History  Administered Date(s) Administered   Fluad Quad(high Dose 65+) 01/10/2020  Influenza Split 01/25/2011, 01/23/2012   Influenza Whole 12/31/2007   Influenza, High Dose Seasonal PF 01/19/2013, 01/10/2014, 01/17/2015, 01/18/2016, 01/02/2017, 01/05/2018, 01/05/2019, 01/05/2019   Influenza-Unspecified 01/02/2017   PFIZER(Purple Top)SARS-COV-2 Vaccination 05/07/2019, 06/02/2019, 01/31/2020, 08/02/2020   Pneumococcal Conjugate-13 02/23/2013   Pneumococcal Polysaccharide-23 04/01/2006, 04/01/2018   Td 04/01/2005   Td (Adult) 11/22/2016   Tdap 08/30/2011   Zoster Recombinat (Shingrix) 12/04/2016, 07/27/2017   Zoster, Live 04/02/2007   Pertinent  Health Maintenance Due  Topic Date Due   INFLUENZA VACCINE  10/30/2020   MAMMOGRAM  09/09/2022   DEXA SCAN  Completed   Fall Risk  12/13/2020 11/10/2020 11/09/2020  10/11/2020 07/10/2020  Falls in the past year? 0 0 0 0 0  Number falls in past yr: 0 0 0 0 0  Injury with Fall? 0 0 0 0 0  Comment - - - - -  Risk for fall due to : No Fall Risks History of fall(s) No Fall Risks No Fall Risks -  Follow up Falls evaluation completed Falls evaluation completed;Education provided;Falls prevention discussed Falls evaluation completed Falls evaluation completed -   Functional Status Survey:    Vitals:   01/17/21 1320  BP: 118/66  Pulse: 75  Temp: (!) 97.1 F (36.2 C)  SpO2: 98%  Weight: 166 lb 9.6 oz (75.6 kg)  Height: 5' (1.524 m)   Body mass index is 32.54 kg/m. Physical Exam Vitals reviewed.  Constitutional:      General: She is not in acute distress.    Appearance: Normal appearance. She is normal weight. She is not ill-appearing or diaphoretic.  HENT:     Head: Normocephalic.     Right Ear: Tympanic membrane, ear canal and external ear normal. There is no impacted cerumen.     Left Ear: Tympanic membrane, ear canal and external ear normal. There is no impacted cerumen.     Nose: No congestion or rhinorrhea.     Right Sinus: Maxillary sinus tenderness present.     Left Sinus: Maxillary sinus tenderness present.     Mouth/Throat:     Mouth: Mucous membranes are moist.     Pharynx: Oropharynx is clear. Posterior oropharyngeal erythema present. No oropharyngeal exudate.  Eyes:     General: No scleral icterus.       Right eye: No discharge.        Left eye: No discharge.     Extraocular Movements: Extraocular movements intact.     Conjunctiva/sclera: Conjunctivae normal.     Pupils: Pupils are equal, round, and reactive to light.  Neck:     Vascular: No carotid bruit.  Cardiovascular:     Rate and Rhythm: Normal rate and regular rhythm.     Pulses: Normal pulses.     Heart sounds: Normal heart sounds. No murmur heard.   No friction rub. No gallop.  Pulmonary:     Effort: Pulmonary effort is normal. No respiratory distress.     Breath  sounds: Normal breath sounds. No wheezing, rhonchi or rales.  Chest:     Chest wall: No tenderness.  Abdominal:     General: Bowel sounds are normal. There is no distension.     Palpations: Abdomen is soft. There is no mass.     Tenderness: There is no abdominal tenderness. There is no right CVA tenderness, left CVA tenderness, guarding or rebound.  Musculoskeletal:        General: No swelling or tenderness. Normal range of motion.     Cervical back: Normal  range of motion. No rigidity or tenderness.     Right lower leg: No edema.     Left lower leg: No edema.  Lymphadenopathy:     Cervical: No cervical adenopathy.  Skin:    General: Skin is warm and dry.     Coloration: Skin is not pale.     Findings: No bruising, erythema or rash.  Neurological:     Mental Status: She is alert and oriented to person, place, and time.     Motor: No weakness.     Gait: Gait normal.  Psychiatric:        Mood and Affect: Mood normal.        Speech: Speech normal.        Behavior: Behavior normal.        Thought Content: Thought content normal.        Judgment: Judgment normal.    Labs reviewed: Recent Labs    06/30/20 0000 11/03/20 0931  NA 136 135  K 4.3 4.3  CL 102 100  CO2 23 26  GLUCOSE 85 82  BUN 9 9  CREATININE 0.74 0.79  CALCIUM 9.0 9.0   Recent Labs    06/30/20 0000 11/03/20 0931  AST 21 16  ALT 14 13  BILITOT 0.9 0.7  PROT 6.7 6.7   Recent Labs    06/30/20 0000 10/11/20 1536 11/03/20 0931  WBC 4.4 7.3 5.0  NEUTROABS 2,521 4,555 3,005  HGB 12.4 13.5 13.3  HCT 37.1 40.5 39.7  MCV 95.1 92.9 94.7  PLT 240 258 245   Lab Results  Component Value Date   TSH 5.36 (H) 11/03/2020   No results found for: HGBA1C Lab Results  Component Value Date   CHOL 178 11/03/2020   HDL 66 11/03/2020   LDLCALC 90 11/03/2020   LDLDIRECT 100.0 04/07/2019   TRIG 122 11/03/2020   CHOLHDL 2.7 11/03/2020    Significant Diagnostic Results in last 30 days:  No results  found.  Assessment/Plan  Acute recurrent maxillary sinusitis Maxillary tenderness on percussion worst on right maxillary. Advised to Flush sinus with a Neti pot once daily or put warm wet wash cloth for relief.  - Increase water intake / soup or warm tea  - Take Tylenol 500 mg tablet one by mouth every 8 hrs as needed for pain  - Take loratadine 10 mg tablet one by daily x 14 days   - start on Doxycycline as below tends to get sinusitis during fall season. - doxycycline (VIBRA-TABS) 100 MG tablet; Take 1 tablet (100 mg total) by mouth 2 (two) times daily for 7 days.  Dispense: 14 tablet; Refill: 0 - advised to notify provider or go to ED if symptoms worsen or fail to improve  Family/ staff Communication: Reviewed plan of care with patient verbalized understanding   Labs/tests ordered: None   Next Appointment: As needed if symptoms worsen or fail to improve    Sandrea Hughs, NP

## 2021-02-14 ENCOUNTER — Other Ambulatory Visit: Payer: Self-pay

## 2021-02-14 ENCOUNTER — Ambulatory Visit: Payer: Medicare Other | Admitting: Adult Health

## 2021-02-14 ENCOUNTER — Encounter: Payer: Self-pay | Admitting: Adult Health

## 2021-02-14 VITALS — BP 110/62 | HR 78 | Temp 97.3°F | Ht 60.0 in | Wt 165.0 lb

## 2021-02-14 DIAGNOSIS — K581 Irritable bowel syndrome with constipation: Secondary | ICD-10-CM | POA: Diagnosis not present

## 2021-02-14 MED ORDER — LINACLOTIDE 72 MCG PO CAPS: 72.0000 ug | ORAL_CAPSULE | Freq: Every day | ORAL | 0 refills | Status: DC

## 2021-02-14 NOTE — Progress Notes (Signed)
Location:  [facility]   Place of Service:   clinic   CODE STATUS: dnr   Allergies  Allergen Reactions   Alendronate Sodium     upset stomach   Penicillins     Has patient had a PCN reaction causing immediate rash, facial/tongue/throat swelling, SOB or lightheadedness with hypotension: Yes Has patient had a PCN reaction causing severe rash involving mucus membranes or skin necrosis: No Has patient had a PCN reaction that required hospitalization: No Has patient had a PCN reaction occurring within the last 10 years: No If all of the above answers are "NO", then may proceed with Cephalosporin use.    Amoxicillin Rash    Has patient had a PCN reaction causing immediate rash, facial/tongue/throat swelling, SOB or lightheadedness with hypotension: yes Has patient had a PCN reaction causing severe rash involving mucus membranes or skin necrosis: no Has patient had a PCN reaction that required hospitalization: no Has patient had PCN reaction within the last 10 years: no  If all of the above answers are "NO", then may proceed with Cephalosporin use.  REACTION: unspecified   Metronidazole Other (See Comments)    Pounding headaches patient says was bad.  With cipro for diverticulitis rx. Other reaction(s): Other (See Comments) Pounding headaches patient says was bad.  With cipro for diverticulitis rx.    Chief Complaint  Patient presents with   Acute Visit    Patient complains of constipation. Patient took milk of magnesia last night that seemed to help. Patient has used miralax that doesn't help. Patient would like something she could take at night. Patient has always dealt with constipation problems.    HPI:  She has chronic constipation. She is presently taking miralax "most days" metamucil "most days" without relief. She will take MOM  as needed results. She denies any nausea or vomiting. She did take MOM last night. We have discussed the use of MOM and the risks  involved. We have discussed metamucil and the need for increased fluid intake with this medication. She did verbalize understanding.   Past Medical History:  Diagnosis Date   Abdominal pain 12/2009   hospitalized   Allergic rhinitis    Anxiety    Bladder problem    Chronic constipation    Compression fx, lumbar spine (HCC)    DDD (degenerative disc disease), lumbar    Diverticulosis of colon    Dog bite(E906.0) 08/30/2011   Dysrhythmia    GERD (gastroesophageal reflux disease)    Hepatic steatosis    pt denies   Hyperlipidemia    Hyperplastic colon polyp    IBS (irritable bowel syndrome)    constipation predominant   Internal hemorrhoids    Osteoporosis    dexa 2011 -2.7 hip nl spine   PAF (paroxysmal atrial fibrillation) ([place])    a. dx 05/2017.   PMR (polymyalgia rheumatica) ([place]) 08/30/2011   under rheum care  on low dose pred 5 mg    Renal disorder     Past Surgical History:  Procedure Laterality Date   ABDOMINAL HYSTERECTOMY     with bladder tact and rectocele repair   BLADDER SURGERY     Streched   CATARACT EXTRACTION  2010   both   COLONOSCOPY  2016 was most recent   CYSTO WITH HYDRODISTENSION N/A 10/05/2019   Procedure: CYSTOSCOPY/HYDRODISTENSION;  Surgeon: Bjorn Loser, MD;  Location: [facility];  Service: Urology;  Laterality: N/A;   TONSILLECTOMY AND ADENOIDECTOMY      Social  History   Socioeconomic History   Marital status: Widowed    Spouse name: Not on file   Number of children: Not on file   Years of education: Not on file   Highest education level: Not on file  Occupational History   Occupation: retired  Tobacco Use   Smoking status: Never   Smokeless tobacco: Never  Vaping Use   Vaping Use: Never used  Substance and Sexual Activity   Alcohol use: Yes    Alcohol/week: 0.0 standard drinks    Comment: occ   Drug use: Never   Sexual activity: Not Currently  Other Topics Concern   Not on file  Social History Narrative   Retired   Regular  exercise- yes   Widowed   At home with children and GKs    HH of 2     Puppy   Plays cards is social and active       Diet:  No      Do you drink/ eat things with caffeine? Yes      Marital status:   Widow                            What year were you married ? 1914,7829      Do you live in a house, apartment,assistred living, condo, trailer, etc.)? Townhouse      Is it one or more stories? 2      How many persons live in your home ? 2      Do you have any pets in your home ?(please list)  None      Highest Level of education completed:  12 th Grade      Current or past profession: Retail Sales @ [employer]      Do you exercise?    Some                          Type & how often  Walking      ADVANCED DIRECTIVES (Please bring copies)      Do you have a living will? Yes      Do you have a DNR form?  Yes                     If not, do you want to discuss one?       Do you have signed POA?HPOA forms? Yes                If so, please bring to your appointment      FUNCTIONAL STATUS- To be completed by Spouse / child / Staff       Do you have difficulty bathing or dressing yourself ? No      Do you have difficulty preparing food or eating ?  No      Do you have difficulty managing your mediation ? No      Do you have difficulty managing your finances ? No      Do you have difficulty affording your medication ? No      Social Determinants of Radio broadcast assistant Strain: Not on file  Food Insecurity: Not on file  Transportation Needs: Not on file  Physical Activity: Not on file  Stress: Not on file  Social Connections: Not on file  Intimate Partner Violence: Not on file   Family History  Problem Relation  Age of Onset   Stroke Mother 48   Liver disease Brother 4   Heart attack Father 8   Alzheimer's disease Sister 23   Bladder Cancer Sister 85   Crohn's disease Son    Colon cancer Neg Hx    Esophageal cancer Neg Hx    Stomach cancer Neg Hx    Rectal  cancer Neg Hx    Liver cancer Neg Hx       VITAL SIGNS BP 110/62   Pulse 78   Temp (!) 97.3 F (36.3 C)   Ht 5' (1.524 m)   Wt 165 lb (74.8 kg)   SpO2 98%   BMI 32.22 kg/m   Outpatient Encounter Medications as of 02/14/2021  Medication Sig   acetaminophen (TYLENOL) 500 MG tablet Take 500 mg by mouth at bedtime as needed.   Ascorbic Acid (VITAMIN C) 100 MG tablet Take 500 mg by mouth daily.   calcium carbonate (OS-CAL) 1250 (500 Ca) MG chewable tablet Chew 1 tablet by mouth daily.    diltiazem (CARDIZEM CD) 120 MG 24 hr capsule TAKE 1 CAPSULE BY MOUTH EVERY DAY   ELIQUIS 5 MG TABS tablet TAKE 1 TABLET BY MOUTH TWICE A DAY   escitalopram (LEXAPRO) 5 MG tablet Take one tablet by mouth once daily at bedtime.   guaiFENesin (MUCINEX) 600 MG 12 hr tablet Take 600 mg by mouth daily as needed.   loratadine (CLARITIN) 10 MG tablet Take 10 mg by mouth daily as needed.   magnesium hydroxide (MILK OF MAGNESIA) 400 MG/5ML suspension Take 15 mLs by mouth daily as needed for mild constipation.   Multiple Vitamin (MULTIVITAMIN) tablet Take 1 tablet by mouth daily.   polyethylene glycol powder (GLYCOLAX/MIRALAX) 17 GM/SCOOP powder Take 17 g by mouth daily. Hold for loose stool   PREMARIN vaginal cream INSERT 1 GRAM VAGINALLY THREE TIMES A WEEKLY, THEN ONCE WEEKLY   Probiotic Product (ALIGN) 4 MG CAPS Take 1 capsule by mouth as needed.   RESTASIS 0.05 % ophthalmic emulsion Place 1 drop into both eyes 2 (two) times daily.   triamcinolone (NASACORT) 55 MCG/ACT AERO nasal inhaler 2 SPRAYS INTO EACH NOSTRIL AT NIGHT   No facility-administered encounter medications on file as of 02/14/2021.     SIGNIFICANT DIAGNOSTIC EXAMS  Review of Systems  Constitutional:  Negative for malaise/fatigue.  Respiratory:  Negative for cough and shortness of breath.   Cardiovascular:  Negative for chest pain, palpitations and leg swelling.  Gastrointestinal:  Positive for constipation. Negative for abdominal pain,  diarrhea, heartburn and nausea.  Musculoskeletal:  Negative for back pain, joint pain and myalgias.  Skin: Negative.   Neurological:  Negative for dizziness.  Psychiatric/Behavioral:  The patient is not nervous/anxious.     Physical Exam Constitutional:      General: She is not in acute distress.    Appearance: She is well-developed. She is not diaphoretic.  Neck:     Thyroid: No thyromegaly.  Cardiovascular:     Rate and Rhythm: Normal rate and regular rhythm.     Heart sounds: Normal heart sounds.  Pulmonary:     Effort: Pulmonary effort is normal. No respiratory distress.     Breath sounds: Normal breath sounds.  Abdominal:     General: Bowel sounds are normal. There is no distension.     Palpations: Abdomen is soft.     Tenderness: There is no abdominal tenderness.  Musculoskeletal:        General: Normal range of motion.  Cervical back: Neck supple.     Right lower leg: No edema.     Left lower leg: No edema.  Lymphadenopathy:     Cervical: No cervical adenopathy.  Skin:    General: Skin is warm and dry.  Neurological:     Mental Status: She is alert and oriented to person, place, and time.  Psychiatric:        Mood and Affect: Mood normal.      ASSESSMENT/ PLAN:  TODAY  IBS with constipation: is worse: will stop metamucil due to risk of constipation without adequate fluid intake; will stop MOM due to electrolyte imbalances.  will continue miralax daily will begin linzess 72 mcg daily prior to breakfast prescription has been sent to [place] [facility]  Contact [phone] Monday through Friday 8am- 5pm  After hours call [phone]

## 2021-03-02 ENCOUNTER — Ambulatory Visit
Admission: RE | Admit: 2021-03-02 | Discharge: 2021-03-02 | Disposition: A | Discharge status: Home / Self Care | Payer: Medicare Other | Source: Ambulatory Visit | Attending: Orthopedic Surgery | Admitting: Orthopedic Surgery

## 2021-03-02 ENCOUNTER — Ambulatory Visit: Payer: Medicare Other | Admitting: Orthopedic Surgery

## 2021-03-02 ENCOUNTER — Encounter: Payer: Self-pay | Admitting: Orthopedic Surgery

## 2021-03-02 ENCOUNTER — Other Ambulatory Visit: Payer: Self-pay

## 2021-03-02 VITALS — BP 122/80 | HR 69 | Temp 97.5°F | Ht 60.0 in | Wt 164.8 lb

## 2021-03-02 DIAGNOSIS — M25552 Pain in left hip: Secondary | ICD-10-CM

## 2021-03-02 DIAGNOSIS — R269 Unspecified abnormalities of gait and mobility: Secondary | ICD-10-CM | POA: Diagnosis not present

## 2021-03-02 NOTE — Progress Notes (Signed)
Careteam: Patient Care Team: Ngetich, [name], NP as PCP - General (Family Medicine) Burnell Blanks, MD as PCP - Cardiology (Cardiology) Bjorn Loser, MD (Urology) Particia Nearing, MD (Dermatology) Luberta Mutter, MD (Ophthalmology) Almedia Balls, MD (Orthopedic Surgery) Ouida Sills  (Rheumatology) Jarome Matin, MD as Consulting Physician (Dermatology) Gatha Mayer, MD as Consulting Physician (Gastroenterology) Rozetta Nunnery, MD as Consulting Physician (Otolaryngology)  Seen by: Windell Moulding, AGNP-C  PLACE OF SERVICE:  [place]  Advanced Directive information    Allergies  Allergen Reactions   Alendronate Sodium     upset stomach   Penicillins     Has patient had a PCN reaction causing immediate rash, facial/tongue/throat swelling, SOB or lightheadedness with hypotension: Yes Has patient had a PCN reaction causing severe rash involving mucus membranes or skin necrosis: No Has patient had a PCN reaction that required hospitalization: No Has patient had a PCN reaction occurring within the last 10 years: No If all of the above answers are "NO", then may proceed with Cephalosporin use.    Amoxicillin Rash    Has patient had a PCN reaction causing immediate rash, facial/tongue/throat swelling, SOB or lightheadedness with hypotension: yes Has patient had a PCN reaction causing severe rash involving mucus membranes or skin necrosis: no Has patient had a PCN reaction that required hospitalization: no Has patient had PCN reaction within the last 10 years: no  If all of the above answers are "NO", then may proceed with Cephalosporin use.  REACTION: unspecified   Metronidazole Other (See Comments)    Pounding headaches patient says was bad.  With cipro for diverticulitis rx. Other reaction(s): Other (See Comments) Pounding headaches patient says was bad.  With cipro for diverticulitis rx.    Chief Complaint  Patient presents with   Acute Visit     Patient having trouble walking. Thinks she may have pulled a muscle while getting up from chair. Pain in lower left abdominal area. When she lifts her leg it really. Pain not so bad when sitting, but when walking it is about an 8. Patient has been using heating pad and icy hot.     HPI: Patient is a 87 y.o. female seen today for acute visit due to left hip pain.   Increased left hip pain began a few days ago. Pain rated 8/10. Described as sharp and stabbing, radiates to groin. She is unable to lift her leg due to pain. She has tried using icy/hot and heating pad without success. Gait abnormal in office today. Denies recent fall or injury.Denies back pain. Daughter reports she is furniture grabbing. Falls safety precautions discussed, advised to use walker at home.   Patient of [facility] in the past. Seen by hand specialist for trigger finger.   Review of Systems:  Review of Systems  Constitutional:  Negative for chills, fever, malaise/fatigue and weight loss.  HENT: Negative.    Respiratory:  Negative for cough, shortness of breath and wheezing.   Cardiovascular:  Negative for chest pain and leg swelling.  Musculoskeletal:  Positive for joint pain. Negative for back pain and falls.  Skin: Negative.   Neurological:  Negative for dizziness and weakness.  Psychiatric/Behavioral:  Negative for depression. The patient is not nervous/anxious.    Past Medical History:  Diagnosis Date   Abdominal pain 12/2009   hospitalized   Allergic rhinitis    Anxiety    Bladder problem    Chronic constipation    Compression fx, lumbar spine (  [place])    DDD (degenerative disc disease), lumbar    Diverticulosis of colon    Dog bite(E906.0) 08/30/2011   Dysrhythmia    GERD (gastroesophageal reflux disease)    Hepatic steatosis    pt denies   Hyperlipidemia    Hyperplastic colon polyp    IBS (irritable bowel syndrome)    constipation predominant   Internal hemorrhoids    Osteoporosis    dexa 2011  -2.7 hip nl spine   PAF (paroxysmal atrial fibrillation) ([place])    a. dx 05/2017.   PMR (polymyalgia rheumatica) ([place]) 08/30/2011   under rheum care  on low dose pred 5 mg    Renal disorder    Past Surgical History:  Procedure Laterality Date   ABDOMINAL HYSTERECTOMY     with bladder tact and rectocele repair   BLADDER SURGERY     Streched   CATARACT EXTRACTION  2010   both   COLONOSCOPY  2016 was most recent   CYSTO WITH HYDRODISTENSION N/A 10/05/2019   Procedure: CYSTOSCOPY/HYDRODISTENSION;  Surgeon: Bjorn Loser, MD;  Location: [facility];  Service: Urology;  Laterality: N/A;   TONSILLECTOMY AND ADENOIDECTOMY     Social History:   reports that she has never smoked. She has never used smokeless tobacco. She reports current alcohol use. She reports that she does not use drugs.  Family History  Problem Relation Age of Onset   Stroke Mother 57   Liver disease Brother 64   Heart attack Father 81   Alzheimer's disease Sister 14   Bladder Cancer Sister 54   Crohn's disease Son    Colon cancer Neg Hx    Esophageal cancer Neg Hx    Stomach cancer Neg Hx    Rectal cancer Neg Hx    Liver cancer Neg Hx     Medications: Patient's Medications  New Prescriptions   No medications on file  Previous Medications   ACETAMINOPHEN (TYLENOL) 500 MG TABLET    Take 500 mg by mouth at bedtime as needed.   ASCORBIC ACID (VITAMIN C) 100 MG TABLET    Take 500 mg by mouth daily.   CALCIUM CARBONATE (OS-CAL) 1250 (500 CA) MG CHEWABLE TABLET    Chew 1 tablet by mouth daily.    DILTIAZEM (CARDIZEM CD) 120 MG 24 HR CAPSULE    TAKE 1 CAPSULE BY MOUTH EVERY DAY   ELIQUIS 5 MG TABS TABLET    TAKE 1 TABLET BY MOUTH TWICE A DAY   ESCITALOPRAM (LEXAPRO) 5 MG TABLET    Take one tablet by mouth once daily at bedtime.   GUAIFENESIN (MUCINEX) 600 MG 12 HR TABLET    Take 600 mg by mouth daily as needed.   LINACLOTIDE (LINZESS) 72 MCG CAPSULE    Take 1 capsule (72 mcg total) by mouth daily before breakfast.    LORATADINE (CLARITIN) 10 MG TABLET    Take 10 mg by mouth daily as needed.   MULTIPLE VITAMIN (MULTIVITAMIN) TABLET    Take 1 tablet by mouth daily.   POLYETHYLENE GLYCOL POWDER (GLYCOLAX/MIRALAX) 17 GM/SCOOP POWDER    Take 17 g by mouth daily. Hold for loose stool   PREMARIN VAGINAL CREAM    INSERT 1 GRAM VAGINALLY THREE TIMES A WEEKLY, THEN ONCE WEEKLY   PROBIOTIC PRODUCT (ALIGN) 4 MG CAPS    Take 1 capsule by mouth as needed.   RESTASIS 0.05 % OPHTHALMIC EMULSION    Place 1 drop into both eyes 2 (two) times daily.   TRIAMCINOLONE (  NASACORT) 70 MCG/ACT AERO NASAL INHALER    2 SPRAYS INTO EACH NOSTRIL AT NIGHT  Modified Medications   No medications on file  Discontinued Medications   No medications on file    Physical Exam:  There were no vitals filed for this visit. There is no height or weight on file to calculate BMI. Wt Readings from Last 3 Encounters:  02/14/21 165 lb (74.8 kg)  01/17/21 166 lb 9.6 oz (75.6 kg)  11/10/20 164 lb 12.8 oz (74.8 kg)    Physical Exam Vitals reviewed.  Constitutional:      General: She is not in acute distress. HENT:     Head: Normocephalic.  Cardiovascular:     Rate and Rhythm: Normal rate. Rhythm irregular.     Pulses: Normal pulses.     Heart sounds: Normal heart sounds. No murmur heard. Pulmonary:     Effort: Pulmonary effort is normal. No respiratory distress.     Breath sounds: Normal breath sounds. No wheezing.  Musculoskeletal:     Left hip: Tenderness present. No deformity or crepitus. Decreased range of motion. Normal strength.     Right lower leg: No edema.     Left lower leg: No edema.     Comments: Exam limited due to pain, unable to sit on exam table.   Skin:    General: Skin is warm and dry.     Capillary Refill: Capillary refill takes less than 2 seconds.  Neurological:     General: No focal deficit present.     Mental Status: She is alert and oriented to person, place, and time.     Gait: Gait abnormal.  Psychiatric:         Mood and Affect: Mood normal.        Behavior: Behavior normal.    Labs reviewed: Basic Metabolic Panel: Recent Labs    06/30/20 0000 11/03/20 0931  NA 136 135  K 4.3 4.3  CL 102 100  CO2 23 26  GLUCOSE 85 82  BUN 9 9  CREATININE 0.74 0.79  CALCIUM 9.0 9.0  TSH  --  5.36*   Liver Function Tests: Recent Labs    06/30/20 0000 11/03/20 0931  AST 21 16  ALT 14 13  BILITOT 0.9 0.7  PROT 6.7 6.7   No results for input(s): LIPASE, AMYLASE in the last 8760 hours. No results for input(s): AMMONIA in the last 8760 hours. CBC: Recent Labs    06/30/20 0000 10/11/20 1536 11/03/20 0931  WBC 4.4 7.3 5.0  NEUTROABS 2,521 4,555 3,005  HGB 12.4 13.5 13.3  HCT 37.1 40.5 39.7  MCV 95.1 92.9 94.7  PLT 240 258 245   Lipid Panel: Recent Labs    06/30/20 0000 11/03/20 0931  CHOL 177 178  HDL 61 66  LDLCALC 93 90  TRIG 130 122  CHOLHDL 2.9 2.7   TSH: Recent Labs    11/03/20 0931  TSH 5.36*   A1C: No results found for: HGBA1C   Assessment/Plan 1. Left hip pain - pain with left leg lift, radiates to groin, gait abnormal - suspect osteoarthritis - recommend tylenol 1000 mg 2-3 x/daily prn - recommend voltaren gel 1%- apply to left hip 3-4x/daily prn for pain - DG Hip Unilat W OR W/O Pelvis 2-3 Views Left; Future - recommend scheduling with [facility] if pain persists  2. Abnormal gait - furniture grabbing - falls safety precautions discussed - recommend using walker   Total time: 21 minutes. Greater than  50% of total time spent ding patient education on hip pain and falls precautions.    Next appt: none Evetta Renner [place], [facility] [phone]

## 2021-03-02 NOTE — Patient Instructions (Addendum)
Orders sent to [facility]   May take tylenol 1000 mg 2-3 times a day as needed for pain  Voltren gel- apply to left hip 3-4 times a day for pain   Please use walker for safety- especially if furniture grabbing

## 2021-03-08 ENCOUNTER — Encounter: Payer: Self-pay | Admitting: Nurse Practitioner

## 2021-03-08 ENCOUNTER — Other Ambulatory Visit: Payer: Self-pay

## 2021-03-08 ENCOUNTER — Telehealth: Payer: Self-pay

## 2021-03-08 ENCOUNTER — Ambulatory Visit (INDEPENDENT_AMBULATORY_CARE_PROVIDER_SITE_OTHER): Payer: Medicare Other | Admitting: Nurse Practitioner

## 2021-03-08 ENCOUNTER — Ambulatory Visit: Payer: Medicare Other | Admitting: Orthopedic Surgery

## 2021-03-08 DIAGNOSIS — J019 Acute sinusitis, unspecified: Secondary | ICD-10-CM | POA: Diagnosis not present

## 2021-03-08 MED ORDER — FLUTICASONE PROPIONATE 50 MCG/ACT NA SUSP: 2.0000 | Freq: Every day | NASAL | 6 refills | Status: DC

## 2021-03-08 NOTE — Progress Notes (Signed)
This service is provided via telemedicine  No vital signs collected/recorded due to the encounter was a telemedicine visit.   Location of patient (ex: home, work):  Home   Patient consents to a telephone visit:  Yes  Location of the provider (ex: office, home):  [facility]  Name of any referring provider:  Sherrie Mustache, NP  Names of all persons participating in the telemedicine service and their role in the encounter:  Patient, Vernee Baines and Sherrie Mustache, NP  Time spent on call:  7 mins

## 2021-03-08 NOTE — Telephone Encounter (Signed)
I connected with  Ashley Lawson on 03/08/21 by a video enabled telemedicine application and verified that I am speaking with the correct person using two identifiers.   I discussed the limitations of evaluation and management by telemedicine. The patient expressed understanding and agreed to proceed.

## 2021-03-08 NOTE — Progress Notes (Signed)
Careteam: Patient Care Team: Ngetich, [name], NP as PCP - General (Family Medicine) Burnell Blanks, MD as PCP - Cardiology (Cardiology) Bjorn Loser, MD (Urology) Particia Nearing, MD (Dermatology) Luberta Mutter, MD (Ophthalmology) Almedia Balls, MD (Orthopedic Surgery) Ouida Sills  (Rheumatology) Jarome Matin, MD as Consulting Physician (Dermatology) Gatha Mayer, MD as Consulting Physician (Gastroenterology) Rozetta Nunnery, MD as Consulting Physician (Otolaryngology)  Advanced Directive information    Allergies  Allergen Reactions   Alendronate Sodium     upset stomach   Penicillins     Has patient had a PCN reaction causing immediate rash, facial/tongue/throat swelling, SOB or lightheadedness with hypotension: Yes Has patient had a PCN reaction causing severe rash involving mucus membranes or skin necrosis: No Has patient had a PCN reaction that required hospitalization: No Has patient had a PCN reaction occurring within the last 10 years: No If all of the above answers are "NO", then may proceed with Cephalosporin use.    Amoxicillin Rash    Has patient had a PCN reaction causing immediate rash, facial/tongue/throat swelling, SOB or lightheadedness with hypotension: yes Has patient had a PCN reaction causing severe rash involving mucus membranes or skin necrosis: no Has patient had a PCN reaction that required hospitalization: no Has patient had PCN reaction within the last 10 years: no  If all of the above answers are "NO", then may proceed with Cephalosporin use.  REACTION: unspecified   Metronidazole Other (See Comments)    Pounding headaches patient says was bad.  With cipro for diverticulitis rx. Other reaction(s): Other (See Comments) Pounding headaches patient says was bad.  With cipro for diverticulitis rx.    Chief Complaint  Patient presents with   Acute Visit    Possible sinus re infection. 2 months ago she complete antibiotics.  She states she feels the same way as before on her right sinus. Face is swollen and hurt behind her nose. She says her glands feel weird and having to clear her throat constantly.      HPI: Patient is a 87 y.o. female due to possible sinus infection. Symptoms for 3-4 days.   Reports she gets allergies and takes mucinex but then does not get any better.  Also does nasal rinse.  Reports ears feel stopped up but no ear ache.  Frequently clearing her throat. No cough.  No sore throat but scratchy.  Reports she has drainage on the left side. Full feeling on right side.  Reports she had not had fever.   Review of Systems:  Review of Systems  Constitutional:  Negative for chills, fever and weight loss.  HENT:  Positive for sinus pain. Negative for ear discharge, ear pain and sore throat.   Respiratory:  Negative for cough and stridor.   Endo/Heme/Allergies:  Positive for environmental allergies.   Past Medical History:  Diagnosis Date   Abdominal pain 12/2009   hospitalized   Allergic rhinitis    Anxiety    Bladder problem    Chronic constipation    Compression fx, lumbar spine ([facility])    DDD (degenerative disc disease), lumbar    Diverticulosis of colon    Dog bite(E906.0) 08/30/2011   Dysrhythmia    GERD (gastroesophageal reflux disease)    Hepatic steatosis    pt denies   Hyperlipidemia    Hyperplastic colon polyp    IBS (irritable bowel syndrome)    constipation predominant   Internal hemorrhoids    Osteoporosis    dexa 2011 -2.7  hip nl spine   PAF (paroxysmal atrial fibrillation) ([place])    a. dx 05/2017.   PMR (polymyalgia rheumatica) ([place]) 08/30/2011   under rheum care  on low dose pred 5 mg    Renal disorder    Past Surgical History:  Procedure Laterality Date   ABDOMINAL HYSTERECTOMY     with bladder tact and rectocele repair   BLADDER SURGERY     Streched   CATARACT EXTRACTION  2010   both   COLONOSCOPY  2016 was most recent   CYSTO WITH HYDRODISTENSION N/A  10/05/2019   Procedure: CYSTOSCOPY/HYDRODISTENSION;  Surgeon: Bjorn Loser, MD;  Location: [facility];  Service: Urology;  Laterality: N/A;   TONSILLECTOMY AND ADENOIDECTOMY     Social History:   reports that she has never smoked. She has never used smokeless tobacco. She reports current alcohol use. She reports that she does not use drugs.  Family History  Problem Relation Age of Onset   Stroke Mother 40   Liver disease Brother 18   Heart attack Father 63   Alzheimer's disease Sister 67   Bladder Cancer Sister 12   Crohn's disease Son    Colon cancer Neg Hx    Esophageal cancer Neg Hx    Stomach cancer Neg Hx    Rectal cancer Neg Hx    Liver cancer Neg Hx     Medications: Patient's Medications  New Prescriptions   No medications on file  Previous Medications   ACETAMINOPHEN (TYLENOL) 500 MG TABLET    Take 500 mg by mouth at bedtime as needed.   ASCORBIC ACID (VITAMIN C) 100 MG TABLET    Take 500 mg by mouth daily.   CALCIUM CARBONATE (OS-CAL) 1250 (500 CA) MG CHEWABLE TABLET    Chew 1 tablet by mouth daily.    DILTIAZEM (CARDIZEM CD) 120 MG 24 HR CAPSULE    TAKE 1 CAPSULE BY MOUTH EVERY DAY   ELIQUIS 5 MG TABS TABLET    TAKE 1 TABLET BY MOUTH TWICE A DAY   ESCITALOPRAM (LEXAPRO) 5 MG TABLET    Take one tablet by mouth once daily at bedtime.   GUAIFENESIN (MUCINEX) 600 MG 12 HR TABLET    Take 600 mg by mouth daily as needed.   LINACLOTIDE (LINZESS) 72 MCG CAPSULE    Take 1 capsule (72 mcg total) by mouth daily before breakfast.   LORATADINE (CLARITIN) 10 MG TABLET    Take 10 mg by mouth daily as needed.   MULTIPLE VITAMIN (MULTIVITAMIN) TABLET    Take 1 tablet by mouth daily.   POLYETHYLENE GLYCOL POWDER (GLYCOLAX/MIRALAX) 17 GM/SCOOP POWDER    Take 17 g by mouth daily. Hold for loose stool   PREMARIN VAGINAL CREAM    INSERT 1 GRAM VAGINALLY THREE TIMES A WEEKLY, THEN ONCE WEEKLY   PROBIOTIC PRODUCT (ALIGN) 4 MG CAPS    Take 1 capsule by mouth as needed.   RESTASIS 0.05 %  OPHTHALMIC EMULSION    Place 1 drop into both eyes 2 (two) times daily.   TRIAMCINOLONE (NASACORT) 55 MCG/ACT AERO NASAL INHALER    2 SPRAYS INTO EACH NOSTRIL AT NIGHT  Modified Medications   No medications on file  Discontinued Medications   No medications on file    Physical Exam:  There were no vitals filed for this visit. There is no height or weight on file to calculate BMI. Wt Readings from Last 3 Encounters:  03/02/21 164 lb 12.8 oz (74.8 kg)  02/14/21 165  lb (74.8 kg)  01/17/21 166 lb 9.6 oz (75.6 kg)      Labs reviewed: Basic Metabolic Panel: Recent Labs    06/30/20 0000 11/03/20 0931  NA 136 135  K 4.3 4.3  CL 102 100  CO2 23 26  GLUCOSE 85 82  BUN 9 9  CREATININE 0.74 0.79  CALCIUM 9.0 9.0  TSH  --  5.36*   Liver Function Tests: Recent Labs    06/30/20 0000 11/03/20 0931  AST 21 16  ALT 14 13  BILITOT 0.9 0.7  PROT 6.7 6.7   No results for input(s): LIPASE, AMYLASE in the last 8760 hours. No results for input(s): AMMONIA in the last 8760 hours. CBC: Recent Labs    06/30/20 0000 10/11/20 1536 11/03/20 0931  WBC 4.4 7.3 5.0  NEUTROABS 2,521 4,555 3,005  HGB 12.4 13.5 13.3  HCT 37.1 40.5 39.7  MCV 95.1 92.9 94.7  PLT 240 258 245   Lipid Panel: Recent Labs    06/30/20 0000 11/03/20 0931  CHOL 177 178  HDL 61 66  LDLCALC 93 90  TRIG 130 122  CHOLHDL 2.9 2.7   TSH: Recent Labs    11/03/20 0931  TSH 5.36*   A1C: No results found for: HGBA1C   Assessment/Plan 1. Acute sinusitis, recurrence not specified, unspecified location -symptoms of sinusitis likely viral vs allergic at this time, recommend to take COVID test to rule out COVID. Also to start zyrtec 10 mg daily due to allergies  -neti pot daily Plain nasal saline spray throughout the day as needed May use tylenol 325 mg 2 tablets every 6 hours as needed aches and pains or sore throat humidifier in the home to help with the dry air Mucinex DM by mouth twice daily as needed  for cough and congestion with full glass of water  Keep well hydrated Avoid forcefully blowing nose - fluticasone (FLONASE) 50 MCG/ACT nasal spray; Place 2 sprays into both nostrils daily.  Dispense: 16 g; Refill: 6 -recommend to try symptom management for at least 3 days if no improvement to notify  Apolonia Ellwood K. Harle Battiest  [facility] [phone]    Virtual Visit via telephone  I connected with patient on 03/08/21 at  3:45 PM EST by telephone and verified that I am speaking with the correct person using two identifiers.  Location: Patient: home Provider: [place]   I discussed the limitations, risks, security and privacy concerns of performing an evaluation and management service by telephone and the availability of in person appointments. I also discussed with the patient that there may be a patient responsible charge related to this service. The patient expressed understanding and agreed to proceed.   I discussed the assessment and treatment plan with the patient. The patient was provided an opportunity to ask questions and all were answered. The patient agreed with the plan and demonstrated an understanding of the instructions.   The patient was advised to call back or seek an in-person evaluation if the symptoms worsen or if the condition fails to improve as anticipated.  I provided 15 minutes of non-face-to-face time during this encounter.  Carlos American. Harle Battiest Avs printed and mailed

## 2021-03-09 ENCOUNTER — Other Ambulatory Visit: Payer: Self-pay | Admitting: Family

## 2021-03-09 DIAGNOSIS — F419 Anxiety disorder, unspecified: Secondary | ICD-10-CM

## 2021-03-13 ENCOUNTER — Ambulatory Visit: Payer: Medicare Other | Admitting: Family

## 2021-03-13 ENCOUNTER — Telehealth: Payer: Medicare Other | Admitting: Nurse Practitioner

## 2021-03-13 ENCOUNTER — Other Ambulatory Visit: Payer: Self-pay

## 2021-03-13 ENCOUNTER — Encounter: Payer: Self-pay | Admitting: Family

## 2021-03-13 VITALS — BP 126/70 | HR 60 | Temp 97.6°F | Resp 16 | Ht 60.0 in

## 2021-03-13 DIAGNOSIS — J0101 Acute recurrent maxillary sinusitis: Secondary | ICD-10-CM

## 2021-03-13 DIAGNOSIS — J392 Other diseases of pharynx: Secondary | ICD-10-CM

## 2021-03-13 DIAGNOSIS — R070 Pain in throat: Secondary | ICD-10-CM | POA: Diagnosis not present

## 2021-03-13 LAB — POCT RAPID STREP A (OFFICE): Rapid Strep A Screen: NEGATIVE

## 2021-03-13 MED ORDER — DOXYCYCLINE HYCLATE 100 MG PO TABS: 100.0000 mg | ORAL_TABLET | Freq: Two times a day (BID) | ORAL | 0 refills | Status: AC

## 2021-03-13 NOTE — Progress Notes (Signed)
Provider: Marlowe Sax FNP-C  Ashley Lawson, [name], NP  Patient Care Team: Gray Doering, [name], NP as PCP - General (Family Medicine) Burnell Blanks, MD as PCP - Cardiology (Cardiology) Bjorn Loser, MD (Urology) Particia Nearing, MD (Dermatology) Luberta Mutter, MD (Ophthalmology) Almedia Balls, MD (Orthopedic Surgery) Ouida Sills  (Rheumatology) Jarome Matin, MD as Consulting Physician (Dermatology) Gatha Mayer, MD as Consulting Physician (Gastroenterology) Rozetta Nunnery, MD as Consulting Physician (Otolaryngology)  Extended Emergency Contact Information Primary Emergency Contact: Apple,Karen Address: [address]          [place] [address] [name] of [place] Phone: [phone] Work Phone: [phone] Mobile Phone: [phone] Relation: Daughter  Code Status:  Full Code  Goals of care: Advanced Directive information Advanced Directives 03/13/2021  Does Patient Have a Medical Advance Directive? Yes  Type of Paramedic of [place];Living will  Does patient want to make changes to medical advance directive? No - Patient declined  Copy of [place] in Chart? Yes - validated most recent copy scanned in chart (See row information)  Would patient like information on creating a medical advance directive? -  Pre-existing out of facility DNR order (yellow form or pink MOST form) -     Chief Complaint  Patient presents with   Acute Visit    Patient complains of throat feeling raw and having white coating. Symptom started 2 days ago.     HPI:  Pt is a 87 y.o. female seen today for an acute visit for evaluation of sore throat and sinus pain x 3 days.Has had to frequently clear the throat.Has sinus drainage.she denies any fever or chills   Past Medical History:  Diagnosis Date   Abdominal pain 12/2009   hospitalized   Allergic rhinitis    Anxiety    Bladder problem    Chronic constipation     Compression fx, lumbar spine (HCC)    DDD (degenerative disc disease), lumbar    Diverticulosis of colon    Dog bite(E906.0) 08/30/2011   Dysrhythmia    GERD (gastroesophageal reflux disease)    Hepatic steatosis    pt denies   Hyperlipidemia    Hyperplastic colon polyp    IBS (irritable bowel syndrome)    constipation predominant   Internal hemorrhoids    Osteoporosis    dexa 2011 -2.7 hip nl spine   PAF (paroxysmal atrial fibrillation) ([place])    a. dx 05/2017.   PMR (polymyalgia rheumatica) ([place]) 08/30/2011   under rheum care  on low dose pred 5 mg    Renal disorder    Past Surgical History:  Procedure Laterality Date   ABDOMINAL HYSTERECTOMY     with bladder tact and rectocele repair   BLADDER SURGERY     Streched   CATARACT EXTRACTION  2010   both   COLONOSCOPY  2016 was most recent   CYSTO WITH HYDRODISTENSION N/A 10/05/2019   Procedure: CYSTOSCOPY/HYDRODISTENSION;  Surgeon: Bjorn Loser, MD;  Location: [facility];  Service: Urology;  Laterality: N/A;   TONSILLECTOMY AND ADENOIDECTOMY      Allergies  Allergen Reactions   Alendronate Sodium     upset stomach   Penicillins     Has patient had a PCN reaction causing immediate rash, facial/tongue/throat swelling, SOB or lightheadedness with hypotension: Yes Has patient had a PCN reaction causing severe rash involving mucus membranes or skin necrosis: No Has patient had a PCN reaction that required hospitalization: No Has patient had a PCN reaction  occurring within the last 10 years: No If all of the above answers are "NO", then may proceed with Cephalosporin use.    Amoxicillin Rash    Has patient had a PCN reaction causing immediate rash, facial/tongue/throat swelling, SOB or lightheadedness with hypotension: yes Has patient had a PCN reaction causing severe rash involving mucus membranes or skin necrosis: no Has patient had a PCN reaction that required hospitalization: no Has patient had PCN reaction within the last 10  years: no  If all of the above answers are "NO", then may proceed with Cephalosporin use.  REACTION: unspecified   Metronidazole Other (See Comments)    Pounding headaches patient says was bad.  With cipro for diverticulitis rx. Other reaction(s): Other (See Comments) Pounding headaches patient says was bad.  With cipro for diverticulitis rx.    Outpatient Encounter Medications as of 03/13/2021  Medication Sig   acetaminophen (TYLENOL) 500 MG tablet Take 500 mg by mouth at bedtime as needed.   Ascorbic Acid (VITAMIN C) 100 MG tablet Take 500 mg by mouth daily.   calcium carbonate (OS-CAL) 1250 (500 Ca) MG chewable tablet Chew 1 tablet by mouth daily.    diltiazem (CARDIZEM CD) 120 MG 24 hr capsule TAKE 1 CAPSULE BY MOUTH EVERY DAY   doxycycline (VIBRA-TABS) 100 MG tablet Take 1 tablet (100 mg total) by mouth 2 (two) times daily for 10 days.   ELIQUIS 5 MG TABS tablet TAKE 1 TABLET BY MOUTH TWICE A DAY   escitalopram (LEXAPRO) 5 MG tablet TAKE 1 TABLET BY MOUTH EVERYDAY AT BEDTIME   fluticasone (FLONASE) 50 MCG/ACT nasal spray Place 2 sprays into both nostrils daily.   guaiFENesin (MUCINEX) 600 MG 12 hr tablet Take 600 mg by mouth daily as needed.   loratadine (CLARITIN) 10 MG tablet Take 10 mg by mouth daily as needed.   Multiple Vitamin (MULTIVITAMIN) tablet Take 1 tablet by mouth daily.   polyethylene glycol powder (GLYCOLAX/MIRALAX) 17 GM/SCOOP powder Take 17 g by mouth daily. Hold for loose stool   PREMARIN vaginal cream INSERT 1 GRAM VAGINALLY THREE TIMES A WEEKLY, THEN ONCE WEEKLY   Probiotic Product (ALIGN) 4 MG CAPS Take 1 capsule by mouth as needed.   RESTASIS 0.05 % ophthalmic emulsion Place 1 drop into both eyes 2 (two) times daily.   triamcinolone (NASACORT) 55 MCG/ACT AERO nasal inhaler 2 SPRAYS INTO EACH NOSTRIL AT NIGHT   [DISCONTINUED] linaclotide (LINZESS) 72 MCG capsule Take 1 capsule (72 mcg total) by mouth daily before breakfast. (Patient not taking: Reported on  03/02/2021)   No facility-administered encounter medications on file as of 03/13/2021.    Review of Systems  Constitutional:  Negative for appetite change, chills, fatigue and fever.  HENT:  Positive for postnasal drip, sinus pressure, sinus pain and sore throat. Negative for congestion, rhinorrhea and sneezing.   Eyes:  Negative for discharge, redness and itching.  Respiratory:  Positive for cough. Negative for chest tightness, shortness of breath and wheezing.   Cardiovascular:  Negative for chest pain, palpitations and leg swelling.  Gastrointestinal:  Negative for abdominal distention, abdominal pain, constipation, diarrhea, nausea and vomiting.  Skin:  Negative for color change, pallor and rash.  Neurological:  Negative for dizziness, light-headedness and headaches.   Immunization History  Administered Date(s) Administered   Fluad Quad(high Dose 65+) 01/10/2020   Influenza Split 01/25/2011, 01/23/2012   Influenza Whole 12/31/2007   Influenza, High Dose Seasonal PF 01/19/2013, 01/10/2014, 01/17/2015, 01/18/2016, 01/02/2017, 01/05/2018, 01/05/2019, 01/05/2019, 02/07/2021  Influenza-Unspecified 01/02/2017   PFIZER(Purple Top)SARS-COV-2 Vaccination 05/07/2019, 06/02/2019, 01/31/2020, 08/02/2020   Pfizer Covid-19 Vaccine Bivalent Booster 70yr & up 02/19/2021   Pneumococcal Conjugate-13 02/23/2013   Pneumococcal Polysaccharide-23 04/01/2006, 04/01/2018   Td 04/01/2005   Td (Adult) 11/22/2016   Tdap 08/30/2011   Zoster Recombinat (Shingrix) 12/04/2016, 07/27/2017   Zoster, Live 04/02/2007   Pertinent  Health Maintenance Due  Topic Date Due   MAMMOGRAM  09/09/2022   INFLUENZA VACCINE  Completed   DEXA SCAN  Completed   Fall Risk 11/09/2020 11/10/2020 12/13/2020 03/02/2021 03/13/2021  Falls in the past year? 0 0 0 0 0  Was there an injury with Fall? 0 0 0 0 0  Was there an injury with Fall? - - - - -  Fall Risk Category Calculator 0 0 0 0 0  Fall Risk Category Low Low Low Low Low   Patient Fall Risk Level Low fall risk Low fall risk Low fall risk Low fall risk Low fall risk  Patient at Risk for Falls Due to No Fall Risks History of fall(s) No Fall Risks No Fall Risks No Fall Risks  Fall risk Follow up Falls evaluation completed Falls evaluation completed;Education provided;Falls prevention discussed Falls evaluation completed Falls evaluation completed Falls evaluation completed   Functional Status Survey:    Vitals:   03/13/21 1500  BP: 126/70  Pulse: 60  Resp: 16  Temp: 97.6 F (36.4 C)  SpO2: 96%  Height: 5' (1.524 m)   Body mass index is 32.19 kg/m. Physical Exam Vitals reviewed.  Constitutional:      General: She is not in acute distress.    Appearance: Normal appearance. She is normal weight. She is not ill-appearing or diaphoretic.  HENT:     Head: Normocephalic.     Right Ear: Tympanic membrane, ear canal and external ear normal. There is no impacted cerumen.     Left Ear: Tympanic membrane, ear canal and external ear normal. There is no impacted cerumen.     Nose: No congestion or rhinorrhea.     Right Sinus: Maxillary sinus tenderness present.     Left Sinus: Maxillary sinus tenderness present.     Mouth/Throat:     Mouth: Mucous membranes are moist.     Pharynx: Oropharynx is clear. Posterior oropharyngeal erythema present. No oropharyngeal exudate.  Eyes:     General: No scleral icterus.       Right eye: No discharge.        Left eye: No discharge.     Extraocular Movements: Extraocular movements intact.     Conjunctiva/sclera: Conjunctivae normal.     Pupils: Pupils are equal, round, and reactive to light.  Neck:     Vascular: No carotid bruit.  Cardiovascular:     Rate and Rhythm: Normal rate and regular rhythm.     Pulses: Normal pulses.     Heart sounds: Normal heart sounds. No murmur heard.   No friction rub. No gallop.  Pulmonary:     Effort: Pulmonary effort is normal. No respiratory distress.     Breath sounds: Normal  breath sounds. No wheezing, rhonchi or rales.  Chest:     Chest wall: No tenderness.  Abdominal:     General: Bowel sounds are normal. There is no distension.     Palpations: Abdomen is soft. There is no mass.     Tenderness: There is no abdominal tenderness. There is no right CVA tenderness, left CVA tenderness, guarding or rebound.  Musculoskeletal:  General: No swelling or tenderness. Normal range of motion.     Cervical back: Normal range of motion. No rigidity or tenderness.     Right lower leg: No edema.     Left lower leg: No edema.  Lymphadenopathy:     Cervical: No cervical adenopathy.  Skin:    General: Skin is warm and dry.     Coloration: Skin is not pale.     Findings: No bruising, erythema, lesion or rash.  Neurological:     Mental Status: She is alert and oriented to person, place, and time.     Motor: No weakness.     Gait: Gait normal.  Psychiatric:        Mood and Affect: Mood normal.        Speech: Speech normal.        Behavior: Behavior normal.    Labs reviewed: Recent Labs    06/30/20 0000 11/03/20 0931  NA 136 135  K 4.3 4.3  CL 102 100  CO2 23 26  GLUCOSE 85 82  BUN 9 9  CREATININE 0.74 0.79  CALCIUM 9.0 9.0   Recent Labs    06/30/20 0000 11/03/20 0931  AST 21 16  ALT 14 13  BILITOT 0.9 0.7  PROT 6.7 6.7   Recent Labs    06/30/20 0000 10/11/20 1536 11/03/20 0931  WBC 4.4 7.3 5.0  NEUTROABS 2,521 4,555 3,005  HGB 12.4 13.5 13.3  HCT 37.1 40.5 39.7  MCV 95.1 92.9 94.7  PLT 240 258 245   Lab Results  Component Value Date   TSH 5.36 (H) 11/03/2020   No results found for: HGBA1C Lab Results  Component Value Date   CHOL 178 11/03/2020   HDL 66 11/03/2020   LDLCALC 90 11/03/2020   LDLDIRECT 100.0 04/07/2019   TRIG 122 11/03/2020   CHOLHDL 2.7 11/03/2020    Significant Diagnostic Results in last 30 days:  DG Hip Unilat W OR W/O Pelvis 2-3 Views Left  Result Date: 03/05/2021 CLINICAL DATA:  Left hip pain. EXAM: DG  HIP (WITH OR WITHOUT PELVIS) 2-3V LEFT COMPARISON:  None. FINDINGS: There is no evidence of hip fracture or dislocation. There is no evidence of arthropathy or other focal bone abnormality. IMPRESSION: Negative. Electronically Signed   By: Ronney Asters M.D.   On: 03/05/2021 20:08    Assessment/Plan  1. Irritated throat Afebrile. Oral pharynx erythema noted  - continue with warm water and salt gurgle daily   2. Throat pain Tylenol 500 mg tablet one by mouth every 8 hrs as needed for apin  - doxycycline (VIBRA-TABS) 100 MG tablet; Take 1 tablet (100 mg total) by mouth 2 (two) times daily for 10 days.  Dispense: 20 tablet; Refill: 0  3. Acute recurrent maxillary sinusitis Bilateral maxillary tenderness to palpation.  - POC Rapid Strep A results negative  Start on Doxycycline as below - continue on Align probiotics to prevent diarrhea associated with ABXs.  - doxycycline (VIBRA-TABS) 100 MG tablet; Take 1 tablet (100 mg total) by mouth 2 (two) times daily for 10 days.  Dispense: 20 tablet; Refill: 0  Family/ staff Communication: Reviewed plan of care with patient and daughter verbalized understanding.   Labs/tests ordered: - POC Rapid Strep A   Next Appointment: As needed if symptoms worsen or fail to improve    Sandrea Hughs, NP

## 2021-03-13 NOTE — Patient Instructions (Signed)
-   Take Tylenol 500 mg tablet one by mouth three time daily as needed for pain   - Drink tea/soup to sooth throat   - Notify provider if symptoms worsen or fail to improve

## 2021-03-20 ENCOUNTER — Telehealth: Payer: Self-pay | Admitting: Internal Medicine

## 2021-03-20 NOTE — Telephone Encounter (Signed)
Good Afternoon Dr. Carlean Purl,  Patient called to reschedule appointment with you for tomorrow at 1:30 due to being sick.  Patient was rescheduled for 1/27 at 10:50.

## 2021-03-20 NOTE — Telephone Encounter (Signed)
Thanks for letting me know.  We do not need to charge the patient.

## 2021-03-21 ENCOUNTER — Ambulatory Visit: Payer: Medicare Other | Admitting: Internal Medicine

## 2021-03-23 ENCOUNTER — Telehealth: Payer: Self-pay

## 2021-03-23 NOTE — Telephone Encounter (Signed)
Recommend waiting for few more days since you just completed the antibiotics.Follow up next week if still having any symptoms.

## 2021-03-23 NOTE — Telephone Encounter (Signed)
Patient called requesting additional antibiotic to be send into pharmacy for her over the weekend due to still having throat irritation.  She reports completing the doxycycline that was prescribed on 03/13/21. She was advised that appointment maybe need but a message was going to be send to the provider for approval of additional antibiotics. Please advise.

## 2021-03-23 NOTE — Telephone Encounter (Signed)
Patient was advised and verbalized understanding.

## 2021-03-29 ENCOUNTER — Ambulatory Visit: Payer: Medicare Other | Admitting: Family

## 2021-03-29 ENCOUNTER — Encounter: Payer: Self-pay | Admitting: Family

## 2021-03-29 ENCOUNTER — Other Ambulatory Visit: Payer: Self-pay

## 2021-03-29 VITALS — BP 130/70 | HR 63 | Temp 96.6°F | Resp 16 | Ht 60.0 in | Wt 165.0 lb

## 2021-03-29 DIAGNOSIS — R070 Pain in throat: Secondary | ICD-10-CM | POA: Diagnosis not present

## 2021-03-29 DIAGNOSIS — R0982 Postnasal drip: Secondary | ICD-10-CM | POA: Diagnosis not present

## 2021-03-29 DIAGNOSIS — J0101 Acute recurrent maxillary sinusitis: Secondary | ICD-10-CM

## 2021-03-29 DIAGNOSIS — J392 Other diseases of pharynx: Secondary | ICD-10-CM | POA: Diagnosis not present

## 2021-03-29 MED ORDER — AZITHROMYCIN 250 MG PO TABS: ORAL_TABLET | ORAL | 0 refills | Status: DC

## 2021-03-29 MED ORDER — LORATADINE 10 MG PO TABS: 10.0000 mg | ORAL_TABLET | Freq: Every day | ORAL | 0 refills | Status: DC

## 2021-03-29 NOTE — Patient Instructions (Signed)

## 2021-03-29 NOTE — Progress Notes (Signed)
Provider: Marlowe Sax FNP-C  Laddie [name], [name], NP  Patient Care Team: Zakary Kimura, [name], NP as PCP - General (Family Medicine) Burnell Blanks, MD as PCP - Cardiology (Cardiology) Bjorn Loser, MD (Urology) Particia Nearing, MD (Dermatology) Luberta Mutter, MD (Ophthalmology) Almedia Balls, MD (Orthopedic Surgery) Ouida Sills  (Rheumatology) Jarome Matin, MD as Consulting Physician (Dermatology) Gatha Mayer, MD as Consulting Physician (Gastroenterology) Rozetta Nunnery, MD (Inactive) as Consulting Physician (Otolaryngology)  Extended Emergency Contact Information Primary Emergency Contact: Apple,Karen Address: [address]          [place] [address] [name] of [place] Phone: [phone] Work Phone: [phone] Mobile Phone: [phone] Relation: Daughter  Code Status:  Full code  Goals of care: Advanced Directive information Advanced Directives 03/29/2021  Does Patient Have a Medical Advance Directive? Yes  Type of Paramedic of [place];Living will  Does patient want to make changes to medical advance directive? No - Patient declined  Copy of [place] in Chart? Yes - validated most recent copy scanned in chart (See row information)  Would patient like information on creating a medical advance directive? -  Pre-existing out of facility DNR order (yellow form or pink MOST form) -     Chief Complaint  Patient presents with   Acute Visit    Patient complains of sinus issues not being resolved.     HPI:  Pt is a 87 y.o. female seen today for an acute visit for evaluation of sore throat,sinus pain,ears,hoarseness ongoing off and on. Has cleaned with saline Flonase but no relief.she denies any issues with acid reflux.  Has been treated with antibiotics several times  01/17/2021;03/08/2021; 03/13/2021 and had prednisone 03/19/2021 at urgent care for acute pharyngitis.  She denies any fever  or chills.     Past Medical History:  Diagnosis Date   Abdominal pain 12/2009   hospitalized   Allergic rhinitis    Anxiety    Bladder problem    Chronic constipation    Compression fx, lumbar spine (HCC)    DDD (degenerative disc disease), lumbar    Diverticulosis of colon    Dog bite(E906.0) 08/30/2011   Dysrhythmia    GERD (gastroesophageal reflux disease)    Hepatic steatosis    pt denies   Hyperlipidemia    Hyperplastic colon polyp    IBS (irritable bowel syndrome)    constipation predominant   Internal hemorrhoids    Osteoporosis    dexa 2011 -2.7 hip nl spine   PAF (paroxysmal atrial fibrillation) ([place])    a. dx 05/2017.   PMR (polymyalgia rheumatica) ([place]) 08/30/2011   under rheum care  on low dose pred 5 mg    Renal disorder    Past Surgical History:  Procedure Laterality Date   ABDOMINAL HYSTERECTOMY     with bladder tact and rectocele repair   BLADDER SURGERY     Streched   CATARACT EXTRACTION  2010   both   COLONOSCOPY  2016 was most recent   CYSTO WITH HYDRODISTENSION N/A 10/05/2019   Procedure: CYSTOSCOPY/HYDRODISTENSION;  Surgeon: Bjorn Loser, MD;  Location: [facility];  Service: Urology;  Laterality: N/A;   TONSILLECTOMY AND ADENOIDECTOMY      Allergies  Allergen Reactions   Alendronate Sodium     upset stomach   Penicillins     Has patient had a PCN reaction causing immediate rash, facial/tongue/throat swelling, SOB or lightheadedness with hypotension: Yes Has patient had a PCN reaction causing severe  rash involving mucus membranes or skin necrosis: No Has patient had a PCN reaction that required hospitalization: No Has patient had a PCN reaction occurring within the last 10 years: No If all of the above answers are "NO", then may proceed with Cephalosporin use.    Amoxicillin Rash    Has patient had a PCN reaction causing immediate rash, facial/tongue/throat swelling, SOB or lightheadedness with hypotension: yes Has patient had a PCN reaction  causing severe rash involving mucus membranes or skin necrosis: no Has patient had a PCN reaction that required hospitalization: no Has patient had PCN reaction within the last 10 years: no  If all of the above answers are "NO", then may proceed with Cephalosporin use.  REACTION: unspecified   Metronidazole Other (See Comments)    Pounding headaches patient says was bad.  With cipro for diverticulitis rx. Other reaction(s): Other (See Comments) Pounding headaches patient says was bad.  With cipro for diverticulitis rx.    Outpatient Encounter Medications as of 03/29/2021  Medication Sig   acetaminophen (TYLENOL) 500 MG tablet Take 500 mg by mouth at bedtime as needed.   Ascorbic Acid (VITAMIN C) 100 MG tablet Take 500 mg by mouth daily.   calcium carbonate (OS-CAL) 1250 (500 Ca) MG chewable tablet Chew 1 tablet by mouth daily.    diltiazem (CARDIZEM CD) 120 MG 24 hr capsule TAKE 1 CAPSULE BY MOUTH EVERY DAY   ELIQUIS 5 MG TABS tablet TAKE 1 TABLET BY MOUTH TWICE A DAY   escitalopram (LEXAPRO) 5 MG tablet TAKE 1 TABLET BY MOUTH EVERYDAY AT BEDTIME   fluticasone (FLONASE) 50 MCG/ACT nasal spray Place 2 sprays into both nostrils daily.   guaiFENesin (MUCINEX) 600 MG 12 hr tablet Take 600 mg by mouth daily as needed.   loratadine (CLARITIN) 10 MG tablet Take 10 mg by mouth daily as needed.   Multiple Vitamin (MULTIVITAMIN) tablet Take 1 tablet by mouth daily.   polyethylene glycol powder (GLYCOLAX/MIRALAX) 17 GM/SCOOP powder Take 17 g by mouth daily. Hold for loose stool   PREMARIN vaginal cream INSERT 1 GRAM VAGINALLY THREE TIMES A WEEKLY, THEN ONCE WEEKLY   Probiotic Product (ALIGN) 4 MG CAPS Take 1 capsule by mouth as needed.   RESTASIS 0.05 % ophthalmic emulsion Place 1 drop into both eyes 2 (two) times daily.   triamcinolone (NASACORT) 55 MCG/ACT AERO nasal inhaler 2 SPRAYS INTO EACH NOSTRIL AT NIGHT   No facility-administered encounter medications on file as of 03/29/2021.     Review of Systems  Constitutional:  Negative for appetite change, chills, fatigue and fever.  HENT:  Positive for ear pain, postnasal drip, sinus pressure, sinus pain and sore throat. Negative for congestion, ear discharge and trouble swallowing.        Wears dentures   Eyes:  Negative for pain, discharge, redness, itching and visual disturbance.  Respiratory:  Positive for cough. Negative for chest tightness, shortness of breath and wheezing.   Gastrointestinal:  Negative for abdominal distention, abdominal pain, constipation, nausea and vomiting.   Immunization History  Administered Date(s) Administered   Fluad Quad(high Dose 65+) 01/10/2020   Influenza Split 01/25/2011, 01/23/2012   Influenza Whole 12/31/2007   Influenza, High Dose Seasonal PF 01/19/2013, 01/10/2014, 01/17/2015, 01/18/2016, 01/02/2017, 01/05/2018, 01/05/2019, 01/05/2019, 02/07/2021   Influenza-Unspecified 01/02/2017   PFIZER(Purple Top)SARS-COV-2 Vaccination 05/07/2019, 06/02/2019, 01/31/2020, 08/02/2020   Pfizer Covid-19 Vaccine Bivalent Booster 47yr & up 02/19/2021   Pneumococcal Conjugate-13 02/23/2013   Pneumococcal Polysaccharide-23 04/01/2006, 04/01/2018   Td 04/01/2005  Td (Adult) 11/22/2016   Tdap 08/30/2011   Zoster Recombinat (Shingrix) 12/04/2016, 07/27/2017   Zoster, Live 04/02/2007   Pertinent  Health Maintenance Due  Topic Date Due   MAMMOGRAM  09/09/2022   INFLUENZA VACCINE  Completed   DEXA SCAN  Completed   Fall Risk 11/10/2020 12/13/2020 03/02/2021 03/13/2021 03/29/2021  Falls in the past year? 0 0 0 0 0  Was there an injury with Fall? 0 0 0 0 0  Was there an injury with Fall? - - - - -  Fall Risk Category Calculator 0 0 0 0 0  Fall Risk Category Low Low Low Low Low  Patient Fall Risk Level Low fall risk Low fall risk Low fall risk Low fall risk Low fall risk  Patient at Risk for Falls Due to History of fall(s) No Fall Risks No Fall Risks No Fall Risks No Fall Risks  Fall risk Follow  up Falls evaluation completed;Education provided;Falls prevention discussed Falls evaluation completed Falls evaluation completed Falls evaluation completed Falls evaluation completed   Functional Status Survey:    Vitals:   03/29/21 1441  BP: 130/70  Pulse: 63  Resp: 16  Temp: (!) 96.6 F (35.9 C)  SpO2: 90%  Weight: 165 lb (74.8 kg)  Height: 5' (1.524 m)   Body mass index is 32.22 kg/m. Physical Exam Vitals reviewed.  Constitutional:      General: She is not in acute distress.    Appearance: Normal appearance. She is normal weight. She is not ill-appearing or diaphoretic.  HENT:     Head: Normocephalic.     Right Ear: Tympanic membrane, ear canal and external ear normal. There is no impacted cerumen.     Left Ear: Tympanic membrane, ear canal and external ear normal. There is no impacted cerumen.     Nose: No congestion or rhinorrhea.     Right Sinus: Maxillary sinus tenderness present. No frontal sinus tenderness.     Left Sinus: Maxillary sinus tenderness present. No frontal sinus tenderness.     Mouth/Throat:     Mouth: Mucous membranes are moist.     Pharynx: Oropharynx is clear. No oropharyngeal exudate or posterior oropharyngeal erythema.  Eyes:     General: No scleral icterus.       Right eye: No discharge.        Left eye: No discharge.     Extraocular Movements: Extraocular movements intact.     Conjunctiva/sclera: Conjunctivae normal.     Pupils: Pupils are equal, round, and reactive to light.  Cardiovascular:     Rate and Rhythm: Normal rate and regular rhythm.     Pulses: Normal pulses.     Heart sounds: Normal heart sounds. No murmur heard.   No friction rub. No gallop.  Pulmonary:     Effort: Pulmonary effort is normal. No respiratory distress.     Breath sounds: Normal breath sounds. No wheezing, rhonchi or rales.  Chest:     Chest wall: No tenderness.  Musculoskeletal:        General: No swelling or tenderness. Normal range of motion.     Cervical  back: Normal range of motion. No rigidity or tenderness.     Right lower leg: No edema.     Left lower leg: No edema.  Lymphadenopathy:     Cervical: No cervical adenopathy.  Skin:    General: Skin is warm and dry.     Coloration: Skin is not pale.     Findings: No erythema  or rash.  Neurological:     Mental Status: She is alert and oriented to person, place, and time.     Motor: No weakness.     Gait: Gait normal.  Psychiatric:        Mood and Affect: Mood normal.        Speech: Speech normal.        Behavior: Behavior normal.    Labs reviewed: Recent Labs    06/30/20 0000 11/03/20 0931  NA 136 135  K 4.3 4.3  CL 102 100  CO2 23 26  GLUCOSE 85 82  BUN 9 9  CREATININE 0.74 0.79  CALCIUM 9.0 9.0   Recent Labs    06/30/20 0000 11/03/20 0931  AST 21 16  ALT 14 13  BILITOT 0.9 0.7  PROT 6.7 6.7   Recent Labs    06/30/20 0000 10/11/20 1536 11/03/20 0931  WBC 4.4 7.3 5.0  NEUTROABS 2,521 4,555 3,005  HGB 12.4 13.5 13.3  HCT 37.1 40.5 39.7  MCV 95.1 92.9 94.7  PLT 240 258 245   Lab Results  Component Value Date   TSH 5.36 (H) 11/03/2020   No results found for: HGBA1C Lab Results  Component Value Date   CHOL 178 11/03/2020   HDL 66 11/03/2020   LDLCALC 90 11/03/2020   LDLDIRECT 100.0 04/07/2019   TRIG 122 11/03/2020   CHOLHDL 2.7 11/03/2020    Significant Diagnostic Results in last 30 days:  DG Hip Unilat W OR W/O Pelvis 2-3 Views Left  Result Date: 03/05/2021 CLINICAL DATA:  Left hip pain. EXAM: DG HIP (WITH OR WITHOUT PELVIS) 2-3V LEFT COMPARISON:  None. FINDINGS: There is no evidence of hip fracture or dislocation. There is no evidence of arthropathy or other focal bone abnormality. IMPRESSION: Negative. Electronically Signed   By: Ronney Asters M.D.   On: 03/05/2021 20:08    Assessment/Plan  1. Acute recurrent maxillary sinusitis Afebrile. Bilateral maxillary tenderness to palpation. - Ambulatory referral to ENT - azithromycin (ZITHROMAX)  250 MG tablet; Take 2 tablets (500 mg ) by mouth x 1 dose then one tablet ( 250 mg ) by mouth daily x 4 days  Dispense: 6 tablet; Refill: 0  2. Throat pain Continue on tylenol as needed  - gurgle with warm water and salt  - Ambulatory referral to ENT  3. Irritated throat - Ambulatory referral to ENT  4. PND (post-nasal drip) Whitish drainage noted has improved compared to previous visit  - loratadine (CLARITIN) 10 MG tablet; Take 1 tablet (10 mg total) by mouth daily.  Dispense: 30 tablet; Refill: 0  Family/ staff Communication: Reviewed plan of care with patient and daughter verbalized understanding.   Labs/tests ordered: None   Next Appointment: As needed if symptoms worsen or fail to improve    Sandrea Hughs, NP

## 2021-04-05 DIAGNOSIS — N3020 Other chronic cystitis without hematuria: Secondary | ICD-10-CM | POA: Diagnosis not present

## 2021-04-05 DIAGNOSIS — R109 Unspecified abdominal pain: Secondary | ICD-10-CM | POA: Diagnosis not present

## 2021-04-11 ENCOUNTER — Telehealth: Payer: Self-pay | Admitting: *Deleted

## 2021-04-11 DIAGNOSIS — J0101 Acute recurrent maxillary sinusitis: Secondary | ICD-10-CM

## 2021-04-11 MED ORDER — AZITHROMYCIN 250 MG PO TABS: ORAL_TABLET | ORAL | 0 refills | Status: DC

## 2021-04-11 NOTE — Telephone Encounter (Signed)
Refill Z-pak x 1

## 2021-04-11 NOTE — Telephone Encounter (Signed)
Patient notified and agreed.  Rx sent to pharmacy.

## 2021-04-11 NOTE — Telephone Encounter (Signed)
Patient called and stated that she is better but not completely and requesting a Refill on the Azithromycin. Still having some Sinusitis symptoms.  Stated that she feels one more course of Azithromycin will help.   Please Advise.

## 2021-04-16 DIAGNOSIS — J309 Allergic rhinitis, unspecified: Secondary | ICD-10-CM | POA: Diagnosis not present

## 2021-04-16 DIAGNOSIS — R0982 Postnasal drip: Secondary | ICD-10-CM | POA: Diagnosis not present

## 2021-04-20 ENCOUNTER — Other Ambulatory Visit: Payer: Self-pay | Admitting: Family

## 2021-04-20 DIAGNOSIS — R0982 Postnasal drip: Secondary | ICD-10-CM

## 2021-04-27 ENCOUNTER — Ambulatory Visit: Payer: Medicare Other | Admitting: Internal Medicine

## 2021-05-07 ENCOUNTER — Other Ambulatory Visit: Payer: Medicare Other

## 2021-05-07 ENCOUNTER — Other Ambulatory Visit: Payer: Self-pay

## 2021-05-07 DIAGNOSIS — R42 Dizziness and giddiness: Secondary | ICD-10-CM | POA: Diagnosis not present

## 2021-05-07 DIAGNOSIS — N1831 Chronic kidney disease, stage 3a: Secondary | ICD-10-CM

## 2021-05-07 DIAGNOSIS — I480 Paroxysmal atrial fibrillation: Secondary | ICD-10-CM | POA: Diagnosis not present

## 2021-05-07 DIAGNOSIS — E785 Hyperlipidemia, unspecified: Secondary | ICD-10-CM

## 2021-05-07 DIAGNOSIS — R7989 Other specified abnormal findings of blood chemistry: Secondary | ICD-10-CM | POA: Diagnosis not present

## 2021-05-07 LAB — COMPLETE METABOLIC PANEL WITH GFR
AG Ratio: 1.7 (calc) (ref 1.0–2.5)
ALT: 15 U/L (ref 6–29)
AST: 20 U/L (ref 10–35)
Albumin: 4.3 g/dL (ref 3.6–5.1)
Alkaline phosphatase (APISO): 64 U/L (ref 37–153)
BUN/Creatinine Ratio: 8 (calc) (ref 6–22)
BUN: 6 mg/dL — ABNORMAL LOW (ref 7–25)
CO2: 25 mmol/L (ref 20–32)
Calcium: 9.4 mg/dL (ref 8.6–10.4)
Chloride: 101 mmol/L (ref 98–110)
Creat: 0.8 mg/dL (ref 0.60–0.95)
Globulin: 2.6 g/dL (calc) (ref 1.9–3.7)
Glucose, Bld: 86 mg/dL (ref 65–99)
Potassium: 3.8 mmol/L (ref 3.5–5.3)
Sodium: 136 mmol/L (ref 135–146)
Total Bilirubin: 1.1 mg/dL (ref 0.2–1.2)
Total Protein: 6.9 g/dL (ref 6.1–8.1)
eGFR: 72 mL/min/{1.73_m2} (ref >=60)

## 2021-05-07 LAB — CBC WITH DIFFERENTIAL/PLATELET
Absolute Monocytes: 445 cells/uL (ref 200–950)
Basophils Absolute: 58 cells/uL (ref 0–200)
Basophils Relative: 1.1 %
Eosinophils Absolute: 101 cells/uL (ref 15–500)
Eosinophils Relative: 1.9 %
HCT: 41.2 % (ref 35.0–45.0)
Hemoglobin: 13.7 g/dL (ref 11.7–15.5)
Lymphs Abs: 1526 cells/uL (ref 850–3900)
MCH: 31.6 pg (ref 27.0–33.0)
MCHC: 33.3 g/dL (ref 32.0–36.0)
MCV: 94.9 fL (ref 80.0–100.0)
MPV: 9.2 fL (ref 7.5–12.5)
Monocytes Relative: 8.4 %
Neutro Abs: 3169 cells/uL (ref 1500–7800)
Neutrophils Relative %: 59.8 %
Platelets: 248 10*3/uL (ref 140–400)
RBC: 4.34 10*6/uL (ref 3.80–5.10)
RDW: 12.6 % (ref 11.0–15.0)
Total Lymphocyte: 28.8 %
WBC: 5.3 10*3/uL (ref 3.8–10.8)

## 2021-05-07 LAB — LIPID PANEL
Cholesterol: 200 mg/dL — ABNORMAL HIGH (ref <200)
HDL: 66 mg/dL (ref >=50)
LDL Cholesterol (Calc): 112 mg/dL (calc) — ABNORMAL HIGH
Non-HDL Cholesterol (Calc): 134 mg/dL (calc) — ABNORMAL HIGH (ref <130)
Total CHOL/HDL Ratio: 3 (calc) (ref <5.0)
Triglycerides: 111 mg/dL (ref <150)

## 2021-05-07 LAB — TSH: TSH: 5.5 mIU/L — ABNORMAL HIGH (ref 0.40–4.50)

## 2021-05-10 ENCOUNTER — Encounter: Payer: Self-pay | Admitting: Family

## 2021-05-11 ENCOUNTER — Ambulatory Visit (INDEPENDENT_AMBULATORY_CARE_PROVIDER_SITE_OTHER): Payer: Medicare Other | Admitting: Family

## 2021-05-11 ENCOUNTER — Encounter: Payer: Self-pay | Admitting: Family

## 2021-05-11 ENCOUNTER — Other Ambulatory Visit: Payer: Self-pay

## 2021-05-11 VITALS — BP 100/60 | HR 67 | Temp 97.1°F | Resp 16 | Ht 60.0 in | Wt 164.0 lb

## 2021-05-11 DIAGNOSIS — E785 Hyperlipidemia, unspecified: Secondary | ICD-10-CM

## 2021-05-11 DIAGNOSIS — R7989 Other specified abnormal findings of blood chemistry: Secondary | ICD-10-CM

## 2021-05-11 DIAGNOSIS — I480 Paroxysmal atrial fibrillation: Secondary | ICD-10-CM

## 2021-05-11 DIAGNOSIS — M810 Age-related osteoporosis without current pathological fracture: Secondary | ICD-10-CM

## 2021-05-11 DIAGNOSIS — F419 Anxiety disorder, unspecified: Secondary | ICD-10-CM

## 2021-05-11 NOTE — Progress Notes (Signed)
Provider: Marlowe Sax FNP-C   Elishua Radford, [name], NP  Patient Care Team: Brittni Hult, [name], NP as PCP - General (Family Medicine) Burnell Blanks, MD as PCP - Cardiology (Cardiology) Bjorn Loser, MD (Urology) Particia Nearing, MD (Dermatology) Luberta Mutter, MD (Ophthalmology) Almedia Balls, MD (Orthopedic Surgery) Ouida Sills  (Rheumatology) Jarome Matin, MD as Consulting Physician (Dermatology) Gatha Mayer, MD as Consulting Physician (Gastroenterology) Rozetta Nunnery, MD (Inactive) as Consulting Physician (Otolaryngology)  Extended Emergency Contact Information Primary Emergency Contact: Apple,Karen Address: [address]          [place] [address] [name] of [place] Phone: [phone] Work Phone: [phone] Mobile Phone: [phone] Relation: Daughter  Code Status:  Full Code  Goals of care: Advanced Directive information Advanced Directives 05/10/2021  Does Patient Have a Medical Advance Directive? Yes  Type of Paramedic of [place];Living will  Does patient want to make changes to medical advance directive? No - Patient declined  Copy of [place] in Chart? Yes - validated most recent copy scanned in chart (See row information)  Would patient like information on creating a medical advance directive? -  Pre-existing out of facility DNR order (yellow form or pink MOST form) -     Chief Complaint  Patient presents with   Medical Management of Chronic Issues    6 month follow up.    HPI:  Pt is a 88 y.o. female seen today for 6 months follow up for medical management of chronic diseases.  She is here with her daughter. Recent lab work reviewed and discussed  unremarkable except total cholesterol and LDL went up chol 200,LDL 112 .Her TSH  5.50 previous 5.36 Has been eating ice cream daily at bedtime   Does exercise on the treadmill.Feels like this has helped strengthen her legs.No  fall episode  Previous sinus infection has resolved. Had GI appointment that she rescheduled several times but has another upcoming appointment.     Past Medical History:  Diagnosis Date   Abdominal pain 12/2009   hospitalized   Allergic rhinitis    Anxiety    Bladder problem    Chronic constipation    Compression fx, lumbar spine (HCC)    DDD (degenerative disc disease), lumbar    Diverticulosis of colon    Dog bite(E906.0) 08/30/2011   Dysrhythmia    GERD (gastroesophageal reflux disease)    Hepatic steatosis    pt denies   Hyperlipidemia    Hyperplastic colon polyp    IBS (irritable bowel syndrome)    constipation predominant   Internal hemorrhoids    Osteoporosis    dexa 2011 -2.7 hip nl spine   PAF (paroxysmal atrial fibrillation) ([place])    a. dx 05/2017.   PMR (polymyalgia rheumatica) ([place]) 08/30/2011   under rheum care  on low dose pred 5 mg    Renal disorder    Past Surgical History:  Procedure Laterality Date   ABDOMINAL HYSTERECTOMY     with bladder tact and rectocele repair   BLADDER SURGERY     Streched   CATARACT EXTRACTION  2010   both   COLONOSCOPY  2016 was most recent   CYSTO WITH HYDRODISTENSION N/A 10/05/2019   Procedure: CYSTOSCOPY/HYDRODISTENSION;  Surgeon: Bjorn Loser, MD;  Location: [facility];  Service: Urology;  Laterality: N/A;   TONSILLECTOMY AND ADENOIDECTOMY      Allergies  Allergen Reactions   Alendronate Sodium     upset stomach   Penicillins  Has patient had a PCN reaction causing immediate rash, facial/tongue/throat swelling, SOB or lightheadedness with hypotension: Yes Has patient had a PCN reaction causing severe rash involving mucus membranes or skin necrosis: No Has patient had a PCN reaction that required hospitalization: No Has patient had a PCN reaction occurring within the last 10 years: No If all of the above answers are "NO", then may proceed with Cephalosporin use.    Amoxicillin Rash    Has patient had a PCN  reaction causing immediate rash, facial/tongue/throat swelling, SOB or lightheadedness with hypotension: yes Has patient had a PCN reaction causing severe rash involving mucus membranes or skin necrosis: no Has patient had a PCN reaction that required hospitalization: no Has patient had PCN reaction within the last 10 years: no  If all of the above answers are "NO", then may proceed with Cephalosporin use.  REACTION: unspecified   Metronidazole Other (See Comments)    Pounding headaches patient says was bad.  With cipro for diverticulitis rx. Other reaction(s): Other (See Comments) Pounding headaches patient says was bad.  With cipro for diverticulitis rx.    Allergies as of 05/11/2021       Reactions   Alendronate Sodium    upset stomach   Penicillins    Has patient had a PCN reaction causing immediate rash, facial/tongue/throat swelling, SOB or lightheadedness with hypotension: Yes Has patient had a PCN reaction causing severe rash involving mucus membranes or skin necrosis: No Has patient had a PCN reaction that required hospitalization: No Has patient had a PCN reaction occurring within the last 10 years: No If all of the above answers are "NO", then may proceed with Cephalosporin use.   Amoxicillin Rash   Has patient had a PCN reaction causing immediate rash, facial/tongue/throat swelling, SOB or lightheadedness with hypotension: yes Has patient had a PCN reaction causing severe rash involving mucus membranes or skin necrosis: no Has patient had a PCN reaction that required hospitalization: no Has patient had PCN reaction within the last 10 years: no  If all of the above answers are "NO", then may proceed with Cephalosporin use. REACTION: unspecified   Metronidazole Other (See Comments)   Pounding headaches patient says was bad.  With cipro for diverticulitis rx. Other reaction(s): Other (See Comments) Pounding headaches patient says was bad.  With cipro for diverticulitis rx.         Medication List        Accurate as of May 11, 2021  2:28 PM. If you have any questions, ask your nurse or doctor.          STOP taking these medications    azithromycin 250 MG tablet Commonly known as: ZITHROMAX Stopped by: Sandrea Hughs, NP       TAKE these medications    acetaminophen 500 MG tablet Commonly known as: TYLENOL Take 500 mg by mouth at bedtime as needed.   Align 4 MG Caps Take 1 capsule by mouth as needed.   calcium carbonate 1250 (500 Ca) MG chewable tablet Commonly known as: OS-CAL Chew 1 tablet by mouth daily.   diltiazem 120 MG 24 hr capsule Commonly known as: CARDIZEM CD TAKE 1 CAPSULE BY MOUTH EVERY DAY   Eliquis 5 MG Tabs tablet Generic drug: apixaban TAKE 1 TABLET BY MOUTH TWICE A DAY   escitalopram 5 MG tablet Commonly known as: LEXAPRO TAKE 1 TABLET BY MOUTH EVERYDAY AT BEDTIME   fluticasone 50 MCG/ACT nasal spray Commonly known as: FLONASE Place 2 sprays  into both nostrils daily.   guaiFENesin 600 MG 12 hr tablet Commonly known as: MUCINEX Take 600 mg by mouth daily as needed.   loratadine 10 MG tablet Commonly known as: CLARITIN TAKE 1 TABLET BY MOUTH EVERY DAY   multivitamin tablet Take 1 tablet by mouth daily.   polyethylene glycol powder 17 GM/SCOOP powder Commonly known as: GLYCOLAX/MIRALAX Take 17 g by mouth daily. Hold for loose stool   Premarin vaginal cream Generic drug: conjugated estrogens INSERT 1 GRAM VAGINALLY THREE TIMES A WEEKLY, THEN ONCE WEEKLY   Restasis 0.05 % ophthalmic emulsion Generic drug: cycloSPORINE Place 1 drop into both eyes 2 (two) times daily.   triamcinolone 55 MCG/ACT Aero nasal inhaler Commonly known as: NASACORT 2 SPRAYS INTO EACH NOSTRIL AT NIGHT   vitamin C 100 MG tablet Take 500 mg by mouth daily.        Review of Systems  Constitutional:  Negative for appetite change, chills, fatigue, fever and unexpected weight change.  HENT:  Negative for  congestion, dental problem, ear discharge, ear pain, facial swelling, hearing loss, nosebleeds, postnasal drip, rhinorrhea, sinus pressure, sinus pain, sneezing, sore throat, tinnitus and trouble swallowing.   Eyes:  Negative for pain, discharge, redness, itching and visual disturbance.  Respiratory:  Negative for cough, chest tightness, shortness of breath and wheezing.   Cardiovascular:  Negative for chest pain, palpitations and leg swelling.  Gastrointestinal:  Negative for abdominal distention, abdominal pain, blood in stool, constipation, diarrhea, nausea and vomiting.  Endocrine: Negative for cold intolerance, heat intolerance, polydipsia, polyphagia and polyuria.  Genitourinary:  Negative for difficulty urinating, dysuria, flank pain, frequency and urgency.  Musculoskeletal:  Negative for arthralgias, back pain, gait problem, joint swelling, myalgias, neck pain and neck stiffness.  Skin:  Negative for color change, pallor, rash and wound.  Neurological:  Negative for dizziness, syncope, speech difficulty, weakness, light-headedness, numbness and headaches.  Hematological:  Does not bruise/bleed easily.  Psychiatric/Behavioral:  Negative for agitation, behavioral problems, confusion, hallucinations, self-injury, sleep disturbance and suicidal ideas. The patient is not nervous/anxious.    Immunization History  Administered Date(s) Administered   Fluad Quad(high Dose 65+) 01/10/2020   Influenza Split 01/25/2011, 01/23/2012   Influenza Whole 12/31/2007   Influenza, High Dose Seasonal PF 01/19/2013, 01/10/2014, 01/17/2015, 01/18/2016, 01/02/2017, 01/05/2018, 01/05/2019, 01/05/2019, 02/07/2021   Influenza-Unspecified 01/02/2017   PFIZER(Purple Top)SARS-COV-2 Vaccination 05/07/2019, 06/02/2019, 01/31/2020, 08/02/2020   Pfizer Covid-19 Vaccine Bivalent Booster 41yr & up 02/19/2021   Pneumococcal Conjugate-13 02/23/2013   Pneumococcal Polysaccharide-23 04/01/2006, 04/01/2018   Td 04/01/2005    Td (Adult) 11/22/2016   Tdap 08/30/2011   Zoster Recombinat (Shingrix) 12/04/2016, 07/27/2017   Zoster, Live 04/02/2007   Pertinent  Health Maintenance Due  Topic Date Due   MAMMOGRAM  09/09/2022   INFLUENZA VACCINE  Completed   DEXA SCAN  Completed   Fall Risk 12/13/2020 03/02/2021 03/13/2021 03/29/2021 05/10/2021  Falls in the past year? 0 0 0 0 0  Was there an injury with Fall? 0 0 0 0 0  Was there an injury with Fall? - - - - -  Fall Risk Category Calculator 0 0 0 0 0  Fall Risk Category Low Low Low Low Low  Patient Fall Risk Level Low fall risk Low fall risk Low fall risk Low fall risk Low fall risk  Patient at Risk for Falls Due to No Fall Risks No Fall Risks No Fall Risks No Fall Risks No Fall Risks  Fall risk Follow up Falls evaluation completed Falls evaluation  completed Falls evaluation completed Falls evaluation completed Falls evaluation completed   Functional Status Survey:    Vitals:   05/11/21 1401  BP: 100/60  Pulse: 67  Resp: 16  Temp: (!) 97.1 F (36.2 C)  SpO2: 96%  Weight: 164 lb (74.4 kg)  Height: 5' (1.524 m)   Body mass index is 32.03 kg/m. Physical Exam Vitals reviewed.  Constitutional:      General: She is not in acute distress.    Appearance: Normal appearance. She is obese. She is not ill-appearing or diaphoretic.  HENT:     Head: Normocephalic.     Right Ear: Tympanic membrane, ear canal and external ear normal. There is no impacted cerumen.     Left Ear: Tympanic membrane, ear canal and external ear normal. There is no impacted cerumen.     Nose: Nose normal. No congestion or rhinorrhea.     Mouth/Throat:     Mouth: Mucous membranes are moist.     Pharynx: Oropharynx is clear. No oropharyngeal exudate or posterior oropharyngeal erythema.  Eyes:     General: No scleral icterus.       Right eye: No discharge.        Left eye: No discharge.     Extraocular Movements: Extraocular movements intact.     Conjunctiva/sclera: Conjunctivae normal.      Pupils: Pupils are equal, round, and reactive to light.  Neck:     Vascular: No carotid bruit.  Cardiovascular:     Rate and Rhythm: Normal rate and regular rhythm.     Pulses: Normal pulses.     Heart sounds: Normal heart sounds. No murmur heard.   No friction rub. No gallop.  Pulmonary:     Effort: Pulmonary effort is normal. No respiratory distress.     Breath sounds: Normal breath sounds. No wheezing, rhonchi or rales.  Chest:     Chest wall: No tenderness.  Abdominal:     General: Bowel sounds are normal. There is no distension.     Palpations: Abdomen is soft. There is no mass.     Tenderness: There is no abdominal tenderness. There is no right CVA tenderness, left CVA tenderness, guarding or rebound.  Musculoskeletal:        General: No swelling or tenderness. Normal range of motion.     Cervical back: Normal range of motion. No rigidity or tenderness.     Right lower leg: No edema.     Left lower leg: No edema.  Lymphadenopathy:     Cervical: No cervical adenopathy.  Skin:    General: Skin is warm and dry.     Coloration: Skin is not pale.     Findings: No bruising, erythema, lesion or rash.  Neurological:     Mental Status: She is alert and oriented to person, place, and time.     Cranial Nerves: No cranial nerve deficit.     Sensory: No sensory deficit.     Motor: No weakness.     Coordination: Coordination normal.     Gait: Gait normal.  Psychiatric:        Mood and Affect: Mood normal.        Speech: Speech normal.        Behavior: Behavior normal.        Thought Content: Thought content normal.        Judgment: Judgment normal.    Labs reviewed: Recent Labs    06/30/20 0000 11/03/20 0931 05/07/21 0921  NA  136 135 136  K 4.3 4.3 3.8  CL 102 100 101  CO2 23 26 25   GLUCOSE 85 82 86  BUN 9 9 6*  CREATININE 0.74 0.79 0.80  CALCIUM 9.0 9.0 9.4   Recent Labs    06/30/20 0000 11/03/20 0931 05/07/21 0921  AST 21 16 20   ALT 14 13 15   BILITOT  0.9 0.7 1.1  PROT 6.7 6.7 6.9   Recent Labs    10/11/20 1536 11/03/20 0931 05/07/21 0921  WBC 7.3 5.0 5.3  NEUTROABS 4,555 3,005 3,169  HGB 13.5 13.3 13.7  HCT 40.5 39.7 41.2  MCV 92.9 94.7 94.9  PLT 258 245 248   Lab Results  Component Value Date   TSH 5.50 (H) 05/07/2021   No results found for: HGBA1C Lab Results  Component Value Date   CHOL 200 (H) 05/07/2021   HDL 66 05/07/2021   LDLCALC 112 (H) 05/07/2021   LDLDIRECT 100.0 04/07/2019   TRIG 111 05/07/2021   CHOLHDL 3.0 05/07/2021    Significant Diagnostic Results in last 30 days:  No results found.  Assessment/Plan 1. Hyperlipidemia LDL goal <100 LDL 112 - continue on dietary modification and exercise  - Lipid panel; Future  2. Age-related osteoporosis without current pathological fracture previous bone density reviewed done 04/21/2019 Right Femur Neck T-score -2.7  - continue on Os-cal  daily   3. AF (paroxysmal atrial fibrillation) (HCC) HR controlled  Continue on Apixaban and diltiazem  - CBC with Differential/Platelet; Future - CMP with eGFR([facility]); Future  4. Abnormal TSH Lab Results  Component Value Date   TSH 5.50 (H) 05/07/2021  Continue to monitor  - TSH; Future  5. Anxiety Stable  Continue on escitalopram  - TSH; Future  Family/ staff Communication: Reviewed plan of care with patient and daughter verbalized understanding   Labs/tests ordered:  - CBC with Differential/Platelet - CMP with eGFR([facility]) - TSH - Lipid panel  Next Appointment :6 months for medical management of chronic issues.Fasting Labs prior to visit.     Sandrea Hughs, NP

## 2021-05-16 ENCOUNTER — Other Ambulatory Visit: Payer: Self-pay | Admitting: Cardiovascular Disease

## 2021-05-16 ENCOUNTER — Encounter: Payer: Self-pay | Admitting: Internal Medicine

## 2021-05-16 ENCOUNTER — Ambulatory Visit: Payer: Medicare Other | Admitting: Internal Medicine

## 2021-05-16 VITALS — BP 112/68 | HR 64 | Ht 60.0 in | Wt 165.0 lb

## 2021-05-16 DIAGNOSIS — N898 Other specified noninflammatory disorders of vagina: Secondary | ICD-10-CM

## 2021-05-16 DIAGNOSIS — K5909 Other constipation: Secondary | ICD-10-CM

## 2021-05-16 NOTE — Patient Instructions (Addendum)
Glad things are going well. Continue with the MiraLax and prune juice or prunes.  You can take extra MiraLax when needed.  Check with Ngetich, Dinah C, NPregarding the vaginal symptom.  I appreciate the opportunity to care for you. Gatha Mayer, MD, [facility]

## 2021-05-16 NOTE — Telephone Encounter (Signed)
Prescription refill request for Eliquis received. Indication: Afib  Last office visit: 05/18/20 Angelena Form)  Scr: 0.80(2/96/23)  Age: 88 Weight: 74.4kg  Appropriate dose and refill sent to requested pharmacy.

## 2021-05-16 NOTE — Progress Notes (Signed)
Ashley Lawson 88 y.o. 05/15/1932 [id number]  Assessment & Plan:   Encounter Diagnoses  Name Primary?   Chronic constipation Yes   Vaginal dryness     Continue MiraLAX and prune regimen.  She may take extra MiraLAX if needed.  Return as needed regarding this.  See primary care regarding vaginal dryness question she needs topical estrogen therapy.  CC: Ngetich, [name], NP    Subjective:   Chief Complaint: Follow-up of constipation  HPI 88 year old white woman with chronic constipation issues last seen in 2022 and was also seen several times in 2021 as she was having constipation issues she had some abdominal pain and bloating was treated with doxycycline which had helped her in the past and eventually ended up on a regimen of MiraLAX and prune juice.  She reports that she is doing well without problems at this time.  She tells me she gets "dry down there" and indicates that she is having some vaginal dryness that is bothering her somewhat.  She has not asked for help from any other providers.  She continues to perform her ADLs she has a daughter that lives with her, the patient is still driving though her son wants to take the car away. Allergies  Allergen Reactions   Alendronate Sodium     upset stomach   Penicillins     Has patient had a PCN reaction causing immediate rash, facial/tongue/throat swelling, SOB or lightheadedness with hypotension: Yes Has patient had a PCN reaction causing severe rash involving mucus membranes or skin necrosis: No Has patient had a PCN reaction that required hospitalization: No Has patient had a PCN reaction occurring within the last 10 years: No If all of the above answers are "NO", then may proceed with Cephalosporin use.    Amoxicillin Rash    Has patient had a PCN reaction causing immediate rash, facial/tongue/throat swelling, SOB or lightheadedness with hypotension: yes Has patient had a PCN reaction causing severe rash involving  mucus membranes or skin necrosis: no Has patient had a PCN reaction that required hospitalization: no Has patient had PCN reaction within the last 10 years: no  If all of the above answers are "NO", then may proceed with Cephalosporin use.  REACTION: unspecified   Metronidazole Other (See Comments)    Pounding headaches patient says was bad.  With cipro for diverticulitis rx. Other reaction(s): Other (See Comments) Pounding headaches patient says was bad.  With cipro for diverticulitis rx.   Current Meds  Medication Sig   acetaminophen (TYLENOL) 500 MG tablet Take 500 mg by mouth at bedtime as needed.   apixaban (ELIQUIS) 5 MG TABS tablet TAKE 1 TABLET BY MOUTH TWICE A DAY   Ascorbic Acid (VITAMIN C) 100 MG tablet Take 500 mg by mouth daily.   calcium carbonate (OS-CAL) 1250 (500 Ca) MG chewable tablet Chew 1 tablet by mouth daily.    diltiazem (CARDIZEM CD) 120 MG 24 hr capsule TAKE 1 CAPSULE BY MOUTH EVERY DAY   escitalopram (LEXAPRO) 5 MG tablet TAKE 1 TABLET BY MOUTH EVERYDAY AT BEDTIME   fluticasone (FLONASE) 50 MCG/ACT nasal spray Place 2 sprays into both nostrils daily.   guaiFENesin (MUCINEX) 600 MG 12 hr tablet Take 600 mg by mouth daily as needed.   loratadine (CLARITIN) 10 MG tablet TAKE 1 TABLET BY MOUTH EVERY DAY   Multiple Vitamin (MULTIVITAMIN) tablet Take 1 tablet by mouth daily.   polyethylene glycol powder (GLYCOLAX/MIRALAX) 17 GM/SCOOP powder Take 17 g by mouth  daily. Hold for loose stool   PREMARIN vaginal cream INSERT 1 GRAM VAGINALLY THREE TIMES A WEEKLY, THEN ONCE WEEKLY   Probiotic Product (ALIGN) 4 MG CAPS Take 1 capsule by mouth as needed.   RESTASIS 0.05 % ophthalmic emulsion Place 1 drop into both eyes 2 (two) times daily.   triamcinolone (NASACORT) 55 MCG/ACT AERO nasal inhaler 2 SPRAYS INTO EACH NOSTRIL AT NIGHT   Past Medical History:  Diagnosis Date   Abdominal pain 12/2009   hospitalized   Allergic rhinitis    Anxiety    Bladder problem    Chronic  constipation    Compression fx, lumbar spine (HCC)    DDD (degenerative disc disease), lumbar    Diverticulosis of colon    Dog bite(E906.0) 08/30/2011   Dysrhythmia    GERD (gastroesophageal reflux disease)    Hepatic steatosis    pt denies   Hyperlipidemia    Hyperplastic colon polyp    IBS (irritable bowel syndrome)    constipation predominant   Internal hemorrhoids    Osteoporosis    dexa 2011 -2.7 hip nl spine   PAF (paroxysmal atrial fibrillation) ([place])    a. dx 05/2017.   PMR (polymyalgia rheumatica) ([place]) 08/30/2011   under rheum care  on low dose pred 5 mg    Renal disorder    Past Surgical History:  Procedure Laterality Date   ABDOMINAL HYSTERECTOMY     with bladder tact and rectocele repair   BLADDER SURGERY     Streched   CATARACT EXTRACTION  2010   both   COLONOSCOPY  2016 was most recent   CYSTO WITH HYDRODISTENSION N/A 10/05/2019   Procedure: CYSTOSCOPY/HYDRODISTENSION;  Surgeon: Bjorn Loser, MD;  Location: [facility];  Service: Urology;  Laterality: N/A;   TONSILLECTOMY AND ADENOIDECTOMY     Social History   Social History Narrative   Retired   Regular exercise- yes   Widowed   At home with children and GKs    HH of 2     Puppy   Plays cards is social and active       Diet:  No      Do you drink/ eat things with caffeine? Yes      Marital status:   Widow                            What year were you married ? [id number],8811      Do you live in a house, apartment,assistred living, condo, trailer, etc.)? Townhouse      Is it one or more stories? 2      How many persons live in your home ? 2      Do you have any pets in your home ?(please list)  None      Highest Level of education completed:  12 th Grade      Current or past profession: Retail Sales @ [employer]      Do you exercise?    Some                          Type & how often  Walking      ADVANCED DIRECTIVES (Please bring copies)      Do you have a living will? Yes      Do you have a DNR  form?  Yes  If not, do you want to discuss one?       Do you have signed POA?HPOA forms? Yes                If so, please bring to your appointment      FUNCTIONAL STATUS- To be completed by Spouse / child / Staff       Do you have difficulty bathing or dressing yourself ? No      Do you have difficulty preparing food or eating ?  No      Do you have difficulty managing your mediation ? No      Do you have difficulty managing your finances ? No      Do you have difficulty affording your medication ? No      family history includes Alzheimer's disease (age of onset: 8) in her sister; Bladder Cancer (age of onset: 24) in her sister; Crohn's disease in her son; Heart attack (age of onset: 43) in her father; Liver disease (age of onset: 34) in her brother; Stroke (age of onset: 37) in her mother.   Review of Systems As above  Objective:   Physical Exam BP 112/68    Pulse 64    Ht 5' (1.524 m)    Wt 165 lb (74.8 kg)    BMI 32.22 kg/m

## 2021-06-20 DIAGNOSIS — J310 Chronic rhinitis: Secondary | ICD-10-CM | POA: Diagnosis not present

## 2021-06-20 DIAGNOSIS — J324 Chronic pansinusitis: Secondary | ICD-10-CM | POA: Diagnosis not present

## 2021-06-20 DIAGNOSIS — J343 Hypertrophy of nasal turbinates: Secondary | ICD-10-CM | POA: Diagnosis not present

## 2021-06-20 DIAGNOSIS — J342 Deviated nasal septum: Secondary | ICD-10-CM | POA: Diagnosis not present

## 2021-08-01 ENCOUNTER — Ambulatory Visit (INDEPENDENT_AMBULATORY_CARE_PROVIDER_SITE_OTHER): Payer: Medicare Other | Admitting: Family

## 2021-08-01 ENCOUNTER — Encounter: Payer: Self-pay | Admitting: Family

## 2021-08-01 VITALS — BP 130/80 | HR 71 | Temp 97.8°F | Ht 60.0 in

## 2021-08-01 DIAGNOSIS — K644 Residual hemorrhoidal skin tags: Secondary | ICD-10-CM | POA: Diagnosis not present

## 2021-08-01 DIAGNOSIS — I480 Paroxysmal atrial fibrillation: Secondary | ICD-10-CM | POA: Diagnosis not present

## 2021-08-01 DIAGNOSIS — K625 Hemorrhage of anus and rectum: Secondary | ICD-10-CM | POA: Diagnosis not present

## 2021-08-01 MED ORDER — HYDROCORTISONE 1 % EX CREA: 1.0000 "application " | TOPICAL_CREAM | Freq: Two times a day (BID) | CUTANEOUS | 0 refills | Status: DC

## 2021-08-01 NOTE — Progress Notes (Signed)
? ?Provider: Marlowe Sax FNP-C ? ?Kalen Neidert, [name], NP ? ?Patient Care Team: ?Aliyha Fornes, [name], NP as PCP - General (Family Medicine) ?Burnell Blanks, MD as PCP - Cardiology (Cardiology) ?Bjorn Loser, MD (Urology) ?Particia Nearing, MD (Dermatology) ?Luberta Mutter, MD (Ophthalmology) ?Almedia Balls, MD (Orthopedic Surgery) ?anderson  (Rheumatology) ?Jarome Matin, MD as Consulting Physician (Dermatology) ?Gatha Mayer, MD as Consulting Physician (Gastroenterology) ?Rozetta Nunnery, MD (Inactive) as Consulting Physician (Otolaryngology) ? ?Extended Emergency Contact Information ?Primary Emergency Contact: Apple,Karen ?Address: [address] ?         [place] [address] [address] ?Home Phone: [phone] ?Work Phone: [phone] ?Mobile Phone: [phone] ?Relation: Daughter ? ?Code Status:  DNR ?Goals of care: Advanced Directive information ? ?  05/10/2021  ?  4:52 PM  ?Advanced Directives  ?Does Patient Have a Medical Advance Directive? Yes  ?Type of Paramedic of [place];Living will  ?Does patient want to make changes to medical advance directive? No - Patient declined  ?Copy of [place] in Chart? Yes - validated most recent copy scanned in chart (See row information)  ? ? ? ?Chief Complaint  ?Patient presents with  ? Acute Visit  ?  Patient has been having rectal bleeding for several days. Patient states that it started back today. She had it for about 2 days. Slight abdominal pain at times. Patient takes milk of magnesia and miralax at times.   ? ? ?HPI:  ?Pt is a 88 y.o. female seen today for an acute visit for evaluation of rectal bleeding for several days.she is here with her daughter.states had nose bleed x 2 days.Had abdominal pain but miralax effective.Not sure if hemorrhoids were irritated by bowels. Bleeding has stopped. She is currently on Eliquis for Afib. ? ? ?Past Medical History:  ?Diagnosis Date  ? Abdominal  pain 12/2009  ? hospitalized  ? Allergic rhinitis   ? Anxiety   ? Bladder problem   ? Chronic constipation   ? Compression fx, lumbar spine ([facility])   ? DDD (degenerative disc disease), lumbar   ? Diverticulosis of colon   ? Dog bite(E906.0) 08/30/2011  ? Dysrhythmia   ? GERD (gastroesophageal reflux disease)   ? Hepatic steatosis   ? pt denies  ? Hyperlipidemia   ? Hyperplastic colon polyp   ? IBS (irritable bowel syndrome)   ? constipation predominant  ? Internal hemorrhoids   ? Osteoporosis   ? dexa 2011 -2.7 hip nl spine  ? PAF (paroxysmal atrial fibrillation) ([place])   ? a. dx 05/2017.  ? PMR (polymyalgia rheumatica) ([place]) 08/30/2011  ? under rheum care  on low dose pred 5 mg   ? Renal disorder   ? ?Past Surgical History:  ?Procedure Laterality Date  ? ABDOMINAL HYSTERECTOMY    ? with bladder tact and rectocele repair  ? BLADDER SURGERY    ? Streched  ? CATARACT EXTRACTION  2010  ? both  ? COLONOSCOPY  2016 was most recent  ? CYSTO WITH HYDRODISTENSION N/A 10/05/2019  ? Procedure: CYSTOSCOPY/HYDRODISTENSION;  Surgeon: Bjorn Loser, MD;  Location: [facility];  Service: Urology;  Laterality: N/A;  ? TONSILLECTOMY AND ADENOIDECTOMY    ? ? ?Allergies  ?Allergen Reactions  ? Alendronate Sodium   ?  upset stomach  ? Penicillins   ?  Has patient had a PCN reaction causing immediate rash, facial/tongue/throat swelling, SOB or lightheadedness with hypotension: Yes ?Has patient had a PCN reaction causing severe rash involving mucus  membranes or skin necrosis: No ?Has patient had a PCN reaction that required hospitalization: No ?Has patient had a PCN reaction occurring within the last 10 years: No ?If all of the above answers are "NO", then may proceed with Cephalosporin use. ?  ? Amoxicillin Rash  ?  Has patient had a PCN reaction causing immediate rash, facial/tongue/throat swelling, SOB or lightheadedness with hypotension: yes ?Has patient had a PCN reaction causing severe rash involving mucus membranes or skin necrosis:  no ?Has patient had a PCN reaction that required hospitalization: no ?Has patient had PCN reaction within the last 10 years: no  ?If all of the above answers are "NO", then may proceed with Cephalosporin use. ? ?REACTION: unspecified  ? Metronidazole Other (See Comments)  ?  Pounding headaches patient says was bad.  With cipro for diverticulitis rx. ?Other reaction(s): Other (See Comments) ?Pounding headaches patient says was bad.  With cipro for diverticulitis rx.  ? ? ?Outpatient Encounter Medications as of 08/01/2021  ?Medication Sig  ? acetaminophen (TYLENOL) 500 MG tablet Take 500 mg by mouth at bedtime as needed.  ? apixaban (ELIQUIS) 5 MG TABS tablet TAKE 1 TABLET BY MOUTH TWICE A DAY  ? Ascorbic Acid (VITAMIN C) 100 MG tablet Take 500 mg by mouth daily.  ? calcium carbonate (OS-CAL) 1250 (500 Ca) MG chewable tablet Chew 1 tablet by mouth daily.   ? diltiazem (CARDIZEM CD) 120 MG 24 hr capsule TAKE 1 CAPSULE BY MOUTH EVERY DAY  ? escitalopram (LEXAPRO) 5 MG tablet TAKE 1 TABLET BY MOUTH EVERYDAY AT BEDTIME  ? fluticasone (FLONASE) 50 MCG/ACT nasal spray Place 2 sprays into both nostrils daily.  ? guaiFENesin (MUCINEX) 600 MG 12 hr tablet Take 600 mg by mouth daily as needed.  ? loratadine (CLARITIN) 10 MG tablet TAKE 1 TABLET BY MOUTH EVERY DAY  ? Multiple Vitamin (MULTIVITAMIN) tablet Take 1 tablet by mouth daily.  ? polyethylene glycol powder (GLYCOLAX/MIRALAX) 17 GM/SCOOP powder Take 17 g by mouth daily. Hold for loose stool  ? PREMARIN vaginal cream INSERT 1 GRAM VAGINALLY THREE TIMES A WEEKLY, THEN ONCE WEEKLY  ? Probiotic Product (ALIGN) 4 MG CAPS Take 1 capsule by mouth as needed.  ? RESTASIS 0.05 % ophthalmic emulsion Place 1 drop into both eyes 2 (two) times daily.  ? [DISCONTINUED] triamcinolone (NASACORT) 55 MCG/ACT AERO nasal inhaler 2 SPRAYS INTO EACH NOSTRIL AT NIGHT  ? ?No facility-administered encounter medications on file as of 08/01/2021.  ? ? ?Review of Systems  ?Constitutional:  Negative for  appetite change, chills, fatigue, fever and unexpected weight change.  ?HENT:  Negative for congestion, dental problem, ear discharge, ear pain, facial swelling, hearing loss, postnasal drip, rhinorrhea, sinus pressure, sinus pain, sneezing, sore throat, tinnitus and trouble swallowing.   ?     Had nose bleeds x 2 days   ?Eyes:  Negative for pain, discharge, redness, itching and visual disturbance.  ?Respiratory:  Negative for cough, chest tightness, shortness of breath and wheezing.   ?Cardiovascular:  Negative for chest pain, palpitations and leg swelling.  ?Gastrointestinal:  Negative for abdominal distention, abdominal pain, blood in stool, diarrhea, nausea and vomiting.  ?     Hemorrhoids ?Miralax effective for constipation   ?  ?Genitourinary:  Negative for difficulty urinating, dysuria, flank pain, frequency and urgency.  ?Skin:  Negative for color change, pallor, rash and wound.  ?Neurological:  Negative for dizziness, syncope, weakness, light-headedness, numbness and headaches.  ?Hematological:  Does not bruise/bleed easily.  ? ?  Immunization History  ?Administered Date(s) Administered  ? Fluad Quad(high Dose 65+) 01/10/2020  ? Influenza Split 01/25/2011, 01/23/2012  ? Influenza Whole 12/31/2007  ? Influenza, High Dose Seasonal PF 01/19/2013, 01/10/2014, 01/17/2015, 01/18/2016, 01/02/2017, 01/05/2018, 01/05/2019, 01/05/2019, 02/07/2021  ? Influenza-Unspecified 01/02/2017  ? PFIZER(Purple Top)SARS-COV-2 Vaccination 05/07/2019, 06/02/2019, 01/31/2020, 08/02/2020  ? Pension scheme manager 43yr & up 02/19/2021  ? Pneumococcal Conjugate-13 02/23/2013  ? Pneumococcal Polysaccharide-23 04/01/2006, 04/01/2018  ? Td 04/01/2005  ? Td (Adult) 11/22/2016  ? Tdap 08/30/2011  ? Zoster Recombinat (Shingrix) 12/04/2016, 07/27/2017  ? Zoster, Live 04/02/2007  ? ?Pertinent  Health Maintenance Due  ?Topic Date Due  ? INFLUENZA VACCINE  10/30/2021  ? MAMMOGRAM  09/09/2022  ? DEXA SCAN  Completed  ? ? ?   12/13/2020  ?  3:08 PM 03/02/2021  ? 10:37 AM 03/13/2021  ?  2:45 PM 03/29/2021  ?  2:33 PM 05/10/2021  ?  4:52 PM  ?Fall Risk  ?Falls in the past year? 0 0 0 0 0  ?Was there an injury with Fall? 0 0 0 0 0  ?Fall Risk

## 2021-08-01 NOTE — Patient Instructions (Addendum)
Hold Eliquis 5 mg tablet for three days due to rectal bleeding then restart. ? ?Hemorrhoids ?Hemorrhoids are swollen veins that may develop: ?In the butt (rectum). These are called internal hemorrhoids. ?Around the opening of the butt (anus). These are called external hemorrhoids. ?Hemorrhoids can cause pain, itching, or bleeding. Most of the time, they do not cause serious problems. They usually get better with diet changes, lifestyle changes, and other home treatments. ?What are the causes? ?This condition may be caused by: ?Having trouble pooping (constipation). ?Pushing hard (straining) to poop. ?Watery poop (diarrhea). ?Pregnancy. ?Being very overweight (obese). ?Sitting for long periods of time. ?Heavy lifting or other activity that causes you to strain. ?Anal sex. ?Riding a bike for a long period of time. ?What are the signs or symptoms? ?Symptoms of this condition include: ?Pain. ?Itching or soreness in the butt. ?Bleeding from the butt. ?Leaking poop. ?Swelling in the area. ?One or more lumps around the opening of your butt. ?How is this diagnosed? ?A doctor can often diagnose this condition by looking at the affected area. The doctor may also: ?Do an exam that involves feeling the area with a gloved hand (digital rectal exam). ?Examine the area inside your butt using a small tube (anoscope). ?Order blood tests. This may be done if you have lost a lot of blood. ?Have you get a test that involves looking inside the colon using a flexible tube with a camera on the end (sigmoidoscopy or colonoscopy). ?How is this treated? ?This condition can usually be treated at home. Your doctor may tell you to change what you eat, make lifestyle changes, or try home treatments. If these do not help, procedures can be done to remove the hemorrhoids or make them smaller. These may involve: ?Placing rubber bands at the base of the hemorrhoids to cut off their blood supply. ?Injecting medicine into the hemorrhoids to shrink  them. ?Shining a type of light energy onto the hemorrhoids to cause them to fall off. ?Doing surgery to remove the hemorrhoids or cut off their blood supply. ?Follow these instructions at home: ?Eating and drinking ? ?Eat foods that have a lot of fiber in them. These include whole grains, beans, nuts, fruits, and vegetables. ?Ask your doctor about taking products that have added fiber (fibersupplements). ?Reduce the amount of fat in your diet. You can do this by: ?Eating low-fat dairy products. ?Eating less red meat. ?Avoiding processed foods. ?Drink enough fluid to keep your pee (urine) pale yellow. ?Managing pain and swelling ? ?Take a warm-water bath (sitz bath) for 20 minutes to ease pain. Do this 3-4 times a day. You may do this in a bathtub or using a portable sitz bath that fits over the toilet. ?If told, put ice on the painful area. It may be helpful to use ice between your warm baths. ?Put ice in a plastic bag. ?Place a towel between your skin and the bag. ?Leave the ice on for 20 minutes, 2-3 times a day. ?General instructions ?Take over-the-counter and prescription medicines only as told by your doctor. ?Medicated creams and medicines may be used as told. ?Exercise often. Ask your doctor how much and what kind of exercise is best for you. ?Go to the bathroom when you have the urge to poop. Do not wait. ?Avoid pushing too hard when you poop. ?Keep your butt dry and clean. Use wet toilet paper or moist towelettes after pooping. ?Do not sit on the toilet for a long time. ?Keep all follow-up visits as  told by your doctor. This is important. ?Contact a doctor if you: ?Have pain and swelling that do not get better with treatment or medicine. ?Have trouble pooping. ?Cannot poop. ?Have pain or swelling outside the area of the hemorrhoids. ?Get help right away if you have: ?Bleeding that will not stop. ?Summary ?Hemorrhoids are swollen veins in the butt or around the opening of the butt. ?They can cause pain,  itching, or bleeding. ?Eat foods that have a lot of fiber in them. These include whole grains, beans, nuts, fruits, and vegetables. ?Take a warm-water bath (sitz bath) for 20 minutes to ease pain. Do this 3-4 times a day. ?This information is not intended to replace advice given to you by your health care provider. Make sure you discuss any questions you have with your health care provider. ?Document Revised: 09/27/2020 Document Reviewed: 09/27/2020 ?Elsevier Patient Education ? [place]. ? ?

## 2021-08-02 ENCOUNTER — Telehealth: Payer: Self-pay | Admitting: Family

## 2021-08-02 NOTE — Telephone Encounter (Signed)
Ashley Lawson has had nose bleed and reported rectal bleeding described as bright red blood.Hemoccult was negative yesterday but had external hemorrhoids which I suspected was irritated by her bowels.No bleeding noted during visit .she was advised to hold Eliquis 5 mg tablet for 3 days then restart.I advised her to schedule appointment with you for follow up Afib.she was last seen 05/18/2020.  ? ?

## 2021-08-03 ENCOUNTER — Emergency Department (HOSPITAL_COMMUNITY)
Admission: EM | Admit: 2021-08-03 | Discharge: 2021-08-03 | Disposition: A | Discharge status: Home / Self Care | Payer: Medicare Other | Attending: Emergency Medicine | Admitting: Emergency Medicine

## 2021-08-03 ENCOUNTER — Emergency Department (HOSPITAL_COMMUNITY): Payer: Medicare Other

## 2021-08-03 ENCOUNTER — Encounter (HOSPITAL_COMMUNITY): Payer: Self-pay

## 2021-08-03 ENCOUNTER — Other Ambulatory Visit: Payer: Self-pay

## 2021-08-03 DIAGNOSIS — W2105XA Struck by basketball, initial encounter: Secondary | ICD-10-CM | POA: Diagnosis not present

## 2021-08-03 DIAGNOSIS — S20221A Contusion of right back wall of thorax, initial encounter: Secondary | ICD-10-CM | POA: Diagnosis not present

## 2021-08-03 DIAGNOSIS — S0083XA Contusion of other part of head, initial encounter: Secondary | ICD-10-CM | POA: Diagnosis not present

## 2021-08-03 DIAGNOSIS — S20211A Contusion of right front wall of thorax, initial encounter: Secondary | ICD-10-CM | POA: Diagnosis not present

## 2021-08-03 DIAGNOSIS — Z23 Encounter for immunization: Secondary | ICD-10-CM | POA: Insufficient documentation

## 2021-08-03 DIAGNOSIS — M2578 Osteophyte, vertebrae: Secondary | ICD-10-CM | POA: Diagnosis not present

## 2021-08-03 DIAGNOSIS — S199XXA Unspecified injury of neck, initial encounter: Secondary | ICD-10-CM | POA: Diagnosis not present

## 2021-08-03 DIAGNOSIS — M7989 Other specified soft tissue disorders: Secondary | ICD-10-CM | POA: Diagnosis not present

## 2021-08-03 DIAGNOSIS — S0990XA Unspecified injury of head, initial encounter: Secondary | ICD-10-CM

## 2021-08-03 DIAGNOSIS — T148XXA Other injury of unspecified body region, initial encounter: Secondary | ICD-10-CM

## 2021-08-03 DIAGNOSIS — I700 Atherosclerosis of aorta: Secondary | ICD-10-CM | POA: Diagnosis not present

## 2021-08-03 DIAGNOSIS — Y9367 Activity, basketball: Secondary | ICD-10-CM | POA: Insufficient documentation

## 2021-08-03 DIAGNOSIS — S40811A Abrasion of right upper arm, initial encounter: Secondary | ICD-10-CM | POA: Diagnosis not present

## 2021-08-03 DIAGNOSIS — R0781 Pleurodynia: Secondary | ICD-10-CM | POA: Diagnosis not present

## 2021-08-03 DIAGNOSIS — Z7901 Long term (current) use of anticoagulants: Secondary | ICD-10-CM | POA: Insufficient documentation

## 2021-08-03 DIAGNOSIS — M47812 Spondylosis without myelopathy or radiculopathy, cervical region: Secondary | ICD-10-CM | POA: Diagnosis not present

## 2021-08-03 MED ORDER — TETANUS-DIPHTH-ACELL PERTUSSIS 5-2.5-18.5 LF-MCG/0.5 IM SUSY
0.5000 mL | PREFILLED_SYRINGE | Freq: Once | INTRAMUSCULAR | Status: AC
  Administered 2021-08-03: 0.5 mL via INTRAMUSCULAR
  Filled 2021-08-03: qty 0.5

## 2021-08-03 NOTE — ED Provider Notes (Signed)
?  Face-to-face evaluation ? ? ?History: She is presenting for evaluation of injuries from fall.  She bumped her head right shoulder and her right chest, when she fell after shooting a basketball today.  She is currently on hiatus with Eliquis due to recent rectal bleeding.  She has had a single episode of dark stool after taking Pepto-Bismol. ? ?Physical exam: Alert elderly female who is calm comfortable.  Right lower quadrant abdomen is nontender to palpation.  There is no dysarthria or aphasia.  Small contusion on right hand. ? ?MDM: Evaluation for  ?Chief Complaint  ?Patient presents with  ? Fall  ? Head Injury  ?  ? ?Patient with injuries after ground-level fall.  She is currently on hold from Eliquis, plan to restart tomorrow.  No evidence for significant injuries on imaging.  She is stable for discharge.  With close monitoring.  Her daughter lives with her and will keep an eye on her.  We instructed when to initiate Eliquis therapy and what to watch for.  Plan to start it again tomorrow if the patient is doing well. ? ?Medical screening examination/treatment/procedure(s) were conducted as a shared visit with non-physician practitioner(s) and myself.  I personally evaluated the patient during the encounter ? ?  ?Daleen Bo, MD ?08/03/21 1631 ? ?

## 2021-08-03 NOTE — Discharge Instructions (Signed)
Get help right away if: ?You have: ?A severe headache that is not helped by medicine. ?Trouble walking or weakness in your arms and legs. ?Clear or bloody fluid coming from your nose or ears. ?Changes in your vision. ?A seizure. ?Increased confusion or irritability. ?Your symptoms get worse. ?You are sleepier than normal and have trouble staying awake. ?You lose your balance. ?Your pupils change size. ?Your speech is slurred. ?Your dizziness gets worse. ?You vomit. ?

## 2021-08-03 NOTE — ED Triage Notes (Signed)
Patient was trying to shoot a basketball and fell over hitting her head on cement. Patient also c/o right rib cage pain. ? ?Patient was taking Eliquis ,but was told to hold it for 3 days because she had bleeding hemorrhoids. Patient took her last dose 2 days ago. Patient had a black stool this AM. ?

## 2021-08-03 NOTE — ED Provider Triage Note (Signed)
Emergency Medicine Provider Triage Evaluation Note ? ?Ashley Lawson , a 88 y.o. female  was evaluated in triage.  Pt complains of fall. Patient was shooting a BBall, lost her balance fell onto right side. Hit her head and c/o R rib pain and arm pain. On eliquis. She has not taken meds for the past 2 days due to bleeding hemorrhoids. ? ?Review of Systems  ?Positive: Head injury ?Negative: vomiting ? ?Physical Exam  ?BP 126/73 (BP Location: Left Arm)   Pulse 77   Temp 98 ?F (36.7 ?C) (Oral)   Resp 18   Ht 5' (1.524 m)   Wt 74.4 kg   SpO2 98%   BMI 32.03 kg/m?  ?Gen:   Awake, no distress   ?Resp:  Normal effort  ?MSK:   Moves extremities without difficulty  ?Other:  Head injury ? ?Medical Decision Making  ?Medically screening exam initiated at 2:36 PM.  Appropriate orders placed.  Ashley Lawson was informed that the remainder of the evaluation will be completed by another provider, this initial triage assessment does not replace that evaluation, and the importance of remaining in the ED until their evaluation is complete. ? ?Work up inititated/ ?  ?Margarita Mail, PA-C ?08/03/21 1437 ? ?

## 2021-08-03 NOTE — ED Provider Notes (Signed)
?[facility] DEPT ?Provider Note ? ? ?CSN: [id number] ?Arrival date & time: 08/03/21  1357 ? ?  ? ?History ? ?Chief Complaint  ?Patient presents with  ? Fall  ? Head Injury  ? ? ?Ashley Lawson is a 88 y.o. female. ? ?88 y/o F on eliquis presents for evaluation of injury after shooting a basketball and falling.  ?She has been off of her eliquis for 2 days due to bleeding hemorrhoids. She hit her head, R rib cage, and arm. Abrasions noted, unsure of last TDAP. She denies sever headache, loc, nausea or vomiting. She has pain int the R rib cage ? ?The history is provided by the patient and a relative. No language interpreter was used.  ?Fall ?This is a new problem. The current episode started 1 to 2 hours ago. Associated symptoms include chest pain. Associated symptoms comments: Head injury, rib injury. Nothing aggravates the symptoms. Nothing relieves the symptoms.  ?Head Injury ? ?  ? ?Home Medications ?Prior to Admission medications   ?Medication Sig Start Date End Date Taking? Authorizing Provider  ?acetaminophen (TYLENOL) 500 MG tablet Take 500 mg by mouth at bedtime as needed.    [provider]  ?apixaban (ELIQUIS) 5 MG TABS tablet TAKE 1 TABLET BY MOUTH TWICE A DAY 05/16/21   Burnell Blanks, MD  ?Ascorbic Acid (VITAMIN C) 100 MG tablet Take 500 mg by mouth daily.    [provider]  ?calcium carbonate (OS-CAL) 1250 (500 Ca) MG chewable tablet Chew 1 tablet by mouth daily.     [provider]  ?diltiazem (CARDIZEM CD) 120 MG 24 hr capsule TAKE 1 CAPSULE BY MOUTH EVERY DAY 09/07/20   Burnell Blanks, MD  ?escitalopram (LEXAPRO) 5 MG tablet TAKE 1 TABLET BY MOUTH EVERYDAY AT BEDTIME 03/09/21   Ngetich, Dinah C, NP  ?fluticasone (FLONASE) 50 MCG/ACT nasal spray Place 2 sprays into both nostrils daily. 03/08/21   Lauree Chandler, NP  ?guaiFENesin (MUCINEX) 600 MG 12 hr tablet Take 600 mg by mouth daily as needed.    [provider]   ?hydrocortisone cream (PREPARATION H) 1 % Apply 1 application. topically 2 (two) times daily. 08/01/21   Ngetich, Dinah C, NP  ?loratadine (CLARITIN) 10 MG tablet TAKE 1 TABLET BY MOUTH EVERY DAY 04/20/21   Ngetich, [name], NP  ?Multiple Vitamin (MULTIVITAMIN) tablet Take 1 tablet by mouth daily.    [provider]  ?polyethylene glycol powder (GLYCOLAX/MIRALAX) 17 GM/SCOOP powder Take 17 g by mouth daily. Hold for loose stool 04/26/20   Ngetich, [name], NP  ?PREMARIN vaginal cream INSERT 1 GRAM VAGINALLY THREE TIMES A WEEKLY, THEN ONCE WEEKLY 08/23/19   [provider]  ?Probiotic Product (ALIGN) 4 MG CAPS Take 1 capsule by mouth as needed.    [provider]  ?RESTASIS 0.05 % ophthalmic emulsion Place 1 drop into both eyes 2 (two) times daily. 10/26/14   [provider]  ?   ? ?Allergies    ?Alendronate sodium, Penicillins, Amoxicillin, and Metronidazole   ? ?Review of Systems   ?Review of Systems  ?Cardiovascular:  Positive for chest pain.  ? ?Physical Exam ?Updated Vital Signs ?BP 126/73 (BP Location: Left Arm)   Pulse 77   Temp 98 ?F (36.7 ?C) (Oral)   Resp 18   Ht 5' (1.524 m)   Wt 74.4 kg   SpO2 98%   BMI 32.03 kg/m?  ?Physical Exam ?Vitals and nursing note reviewed.  ?Constitutional:   ?  General: She is not in acute distress. ?   Appearance: She is well-developed. She is not diaphoretic.  ?HENT:  ?   Head: Normocephalic.  ?   Comments: Hematoma of R forehead ?   Right Ear: External ear normal.  ?   Left Ear: External ear normal.  ?   Nose: Nose normal.  ?   Mouth/Throat:  ?   Mouth: Mucous membranes are moist.  ?Eyes:  ?   General: No scleral icterus. ?   Extraocular Movements: Extraocular movements intact.  ?   Conjunctiva/sclera: Conjunctivae normal.  ?   Pupils: Pupils are equal, round, and reactive to light.  ?Cardiovascular:  ?   Rate and Rhythm: Normal rate and regular rhythm.  ?   Heart sounds: Normal heart sounds. No murmur heard. ?  No friction rub. No  gallop.  ?Pulmonary:  ?   Effort: Pulmonary effort is normal. No respiratory distress.  ?   Breath sounds: Normal breath sounds.  ?Chest:  ?   Chest wall: Tenderness present. No deformity, swelling or crepitus.  ? ? ?Abdominal:  ?   General: Bowel sounds are normal. There is no distension.  ?   Palpations: Abdomen is soft. There is no mass.  ?   Tenderness: There is no abdominal tenderness. There is no guarding.  ?Musculoskeletal:     ?   General: No swelling or deformity.  ?   Cervical back: Normal range of motion.  ?Skin: ?   General: Skin is warm and dry.  ?   Comments: Abrasion to RUE  ?Neurological:  ?   Mental Status: She is alert and oriented to person, place, and time.  ?Psychiatric:     ?   Behavior: Behavior normal.  ? ? ?ED Results / Procedures / Treatments   ?Labs ?(all labs ordered are listed, but only abnormal results are displayed) ?Labs Reviewed - No data to display ? ?EKG ?None ? ?Radiology ?DG Ribs Unilateral W/Chest Right ? ?Result Date: 08/03/2021 ?CLINICAL DATA:  Fall onto right chest.  Right rib pain. EXAM: RIGHT RIBS AND CHEST - 3+ VIEW COMPARISON:  Chest radiograph on 05/02/2017 FINDINGS: No fracture or other bone lesions are seen involving the ribs. There is no evidence of pneumothorax or pleural effusion. Both lungs are clear. Heart size and mediastinal contours are within normal limits. Aortic atherosclerotic calcification noted. IMPRESSION: Negative. Electronically Signed   By: Marlaine Hind M.D.   On: 08/03/2021 15:20  ? ?CT Head Wo Contrast ? ?Result Date: 08/03/2021 ?CLINICAL DATA:  Head trauma EXAM: CT HEAD WITHOUT CONTRAST TECHNIQUE: Contiguous axial images were obtained from the base of the skull through the vertex without intravenous contrast. RADIATION DOSE REDUCTION: This exam was performed according to the departmental dose-optimization program which includes automated exposure control, adjustment of the mA and/or kV according to patient size and/or use of iterative reconstruction  technique. COMPARISON:  CT head 06/17/2019 FINDINGS: Brain: No acute intracranial hemorrhage, mass effect, or herniation. No extra-axial fluid collections. No evidence of acute territorial infarct. No hydrocephalus. Moderate cortical volume loss. Patchy hypodensities in the periventricular and subcortical white matter, likely secondary to chronic microvascular ischemic changes. Vascular: No hyperdense vessel or unexpected calcification. Skull: Normal. Negative for fracture or focal lesion. Sinuses/Orbits: No acute finding. Other: Mild soft tissue swelling in the right frontal scalp. IMPRESSION: 1. No acute intracranial process identified. Chronic changes as described. 2. Mild soft tissue swelling of the right frontal scalp. Electronically Signed   By: Ofilia Neas  M.D.   On: 08/03/2021 15:02  ? ?CT Cervical Spine Wo Contrast ? ?Result Date: 08/03/2021 ?CLINICAL DATA:  Head trauma EXAM: CT CERVICAL SPINE WITHOUT CONTRAST TECHNIQUE: Multidetector CT imaging of the cervical spine was performed without intravenous contrast. Multiplanar CT image reconstructions were also generated. RADIATION DOSE REDUCTION: This exam was performed according to the departmental dose-optimization program which includes automated exposure control, adjustment of the mA and/or kV according to patient size and/or use of iterative reconstruction technique. COMPARISON:  None Available. FINDINGS: Alignment: Normal. Skull base and vertebrae: No acute fracture. No primary bone lesion or focal pathologic process. Soft tissues and spinal canal: No prevertebral fluid or swelling. No visible canal hematoma. Disc levels: Moderate to severe intervertebral disc space narrowing at C5-C6 and C6-C7. Multilevel uncovertebral spurring and dorsal endplate osteophytes most prominent from C4-C6. Associated bilateral neural foraminal narrowing. Mild bilateral facet arthropathy. Upper chest: No acute process identified. Other: None. IMPRESSION: No acute fracture  or malalignment identified. Chronic degenerative changes. Electronically Signed   By: Ofilia Neas M.D.   On: 08/03/2021 15:05   ? ?Procedures ?Procedures  ? ? ?Medications Ordered in ED ?Medications  ?Tdap (BOOSTRI

## 2021-08-08 ENCOUNTER — Encounter: Payer: Self-pay | Admitting: Family

## 2021-08-08 ENCOUNTER — Ambulatory Visit (INDEPENDENT_AMBULATORY_CARE_PROVIDER_SITE_OTHER): Payer: Medicare Other | Admitting: Family

## 2021-08-08 VITALS — BP 112/68 | HR 65 | Temp 97.1°F

## 2021-08-08 DIAGNOSIS — R102 Pelvic and perineal pain: Secondary | ICD-10-CM

## 2021-08-08 DIAGNOSIS — W19XXXD Unspecified fall, subsequent encounter: Secondary | ICD-10-CM

## 2021-08-08 DIAGNOSIS — M25521 Pain in right elbow: Secondary | ICD-10-CM | POA: Diagnosis not present

## 2021-08-08 LAB — CBC WITH DIFFERENTIAL/PLATELET
Absolute Monocytes: 874 cells/uL (ref 200–950)
Basophils Absolute: 105 cells/uL (ref 0–200)
Basophils Relative: 1.1 %
Eosinophils Absolute: 285 cells/uL (ref 15–500)
Eosinophils Relative: 3 %
HCT: 39.6 % (ref 35.0–45.0)
Hemoglobin: 12.9 g/dL (ref 11.7–15.5)
Lymphs Abs: 1891 cells/uL (ref 850–3900)
MCH: 30.7 pg (ref 27.0–33.0)
MCHC: 32.6 g/dL (ref 32.0–36.0)
MCV: 94.3 fL (ref 80.0–100.0)
MPV: 9 fL (ref 7.5–12.5)
Monocytes Relative: 9.2 %
Neutro Abs: 6346 cells/uL (ref 1500–7800)
Neutrophils Relative %: 66.8 %
Platelets: 281 10*3/uL (ref 140–400)
RBC: 4.2 10*6/uL (ref 3.80–5.10)
RDW: 12.1 % (ref 11.0–15.0)
Total Lymphocyte: 19.9 %
WBC: 9.5 10*3/uL (ref 3.8–10.8)

## 2021-08-08 NOTE — Progress Notes (Signed)
? ?Provider: Marlowe Sax FNP-C ? ?Drue Harr, [name], NP ? ?Patient Care Team: ?Manville Rico, [name], NP as PCP - General (Family Medicine) ?Burnell Blanks, MD as PCP - Cardiology (Cardiology) ?Bjorn Loser, MD (Urology) ?Particia Nearing, MD (Dermatology) ?Luberta Mutter, MD (Ophthalmology) ?Almedia Balls, MD (Orthopedic Surgery) ?anderson  (Rheumatology) ?Jarome Matin, MD as Consulting Physician (Dermatology) ?Gatha Mayer, MD as Consulting Physician (Gastroenterology) ?Rozetta Nunnery, MD (Inactive) as Consulting Physician (Otolaryngology) ? ?Extended Emergency Contact Information ?Primary Emergency Contact: Apple,Karen ?Address: [place] ?         [place] [address] [address] ?Home Phone: [phone] ?Work Phone: [phone] ?Mobile Phone: [phone] ?Relation: Daughter ? ?Code Status:Full code  ?Goals of care: Advanced Directive information ? ?  08/08/2021  ?  2:12 PM  ?Advanced Directives  ?Does Patient Have a Medical Advance Directive? Yes  ?Type of Paramedic of [place];Living will  ?Does patient want to make changes to medical advance directive? No - Patient declined  ?Copy of [place] in Chart? Yes - validated most recent copy scanned in chart (See row information)  ? ? ? ?Chief Complaint  ?Patient presents with  ? Hospitalization Follow-up  ?  ER Follow Up from fall 08/03/2021. Patient hit head, right rib cage, and arm. Weight not recorded due to defected scales which are in process of being replaced. Patient c/o abdominal/lower stomach pain.  ?  ? ? ?HPI:  ?Pt is a 88 y.o. female seen today for an acute visit for ED follow-up for fall on 08/03/2021.  Stated hit her head, right hip gauge and when she was playing basketball with her granddaughter tried to school but lost balance and fell on the concrete.  She did not lose consciousness but was assisted up by her daughter and other standby.  She was off her Eliquis for  the past 3 days due to previous nosebleed and hemorrhoids but was seen recently and was advised to hold her Eliquis for 3 days she was supposed to have restarted her Eliquis the following day when she fell. ?Head CT scan reviewed showed no acute intracranial bleeding but head mild soft tissue swelling of the right frontal scalp.  Has CT of the cervical spine showed no acute fracture but had chronic degenerative changes.  Checks x-ray was also negative for any acute abnormalities. ?She continue to complain of some light chest soreness and right forehead bruise and tenderness.  But states pain has improved.She denies any fever,chills,cough,fatigue,body aches,runny nose,chest tightness,chest pain,palpitation or shortness of breath. ?Medication reconciled ? ?Past Medical History:  ?Diagnosis Date  ? Abdominal pain 12/2009  ? hospitalized  ? Allergic rhinitis   ? Anxiety   ? Bladder problem   ? Chronic constipation   ? Compression fx, lumbar spine ([place])   ? DDD (degenerative disc disease), lumbar   ? Diverticulosis of colon   ? Dog bite(E906.0) 08/30/2011  ? Dysrhythmia   ? GERD (gastroesophageal reflux disease)   ? Hepatic steatosis   ? pt denies  ? Hyperlipidemia   ? Hyperplastic colon polyp   ? IBS (irritable bowel syndrome)   ? constipation predominant  ? Internal hemorrhoids   ? Osteoporosis   ? dexa 2011 -2.7 hip nl spine  ? PAF (paroxysmal atrial fibrillation) ([place])   ? a. dx 05/2017.  ? PMR (polymyalgia rheumatica) ([place]) 08/30/2011  ? under rheum care  on low dose pred 5 mg   ? Renal disorder   ? ?Past  Surgical History:  ?Procedure Laterality Date  ? ABDOMINAL HYSTERECTOMY    ? with bladder tact and rectocele repair  ? BLADDER SURGERY    ? Streched  ? CATARACT EXTRACTION  2010  ? both  ? COLONOSCOPY  2016 was most recent  ? CYSTO WITH HYDRODISTENSION N/A 10/05/2019  ? Procedure: CYSTOSCOPY/HYDRODISTENSION;  Surgeon: Bjorn Loser, MD;  Location: [facility];  Service: Urology;  Laterality: N/A;  ? TONSILLECTOMY AND  ADENOIDECTOMY    ? ? ?Allergies  ?Allergen Reactions  ? Alendronate Sodium   ?  upset stomach  ? Penicillins   ?  Has patient had a PCN reaction causing immediate rash, facial/tongue/throat swelling, SOB or lightheadedness with hypotension: Yes ?Has patient had a PCN reaction causing severe rash involving mucus membranes or skin necrosis: No ?Has patient had a PCN reaction that required hospitalization: No ?Has patient had a PCN reaction occurring within the last 10 years: No ?If all of the above answers are "NO", then may proceed with Cephalosporin use. ?  ? Amoxicillin Rash  ?  Has patient had a PCN reaction causing immediate rash, facial/tongue/throat swelling, SOB or lightheadedness with hypotension: yes ?Has patient had a PCN reaction causing severe rash involving mucus membranes or skin necrosis: no ?Has patient had a PCN reaction that required hospitalization: no ?Has patient had PCN reaction within the last 10 years: no  ?If all of the above answers are "NO", then may proceed with Cephalosporin use. ? ?REACTION: unspecified  ? Metronidazole Other (See Comments)  ?  Pounding headaches patient says was bad.  With cipro for diverticulitis rx. ?Other reaction(s): Other (See Comments) ?Pounding headaches patient says was bad.  With cipro for diverticulitis rx.  ? ? ?Outpatient Encounter Medications as of 08/08/2021  ?Medication Sig  ? acetaminophen (TYLENOL) 500 MG tablet Take 500 mg by mouth at bedtime as needed.  ? apixaban (ELIQUIS) 5 MG TABS tablet TAKE 1 TABLET BY MOUTH TWICE A DAY  ? Ascorbic Acid (VITAMIN C) 100 MG tablet Take 500 mg by mouth daily.  ? calcium carbonate (OS-CAL) 1250 (500 Ca) MG chewable tablet Chew 1 tablet by mouth daily.   ? diltiazem (CARDIZEM CD) 120 MG 24 hr capsule TAKE 1 CAPSULE BY MOUTH EVERY DAY  ? escitalopram (LEXAPRO) 5 MG tablet TAKE 1 TABLET BY MOUTH EVERYDAY AT BEDTIME  ? fluticasone (FLONASE) 50 MCG/ACT nasal spray Place 2 sprays into both nostrils daily.  ? guaiFENesin  (MUCINEX) 600 MG 12 hr tablet Take 600 mg by mouth daily as needed.  ? hydrocortisone cream (PREPARATION H) 1 % Apply 1 application. topically 2 (two) times daily.  ? loratadine (CLARITIN) 10 MG tablet TAKE 1 TABLET BY MOUTH EVERY DAY  ? Multiple Vitamin (MULTIVITAMIN) tablet Take 1 tablet by mouth daily.  ? polyethylene glycol powder (GLYCOLAX/MIRALAX) 17 GM/SCOOP powder Take 17 g by mouth daily. Hold for loose stool  ? PREMARIN vaginal cream INSERT 1 GRAM VAGINALLY THREE TIMES A WEEKLY, THEN ONCE WEEKLY  ? Probiotic Product (ALIGN) 4 MG CAPS Take 1 capsule by mouth as needed.  ? RESTASIS 0.05 % ophthalmic emulsion Place 1 drop into both eyes 2 (two) times daily.  ? ?No facility-administered encounter medications on file as of 08/08/2021.  ? ? ?Review of Systems  ?Constitutional:  Negative for appetite change, chills, fatigue, fever and unexpected weight change.  ?HENT:  Negative for congestion, dental problem, ear discharge, ear pain, facial swelling, hearing loss, nosebleeds, postnasal drip, rhinorrhea, sinus pressure, sinus pain, sneezing, sore  throat, tinnitus and trouble swallowing.   ?Eyes:  Negative for pain, discharge, redness, itching and visual disturbance.  ?Respiratory:  Negative for cough, shortness of breath and wheezing.   ?     Right rib cage soreness has improved.  ?Cardiovascular:  Negative for chest pain, palpitations and leg swelling.  ?Gastrointestinal:  Negative for abdominal distention, abdominal pain, blood in stool, constipation, diarrhea, nausea and vomiting.  ?Endocrine: Negative for cold intolerance, heat intolerance, polydipsia, polyphagia and polyuria.  ?Genitourinary:  Negative for difficulty urinating, dysuria, flank pain, frequency and urgency.  ?Musculoskeletal:  Negative for arthralgias, back pain, gait problem, joint swelling, myalgias, neck pain and neck stiffness.  ?Skin:  Negative for color change, pallor, rash and wound.  ?Neurological:  Negative for dizziness, syncope, speech  difficulty, weakness, light-headedness, numbness and headaches.  ?Hematological:  Does not bruise/bleed easily.  ?Psychiatric/Behavioral:  Negative for agitation, behavioral problems, confusion, hallucinatio

## 2021-08-08 NOTE — Patient Instructions (Signed)
-   Apply ice compressor to right forehead for 10-15 minutes daily  ? ?- continue on extra strength Tylenol for pain as needed  ?

## 2021-08-10 LAB — URINE CULTURE
MICRO NUMBER:: 13383807
Result:: NO GROWTH
SPECIMEN QUALITY:: ADEQUATE

## 2021-08-15 ENCOUNTER — Other Ambulatory Visit: Payer: Self-pay | Admitting: Cardiovascular Disease

## 2021-08-21 NOTE — Progress Notes (Signed)
Cardiology Office Note    Date:  08/29/2021   ID:  Ashley Lawson, DOB September 03, 1932, MRN [id number]   PCP:  Sandrea Hughs, NP   [place]  Cardiologist:  Lauree Chandler, MD   Advanced Practice Provider:  No care team member to display Electrophysiologist:  None   [id number]}   Chief Complaint  Patient presents with   Follow-up    History of Present Illness:  Ashley Lawson is a 88 y.o. female with history of GERD, diverticulosis, IBS, HLD, PMR and paroxysmal atrial fibrillation  She was admitted to [facility] in January 2019 with a UTI and sinus infection and was found to be in atrial fibrillation. She was started on a beta blocker and Eliquis and converted to sinus. She was treated for hyponatremia. Echo 05/02/17 with LVEF=55-60%, mild MR. TSH was normal   She was admitted to [facility] March 2021 with syncope in the setting of severe abdominal pain during a bowel movement. Telemetry showed transient rate controlled atrial fibrillation. Echo 06/18/19 with LVEF=65-70%. No valve disease. Her syncope was felt to be a vasovagal event. Cardiac monitor with atrial fib but no pauses.   Patient was last seen in the office 05/18/2020 by Dr. Angelena Form.  She was in A-fib on exam no palpitation diltiazem and Eliquis.  Patient comes in with her daughter. She fell and hit her head trying to shoot a basketball. Denies chest pain, dyspnea, palpitations, edema. No exercise since she fell. She has a treadmill and hasn't used it recently. She still drives but lives with her daughter. Hasn't driven lately. She is on Eliquis and it is going to cost over $600 for 90 days. She is napping more.   Past Medical History:  Diagnosis Date   Abdominal pain 12/2009   hospitalized   Allergic rhinitis    Anxiety    Bladder problem    Chronic constipation    Compression fx, lumbar spine (HCC)    DDD (degenerative disc disease), lumbar    Diverticulosis of colon    Dog bite(E906.0) 08/30/2011    Dysrhythmia    GERD (gastroesophageal reflux disease)    Hepatic steatosis    pt denies   Hyperlipidemia    Hyperplastic colon polyp    IBS (irritable bowel syndrome)    constipation predominant   Internal hemorrhoids    Osteoporosis    dexa 2011 -2.7 hip nl spine   PAF (paroxysmal atrial fibrillation) ([place])    a. dx 05/2017.   PMR (polymyalgia rheumatica) ([place]) 08/30/2011   under rheum care  on low dose pred 5 mg    Renal disorder     Past Surgical History:  Procedure Laterality Date   ABDOMINAL HYSTERECTOMY     with bladder tact and rectocele repair   BLADDER SURGERY     Streched   CATARACT EXTRACTION  2010   both   COLONOSCOPY  2016 was most recent   CYSTO WITH HYDRODISTENSION N/A 10/05/2019   Procedure: CYSTOSCOPY/HYDRODISTENSION;  Surgeon: Bjorn Loser, MD;  Location: [facility];  Service: Urology;  Laterality: N/A;   TONSILLECTOMY AND ADENOIDECTOMY      Current Medications: Current Meds  Medication Sig   acetaminophen (TYLENOL) 500 MG tablet Take 500 mg by mouth at bedtime as needed.   apixaban (ELIQUIS) 5 MG TABS tablet TAKE 1 TABLET BY MOUTH TWICE A DAY   Ascorbic Acid (VITAMIN C) 100 MG tablet Take 500 mg by mouth daily.   calcium carbonate (OS-CAL) 1250 (  500 Ca) MG chewable tablet Chew 1 tablet by mouth daily.    diltiazem (CARDIZEM CD) 120 MG 24 hr capsule TAKE 1 CAPSULE BY MOUTH EVERY DAY   escitalopram (LEXAPRO) 5 MG tablet TAKE 1 TABLET BY MOUTH EVERYDAY AT BEDTIME   fluticasone (FLONASE) 50 MCG/ACT nasal spray Place 2 sprays into both nostrils daily.   guaiFENesin (MUCINEX) 600 MG 12 hr tablet Take 600 mg by mouth daily as needed.   loratadine (CLARITIN) 10 MG tablet TAKE 1 TABLET BY MOUTH EVERY DAY   Multiple Vitamin (MULTIVITAMIN) tablet Take 1 tablet by mouth daily.   polyethylene glycol powder (GLYCOLAX/MIRALAX) 17 GM/SCOOP powder Take 17 g by mouth daily. Hold for loose stool   PREMARIN vaginal cream INSERT 1 GRAM VAGINALLY THREE TIMES A WEEKLY, THEN  ONCE WEEKLY   Probiotic Product (ALIGN) 4 MG CAPS Take 1 capsule by mouth as needed.   RESTASIS 0.05 % ophthalmic emulsion Place 1 drop into both eyes 2 (two) times daily.     Allergies:   Alendronate sodium, Penicillins, Amoxicillin, and Metronidazole   Social History   Socioeconomic History   Marital status: Widowed    Spouse name: Not on file   Number of children: Not on file   Years of education: Not on file   Highest education level: Not on file  Occupational History   Occupation: retired  Tobacco Use   Smoking status: Never   Smokeless tobacco: Never  Vaping Use   Vaping Use: Never used  Substance and Sexual Activity   Alcohol use: Yes    Alcohol/week: 0.0 standard drinks    Comment: occ   Drug use: Never   Sexual activity: Not Currently  Other Topics Concern   Not on file  Social History Narrative   Retired   Regular exercise- yes   Widowed   At home with children and GKs    HH of 2     Puppy   Plays cards is social and active       Diet:  No      Do you drink/ eat things with caffeine? Yes      Marital status:   Widow                            What year were you married ? 1610,9604      Do you live in a house, apartment,assistred living, condo, trailer, etc.)? Townhouse      Is it one or more stories? 2      How many persons live in your home ? 2      Do you have any pets in your home ?(please list)  None      Highest Level of education completed:  12 th Grade      Current or past profession: Retail Sales @ [employer]      Do you exercise?    Some                          Type & how often  Walking      ADVANCED DIRECTIVES (Please bring copies)      Do you have a living will? Yes      Do you have a DNR form?  Yes                     If not, do you want to discuss one?  Do you have signed POA?HPOA forms? Yes                If so, please bring to your appointment      FUNCTIONAL STATUS- To be completed by Spouse / child / Staff       Do you  have difficulty bathing or dressing yourself ? No      Do you have difficulty preparing food or eating ?  No      Do you have difficulty managing your mediation ? No      Do you have difficulty managing your finances ? No      Do you have difficulty affording your medication ? No      Social Determinants of Radio broadcast assistant Strain: Not on file  Food Insecurity: Not on file  Transportation Needs: Not on file  Physical Activity: Not on file  Stress: Not on file  Social Connections: Not on file     Family History:  The patient's  family history includes Alzheimer's disease (age of onset: 35) in her sister; Bladder Cancer (age of onset: 71) in her sister; Crohn's disease in her son; Heart attack (age of onset: 1) in her father; Liver disease (age of onset: 62) in her brother; Stroke (age of onset: 31) in her mother.   ROS:   Please see the history of present illness.    ROS All other systems reviewed and are negative.   PHYSICAL EXAM:   VS:  BP (!) 148/80   Pulse 81   Ht 5' 2"  (1.575 m)   Wt 161 lb 3.2 oz (73.1 kg)   SpO2 96%   BMI 29.48 kg/m   Physical Exam  GEN: Well nourished, well developed, in no acute distress  Neck: no JVD, carotid bruits, or masses Cardiac:irreg irreg; no murmurs, rubs, or gallops  Respiratory:  clear to auscultation bilaterally, normal work of breathing GI: soft, nontender, nondistended, + BS Ext: without cyanosis, clubbing, or edema, Good distal pulses bilaterally Neuro:  Alert and Oriented x 3,  Psych: euthymic mood, full affect  Wt Readings from Last 3 Encounters:  08/29/21 161 lb 3.2 oz (73.1 kg)  08/03/21 164 lb (74.4 kg)  05/16/21 165 lb (74.8 kg)      Studies/Labs Reviewed:   EKG:  EKG is  ordered today.  The ekg ordered today demonstrates Afib 81/m low voltage  Recent Labs: 05/07/2021: ALT 15; BUN 6; Creat 0.80; Potassium 3.8; Sodium 136; TSH 5.50 08/08/2021: Hemoglobin 12.9; Platelets 281   Lipid Panel    Component  Value Date/Time   CHOL 200 (H) 05/07/2021 0921   TRIG 111 05/07/2021 0921   HDL 66 05/07/2021 0921   CHOLHDL 3.0 05/07/2021 0921   VLDL 40.6 (H) 04/07/2019 1552   LDLCALC 112 (H) 05/07/2021 0921   LDLDIRECT 100.0 04/07/2019 1552    Additional studies/ records that were reviewed today include:  Echo 06/18/19: 1. Left ventricular ejection fraction, by estimation, is 65 to 70%. The  left ventricle has normal function. The left ventricle has no regional  wall motion abnormalities. Left ventricular diastolic parameters are  indeterminate.   2. Right ventricular systolic function is normal. The right ventricular  size is normal. There is normal pulmonary artery systolic pressure.   3. Left atrial size was moderately dilated.   4. Right atrial size was severely dilated.   5. The mitral valve is normal in structure. Trivial mitral valve  regurgitation. No evidence of mitral stenosis.  6. Tricuspid valve regurgitation is mild to moderate.   7. The aortic valve is normal in structure. Aortic valve regurgitation is  not visualized. No aortic stenosis is present.   8. The inferior vena cava is normal in size with greater than 50%  respiratory variability, suggesting right atrial pressure of 3 mmHg.      Risk Assessment/Calculations:    CHA2DS2-VASc Score = 3   This indicates a 3.2% annual risk of stroke. The patient's score is based upon: CHF History: 0 HTN History: 0 Diabetes History: 0 Stroke History: 0 Vascular Disease History: 0 Age Score: 2 Gender Score: 1        ASSESSMENT:    1. Paroxysmal atrial fibrillation (HCC)   2. Mild mitral regurgitation   3. Hyperlipidemia, unspecified hyperlipidemia type      PLAN:  In order of problems listed above:  Paroxysmal atrial fibrillation on Eliquis and diltiazem-rate controlled. Eliquis going to cost her over $600 since insurance changed. Have given patient assistance forms and will reach out to [place] [facility] to help them navigate  this.  Mitral regurgitation trivial on echo 2021  Hyperlipidemia-LDL 112 05/2021  Shared Decision Making/Informed Consent        Medication Adjustments/Labs and Tests Ordered: Current medicines are reviewed at length with the patient today.  Concerns regarding medicines are outlined above.  Medication changes, Labs and Tests ordered today are listed in the Patient Instructions below. Patient Instructions  Medication Instructions:  Your physician recommends that you continue on your current medications as directed. Please refer to the Current Medication list given to you today.  *If you need a refill on your cardiac medications before your next appointment, please call your pharmacy*   Lab Work: None If you have labs (blood work) drawn today and your tests are completely normal, you will receive your results only by: [place] (if you have MyChart) OR A paper copy in the mail If you have any lab test that is abnormal or we need to change your treatment, we will call you to review the results.   Follow-Up: At [facility], you and your health needs are our priority.  As part of our continuing mission to provide you with exceptional heart care, we have created designated Provider Care Teams.  These Care Teams include your primary Cardiologist (physician) and Advanced Practice Providers (APPs -  Physician Assistants and Nurse Practitioners) who all work together to provide you with the care you need, when you need it.  We recommend signing up for the patient portal called "MyChart".  Sign up information is provided on this After Visit Summary.  MyChart is used to connect with patients for Virtual Visits (Telemedicine).  Patients are able to view lab/test results, encounter notes, upcoming appointments, etc.  Non-urgent messages can be sent to your provider as well.   To learn more about what you can do with MyChart, go to [email or web address].    Your next appointment:   1  year(s)  The format for your next appointment:   In Person  Provider:   Lauree Chandler, MD {     Signed, Ermalinda Barrios, PA-C  08/29/2021 2:24 PM    [address], [place], [place]  [address] Phone: [phone]; Fax: [phone]

## 2021-08-29 ENCOUNTER — Encounter: Payer: Self-pay | Admitting: Physician Assistant

## 2021-08-29 ENCOUNTER — Ambulatory Visit: Payer: Medicare Other | Admitting: Physician Assistant

## 2021-08-29 VITALS — BP 148/80 | HR 81 | Ht 62.0 in | Wt 161.2 lb

## 2021-08-29 DIAGNOSIS — I340 Nonrheumatic mitral (valve) insufficiency: Secondary | ICD-10-CM | POA: Diagnosis not present

## 2021-08-29 DIAGNOSIS — E785 Hyperlipidemia, unspecified: Secondary | ICD-10-CM

## 2021-08-29 DIAGNOSIS — I480 Paroxysmal atrial fibrillation: Secondary | ICD-10-CM

## 2021-08-29 NOTE — Patient Instructions (Signed)
Medication Instructions:  Your physician recommends that you continue on your current medications as directed. Please refer to the Current Medication list given to you today.  *If you need a refill on your cardiac medications before your next appointment, please call your pharmacy*   Lab Work: None If you have labs (blood work) drawn today and your tests are completely normal, you will receive your results only by: [place] (if you have MyChart) OR A paper copy in the mail If you have any lab test that is abnormal or we need to change your treatment, we will call you to review the results.   Follow-Up: At [facility], you and your health needs are our priority.  As part of our continuing mission to provide you with exceptional heart care, we have created designated Provider Care Teams.  These Care Teams include your primary Cardiologist (physician) and Advanced Practice Providers (APPs -  Physician Assistants and Nurse Practitioners) who all work together to provide you with the care you need, when you need it.  We recommend signing up for the patient portal called "MyChart".  Sign up information is provided on this After Visit Summary.  MyChart is used to connect with patients for Virtual Visits (Telemedicine).  Patients are able to view lab/test results, encounter notes, upcoming appointments, etc.  Non-urgent messages can be sent to your provider as well.   To learn more about what you can do with MyChart, go to [email or web address].    Your next appointment:   1 year(s)  The format for your next appointment:   In Person  Provider:   Lauree Chandler, MD {

## 2021-09-03 ENCOUNTER — Ambulatory Visit: Payer: Medicare Other | Admitting: Family

## 2021-09-03 ENCOUNTER — Encounter: Payer: Self-pay | Admitting: Family

## 2021-09-03 VITALS — BP 116/60 | HR 77 | Temp 96.7°F | Resp 16 | Ht 62.0 in | Wt 162.8 lb

## 2021-09-03 DIAGNOSIS — K625 Hemorrhage of anus and rectum: Secondary | ICD-10-CM

## 2021-09-03 DIAGNOSIS — I480 Paroxysmal atrial fibrillation: Secondary | ICD-10-CM

## 2021-09-03 NOTE — Progress Notes (Signed)
Provider: Marlowe Sax FNP-C  Zavier Canela, [name], NP  Patient Care Team: Molly Maselli, [name], NP as PCP - General (Family Medicine) Burnell Blanks, MD as PCP - Cardiology (Cardiology) Bjorn Loser, MD (Urology) Particia Nearing, MD (Dermatology) Luberta Mutter, MD (Ophthalmology) Almedia Balls, MD (Orthopedic Surgery) Ouida Sills  (Rheumatology) Jarome Matin, MD as Consulting Physician (Dermatology) Gatha Mayer, MD as Consulting Physician (Gastroenterology) Rozetta Nunnery, MD (Inactive) as Consulting Physician (Otolaryngology)  Extended Emergency Contact Information Primary Emergency Contact: Apple,Karen Address: [address]          [place] [address] [name] of [place] Phone: [phone] Work Phone: [phone] Mobile Phone: [phone] Relation: Daughter  Code Status:  Full Code  Goals of care: Advanced Directive information    09/03/2021    1:15 PM  Advanced Directives  Does Patient Have a Medical Advance Directive? Yes  Type of Paramedic of [place];Living will  Does patient want to make changes to medical advance directive? No - Patient declined  Copy of [place] in Chart? Yes - validated most recent copy scanned in chart (See row information)     Chief Complaint  Patient presents with   Acute Visit    Patient complains of rectal bleeding that started last Friday night. 08/29/2021.    HPI:  Pt is a 88 y.o. female seen today for an acute visit for evaluation of rectal bleeding which started on 08/29/2021.Daughter states held Eliquis for the past three days.Has had no bleeding today.Has had constipation and tends to strain when bowels move. Has not been taking Mirlax and senokot as directed.  She denies any abdominal pain,bloating,nausea or vomiting.    Past Medical History:  Diagnosis Date   Abdominal pain 12/2009   hospitalized   Allergic rhinitis    Anxiety    Bladder  problem    Chronic constipation    Compression fx, lumbar spine (HCC)    DDD (degenerative disc disease), lumbar    Diverticulosis of colon    Dog bite(E906.0) 08/30/2011   Dysrhythmia    GERD (gastroesophageal reflux disease)    Hepatic steatosis    pt denies   Hyperlipidemia    Hyperplastic colon polyp    IBS (irritable bowel syndrome)    constipation predominant   Internal hemorrhoids    Osteoporosis    dexa 2011 -2.7 hip nl spine   PAF (paroxysmal atrial fibrillation) ([place])    a. dx 05/2017.   PMR (polymyalgia rheumatica) ([place]) 08/30/2011   under rheum care  on low dose pred 5 mg    Renal disorder    Past Surgical History:  Procedure Laterality Date   ABDOMINAL HYSTERECTOMY     with bladder tact and rectocele repair   BLADDER SURGERY     Streched   CATARACT EXTRACTION  2010   both   COLONOSCOPY  2016 was most recent   CYSTO WITH HYDRODISTENSION N/A 10/05/2019   Procedure: CYSTOSCOPY/HYDRODISTENSION;  Surgeon: Bjorn Loser, MD;  Location: [facility];  Service: Urology;  Laterality: N/A;   TONSILLECTOMY AND ADENOIDECTOMY      Allergies  Allergen Reactions   Alendronate Sodium     upset stomach   Penicillins     Has patient had a PCN reaction causing immediate rash, facial/tongue/throat swelling, SOB or lightheadedness with hypotension: Yes Has patient had a PCN reaction causing severe rash involving mucus membranes or skin necrosis: No Has patient had a PCN reaction that required hospitalization: No Has patient had  a PCN reaction occurring within the last 10 years: No If all of the above answers are "NO", then may proceed with Cephalosporin use.    Amoxicillin Rash    Has patient had a PCN reaction causing immediate rash, facial/tongue/throat swelling, SOB or lightheadedness with hypotension: yes Has patient had a PCN reaction causing severe rash involving mucus membranes or skin necrosis: no Has patient had a PCN reaction that required hospitalization: no Has  patient had PCN reaction within the last 10 years: no  If all of the above answers are "NO", then may proceed with Cephalosporin use.  REACTION: unspecified   Metronidazole Other (See Comments)    Pounding headaches patient says was bad.  With cipro for diverticulitis rx. Other reaction(s): Other (See Comments) Pounding headaches patient says was bad.  With cipro for diverticulitis rx.    Outpatient Encounter Medications as of 09/03/2021  Medication Sig   acetaminophen (TYLENOL) 500 MG tablet Take 500 mg by mouth at bedtime as needed.   Ascorbic Acid (VITAMIN C) 100 MG tablet Take 500 mg by mouth daily.   calcium carbonate (OS-CAL) 1250 (500 Ca) MG chewable tablet Chew 1 tablet by mouth daily.    diltiazem (CARDIZEM CD) 120 MG 24 hr capsule TAKE 1 CAPSULE BY MOUTH EVERY DAY   escitalopram (LEXAPRO) 5 MG tablet TAKE 1 TABLET BY MOUTH EVERYDAY AT BEDTIME   fluticasone (FLONASE) 50 MCG/ACT nasal spray Place 2 sprays into both nostrils daily.   guaiFENesin (MUCINEX) 600 MG 12 hr tablet Take 600 mg by mouth daily as needed.   loratadine (CLARITIN) 10 MG tablet TAKE 1 TABLET BY MOUTH EVERY DAY   Multiple Vitamin (MULTIVITAMIN) tablet Take 1 tablet by mouth daily.   polyethylene glycol powder (GLYCOLAX/MIRALAX) 17 GM/SCOOP powder Take 17 g by mouth daily. Hold for loose stool   PREMARIN vaginal cream INSERT 1 GRAM VAGINALLY THREE TIMES A WEEKLY, THEN ONCE WEEKLY   Probiotic Product (ALIGN) 4 MG CAPS Take 1 capsule by mouth as needed.   RESTASIS 0.05 % ophthalmic emulsion Place 1 drop into both eyes 2 (two) times daily.   apixaban (ELIQUIS) 5 MG TABS tablet TAKE 1 TABLET BY MOUTH TWICE A DAY (Patient not taking: Reported on 09/03/2021)   No facility-administered encounter medications on file as of 09/03/2021.    Review of Systems  Constitutional:  Negative for appetite change, chills, fatigue and fever.  Gastrointestinal:  Positive for constipation. Negative for abdominal distention, abdominal  pain, blood in stool, diarrhea, nausea and vomiting.       Rectal bleeding 08/29/2021   Genitourinary:  Negative for difficulty urinating, dysuria, flank pain, hematuria, urgency and vaginal bleeding.  Neurological:  Negative for dizziness, light-headedness and headaches.   Immunization History  Administered Date(s) Administered   Fluad Quad(high Dose 65+) 01/10/2020   Influenza Split 01/25/2011, 01/23/2012   Influenza Whole 12/31/2007   Influenza, High Dose Seasonal PF 01/19/2013, 01/10/2014, 01/17/2015, 01/18/2016, 01/02/2017, 01/05/2018, 01/05/2019, 01/05/2019, 02/07/2021   Influenza-Unspecified 01/02/2017   PFIZER(Purple Top)SARS-COV-2 Vaccination 05/07/2019, 06/02/2019, 01/31/2020, 08/02/2020   Pfizer Covid-19 Vaccine Bivalent Booster 108yr & up 02/19/2021   Pneumococcal Conjugate-13 02/23/2013   Pneumococcal Polysaccharide-23 04/01/2006, 04/01/2018   Td 04/01/2005   Td (Adult) 11/22/2016   Tdap 08/30/2011, 08/03/2021   Zoster Recombinat (Shingrix) 12/04/2016, 07/27/2017   Zoster, Live 04/02/2007   Pertinent  Health Maintenance Due  Topic Date Due   INFLUENZA VACCINE  10/30/2021   MAMMOGRAM  09/09/2022   DEXA SCAN  Completed  03/29/2021    2:33 PM 05/10/2021    4:52 PM 08/03/2021    2:27 PM 08/08/2021    3:12 PM 09/03/2021    1:14 PM  Fall Risk  Falls in the past year? 0 0  1 1  Was there an injury with Fall? 0 0  1 1  Was there an injury with Fall? - Comments    Hit head   Fall Risk Category Calculator 0 0  2 2  Fall Risk Category Low Low  Moderate Moderate  Patient Fall Risk Level Low fall risk Low fall risk Low fall risk Moderate fall risk Moderate fall risk  Patient at Risk for Falls Due to No Fall Risks No Fall Risks  History of fall(s) History of fall(s)  Fall risk Follow up Falls evaluation completed Falls evaluation completed  Falls evaluation completed Falls evaluation completed;Education provided;Falls prevention discussed   Functional Status Survey:     Vitals:   09/03/21 1308  BP: 116/60  Pulse: 77  Resp: 16  Temp: (!) 96.7 F (35.9 C)  SpO2: 96%  Weight: 162 lb 12.8 oz (73.8 kg)  Height: 5' 2"  (1.575 m)   Body mass index is 29.78 kg/m. Physical Exam Vitals reviewed. Exam conducted with a chaperone present [facility],CMA).  Constitutional:      General: She is not in acute distress.    Appearance: Normal appearance. She is normal weight. She is not ill-appearing or diaphoretic.  HENT:     Mouth/Throat:     Mouth: Mucous membranes are moist.     Pharynx: Oropharynx is clear. No oropharyngeal exudate or posterior oropharyngeal erythema.  Eyes:     General: No scleral icterus.       Right eye: No discharge.        Left eye: No discharge.     Conjunctiva/sclera: Conjunctivae normal.     Pupils: Pupils are equal, round, and reactive to light.  Cardiovascular:     Rate and Rhythm: Normal rate and regular rhythm.     Pulses: Normal pulses.     Heart sounds: Normal heart sounds. No murmur heard.    No friction rub. No gallop.  Pulmonary:     Effort: Pulmonary effort is normal. No respiratory distress.     Breath sounds: Normal breath sounds. No wheezing, rhonchi or rales.  Chest:     Chest wall: No tenderness.  Abdominal:     General: Bowel sounds are normal. There is no distension.     Palpations: Abdomen is soft. There is no mass.     Tenderness: There is no abdominal tenderness. There is no right CVA tenderness, left CVA tenderness, guarding or rebound.  Genitourinary:    Exam position: Knee-chest position.     Rectum: Guaiac result negative. No mass, tenderness, anal fissure, external hemorrhoid or internal hemorrhoid. Normal anal tone.  Musculoskeletal:        General: No swelling or tenderness. Normal range of motion.     Right lower leg: No edema.     Left lower leg: No edema.  Skin:    General: Skin is warm and dry.     Coloration: Skin is not pale.     Findings: No bruising or erythema.  Neurological:      Mental Status: She is alert and oriented to person, place, and time.     Motor: No weakness.     Gait: Gait normal.  Psychiatric:        Mood and Affect: Mood  normal.        Speech: Speech normal.        Behavior: Behavior normal.     Labs reviewed: Recent Labs    11/03/20 0931 05/07/21 0921  NA 135 136  K 4.3 3.8  CL 100 101  CO2 26 25  GLUCOSE 82 86  BUN 9 6*  CREATININE 0.79 0.80  CALCIUM 9.0 9.4   Recent Labs    11/03/20 0931 05/07/21 0921  AST 16 20  ALT 13 15  BILITOT 0.7 1.1  PROT 6.7 6.9   Recent Labs    11/03/20 0931 05/07/21 0921 08/08/21 1612  WBC 5.0 5.3 9.5  NEUTROABS 3,005 3,169 6,346  HGB 13.3 13.7 12.9  HCT 39.7 41.2 39.6  MCV 94.7 94.9 94.3  PLT 245 248 281   Lab Results  Component Value Date   TSH 5.50 (H) 05/07/2021   No results found for: HGBA1C Lab Results  Component Value Date   CHOL 200 (H) 05/07/2021   HDL 66 05/07/2021   LDLCALC 112 (H) 05/07/2021   LDLDIRECT 100.0 04/07/2019   TRIG 111 05/07/2021   CHOLHDL 3.0 05/07/2021    Significant Diagnostic Results in last 30 days:  No results found.  Assessment/Plan  1. Rectal bleeding started on 08/29/2021.Daughter held Eliquis for the past three days.Has had no bleeding today.Has had constipation and tends to strain when bowels move.Has not been taking her miralax or senokot as directed. - advised to Take miralax 17 Gm mix in 8 oz of water and drink daily for constipation  2. AF (paroxysmal atrial fibrillation) (HCC) Eliquis held for the past 3 days due to rectal bleeding.Guaiac negative on exam today - may restart Eliquis then hold if bleeding recurs and notify provider.  - if bleeding persist will need to discuss with cardiology risk verse benefits of Eliquis.   Family/ staff Communication: Reviewed plan of care with patient and daughter verbalized understanding   Labs/tests ordered: None   Next Appointment: As needed if symptoms worsen or fail to improve     Ashley Hughs, NP

## 2021-09-03 NOTE — Patient Instructions (Signed)
-   may restart Eliquis then hold if bleeding recurs and notify provider.  - Take miralax 17 Gm mix in 8 oz of water and drink daily for constipation

## 2021-09-10 ENCOUNTER — Other Ambulatory Visit: Payer: Self-pay | Admitting: Cardiovascular Disease

## 2021-11-02 ENCOUNTER — Encounter: Payer: Self-pay | Admitting: Adult Health

## 2021-11-02 ENCOUNTER — Ambulatory Visit (INDEPENDENT_AMBULATORY_CARE_PROVIDER_SITE_OTHER): Payer: Medicare Other | Admitting: Adult Health

## 2021-11-02 VITALS — BP 128/80 | HR 61 | Temp 97.9°F | Resp 20 | Ht 62.0 in | Wt 160.0 lb

## 2021-11-02 DIAGNOSIS — K5901 Slow transit constipation: Secondary | ICD-10-CM

## 2021-11-02 DIAGNOSIS — J019 Acute sinusitis, unspecified: Secondary | ICD-10-CM | POA: Diagnosis not present

## 2021-11-02 DIAGNOSIS — I480 Paroxysmal atrial fibrillation: Secondary | ICD-10-CM | POA: Diagnosis not present

## 2021-11-02 MED ORDER — AZITHROMYCIN 250 MG PO TABS: ORAL_TABLET | ORAL | 0 refills | Status: AC

## 2021-11-02 MED ORDER — APIXABAN 5 MG PO TABS: 5.0000 mg | ORAL_TABLET | Freq: Two times a day (BID) | ORAL | 3 refills | Status: DC

## 2021-11-02 NOTE — Patient Instructions (Signed)

## 2021-11-02 NOTE — Progress Notes (Signed)
[facility] clinic  Provider:   Durenda Age DNP  Code Status:  Full Code  Goals of Care:     09/03/2021    1:15 PM  Advanced Directives  Does Patient Have a Medical Advance Directive? Yes  Type of Paramedic of [place];Living will  Does patient want to make changes to medical advance directive? No - Patient declined  Copy of [place] in Chart? Yes - validated most recent copy scanned in chart (See row information)     Chief Complaint  Patient presents with   Acute Visit    Patient complains of sinus drainage.    HPI: Patient is a 88 y.o. female seen today for an acute visit for sinus drainage. She has a PMH of GERD, diverticulosis, IBS, HLD, PMR and PAF. Daughter was with her during the visit today. She complains of having throat drainage and right ear pain X 1 week. She takes Loratadine 10 mg daily and Flonase 50 mcg/ACT 2 sprays to each nostril daily for allergic rhinitis. She has dry cough with occasional yellowish phlegm. She, also, complains of tenderness on her bilateral maxilla and bilateral neck. She denies fever, SOB nor chills. Right ear TM bulging with no erythema.   D[facility] stated [facility]. She takes Eliquis 5 mg BID for atrial fibrillation.She denies having chest pains.  Past Medical History:  Diagnosis Date   Abdominal pain 12/2009   hospitalized   Allergic rhinitis    Anxiety    Bladder problem    Chronic constipation    Compression fx, lumbar spine (HCC)    DDD (degenerative disc disease), lumbar    Diverticulosis of colon    Dog bite(E906.0) 08/30/2011   Dysrhythmia    GERD (gastroesophageal reflux disease)    Hepatic steatosis    pt denies   Hyperlipidemia    Hyperplastic colon polyp    IBS (irritable bowel syndrome)    constipation predominant   Internal hemorrhoids    Osteoporosis    dexa 2011 -2.7 hip nl spine   PAF (paroxysmal atrial fibrillation) ([place])     a. dx 05/2017.   PMR (polymyalgia rheumatica) ([place]) 08/30/2011   under rheum care  on low dose pred 5 mg    Renal disorder     Past Surgical History:  Procedure Laterality Date   ABDOMINAL HYSTERECTOMY     with bladder tact and rectocele repair   BLADDER SURGERY     Streched   CATARACT EXTRACTION  2010   both   COLONOSCOPY  2016 was most recent   CYSTO WITH HYDRODISTENSION N/A 10/05/2019   Procedure: CYSTOSCOPY/HYDRODISTENSION;  Surgeon: Bjorn Loser, MD;  Location: [facility];  Service: Urology;  Laterality: N/A;   TONSILLECTOMY AND ADENOIDECTOMY      Allergies  Allergen Reactions   Alendronate Sodium     upset stomach   Penicillins     Has patient had a PCN reaction causing immediate rash, facial/tongue/throat swelling, SOB or lightheadedness with hypotension: Yes Has patient had a PCN reaction causing severe rash involving mucus membranes or skin necrosis: No Has patient had a PCN reaction that required hospitalization: No Has patient had a PCN reaction occurring within the last 10 years: No If all of the above answers are "NO", then may proceed with Cephalosporin use.    Amoxicillin Rash    Has patient had a PCN reaction causing immediate rash, facial/tongue/throat swelling, SOB or lightheadedness with hypotension:  yes Has patient had a PCN reaction causing severe rash involving mucus membranes or skin necrosis: no Has patient had a PCN reaction that required hospitalization: no Has patient had PCN reaction within the last 10 years: no  If all of the above answers are "NO", then may proceed with Cephalosporin use.  REACTION: unspecified   Metronidazole Other (See Comments)    Pounding headaches patient says was bad.  With cipro for diverticulitis rx. Other reaction(s): Other (See Comments) Pounding headaches patient says was bad.  With cipro for diverticulitis rx.    Outpatient Encounter Medications as of 11/02/2021  Medication Sig   acetaminophen (TYLENOL) 500 MG  tablet Take 500 mg by mouth at bedtime as needed.   apixaban (ELIQUIS) 5 MG TABS tablet Take 1 tablet (5 mg total) by mouth 2 (two) times daily.   Ascorbic Acid (VITAMIN C) 100 MG tablet Take 500 mg by mouth daily.   azithromycin (ZITHROMAX) 250 MG tablet Take 2 tablets on day 1, then 1 tablet daily on days 2 through 5   calcium carbonate (OS-CAL) 1250 (500 Ca) MG chewable tablet Chew 1 tablet by mouth daily.    diltiazem (CARDIZEM CD) 120 MG 24 hr capsule TAKE 1 CAPSULE BY MOUTH EVERY DAY   escitalopram (LEXAPRO) 5 MG tablet TAKE 1 TABLET BY MOUTH EVERYDAY AT BEDTIME   fluticasone (FLONASE) 50 MCG/ACT nasal spray Place 2 sprays into both nostrils daily.   guaiFENesin (MUCINEX) 600 MG 12 hr tablet Take 600 mg by mouth daily as needed.   loratadine (CLARITIN) 10 MG tablet TAKE 1 TABLET BY MOUTH EVERY DAY   Multiple Vitamin (MULTIVITAMIN) tablet Take 1 tablet by mouth daily.   polyethylene glycol powder (GLYCOLAX/MIRALAX) 17 GM/SCOOP powder Take 17 g by mouth daily. Hold for loose stool   PREMARIN vaginal cream INSERT 1 GRAM VAGINALLY THREE TIMES A WEEKLY, THEN ONCE WEEKLY   Probiotic Product (ALIGN) 4 MG CAPS Take 1 capsule by mouth as needed.   RESTASIS 0.05 % ophthalmic emulsion Place 1 drop into both eyes 2 (two) times daily.   [DISCONTINUED] apixaban (ELIQUIS) 5 MG TABS tablet TAKE 1 TABLET BY MOUTH TWICE A DAY (Patient not taking: Reported on 09/03/2021)   No facility-administered encounter medications on file as of 11/02/2021.    Review of Systems:  Review of Systems  Constitutional:  Negative for appetite change, chills, fatigue and fever.  HENT:  Positive for ear pain, postnasal drip, sinus pressure and sinus pain. Negative for congestion, facial swelling, hearing loss, rhinorrhea and sore throat.   Eyes: Negative.   Respiratory:  Negative for cough, shortness of breath and wheezing.   Cardiovascular:  Negative for chest pain, palpitations and leg swelling.  Gastrointestinal:  Positive  for constipation. Negative for abdominal pain, diarrhea, nausea and vomiting.  Genitourinary:  Negative for dysuria.  Musculoskeletal:  Negative for arthralgias, back pain and myalgias.  Skin:  Negative for color change, rash and wound.  Neurological:  Negative for dizziness, weakness and headaches.  Psychiatric/Behavioral:  Negative for behavioral problems. The patient is not nervous/anxious.     Health Maintenance  Topic Date Due   INFLUENZA VACCINE  10/30/2021   MAMMOGRAM  09/09/2022   TETANUS/TDAP  08/04/2031   Pneumonia Vaccine 42+ Years old  Completed   DEXA SCAN  Completed   COVID-19 Vaccine  Completed   Zoster Vaccines- Shingrix  Completed   HPV VACCINES  Aged Out    Physical Exam: Vitals:   11/02/21 1523  BP: 128/80  Pulse: 61  Resp: 20  Temp: 97.9 F (36.6 C)  TempSrc: Temporal  SpO2: 96%  Weight: 160 lb (72.6 kg)  Height: 5' 2"  (1.575 m)   Body mass index is 29.26 kg/m. Physical Exam Constitutional:      General: She is not in acute distress.    Appearance: Normal appearance.  HENT:     Head: Normocephalic and atraumatic.     Comments: Right TM bulging, no erythema    Right Ear: External ear normal.     Left Ear: External ear normal.     Nose: Nose normal.     Mouth/Throat:     Mouth: Mucous membranes are moist.  Eyes:     Conjunctiva/sclera: Conjunctivae normal.  Cardiovascular:     Rate and Rhythm: Normal rate and regular rhythm.  Pulmonary:     Effort: Pulmonary effort is normal.     Breath sounds: Normal breath sounds.  Abdominal:     General: Bowel sounds are normal.     Palpations: Abdomen is soft.  Musculoskeletal:        General: Normal range of motion.     Cervical back: Normal range of motion.  Skin:    General: Skin is warm and dry.  Neurological:     General: No focal deficit present.     Mental Status: She is alert and oriented to person, place, and time.  Psychiatric:        Mood and Affect: Mood normal.        Behavior:  Behavior normal.        Thought Content: Thought content normal.        Judgment: Judgment normal.     Labs reviewed: Basic Metabolic Panel: Recent Labs    11/03/20 0931 05/07/21 0921  NA 135 136  K 4.3 3.8  CL 100 101  CO2 26 25  GLUCOSE 82 86  BUN 9 6*  CREATININE 0.79 0.80  CALCIUM 9.0 9.4  TSH 5.36* 5.50*   Liver Function Tests: Recent Labs    11/03/20 0931 05/07/21 0921  AST 16 20  ALT 13 15  BILITOT 0.7 1.1  PROT 6.7 6.9   No results for input(s): "LIPASE", "AMYLASE" in the last 8760 hours. No results for input(s): "AMMONIA" in the last 8760 hours. CBC: Recent Labs    11/03/20 0931 05/07/21 0921 08/08/21 1612  WBC 5.0 5.3 9.5  NEUTROABS 3,005 3,169 6,346  HGB 13.3 13.7 12.9  HCT 39.7 41.2 39.6  MCV 94.7 94.9 94.3  PLT 245 248 281   Lipid Panel: Recent Labs    11/03/20 0931 05/07/21 0921  CHOL 178 200*  HDL 66 66  LDLCALC 90 112*  TRIG 122 111  CHOLHDL 2.7 3.0   No results found for: "HGBA1C"  Procedures since last visit: No results found.  Assessment/Plan  1. Acute sinusitis, recurrence not specified, unspecified location -   continue Loratadine and Flonase -  continue Mucinex - azithromycin (ZITHROMAX) 250 MG tablet; Take 2 tablets on day 1, then 1 tablet daily on days 2 through 5  Dispense: 6 tablet; Refill: 0  2. AF (paroxysmal atrial fibrillation) (HCC) -  rate-controlled -  continue Diltiazem and Eliquis  -  e-prescription sent to [facility] - apixaban (ELIQUIS) 5 MG TABS tablet; Take 1 tablet (5 mg total) by mouth 2 (two) times daily.  Dispense: 60 tablet; Refill: 3  3. Slow transit constipation -   instructed to take warm prune juice with 30 ml MOM -  continue Probiotic and Miralax  Labs/tests ordered:  None  Next appt:  11/09/2021

## 2021-11-05 DIAGNOSIS — I739 Peripheral vascular disease, unspecified: Secondary | ICD-10-CM | POA: Diagnosis not present

## 2021-11-09 ENCOUNTER — Other Ambulatory Visit: Payer: Medicare Other

## 2021-11-09 DIAGNOSIS — F419 Anxiety disorder, unspecified: Secondary | ICD-10-CM

## 2021-11-09 DIAGNOSIS — I480 Paroxysmal atrial fibrillation: Secondary | ICD-10-CM

## 2021-11-09 DIAGNOSIS — R7989 Other specified abnormal findings of blood chemistry: Secondary | ICD-10-CM | POA: Diagnosis not present

## 2021-11-09 DIAGNOSIS — E785 Hyperlipidemia, unspecified: Secondary | ICD-10-CM

## 2021-11-10 LAB — COMPLETE METABOLIC PANEL WITH GFR
AG Ratio: 1.8 (calc) (ref 1.0–2.5)
ALT: 14 U/L (ref 6–29)
AST: 20 U/L (ref 10–35)
Albumin: 4.3 g/dL (ref 3.6–5.1)
Alkaline phosphatase (APISO): 81 U/L (ref 37–153)
BUN: 9 mg/dL (ref 7–25)
CO2: 29 mmol/L (ref 20–32)
Calcium: 9.5 mg/dL (ref 8.6–10.4)
Chloride: 103 mmol/L (ref 98–110)
Creat: 0.95 mg/dL (ref 0.60–0.95)
Globulin: 2.4 g/dL (calc) (ref 1.9–3.7)
Glucose, Bld: 86 mg/dL (ref 65–99)
Potassium: 5.2 mmol/L (ref 3.5–5.3)
Sodium: 139 mmol/L (ref 135–146)
Total Bilirubin: 0.9 mg/dL (ref 0.2–1.2)
Total Protein: 6.7 g/dL (ref 6.1–8.1)
eGFR: 58 mL/min/{1.73_m2} — ABNORMAL LOW (ref >=60)

## 2021-11-10 LAB — TSH: TSH: 5.04 mIU/L — ABNORMAL HIGH (ref 0.40–4.50)

## 2021-11-10 LAB — CBC WITH DIFFERENTIAL/PLATELET
Absolute Monocytes: 543 cells/uL (ref 200–950)
Basophils Absolute: 62 cells/uL (ref 0–200)
Basophils Relative: 1.1 %
Eosinophils Absolute: 101 cells/uL (ref 15–500)
Eosinophils Relative: 1.8 %
HCT: 38.6 % (ref 35.0–45.0)
Hemoglobin: 13.1 g/dL (ref 11.7–15.5)
Lymphs Abs: 1674 cells/uL (ref 850–3900)
MCH: 31.8 pg (ref 27.0–33.0)
MCHC: 33.9 g/dL (ref 32.0–36.0)
MCV: 93.7 fL (ref 80.0–100.0)
MPV: 9.3 fL (ref 7.5–12.5)
Monocytes Relative: 9.7 %
Neutro Abs: 3220 cells/uL (ref 1500–7800)
Neutrophils Relative %: 57.5 %
Platelets: 230 10*3/uL (ref 140–400)
RBC: 4.12 10*6/uL (ref 3.80–5.10)
RDW: 13.1 % (ref 11.0–15.0)
Total Lymphocyte: 29.9 %
WBC: 5.6 10*3/uL (ref 3.8–10.8)

## 2021-11-10 LAB — LIPID PANEL
Cholesterol: 168 mg/dL (ref <200)
HDL: 59 mg/dL (ref >=50)
LDL Cholesterol (Calc): 86 mg/dL (calc)
Non-HDL Cholesterol (Calc): 109 mg/dL (calc) (ref <130)
Total CHOL/HDL Ratio: 2.8 (calc) (ref <5.0)
Triglycerides: 129 mg/dL (ref <150)

## 2021-11-14 ENCOUNTER — Ambulatory Visit: Payer: Medicare Other | Admitting: Family

## 2021-11-16 ENCOUNTER — Ambulatory Visit: Payer: Medicare Other | Admitting: Family

## 2021-11-16 ENCOUNTER — Encounter: Payer: Self-pay | Admitting: Family

## 2021-11-16 VITALS — BP 122/80 | HR 58 | Temp 96.9°F | Resp 18 | Ht 62.0 in | Wt 163.6 lb

## 2021-11-16 DIAGNOSIS — I480 Paroxysmal atrial fibrillation: Secondary | ICD-10-CM | POA: Diagnosis not present

## 2021-11-16 DIAGNOSIS — N1831 Chronic kidney disease, stage 3a: Secondary | ICD-10-CM

## 2021-11-16 DIAGNOSIS — K5901 Slow transit constipation: Secondary | ICD-10-CM

## 2021-11-16 DIAGNOSIS — E785 Hyperlipidemia, unspecified: Secondary | ICD-10-CM | POA: Diagnosis not present

## 2021-11-16 NOTE — Progress Notes (Signed)
Provider: Marlowe Sax FNP-C   Brynden Thune, [name], NP  Patient Care Team: Sabryna Lahm, [name], NP as PCP - General (Family Medicine) Burnell Blanks, MD as PCP - Cardiology (Cardiology) Bjorn Loser, MD (Urology) Particia Nearing, MD (Dermatology) Luberta Mutter, MD (Ophthalmology) Almedia Balls, MD (Orthopedic Surgery) Ouida Sills  (Rheumatology) Jarome Matin, MD as Consulting Physician (Dermatology) Gatha Mayer, MD as Consulting Physician (Gastroenterology) Rozetta Nunnery, MD (Inactive) as Consulting Physician (Otolaryngology)  Extended Emergency Contact Information Primary Emergency Contact: Apple,Karen Address: [address]           [address] [name] of [place] Phone: [phone] Work Phone: [phone] Mobile Phone: [phone] Relation: Daughter  Code Status:  Full Code  Goals of care: Advanced Directive information    11/16/2021    2:11 PM  Advanced Directives  Does Patient Have a Medical Advance Directive? Yes  Type of Paramedic of [place];Living will  Does patient want to make changes to medical advance directive? No - Patient declined  Copy of [place] in Chart? Yes - validated most recent copy scanned in chart (See row information)     Chief Complaint  Patient presents with  . Medical Management of Chronic Issues    6 month follow up.  . Immunizations    Discuss the need for Covid Booster, and Infleunza vaccine.   . Labs     Review recent labs.    HPI:  Pt is a 88 y.o. female seen today for medical management of chronic diseases.   Constipation - getting worst  Has not been exercising on treadmill     Past Medical History:  Diagnosis Date  . Abdominal pain 12/2009   hospitalized  . Allergic rhinitis   . Anxiety   . Bladder problem   . Chronic constipation   . Compression fx, lumbar spine ([place])   . DDD (degenerative disc disease), lumbar   .  Diverticulosis of colon   . Dog bite(E906.0) 08/30/2011  . Dysrhythmia   . GERD (gastroesophageal reflux disease)   . Hepatic steatosis    pt denies  . Hyperlipidemia   . Hyperplastic colon polyp   . IBS (irritable bowel syndrome)    constipation predominant  . Internal hemorrhoids   . Osteoporosis    dexa 2011 -2.7 hip nl spine  . PAF (paroxysmal atrial fibrillation) ([place])    a. dx 05/2017.  Marland Kitchen PMR (polymyalgia rheumatica) (HCC) 08/30/2011   under rheum care  on low dose pred 5 mg   . Renal disorder    Past Surgical History:  Procedure Laterality Date  . ABDOMINAL HYSTERECTOMY     with bladder tact and rectocele repair  . BLADDER SURGERY     Streched  . CATARACT EXTRACTION  2010   both  . COLONOSCOPY  2016 was most recent  . CYSTO WITH HYDRODISTENSION N/A 10/05/2019   Procedure: CYSTOSCOPY/HYDRODISTENSION;  Surgeon: Bjorn Loser, MD;  Location: [facility];  Service: Urology;  Laterality: N/A;  . TONSILLECTOMY AND ADENOIDECTOMY      Allergies  Allergen Reactions  . Alendronate Sodium     upset stomach  . Penicillins     Has patient had a PCN reaction causing immediate rash, facial/tongue/throat swelling, SOB or lightheadedness with hypotension: Yes Has patient had a PCN reaction causing severe rash involving mucus membranes or skin necrosis: No Has patient had a PCN reaction that required hospitalization: No Has patient had a PCN reaction occurring within the last 10  years: No If all of the above answers are "NO", then may proceed with Cephalosporin use.   Marland Kitchen Amoxicillin Rash    Has patient had a PCN reaction causing immediate rash, facial/tongue/throat swelling, SOB or lightheadedness with hypotension: yes Has patient had a PCN reaction causing severe rash involving mucus membranes or skin necrosis: no Has patient had a PCN reaction that required hospitalization: no Has patient had PCN reaction within the last 10 years: no  If all of the above answers are "NO", then may  proceed with Cephalosporin use.  REACTION: unspecified  . Metronidazole Other (See Comments)    Pounding headaches patient says was bad.  With cipro for diverticulitis rx. Other reaction(s): Other (See Comments) Pounding headaches patient says was bad.  With cipro for diverticulitis rx.    Allergies as of 11/16/2021       Reactions   Alendronate Sodium    upset stomach   Penicillins    Has patient had a PCN reaction causing immediate rash, facial/tongue/throat swelling, SOB or lightheadedness with hypotension: Yes Has patient had a PCN reaction causing severe rash involving mucus membranes or skin necrosis: No Has patient had a PCN reaction that required hospitalization: No Has patient had a PCN reaction occurring within the last 10 years: No If all of the above answers are "NO", then may proceed with Cephalosporin use.   Amoxicillin Rash   Has patient had a PCN reaction causing immediate rash, facial/tongue/throat swelling, SOB or lightheadedness with hypotension: yes Has patient had a PCN reaction causing severe rash involving mucus membranes or skin necrosis: no Has patient had a PCN reaction that required hospitalization: no Has patient had PCN reaction within the last 10 years: no  If all of the above answers are "NO", then may proceed with Cephalosporin use. REACTION: unspecified   Metronidazole Other (See Comments)   Pounding headaches patient says was bad.  With cipro for diverticulitis rx. Other reaction(s): Other (See Comments) Pounding headaches patient says was bad.  With cipro for diverticulitis rx.        Medication List        Accurate as of November 16, 2021  3:07 PM. If you have any questions, ask your nurse or doctor.          acetaminophen 500 MG tablet Commonly known as: TYLENOL Take 500 mg by mouth at bedtime as needed.   Align 4 MG Caps Take 1 capsule by mouth as needed.   apixaban 5 MG Tabs tablet Commonly known as: ELIQUIS Take 1 tablet (5 mg  total) by mouth 2 (two) times daily.   calcium carbonate 1250 (500 Ca) MG chewable tablet Commonly known as: OS-CAL Chew 1 tablet by mouth daily.   diltiazem 120 MG 24 hr capsule Commonly known as: CARDIZEM CD TAKE 1 CAPSULE BY MOUTH EVERY DAY   escitalopram 5 MG tablet Commonly known as: LEXAPRO TAKE 1 TABLET BY MOUTH EVERYDAY AT BEDTIME   fluticasone 50 MCG/ACT nasal spray Commonly known as: FLONASE Place 2 sprays into both nostrils daily.   guaiFENesin 600 MG 12 hr tablet Commonly known as: MUCINEX Take 600 mg by mouth daily as needed.   loratadine 10 MG tablet Commonly known as: CLARITIN TAKE 1 TABLET BY MOUTH EVERY DAY   multivitamin tablet Take 1 tablet by mouth daily.   polyethylene glycol powder 17 GM/SCOOP powder Commonly known as: GLYCOLAX/MIRALAX Take 17 g by mouth daily. Hold for loose stool   Premarin vaginal cream Generic drug: conjugated estrogens  INSERT 1 GRAM VAGINALLY THREE TIMES A WEEKLY, THEN ONCE WEEKLY   Restasis 0.05 % ophthalmic emulsion Generic drug: cycloSPORINE Place 1 drop into both eyes 2 (two) times daily.   vitamin C 100 MG tablet Take 500 mg by mouth daily.        Review of Systems  Immunization History  Administered Date(s) Administered  . Fluad Quad(high Dose 65+) 01/10/2020  . Influenza Split 01/25/2011, 01/23/2012  . Influenza Whole 12/31/2007  . Influenza, High Dose Seasonal PF 01/19/2013, 01/10/2014, 01/17/2015, 01/18/2016, 01/02/2017, 01/05/2018, 01/05/2019, 01/05/2019, 02/07/2021  . Influenza-Unspecified 01/02/2017  . PFIZER(Purple Top)SARS-COV-2 Vaccination 05/07/2019, 06/02/2019, 01/31/2020, 08/02/2020  . Pension scheme manager 37yr & up 02/19/2021  . Pneumococcal Conjugate-13 02/23/2013  . Pneumococcal Polysaccharide-23 04/01/2006, 04/01/2018  . Td 04/01/2005  . Td (Adult) 11/22/2016  . Tdap 08/30/2011, 08/03/2021  . Zoster Recombinat (Shingrix) 12/04/2016, 07/27/2017  . Zoster, Live  04/02/2007   Pertinent  Health Maintenance Due  Topic Date Due  . INFLUENZA VACCINE  10/30/2021  . MAMMOGRAM  09/09/2022  . DEXA SCAN  Completed      05/10/2021    4:52 PM 08/03/2021    2:27 PM 08/08/2021    3:12 PM 09/03/2021    1:14 PM 11/16/2021    2:10 PM  FAberdeen Proving Groundin the past year? 0  1 1 0  Was there an injury with Fall? 0  1 1 0  Was there an injury with Fall? - Comments   Hit head    Fall Risk Category Calculator 0  2 2 0  Fall Risk Category Low  Moderate Moderate Low  Patient Fall Risk Level Low fall risk Low fall risk Moderate fall risk Moderate fall risk Low fall risk  Patient at Risk for Falls Due to No Fall Risks  History of fall(s) History of fall(s) No Fall Risks  Fall risk Follow up Falls evaluation completed  Falls evaluation completed Falls evaluation completed;Education provided;Falls prevention discussed Falls evaluation completed   Functional Status Survey:    Vitals:   11/16/21 1403  BP: 122/80  Pulse: (!) 58  Resp: 18  Temp: (!) 96.9 F (36.1 C)  SpO2: 98%  Weight: 163 lb 9.6 oz (74.2 kg)  Height: 5' 2"  (1.575 m)   Body mass index is 29.92 kg/m. Physical Exam  Labs reviewed: Recent Labs    05/07/21 0921 11/09/21 0955  NA 136 139  K 3.8 5.2  CL 101 103  CO2 25 29  GLUCOSE 86 86  BUN 6* 9  CREATININE 0.80 0.95  CALCIUM 9.4 9.5   Recent Labs    05/07/21 0921 11/09/21 0955  AST 20 20  ALT 15 14  BILITOT 1.1 0.9  PROT 6.9 6.7   Recent Labs    05/07/21 0921 08/08/21 1612 11/09/21 0955  WBC 5.3 9.5 5.6  NEUTROABS 3,169 6,346 3,220  HGB 13.7 12.9 13.1  HCT 41.2 39.6 38.6  MCV 94.9 94.3 93.7  PLT 248 281 230   Lab Results  Component Value Date   TSH 5.04 (H) 11/09/2021   No results found for: "HGBA1C" Lab Results  Component Value Date   CHOL 168 11/09/2021   HDL 59 11/09/2021   LDLCALC 86 11/09/2021   LDLDIRECT 100.0 04/07/2019   TRIG 129 11/09/2021   CHOLHDL 2.8 11/09/2021    Significant Diagnostic Results in  last 30 days:  No results found.  Assessment/Plan There are no diagnoses linked to this encounter.   Family/ staff Communication:  Reviewed plan of care with patient  Labs/tests ordered: None   Next Appointment :   Sandrea Hughs, NP

## 2021-11-16 NOTE — Patient Instructions (Signed)
Please get COVID-19 booster vaccine at the pharmacy.  - Also get Flu shot in the fall,2023

## 2021-12-10 ENCOUNTER — Encounter: Payer: Self-pay | Admitting: Adult Health

## 2021-12-10 ENCOUNTER — Ambulatory Visit (INDEPENDENT_AMBULATORY_CARE_PROVIDER_SITE_OTHER): Payer: Medicare Other | Admitting: Adult Health

## 2021-12-10 VITALS — BP 110/70 | HR 79 | Temp 97.5°F | Resp 18 | Ht 62.0 in | Wt 161.0 lb

## 2021-12-10 DIAGNOSIS — N1831 Chronic kidney disease, stage 3a: Secondary | ICD-10-CM

## 2021-12-10 DIAGNOSIS — I480 Paroxysmal atrial fibrillation: Secondary | ICD-10-CM | POA: Diagnosis not present

## 2021-12-10 DIAGNOSIS — T148XXA Other injury of unspecified body region, initial encounter: Secondary | ICD-10-CM | POA: Diagnosis not present

## 2021-12-10 DIAGNOSIS — Z23 Encounter for immunization: Secondary | ICD-10-CM | POA: Diagnosis not present

## 2021-12-10 DIAGNOSIS — K5901 Slow transit constipation: Secondary | ICD-10-CM

## 2021-12-10 NOTE — Patient Instructions (Signed)
Hematoma A hematoma is a collection of blood. A hematoma can happen: Under the skin. In an organ. In a body space. In a joint space. In other tissues. The blood can thicken (clot) to form a lump that you can see and feel. The lump is often hard and may become sore and tender. The lump can be very small or very big. Most hematomas get better in a few days to weeks. However, some hematomas may be serious and need medical care. What are the causes? This condition is caused by: An injury. Blood that leaks under the skin. Problems from surgeries. Medical conditions that cause bleeding or bruising. What increases the risk? You are more likely to develop this condition if: You are an older adult. You use medicines that thin your blood. What are the signs or symptoms? Symptoms depend on where the hematoma is in your body. If the hematoma is under the skin, there is: A firm lump on the body. Pain and tenderness in the area. Bruising. The skin above the lump may be blue, dark blue, purple-red, or yellowish. If the hematoma is deep in the tissues or body spaces, there may be: Blood in the stomach. This may cause pain in the belly (abdomen), weakness, passing out (fainting), and shortness of breath. Blood in the head. This may cause a headache, weakness, trouble speaking or understanding speech, or passing out. How is this diagnosed? This condition is diagnosed based on: Your medical history. A physical exam. Imaging tests, such as ultrasound or CT scan. Blood tests. How is this treated? Treatment depends on the cause, size, and location of the hematoma. Treatment may include: Doing nothing. Many hematomas go away on their own without treatment. Surgery or close monitoring. This may be needed for large hematomas or hematomas that affect the body's organs. Medicines. These may be given if a medical condition caused the hematoma. Follow these instructions at home: Managing pain, stiffness,  and swelling  If told, put ice on the area. Put ice in a plastic bag. Place a towel between your skin and the bag. Leave the ice on for 20 minutes, 2-3 times a day for the first two days. If told, put heat on the affected area after putting ice on the area for two days. Use the heat source that your doctor tells you to use. This could be a moist heat pack or a heating pad. To do this: Place a towel between your skin and the heat source. Leave the heat on for 20-30 minutes. Remove the heat if your skin turns bright red. This is very important if you are unable to feel pain, heat, or cold. You may have a greater risk of getting burned. Raise (elevate) the affected area above the level of your heart while you are sitting or lying down. Wrap the affected area with an elastic bandage, if told by your doctor. Do not wrap the bandage too tightly. If your hematoma is on a leg or foot and is painful, your doctor may give you crutches. Use them as told by your doctor. General instructions Take over-the-counter and prescription medicines only as told by your doctor. Keep all follow-up visits as told by your doctor. This is important. Contact a doctor if: You have a fever. The swelling or bruising gets worse. You start to get more hematomas. Get help right away if: Your pain gets worse. Your pain is not getting better with medicine. Your skin over the hematoma breaks or starts to bleed.  Your hematoma is in your chest or belly and you: Pass out. Feel weak. Become short of breath. You have a hematoma on your scalp that is caused by a fall or injury, and you: Have a headache that gets worse. Have trouble speaking or understanding speech. Become less alert or you pass out. Summary A hematoma is a collection of blood in any part of your body. Most hematomas get better on their own in a few days to weeks. Some may need medical care. Follow instructions from your doctor about how to care for your  hematoma. Contact a doctor if the swelling or bruising gets worse, or if you are short of breath. This information is not intended to replace advice given to you by your health care provider. Make sure you discuss any questions you have with your health care provider. Document Revised: 01/11/2021 Document Reviewed: 01/11/2021 Elsevier Patient Education  [place].

## 2021-12-10 NOTE — Progress Notes (Signed)
[facility] clinic  Provider: Durenda Age  DNP  Code Status:  Full Code  Goals of Care:     11/16/2021    2:11 PM  Advanced Directives  Does Patient Have a Medical Advance Directive? Yes  Type of Paramedic of [place];Living will  Does patient want to make changes to medical advance directive? No - Patient declined  Copy of [place] in Chart? Yes - validated most recent copy scanned in chart (See row information)     Chief Complaint  Patient presents with   Acute Visit    Patient is here for a bruising on lower left extremity. Patient is on Eliquis. Does not recall bumping or injuring it      HPI: Patient is a 88 y.o. female seen today for an acute visit for bruising on her left shin. She woke up this morning with the bruise. She is currently taking Eliquis 5 mg BID for anticoagulation for her paroxysmal atrial fibrillation.  She denies having fallen or bumping her left lower leg. She denies pain on the area.   She is due to have flu vaccine. Latest GFR 58, which is ranging in CKD stage 3a.  Past Medical History:  Diagnosis Date   Abdominal pain 12/2009   hospitalized   Allergic rhinitis    Anxiety    Bladder problem    Chronic constipation    Compression fx, lumbar spine (HCC)    DDD (degenerative disc disease), lumbar    Diverticulosis of colon    Dog bite(E906.0) 08/30/2011   Dysrhythmia    GERD (gastroesophageal reflux disease)    Hepatic steatosis    pt denies   Hyperlipidemia    Hyperplastic colon polyp    IBS (irritable bowel syndrome)    constipation predominant   Internal hemorrhoids    Osteoporosis    dexa 2011 -2.7 hip nl spine   PAF (paroxysmal atrial fibrillation) ([place])    a. dx 05/2017.   PMR (polymyalgia rheumatica) ([place]) 08/30/2011   under rheum care  on low dose pred 5 mg    Renal disorder     Past Surgical History:  Procedure Laterality Date   ABDOMINAL HYSTERECTOMY     with bladder tact and  rectocele repair   BLADDER SURGERY     Streched   CATARACT EXTRACTION  2010   both   COLONOSCOPY  2016 was most recent   CYSTO WITH HYDRODISTENSION N/A 10/05/2019   Procedure: CYSTOSCOPY/HYDRODISTENSION;  Surgeon: Bjorn Loser, MD;  Location: [facility];  Service: Urology;  Laterality: N/A;   TONSILLECTOMY AND ADENOIDECTOMY      Allergies  Allergen Reactions   Alendronate Sodium     upset stomach   Penicillins     Has patient had a PCN reaction causing immediate rash, facial/tongue/throat swelling, SOB or lightheadedness with hypotension: Yes Has patient had a PCN reaction causing severe rash involving mucus membranes or skin necrosis: No Has patient had a PCN reaction that required hospitalization: No Has patient had a PCN reaction occurring within the last 10 years: No If all of the above answers are "NO", then may proceed with Cephalosporin use.    Amoxicillin Rash    Has patient had a PCN reaction causing immediate rash, facial/tongue/throat swelling, SOB or lightheadedness with hypotension: yes Has patient had a PCN reaction causing severe rash involving mucus membranes or skin necrosis: no Has patient had a PCN reaction that required hospitalization: no Has patient had PCN reaction within  the last 10 years: no  If all of the above answers are "NO", then may proceed with Cephalosporin use.  REACTION: unspecified   Metronidazole Other (See Comments)    Pounding headaches patient says was bad.  With cipro for diverticulitis rx. Other reaction(s): Other (See Comments) Pounding headaches patient says was bad.  With cipro for diverticulitis rx.    Outpatient Encounter Medications as of 12/10/2021  Medication Sig   acetaminophen (TYLENOL) 500 MG tablet Take 500 mg by mouth at bedtime as needed.   apixaban (ELIQUIS) 5 MG TABS tablet Take 1 tablet (5 mg total) by mouth 2 (two) times daily.   Ascorbic Acid (VITAMIN C) 100 MG tablet Take 500 mg by mouth daily.   calcium carbonate  (OS-CAL) 1250 (500 Ca) MG chewable tablet Chew 1 tablet by mouth daily.    diltiazem (CARDIZEM CD) 120 MG 24 hr capsule TAKE 1 CAPSULE BY MOUTH EVERY DAY   escitalopram (LEXAPRO) 5 MG tablet TAKE 1 TABLET BY MOUTH EVERYDAY AT BEDTIME   fluticasone (FLONASE) 50 MCG/ACT nasal spray Place 2 sprays into both nostrils daily.   guaiFENesin (MUCINEX) 600 MG 12 hr tablet Take 600 mg by mouth daily as needed.   loratadine (CLARITIN) 10 MG tablet TAKE 1 TABLET BY MOUTH EVERY DAY   Multiple Vitamin (MULTIVITAMIN) tablet Take 1 tablet by mouth daily.   polyethylene glycol powder (GLYCOLAX/MIRALAX) 17 GM/SCOOP powder Take 17 g by mouth daily. Hold for loose stool   PREMARIN vaginal cream INSERT 1 GRAM VAGINALLY THREE TIMES A WEEKLY, THEN ONCE WEEKLY   Probiotic Product (ALIGN) 4 MG CAPS Take 1 capsule by mouth as needed.   RESTASIS 0.05 % ophthalmic emulsion Place 1 drop into both eyes 2 (two) times daily.   No facility-administered encounter medications on file as of 12/10/2021.    Review of Systems:  Review of Systems  Constitutional:  Negative for appetite change, chills, fatigue and fever.  HENT:  Negative for congestion, hearing loss, rhinorrhea and sore throat.   Eyes: Negative.   Respiratory:  Negative for cough, shortness of breath and wheezing.   Cardiovascular:  Negative for chest pain, palpitations and leg swelling.  Gastrointestinal:  Negative for abdominal pain, constipation, diarrhea, nausea and vomiting.  Genitourinary:  Negative for dysuria.  Musculoskeletal:  Negative for arthralgias, back pain and myalgias.  Skin:  Negative for color change, rash and wound.       Woke up this morning with a bruise  Neurological:  Negative for dizziness, weakness and headaches.  Psychiatric/Behavioral:  Negative for behavioral problems. The patient is not nervous/anxious.     Health Maintenance  Topic Date Due   COVID-19 Vaccine (6 - Pfizer risk series) 04/16/2021   INFLUENZA VACCINE  10/30/2021    MAMMOGRAM  09/09/2022   TETANUS/TDAP  08/04/2031   Pneumonia Vaccine 26+ Years old  Completed   DEXA SCAN  Completed   Zoster Vaccines- Shingrix  Completed   HPV VACCINES  Aged Out    Physical Exam: Vitals:   12/10/21 1512  BP: 110/70  Pulse: 79  Resp: 18  Temp: (!) 97.5 F (36.4 C)  TempSrc: Temporal  SpO2: 95%  Weight: 161 lb (73 kg)  Height: 5' 2"  (1.575 m)   Body mass index is 29.45 kg/m. Physical Exam Constitutional:      Appearance: Normal appearance.  HENT:     Head: Normocephalic and atraumatic.     Nose: Nose normal.     Mouth/Throat:     Mouth:  Mucous membranes are moist.  Eyes:     Conjunctiva/sclera: Conjunctivae normal.  Cardiovascular:     Rate and Rhythm: Normal rate. Rhythm irregular.  Pulmonary:     Effort: Pulmonary effort is normal.     Breath sounds: Normal breath sounds.  Abdominal:     General: Bowel sounds are normal.     Palpations: Abdomen is soft.  Musculoskeletal:        General: Normal range of motion.     Cervical back: Normal range of motion.  Skin:    General: Skin is warm and dry.     Comments: Left shin with hematoma.  Neurological:     General: No focal deficit present.     Mental Status: She is alert and oriented to person, place, and time.  Psychiatric:        Mood and Affect: Mood normal.        Behavior: Behavior normal.        Thought Content: Thought content normal.        Judgment: Judgment normal.     Labs reviewed: Basic Metabolic Panel: Recent Labs    05/07/21 0921 11/09/21 0955  NA 136 139  K 3.8 5.2  CL 101 103  CO2 25 29  GLUCOSE 86 86  BUN 6* 9  CREATININE 0.80 0.95  CALCIUM 9.4 9.5  TSH 5.50* 5.04*   Liver Function Tests: Recent Labs    05/07/21 0921 11/09/21 0955  AST 20 20  ALT 15 14  BILITOT 1.1 0.9  PROT 6.9 6.7   No results for input(s): "LIPASE", "AMYLASE" in the last 8760 hours. No results for input(s): "AMMONIA" in the last 8760 hours. CBC: Recent Labs    05/07/21 0921  08/08/21 1612 11/09/21 0955  WBC 5.3 9.5 5.6  NEUTROABS 3,169 6,346 3,220  HGB 13.7 12.9 13.1  HCT 41.2 39.6 38.6  MCV 94.9 94.3 93.7  PLT 248 281 230   Lipid Panel: Recent Labs    05/07/21 0921 11/09/21 0955  CHOL 200* 168  HDL 66 59  LDLCALC 112* 86  TRIG 111 129  CHOLHDL 3.0 2.8   No results found for: "HGBA1C"  Procedures since last visit: No results found.  Assessment/Plan  1. Hematoma -  discussed that she is at risk for bruising due to Eliquis -  avoid bumping skin -  monitor for additional bruising  2. AF (paroxysmal atrial fibrillation) (HCC) -  rate controlled, continue Eliquis for anticoagulation and  Cardizem for rate-control  3. Stage 3a chronic kidney disease [facility]) Lab Results  Component Value Date   NA 139 11/09/2021   K 5.2 11/09/2021   CO2 29 11/09/2021   GLUCOSE 86 11/09/2021   BUN 9 11/09/2021   CREATININE 0.95 11/09/2021   CALCIUM 9.5 11/09/2021   EGFR 58 (L) 11/09/2021   GFRNONAA 73 06/30/2020   -  stable  4. Influenza vaccine administered - Flu Vaccine QUAD High Dose(Fluad)    Labs/tests ordered:   None  Next appt:  12/18/2021 --

## 2021-12-18 ENCOUNTER — Encounter: Payer: Medicare Other | Admitting: Family

## 2021-12-21 ENCOUNTER — Encounter: Payer: Self-pay | Admitting: Adult Health

## 2021-12-21 ENCOUNTER — Ambulatory Visit (INDEPENDENT_AMBULATORY_CARE_PROVIDER_SITE_OTHER): Payer: Medicare Other | Admitting: Adult Health

## 2021-12-21 ENCOUNTER — Telehealth: Payer: Self-pay | Admitting: Adult Health

## 2021-12-21 VITALS — BP 134/82 | HR 76 | Temp 97.3°F | Resp 18 | Ht 62.0 in | Wt 161.0 lb

## 2021-12-21 DIAGNOSIS — I480 Paroxysmal atrial fibrillation: Secondary | ICD-10-CM | POA: Diagnosis not present

## 2021-12-21 DIAGNOSIS — R058 Other specified cough: Secondary | ICD-10-CM | POA: Diagnosis not present

## 2021-12-21 MED ORDER — BENZONATATE 100 MG PO CAPS: 100.0000 mg | ORAL_CAPSULE | Freq: Three times a day (TID) | ORAL | 0 refills | Status: DC | PRN

## 2021-12-21 NOTE — Telephone Encounter (Signed)
Ms. Phill Myron called to report NEGATIVE result from home COVID-19 test.

## 2021-12-21 NOTE — Patient Instructions (Signed)

## 2021-12-21 NOTE — Progress Notes (Signed)
[facility]  Provider:   Durenda Age DNP  Code Status:  Full Code  Goals of Care:     11/16/2021    2:11 PM  Advanced Directives  Does Patient Have a Medical Advance Directive? Yes  Type of Paramedic of [place];Living will  Does patient want to make changes to medical advance directive? No - Patient declined  Copy of [place] in Chart? Yes - validated most recent copy scanned in chart (See row information)     Chief Complaint  Patient presents with   Acute Visit    Patient is being seen for exposure to covid with headache, nausea, and diarrhea     HPI: Patient is a 88 y.o. female seen today for an acute visit for exposure to COVID-19 from her great granddaughter, 2 days ago. She now complains of headache, nausea and diarrhea. She stated that she had diarrhea yesterday because she took a laxative. She was accompanied today by her daughter. She has dry cough which started today. COVID-19 test was done today in the clinic and sent to lab. She has 4 COVID-19 vaccines.  After the clinic visit today, daughter did a COVID-19 test at home and was negative.  She takes Eliquis and Cardizem for atrial fibrillation.   Past Medical History:  Diagnosis Date   Abdominal pain 12/2009   hospitalized   Allergic rhinitis    Anxiety    Bladder problem    Chronic constipation    Compression fx, lumbar spine (HCC)    DDD (degenerative disc disease), lumbar    Diverticulosis of colon    Dog bite(E906.0) 08/30/2011   Dysrhythmia    GERD (gastroesophageal reflux disease)    Hepatic steatosis    pt denies   Hyperlipidemia    Hyperplastic colon polyp    IBS (irritable bowel syndrome)    constipation predominant   Internal hemorrhoids    Osteoporosis    dexa 2011 -2.7 hip nl spine   PAF (paroxysmal atrial fibrillation) ([place])    a. dx 05/2017.   PMR (polymyalgia rheumatica) ([place]) 08/30/2011   under rheum care  on low dose pred 5 mg     Renal disorder     Past Surgical History:  Procedure Laterality Date   ABDOMINAL HYSTERECTOMY     with bladder tact and rectocele repair   BLADDER SURGERY     Streched   CATARACT EXTRACTION  2010   both   COLONOSCOPY  2016 was most recent   CYSTO WITH HYDRODISTENSION N/A 10/05/2019   Procedure: CYSTOSCOPY/HYDRODISTENSION;  Surgeon: Bjorn Loser, MD;  Location: [facility];  Service: Urology;  Laterality: N/A;   TONSILLECTOMY AND ADENOIDECTOMY      Allergies  Allergen Reactions   Alendronate Sodium     upset stomach   Penicillins     Has patient had a PCN reaction causing immediate rash, facial/tongue/throat swelling, SOB or lightheadedness with hypotension: Yes Has patient had a PCN reaction causing severe rash involving mucus membranes or skin necrosis: No Has patient had a PCN reaction that required hospitalization: No Has patient had a PCN reaction occurring within the last 10 years: No If all of the above answers are "NO", then may proceed with Cephalosporin use.    Amoxicillin Rash    Has patient had a PCN reaction causing immediate rash, facial/tongue/throat swelling, SOB or lightheadedness with hypotension: yes Has patient had a PCN reaction causing severe rash involving mucus membranes or skin necrosis: no Has  patient had a PCN reaction that required hospitalization: no Has patient had PCN reaction within the last 10 years: no  If all of the above answers are "NO", then may proceed with Cephalosporin use.  REACTION: unspecified   Metronidazole Other (See Comments)    Pounding headaches patient says was bad.  With cipro for diverticulitis rx. Other reaction(s): Other (See Comments) Pounding headaches patient says was bad.  With cipro for diverticulitis rx.    Outpatient Encounter Medications as of 12/21/2021  Medication Sig   benzonatate (TESSALON PERLES) 100 MG capsule Take 1 capsule (100 mg total) by mouth 3 (three) times daily as needed for cough.   acetaminophen  (TYLENOL) 500 MG tablet Take 500 mg by mouth at bedtime as needed.   apixaban (ELIQUIS) 5 MG TABS tablet Take 1 tablet (5 mg total) by mouth 2 (two) times daily.   Ascorbic Acid (VITAMIN C) 100 MG tablet Take 500 mg by mouth daily.   calcium carbonate (OS-CAL) 1250 (500 Ca) MG chewable tablet Chew 1 tablet by mouth daily.    diltiazem (CARDIZEM CD) 120 MG 24 hr capsule TAKE 1 CAPSULE BY MOUTH EVERY DAY   escitalopram (LEXAPRO) 5 MG tablet TAKE 1 TABLET BY MOUTH EVERYDAY AT BEDTIME   fluticasone (FLONASE) 50 MCG/ACT nasal spray Place 2 sprays into both nostrils daily.   guaiFENesin (MUCINEX) 600 MG 12 hr tablet Take 600 mg by mouth daily as needed.   loratadine (CLARITIN) 10 MG tablet TAKE 1 TABLET BY MOUTH EVERY DAY   Multiple Vitamin (MULTIVITAMIN) tablet Take 1 tablet by mouth daily.   polyethylene glycol powder (GLYCOLAX/MIRALAX) 17 GM/SCOOP powder Take 17 g by mouth daily. Hold for loose stool   PREMARIN vaginal cream INSERT 1 GRAM VAGINALLY THREE TIMES A WEEKLY, THEN ONCE WEEKLY   Probiotic Product (ALIGN) 4 MG CAPS Take 1 capsule by mouth as needed.   RESTASIS 0.05 % ophthalmic emulsion Place 1 drop into both eyes 2 (two) times daily.   No facility-administered encounter medications on file as of 12/21/2021.    Review of Systems:  Review of Systems  Constitutional:  Positive for appetite change. Negative for chills, fatigue and fever.       Poor appetite.  HENT:  Negative for congestion, hearing loss, rhinorrhea and sore throat.   Eyes: Negative.   Respiratory:  Positive for cough. Negative for shortness of breath and wheezing.        Dry cough  Cardiovascular:  Negative for chest pain, palpitations and leg swelling.  Gastrointestinal:  Negative for abdominal pain, constipation, diarrhea, nausea and vomiting.  Genitourinary:  Negative for dysuria.  Musculoskeletal:  Negative for arthralgias, back pain and myalgias.  Skin:  Negative for color change, rash and wound.  Neurological:   Positive for headaches. Negative for dizziness and weakness.  Psychiatric/Behavioral:  Negative for behavioral problems. The patient is not nervous/anxious.     Health Maintenance  Topic Date Due   COVID-19 Vaccine (6 - Pfizer risk series) 04/16/2021   MAMMOGRAM  09/09/2022   TETANUS/TDAP  08/04/2031   Pneumonia Vaccine 36+ Years old  Completed   INFLUENZA VACCINE  Completed   DEXA SCAN  Completed   Zoster Vaccines- Shingrix  Completed   HPV VACCINES  Aged Out    Physical Exam: Vitals:   12/21/21 1422  BP: 134/82  Pulse: 76  Resp: 18  Temp: (!) 97.3 F (36.3 C)  SpO2: 91%  Weight: 161 lb (73 kg)  Height: 5' 2"  (1.575 m)  Body mass index is 29.45 kg/m. Physical Exam Constitutional:      Appearance: Normal appearance.  HENT:     Head: Normocephalic and atraumatic.     Nose: Nose normal.     Mouth/Throat:     Mouth: Mucous membranes are moist.  Eyes:     Conjunctiva/sclera: Conjunctivae normal.  Cardiovascular:     Rate and Rhythm: Normal rate. Rhythm irregular.  Pulmonary:     Effort: Pulmonary effort is normal.     Breath sounds: Normal breath sounds.  Abdominal:     General: Bowel sounds are normal.     Palpations: Abdomen is soft.  Musculoskeletal:        General: Normal range of motion.     Cervical back: Normal range of motion.  Skin:    General: Skin is warm and dry.  Neurological:     General: No focal deficit present.     Mental Status: She is alert and oriented to person, place, and time.  Psychiatric:        Mood and Affect: Mood normal.        Behavior: Behavior normal.        Thought Content: Thought content normal.        Judgment: Judgment normal.     Labs reviewed: Basic Metabolic Panel: Recent Labs    05/07/21 0921 11/09/21 0955  NA 136 139  K 3.8 5.2  CL 101 103  CO2 25 29  GLUCOSE 86 86  BUN 6* 9  CREATININE 0.80 0.95  CALCIUM 9.4 9.5  TSH 5.50* 5.04*   Liver Function Tests: Recent Labs    05/07/21 0921 11/09/21 0955   AST 20 20  ALT 15 14  BILITOT 1.1 0.9  PROT 6.9 6.7   No results for input(s): "LIPASE", "AMYLASE" in the last 8760 hours. No results for input(s): "AMMONIA" in the last 8760 hours. CBC: Recent Labs    05/07/21 0921 08/08/21 1612 11/09/21 0955  WBC 5.3 9.5 5.6  NEUTROABS 3,169 6,346 3,220  HGB 13.7 12.9 13.1  HCT 41.2 39.6 38.6  MCV 94.9 94.3 93.7  PLT 248 281 230   Lipid Panel: Recent Labs    05/07/21 0921 11/09/21 0955  CHOL 200* 168  HDL 66 59  LDLCALC 112* 86  TRIG 111 129  CHOLHDL 3.0 2.8   No results found for: "HGBA1C"  Procedures since last visit: No results found.  Assessment/Plan  1. Dry cough -  did a home test for COVID-19 and was negative - benzonatate (TESSALON PERLES) 100 MG capsule; Take 1 capsule (100 mg total) by mouth 3 (three) times daily as needed for cough.  Dispense: 20 capsule; Refill: 0 - SARS-COV-2 RNA,(COVID-19) QUAL NAAT  2. AF (paroxysmal atrial fibrillation) ([place]) -  rate-controlled, continue Eliquis and Cardizem    Labs/tests ordered:   SARS-COV-2 RNA,(COVID-19) QUAL NAAT  Next appt:  01/01/2022

## 2021-12-26 ENCOUNTER — Other Ambulatory Visit: Payer: Self-pay | Admitting: Family

## 2021-12-26 DIAGNOSIS — J019 Acute sinusitis, unspecified: Secondary | ICD-10-CM

## 2022-01-01 ENCOUNTER — Encounter: Payer: Self-pay | Admitting: Family

## 2022-01-01 ENCOUNTER — Telehealth: Payer: Self-pay

## 2022-01-01 ENCOUNTER — Ambulatory Visit (INDEPENDENT_AMBULATORY_CARE_PROVIDER_SITE_OTHER): Payer: Medicare Other | Admitting: Family

## 2022-01-01 VITALS — Temp 97.3°F | Ht 62.0 in | Wt 160.0 lb

## 2022-01-01 DIAGNOSIS — Z0000 Encounter for general adult medical examination without abnormal findings: Secondary | ICD-10-CM

## 2022-01-01 NOTE — Patient Instructions (Signed)
Ashley Lawson , Thank you for taking time to come for your Medicare Wellness Visit. I appreciate your ongoing commitment to your health goals. Please review the following plan we discussed and let me know if I can assist you in the future.   Screening recommendations/referrals: Colonoscopy N/A Mammogram N/A Bone Density : Up to date  Recommended yearly ophthalmology/optometry visit for glaucoma screening and checkup Recommended yearly dental visit for hygiene and checkup  Vaccinations: Influenza vaccine- due annually in September/October Pneumococcal vaccine : Up to date  Tdap vaccine : Up to date  Shingles vaccine : Up to date     Advanced directives: Yes   Conditions/risks identified: Cardiac Risk Factors include: advanced age (>59mn, >>38women);hypertension;obesity (BMI >30kg/m2)  Next appointment: 1 year    Preventive Care 665Years and Older, Female Preventive care refers to lifestyle choices and visits with your health care provider that can promote health and wellness. What does preventive care include? A yearly physical exam. This is also called an annual well check. Dental exams once or twice a year. Routine eye exams. Ask your health care provider how often you should have your eyes checked. Personal lifestyle choices, including: Daily care of your teeth and gums. Regular physical activity. Eating a healthy diet. Avoiding tobacco and drug use. Limiting alcohol use. Practicing safe sex. Taking low-dose aspirin every day. Taking vitamin and mineral supplements as recommended by your health care provider. What happens during an annual well check? The services and screenings done by your health care provider during your annual well check will depend on your age, overall health, lifestyle risk factors, and family history of disease. Counseling  Your health care provider may ask you questions about your: Alcohol use. Tobacco use. Drug use. Emotional well-being. Home and  relationship well-being. Sexual activity. Eating habits. History of falls. Memory and ability to understand (cognition). Work and work [employer] Reproductive health. Screening  You may have the following tests or measurements: Height, weight, and BMI. Blood pressure. Lipid and cholesterol levels. These may be checked every 5 years, or more frequently if you are over 55years old. Skin check. Lung cancer screening. You may have this screening every year starting at age 4616if you have a 30-pack-year history of smoking and currently smoke or have quit within the past 15 years. Fecal occult blood test (FOBT) of the stool. You may have this test every year starting at age 86 Flexible sigmoidoscopy or colonoscopy. You may have a sigmoidoscopy every 5 years or a colonoscopy every 10 years starting at age 86 Hepatitis C blood test. Hepatitis B blood test. Sexually transmitted disease (STD) testing. Diabetes screening. This is done by checking your blood sugar (glucose) after you have not eaten for a while (fasting). You may have this done every 1-3 years. Bone density scan. This is done to screen for osteoporosis. You may have this done starting at age 86 Mammogram. This may be done every 1-2 years. Talk to your health care provider about how often you should have regular mammograms. Talk with your health care provider about your test results, treatment options, and if necessary, the need for more tests. Vaccines  Your health care provider may recommend certain vaccines, such as: Influenza vaccine. This is recommended every year. Tetanus, diphtheria, and acellular pertussis (Tdap, Td) vaccine. You may need a Td booster every 10 years. Zoster vaccine. You may need this after age 86 Pneumococcal 13-valent conjugate (PCV13) vaccine. One dose is recommended after age 86 Pneumococcal polysaccharide (  PPSV23) vaccine. One dose is recommended after age 28. Talk to your health care provider  about which screenings and vaccines you need and how often you need them. This information is not intended to replace advice given to you by your health care provider. Make sure you discuss any questions you have with your health care provider. Document Released: 04/14/2015 Document Revised: 12/06/2015 Document Reviewed: 01/17/2015 Elsevier Interactive Patient Education  2017 [place] Prevention in the Home Falls can cause injuries. They can happen to people of all ages. There are many things you can do to make your home safe and to help prevent falls. What can I do on the outside of my home? Regularly fix the edges of walkways and driveways and fix any cracks. Remove anything that might make you trip as you walk through a door, such as a raised step or threshold. Trim any bushes or trees on the path to your home. Use bright outdoor lighting. Clear any walking paths of anything that might make someone trip, such as rocks or tools. Regularly check to see if handrails are loose or broken. Make sure that both sides of any steps have handrails. Any raised decks and porches should have guardrails on the edges. Have any leaves, snow, or ice cleared regularly. Use sand or salt on walking paths during winter. Clean up any spills in your garage right away. This includes oil or grease spills. What can I do in the bathroom? Use night lights. Install grab bars by the toilet and in the tub and shower. Do not use towel bars as grab bars. Use non-skid mats or decals in the tub or shower. If you need to sit down in the shower, use a plastic, non-slip stool. Keep the floor dry. Clean up any water that spills on the floor as soon as it happens. Remove soap buildup in the tub or shower regularly. Attach bath mats securely with double-sided non-slip rug tape. Do not have throw rugs and other things on the floor that can make you trip. What can I do in the bedroom? Use night lights. Make sure  that you have a light by your bed that is easy to reach. Do not use any sheets or blankets that are too big for your bed. They should not hang down onto the floor. Have a firm chair that has side arms. You can use this for support while you get dressed. Do not have throw rugs and other things on the floor that can make you trip. What can I do in the kitchen? Clean up any spills right away. Avoid walking on wet floors. Keep items that you use a lot in easy-to-reach places. If you need to reach something above you, use a strong step stool that has a grab bar. Keep electrical cords out of the way. Do not use floor polish or wax that makes floors slippery. If you must use wax, use non-skid floor wax. Do not have throw rugs and other things on the floor that can make you trip. What can I do with my stairs? Do not leave any items on the stairs. Make sure that there are handrails on both sides of the stairs and use them. Fix handrails that are broken or loose. Make sure that handrails are as long as the stairways. Check any carpeting to make sure that it is firmly attached to the stairs. Fix any carpet that is loose or worn. Avoid having throw rugs at the top or  bottom of the stairs. If you do have throw rugs, attach them to the floor with carpet tape. Make sure that you have a light switch at the top of the stairs and the bottom of the stairs. If you do not have them, ask someone to add them for you. What else can I do to help prevent falls? Wear shoes that: Do not have high heels. Have rubber bottoms. Are comfortable and fit you well. Are closed at the toe. Do not wear sandals. If you use a stepladder: Make sure that it is fully opened. Do not climb a closed stepladder. Make sure that both sides of the stepladder are locked into place. Ask someone to hold it for you, if possible. Clearly mark and make sure that you can see: Any grab bars or handrails. First and last steps. Where the edge of  each step is. Use tools that help you move around (mobility aids) if they are needed. These include: Canes. Walkers. Scooters. Crutches. Turn on the lights when you go into a dark area. Replace any light bulbs as soon as they burn out. Set up your furniture so you have a clear path. Avoid moving your furniture around. If any of your floors are uneven, fix them. If there are any pets around you, be aware of where they are. Review your medicines with your doctor. Some medicines can make you feel dizzy. This can increase your chance of falling. Ask your doctor what other things that you can do to help prevent falls. This information is not intended to replace advice given to you by your health care provider. Make sure you discuss any questions you have with your health care provider. Document Released: 01/12/2009 Document Revised: 08/24/2015 Document Reviewed: 04/22/2014 Elsevier Interactive Patient Education  2017 [employer]  : N/A

## 2022-01-01 NOTE — Telephone Encounter (Signed)
[name], benbrook are scheduled for a virtual visit with your provider today.    Just as we do with appointments in the office, we must obtain your consent to participate.  Your consent will be active for this visit and any virtual visit you may have with one of our providers in the next 365 days.    If you have a MyChart account, I can also send a copy of this consent to you electronically.  All virtual visits are billed to your insurance company just like a traditional visit in the office.  As this is a virtual visit, video technology does not allow for your provider to perform a traditional examination.  This may limit your provider's ability to fully assess your condition.  If your provider identifies any concerns that need to be evaluated in person or the need to arrange testing such as labs, EKG, etc, we will make arrangements to do so.    Although advances in technology are sophisticated, we cannot ensure that it will always work on either your end or our end.  If the connection with a video visit is poor, we may have to switch to a telephone visit.  With either a video or telephone visit, we are not always able to ensure that we have a secure connection.   I need to obtain your verbal consent now.   Are you willing to proceed with your visit today?   Ashley Lawson has provided verbal consent on 01/01/2022 for a virtual visit (video or telephone).   [name], [place] 01/01/2022  11:29 AM

## 2022-01-01 NOTE — Progress Notes (Signed)
This service is provided via telemedicine  No vital signs collected/recorded due to the encounter was a telemedicine visit.   Location of patient (ex: home, work):  home  Patient consents to a telephone visit:  yes see telephone encounter dated 01/01/22  Location of the provider (ex: office, home):  [facility]   Name of any referring provider:    Names of all persons participating in the telemedicine service and their role in the encounter:  Brooke CMA  Time spent on call:  11 minutes     Subjective:   Ashley Lawson is a 88 y.o. female who presents for Medicare Annual (Subsequent) preventive examination.  Review of Systems     Cardiac Risk Factors include: advanced age (>55mn, >>79women);hypertension;obesity (BMI >30kg/m2)     Objective:    Today's Vitals   01/01/22 1253  Temp: (!) 97.3 F (36.3 C)  Weight: 160 lb (72.6 kg)  Height: 5' 2"  (1.575 m)   Body mass index is 29.26 kg/m.     11/16/2021    2:11 PM 09/03/2021    1:15 PM 08/08/2021    2:12 PM 08/03/2021    2:27 PM 05/10/2021    4:52 PM 03/29/2021    2:33 PM 03/13/2021    2:45 PM  Advanced Directives  Does Patient Have a Medical Advance Directive? Yes Yes Yes Yes Yes Yes Yes  Type of AParamedicof A[place] LakeLiving will HM[place]cal[place]iving will HK[place]Living will [place]South Z[place]villeLiving will HSudden ValleyLiving will HMendota HeightsLiving will HCrystal LakeLiving will  Does patient want to make changes to medical advance directive? No - Patient declined No - Patient declined No - Patient declined  No - Patient declined No - Patient declined No - Patient declined  Copy of [place] Chart? Yes - validated most recent copy scanned in chart (See row information) Yes - validated most recent copy scanned in chart (See row information) Yes - validated most  recent copy scanned in chart (See row information)  Yes - validated most recent copy scanned in chart (See row information) Yes - validated most recent copy scanned in chart (See row information) Yes - validated most recent copy scanned in chart (See row information)    Current Medications (verified) Outpatient Encounter Medications as of 01/01/2022  Medication Sig   acetaminophen (TYLENOL) 500 MG tablet Take 500 mg by mouth at bedtime as needed.   apixaban (ELIQUIS) 5 MG TABS tablet Take 1 tablet (5 mg total) by mouth 2 (two) times daily.   Ascorbic Acid (VITAMIN C) 100 MG tablet Take 500 mg by mouth daily.   benzonatate (TESSALON PERLES) 100 MG capsule Take 1 capsule (100 mg total) by mouth 3 (three) times daily as needed for cough.   calcium carbonate (OS-CAL) 1250 (500 Ca) MG chewable tablet Chew 1 tablet by mouth daily.    diltiazem (CARDIZEM CD) 120 MG 24 hr capsule TAKE 1 CAPSULE BY MOUTH EVERY DAY   escitalopram (LEXAPRO) 5 MG tablet TAKE 1 TABLET BY MOUTH EVERYDAY AT BEDTIME   fluticasone (FLONASE) 50 MCG/ACT nasal spray USE 2 SPRAYS IN NOSTRILS DAILY X1 WEEK THEN 1-2 SPRAYS DAILY. USE LOWEST POSSIBLE DOSE AFTER WEEK 1   guaiFENesin (MUCINEX) 600 MG 12 hr tablet Take 600 mg by mouth daily as needed.   loratadine (CLARITIN) 10 MG tablet TAKE 1 TABLET BY MOUTH EVERY DAY  Multiple Vitamin (MULTIVITAMIN) tablet Take 1 tablet by mouth daily.   polyethylene glycol powder (GLYCOLAX/MIRALAX) 17 GM/SCOOP powder Take 17 g by mouth daily. Hold for loose stool   PREMARIN vaginal cream INSERT 1 GRAM VAGINALLY THREE TIMES A WEEKLY, THEN ONCE WEEKLY   Probiotic Product (ALIGN) 4 MG CAPS Take 1 capsule by mouth as needed.   RESTASIS 0.05 % ophthalmic emulsion Place 1 drop into both eyes 2 (two) times daily.   No facility-administered encounter medications on file as of 01/01/2022.    Allergies (verified) Alendronate sodium, Penicillins, Amoxicillin, and Metronidazole   History: Past Medical  History:  Diagnosis Date   Abdominal pain 12/2009   hospitalized   Allergic rhinitis    Anxiety    Bladder problem    Chronic constipation    Compression fx, lumbar spine (HCC)    DDD (degenerative disc disease), lumbar    Diverticulosis of colon    Dog bite(E906.0) 08/30/2011   Dysrhythmia    GERD (gastroesophageal reflux disease)    Hepatic steatosis    pt denies   Hyperlipidemia    Hyperplastic colon polyp    IBS (irritable bowel syndrome)    constipation predominant   Internal hemorrhoids    Osteoporosis    dexa 2011 -2.7 hip nl spine   PAF (paroxysmal atrial fibrillation) ([place])    a. dx 05/2017.   PMR (polymyalgia rheumatica) ([place]) 08/30/2011   under rheum care  on low dose pred 5 mg    Renal disorder    Past Surgical History:  Procedure Laterality Date   ABDOMINAL HYSTERECTOMY     with bladder tact and rectocele repair   BLADDER SURGERY     Streched   CATARACT EXTRACTION  2010   both   COLONOSCOPY  2016 was most recent   CYSTO WITH HYDRODISTENSION N/A 10/05/2019   Procedure: CYSTOSCOPY/HYDRODISTENSION;  Surgeon: Bjorn Loser, MD;  Location: [facility];  Service: Urology;  Laterality: N/A;   TONSILLECTOMY AND ADENOIDECTOMY     Family History  Problem Relation Age of Onset   Stroke Mother 79   Liver disease Brother 58   Heart attack Father 61   Alzheimer's disease Sister 70   Bladder Cancer Sister 97   Crohn's disease Son    Colon cancer Neg Hx    Esophageal cancer Neg Hx    Stomach cancer Neg Hx    Rectal cancer Neg Hx    Liver cancer Neg Hx    Social History   Socioeconomic History   Marital status: Widowed    Spouse name: Not on file   Number of children: Not on file   Years of education: Not on file   Highest education level: Not on file  Occupational History   Occupation: retired  Tobacco Use   Smoking status: Never   Smokeless tobacco: Never  Vaping Use   Vaping Use: Never used  Substance and Sexual Activity   Alcohol use: Yes     Alcohol/week: 0.0 standard drinks of alcohol    Comment: occ   Drug use: Never   Sexual activity: Not Currently  Other Topics Concern   Not on file  Social History Narrative   Retired   Regular exercise- yes   Widowed   At home with children and GKs    HH of 2     Puppy   Plays cards is social and active       Diet:  No      Do you drink/ eat  things with caffeine? Yes      Marital status:   Widow                            What year were you married ? 1448,1856      Do you live in a house, apartment,assistred living, condo, trailer, etc.)? Townhouse      Is it one or more stories? 2      How many persons live in your home ? 2      Do you have any pets in your home ?(please list)  None      Highest Level of education completed:  12 th Grade      Current or past profession: Retail Sales @ [employer]      Do you exercise?    Some                          Type & how often  Walking      ADVANCED DIRECTIVES (Please bring copies)      Do you have a living will? Yes      Do you have a DNR form?  Yes                     If not, do you want to discuss one?       Do you have signed POA?HPOA forms? Yes                If so, please bring to your appointment      FUNCTIONAL STATUS- To be completed by Spouse / child / Staff       Do you have difficulty bathing or dressing yourself ? No      Do you have difficulty preparing food or eating ?  No      Do you have difficulty managing your mediation ? No      Do you have difficulty managing your finances ? No      Do you have difficulty affording your medication ? No      Social Determinants of Radio broadcast assistant Strain: Not on file  Food Insecurity: Not on file  Transportation Needs: Not on file  Physical Activity: Not on file  Stress: Not on file  Social Connections: Not on file    Tobacco Counseling Counseling given: Not Answered   Clinical Intake:  Pre-visit preparation completed: Yes  Pain : No/denies  pain     BMI - recorded: 29.45 Nutritional Status: BMI 25 -29 Overweight Nutritional Risks: None Diabetes: No  How often do you need to have someone help you when you read instructions, pamphlets, or other written materials from your doctor or pharmacy?: 4 - Often (daughter assist) What is the last grade level you completed in school?: 12 Grade  Diabetic?No   Interpreter Needed?: No      Activities of Daily Living    01/01/2022    1:21 PM  In your present state of health, do you have any difficulty performing the following activities:  Hearing? 0  Vision? 0  Difficulty concentrating or making decisions? 1  Comment Remembering  Walking or climbing stairs? 0  Dressing or bathing? 0  Doing errands, shopping? 0  Preparing Food and eating ? N  Using the Toilet? N  In the past six months, have you accidently leaked urine? N  Do you have problems with  loss of bowel control? N  Managing your Medications? N  Managing your Finances? N  Housekeeping or managing your Housekeeping? N  Comment daughter assist    Patient Care Team: Kalyn Hofstra, [name], NP as PCP - General (Family Medicine) Burnell Blanks, MD as PCP - Cardiology (Cardiology) Bjorn Loser, MD (Urology) Particia Nearing, MD (Dermatology) Luberta Mutter, MD (Ophthalmology) Almedia Balls, MD (Orthopedic Surgery) Ouida Sills  (Rheumatology) Jarome Matin, MD as Consulting Physician (Dermatology) Gatha Mayer, MD as Consulting Physician (Gastroenterology) Rozetta Nunnery, MD (Inactive) as Consulting Physician (Otolaryngology)  Indicate any recent Medical Services you may have received from other than [facility] providers in the past year (date may be approximate).     Assessment:   This is a routine wellness examination for Burnetta.  Hearing/Vision screen No results found.  Dietary issues and exercise activities discussed: Current Exercise Habits: Home exercise routine, Type of exercise: walking, Time  (Minutes): 15, Frequency (Times/Week): 2, Weekly Exercise (Minutes/Week): 30, Intensity: Moderate, Exercise limited by: None identified   Goals Addressed               This Visit's Progress     p (pt-stated)   On track     Would like to walk better        Depression Screen    01/01/2022   12:54 PM 08/08/2021    3:12 PM 12/13/2020    3:08 PM 03/06/2020    3:20 PM 11/29/2019    1:33 PM 04/07/2019    3:02 PM 04/08/2018    9:43 AM  PHQ 2/9 Scores  PHQ - 2 Score 0 0 0 0 0 0 0  Exception Documentation Other- indicate reason in comment box        Not completed for annual wellness          Fall Risk    01/01/2022   12:54 PM 11/16/2021    2:10 PM 09/03/2021    1:14 PM 08/08/2021    3:12 PM 05/10/2021    4:52 PM  [place] in the past year? 0 0 1 1 0  Comment   patient hit her head per last visit 08/08/2021    Number falls in past yr: 0 0 0 0 0  Injury with Fall? 0 0 1 1 0  Comment    Hit head   Risk for fall due to : No Fall Risks No Fall Risks History of fall(s) History of fall(s) No Fall Risks  Follow up Falls evaluation completed Falls evaluation completed Falls evaluation completed;Education provided;Falls prevention discussed Falls evaluation completed Falls evaluation completed    FALL RISK PREVENTION PERTAINING TO THE HOME:  Any stairs in or around the home? Yes  If so, are there any without handrails? No  Home free of loose throw rugs in walkways, pet beds, electrical cords, etc? No  Adequate lighting in your home to reduce risk of falls? Yes   ASSISTIVE DEVICES UTILIZED TO PREVENT FALLS:  Life alert? No  Use of a cane, walker or w/c? No  Grab bars in the bathroom? No  Shower chair or bench in shower? No  Elevated toilet seat or a handicapped toilet? No   TIMED UP AND GO:  Was the test performed? No .  Length of time to ambulate 10 feet: N/A  sec.   Gait slow and steady without use of assistive device  Cognitive Function:    01/10/2020    4:43 PM  MMSE  - Mini Mental  State Exam  Orientation to time 5  Orientation to Place 5  Registration 3  Attention/ Calculation 5  Recall 3  Language- name 2 objects 2  Language- repeat 1  Language- follow 3 step command 3  Language- read & follow direction 1  Write a sentence 1  Copy design 0  Copy design-comments no to clock  Total score 29        01/01/2022   12:56 PM 12/13/2020    3:12 PM  6CIT Screen  What Year? 0 points 0 points  What month? 0 points 0 points  What time? 0 points 0 points  Count back from 20 0 points 0 points  Months in reverse 0 points 0 points  Repeat phrase 0 points 2 points  Total Score 0 points 2 points    Immunizations Immunization History  Administered Date(s) Administered   Fluad Quad(high Dose 65+) 01/10/2020, 12/10/2021   Influenza Split 01/25/2011, 01/23/2012   Influenza Whole 12/31/2007   Influenza, High Dose Seasonal PF 01/19/2013, 01/10/2014, 01/17/2015, 01/18/2016, 01/02/2017, 01/05/2018, 01/05/2019, 01/05/2019, 02/07/2021   Influenza-Unspecified 01/02/2017   PFIZER(Purple Top)SARS-COV-2 Vaccination 05/07/2019, 06/02/2019, 01/31/2020, 08/02/2020   Pfizer Covid-19 Vaccine Bivalent Booster 5yr & up 02/19/2021   Pneumococcal Conjugate-13 02/23/2013   Pneumococcal Polysaccharide-23 04/01/2006, 04/01/2018   Td 04/01/2005   Td (Adult) 11/22/2016   Tdap 08/30/2011, 08/03/2021   Zoster Recombinat (Shingrix) 12/04/2016, 07/27/2017   Zoster, Live 04/02/2007    TDAP status: Up to date  Flu Vaccine status: Up to date  Pneumococcal vaccine status: Up to date  Covid-19 vaccine status: Information provided on how to obtain vaccines.   Qualifies for Shingles Vaccine? Yes   Zostavax completed Yes   Shingrix Completed?: Yes  Screening Tests Health Maintenance  Topic Date Due   COVID-19 Vaccine (6 - Pfizer risk series) 04/16/2021   MAMMOGRAM  09/09/2022   TETANUS/TDAP  08/04/2031   Pneumonia Vaccine 88 Years old  Completed   INFLUENZA VACCINE   Completed   DEXA SCAN  Completed   Zoster Vaccines- Shingrix  Completed   HPV VACCINES  Aged Out    Health Maintenance  Health Maintenance Due  Topic Date Due   COVID-19 Vaccine (6 - Pfizer risk series) 04/16/2021    Colorectal cancer screening: No longer required.   Mammogram status: No longer required due to advance age .  Bone Density status: Completed 04/21/2019. Results reflect: Bone density results: OSTEOPOROSIS. Repeat every 2 years.  Lung Cancer Screening: (Low Dose CT Chest recommended if Age 86-80years, 30 pack-year currently smoking OR have quit w/in 15years.) does not qualify.   Lung Cancer Screening Referral: No   Additional Screening:  Hepatitis C Screening: does not qualify; Completed No   Vision Screening: Recommended annual ophthalmology exams for early detection of glaucoma and other disorders of the eye. Is the patient up to date with their annual eye exam?  No Has upcoming appointment  Who is the provider or what is the name of the office in which the patient attends annual eye exams? Could not remember the eye Doctor's name  If pt is not established with a provider, would they like to be referred to a provider to establish care? No .   Dental Screening: Recommended annual dental exams for proper oral hygiene  Community Resource Referral / Chronic Care Management: CRR required this visit?  No   CCM required this visit?  No      Plan:     I have personally reviewed and noted  the following in the patient's chart:   Medical and social history Use of alcohol, tobacco or illicit drugs  Current medications and supplements including opioid prescriptions. Patient is not currently taking opioid prescriptions. Functional ability and status Nutritional status Physical activity Advanced directives List of other physicians Hospitalizations, surgeries, and ER visits in previous 12 months Vitals Screenings to include cognitive, depression, and  falls Referrals and appointments  In addition, I have reviewed and discussed with patient certain preventive protocols, quality metrics, and best practice recommendations. A written personalized care plan for preventive services as well as general preventive health recommendations were provided to patient.     Sandrea Hughs, NP   01/01/2022   Nurse Notes: Up to date except COVID-19 vaccine booster

## 2022-01-07 DIAGNOSIS — Z85828 Personal history of other malignant neoplasm of skin: Secondary | ICD-10-CM | POA: Diagnosis not present

## 2022-01-07 DIAGNOSIS — D2262 Melanocytic nevi of left upper limb, including shoulder: Secondary | ICD-10-CM | POA: Diagnosis not present

## 2022-01-07 DIAGNOSIS — D2271 Melanocytic nevi of right lower limb, including hip: Secondary | ICD-10-CM | POA: Diagnosis not present

## 2022-01-07 DIAGNOSIS — D2272 Melanocytic nevi of left lower limb, including hip: Secondary | ICD-10-CM | POA: Diagnosis not present

## 2022-01-07 DIAGNOSIS — L570 Actinic keratosis: Secondary | ICD-10-CM | POA: Diagnosis not present

## 2022-01-07 DIAGNOSIS — L821 Other seborrheic keratosis: Secondary | ICD-10-CM | POA: Diagnosis not present

## 2022-01-07 DIAGNOSIS — L814 Other melanin hyperpigmentation: Secondary | ICD-10-CM | POA: Diagnosis not present

## 2022-01-07 DIAGNOSIS — D2261 Melanocytic nevi of right upper limb, including shoulder: Secondary | ICD-10-CM | POA: Diagnosis not present

## 2022-01-07 DIAGNOSIS — I8392 Asymptomatic varicose veins of left lower extremity: Secondary | ICD-10-CM | POA: Diagnosis not present

## 2022-01-07 DIAGNOSIS — D225 Melanocytic nevi of trunk: Secondary | ICD-10-CM | POA: Diagnosis not present

## 2022-01-11 ENCOUNTER — Ambulatory Visit (INDEPENDENT_AMBULATORY_CARE_PROVIDER_SITE_OTHER): Payer: Medicare Other | Admitting: Adult Health

## 2022-01-11 ENCOUNTER — Encounter: Payer: Self-pay | Admitting: Adult Health

## 2022-01-11 VITALS — BP 110/70 | HR 59 | Temp 96.9°F | Resp 20 | Ht 62.0 in | Wt 161.0 lb

## 2022-01-11 DIAGNOSIS — H6690 Otitis media, unspecified, unspecified ear: Secondary | ICD-10-CM | POA: Diagnosis not present

## 2022-01-11 MED ORDER — DOXYCYCLINE HYCLATE 100 MG PO TABS: 100.0000 mg | ORAL_TABLET | Freq: Two times a day (BID) | ORAL | 0 refills | Status: AC

## 2022-01-11 NOTE — Progress Notes (Unsigned)
[facility] clinic  Provider:   Code Status: *** Goals of Care:     11/16/2021    2:11 PM  Advanced Directives  Does Patient Have a Medical Advance Directive? Yes  Type of Paramedic of [place];Living will  Does patient want to make changes to medical advance directive? No - Patient declined  Copy of [place] in Chart? Yes - validated most recent copy scanned in chart (See row information)     Chief Complaint  Patient presents with   Acute Visit    Patient is here for sinus issues and head/ear congestion    HPI: Patient is a 88 y.o. female seen today for an acute visit for  Past Medical History:  Diagnosis Date   Abdominal pain 12/2009   hospitalized   Allergic rhinitis    Anxiety    Bladder problem    Chronic constipation    Compression fx, lumbar spine ([place])    DDD (degenerative disc disease), lumbar    Diverticulosis of colon    Dog bite(E906.0) 08/30/2011   Dysrhythmia    GERD (gastroesophageal reflux disease)    Hepatic steatosis    pt denies   Hyperlipidemia    Hyperplastic colon polyp    IBS (irritable bowel syndrome)    constipation predominant   Internal hemorrhoids    Osteoporosis    dexa 2011 -2.7 hip nl spine   PAF (paroxysmal atrial fibrillation) ([place])    a. dx 05/2017.   PMR (polymyalgia rheumatica) ([place]) 08/30/2011   under rheum care  on low dose pred 5 mg    Renal disorder     Past Surgical History:  Procedure Laterality Date   ABDOMINAL HYSTERECTOMY     with bladder tact and rectocele repair   BLADDER SURGERY     Streched   CATARACT EXTRACTION  2010   both   COLONOSCOPY  2016 was most recent   CYSTO WITH HYDRODISTENSION N/A 10/05/2019   Procedure: CYSTOSCOPY/HYDRODISTENSION;  Surgeon: Bjorn Loser, MD;  Location: [facility];  Service: Urology;  Laterality: N/A;   TONSILLECTOMY AND ADENOIDECTOMY      Allergies  Allergen Reactions   Alendronate Sodium     upset stomach   Penicillins     Has  patient had a PCN reaction causing immediate rash, facial/tongue/throat swelling, SOB or lightheadedness with hypotension: Yes Has patient had a PCN reaction causing severe rash involving mucus membranes or skin necrosis: No Has patient had a PCN reaction that required hospitalization: No Has patient had a PCN reaction occurring within the last 10 years: No If all of the above answers are "NO", then may proceed with Cephalosporin use.    Amoxicillin Rash    Has patient had a PCN reaction causing immediate rash, facial/tongue/throat swelling, SOB or lightheadedness with hypotension: yes Has patient had a PCN reaction causing severe rash involving mucus membranes or skin necrosis: no Has patient had a PCN reaction that required hospitalization: no Has patient had PCN reaction within the last 10 years: no  If all of the above answers are "NO", then may proceed with Cephalosporin use.  REACTION: unspecified   Metronidazole Other (See Comments)    Pounding headaches patient says was bad.  With cipro for diverticulitis rx. Other reaction(s): Other (See Comments) Pounding headaches patient says was bad.  With cipro for diverticulitis rx.    Outpatient Encounter Medications as of 01/11/2022  Medication Sig   acetaminophen (TYLENOL) 500 MG tablet Take 500 mg by  mouth at bedtime as needed.   apixaban (ELIQUIS) 5 MG TABS tablet Take 1 tablet (5 mg total) by mouth 2 (two) times daily.   Ascorbic Acid (VITAMIN C) 100 MG tablet Take 500 mg by mouth daily.   benzonatate (TESSALON PERLES) 100 MG capsule Take 1 capsule (100 mg total) by mouth 3 (three) times daily as needed for cough.   calcium carbonate (OS-CAL) 1250 (500 Ca) MG chewable tablet Chew 1 tablet by mouth daily.    diltiazem (CARDIZEM CD) 120 MG 24 hr capsule TAKE 1 CAPSULE BY MOUTH EVERY DAY   escitalopram (LEXAPRO) 5 MG tablet TAKE 1 TABLET BY MOUTH EVERYDAY AT BEDTIME   fluticasone (FLONASE) 50 MCG/ACT nasal spray USE 2 SPRAYS IN NOSTRILS  DAILY X1 WEEK THEN 1-2 SPRAYS DAILY. USE LOWEST POSSIBLE DOSE AFTER WEEK 1   guaiFENesin (MUCINEX) 600 MG 12 hr tablet Take 600 mg by mouth daily as needed.   loratadine (CLARITIN) 10 MG tablet TAKE 1 TABLET BY MOUTH EVERY DAY   Multiple Vitamin (MULTIVITAMIN) tablet Take 1 tablet by mouth daily.   polyethylene glycol powder (GLYCOLAX/MIRALAX) 17 GM/SCOOP powder Take 17 g by mouth daily. Hold for loose stool   PREMARIN vaginal cream INSERT 1 GRAM VAGINALLY THREE TIMES A WEEKLY, THEN ONCE WEEKLY   Probiotic Product (ALIGN) 4 MG CAPS Take 1 capsule by mouth as needed.   RESTASIS 0.05 % ophthalmic emulsion Place 1 drop into both eyes 2 (two) times daily.   No facility-administered encounter medications on file as of 01/11/2022.    Review of Systems:  Review of Systems  Health Maintenance  Topic Date Due   COVID-19 Vaccine (6 - Pfizer risk series) 04/16/2021   MAMMOGRAM  09/09/2022   TETANUS/TDAP  08/04/2031   Pneumonia Vaccine 19+ Years old  Completed   INFLUENZA VACCINE  Completed   DEXA SCAN  Completed   Zoster Vaccines- Shingrix  Completed   HPV VACCINES  Aged Out    Physical Exam: Vitals:   01/11/22 1455  BP: 110/70  Pulse: (!) 59  Resp: 20  Temp: (!) 96.9 F (36.1 C)  SpO2: 97%  Weight: 161 lb (73 kg)  Height: 5' 2"  (1.575 m)   Body mass index is 29.45 kg/m. Physical Exam  Labs reviewed: Basic Metabolic Panel: Recent Labs    05/07/21 0921 11/09/21 0955  NA 136 139  K 3.8 5.2  CL 101 103  CO2 25 29  GLUCOSE 86 86  BUN 6* 9  CREATININE 0.80 0.95  CALCIUM 9.4 9.5  TSH 5.50* 5.04*   Liver Function Tests: Recent Labs    05/07/21 0921 11/09/21 0955  AST 20 20  ALT 15 14  BILITOT 1.1 0.9  PROT 6.9 6.7   No results for input(s): "LIPASE", "AMYLASE" in the last 8760 hours. No results for input(s): "AMMONIA" in the last 8760 hours. CBC: Recent Labs    05/07/21 0921 08/08/21 1612 11/09/21 0955  WBC 5.3 9.5 5.6  NEUTROABS 3,169 6,346 3,220  HGB  13.7 12.9 13.1  HCT 41.2 39.6 38.6  MCV 94.9 94.3 93.7  PLT 248 281 230   Lipid Panel: Recent Labs    05/07/21 0921 11/09/21 0955  CHOL 200* 168  HDL 66 59  LDLCALC 112* 86  TRIG 111 129  CHOLHDL 3.0 2.8   No results found for: "HGBA1C"  Procedures since last visit: No results found.  Assessment/Plan There are no diagnoses linked to this encounter.   Labs/tests ordered:  * No order  type specified * Next appt:  05/20/2022

## 2022-01-11 NOTE — Patient Instructions (Signed)

## 2022-01-24 ENCOUNTER — Telehealth: Payer: Self-pay

## 2022-01-24 ENCOUNTER — Other Ambulatory Visit: Payer: Self-pay | Admitting: Adult Health

## 2022-01-24 MED ORDER — DOXYCYCLINE HYCLATE 100 MG PO TABS: 100.0000 mg | ORAL_TABLET | Freq: Two times a day (BID) | ORAL | 0 refills | Status: AC

## 2022-01-24 NOTE — Telephone Encounter (Signed)
Patient called stating that the drainage from her is is back and she would like to know if she can get a refill on her antibiotic.   Message routed to Durenda Age, NP

## 2022-02-04 ENCOUNTER — Ambulatory Visit (INDEPENDENT_AMBULATORY_CARE_PROVIDER_SITE_OTHER): Payer: Medicare Other | Admitting: Family

## 2022-02-04 ENCOUNTER — Encounter: Payer: Self-pay | Admitting: Family

## 2022-02-04 VITALS — BP 120/78 | HR 63 | Temp 97.0°F | Resp 16 | Ht 62.0 in | Wt 162.0 lb

## 2022-02-04 DIAGNOSIS — H9203 Otalgia, bilateral: Secondary | ICD-10-CM | POA: Diagnosis not present

## 2022-02-04 DIAGNOSIS — R0982 Postnasal drip: Secondary | ICD-10-CM

## 2022-02-04 MED ORDER — LORATADINE 10 MG PO TABS: 10.0000 mg | ORAL_TABLET | Freq: Every day | ORAL | 5 refills | Status: DC

## 2022-02-04 NOTE — Patient Instructions (Signed)
-   Take Extra strength tylenol 500 mg one by mouth every 8 hrs   - Loratadine 10 mg tablet one by mouth daily

## 2022-02-04 NOTE — Progress Notes (Signed)
Provider: Marlowe Sax FNP-C  Tighe Gitto, [name], NP  Patient Care Team: Krisandra Bueno, [name], NP as PCP - General (Family Medicine) Burnell Blanks, MD as PCP - Cardiology (Cardiology) Bjorn Loser, MD (Urology) Particia Nearing, MD (Dermatology) Luberta Mutter, MD (Ophthalmology) Almedia Balls, MD (Orthopedic Surgery) Ouida Sills  (Rheumatology) Jarome Matin, MD as Consulting Physician (Dermatology) Gatha Mayer, MD as Consulting Physician (Gastroenterology) Rozetta Nunnery, MD (Inactive) as Consulting Physician (Otolaryngology)  Extended Emergency Contact Information Primary Emergency Contact: Apple,Karen Address: [address]           [address] [name] Phone: [phone] Work Phone: [phone] Mobile Phone: [phone] Relation: Daughter  Code Status:  Full Code  Goals of care: Advanced Directive information    11/16/2021    2:11 PM  Advanced Directives  Does Patient Have a Medical Advance Directive? Yes  Type of Paramedic of [place];Living will  Does patient want to make changes to medical advance directive? No - Patient declined  Copy of [place] in Chart? Yes - validated most recent copy scanned in chart (See row information)     Chief Complaint  Patient presents with   Acute Visit    Patient is here for bilateral ear congestion. Pain finished antibiotics been several weeks     HPI:  Pt is a 88 y.o. female seen today for an acute visit for evaluation of bilateral ear congestion and pain. States feels like she has fluid in the ears.Pain makes her feel dizzy.she was seen here on 01/11/2022 by Genia Plants NP was treated for acute Otitis Media prescribed doxycycline for 7 days.states completed antibiotic. She continues to complain of throat and ear pain.Daughter present here with her states was also advised to use some ear drops for pain but did not help. Patient  worried that the daughter will be out of town whether she will need antibiotics.Has been coughing and takes mucinex though daughter had to keep it away since has been taking despite not having congestion.states wakes up in the morning with drainage in the throat.  She denies any fever or chills.    Past Medical History:  Diagnosis Date   Abdominal pain 12/2009   hospitalized   Allergic rhinitis    Anxiety    Bladder problem    Chronic constipation    Compression fx, lumbar spine (HCC)    DDD (degenerative disc disease), lumbar    Diverticulosis of colon    Dog bite(E906.0) 08/30/2011   Dysrhythmia    GERD (gastroesophageal reflux disease)    Hepatic steatosis    pt denies   Hyperlipidemia    Hyperplastic colon polyp    IBS (irritable bowel syndrome)    constipation predominant   Internal hemorrhoids    Osteoporosis    dexa 2011 -2.7 hip nl spine   PAF (paroxysmal atrial fibrillation) ([place])    a. dx 05/2017.   PMR (polymyalgia rheumatica) ([place]) 08/30/2011   under rheum care  on low dose pred 5 mg    Renal disorder    Past Surgical History:  Procedure Laterality Date   ABDOMINAL HYSTERECTOMY     with bladder tact and rectocele repair   BLADDER SURGERY     Streched   CATARACT EXTRACTION  2010   both   COLONOSCOPY  2016 was most recent   CYSTO WITH HYDRODISTENSION N/A 10/05/2019   Procedure: CYSTOSCOPY/HYDRODISTENSION;  Surgeon: Bjorn Loser, MD;  Location: [facility];  Service: Urology;  Laterality: N/A;   TONSILLECTOMY AND ADENOIDECTOMY      Allergies  Allergen Reactions   Alendronate Sodium     upset stomach   Penicillins     Has patient had a PCN reaction causing immediate rash, facial/tongue/throat swelling, SOB or lightheadedness with hypotension: Yes Has patient had a PCN reaction causing severe rash involving mucus membranes or skin necrosis: No Has patient had a PCN reaction that required hospitalization: No Has patient had a PCN reaction occurring within the  last 10 years: No If all of the above answers are "NO", then may proceed with Cephalosporin use.    Amoxicillin Rash    Has patient had a PCN reaction causing immediate rash, facial/tongue/throat swelling, SOB or lightheadedness with hypotension: yes Has patient had a PCN reaction causing severe rash involving mucus membranes or skin necrosis: no Has patient had a PCN reaction that required hospitalization: no Has patient had PCN reaction within the last 10 years: no  If all of the above answers are "NO", then may proceed with Cephalosporin use.  REACTION: unspecified   Metronidazole Other (See Comments)    Pounding headaches patient says was bad.  With cipro for diverticulitis rx. Other reaction(s): Other (See Comments) Pounding headaches patient says was bad.  With cipro for diverticulitis rx.    Outpatient Encounter Medications as of 02/04/2022  Medication Sig   acetaminophen (TYLENOL) 500 MG tablet Take 500 mg by mouth at bedtime as needed.   apixaban (ELIQUIS) 5 MG TABS tablet Take 1 tablet (5 mg total) by mouth 2 (two) times daily.   Ascorbic Acid (VITAMIN C) 100 MG tablet Take 500 mg by mouth daily.   benzonatate (TESSALON PERLES) 100 MG capsule Take 1 capsule (100 mg total) by mouth 3 (three) times daily as needed for cough.   calcium carbonate (OS-CAL) 1250 (500 Ca) MG chewable tablet Chew 1 tablet by mouth daily.    diltiazem (CARDIZEM CD) 120 MG 24 hr capsule TAKE 1 CAPSULE BY MOUTH EVERY DAY   escitalopram (LEXAPRO) 5 MG tablet TAKE 1 TABLET BY MOUTH EVERYDAY AT BEDTIME   fluticasone (FLONASE) 50 MCG/ACT nasal spray USE 2 SPRAYS IN NOSTRILS DAILY X1 WEEK THEN 1-2 SPRAYS DAILY. USE LOWEST POSSIBLE DOSE AFTER WEEK 1   guaiFENesin (MUCINEX) 600 MG 12 hr tablet Take 600 mg by mouth daily as needed.   loratadine (CLARITIN) 10 MG tablet TAKE 1 TABLET BY MOUTH EVERY DAY   Multiple Vitamin (MULTIVITAMIN) tablet Take 1 tablet by mouth daily.   polyethylene glycol powder  (GLYCOLAX/MIRALAX) 17 GM/SCOOP powder Take 17 g by mouth daily. Hold for loose stool   PREMARIN vaginal cream INSERT 1 GRAM VAGINALLY THREE TIMES A WEEKLY, THEN ONCE WEEKLY   Probiotic Product (ALIGN) 4 MG CAPS Take 1 capsule by mouth as needed.   RESTASIS 0.05 % ophthalmic emulsion Place 1 drop into both eyes 2 (two) times daily.   No facility-administered encounter medications on file as of 02/04/2022.    Review of Systems  HENT:  Positive for congestion, ear pain, postnasal drip, rhinorrhea and sore throat.     Immunization History  Administered Date(s) Administered   Fluad Quad(high Dose 65+) 01/10/2020, 12/10/2021   Influenza Split 01/25/2011, 01/23/2012   Influenza Whole 12/31/2007   Influenza, High Dose Seasonal PF 01/19/2013, 01/10/2014, 01/17/2015, 01/18/2016, 01/02/2017, 01/05/2018, 01/05/2019, 01/05/2019, 02/07/2021   Influenza-Unspecified 01/02/2017   PFIZER(Purple Top)SARS-COV-2 Vaccination 05/07/2019, 06/02/2019, 01/31/2020, 08/02/2020   Pfizer Covid-19 Vaccine Bivalent Booster 33yr & up 02/19/2021   Pneumococcal  Conjugate-13 02/23/2013   Pneumococcal Polysaccharide-23 04/01/2006, 04/01/2018   Td 04/01/2005   Td (Adult) 11/22/2016   Tdap 08/30/2011, 08/03/2021   Zoster Recombinat ([facility]rix) 12/04/2016, 07/27/2017   Zoster, Live 04/02/2007   Pertinent  Health Maintenance Due  Topic Date Due   MAMMOGRAM  09/09/2022   INFLUENZA VACCINE  Completed   DEXA SCAN  Completed      08/03/2021    2:27 PM 08/08/2021    3:12 PM 09/03/2021    1:14 PM 11/16/2021    2:10 PM 01/01/2022   12:54 PM  [place] in the past year?  1 1 0 0  Was there an injury with Fall?  1 1 0 0  Was there an injury with Fall? - Comments  Hit head     Fall Risk Category Calculator  2 2 0 0  Fall Risk Category  Moderate Moderate Low Low  Patient Fall Risk Level Low fall risk Moderate fall risk Moderate fall risk Low fall risk Low fall risk  Patient at Risk for Falls Due to  History of fall(s)  History of fall(s) No Fall Risks No Fall Risks  Fall risk Follow up  Falls evaluation completed Falls evaluation completed;Education provided;Falls prevention discussed Falls evaluation completed Falls evaluation completed   Functional Status Survey:    Vitals:   02/04/22 1442  BP: 120/78  Pulse: 63  Resp: 16  Temp: (!) 97 F (36.1 C)  SpO2: 97%  Weight: 162 lb (73.5 kg)  Height: 5' 2"  (1.575 m)   Body mass index is 29.63 kg/m. Physical Exam Vitals reviewed.  Constitutional:      General: She is not in acute distress.    Appearance: Normal appearance. She is overweight. She is not ill-appearing or diaphoretic.  HENT:     Head: Normocephalic.     Right Ear: Tympanic membrane, ear canal and external ear normal. There is no impacted cerumen.     Left Ear: Tympanic membrane, ear canal and external ear normal. There is no impacted cerumen.     Nose: Nose normal. No congestion or rhinorrhea.     Mouth/Throat:     Mouth: Mucous membranes are moist.     Pharynx: Oropharynx is clear. No oropharyngeal exudate or posterior oropharyngeal erythema.  Eyes:     General: No scleral icterus.       Right eye: No discharge.        Left eye: No discharge.     Extraocular Movements: Extraocular movements intact.     Conjunctiva/sclera: Conjunctivae normal.     Pupils: Pupils are equal, round, and reactive to light.  Neck:     Vascular: No carotid bruit.  Cardiovascular:     Rate and Rhythm: Normal rate and regular rhythm.     Pulses: Normal pulses.     Heart sounds: Normal heart sounds. No murmur heard.    No friction rub. No gallop.  Pulmonary:     Effort: Pulmonary effort is normal. No respiratory distress.     Breath sounds: Normal breath sounds. No wheezing, rhonchi or rales.  Chest:     Chest wall: No tenderness.  Abdominal:     General: Bowel sounds are normal. There is no distension.     Palpations: Abdomen is soft. There is no mass.     Tenderness: There is no abdominal  tenderness. There is no right CVA tenderness, left CVA tenderness, guarding or rebound.  Musculoskeletal:        General: No  swelling or tenderness. Normal range of motion.     Cervical back: Normal range of motion. No rigidity or tenderness.     Right lower leg: No edema.     Left lower leg: No edema.  Lymphadenopathy:     Cervical: No cervical adenopathy.  Skin:    General: Skin is warm and dry.     Coloration: Skin is not pale.     Findings: No bruising, erythema, lesion or rash.  Neurological:     Mental Status: She is alert and oriented to person, place, and time.     Cranial Nerves: No cranial nerve deficit.     Sensory: No sensory deficit.     Motor: No weakness.     Coordination: Coordination normal.     Gait: Gait normal.  Psychiatric:        Mood and Affect: Mood normal.        Speech: Speech normal.        Behavior: Behavior normal.     Labs reviewed: Recent Labs    05/07/21 0921 11/09/21 0955  NA 136 139  K 3.8 5.2  CL 101 103  CO2 25 29  GLUCOSE 86 86  BUN 6* 9  CREATININE 0.80 0.95  CALCIUM 9.4 9.5   Recent Labs    05/07/21 0921 11/09/21 0955  AST 20 20  ALT 15 14  BILITOT 1.1 0.9  PROT 6.9 6.7   Recent Labs    05/07/21 0921 08/08/21 1612 11/09/21 0955  WBC 5.3 9.5 5.6  NEUTROABS 3,169 6,346 3,220  HGB 13.7 12.9 13.1  HCT 41.2 39.6 38.6  MCV 94.9 94.3 93.7  PLT 248 281 230   Lab Results  Component Value Date   TSH 5.04 (H) 11/09/2021   No results found for: "HGBA1C" Lab Results  Component Value Date   CHOL 168 11/09/2021   HDL 59 11/09/2021   LDLCALC 86 11/09/2021   LDLDIRECT 100.0 04/07/2019   TRIG 129 11/09/2021   CHOLHDL 2.8 11/09/2021    Significant Diagnostic Results in last 30 days:  No results found.  Assessment/Plan  1. PND (post-nasal drip) Has not used antihistamine  Advised to take loratadine  - loratadine (CLARITIN) 10 MG tablet; Take 1 tablet (10 mg total) by mouth daily.  Dispense: 30 tablet; Refill:  5  2. Ear pain, bilateral S/p antibiotics TM clear  Antihistamine as above  Refer to ENT if symptoms persist  Family/ staff Communication: Reviewed plan of care with patient verbalized understanding   Labs/tests ordered: None   Next Appointment: Return if symptoms worsen or fail to improve.   Sandrea Hughs, NP

## 2022-02-11 ENCOUNTER — Other Ambulatory Visit: Payer: Self-pay | Admitting: Family

## 2022-02-11 DIAGNOSIS — I739 Peripheral vascular disease, unspecified: Secondary | ICD-10-CM | POA: Diagnosis not present

## 2022-02-11 DIAGNOSIS — J019 Acute sinusitis, unspecified: Secondary | ICD-10-CM

## 2022-02-14 DIAGNOSIS — H6501 Acute serous otitis media, right ear: Secondary | ICD-10-CM | POA: Diagnosis not present

## 2022-03-06 DIAGNOSIS — H903 Sensorineural hearing loss, bilateral: Secondary | ICD-10-CM | POA: Diagnosis not present

## 2022-03-06 DIAGNOSIS — J342 Deviated nasal septum: Secondary | ICD-10-CM | POA: Diagnosis not present

## 2022-03-06 DIAGNOSIS — H9203 Otalgia, bilateral: Secondary | ICD-10-CM | POA: Diagnosis not present

## 2022-03-06 DIAGNOSIS — J310 Chronic rhinitis: Secondary | ICD-10-CM | POA: Diagnosis not present

## 2022-03-06 DIAGNOSIS — J343 Hypertrophy of nasal turbinates: Secondary | ICD-10-CM | POA: Diagnosis not present

## 2022-03-08 ENCOUNTER — Other Ambulatory Visit: Payer: Self-pay | Admitting: Family

## 2022-03-08 DIAGNOSIS — J019 Acute sinusitis, unspecified: Secondary | ICD-10-CM

## 2022-03-14 ENCOUNTER — Other Ambulatory Visit: Payer: Self-pay | Admitting: Family

## 2022-03-14 DIAGNOSIS — F419 Anxiety disorder, unspecified: Secondary | ICD-10-CM

## 2022-03-22 ENCOUNTER — Encounter: Payer: Self-pay | Admitting: Family

## 2022-03-22 ENCOUNTER — Ambulatory Visit (INDEPENDENT_AMBULATORY_CARE_PROVIDER_SITE_OTHER): Payer: Medicare Other | Admitting: Family

## 2022-03-22 ENCOUNTER — Other Ambulatory Visit: Payer: Self-pay

## 2022-03-22 VITALS — BP 128/70 | HR 80 | Temp 96.9°F | Resp 18 | Ht 62.0 in | Wt 162.4 lb

## 2022-03-22 DIAGNOSIS — R252 Cramp and spasm: Secondary | ICD-10-CM

## 2022-03-22 DIAGNOSIS — M5431 Sciatica, right side: Secondary | ICD-10-CM

## 2022-03-22 DIAGNOSIS — R1031 Right lower quadrant pain: Secondary | ICD-10-CM

## 2022-03-22 MED ORDER — PREDNISONE 20 MG PO TABS: ORAL_TABLET | ORAL | 0 refills | Status: AC

## 2022-03-22 MED ORDER — METHOCARBAMOL 500 MG PO TABS: 500.0000 mg | ORAL_TABLET | Freq: Three times a day (TID) | ORAL | 1 refills | Status: DC | PRN

## 2022-03-22 NOTE — Patient Instructions (Signed)
Sciatica  Sciatica is pain, numbness, weakness, or tingling along the path of the sciatic nerve. The sciatic nerve starts in the lower back and runs down the back of each leg. The nerve controls the muscles in the lower leg and in the back of the knee. It also provides feeling (sensation) to the back of the thigh, the lower leg, and the sole of the foot. Sciatica is a symptom of another medical condition that pinches or puts pressure on the sciatic nerve. Sciatica most often only affects one side of the body. Sciatica usually goes away on its own or with treatment. In some cases, sciatica may come back (recur). What are the causes? This condition is caused by pressure on the sciatic nerve or pinching of the nerve. This may be the result of: A disk in between the bones of the spine bulging out too far (herniated disk). Age-related changes in the spinal disks. A pain disorder that affects a muscle in the buttock. Extra bone growth near the sciatic nerve. A break (fracture) of the pelvis. Pregnancy. Tumor. This is rare. What increases the risk? The following factors may make you more likely to develop this condition: Playing sports that place pressure or stress on the spine. Having poor strength and flexibility. A history of back injury or surgery. Sitting for long periods of time. Doing activities that involve repetitive bending or lifting. Obesity. What are the signs or symptoms? Symptoms can vary from mild to very severe. They may include: Any of the following problems in the lower back, leg, hip, or buttock: Mild tingling, numbness, or dull aches. Burning sensations. Sharp pains. Numbness in the back of the calf or the sole of the foot. Leg weakness. Severe back pain that makes movement difficult. Symptoms may get worse when you cough, sneeze, or laugh, or when you sit or stand for long periods of time. How is this diagnosed? This condition may be diagnosed based on: Your symptoms  and medical history. A physical exam. Blood tests. Imaging tests, such as: X-rays. An MRI. A CT scan. How is this treated? In many cases, this condition improves on its own without treatment. However, treatment may include: Reducing or modifying physical activity. Exercising, including strengthening and stretching. Icing and applying heat to the affected area. Medicines that help to: Relieve pain and swelling. Relax your muscles. Injections of medicines that help to relieve pain and inflammation (steroids) around the sciatic nerve. Surgery. Follow these instructions at home: Medicines Take over-the-counter and prescription medicines only as told by your health care provider. Ask your health care provider if the medicine prescribed to you requires you to avoid driving or using heavy machinery. Managing pain     If directed, put ice on the affected area. To do this: Put ice in a plastic bag. Place a towel between your skin and the bag. Leave the ice on for 20 minutes, 2-3 times a day. If your skin turns bright red, remove the ice right away to prevent skin damage. The risk of skin damage is higher if you cannot feel pain, heat, or cold. If directed, apply heat to the affected area as often as told by your health care provider. Use the heat source that your health care provider recommends, such as a moist heat pack or a heating pad. Place a towel between your skin and the heat source. Leave the heat on for 20-30 minutes. If your skin turns bright red, remove the heat right away to prevent burns. The  risk of burns is higher if you cannot feel pain, heat, or cold. Activity  Return to your normal activities as told by your health care provider. Ask your health care provider what activities are safe for you. Avoid activities that make your symptoms worse. Take brief periods of rest throughout the day. When you rest for longer periods, mix in some mild activity or stretching between  periods of rest. This will help to prevent stiffness and pain. Avoid sitting for long periods of time without moving. Get up and move around at least one time each hour. Exercise and stretch regularly as told by your health care provider. Do not lift anything that is heavier than 10 lb (4.5 kg) until your health care provider says that it is safe. When you do not have symptoms, you should still avoid heavy lifting, especially repetitive heavy lifting. When you lift objects, always use proper lifting technique, which includes: Bending your knees. Keeping the load close to your body. Avoiding twisting. General instructions Maintain a healthy weight. Excess weight puts extra stress on your back. Wear supportive, comfortable shoes. Avoid wearing high heels. Avoid sleeping on a mattress that is too soft or too hard. A mattress that is firm enough to support your back when you sleep may help to reduce your pain. Contact a health care provider if: Your pain is not controlled by medicine. Your pain does not improve or gets worse. Your pain lasts longer than 4 weeks. You have unexplained weight loss. Get help right away if: You are not able to control when you urinate or have bowel movements (incontinence). You have: Weakness in your lower back, pelvis, buttocks, or legs that gets worse. Redness or swelling of your back. A burning sensation when you urinate. Summary Sciatica is pain, numbness, weakness, or tingling along the path of the sciatic nerve, which may include the lower back, legs, hips, and buttocks. This condition is caused by pressure on the sciatic nerve or pinching of the nerve. Treatment often includes rest, exercise, medicines, and applying ice or heat. This information is not intended to replace advice given to you by your health care provider. Make sure you discuss any questions you have with your health care provider. Document Revised: 06/25/2021 Document Reviewed:  06/25/2021 Elsevier Patient Education  [place].

## 2022-03-22 NOTE — Progress Notes (Signed)
 Provider:   FNP-C  ,  C, NP  Patient Care Team: ,  C, NP as PCP - General (Family Medicine) McAlhany, Christopher D, MD as PCP - Cardiology (Cardiology) MacDiarmid, Scott, MD (Urology) Turner, William, MD (Dermatology) McCuen, Christine, MD (Ophthalmology) Voytek, Anna, MD (Orthopedic Surgery) anderson  (Rheumatology) Jones, Drew, MD as Consulting Physician (Dermatology) Gessner, Carl E, MD as Consulting Physician (Gastroenterology) Newman, Christopher E, MD (Inactive) as Consulting Physician (Otolaryngology)  Extended Emergency Contact Information Primary Emergency Contact: Apple,Karen Address: [address]          [place] [address] [place] Home Phone: [phone] Work Phone: [phone] Mobile Phone: [phone] Relation: Daughter  Code Status:  Full Code  Goals of care: Advanced Directive information    11/16/2021    2:11 PM  Advanced Directives  Does Patient Have a Medical Advance Directive? Yes  Type of Advance Directive Healthcare Power of Attorney;Living will  Does patient want to make changes to medical advance directive? No - Patient declined  Copy of Healthcare Power of Attorney in Chart? Yes - validated most recent copy scanned in chart (See row information)     Chief Complaint  Patient presents with  . Acute Visit    Patient complains of bad hip and back.     HPI:  Pt is a 88 y.o. female seen today for an acute visit for evaluation of left hip pain x 2 days.pain constant unable to sit on left left side.No numbness,tingling or weakness on the leg. Denies any fall or injury to hip.  Also complains of right lower abdomen pain.had some nausea this morning but no vomiting.Daughter has noticed worsening memory loss.  Has had leg cramps.    Past Medical History:  Diagnosis Date  . Abdominal pain 12/2009   hospitalized  . Allergic rhinitis   . Anxiety   . Bladder problem   . Chronic  constipation   . Compression fx, lumbar spine (HCC)   . DDD (degenerative disc disease), lumbar   . Diverticulosis of colon   . Dog bite(E906.0) 08/30/2011  . Dysrhythmia   . GERD (gastroesophageal reflux disease)   . Hepatic steatosis    pt denies  . Hyperlipidemia   . Hyperplastic colon polyp   . IBS (irritable bowel syndrome)    constipation predominant  . Internal hemorrhoids   . Osteoporosis    dexa 2011 -2.7 hip nl spine  . PAF (paroxysmal atrial fibrillation) (HCC)    a. dx 05/2017.  . PMR (polymyalgia rheumatica) (HCC) 08/30/2011   under rheum care  on low dose pred 5 mg   . Renal disorder    Past Surgical History:  Procedure Laterality Date  . ABDOMINAL HYSTERECTOMY     with bladder tact and rectocele repair  . BLADDER SURGERY     Streched  . CATARACT EXTRACTION  2010   both  . COLONOSCOPY  2016 was most recent  . CYSTO WITH HYDRODISTENSION N/A 10/05/2019   Procedure: CYSTOSCOPY/HYDRODISTENSION;  Surgeon: MacDiarmid, Scott, MD;  Location: [facility];  Service: Urology;  Laterality: N/A;  . TONSILLECTOMY AND ADENOIDECTOMY      Allergies  Allergen Reactions  . Alendronate Sodium     upset stomach  . Penicillins     Has patient had a PCN reaction causing immediate rash, facial/tongue/throat swelling, SOB or lightheadedness with hypotension: Yes Has patient had a PCN reaction causing severe rash involving mucus membranes or skin necrosis: No Has patient had a PCN reaction that required   hospitalization: No Has patient had a PCN reaction occurring within the last 10 years: No If all of the above answers are "NO", then may proceed with Cephalosporin use.   . Amoxicillin Rash    Has patient had a PCN reaction causing immediate rash, facial/tongue/throat swelling, SOB or lightheadedness with hypotension: yes Has patient had a PCN reaction causing severe rash involving mucus membranes or skin necrosis: no Has patient had a PCN reaction that required hospitalization: no Has  patient had PCN reaction within the last 10 years: no  If all of the above answers are "NO", then may proceed with Cephalosporin use.  REACTION: unspecified  . Metronidazole Other (See Comments)    Pounding headaches patient says was bad.  With cipro for diverticulitis rx. Other reaction(s): Other (See Comments) Pounding headaches patient says was bad.  With cipro for diverticulitis rx.    Outpatient Encounter Medications as of 03/22/2022  Medication Sig  . acetaminophen (TYLENOL) 500 MG tablet Take 500 mg by mouth at bedtime as needed.  . apixaban (ELIQUIS) 5 MG TABS tablet Take 1 tablet (5 mg total) by mouth 2 (two) times daily.  . Ascorbic Acid (VITAMIN C) 100 MG tablet Take 500 mg by mouth daily.  . benzonatate (TESSALON PERLES) 100 MG capsule Take 1 capsule (100 mg total) by mouth 3 (three) times daily as needed for cough.  . calcium carbonate (OS-CAL) 1250 (500 Ca) MG chewable tablet Chew 1 tablet by mouth daily.   . diltiazem (CARDIZEM CD) 120 MG 24 hr capsule TAKE 1 CAPSULE BY MOUTH EVERY DAY  . escitalopram (LEXAPRO) 5 MG tablet TAKE 1 TABLET BY MOUTH EVERYDAY AT BEDTIME  . fluticasone (FLONASE) 50 MCG/ACT nasal spray INHALE 2 SPRAYS IN EACH NOSTRIL DAILY X 1 WEEK, THEN 1-2 SPRAYS DAILY. USE LOWEST POSSIBLE DOSE AFTER 1 WEEK  . guaiFENesin (MUCINEX) 600 MG 12 hr tablet Take 600 mg by mouth daily as needed.  . loratadine (CLARITIN) 10 MG tablet Take 1 tablet (10 mg total) by mouth daily.  . Multiple Vitamin (MULTIVITAMIN) tablet Take 1 tablet by mouth daily.  . polyethylene glycol powder (GLYCOLAX/MIRALAX) 17 GM/SCOOP powder Take 17 g by mouth daily. Hold for loose stool  . PREMARIN vaginal cream INSERT 1 GRAM VAGINALLY THREE TIMES A WEEKLY, THEN ONCE WEEKLY  . Probiotic Product (ALIGN) 4 MG CAPS Take 1 capsule by mouth as needed.  . RESTASIS 0.05 % ophthalmic emulsion Place 1 drop into both eyes 2 (two) times daily.   No facility-administered encounter medications on file as of  03/22/2022.    Review of Systems  Constitutional:  Negative for appetite change, chills, fatigue, fever and unexpected weight change.  Respiratory:  Negative for cough, chest tightness, shortness of breath and wheezing.   Cardiovascular:  Negative for chest pain, palpitations and leg swelling.  Gastrointestinal:  Positive for abdominal pain. Negative for abdominal distention, blood in stool, constipation and diarrhea.       Had nausea this morning but no vomiting   Genitourinary:  Negative for difficulty urinating, dysuria, flank pain, frequency and urgency.  Musculoskeletal:  Positive for arthralgias and gait problem. Negative for back pain, joint swelling, myalgias, neck pain and neck stiffness.       Right sciatic pain  Leg cramps   Neurological:  Negative for dizziness, weakness, light-headedness, numbness and headaches.  Psychiatric/Behavioral:  Negative for agitation, behavioral problems, hallucinations, sleep disturbance and suicidal ideas. The patient is not nervous/anxious.        Worsening memory   loss     Immunization History  Administered Date(s) Administered  . Fluad Quad(high Dose 65+) 01/10/2020, 12/10/2021  . Influenza Split 01/25/2011, 01/23/2012  . Influenza Whole 12/31/2007  . Influenza, High Dose Seasonal PF 01/19/2013, 01/10/2014, 01/17/2015, 01/18/2016, 01/02/2017, 01/05/2018, 01/05/2019, 01/05/2019, 02/07/2021  . Influenza-Unspecified 01/02/2017  . PFIZER(Purple Top)SARS-COV-2 Vaccination 05/07/2019, 06/02/2019, 01/31/2020, 08/02/2020  . Pension scheme manager 85yr & up 02/19/2021  . Pneumococcal Conjugate-13 02/23/2013  . Pneumococcal Polysaccharide-23 04/01/2006, 04/01/2018  . Td 04/01/2005  . Td (Adult) 11/22/2016  . Tdap 08/30/2011, 08/03/2021  . Zoster Recombinat (Shingrix) 12/04/2016, 07/27/2017  . Zoster, Live 04/02/2007   Pertinent  Health Maintenance Due  Topic Date Due  . MAMMOGRAM  09/09/2022  . INFLUENZA VACCINE  Completed  .  DEXA SCAN  Completed      08/08/2021    3:12 PM 09/03/2021    1:14 PM 11/16/2021    2:10 PM 01/01/2022   12:54 PM 03/22/2022   12:49 PM  FStarbrickin the past year? 1 1 0 0 0  Was there an injury with Fall? 1 1 0 0 0  Was there an injury with Fall? - Comments Hit head      Fall Risk Category Calculator 2 2 0 0 0  Fall Risk Category Moderate Moderate Low Low Low  Patient Fall Risk Level Moderate fall risk Moderate fall risk Low fall risk Low fall risk Low fall risk  Patient at Risk for Falls Due to History of fall(s) History of fall(s) No Fall Risks No Fall Risks No Fall Risks  Fall risk Follow up Falls evaluation completed Falls evaluation completed;Education provided;Falls prevention discussed Falls evaluation completed Falls evaluation completed Falls evaluation completed   Functional Status Survey:    Vitals:   03/22/22 1249  BP: 128/70  Pulse: 80  Resp: 18  Temp: (!) 96.9 F (36.1 C)  SpO2: 98%  Weight: 162 lb 6 oz (73.7 kg)  Height: 5' 2" (1.575 m)   Body mass index is 29.7 kg/m. Physical Exam Vitals reviewed.  Constitutional:      General: She is not in acute distress.    Appearance: Normal appearance. She is overweight. She is not ill-appearing or diaphoretic.  HENT:     Head: Normocephalic.  Eyes:     General: No scleral icterus.       Right eye: No discharge.        Left eye: No discharge.     Conjunctiva/sclera: Conjunctivae normal.     Pupils: Pupils are equal, round, and reactive to light.  Neck:     Vascular: No carotid bruit.  Cardiovascular:     Rate and Rhythm: Normal rate and regular rhythm.     Pulses: Normal pulses.     Heart sounds: Normal heart sounds. No murmur heard.    No friction rub. No gallop.  Pulmonary:     Effort: Pulmonary effort is normal. No respiratory distress.     Breath sounds: Normal breath sounds. No wheezing, rhonchi or rales.  Chest:     Chest wall: No tenderness.  Abdominal:     General: Bowel sounds are normal.  There is no distension.     Palpations: Abdomen is soft. There is no mass.     Tenderness: There is abdominal tenderness in the right lower quadrant. There is no right CVA tenderness, left CVA tenderness, guarding or rebound.  Musculoskeletal:        General: No swelling or tenderness. Normal range  of motion.     Cervical back: Normal range of motion. No rigidity or tenderness.     Right lower leg: No edema.     Left lower leg: No edema.  Lymphadenopathy:     Cervical: No cervical adenopathy.  Skin:    General: Skin is warm and dry.     Coloration: Skin is not pale.     Findings: No bruising, erythema or rash.  Neurological:     Mental Status: She is alert. Mental status is at baseline.     Motor: No weakness.     Coordination: Coordination normal.     Gait: Gait abnormal.  Psychiatric:        Mood and Affect: Mood normal.        Speech: Speech normal.        Behavior: Behavior normal.        Thought Content: Thought content normal.        Judgment: Judgment normal.    Labs reviewed: Recent Labs    05/07/21 0921 11/09/21 0955  NA 136 139  K 3.8 5.2  CL 101 103  CO2 25 29  GLUCOSE 86 86  BUN 6* 9  CREATININE 0.80 0.95  CALCIUM 9.4 9.5   Recent Labs    05/07/21 0921 11/09/21 0955  AST 20 20  ALT 15 14  BILITOT 1.1 0.9  PROT 6.9 6.7   Recent Labs    05/07/21 0921 08/08/21 1612 11/09/21 0955  WBC 5.3 9.5 5.6  NEUTROABS 3,169 6,346 3,220  HGB 13.7 12.9 13.1  HCT 41.2 39.6 38.6  MCV 94.9 94.3 93.7  PLT 248 281 230   Lab Results  Component Value Date   TSH 5.04 (H) 11/09/2021   No results found for: "HGBA1C" Lab Results  Component Value Date   CHOL 168 11/09/2021   HDL 59 11/09/2021   LDLCALC 86 11/09/2021   LDLDIRECT 100.0 04/07/2019   TRIG 129 11/09/2021   CHOLHDL 2.8 11/09/2021    Significant Diagnostic Results in last 30 days:  No results found.  Assessment/Plan 1. Sciatic pain, right  Pain radiates from hip down to thigh area - start on  tapered prednisone and Robaxin - methocarbamol (ROBAXIN) 500 MG tablet; Take 1 tablet (500 mg total) by mouth every 8 (eight) hours as needed for muscle spasms.  Dispense: 30 tablet; Refill: 1 - predniSONE (DELTASONE) 20 MG tablet; Take 2 tablets (40 mg total) by mouth daily with breakfast for 1 day, THEN 1.5 tablets (30 mg total) daily with breakfast for 1 day, THEN 1 tablet (20 mg total) daily with breakfast for 1 day, THEN 0.5 tablets (10 mg total) daily with breakfast for 1 day.  Dispense: 5 tablet; Refill: 0  2. Cramp in lower leg Reports muscle cramps  Will check electrolytes  - BMP with eGFR([facility])  3. Right lower quadrant abdominal pain Tender to palpation will rule out UTI - Urine Culture; Future  Family/ staff Communication: Reviewed plan of care with patient and daughter verbalized understanding   Labs/tests ordered:  - BMP with eGFR([facility]) - Urine Culture; Future  Next Appointment: Return if symptoms worsen or fail to improve.    C , NP 

## 2022-03-23 LAB — BASIC METABOLIC PANEL WITH GFR
BUN: 7 mg/dL (ref 7–25)
CO2: 24 mmol/L (ref 20–32)
Calcium: 9 mg/dL (ref 8.6–10.4)
Chloride: 92 mmol/L — ABNORMAL LOW (ref 98–110)
Creat: 0.91 mg/dL (ref 0.60–0.95)
Glucose, Bld: 96 mg/dL (ref 65–99)
Potassium: 3.7 mmol/L (ref 3.5–5.3)
Sodium: 128 mmol/L — ABNORMAL LOW (ref 135–146)
eGFR: 61 mL/min/{1.73_m2} (ref >=60)

## 2022-03-23 LAB — URINE CULTURE
MICRO NUMBER:: 14352670
Result:: NO GROWTH
SPECIMEN QUALITY:: ADEQUATE

## 2022-03-29 ENCOUNTER — Other Ambulatory Visit: Payer: Medicare Other

## 2022-04-08 ENCOUNTER — Telehealth: Payer: Self-pay

## 2022-04-08 NOTE — Telephone Encounter (Signed)
Patient called and she is still having sciatic nerve pain. She is almost out of the robaxin. She states it had seemed to be getting better,but today was a bad day. She would like to know what you recommend or is there something else she can take.  Message routed to Marlowe Sax, NP

## 2022-04-08 NOTE — Telephone Encounter (Signed)
Recommend referral to Orthopedic for evaluation.continue with Robaxin.

## 2022-04-09 ENCOUNTER — Other Ambulatory Visit: Payer: Self-pay | Admitting: Family

## 2022-04-09 DIAGNOSIS — M5431 Sciatica, right side: Secondary | ICD-10-CM

## 2022-04-09 NOTE — Progress Notes (Signed)
Orthopedic referral ordered.

## 2022-04-09 NOTE — Telephone Encounter (Signed)
Orthopedic referral ordered

## 2022-04-09 NOTE — Telephone Encounter (Signed)
Patient agrees and would like referral to orthopedist.

## 2022-04-17 ENCOUNTER — Ambulatory Visit: Payer: Medicare Other | Admitting: Sports Medicine

## 2022-04-17 ENCOUNTER — Encounter: Payer: Self-pay | Admitting: Sports Medicine

## 2022-04-17 ENCOUNTER — Ambulatory Visit (INDEPENDENT_AMBULATORY_CARE_PROVIDER_SITE_OTHER): Payer: Medicare Other

## 2022-04-17 ENCOUNTER — Ambulatory Visit: Payer: Self-pay

## 2022-04-17 DIAGNOSIS — M5431 Sciatica, right side: Secondary | ICD-10-CM

## 2022-04-17 DIAGNOSIS — G8929 Other chronic pain: Secondary | ICD-10-CM | POA: Diagnosis not present

## 2022-04-17 DIAGNOSIS — M25551 Pain in right hip: Secondary | ICD-10-CM | POA: Diagnosis not present

## 2022-04-17 DIAGNOSIS — M5441 Lumbago with sciatica, right side: Secondary | ICD-10-CM

## 2022-04-17 MED ORDER — METHOCARBAMOL 500 MG PO TABS: 500.0000 mg | ORAL_TABLET | Freq: Three times a day (TID) | ORAL | 0 refills | Status: DC | PRN

## 2022-04-17 MED ORDER — LIDOCAINE HCL 1 % IJ SOLN
2.0000 mL | INTRAMUSCULAR | Status: AC | PRN
  Administered 2022-04-17: 2 mL

## 2022-04-17 MED ORDER — BUPIVACAINE HCL 0.25 % IJ SOLN
2.0000 mL | INTRAMUSCULAR | Status: AC | PRN
  Administered 2022-04-17: 2 mL via INTRA_ARTICULAR

## 2022-04-17 MED ORDER — METHYLPREDNISOLONE ACETATE 40 MG/ML IJ SUSP
80.0000 mg | INTRAMUSCULAR | Status: AC | PRN
  Administered 2022-04-17: 80 mg via INTRA_ARTICULAR

## 2022-04-17 NOTE — Progress Notes (Signed)
Ashley Lawson - 89 y.o. female MRN [id number]  Date of birth: 1932/12/12  Office Visit Note: Visit Date: 04/17/2022 PCP: Sandrea Hughs, NP Referred by: Sandrea Hughs, NP  Subjective: Chief Complaint  Patient presents with   Lower Back - Pain   HPI: Ashley Lawson is a pleasant 89 y.o. female who presents today for right-low back pain and lateral hip/leg pain.  Ashley Lawson reports her pain started around Christmas time.  Her daughter, Santiago Glad, was with her during the appointment today and told me that she feels her pain started after she started to get back into activity walking on the treadmill for 2 days in a row. She also notifies me that she did see her primary care physician previous to Christmas and they did prescribe a 5-day prednisone taper as well as Robaxin.  Review of internal medicine note on 03/22/2022 shows Robaxin 500 mg every 8 hours as needed, 20 mg prednisone daily x 5 days.  She also had a BMP that showed a creatinine of 0.91, potassium 3.7.  She had a urine culture which was negative for any growth.  The patient states that this did help her pain to some extent.  Her daughter states that it has been hard to localize exactly where her pain is coming from, whether it is more in the back or down the leg.  Ashley Lawson today denies any numbness or tingling but does state that pain shoots down the lateral aspect of the leg, at times past the knee into the calf.  She does point to pain over the lateral trochanteric region.  At times is reporting weakness or cramping in the right leg as well.  Pertinent ROS were reviewed with the patient and found to be negative unless otherwise specified above in HPI.   Assessment & Plan: Visit Diagnoses:  1. Chronic right-sided low back pain with right-sided sciatica   2. Greater trochanteric pain syndrome of right lower extremity    Plan: I discussed with Ashley Lawson and her daughter today that I do think she is suffering from 2 separate issues.  She is  having fairly classic lumbar radicular symptoms down the right leg, seemingly affecting the L5 nerve root, possibly L4.  She has gotten temporary improvement from prednisone taper and a muscle relaxer.  We will get her started in formalized physical therapy.  She does have fairly exquisite greater trochanter pain and I feel some of her lateral leg pain may be radiating from this.  We discussed all treatment options such as oral medication therapy, PT, injection therapy.  Through shared decision making, elected to proceed with ultrasound-guided greater trochanteric injection.  She will follow-up with me in 2 weeks, I would like to see what sort of improvement she gets from this injection can determine whether the bulk of her pain is from the greater trochanter or radicular symptoms coming from the back.  Previous MRI from 5 years ago was reviewed which shows history of compression fracture, but I do not think this is quantifying her pain.  It seems more radicular in nature, no impingement at that time but that was an older scan, may consider repeat MRI at some point going forward if symptoms do not abate.  She has finished her previous prednisone course, I would like her to resume Robaxin 500 mg, taking this twice daily. F/u in 2 weeks.  Follow-up: Return in about 2 weeks (around 05/01/2022) for with Dr. Rolena Infante for R-radiculopathy .   Meds &  Orders: No orders of the defined types were placed in this encounter.   Orders Placed This Encounter  Procedures   Large Joint Inj: R greater trochanter   XR Lumbar Spine Complete   US Guided Needle Placement - No Linked Charges   Ambulatory referral to Physical Therapy     Procedures: Large Joint Inj: R greater trochanter on 04/17/2022 10:42 AM Indications: pain Details: 22 G 3.5 in needle, ultrasound-guided lateral approach Medications: 2 mL lidocaine 1 %; 2 mL bupivacaine 0.25 %; 80 mg methylPREDNISolone acetate 40 MG/ML Outcome: tolerated well, no immediate  complications  US-Guided Greater Trochanteric Bursa Injection, right After discussion on risks/benefits/indications and informed verbal consent was obtained, a timeout was performed. The patient was lying in lateral recumbent position on exam table. Using ultrasound guidance, the greater trochanter was identified. The area overlying the trochanteric bursa was then prepped with Betadine and alcohol swabs. Following sterile precautions, ultrasound was reapplied to visualize needle guidance with a 22-gauge 3.5" needle utilizing an in-plane approach to inject the bursa with 2:2:2 lidocaine:bupivicaine:methylprednislone. Delivery of the injectate was visualized into the region of hypoechoic fluid of the greater trochanteric bursa. Patient tolerated procedure well without immediate complications.    Procedure, treatment alternatives, risks and benefits explained, specific risks discussed. Consent was given by the patient. Immediately prior to procedure a time out was called to verify the correct patient, procedure, equipment, support staff and site/side marked as required. Patient was prepped and draped in the usual sterile fashion.          Clinical History: No specialty comments available.  She reports that she has never smoked. She has never used smokeless tobacco. No results for input(s): "HGBA1C", "LABURIC" in the last 8760 hours.  Objective:    Physical Exam  Gen: Well-appearing, in no acute distress; non-toxic CV: Well-perfused. Warm.  Resp: Breathing unlabored on room air; no wheezing. Psych: Fluid speech in conversation; appropriate affect; normal thought process Neuro: Sensation intact throughout. No gross coordination deficits.   Ortho Exam - Lumbar: No specific midline spinous process TTP.  There is some soft tissue paraspinal tenderness over the right low back and over the right SI joint region.  There is restriction with extension as well as associated pain, no specific block with  flexion.  There is some weakness with great toe extension, otherwise 5/5 strength in the L4-S1 nerve root distribution.  1/4 bilateral patellar reflex.  Neurovascular intact distally.  - Right hip: + Quite exquisite TTP over the lateral trochanter on the right, nontender over the left.  There is pain with resisted hip extension.  No mechanical blocks internal/external rotation.  Imaging: XR Lumbar Spine Complete  Result Date: 04/17/2022 4 views of the lumbar spine including AP, lateral and flexion/extension views were ordered and reviewed by myself.  X-rays demonstrate no significant scoliosis.  There is a small loss of IV disc space at the L4 and L1 level, chronically appearing.  No acute fracture noted.  There is significant DJD more so between L4-L5 and L5-S1.  There is no instability with flexion/extension views.  Aortic calcification noted.  There is facet arthropathy most significant at the L5 region.   Previous MRI was independently reviewed and interpreted by myself from 04/19/2016.  MRI demonstrates an acute to subacute L4 compression fracture with about 10-15% of height loss.  There are also Schmorl's nodes L1 and L3.  Small posterior disc bulge between L3 and L4.  There is some relative osteopenia noted.  There is disc  desiccation throughout the lumbar spine, although no significant anterior/retrograde listhesis.  Narrative & Impression  CLINICAL DATA:  Suprapubic pain, urinary retention.   EXAM: MRI LUMBAR SPINE WITHOUT CONTRAST   TECHNIQUE: Multiplanar, multisequence MR imaging of the lumbar spine was performed. No intravenous contrast was administered.   COMPARISON:  MRI abdomen and pelvis April 14, 2016 and CT abdomen and pelvis April 04, 2016   FINDINGS: SEGMENTATION: For the purposes of this report, the last well-formed intervertebral disc will be described as L5-S1.   ALIGNMENT: Maintenance of the lumbar lordosis. No malalignment.   VERTEBRAE:Acute L4 superior  endplate 2 column compression fracture with low T1 and bright STIR signal with less than 15% height loss. Generally bright T1 bone marrow signal compatible with osteopenia. Acute to subacute L1 superior endplate Schmorl's node. Mild to moderate L2-3 through L5-S1 disc height loss with decreased T2 signal within the disc compatible with desiccation. Minimal acute discogenic endplate changes Z5-6. Scattered old Schmorl's nodes.   CONUS MEDULLARIS: Conus medullaris terminates at L1-2 and demonstrates normal morphology and signal characteristics. Cauda equina is normal.   PARASPINAL AND SOFT TISSUES: Included prevertebral and paraspinal soft tissues are nonacute. Mildly distended urinary bladder.   DISC LEVELS:   T12-L1: Not evaluated on axial sequences): 4 mm broad-based disc bulge without canal stenosis or neural foraminal narrowing.   L1-2: No disc bulge, canal stenosis nor neural foraminal narrowing.   L2-3: Annular bulging. No canal stenosis or neural foraminal narrowing.   L3-4: 4 mm broad-based disc bulge. Mild facet arthropathy and ligamentum flavum redundancy. Moderate canal stenosis. Mild bilateral neural foraminal narrowing.   L4-5: 3 mm broad-based disc bulge. Mild facet arthropathy and ligamentum flavum redundancy. No canal stenosis. Minimal to mild bilateral caudal neural foraminal narrowing.   L5-S1: 3 mm broad-based disc bulge is insert laterally could affect the exited L5 nerves. Mild facet arthropathy. No canal stenosis. Moderate to severe bilateral neural foraminal narrowing.   IMPRESSION: Acute L4 compression fracture (less than 15% height loss). Generalized bone marrow signal compatible with osteopenia.   Degenerative lumbar spine resulting in moderate canal stenosis L3-4.   Neural foraminal narrowing L3-4 through L5-S1: Moderate to severe at L5-S1.     Electronically Signed   By: Elon Alas M.D.   On: 04/19/2016 22:08    Past  Medical/Family/Surgical/Social History: Medications & Allergies reviewed per EMR, new medications updated. Patient Active Problem List   Diagnosis Date Noted   Rectal bleeding 03/03/2020   External hemorrhoids 03/03/2020   Incomplete bladder emptying 11/29/2019   Hypercoagulable state due to atrial fibrillation ([place]) 11/29/2019   Syncope 06/17/2019   Seasonal allergies 06/17/2019   DDD (degenerative disc disease), lumbar    Proctocolitis    CKD (chronic kidney disease), stage III ([place])    Internal hemorrhoids    Dizziness 09/17/2018   Abdominal pain 09/17/2018   AF (paroxysmal atrial fibrillation) ([place]) 09/17/2018   Chest discomfort 05/13/2018   Coagulation defect ([place]) 01/30/2018   Deviated septum 01/30/2018   Epistaxis, recurrent 01/30/2018   Atrial fibrillation with RVR ([place]) 05/02/2017   Rhinitis, allergic 05/31/2015   Renal cyst incidental  01/19/2015   Heartburn 01/14/2014   GERD (gastroesophageal reflux disease) 01/14/2014   Osteoporosis 01/14/2014   IBS (irritable bowel syndrome)    Nail, ingrown 06/22/2012   Arthritis 02/19/2012   Screening, lipid 02/18/2011   Rash and nonspecific skin eruption 02/18/2011   Visit for preventive health examination 02/18/2011   Skin cancer 10/14/2010   Abdominal bloating 10/08/2010  Constipation, outlet dysfunction 01/15/2010   Lower urinary tract symptoms (LUTS) 01/15/2010   SUPRAPUBIC PAIN 08/01/2009   ABDOMINAL PAIN, EPIGASTRIC 06/16/2009   DIVERTICULOSIS, COLON 06/07/2007   Hyperlipidemia 06/05/2007   Anxiety 06/05/2007   Allergic rhinitis 06/05/2007   IBS 06/05/2007   OSTEOPOROSIS 06/05/2007   ABDOMINAL PAIN, GENERALIZED 06/05/2007   Past Medical History:  Diagnosis Date   Abdominal pain 12/2009   hospitalized   Allergic rhinitis    Anxiety    Bladder problem    Chronic constipation    Compression fx, lumbar spine ([facility])    DDD (degenerative disc disease), lumbar    Diverticulosis of colon    Dog bite(E906.0)  08/30/2011   Dysrhythmia    GERD (gastroesophageal reflux disease)    Hepatic steatosis    pt denies   Hyperlipidemia    Hyperplastic colon polyp    IBS (irritable bowel syndrome)    constipation predominant   Internal hemorrhoids    Osteoporosis    dexa 2011 -2.7 hip nl spine   PAF (paroxysmal atrial fibrillation) ([place])    a. dx 05/2017.   PMR (polymyalgia rheumatica) ([facility]) 08/30/2011   under rheum care  on low dose pred 5 mg    Renal disorder    Family History  Problem Relation Age of Onset   Stroke Mother 18   Liver disease Brother 16   Heart attack Father 75   Alzheimer's disease Sister 43   Bladder Cancer Sister 91   Crohn's disease Son    Colon cancer Neg Hx    Esophageal cancer Neg Hx    Stomach cancer Neg Hx    Rectal cancer Neg Hx    Liver cancer Neg Hx    Past Surgical History:  Procedure Laterality Date   ABDOMINAL HYSTERECTOMY     with bladder tact and rectocele repair   BLADDER SURGERY     Streched   CATARACT EXTRACTION  2010   both   COLONOSCOPY  2016 was most recent   CYSTO WITH HYDRODISTENSION N/A 10/05/2019   Procedure: CYSTOSCOPY/HYDRODISTENSION;  Surgeon: Bjorn Loser, MD;  Location: [facility];  Service: Urology;  Laterality: N/A;   TONSILLECTOMY AND ADENOIDECTOMY     Social History   Occupational History   Occupation: retired  Tobacco Use   Smoking status: Never   Smokeless tobacco: Never  Vaping Use   Vaping Use: Never used  Substance and Sexual Activity   Alcohol use: Yes    Alcohol/week: 0.0 standard drinks of alcohol    Comment: occ   Drug use: Never   Sexual activity: Not Currently

## 2022-04-17 NOTE — Progress Notes (Signed)
Low back pain with right leg sciatica pain Saw PCP on 12/29 where prednisone and MR was prescribed; those helped while taking  No history of back surgery Has not had recent xrays  Takes tylenol for pain; helps minimally

## 2022-04-22 ENCOUNTER — Encounter: Payer: Self-pay | Admitting: Family

## 2022-04-22 ENCOUNTER — Ambulatory Visit (INDEPENDENT_AMBULATORY_CARE_PROVIDER_SITE_OTHER): Payer: Medicare Other | Admitting: Family

## 2022-04-22 ENCOUNTER — Ambulatory Visit: Payer: Medicare Other | Admitting: Physical Therapy

## 2022-04-22 VITALS — BP 116/72 | HR 74 | Temp 96.9°F | Resp 17 | Ht 62.0 in | Wt 162.0 lb

## 2022-04-22 DIAGNOSIS — K648 Other hemorrhoids: Secondary | ICD-10-CM | POA: Diagnosis not present

## 2022-04-22 NOTE — Progress Notes (Signed)
Provider: Marlowe Sax FNP-C  Lucita Montoya, [name], NP  Patient Care Team: Sheilah Rayos, [name], NP as PCP - General (Family Medicine) Burnell Blanks, MD as PCP - Cardiology (Cardiology) Bjorn Loser, MD (Urology) Particia Nearing, MD (Dermatology) Luberta Mutter, MD (Ophthalmology) Almedia Balls, MD (Orthopedic Surgery) Ouida Sills  (Rheumatology) Jarome Matin, MD as Consulting Physician (Dermatology) Gatha Mayer, MD as Consulting Physician (Gastroenterology) Rozetta Nunnery, MD (Inactive) as Consulting Physician (Otolaryngology)  Extended Emergency Contact Information Primary Emergency Contact: Apple,Karen Address: [address]          [place] [address] [name] of [place] Phone: [phone] Work Phone: [phone] Mobile Phone: [phone] Relation: Daughter  Code Status:  Full Code  Goals of care: Advanced Directive information    04/22/2022    3:05 PM  Advanced Directives  Does Patient Have a Medical Advance Directive? Yes  Type of Advance Directive [place]  Does patient want to make changes to medical advance directive? No - Patient declined  Copy of [place] in Chart? Yes - validated most recent copy scanned in chart (See row information)     Chief Complaint  Patient presents with   Acute Visit    Patient complains of bleeding hemorrhoids.    HPI:  Pt is a 89 y.o. female seen today for an acute visit for evaluation of bleeding hemorrhoids.states had bleeding on Friday and Saturday but none today.Takes Miralax every once a while for constipation.Does not think she was constipated.Uses preparation H for hemorrhoids.  She was recently seen by Orthopedic for lower back pain and right sciatic nerve pain treated with cortisol injection.she was advised to follow up with Physical therapy.Daughter states was supposed to start today but had to come hear for bleeding hemorrhoids. Cortisol injection  helped for three days then pain recurred.Has follow up appointment with Ortho after completing physical Therapy.    Past Medical History:  Diagnosis Date   Abdominal pain 12/2009   hospitalized   Allergic rhinitis    Anxiety    Bladder problem    Chronic constipation    Compression fx, lumbar spine (HCC)    DDD (degenerative disc disease), lumbar    Diverticulosis of colon    Dog bite(E906.0) 08/30/2011   Dysrhythmia    GERD (gastroesophageal reflux disease)    Hepatic steatosis    pt denies   Hyperlipidemia    Hyperplastic colon polyp    IBS (irritable bowel syndrome)    constipation predominant   Internal hemorrhoids    Osteoporosis    dexa 2011 -2.7 hip nl spine   PAF (paroxysmal atrial fibrillation) ([place])    a. dx 05/2017.   PMR (polymyalgia rheumatica) ([place]) 08/30/2011   under rheum care  on low dose pred 5 mg    Renal disorder    Past Surgical History:  Procedure Laterality Date   ABDOMINAL HYSTERECTOMY     with bladder tact and rectocele repair   BLADDER SURGERY     Streched   CATARACT EXTRACTION  2010   both   COLONOSCOPY  2016 was most recent   CYSTO WITH HYDRODISTENSION N/A 10/05/2019   Procedure: CYSTOSCOPY/HYDRODISTENSION;  Surgeon: Bjorn Loser, MD;  Location: [facility];  Service: Urology;  Laterality: N/A;   TONSILLECTOMY AND ADENOIDECTOMY      Allergies  Allergen Reactions   Alendronate Sodium     upset stomach   Penicillins     Has patient had a PCN reaction causing immediate rash, facial/tongue/throat  swelling, SOB or lightheadedness with hypotension: Yes Has patient had a PCN reaction causing severe rash involving mucus membranes or skin necrosis: No Has patient had a PCN reaction that required hospitalization: No Has patient had a PCN reaction occurring within the last 10 years: No If all of the above answers are "NO", then may proceed with Cephalosporin use.    Amoxicillin Rash    Has patient had a PCN reaction causing immediate rash,  facial/tongue/throat swelling, SOB or lightheadedness with hypotension: yes Has patient had a PCN reaction causing severe rash involving mucus membranes or skin necrosis: no Has patient had a PCN reaction that required hospitalization: no Has patient had PCN reaction within the last 10 years: no  If all of the above answers are "NO", then may proceed with Cephalosporin use.  REACTION: unspecified   Metronidazole Other (See Comments)    Pounding headaches patient says was bad.  With cipro for diverticulitis rx. Other reaction(s): Other (See Comments) Pounding headaches patient says was bad.  With cipro for diverticulitis rx.    Outpatient Encounter Medications as of 04/22/2022  Medication Sig   acetaminophen (TYLENOL) 500 MG tablet Take 500 mg by mouth at bedtime as needed.   apixaban (ELIQUIS) 5 MG TABS tablet Take 1 tablet (5 mg total) by mouth 2 (two) times daily.   Ascorbic Acid (VITAMIN C) 100 MG tablet Take 500 mg by mouth daily.   calcium carbonate (OS-CAL) 1250 (500 Ca) MG chewable tablet Chew 1 tablet by mouth daily.    diltiazem (CARDIZEM CD) 120 MG 24 hr capsule TAKE 1 CAPSULE BY MOUTH EVERY DAY   escitalopram (LEXAPRO) 5 MG tablet TAKE 1 TABLET BY MOUTH EVERYDAY AT BEDTIME   fluticasone (FLONASE) 50 MCG/ACT nasal spray INHALE 2 SPRAYS IN EACH NOSTRIL DAILY X 1 WEEK, THEN 1-2 SPRAYS DAILY. USE LOWEST POSSIBLE DOSE AFTER 1 WEEK   guaiFENesin (MUCINEX) 600 MG 12 hr tablet Take 600 mg by mouth daily as needed.   loratadine (CLARITIN) 10 MG tablet Take 1 tablet (10 mg total) by mouth daily.   methocarbamol (ROBAXIN) 500 MG tablet Take 1 tablet (500 mg total) by mouth every 8 (eight) hours as needed for muscle spasms.   Multiple Vitamin (MULTIVITAMIN) tablet Take 1 tablet by mouth daily.   polyethylene glycol powder (GLYCOLAX/MIRALAX) 17 GM/SCOOP powder Take 17 g by mouth daily. Hold for loose stool   PREMARIN vaginal cream INSERT 1 GRAM VAGINALLY THREE TIMES A WEEKLY, THEN ONCE WEEKLY    Probiotic Product (ALIGN) 4 MG CAPS Take 1 capsule by mouth as needed.   RESTASIS 0.05 % ophthalmic emulsion Place 1 drop into both eyes 2 (two) times daily.   [DISCONTINUED] benzonatate (TESSALON PERLES) 100 MG capsule Take 1 capsule (100 mg total) by mouth 3 (three) times daily as needed for cough.   No facility-administered encounter medications on file as of 04/22/2022.    Review of Systems  Constitutional:  Negative for appetite change, chills, fatigue, fever and unexpected weight change.  Eyes:  Negative for pain, discharge, redness, itching and visual disturbance.  Respiratory:  Negative for cough, chest tightness, shortness of breath and wheezing.   Cardiovascular:  Negative for chest pain, palpitations and leg swelling.  Gastrointestinal:  Negative for abdominal distention, abdominal pain, constipation, diarrhea, nausea and vomiting.       Reports bleeding hemorrhoids   Genitourinary:  Negative for difficulty urinating, dysuria, flank pain, frequency, urgency, vaginal bleeding and vaginal discharge.  Skin:  Negative for color change, pallor  and rash.    Immunization History  Administered Date(s) Administered   Fluad Quad(high Dose 65+) 01/10/2020, 12/10/2021   Influenza Split 01/25/2011, 01/23/2012   Influenza Whole 12/31/2007   Influenza, High Dose Seasonal PF 01/19/2013, 01/10/2014, 01/17/2015, 01/18/2016, 01/02/2017, 01/05/2018, 01/05/2019, 01/05/2019, 02/07/2021   Influenza-Unspecified 01/02/2017   PFIZER(Purple Top)SARS-COV-2 Vaccination 05/07/2019, 06/02/2019, 01/31/2020, 08/02/2020   Pfizer Covid-19 Vaccine Bivalent Booster 53yr & up 02/19/2021   Pneumococcal Conjugate-13 02/23/2013   Pneumococcal Polysaccharide-23 04/01/2006, 04/01/2018   Td 04/01/2005   Td (Adult) 11/22/2016   Tdap 08/30/2011, 08/03/2021   Zoster Recombinat (Shingrix) 12/04/2016, 07/27/2017   Zoster, Live 04/02/2007   Pertinent  Health Maintenance Due  Topic Date Due   MAMMOGRAM  09/09/2022    INFLUENZA VACCINE  Completed   DEXA SCAN  Completed      09/03/2021    1:14 PM 11/16/2021    2:10 PM 01/01/2022   12:54 PM 03/22/2022   12:49 PM 04/22/2022    3:04 PM  Fall Risk  Falls in the past year? 1 0 0 0 0  Was there an injury with Fall? 1 0 0 0 0  Fall Risk Category Calculator 2 0 0 0 0  Fall Risk Category (Retired) Moderate Low Low Low   (RETIRED) Patient Fall Risk Level Moderate fall risk Low fall risk Low fall risk Low fall risk   Patient at Risk for Falls Due to History of fall(s) No Fall Risks No Fall Risks No Fall Risks No Fall Risks  Fall risk Follow up Falls evaluation completed;Education provided;Falls prevention discussed Falls evaluation completed Falls evaluation completed Falls evaluation completed Falls evaluation completed   Functional Status Survey:    Vitals:   04/22/22 1503  BP: 116/72  Pulse: 74  Resp: 17  Temp: (!) 96.9 F (36.1 C)  SpO2: 98%  Weight: 162 lb (73.5 kg)  Height: '5\' 2"'$  (1.575 m)   Body mass index is 29.63 kg/m. Physical Exam Vitals reviewed. Exam conducted with a chaperone present ([name],CMA).  Constitutional:      General: She is not in acute distress.    Appearance: Normal appearance. She is overweight. She is not ill-appearing or diaphoretic.  HENT:     Head: Normocephalic.     Mouth/Throat:     Mouth: Mucous membranes are moist.     Pharynx: Oropharynx is clear. No oropharyngeal exudate or posterior oropharyngeal erythema.  Eyes:     General: No scleral icterus.       Right eye: No discharge.        Left eye: No discharge.     Conjunctiva/sclera: Conjunctivae normal.     Pupils: Pupils are equal, round, and reactive to light.  Cardiovascular:     Rate and Rhythm: Normal rate and regular rhythm.     Pulses: Normal pulses.     Heart sounds: Normal heart sounds. No murmur heard.    No friction rub. No gallop.  Pulmonary:     Effort: Pulmonary effort is normal. No respiratory distress.     Breath sounds:  Normal breath sounds. No wheezing, rhonchi or rales.  Chest:     Chest wall: No tenderness.  Abdominal:     General: Bowel sounds are normal. There is no distension.     Palpations: Abdomen is soft. There is no mass.     Tenderness: There is no abdominal tenderness. There is no right CVA tenderness, left CVA tenderness, guarding or rebound.  Genitourinary:    Exam position: Knee-chest position.  Rectum: Guaiac result negative. Tenderness and internal hemorrhoid present. No mass or anal fissure. Normal anal tone.  Skin:    General: Skin is warm and dry.     Coloration: Skin is not pale.     Findings: No bruising, erythema, lesion or rash.  Neurological:     Mental Status: She is alert and oriented to person, place, and time.     Gait: Gait normal.  Psychiatric:        Mood and Affect: Mood normal.        Speech: Speech normal.        Behavior: Behavior normal.     Labs reviewed: Recent Labs    05/07/21 0921 11/09/21 0955 03/22/22 1338  NA 136 139 128*  K 3.8 5.2 3.7  CL 101 103 92*  CO2 '25 29 24  '$ GLUCOSE 86 86 96  BUN 6* 9 7  CREATININE 0.80 0.95 0.91  CALCIUM 9.4 9.5 9.0   Recent Labs    05/07/21 0921 11/09/21 0955  AST 20 20  ALT 15 14  BILITOT 1.1 0.9  PROT 6.9 6.7   Recent Labs    05/07/21 0921 08/08/21 1612 11/09/21 0955  WBC 5.3 9.5 5.6  NEUTROABS 3,169 6,346 3,220  HGB 13.7 12.9 13.1  HCT 41.2 39.6 38.6  MCV 94.9 94.3 93.7  PLT 248 281 230   Lab Results  Component Value Date   TSH 5.04 (H) 11/09/2021   No results found for: "HGBA1C" Lab Results  Component Value Date   CHOL 168 11/09/2021   HDL 59 11/09/2021   LDLCALC 86 11/09/2021   LDLDIRECT 100.0 04/07/2019   TRIG 129 11/09/2021   CHOLHDL 2.8 11/09/2021    Significant Diagnostic Results in last 30 days:  XR Lumbar Spine Complete  Result Date: 04/17/2022 4 views of the lumbar spine including AP, lateral and flexion/extension views were ordered and reviewed by myself.  X-rays  demonstrate no significant scoliosis.  There is a small loss of IV disc space at the L4 and L1 level, chronically appearing.  No acute fracture noted.  There is significant DJD more so between L4-L5 and L5-S1.  There is no instability with flexion/extension views.  Aortic calcification noted.  There is facet arthropathy most significant at the L5 region.   Assessment/Plan   Internal hemorrhoids Reports recent bleeding for 3 days but none today. - rectal exam internal hemorrhoids tender to palpation without any bleeding. - continue on Preparation H suppositories  - stool Guaiaci negative  - recommend holding Eliquis for 3 days if bleeding recurs  - take Miralax to regulate bowels daily to prevent constipation  - increase fluid intake  - Notify provider or go to ED if symptoms worsen   Family/ staff Communication: Reviewed plan of care with patient verbalized understanding   Labs/tests ordered:  POC Guaiaci stool   Next Appointment: Return if symptoms worsen or fail to improve.   Sandrea Hughs, NP

## 2022-04-23 ENCOUNTER — Other Ambulatory Visit: Payer: Self-pay | Admitting: Adult Health

## 2022-04-23 DIAGNOSIS — I480 Paroxysmal atrial fibrillation: Secondary | ICD-10-CM

## 2022-04-24 ENCOUNTER — Other Ambulatory Visit: Payer: Medicare Other

## 2022-05-03 ENCOUNTER — Ambulatory Visit: Payer: Medicare Other | Admitting: Sports Medicine

## 2022-05-03 ENCOUNTER — Encounter: Payer: Self-pay | Admitting: Sports Medicine

## 2022-05-03 DIAGNOSIS — M25551 Pain in right hip: Secondary | ICD-10-CM

## 2022-05-03 DIAGNOSIS — M5441 Lumbago with sciatica, right side: Secondary | ICD-10-CM

## 2022-05-03 DIAGNOSIS — G8929 Other chronic pain: Secondary | ICD-10-CM | POA: Diagnosis not present

## 2022-05-03 NOTE — Progress Notes (Signed)
Ashley Lawson - 89 y.o. female MRN [id number]  Date of birth: Mar 17, 1933  Office Visit Note: Visit Date: 05/03/2022 PCP: Sandrea Hughs, NP Referred by: Sandrea Hughs, NP  Subjective: Chief Complaint  Patient presents with   Lower Back - Follow-up   HPI: Ashley Lawson is a pleasant 89 y.o. female who presents today for follow-up of low back pain and right leg pain.  She has had low back pain with right radicular symptoms since about Christmas time.  Her pain has intermittently gotten better times with prednisone, also on Robaxin but this makes her sleepy.  During her last visit on 04/17/2022 reviewed proceed with greater trochanteric injection.  This did improve her pain over the hip, but she is still getting some right-sided back pain with shooting pain down the lateral leg to the knee.  Her daughter Santiago Glad is present today for visit and asked if she can stop Robaxin as this makes her sleepy - prescribed by PCP. She feels her pain is about 50% improved from our initial visit.  Pertinent ROS were reviewed with the patient and found to be negative unless otherwise specified above in HPI.   Assessment & Plan: Visit Diagnoses:  1. Chronic right-sided low back pain with right-sided sciatica   2. Greater trochanteric pain syndrome of right lower extremity    Plan: Discussed with Ambur that I do believe she is still dealing with some radicular symptoms likely coming from the low back.  Her pain overall is about 50% improved.  I do think we calm down her greater trochanteric pain with the injection.  She had her initial evaluation for PT upcoming next week.  Would like her to continue with those appointments to have a few sessions to work on the back of the lateral hip abductors.  If her pain continues to improve or if it plateaus, she will follow-up in about 1 month.  If she is not improving, next step would be obtaining MRI of the lumbar spine.  She will discontinue Robaxin today.  May use  Tylenol and/or heat for any pain.  Follow-up: Return in about 4 weeks (around 05/31/2022) for f/u in 3-4 weeks for back and R-leg pain.   Meds & Orders: No orders of the defined types were placed in this encounter.  No orders of the defined types were placed in this encounter.    Procedures: No procedures performed      Clinical History: No specialty comments available.  She reports that she has never smoked. She has never used smokeless tobacco. No results for input(s): "HGBA1C", "LABURIC" in the last 8760 hours.  Objective:    Physical Exam  Gen: Well-appearing, in no acute distress; non-toxic CV: Well-perfused. Warm.  Resp: Breathing unlabored on room air; no wheezing. Psych: Fluid speech in conversation; appropriate affect; normal thought process Neuro: Sensation intact throughout. No gross coordination deficits.   Ortho Exam - Lumbar: Mild TTP around the right side of the paraspinal musculature near L4-L5 region.  Some mild restriction with pain in extension.  Positive straight leg raise on the right, negative straight leg raise on the left. NVI.  - Right hip: Vastly improved TTP over the lateral trochanter on the right.  Some mild weakness with resisted hip abduction bilaterally but right greater than left. No mechanical blocks to internal/external rotation.  Imaging: XR Lumbar Spine Complete 4 views of the lumbar spine including AP, lateral and flexion/extension  views were ordered and reviewed by myself.  X-rays demonstrate no  significant scoliosis.  There is a small loss of IV disc space at the L4  and L1 level, chronically appearing.  No acute fracture noted.  There is  significant DJD more so between L4-L5 and L5-S1.  There is no instability  with flexion/extension views.  Aortic calcification noted.  There is facet  arthropathy most significant at the L5 region.    Past Medical/Family/Surgical/Social History: Medications & Allergies reviewed per EMR, new  medications updated. Patient Active Problem List   Diagnosis Date Noted   Rectal bleeding 03/03/2020   External hemorrhoids 03/03/2020   Incomplete bladder emptying 11/29/2019   Hypercoagulable state due to atrial fibrillation ([place]) 11/29/2019   Syncope 06/17/2019   Seasonal allergies 06/17/2019   DDD (degenerative disc disease), lumbar    Proctocolitis    CKD (chronic kidney disease), stage III ([place])    Internal hemorrhoids    Dizziness 09/17/2018   Abdominal pain 09/17/2018   AF (paroxysmal atrial fibrillation) ([place]) 09/17/2018   Chest discomfort 05/13/2018   Coagulation defect ([place]) 01/30/2018   Deviated septum 01/30/2018   Epistaxis, recurrent 01/30/2018   Atrial fibrillation with RVR ([place]) 05/02/2017   Rhinitis, allergic 05/31/2015   Renal cyst incidental  01/19/2015   Heartburn 01/14/2014   GERD (gastroesophageal reflux disease) 01/14/2014   Osteoporosis 01/14/2014   IBS (irritable bowel syndrome)    Nail, ingrown 06/22/2012   Arthritis 02/19/2012   Screening, lipid 02/18/2011   Rash and nonspecific skin eruption 02/18/2011   Visit for preventive health examination 02/18/2011   Skin cancer 10/14/2010   Abdominal bloating 10/08/2010   Constipation, outlet dysfunction 01/15/2010   Lower urinary tract symptoms (LUTS) 01/15/2010   SUPRAPUBIC PAIN 08/01/2009   ABDOMINAL PAIN, EPIGASTRIC 06/16/2009   DIVERTICULOSIS, COLON 06/07/2007   Hyperlipidemia 06/05/2007   Anxiety 06/05/2007   Allergic rhinitis 06/05/2007   IBS 06/05/2007   OSTEOPOROSIS 06/05/2007   ABDOMINAL PAIN, GENERALIZED 06/05/2007   Past Medical History:  Diagnosis Date   Abdominal pain 12/2009   hospitalized   Allergic rhinitis    Anxiety    Bladder problem    Chronic constipation    Compression fx, lumbar spine (HCC)    DDD (degenerative disc disease), lumbar    Diverticulosis of colon    Dog bite(E906.0) 08/30/2011   Dysrhythmia    GERD (gastroesophageal reflux disease)    Hepatic steatosis     pt denies   Hyperlipidemia    Hyperplastic colon polyp    IBS (irritable bowel syndrome)    constipation predominant   Internal hemorrhoids    Osteoporosis    dexa 2011 -2.7 hip nl spine   PAF (paroxysmal atrial fibrillation) ([place])    a. dx 05/2017.   PMR (polymyalgia rheumatica) (HCC) 08/30/2011   under rheum care  on low dose pred 5 mg    Renal disorder    Family History  Problem Relation Age of Onset   Stroke Mother 93   Liver disease Brother 6   Heart attack Father 46   Alzheimer's disease Sister 77   Bladder Cancer Sister 69   Crohn's disease Son    Colon cancer Neg Hx    Esophageal cancer Neg Hx    Stomach cancer Neg Hx    Rectal cancer Neg Hx    Liver cancer Neg Hx    Past Surgical History:  Procedure Laterality Date   ABDOMINAL HYSTERECTOMY     with bladder tact and rectocele repair   BLADDER SURGERY  Streched   CATARACT EXTRACTION  2010   both   COLONOSCOPY  2016 was most recent   CYSTO WITH HYDRODISTENSION N/A 10/05/2019   Procedure: CYSTOSCOPY/HYDRODISTENSION;  Surgeon: Bjorn Loser, MD;  Location: [facility];  Service: Urology;  Laterality: N/A;   TONSILLECTOMY AND ADENOIDECTOMY     Social History   Occupational History   Occupation: retired  Tobacco Use   Smoking status: Never   Smokeless tobacco: Never  Vaping Use   Vaping Use: Never used  Substance and Sexual Activity   Alcohol use: Yes    Alcohol/week: 0.0 standard drinks of alcohol    Comment: occ   Drug use: Never   Sexual activity: Not Currently

## 2022-05-03 NOTE — Progress Notes (Signed)
Low back pain; have not started PT  Daughter does not believe she would be able to get to PT. (Lack of interest of getting going)

## 2022-05-06 ENCOUNTER — Other Ambulatory Visit: Payer: Self-pay

## 2022-05-06 ENCOUNTER — Ambulatory Visit: Payer: Medicare Other | Admitting: Physical Therapy

## 2022-05-06 ENCOUNTER — Encounter: Payer: Self-pay | Admitting: Physical Therapy

## 2022-05-06 DIAGNOSIS — M25551 Pain in right hip: Secondary | ICD-10-CM | POA: Diagnosis not present

## 2022-05-06 DIAGNOSIS — M5459 Other low back pain: Secondary | ICD-10-CM

## 2022-05-06 DIAGNOSIS — M6281 Muscle weakness (generalized): Secondary | ICD-10-CM | POA: Diagnosis not present

## 2022-05-06 DIAGNOSIS — R262 Difficulty in walking, not elsewhere classified: Secondary | ICD-10-CM | POA: Diagnosis not present

## 2022-05-06 NOTE — Therapy (Addendum)
OUTPATIENT PHYSICAL THERAPY THORACOLUMBAR EVALUATION/DISCHARGE ADDENDUM PHYSICAL THERAPY DISCHARGE SUMMARY  Visits from Start of Care: 1  Current functional level related to goals / functional outcomes: See below   Remaining deficits: See below   Education / Equipment: HEP  Plan:  Patient goals were not met. Patient is being discharged at her request we spoke with patients daughter on the phone 05/20/22 who states she is feeling better and no longer need physical therapy.      Patient Name: Ashley Lawson MRN: [id number] DOB:1932-05-07, 89 y.o., female Today's Date: 05/06/2022  END OF SESSION:  PT End of Session - 05/06/22 1435     Visit Number 1    Number of Visits 15    Date for PT Re-Evaluation 06/17/22    Authorization Type MCR    Authorization - Visit Number --    Progress Note Due on Visit 10    PT Start Time 1344    PT Stop Time 1430    PT Time Calculation (min) 46 min    Activity Tolerance Patient tolerated treatment well    Behavior During Therapy [facility] for tasks assessed/performed             Past Medical History:  Diagnosis Date   Abdominal pain 12/2009   hospitalized   Allergic rhinitis    Anxiety    Bladder problem    Chronic constipation    Compression fx, lumbar spine (HCC)    DDD (degenerative disc disease), lumbar    Diverticulosis of colon    Dog bite(E906.0) 08/30/2011   Dysrhythmia    GERD (gastroesophageal reflux disease)    Hepatic steatosis    pt denies   Hyperlipidemia    Hyperplastic colon polyp    IBS (irritable bowel syndrome)    constipation predominant   Internal hemorrhoids    Osteoporosis    dexa 2011 -2.7 hip nl spine   PAF (paroxysmal atrial fibrillation) ([place])    a. dx 05/2017.   PMR (polymyalgia rheumatica) ([place]) 08/30/2011   under rheum care  on low dose pred 5 mg    Renal disorder    Past Surgical History:  Procedure Laterality Date   ABDOMINAL HYSTERECTOMY     with bladder tact and rectocele repair   BLADDER  SURGERY     Streched   CATARACT EXTRACTION  2010   both   COLONOSCOPY  2016 was most recent   CYSTO WITH HYDRODISTENSION N/A 10/05/2019   Procedure: CYSTOSCOPY/HYDRODISTENSION;  Surgeon: Bjorn Loser, MD;  Location: [facility];  Service: Urology;  Laterality: N/A;   TONSILLECTOMY AND ADENOIDECTOMY     Patient Active Problem List   Diagnosis Date Noted   Rectal bleeding 03/03/2020   External hemorrhoids 03/03/2020   Incomplete bladder emptying 11/29/2019   Hypercoagulable state due to atrial fibrillation ([place]) 11/29/2019   Syncope 06/17/2019   Seasonal allergies 06/17/2019   DDD (degenerative disc disease), lumbar    Proctocolitis    CKD (chronic kidney disease), stage III ([place])    Internal hemorrhoids    Dizziness 09/17/2018   Abdominal pain 09/17/2018   AF (paroxysmal atrial fibrillation) ([place]) 09/17/2018   Chest discomfort 05/13/2018   Coagulation defect ([place]) 01/30/2018   Deviated septum 01/30/2018   Epistaxis, recurrent 01/30/2018   Atrial fibrillation with RVR ([place]) 05/02/2017   Rhinitis, allergic 05/31/2015   Renal cyst incidental  01/19/2015   Heartburn 01/14/2014   GERD (gastroesophageal reflux disease) 01/14/2014   Osteoporosis 01/14/2014   IBS (irritable bowel syndrome)  Nail, ingrown 06/22/2012   Arthritis 02/19/2012   Screening, lipid 02/18/2011   Rash and nonspecific skin eruption 02/18/2011   Visit for preventive health examination 02/18/2011   Skin cancer 10/14/2010   Abdominal bloating 10/08/2010   Constipation, outlet dysfunction 01/15/2010   Lower urinary tract symptoms (LUTS) 01/15/2010   SUPRAPUBIC PAIN 08/01/2009   ABDOMINAL PAIN, EPIGASTRIC 06/16/2009   DIVERTICULOSIS, COLON 06/07/2007   Hyperlipidemia 06/05/2007   Anxiety 06/05/2007   Allergic rhinitis 06/05/2007   IBS 06/05/2007   OSTEOPOROSIS 06/05/2007   ABDOMINAL PAIN, GENERALIZED 06/05/2007    PCP: Sandrea Hughs, NP  REFERRING PROVIDER: Elba Barman, DO  REFERRING DIAG:  [phone] (ICD-10-CM) - Chronic right-sided low back pain with right-sided sciatica  Rationale for Evaluation and Treatment: Rehabilitation  THERAPY DIAG:  Other low back pain  Pain in right hip  Muscle weakness (generalized)  Difficulty in walking, not elsewhere classified  ONSET DATE: on and off for years but most recently 2 weeks ago  SUBJECTIVE:                                                                                                                                                                                           SUBJECTIVE STATEMENT: Patient presents today with right sided LBP and Rt hip pain. She said she has had pain on and off for years, but about two weeks ago noticed it started hurting again. She does not report anything specific that brought the pain on it just started hurting. She reports that her back pain isn't really that bad, its more the pain in her hip that sometimes goes down the side of her leg that is the main problem. She reports when the pain goes down the side of her leg it does not go past her knee, but does feel numb sometimes. She said she has not been as active at home and would like to get back to doing more around the house.   PERTINENT HISTORY:  PMH: compression fx 5 years ago, DDD, osteoporosis, renal disorder  PAIN:  Are you having pain? Yes: NPRS scale: 8/10 at worst, currently 5/10 Pain location: mostly in lateral right hip and down leg, some right sided low back  Pain description: sore and shoots down leg, sometimes numb  Aggravating factors: prolonged sitting, sometimes side lying after a while Relieving factors: tylenol and muscle relaxors, heat, cortisone shot   PRECAUTIONS: None  WEIGHT BEARING RESTRICTIONS: No  FALLS:  Has patient fallen in last 6 months?  Not in the last 6 months, but reports a fall last march where she lost her balance  PLOF: Independent  PATIENT  GOALS: get rid of pain   NEXT MD VISIT:    OBJECTIVE:   DIAGNOSTIC FINDINGS:   X-rays demonstrate no significant scoliosis.  There is a small loss of IV disc space at the L4 and L1 level, chronically appearing.  No acute fracture noted.  There is significant DJD more so between L4-L5 and L5-S1.  There is no instability  with flexion/extension views.  Aortic calcification noted.  There is facet arthropathy most significant at the L5 region.   PATIENT SURVEYS:  FOTO 61% goal 70%  SCREENING FOR RED FLAGS: Bowel or bladder incontinence: No  COGNITION: Overall cognitive status: Within functional limits for tasks assessed    LUMBAR ROM:   AROM eval  Flexion 75%  Extension 50%  Right lateral flexion 75% *  Left lateral flexion WFL  Right rotation 75%  Left rotation WFL   (Blank rows = not tested)  LOWER EXTREMITY ROM:     Active  Right eval Left eval  Hip flexion [facility]  Hip extension    Hip abduction    Hip adduction    Hip internal rotation [facility] WFL  Hip external rotation Limited 50% WFL  Knee flexion    Knee extension    Ankle dorsiflexion    Ankle plantarflexion    Ankle inversion    Ankle eversion     (Blank rows = not tested)  LOWER EXTREMITY MMT:    MMT Right eval Left eval  Hip flexion 3+* 4  Hip extension 4 4+  Hip abduction 4* 4  Hip adduction    Hip internal rotation    Hip external rotation    Knee flexion 5 5  Knee extension 4 4+  Ankle dorsiflexion    Ankle plantarflexion    Ankle inversion    Ankle eversion     (Blank rows = not tested) *pain  LUMBAR SPECIAL TESTS:  Eval:Slump test: Positive on right side, negative on left side  FUNCTIONAL TESTS:  Eval: 5 times sit to stand: 16.35 sec with UE support  Tandem: left behind avg 5 sec and best 9 sec, right behind avg 3 sec and best 6 sec  TODAY'S TREATMENT:  Eval HEP creation and review with demonstration and trial set preformed, see below for details Moist heat pack applied to lateral right hip x10 min in  sidelying  PATIENT EDUCATION: Education details: HEP, PT plan of care Person educated: Patient Education method: Explanation, Demonstration, Verbal cues, and Handouts Education comprehension: verbalized understanding and needs further education  HOME EXERCISE PROGRAM: Access Code: [email or web address] URL: [email or web address] Date: 05/06/2022 Prepared by: Elsie Ra  Exercises - Supine Bridge  - 2 x daily - 6 x weekly - 1-2 sets - 10 reps - 5 hold - Slump Stretch (Mirrored)  - 2 x daily - 6 x weekly - 1 sets - 10 reps - 3 hold - Seated Hip Abduction with Resistance  - 2 x daily - 6 x weekly - 2 sets - 10 reps - Seated Piriformis Stretch  - 2 x daily - 6 x weekly - 1 sets - 3 reps - 30 hold - Mini Squat with Counter Support  - 2 x daily - 6 x weekly - 1-2 sets - 10 reps - Standing Tandem Balance with Counter Support  - 2 x daily - 6 x weekly - 1 sets - 3 reps - 10-15 hold  ASSESSMENT:  CLINICAL IMPRESSION: Patient referred to PT for low back pain and right hip trochanteric bursitis. However, most  of her pain is reported in her hip. She shows limitations in general LE strength especially on the right side, balance, and some lumbar ROM. She will benefit from skilled PT to address these deficits and improve functionally.   OBJECTIVE IMPAIRMENTS: decreased activity tolerance, difficulty walking, decreased balance, decreased endurance, decreased mobility, decreased ROM, decreased strength, impaired flexibility, impaired LE use, postural dysfunction, and pain.  ACTIVITY LIMITATIONS: bending, lifting, carry, locomotion, cleaning, community activity, driving  PERSONAL FACTORS: PMH: compression fx 5 years ago, DDD, osteoporosis, renal disorder are also affecting patient's functional outcome.  REHAB POTENTIAL: Good  CLINICAL DECISION MAKING: Stable/uncomplicated  EVALUATION COMPLEXITY: Low    GOALS: Short term PT Goals Target date: 06/03/2022   Pt will be I and compliant with  HEP. Baseline:  Goal status: New Pt will decrease pain by 25% overall Baseline: Goal status: New  Long term PT goals Target date:06/17/2022  Pt will improve lumbar ROM to [facility] to improve functional mobility Baseline: Goal status: New Pt will improve  hip/knee strength to at least 4+/5- MMT to improve functional strength Baseline: Goal status: New Pt will improve FOTO to at least 70% functional to show improved function Baseline: Goal status: New Pt will reduce pain by overall 50% overall with usual activity Baseline: Goal status: New Pt will be able to improve tandem balance time to 15 sec bilaterally to decrease fall risk.  Baseline: Goal status: New     6. Pt will be able to improve 5 times sit to stand with UE support to 13 sec to show improved functional strength.   PLAN: PT FREQUENCY: 1-2 times per week   PT DURATION: 4-6 weeks  PLANNED INTERVENTIONS (unless contraindicated): aquatic PT, Canalith repositioning, cryotherapy, Electrical stimulation, Iontophoresis with 4 mg/ml dexamethasome, Moist heat, traction, Ultrasound, gait training, Therapeutic exercise, balance training, neuromuscular re-education, patient/family education, prosthetic training, manual techniques, passive ROM, dry needling, taping, vasopnuematic device, vestibular, spinal manipulations, joint manipulations  PLAN FOR NEXT SESSION: review HEP, LE strengthening, balance, lumbar ROM    Tifany Hirsch, Student-PT 05/06/2022, 3:17 PM

## 2022-05-06 NOTE — Addendum Note (Signed)
Addended by: Debbe Odea on: 05/06/2022 03:53 PM   Modules accepted: Orders

## 2022-05-08 ENCOUNTER — Encounter: Payer: Medicare Other | Admitting: Physical Therapy

## 2022-05-13 DIAGNOSIS — I739 Peripheral vascular disease, unspecified: Secondary | ICD-10-CM | POA: Diagnosis not present

## 2022-05-20 ENCOUNTER — Other Ambulatory Visit: Payer: Medicare Other

## 2022-05-20 ENCOUNTER — Encounter: Payer: Medicare Other | Admitting: Physical Therapy

## 2022-05-20 ENCOUNTER — Telehealth: Payer: Self-pay | Admitting: Radiology

## 2022-05-20 NOTE — Telephone Encounter (Signed)
FYI  Patient left voicemail on triage line wanting to let you know that she is going to cancel all of this exercising because she does not need it.  CB [phone]

## 2022-05-22 ENCOUNTER — Encounter: Payer: Medicare Other | Admitting: Physical Therapy

## 2022-05-24 ENCOUNTER — Ambulatory Visit: Payer: Medicare Other | Admitting: Sports Medicine

## 2022-05-24 ENCOUNTER — Ambulatory Visit: Payer: Medicare Other | Admitting: Family

## 2022-05-27 ENCOUNTER — Ambulatory Visit: Payer: Medicare Other | Admitting: Sports Medicine

## 2022-05-29 ENCOUNTER — Encounter: Payer: Medicare Other | Admitting: Physical Therapy

## 2022-06-03 ENCOUNTER — Encounter: Payer: Medicare Other | Admitting: Physical Therapy

## 2022-06-05 ENCOUNTER — Encounter: Payer: Medicare Other | Admitting: Physical Therapy

## 2022-06-10 ENCOUNTER — Encounter: Payer: Medicare Other | Admitting: Physical Therapy

## 2022-06-15 ENCOUNTER — Other Ambulatory Visit: Payer: Self-pay | Admitting: Cardiovascular Disease

## 2022-07-17 DIAGNOSIS — I739 Peripheral vascular disease, unspecified: Secondary | ICD-10-CM | POA: Diagnosis not present

## 2022-07-31 ENCOUNTER — Other Ambulatory Visit: Payer: Self-pay | Admitting: Family

## 2022-07-31 DIAGNOSIS — R0982 Postnasal drip: Secondary | ICD-10-CM

## 2022-08-09 ENCOUNTER — Other Ambulatory Visit: Payer: Self-pay | Admitting: Cardiovascular Disease

## 2022-08-09 DIAGNOSIS — I480 Paroxysmal atrial fibrillation: Secondary | ICD-10-CM

## 2022-08-09 NOTE — Telephone Encounter (Signed)
Eliquis 5mg  refill request received. Patient is 89 years old, weight-73.5kg, Crea-0.91 on 03/22/22, Diagnosis-Afib, and last seen by Jacolyn Reedy on 08/29/21 & has a pending appt on 10/16/22. Dose is appropriate based on dosing criteria. Will send in refill to requested pharmacy.

## 2022-08-12 ENCOUNTER — Other Ambulatory Visit: Payer: Medicare Other

## 2022-08-12 ENCOUNTER — Other Ambulatory Visit: Payer: Self-pay | Admitting: Family

## 2022-08-12 DIAGNOSIS — I480 Paroxysmal atrial fibrillation: Secondary | ICD-10-CM | POA: Diagnosis not present

## 2022-08-12 DIAGNOSIS — R350 Frequency of micturition: Secondary | ICD-10-CM

## 2022-08-12 DIAGNOSIS — E785 Hyperlipidemia, unspecified: Secondary | ICD-10-CM | POA: Diagnosis not present

## 2022-08-12 DIAGNOSIS — N1831 Chronic kidney disease, stage 3a: Secondary | ICD-10-CM | POA: Diagnosis not present

## 2022-08-12 NOTE — Progress Notes (Signed)
Urine culture ordered

## 2022-08-13 ENCOUNTER — Other Ambulatory Visit: Payer: Medicare Other

## 2022-08-13 LAB — CBC WITH DIFFERENTIAL/PLATELET
Absolute Monocytes: 459 cells/uL (ref 200–950)
Basophils Absolute: 51 cells/uL (ref 0–200)
Basophils Relative: 1 %
Eosinophils Absolute: 92 cells/uL (ref 15–500)
Eosinophils Relative: 1.8 %
HCT: 38.3 % (ref 35.0–45.0)
Hemoglobin: 12.9 g/dL (ref 11.7–15.5)
Lymphs Abs: 1571 cells/uL (ref 850–3900)
MCH: 31.8 pg (ref 27.0–33.0)
MCHC: 33.7 g/dL (ref 32.0–36.0)
MCV: 94.3 fL (ref 80.0–100.0)
MPV: 9.1 fL (ref 7.5–12.5)
Monocytes Relative: 9 %
Neutro Abs: 2927 cells/uL (ref 1500–7800)
Neutrophils Relative %: 57.4 %
Platelets: 233 10*3/uL (ref 140–400)
RBC: 4.06 10*6/uL (ref 3.80–5.10)
RDW: 12.6 % (ref 11.0–15.0)
Total Lymphocyte: 30.8 %
WBC: 5.1 10*3/uL (ref 3.8–10.8)

## 2022-08-13 LAB — LIPID PANEL
Cholesterol: 177 mg/dL (ref <200)
HDL: 62 mg/dL (ref >=50)
LDL Cholesterol (Calc): 95 mg/dL (calc)
Non-HDL Cholesterol (Calc): 115 mg/dL (calc) (ref <130)
Total CHOL/HDL Ratio: 2.9 (calc) (ref <5.0)
Triglycerides: 103 mg/dL (ref <150)

## 2022-08-13 LAB — COMPLETE METABOLIC PANEL WITH GFR
AG Ratio: 1.7 (calc) (ref 1.0–2.5)
ALT: 13 U/L (ref 6–29)
AST: 20 U/L (ref 10–35)
Albumin: 4.3 g/dL (ref 3.6–5.1)
Alkaline phosphatase (APISO): 87 U/L (ref 37–153)
BUN: 10 mg/dL (ref 7–25)
CO2: 26 mmol/L (ref 20–32)
Calcium: 9.7 mg/dL (ref 8.6–10.4)
Chloride: 102 mmol/L (ref 98–110)
Creat: 0.83 mg/dL (ref 0.60–0.95)
Globulin: 2.5 g/dL (calc) (ref 1.9–3.7)
Glucose, Bld: 92 mg/dL (ref 65–99)
Potassium: 4.7 mmol/L (ref 3.5–5.3)
Sodium: 139 mmol/L (ref 135–146)
Total Bilirubin: 0.8 mg/dL (ref 0.2–1.2)
Total Protein: 6.8 g/dL (ref 6.1–8.1)
eGFR: 68 mL/min/{1.73_m2} (ref >=60)

## 2022-08-13 LAB — TSH: TSH: 4.19 mIU/L (ref 0.40–4.50)

## 2022-08-14 ENCOUNTER — Ambulatory Visit (INDEPENDENT_AMBULATORY_CARE_PROVIDER_SITE_OTHER): Payer: Medicare Other | Admitting: Family

## 2022-08-14 ENCOUNTER — Encounter: Payer: Self-pay | Admitting: Family

## 2022-08-14 VITALS — BP 124/76 | HR 67 | Temp 98.6°F | Resp 18 | Ht 60.0 in | Wt 163.0 lb

## 2022-08-14 DIAGNOSIS — E785 Hyperlipidemia, unspecified: Secondary | ICD-10-CM

## 2022-08-14 DIAGNOSIS — K5901 Slow transit constipation: Secondary | ICD-10-CM

## 2022-08-14 DIAGNOSIS — D6869 Other thrombophilia: Secondary | ICD-10-CM | POA: Diagnosis not present

## 2022-08-14 DIAGNOSIS — M159 Polyosteoarthritis, unspecified: Secondary | ICD-10-CM | POA: Diagnosis not present

## 2022-08-14 DIAGNOSIS — I480 Paroxysmal atrial fibrillation: Secondary | ICD-10-CM | POA: Diagnosis not present

## 2022-08-14 LAB — URINE CULTURE
MICRO NUMBER:: 14948368
SPECIMEN QUALITY:: ADEQUATE

## 2022-08-14 MED ORDER — ACETAMINOPHEN 500 MG PO TABS: 500.0000 mg | ORAL_TABLET | Freq: Two times a day (BID) | ORAL | 3 refills | Status: DC

## 2022-08-14 NOTE — Progress Notes (Signed)
Provider: Richarda Blade FNP-C   Georgine Wiltse, Donalee Citrin, NP  Patient Care Team: Inda Mcglothen, Donalee Citrin, NP as PCP - General (Family Medicine) Kathleene Hazel, MD as PCP - Cardiology (Cardiology) Alfredo Martinez, MD (Urology) Alanson Puls, MD (Dermatology) Maris Berger, MD (Ophthalmology) Lunette Stands, MD (Orthopedic Surgery) Dareen Piano  (Rheumatology) Donzetta Starch, MD as Consulting Physician (Dermatology) Iva Boop, MD as Consulting Physician (Gastroenterology) Drema Halon, MD (Inactive) as Consulting Physician (Otolaryngology)  Extended Emergency Contact Information Primary Emergency Contact: Apple,Karen Address: [address]          [place] [address] [place] Home Phone: [phone] Work Phone: [phone] Mobile Phone: [phone] Relation: Daughter  Code Status:  Full Code  Goals of care: Advanced Directive information    05/06/2022    2:33 PM  Advanced Directives  Does Patient Have a Medical Advance Directive? Yes  Type of Estate agent of [place];Living will  Copy of Healthcare Power of Attorney in Chart? Yes - validated most recent copy scanned in chart (See row information)     Chief Complaint  Patient presents with   Medical Management of Chronic Issues    Patient is here for follow up for chronic conditions and lab review    HPI:  Pt is a 89 y.o. female seen today for medical management of chronic diseases. She is here with her daughter Graciela Husbands who provides additional HPI information.   Has a sitter that comes to sit with her when the daughter goes to work.does exercise,Games and talks with her which has helped with loneliness.   No fall episode reported though has a bruise over right eyelid which does not recall hitting on something.states applied eyeliner but area is bruised. Weight is stable.Appetite described as good.  Immunization is up to date except COVID-19 vaccine requires 6th  dose though daughter states had another vaccine in 2023 will bring her COVID-19 vaccine card then will update.     Past Medical History:  Diagnosis Date   Abdominal pain 12/2009   hospitalized   Allergic rhinitis    Anxiety    Bladder problem    Chronic constipation    Compression fx, lumbar spine (HCC)    DDD (degenerative disc disease), lumbar    Diverticulosis of colon    Dog bite(E906.0) 08/30/2011   Dysrhythmia    GERD (gastroesophageal reflux disease)    Hepatic steatosis    pt denies   Hyperlipidemia    Hyperplastic colon polyp    IBS (irritable bowel syndrome)    constipation predominant   Internal hemorrhoids    Osteoporosis    dexa 2011 -2.7 hip nl spine   PAF (paroxysmal atrial fibrillation) (HCC)    a. dx 05/2017.   PMR (polymyalgia rheumatica) (HCC) 08/30/2011   under rheum care  on low dose pred 5 mg    Renal disorder    Past Surgical History:  Procedure Laterality Date   ABDOMINAL HYSTERECTOMY     with bladder tact and rectocele repair   BLADDER SURGERY     Streched   CATARACT EXTRACTION  2010   both   COLONOSCOPY  2016 was most recent   CYSTO WITH HYDRODISTENSION N/A 10/05/2019   Procedure: CYSTOSCOPY/HYDRODISTENSION;  Surgeon: Alfredo Martinez, MD;  Location: [facility];  Service: Urology;  Laterality: N/A;   TONSILLECTOMY AND ADENOIDECTOMY      Allergies  Allergen Reactions   Alendronate Sodium     upset stomach   Penicillins  Has patient had a PCN reaction causing immediate rash, facial/tongue/throat swelling, SOB or lightheadedness with hypotension: Yes Has patient had a PCN reaction causing severe rash involving mucus membranes or skin necrosis: No Has patient had a PCN reaction that required hospitalization: No Has patient had a PCN reaction occurring within the last 10 years: No If all of the above answers are "NO", then may proceed with Cephalosporin use.    Amoxicillin Rash    Has patient had a PCN reaction causing immediate rash,  facial/tongue/throat swelling, SOB or lightheadedness with hypotension: yes Has patient had a PCN reaction causing severe rash involving mucus membranes or skin necrosis: no Has patient had a PCN reaction that required hospitalization: no Has patient had PCN reaction within the last 10 years: no  If all of the above answers are "NO", then may proceed with Cephalosporin use.  REACTION: unspecified   Metronidazole Other (See Comments)    Pounding headaches patient says was bad.  With cipro for diverticulitis rx. Other reaction(s): Other (See Comments) Pounding headaches patient says was bad.  With cipro for diverticulitis rx.    Allergies as of 08/14/2022       Reactions   Alendronate Sodium    upset stomach   Penicillins    Has patient had a PCN reaction causing immediate rash, facial/tongue/throat swelling, SOB or lightheadedness with hypotension: Yes Has patient had a PCN reaction causing severe rash involving mucus membranes or skin necrosis: No Has patient had a PCN reaction that required hospitalization: No Has patient had a PCN reaction occurring within the last 10 years: No If all of the above answers are "NO", then may proceed with Cephalosporin use.   Amoxicillin Rash   Has patient had a PCN reaction causing immediate rash, facial/tongue/throat swelling, SOB or lightheadedness with hypotension: yes Has patient had a PCN reaction causing severe rash involving mucus membranes or skin necrosis: no Has patient had a PCN reaction that required hospitalization: no Has patient had PCN reaction within the last 10 years: no  If all of the above answers are "NO", then may proceed with Cephalosporin use. REACTION: unspecified   Metronidazole Other (See Comments)   Pounding headaches patient says was bad.  With cipro for diverticulitis rx. Other reaction(s): Other (See Comments) Pounding headaches patient says was bad.  With cipro for diverticulitis rx.        Medication List         Accurate as of Aug 14, 2022  3:29 PM. If you have any questions, ask your nurse or doctor.          STOP taking these medications    methocarbamol 500 MG tablet Commonly known as: ROBAXIN Stopped by: Donalee Citrin Colsen Modi, NP   Restasis 0.05 % ophthalmic emulsion Generic drug: cycloSPORINE Stopped by: Caesar Bookman, NP       TAKE these medications    acetaminophen 500 MG tablet Commonly known as: TYLENOL Take 500 mg by mouth at bedtime as needed.   Align 4 MG Caps Take 1 capsule by mouth as needed.   calcium carbonate 1250 (500 Ca) MG chewable tablet Commonly known as: OS-CAL Chew 1 tablet by mouth daily.   diltiazem 120 MG 24 hr capsule Commonly known as: CARDIZEM CD TAKE 1 CAPSULE BY MOUTH EVERY DAY   Eliquis 5 MG Tabs tablet Generic drug: apixaban TAKE 1 TABLET BY MOUTH TWICE A DAY   escitalopram 5 MG tablet Commonly known as: LEXAPRO TAKE 1 TABLET BY MOUTH EVERYDAY  AT BEDTIME   fluticasone 50 MCG/ACT nasal spray Commonly known as: FLONASE INHALE 2 SPRAYS IN EACH NOSTRIL DAILY X 1 WEEK, THEN 1-2 SPRAYS DAILY. USE LOWEST POSSIBLE DOSE AFTER 1 WEEK   guaiFENesin 600 MG 12 hr tablet Commonly known as: MUCINEX Take 600 mg by mouth daily as needed.   loratadine 10 MG tablet Commonly known as: CLARITIN TAKE 1 TABLET BY MOUTH EVERY DAY   multivitamin tablet Take 1 tablet by mouth daily.   polyethylene glycol powder 17 GM/SCOOP powder Commonly known as: GLYCOLAX/MIRALAX Take 17 g by mouth daily. Hold for loose stool   Premarin vaginal cream Generic drug: conjugated estrogens INSERT 1 GRAM VAGINALLY THREE TIMES A WEEKLY, THEN ONCE WEEKLY   vitamin C 100 MG tablet Take 500 mg by mouth daily.   Xiidra 5 % Soln Generic drug: Lifitegrast Apply 1 drop to eye 2 (two) times daily.        Review of Systems  Constitutional:  Negative for appetite change, chills, fatigue, fever and unexpected weight change.  HENT:  Negative for congestion, dental  problem, ear discharge, ear pain, facial swelling, hearing loss, nosebleeds, postnasal drip, rhinorrhea, sinus pressure, sinus pain, sneezing, sore throat, tinnitus and trouble swallowing.   Eyes:  Negative for pain, discharge, redness, itching and visual disturbance.  Respiratory:  Negative for cough, chest tightness, shortness of breath and wheezing.   Cardiovascular:  Negative for chest pain, palpitations and leg swelling.  Gastrointestinal:  Negative for abdominal distention, abdominal pain, blood in stool, constipation, diarrhea, nausea and vomiting.  Endocrine: Negative for cold intolerance, heat intolerance, polydipsia, polyphagia and polyuria.  Genitourinary:  Negative for difficulty urinating, dysuria, flank pain, frequency and urgency.  Musculoskeletal:  Negative for arthralgias, back pain, gait problem, joint swelling, myalgias, neck pain and neck stiffness.  Skin:  Negative for color change, pallor, rash and wound.  Neurological:  Negative for dizziness, syncope, speech difficulty, weakness, light-headedness, numbness and headaches.  Hematological:  Does not bruise/bleed easily.  Psychiatric/Behavioral:  Negative for agitation, behavioral problems, confusion, hallucinations, self-injury, sleep disturbance and suicidal ideas. The patient is not nervous/anxious.     Immunization History  Administered Date(s) Administered   Fluad Quad(high Dose 65+) 01/10/2020, 12/10/2021   Influenza Split 01/25/2011, 01/23/2012   Influenza Whole 12/31/2007   Influenza, High Dose Seasonal PF 01/19/2013, 01/10/2014, 01/17/2015, 01/18/2016, 01/02/2017, 01/05/2018, 01/05/2019, 01/05/2019, 02/07/2021   Influenza-Unspecified 01/02/2017   PFIZER(Purple Top)SARS-COV-2 Vaccination 05/07/2019, 06/02/2019, 01/31/2020, 08/02/2020   Pfizer Covid-19 Vaccine Bivalent Booster 75yrs & up 02/19/2021   Pneumococcal Conjugate-13 02/23/2013   Pneumococcal Polysaccharide-23 04/01/2006, 04/01/2018   Td 04/01/2005   Td  (Adult) 11/22/2016   Tdap 08/30/2011, 08/03/2021   Zoster Recombinat (Shingrix) 12/04/2016, 07/27/2017   Zoster, Live 04/02/2007   Pertinent  Health Maintenance Due  Topic Date Due   MAMMOGRAM  09/09/2022   INFLUENZA VACCINE  10/31/2022   DEXA SCAN  Completed      09/03/2021    1:14 PM 11/16/2021    2:10 PM 01/01/2022   12:54 PM 03/22/2022   12:49 PM 04/22/2022    3:04 PM  Fall Risk  Falls in the past year? 1 0 0 0 0  Was there an injury with Fall? 1 0 0 0 0  Fall Risk Category Calculator 2 0 0 0 0  Fall Risk Category (Retired) Moderate Low Low Low   (RETIRED) Patient Fall Risk Level Moderate fall risk Low fall risk Low fall risk Low fall risk   Patient at Risk for Falls  Due to History of fall(s) No Fall Risks No Fall Risks No Fall Risks No Fall Risks  Fall risk Follow up Falls evaluation completed;Education provided;Falls prevention discussed Falls evaluation completed Falls evaluation completed Falls evaluation completed Falls evaluation completed   Functional Status Survey:    Vitals:   08/14/22 1508  BP: 124/76  Pulse: 67  Resp: 18  Temp: 98.6 F (37 C)  SpO2: 97%  Weight: 163 lb (73.9 kg)  Height: 5' (1.524 m)   Body mass index is 31.83 kg/m. Physical Exam Vitals reviewed.  Constitutional:      General: She is not in acute distress.    Appearance: Normal appearance. She is obese. She is not ill-appearing or diaphoretic.  HENT:     Head: Normocephalic.     Right Ear: Tympanic membrane, ear canal and external ear normal. There is no impacted cerumen.     Left Ear: Tympanic membrane, ear canal and external ear normal. There is no impacted cerumen.     Nose: Nose normal. No congestion or rhinorrhea.     Mouth/Throat:     Mouth: Mucous membranes are moist.     Pharynx: Oropharynx is clear. No oropharyngeal exudate or posterior oropharyngeal erythema.  Eyes:     General: No scleral icterus.       Right eye: No discharge.        Left eye: No discharge.      Extraocular Movements: Extraocular movements intact.     Conjunctiva/sclera: Conjunctivae normal.     Pupils: Pupils are equal, round, and reactive to light.  Neck:     Vascular: No carotid bruit.  Cardiovascular:     Rate and Rhythm: Normal rate and regular rhythm.     Pulses: Normal pulses.     Heart sounds: Normal heart sounds. No murmur heard.    No friction rub. No gallop.  Pulmonary:     Effort: Pulmonary effort is normal. No respiratory distress.     Breath sounds: Normal breath sounds. No wheezing, rhonchi or rales.  Chest:     Chest wall: No tenderness.  Abdominal:     General: Bowel sounds are normal. There is no distension.     Palpations: Abdomen is soft. There is no mass.     Tenderness: There is no abdominal tenderness. There is no right CVA tenderness, left CVA tenderness, guarding or rebound.  Musculoskeletal:        General: No swelling or tenderness. Normal range of motion.     Cervical back: Normal range of motion. No rigidity or tenderness.     Right lower leg: No edema.     Left lower leg: No edema.  Lymphadenopathy:     Cervical: No cervical adenopathy.  Skin:    General: Skin is warm and dry.     Coloration: Skin is not pale.     Findings: No bruising, erythema, lesion or rash.  Neurological:     Mental Status: She is alert and oriented to person, place, and time.     Cranial Nerves: No cranial nerve deficit.     Sensory: No sensory deficit.     Motor: No weakness.     Coordination: Coordination normal.     Gait: Gait normal.  Psychiatric:        Mood and Affect: Mood normal.        Speech: Speech normal.        Behavior: Behavior normal.        Thought Content: Thought  content normal.        Judgment: Judgment normal.     Labs reviewed: Recent Labs    11/09/21 0955 03/22/22 1338 08/12/22 1050  NA 139 128* 139  K 5.2 3.7 4.7  CL 103 92* 102  CO2 29 24 26   GLUCOSE 86 96 92  BUN 9 7 10   CREATININE 0.95 0.91 0.83  CALCIUM 9.5 9.0 9.7    Recent Labs    11/09/21 0955 08/12/22 1050  AST 20 20  ALT 14 13  BILITOT 0.9 0.8  PROT 6.7 6.8   Recent Labs    11/09/21 0955 08/12/22 1050  WBC 5.6 5.1  NEUTROABS 3,220 2,927  HGB 13.1 12.9  HCT 38.6 38.3  MCV 93.7 94.3  PLT 230 233   Lab Results  Component Value Date   TSH 4.19 08/12/2022   No results found for: "HGBA1C" Lab Results  Component Value Date   CHOL 177 08/12/2022   HDL 62 08/12/2022   LDLCALC 95 08/12/2022   LDLDIRECT 100.0 04/07/2019   TRIG 103 08/12/2022   CHOLHDL 2.9 08/12/2022    Significant Diagnostic Results in last 30 days:  No results found.  Assessment/Plan 1. AF (paroxysmal atrial fibrillation) (HCC) HR controlled  - continue on Eliquis no signs of bleeding reported.  - TSH; Future - COMPLETE METABOLIC PANEL WITH GFR; Future - CBC with Differential/Platelet; Future  2. Hyperlipidemia LDL goal <100 LDL at goal  - continue dietary modification and exercise  - Lipid panel; Future  3. Primary osteoarthritis involving multiple joints Increase tylenol to twice daily  - acetaminophen (TYLENOL) 500 MG tablet; Take 1 tablet (500 mg total) by mouth in the morning and at bedtime.  Dispense: 60 tablet; Refill: 3  4. Slow transit constipation Continue on Miralax  - encourage fiber in the diet and Hydration.  5. Acquired thrombophilia (HCC) On Eliquis for Afib  - continue to monitor for signs of bleeding   Family/ staff Communication: Reviewed plan of care with patient verbalized understanding   Labs/tests ordered: Future labs: - CBC with Differential/Platelet - CMP with eGFR([facility]) - TSH - Lipid panel  Next Appointment : Return in about 6 months (around 02/14/2023) for medical mangement of chronic issues.Caesar Bookman, NP

## 2022-08-15 ENCOUNTER — Other Ambulatory Visit: Payer: Self-pay

## 2022-08-15 ENCOUNTER — Other Ambulatory Visit: Payer: Self-pay | Admitting: Family

## 2022-08-15 DIAGNOSIS — J019 Acute sinusitis, unspecified: Secondary | ICD-10-CM

## 2022-08-15 DIAGNOSIS — N390 Urinary tract infection, site not specified: Secondary | ICD-10-CM

## 2022-08-15 MED ORDER — CIPROFLOXACIN HCL 500 MG PO TABS
500.0000 mg | ORAL_TABLET | Freq: Two times a day (BID) | ORAL | 0 refills | Status: AC
Start: 2022-08-15 — End: 2022-08-25

## 2022-08-15 MED ORDER — SACCHAROMYCES BOULARDII 250 MG PO CAPS
250.0000 mg | ORAL_CAPSULE | Freq: Two times a day (BID) | ORAL | 0 refills | Status: AC
Start: 2022-08-15 — End: 2022-08-25

## 2022-08-15 NOTE — Telephone Encounter (Signed)
High risk or very high risk warning populated when attempting to refill medication. RX request sent to PCP for review and approval if warranted.   

## 2022-08-16 ENCOUNTER — Ambulatory Visit: Payer: Medicare Other | Admitting: Family

## 2022-08-21 ENCOUNTER — Ambulatory Visit (INDEPENDENT_AMBULATORY_CARE_PROVIDER_SITE_OTHER): Payer: Medicare Other | Admitting: Adult Health

## 2022-08-21 ENCOUNTER — Encounter: Payer: Self-pay | Admitting: Adult Health

## 2022-08-21 VITALS — BP 122/78 | HR 78 | Temp 97.6°F | Resp 17 | Ht 60.0 in | Wt 164.0 lb

## 2022-08-21 DIAGNOSIS — R350 Frequency of micturition: Secondary | ICD-10-CM

## 2022-08-21 DIAGNOSIS — N390 Urinary tract infection, site not specified: Secondary | ICD-10-CM

## 2022-08-21 DIAGNOSIS — B9620 Unspecified Escherichia coli [E. coli] as the cause of diseases classified elsewhere: Secondary | ICD-10-CM

## 2022-08-21 LAB — POCT URINALYSIS DIP (MANUAL ENTRY)
Bilirubin, UA: NEGATIVE
Glucose, UA: NEGATIVE mg/dL
Ketones, POC UA: NEGATIVE mg/dL
Leukocytes, UA: NEGATIVE
Nitrite, UA: NEGATIVE
Protein Ur, POC: NEGATIVE mg/dL
Spec Grav, UA: <=1.005 — AB (ref 1.010–1.025)
Urobilinogen, UA: 0.2 E.U./dL
pH, UA: 8 (ref 5.0–8.0)

## 2022-08-21 NOTE — Addendum Note (Signed)
Addended by: Jonni Sanger on: 08/21/2022 10:42 AM   Modules accepted: Orders

## 2022-08-21 NOTE — Patient Instructions (Signed)
Avoid Milk of magnesia due to risk of falls Will stop cipro and has been placed on allergy list Will give further antibiotics if indicated.  She will bring back a urine specimen.

## 2022-08-21 NOTE — Progress Notes (Signed)
Location:  [facility]   Place of Service:  clinic     CODE STATUS:   Allergies  Allergen Reactions   Alendronate Sodium     upset stomach   Penicillins     Has patient had a PCN reaction causing immediate rash, facial/tongue/throat swelling, SOB or lightheadedness with hypotension: Yes Has patient had a PCN reaction causing severe rash involving mucus membranes or skin necrosis: No Has patient had a PCN reaction that required hospitalization: No Has patient had a PCN reaction occurring within the last 10 years: No If all of the above answers are "NO", then may proceed with Cephalosporin use.    Amoxicillin Rash    Has patient had a PCN reaction causing immediate rash, facial/tongue/throat swelling, SOB or lightheadedness with hypotension: yes Has patient had a PCN reaction causing severe rash involving mucus membranes or skin necrosis: no Has patient had a PCN reaction that required hospitalization: no Has patient had PCN reaction within the last 10 years: no  If all of the above answers are "NO", then may proceed with Cephalosporin use.  REACTION: unspecified   Metronidazole Other (See Comments)    Pounding headaches patient says was bad.  With cipro for diverticulitis rx. Other reaction(s): Other (See Comments) Pounding headaches patient says was bad.  With cipro for diverticulitis rx.    Chief Complaint  Patient presents with   Acute Visit    Patient is being seen for medication reaction    HPI:  She was diagnosed with e-coli UTI. She developed side effects; such as headaches; swelling of ankles. Initially the specimen was collected per routine lab work. She was found to have e-coli. She was given a prescription for 20 tabs of cipro; she has taken 6 of them. The swelling in her ankles went down after stopping the abt. Today she is not feeling bad; but not great; is feeling sleepy.   Past Medical History:  Diagnosis Date   Abdominal pain 12/2009    hospitalized   Allergic rhinitis    Anxiety    Bladder problem    Chronic constipation    Compression fx, lumbar spine (HCC)    DDD (degenerative disc disease), lumbar    Diverticulosis of colon    Dog bite(E906.0) 08/30/2011   Dysrhythmia    GERD (gastroesophageal reflux disease)    Hepatic steatosis    pt denies   Hyperlipidemia    Hyperplastic colon polyp    IBS (irritable bowel syndrome)    constipation predominant   Internal hemorrhoids    Osteoporosis    dexa 2011 -2.7 hip nl spine   PAF (paroxysmal atrial fibrillation) (HCC)    a. dx 05/2017.   PMR (polymyalgia rheumatica) (HCC) 08/30/2011   under rheum care  on low dose pred 5 mg    Renal disorder     Past Surgical History:  Procedure Laterality Date   ABDOMINAL HYSTERECTOMY     with bladder tact and rectocele repair   BLADDER SURGERY     Streched   CATARACT EXTRACTION  2010   both   COLONOSCOPY  2016 was most recent   CYSTO WITH HYDRODISTENSION N/A 10/05/2019   Procedure: CYSTOSCOPY/HYDRODISTENSION;  Surgeon: Alfredo Martinez, MD;  Location: [facility];  Service: Urology;  Laterality: N/A;   TONSILLECTOMY AND ADENOIDECTOMY      Social History   Socioeconomic History   Marital status: Widowed    Spouse name: Not on file   Number of children: Not on file  Years of education: Not on file   Highest education level: Not on file  Occupational History   Occupation: retired  Tobacco Use   Smoking status: Never   Smokeless tobacco: Never  Vaping Use   Vaping Use: Never used  Substance and Sexual Activity   Alcohol use: Yes    Alcohol/week: 0.0 standard drinks of alcohol    Comment: occ   Drug use: Never   Sexual activity: Not Currently  Other Topics Concern   Not on file  Social History Narrative   Retired   Regular exercise- yes   Widowed   At home with children and GKs    HH of 2     Puppy   Plays cards is social and active       Diet:  No      Do you drink/ eat things with caffeine? Yes       Marital status:   Widow                            What year were you married ? [id number]      Do you live in a house, apartment,assistred living, condo, trailer, etc.)? Townhouse      Is it one or more stories? 2      How many persons live in your home ? 2      Do you have any pets in your home ?(please list)  None      Highest Level of education completed:  12 th Grade      Current or past profession: Retail Sales @ [employer]      Do you exercise?    Some                          Type & how often  Walking      ADVANCED DIRECTIVES (Please bring copies)      Do you have a living will? Yes      Do you have a DNR form?  Yes                     If not, do you want to discuss one?       Do you have signed POA?HPOA forms? Yes                If so, please bring to your appointment      FUNCTIONAL STATUS- To be completed by Spouse / child / Staff       Do you have difficulty bathing or dressing yourself ? No      Do you have difficulty preparing food or eating ?  No      Do you have difficulty managing your mediation ? No      Do you have difficulty managing your finances ? No      Do you have difficulty affording your medication ? No      Social Determinants of Corporate investment banker Strain: Not on file  Food Insecurity: Not on file  Transportation Needs: Not on file  Physical Activity: Not on file  Stress: Not on file  Social Connections: Not on file  Intimate Partner Violence: Not on file   Family History  Problem Relation Age of Onset   Stroke Mother 27   Liver disease Brother 74   Heart attack Father 49   Alzheimer's disease Sister 68  Bladder Cancer Sister 31   Crohn's disease Son    Colon cancer Neg Hx    Esophageal cancer Neg Hx    Stomach cancer Neg Hx    Rectal cancer Neg Hx    Liver cancer Neg Hx       VITAL SIGNS BP 122/78   Pulse 78   Temp 97.6 F (36.4 C) (Temporal)   Resp 17   Ht 5' (1.524 m)   Wt 164 lb (74.4 kg)   SpO2 95%   BMI  32.03 kg/m   Outpatient Encounter Medications as of 08/21/2022  Medication Sig   acetaminophen (TYLENOL) 500 MG tablet Take 1 tablet (500 mg total) by mouth in the morning and at bedtime.   Ascorbic Acid (VITAMIN C) 100 MG tablet Take 500 mg by mouth daily.   calcium carbonate (OS-CAL) 1250 (500 Ca) MG chewable tablet Chew 1 tablet by mouth daily.    ciprofloxacin (CIPRO) 500 MG tablet Take 1 tablet (500 mg total) by mouth 2 (two) times daily for 10 days.   diltiazem (CARDIZEM CD) 120 MG 24 hr capsule TAKE 1 CAPSULE BY MOUTH EVERY DAY   ELIQUIS 5 MG TABS tablet TAKE 1 TABLET BY MOUTH TWICE A DAY   escitalopram (LEXAPRO) 5 MG tablet TAKE 1 TABLET BY MOUTH EVERYDAY AT BEDTIME   fluticasone (FLONASE) 50 MCG/ACT nasal spray INHALE 2 SPRAYS IN EACH NOSTRIL DAILY X 1 WEEK, THEN 1-2 SPRAYS DAILY. USE LOWEST POSSIBLE DOSE AFTER 1 WEEK   guaiFENesin (MUCINEX) 600 MG 12 hr tablet Take 600 mg by mouth daily as needed.   loratadine (CLARITIN) 10 MG tablet TAKE 1 TABLET BY MOUTH EVERY DAY   Multiple Vitamin (MULTIVITAMIN) tablet Take 1 tablet by mouth daily.   polyethylene glycol powder (GLYCOLAX/MIRALAX) 17 GM/SCOOP powder Take 17 g by mouth daily. Hold for loose stool   PREMARIN vaginal cream INSERT 1 GRAM VAGINALLY THREE TIMES A WEEKLY, THEN ONCE WEEKLY   Probiotic Product (ALIGN) 4 MG CAPS Take 1 capsule by mouth as needed.   XIIDRA 5 % SOLN Apply 1 drop to eye 2 (two) times daily.   saccharomyces boulardii (FLORASTOR) 250 MG capsule Take 1 capsule (250 mg total) by mouth 2 (two) times daily for 10 days.   No facility-administered encounter medications on file as of 08/21/2022.     SIGNIFICANT DIAGNOSTIC EXAMS  Review of Systems  Constitutional:  Negative for malaise/fatigue.  Respiratory:  Negative for cough and shortness of breath.   Cardiovascular:  Negative for chest pain, palpitations and leg swelling.  Gastrointestinal:  Negative for abdominal pain, constipation and heartburn.   Genitourinary:  Positive for frequency. Negative for dysuria and urgency.  Musculoskeletal:  Negative for back pain, joint pain and myalgias.  Skin: Negative.   Neurological:  Negative for dizziness.  Psychiatric/Behavioral:  The patient is not nervous/anxious.    Physical Exam Constitutional:      General: She is not in acute distress.    Appearance: She is well-developed. She is not diaphoretic.  Neck:     Thyroid: No thyromegaly.  Cardiovascular:     Rate and Rhythm: Normal rate and regular rhythm.     Pulses: Normal pulses.     Heart sounds: Normal heart sounds.  Pulmonary:     Effort: Pulmonary effort is normal. No respiratory distress.     Breath sounds: Normal breath sounds.  Abdominal:     General: Bowel sounds are normal. There is no distension.     Palpations: Abdomen  is soft.     Tenderness: There is abdominal tenderness.     Comments: Some mild tenderness to suprapubic area on the left side.   Musculoskeletal:        General: Normal range of motion.     Cervical back: Neck supple.     Right lower leg: No edema.     Left lower leg: No edema.  Lymphadenopathy:     Cervical: No cervical adenopathy.  Skin:    General: Skin is warm and dry.  Neurological:     Mental Status: She is alert. Mental status is at baseline.  Psychiatric:        Mood and Affect: Mood normal.       ASSESSMENT/ PLAN:  TODAY  E-coli UTI: at this time will place cipro on her allergy list. Will dip urine for further bacteria.  Constipation: will use miralax as needed up to twice daily    Synthia Innocent NP [facility]  call [phone]

## 2022-08-22 LAB — URINE CULTURE
MICRO NUMBER:: 14990825
Result:: NO GROWTH
SPECIMEN QUALITY:: ADEQUATE

## 2022-08-28 DIAGNOSIS — I739 Peripheral vascular disease, unspecified: Secondary | ICD-10-CM | POA: Diagnosis not present

## 2022-09-05 ENCOUNTER — Other Ambulatory Visit: Payer: Self-pay | Admitting: Family

## 2022-09-05 DIAGNOSIS — I480 Paroxysmal atrial fibrillation: Secondary | ICD-10-CM

## 2022-09-05 NOTE — Telephone Encounter (Signed)
EliQuis ordered 3 weeks ago by cardiologist.please contact Cardiologist office.

## 2022-09-05 NOTE — Telephone Encounter (Signed)
Pharmacy requested refill.  ?Pended Rx and sent to Dinah for approval.  ?

## 2022-10-09 DIAGNOSIS — H52203 Unspecified astigmatism, bilateral: Secondary | ICD-10-CM | POA: Diagnosis not present

## 2022-10-09 DIAGNOSIS — Z961 Presence of intraocular lens: Secondary | ICD-10-CM | POA: Diagnosis not present

## 2022-10-09 DIAGNOSIS — H353132 Nonexudative age-related macular degeneration, bilateral, intermediate dry stage: Secondary | ICD-10-CM | POA: Diagnosis not present

## 2022-10-15 ENCOUNTER — Encounter: Payer: Self-pay | Admitting: Physician Assistant

## 2022-10-15 NOTE — Progress Notes (Unsigned)
Cardiology Office Note    Date:  10/16/2022   ID:  Ashley Lawson, DOB [birth year], MRN [id number]  PCP:  Caesar Bookman, NP  Cardiologist:  Verne Carrow, MD  Electrophysiologist:  None   Chief Complaint: f/u afib  History of Present Illness:   Ashley Lawson is a 89 y.o. female with history of longstanding persistent atrial fibrillation, allergic rhinitis, compression fx, DDD, diverticulosis, GERD, hepatic steatosis, IBS, HLD followed by PCP, internal hemorrhoids, PMR, trivial MR, mild-moderate TR, vasovagal syncope who is seen for follow-up.  Her afib was diagnosed in 2019 after she was recovering from several medical issues, perceeived as chest pressure/wobbliness. She was started on beta blocker and Eliquis and converted spontaneously to NSR. She also had issues with hyponatremia. Metoprolol was sitched to diltiazem given fatigue and Raynaud's. She was admitted 2021 with syncope in the setting of severe abdominal pain during a bowel movement, felt vasovagal. Telemetry showed transient rate controlled atrial fibrillation. Echo 05/2019 showed EF 65-70%, moderate LAE, severe RAE, trivial MR, mild-mod TR. Cardiac monitor showed atrial fib but no pauses. She has subsequently been managed with rate control strategy and anticoagulation.  She is accompanied by her daughter and reports she is doing well. She has had 2 prior nosebleeds spaced about a year and a half apart but none recently. She now follows with ENT for TMJ. Denies any CP, SOB, palpitations or syncope. Daughter reports they've noticed some memory decline.   Labwork independently reviewed: 07/2022 LDL 95, trig 103, TSH wnl, Cr 0.83, K 4.7, LFTs ok, CBC wnl - PCP   Past History   Past Medical History:  Diagnosis Date   Abdominal pain 12/2009   hospitalized   Allergic rhinitis    Anxiety    Bladder problem    Chronic constipation    Compression fx, lumbar spine (HCC)    DDD (degenerative disc disease), lumbar     Diverticulosis of colon    Dog bite(E906.0) 08/30/2011   GERD (gastroesophageal reflux disease)    Hepatic steatosis    pt denies   Hyperlipidemia    Hyperplastic colon polyp    IBS (irritable bowel syndrome)    constipation predominant   Internal hemorrhoids    Longstanding persistent atrial fibrillation (HCC)    Mitral regurgitation    Osteoporosis    dexa 2011 -2.7 hip nl spine   PMR (polymyalgia rheumatica) (HCC) 08/30/2011   under rheum care  on low dose pred 5 mg    Renal disorder    Syncope    Tricuspid regurgitation     Past Surgical History:  Procedure Laterality Date   ABDOMINAL HYSTERECTOMY     with bladder tact and rectocele repair   BLADDER SURGERY     Streched   CATARACT EXTRACTION  2010   both   COLONOSCOPY  2016 was most recent   CYSTO WITH HYDRODISTENSION N/A 10/05/2019   Procedure: CYSTOSCOPY/HYDRODISTENSION;  Surgeon: Alfredo Martinez, MD;  Location: [facility];  Service: Urology;  Laterality: N/A;   TONSILLECTOMY AND ADENOIDECTOMY      Current Medications: Current Meds  Medication Sig   acetaminophen (TYLENOL) 500 MG tablet Take 1 tablet (500 mg total) by mouth in the morning and at bedtime.   Ascorbic Acid (VITAMIN C) 100 MG tablet Take 500 mg by mouth daily.   calcium carbonate (OS-CAL) 1250 (500 Ca) MG chewable tablet Chew 1 tablet by mouth daily.    diltiazem (CARDIZEM CD) 120 MG 24 hr capsule TAKE 1 CAPSULE  BY MOUTH EVERY DAY   ELIQUIS 5 MG TABS tablet TAKE 1 TABLET BY MOUTH TWICE A DAY   escitalopram (LEXAPRO) 5 MG tablet TAKE 1 TABLET BY MOUTH EVERYDAY AT BEDTIME   fluticasone (FLONASE) 50 MCG/ACT nasal spray INHALE 2 SPRAYS IN EACH NOSTRIL DAILY X 1 WEEK, THEN 1-2 SPRAYS DAILY. USE LOWEST POSSIBLE DOSE AFTER 1 WEEK   guaiFENesin (MUCINEX) 600 MG 12 hr tablet Take 600 mg by mouth daily as needed.   loratadine (CLARITIN) 10 MG tablet TAKE 1 TABLET BY MOUTH EVERY DAY   Multiple Vitamin (MULTIVITAMIN) tablet Take 1 tablet by mouth daily.    polyethylene glycol powder (GLYCOLAX/MIRALAX) 17 GM/SCOOP powder Take 17 g by mouth daily. Hold for loose stool   PREMARIN vaginal cream INSERT 1 GRAM VAGINALLY THREE TIMES A WEEKLY, THEN ONCE WEEKLY   Probiotic Product (ALIGN) 4 MG CAPS Take 1 capsule by mouth as needed.   XIIDRA 5 % SOLN Apply 1 drop to eye 2 (two) times daily.      Allergies:   Alendronate sodium, Penicillins, Ciprofloxacin, Amoxicillin, and Metronidazole   Social History   Socioeconomic History   Marital status: Widowed    Spouse name: Not on file   Number of children: Not on file   Years of education: Not on file   Highest education level: Not on file  Occupational History   Occupation: retired  Tobacco Use   Smoking status: Never   Smokeless tobacco: Never  Vaping Use   Vaping status: Never Used  Substance and Sexual Activity   Alcohol use: Yes    Alcohol/week: 0.0 standard drinks of alcohol    Comment: occ   Drug use: Never   Sexual activity: Not Currently  Other Topics Concern   Not on file  Social History Narrative   Retired   Regular exercise- yes   Widowed   At home with children and GKs    HH of 2     Puppy   Plays cards is social and active       Diet:  No      Do you drink/ eat things with caffeine? Yes      Marital status:   Widow                            What year were you married ? [id number]      Do you live in a house, apartment,assistred living, condo, trailer, etc.)? Townhouse      Is it one or more stories? 2      How many persons live in your home ? 2      Do you have any pets in your home ?(please list)  None      Highest Level of education completed:  12 th Grade      Current or past profession: Retail Sales @ [employer]      Do you exercise?    Some                          Type & how often  Walking      ADVANCED DIRECTIVES (Please bring copies)      Do you have a living will? Yes      Do you have a DNR form?  Yes                     If not,  do you want to discuss  one?       Do you have signed POA?HPOA forms? Yes                If so, please bring to your appointment      FUNCTIONAL STATUS- To be completed by Spouse / child / Staff       Do you have difficulty bathing or dressing yourself ? No      Do you have difficulty preparing food or eating ?  No      Do you have difficulty managing your mediation ? No      Do you have difficulty managing your finances ? No      Do you have difficulty affording your medication ? No      Social Determinants of Corporate investment banker Strain: Not on file  Food Insecurity: Not on file  Transportation Needs: Not on file  Physical Activity: Not on file  Stress: Not on file  Social Connections: Not on file     Family History:  The patient's family history includes Alzheimer's disease (age of onset: 15) in her sister; Bladder Cancer (age of onset: 66) in her sister; Crohn's disease in her son; Heart attack (age of onset: 58) in her father; Liver disease (age of onset: 47) in her brother; Stroke (age of onset: 21) in her mother. There is no history of Colon cancer, Esophageal cancer, Stomach cancer, Rectal cancer, or Liver cancer.  ROS:   Please see the history of present illness. All other systems are reviewed and otherwise negative.    EKG(s)/Additional Testing   EKG:  EKG is ordered today, personally reviewed, demonstrating atrial fib 72bpm no acute STT changes  CV Studies: Cardiac studies reviewed are outlined and summarized above. Otherwise please see EMR for full report.  Recent Labs: 08/12/2022: ALT 13; BUN 10; Creat 0.83; Hemoglobin 12.9; Platelets 233; Potassium 4.7; Sodium 139; TSH 4.19  Recent Lipid Panel    Component Value Date/Time   CHOL 177 08/12/2022 1050   TRIG 103 08/12/2022 1050   HDL 62 08/12/2022 1050   CHOLHDL 2.9 08/12/2022 1050   VLDL 40.6 (H) 04/07/2019 1552   LDLCALC 95 08/12/2022 1050   LDLDIRECT 100.0 04/07/2019 1552    PHYSICAL EXAM:    VS:  BP (!) 122/58    Pulse 72   Ht 5\' 2"  (1.575 m)   Wt 161 lb (73 kg)   SpO2 94%   BMI 29.45 kg/m   BMI: Body mass index is 29.45 kg/m.  GEN: Well nourished, well developed female in no acute distress HEENT: normocephalic, atraumatic Neck: no JVD, carotid bruits, or masses Cardiac: irregularly irregular; no murmurs, rubs, or gallops, no edema  Respiratory:  clear to auscultation bilaterally, normal work of breathing GI: soft, nontender, nondistended, + BS MS: no deformity or atrophy Skin: warm and dry, no rash Neuro:  Alert and Oriented x 3, Strength and sensation are intact, follows commands Psych: euthymic mood, full affect  Wt Readings from Last 3 Encounters:  10/16/22 161 lb (73 kg)  08/21/22 164 lb (74.4 kg)  08/14/22 163 lb (73.9 kg)     ASSESSMENT & PLAN:   1. Longstanding persistent atrial fibrillation - suspect permanent at this juncture. Continue rate control strategy with diltiazem and anticoagulation with Eliquis. Dose remains appropriate for age, weight Cr. Discussed getting labs q3months with PCP since she otherwise only needs to see [place] yearly. Next CBC/BMET will be due in November.  They already follow very closely with a geriatrician and see them very frequently. They report cost is not actively a concern for Eliquis - she's in the donut hole for a brief period of time later in the year but it is not cost prohibitive and she plans to continue.  2. Trivial MR, mild-moderate TR - discussed finding with patient/daughter. Given age and absence of symptoms, would favor conservative approach. Consider repeat echocardiogram if new cardiac symptoms arise.  3. Vasovagal syncope - quiescent, no recurrence.  4. Hyperlipidemia - reviewed recent labs, followed by PCP. No changes made today.    Disposition: F/u with Dr. Clifton James or me in 1 year.  Medication Adjustments/Labs and Tests Ordered: Current medicines are reviewed at length with the patient today.  Concerns regarding medicines are  outlined above. Medication changes, Labs and Tests ordered today are summarized above and listed in the Patient Instructions accessible in Encounters.   Signed, Laurann Montana, PA-C  10/16/2022 11:44 AM    [facility] Phone: [phone]; Fax: [phone]

## 2022-10-16 ENCOUNTER — Ambulatory Visit: Payer: Medicare Other | Attending: Physician Assistant | Admitting: Physician Assistant

## 2022-10-16 ENCOUNTER — Encounter: Payer: Self-pay | Admitting: Physician Assistant

## 2022-10-16 VITALS — BP 122/58 | HR 72 | Ht 62.0 in | Wt 161.0 lb

## 2022-10-16 DIAGNOSIS — I071 Rheumatic tricuspid insufficiency: Secondary | ICD-10-CM

## 2022-10-16 DIAGNOSIS — I340 Nonrheumatic mitral (valve) insufficiency: Secondary | ICD-10-CM

## 2022-10-16 DIAGNOSIS — I4811 Longstanding persistent atrial fibrillation: Secondary | ICD-10-CM

## 2022-10-16 DIAGNOSIS — E785 Hyperlipidemia, unspecified: Secondary | ICD-10-CM | POA: Diagnosis not present

## 2022-10-16 DIAGNOSIS — R55 Syncope and collapse: Secondary | ICD-10-CM

## 2022-10-16 NOTE — Patient Instructions (Signed)
Medication Instructions:  Your physician recommends that you continue on your current medications as directed. Please refer to the Current Medication list given to you today. *If you need a refill on your cardiac medications before your next appointment, please call your pharmacy*   Lab Work: NONE ORDERED  Testing/Procedures: NONE ORDERED   Follow-Up: At [facility], you and your health needs are our priority.  As part of our continuing mission to provide you with exceptional heart care, we have created designated Provider Care Teams.  These Care Teams include your primary Cardiologist (physician) and Advanced Practice Providers (APPs -  Physician Assistants and Nurse Practitioners) who all work together to provide you with the care you need, when you need it.  We recommend signing up for the patient portal called "MyChart".  Sign up information is provided on this After Visit Summary.  MyChart is used to connect with patients for Virtual Visits (Telemedicine).  Patients are able to view lab/test results, encounter notes, upcoming appointments, etc.  Non-urgent messages can be sent to your provider as well.   To learn more about what you can do with MyChart, go to [email or web address].    Your next appointment:   12 month(s)  Provider:   Verne Carrow, MD  or Ronie Spies, PA-C   Other Instructions

## 2022-11-03 DIAGNOSIS — Z03818 Encounter for observation for suspected exposure to other biological agents ruled out: Secondary | ICD-10-CM | POA: Diagnosis not present

## 2022-11-03 DIAGNOSIS — R051 Acute cough: Secondary | ICD-10-CM | POA: Diagnosis not present

## 2022-11-03 DIAGNOSIS — R509 Fever, unspecified: Secondary | ICD-10-CM | POA: Diagnosis not present

## 2022-11-03 DIAGNOSIS — R0981 Nasal congestion: Secondary | ICD-10-CM | POA: Diagnosis not present

## 2022-11-13 ENCOUNTER — Ambulatory Visit: Payer: Medicare Other | Admitting: Sports Medicine

## 2022-11-13 ENCOUNTER — Encounter: Payer: Self-pay | Admitting: Sports Medicine

## 2022-11-13 VITALS — BP 128/82 | HR 71 | Temp 97.5°F | Ht 62.0 in | Wt 161.0 lb

## 2022-11-13 DIAGNOSIS — J0100 Acute maxillary sinusitis, unspecified: Secondary | ICD-10-CM

## 2022-11-13 DIAGNOSIS — L989 Disorder of the skin and subcutaneous tissue, unspecified: Secondary | ICD-10-CM

## 2022-11-13 MED ORDER — AZITHROMYCIN 250 MG PO TABS
ORAL_TABLET | ORAL | 0 refills | Status: AC
Start: 2022-11-13 — End: 2022-11-18

## 2022-11-13 MED ORDER — BENZONATATE 200 MG PO CAPS
200.0000 mg | ORAL_CAPSULE | Freq: Two times a day (BID) | ORAL | 0 refills | Status: DC | PRN
Start: 2022-11-13 — End: 2023-02-27

## 2022-11-13 NOTE — Progress Notes (Deleted)
Just saw urgent clinic and prescribed allergic medication 10 days ago And gave her nose spray  Trikly sensation  URI  Runny nose no Cough- yes Phlegm dry Sore throat- no Sinus congestion- yes Sinus pain- rt  Myalgias- no Sick contacts- Headache- no Medications tried -  Denies fevers No change in her appetite Denies feeling dizzy or lightehaded    Tries to clear  her throat

## 2022-11-13 NOTE — Progress Notes (Signed)
Careteam: Patient Care Team: Ngetich, Donalee Citrin, NP as PCP - General (Family Medicine) Kathleene Hazel, MD as PCP - Cardiology (Cardiology) Alfredo Martinez, MD (Urology) Alanson Puls, MD (Dermatology) Maris Berger, MD (Ophthalmology) Lunette Stands, MD (Orthopedic Surgery) Dareen Piano  (Rheumatology) Donzetta Starch, MD as Consulting Physician (Dermatology) Iva Boop, MD as Consulting Physician (Gastroenterology) Drema Halon, MD (Inactive) as Consulting Physician (Otolaryngology)  PLACE OF SERVICE:  [facility]  Advanced Directive information    Allergies  Allergen Reactions   Alendronate Sodium     upset stomach   Penicillins     Has patient had a PCN reaction causing immediate rash, facial/tongue/throat swelling, SOB or lightheadedness with hypotension: Yes Has patient had a PCN reaction causing severe rash involving mucus membranes or skin necrosis: No Has patient had a PCN reaction that required hospitalization: No Has patient had a PCN reaction occurring within the last 10 years: No If all of the above answers are "NO", then may proceed with Cephalosporin use.    Ciprofloxacin     Had headaches; swelling of feet.    Amoxicillin Rash    Has patient had a PCN reaction causing immediate rash, facial/tongue/throat swelling, SOB or lightheadedness with hypotension: yes Has patient had a PCN reaction causing severe rash involving mucus membranes or skin necrosis: no Has patient had a PCN reaction that required hospitalization: no Has patient had PCN reaction within the last 10 years: no  If all of the above answers are "NO", then may proceed with Cephalosporin use.  REACTION: unspecified   Metronidazole Other (See Comments)    Pounding headaches patient says was bad.  With cipro for diverticulitis rx. Other reaction(s): Other (See Comments) Pounding headaches patient says was bad.  With cipro for diverticulitis rx.    Chief Complaint  Patient  presents with   Acute Visit    Allergies, constipation, anxious at night (about once weekly), and right leg concerns. Here with daughter Clydie Braun      HPI: Patient is a 89 y.o. female with past medical history of atrial fibrillation, allergic rhinitis, GERD, CKD presents to clinic complaining of cough and congestion. She is Accompanied by her daughter. Patient reports that she is having nasal congestion with postnasal drip, dry cough since 2 weeks, she went to urgent care 10 days ago and they prescribed her a nasal spray.  She used  nasal spray with no improvement. Patient denies fevers, chills, myalgias, sore throat, shortness of breath, headaches, ear pain, dizzy or lightheadedness. She feels tickly sensation in her throat and constantly trying to clear her throat. They are planning to visit with her great grand son and wanted to  make sure she is ok visiting. Patient is afebrile, vital stable.  Scab on her right leg-daughter states that patient had a scab on her right leg since 2 weeks with slight redness around the lesion.  Denies drainage from the wound.  No recent trauma.  Review of Systems:  Review of Systems  Constitutional:  Negative for chills and fever.  HENT:  Positive for congestion. Negative for ear discharge, ear pain and sore throat.   Eyes:  Negative for double vision.  Respiratory:  Negative for cough, sputum production and shortness of breath.   Cardiovascular:  Negative for chest pain, palpitations and leg swelling.  Gastrointestinal:  Negative for abdominal pain, heartburn and nausea.  Genitourinary:  Negative for dysuria, frequency and hematuria.  Musculoskeletal:  Negative for falls and myalgias.  Neurological:  Negative for  dizziness, sensory change and focal weakness.    Past Medical History:  Diagnosis Date   Abdominal pain 12/2009   hospitalized   Allergic rhinitis    Anxiety    Bladder problem    Chronic constipation    Compression fx, lumbar spine (HCC)     DDD (degenerative disc disease), lumbar    Diverticulosis of colon    Dog bite(E906.0) 08/30/2011   GERD (gastroesophageal reflux disease)    Hepatic steatosis    pt denies   Hyperlipidemia    Hyperplastic colon polyp    IBS (irritable bowel syndrome)    constipation predominant   Internal hemorrhoids    Longstanding persistent atrial fibrillation (HCC)    Mitral regurgitation    Osteoporosis    dexa 2011 -2.7 hip nl spine   PMR (polymyalgia rheumatica) (HCC) 08/30/2011   under rheum care  on low dose pred 5 mg    Renal disorder    Syncope    Tricuspid regurgitation    Past Surgical History:  Procedure Laterality Date   ABDOMINAL HYSTERECTOMY     with bladder tact and rectocele repair   BLADDER SURGERY     Streched   CATARACT EXTRACTION  2010   both   COLONOSCOPY  2016 was most recent   CYSTO WITH HYDRODISTENSION N/A 10/05/2019   Procedure: CYSTOSCOPY/HYDRODISTENSION;  Surgeon: Alfredo Martinez, MD;  Location: [facility];  Service: Urology;  Laterality: N/A;   TONSILLECTOMY AND ADENOIDECTOMY     Social History:   reports that she has never smoked. She has never used smokeless tobacco. She reports current alcohol use. She reports that she does not use drugs.  Family History  Problem Relation Age of Onset   Stroke Mother 47   Liver disease Brother 25   Heart attack Father 78   Alzheimer's disease Sister 28   Bladder Cancer Sister 81   Crohn's disease Son    Colon cancer Neg Hx    Esophageal cancer Neg Hx    Stomach cancer Neg Hx    Rectal cancer Neg Hx    Liver cancer Neg Hx     Medications: Patient's Medications  New Prescriptions   AZITHROMYCIN (ZITHROMAX) 250 MG TABLET    Take 2 tablets on day 1, then 1 tablet daily on days 2 through 5   BENZONATATE (TESSALON) 200 MG CAPSULE    Take 1 capsule (200 mg total) by mouth 2 (two) times daily as needed for cough.  Previous Medications   ACETAMINOPHEN (TYLENOL) 500 MG TABLET    Take 500 mg by mouth at bedtime.    ASCORBIC ACID (VITAMIN C) 100 MG TABLET    Take 500 mg by mouth daily.   CALCIUM CARBONATE (OS-CAL) 1250 (500 CA) MG CHEWABLE TABLET    Chew 1 tablet by mouth daily.    DILTIAZEM (CARDIZEM CD) 120 MG 24 HR CAPSULE    TAKE 1 CAPSULE BY MOUTH EVERY DAY   ELIQUIS 5 MG TABS TABLET    TAKE 1 TABLET BY MOUTH TWICE A DAY   ESCITALOPRAM (LEXAPRO) 5 MG TABLET    TAKE 1 TABLET BY MOUTH EVERYDAY AT BEDTIME   FLUTICASONE (FLONASE) 50 MCG/ACT NASAL SPRAY    INHALE 2 SPRAYS IN EACH NOSTRIL DAILY X 1 WEEK, THEN 1-2 SPRAYS DAILY. USE LOWEST POSSIBLE DOSE AFTER 1 WEEK   GUAIFENESIN (MUCINEX) 600 MG 12 HR TABLET    Take 600 mg by mouth daily as needed.   LORATADINE (CLARITIN) 10 MG TABLET  TAKE 1 TABLET BY MOUTH EVERY DAY   MULTIPLE VITAMIN (MULTIVITAMIN) TABLET    Take 1 tablet by mouth daily.   POLYETHYLENE GLYCOL POWDER (GLYCOLAX/MIRALAX) 17 GM/SCOOP POWDER    Take 17 g by mouth daily. Hold for loose stool   PREMARIN VAGINAL CREAM    INSERT 1 GRAM VAGINALLY THREE TIMES A WEEKLY, THEN ONCE WEEKLY   PROBIOTIC PRODUCT (ALIGN) 4 MG CAPS    Take 1 capsule by mouth as needed.   XIIDRA 5 % SOLN    Apply 1 drop to eye 2 (two) times daily.  Modified Medications   No medications on file  Discontinued Medications   ACETAMINOPHEN (TYLENOL) 500 MG TABLET    Take 1 tablet (500 mg total) by mouth in the morning and at bedtime.    Physical Exam:  Vitals:   11/13/22 1455  BP: 128/82  Pulse: 71  Temp: (!) 97.5 F (36.4 C)  TempSrc: Temporal  SpO2: 98%  Weight: 161 lb (73 kg)  Height: 5\' 2"  (1.575 m)   Body mass index is 29.45 kg/m. Wt Readings from Last 3 Encounters:  11/13/22 161 lb (73 kg)  10/16/22 161 lb (73 kg)  08/21/22 164 lb (74.4 kg)    Physical Exam Constitutional:      Appearance: Normal appearance.  HENT:     Head: Normocephalic and atraumatic.     Mouth/Throat:     Pharynx: Posterior oropharyngeal erythema (Minimal erythema, no exudates) present.     Comments: Right maxillary sinus  tenderness Cardiovascular:     Rate and Rhythm: Normal rate and regular rhythm.     Heart sounds: No murmur heard. Pulmonary:     Effort: Pulmonary effort is normal. No respiratory distress.     Breath sounds: Normal breath sounds. No wheezing.  Abdominal:     General: Bowel sounds are normal. There is no distension.     Tenderness: There is no abdominal tenderness. There is no guarding or rebound.  Musculoskeletal:        General: No swelling or tenderness.  Skin:    General: Skin is dry.  Neurological:     Mental Status: She is alert. Mental status is at baseline.     Sensory: No sensory deficit.     Motor: No weakness.     Labs reviewed: Basic Metabolic Panel: Recent Labs    03/22/22 1338 08/12/22 1050  NA 128* 139  K 3.7 4.7  CL 92* 102  CO2 24 26  GLUCOSE 96 92  BUN 7 10  CREATININE 0.91 0.83  CALCIUM 9.0 9.7  TSH  --  4.19   Liver Function Tests: Recent Labs    08/12/22 1050  AST 20  ALT 13  BILITOT 0.8  PROT 6.8   No results for input(s): "LIPASE", "AMYLASE" in the last 8760 hours. No results for input(s): "AMMONIA" in the last 8760 hours. CBC: Recent Labs    08/12/22 1050  WBC 5.1  NEUTROABS 2,927  HGB 12.9  HCT 38.3  MCV 94.3  PLT 233   Lipid Panel: Recent Labs    08/12/22 1050  CHOL 177  HDL 62  LDLCALC 95  TRIG 103  CHOLHDL 2.9   TSH: Recent Labs    08/12/22 1050  TSH 4.19   A1C: No results found for: "HGBA1C"   Assessment/Plan There are no diagnoses linked to this encounter.   1. Acute non-recurrent maxillary sinusitis Rt max sinus tenderness Pt allergic to pnc Daughter states zpak works better -  azithromycin (ZITHROMAX) 250 MG tablet; Take 2 tablets on day 1, then 1 tablet daily on days 2 through 5  Dispense: 6 tablet; Refill: 0 - benzonatate (TESSALON) 200 MG capsule; Take 1 capsule (200 mg total) by mouth 2 (two) times daily as needed for cough.  Dispense: 20 capsule; Refill: 0  2. Skin lesion Scab on the rt leg   Minimal redness Instructed to monitor and if does not heal in a month needs to see dermatology  I spent 30  minutes for the care of this patient in face to face time, chart review, clinical documentation, patient education.

## 2022-11-22 ENCOUNTER — Ambulatory Visit (INDEPENDENT_AMBULATORY_CARE_PROVIDER_SITE_OTHER): Payer: Medicare Other | Admitting: Family

## 2022-11-22 ENCOUNTER — Encounter: Payer: Self-pay | Admitting: Family

## 2022-11-22 VITALS — BP 124/88 | HR 67 | Temp 97.1°F | Resp 18 | Ht 62.0 in | Wt 162.2 lb

## 2022-11-22 DIAGNOSIS — R102 Pelvic and perineal pain: Secondary | ICD-10-CM | POA: Diagnosis not present

## 2022-11-22 DIAGNOSIS — F411 Generalized anxiety disorder: Secondary | ICD-10-CM

## 2022-11-22 LAB — POCT URINALYSIS DIPSTICK
Bilirubin, UA: NEGATIVE
Glucose, UA: NEGATIVE
Ketones, UA: POSITIVE
Nitrite, UA: POSITIVE
Protein, UA: POSITIVE — AB
Spec Grav, UA: <=1.005 — AB (ref 1.010–1.025)
Urobilinogen, UA: NEGATIVE E.U./dL — AB
pH, UA: 7.5 (ref 5.0–8.0)

## 2022-11-22 MED ORDER — ESCITALOPRAM OXALATE 10 MG PO TABS
10.0000 mg | ORAL_TABLET | Freq: Every day | ORAL | 1 refills | Status: DC
Start: 2022-11-22 — End: 2023-05-22

## 2022-11-22 MED ORDER — NITROFURANTOIN MONOHYD MACRO 100 MG PO CAPS: 100.0000 mg | ORAL_CAPSULE | Freq: Two times a day (BID) | ORAL | 0 refills | Status: AC

## 2022-11-22 NOTE — Progress Notes (Signed)
Provider: Richarda Blade FNP-C  Caytlin Better, Donalee Citrin, NP  Patient Care Team: Honora Searson, Donalee Citrin, NP as PCP - General (Family Medicine) Kathleene Hazel, MD as PCP - Cardiology (Cardiology) Alfredo Martinez, MD (Urology) Alanson Puls, MD (Dermatology) Maris Berger, MD (Ophthalmology) Lunette Stands, MD (Orthopedic Surgery) Dareen Piano  (Rheumatology) Donzetta Starch, MD as Consulting Physician (Dermatology) Iva Boop, MD as Consulting Physician (Gastroenterology) Drema Halon, MD (Inactive) as Consulting Physician (Otolaryngology)  Extended Emergency Contact Information Primary Emergency Contact: Apple,Karen Address: [address]          [place] [address] [place] Home Phone: [phone] Work Phone: [phone] Mobile Phone: [phone] Relation: Daughter  Code Status:  Full Code  Goals of care: Advanced Directive information    08/21/2022    8:56 AM  Advanced Directives  Does Patient Have a Medical Advance Directive? Yes  Type of Estate agent of [place];Living will;Out of facility DNR (pink MOST or yellow form)  Does patient want to make changes to medical advance directive? No - Patient declined     Chief Complaint  Patient presents with   Acute Visit    need to have urine checked for possible UTI     HPI:  Pt is a 89 y.o. female seen today for an acute visit for evaluation of supra pubic pain.states had travel to visit with her granddaughter one hour away upon arrival had lower abdominal pain.  She denies any fever,chills,nausea,vomiting,flank pain,urgency,frequency,dysuria,difficult urination or hematuria. Daughter states patient has been more anxious at night and irritable.Tends to sleep late and wake around 10 -10:30 am.   Past Medical History:  Diagnosis Date   Abdominal pain 12/2009   hospitalized   Allergic rhinitis    Anxiety    Bladder problem    Chronic constipation    Compression fx,  lumbar spine (HCC)    DDD (degenerative disc disease), lumbar    Diverticulosis of colon    Dog bite(E906.0) 08/30/2011   GERD (gastroesophageal reflux disease)    Hepatic steatosis    pt denies   Hyperlipidemia    Hyperplastic colon polyp    IBS (irritable bowel syndrome)    constipation predominant   Internal hemorrhoids    Longstanding persistent atrial fibrillation (HCC)    Mitral regurgitation    Osteoporosis    dexa 2011 -2.7 hip nl spine   PMR (polymyalgia rheumatica) (HCC) 08/30/2011   under rheum care  on low dose pred 5 mg    Renal disorder    Syncope    Tricuspid regurgitation    Past Surgical History:  Procedure Laterality Date   ABDOMINAL HYSTERECTOMY     with bladder tact and rectocele repair   BLADDER SURGERY     Streched   CATARACT EXTRACTION  2010   both   COLONOSCOPY  2016 was most recent   CYSTO WITH HYDRODISTENSION N/A 10/05/2019   Procedure: CYSTOSCOPY/HYDRODISTENSION;  Surgeon: Alfredo Martinez, MD;  Location: [facility];  Service: Urology;  Laterality: N/A;   TONSILLECTOMY AND ADENOIDECTOMY      Allergies  Allergen Reactions   Alendronate Sodium     upset stomach   Penicillins     Has patient had a PCN reaction causing immediate rash, facial/tongue/throat swelling, SOB or lightheadedness with hypotension: Yes Has patient had a PCN reaction causing severe rash involving mucus membranes or skin necrosis: No Has patient had a PCN reaction that required hospitalization: No Has patient had a PCN reaction occurring within the last  10 years: No If all of the above answers are "NO", then may proceed with Cephalosporin use.    Ciprofloxacin     Had headaches; swelling of feet.    Amoxicillin Rash    Has patient had a PCN reaction causing immediate rash, facial/tongue/throat swelling, SOB or lightheadedness with hypotension: yes Has patient had a PCN reaction causing severe rash involving mucus membranes or skin necrosis: no Has patient had a PCN reaction  that required hospitalization: no Has patient had PCN reaction within the last 10 years: no  If all of the above answers are "NO", then may proceed with Cephalosporin use.  REACTION: unspecified   Metronidazole Other (See Comments)    Pounding headaches patient says was bad.  With cipro for diverticulitis rx. Other reaction(s): Other (See Comments) Pounding headaches patient says was bad.  With cipro for diverticulitis rx.    Outpatient Encounter Medications as of 11/22/2022  Medication Sig   acetaminophen (TYLENOL) 500 MG tablet Take 500 mg by mouth at bedtime.   Ascorbic Acid (VITAMIN C) 100 MG tablet Take 500 mg by mouth daily.   benzonatate (TESSALON) 200 MG capsule Take 1 capsule (200 mg total) by mouth 2 (two) times daily as needed for cough.   calcium carbonate (OS-CAL) 1250 (500 Ca) MG chewable tablet Chew 1 tablet by mouth daily.    diltiazem (CARDIZEM CD) 120 MG 24 hr capsule TAKE 1 CAPSULE BY MOUTH EVERY DAY   ELIQUIS 5 MG TABS tablet TAKE 1 TABLET BY MOUTH TWICE A DAY   escitalopram (LEXAPRO) 5 MG tablet TAKE 1 TABLET BY MOUTH EVERYDAY AT BEDTIME   fluticasone (FLONASE) 50 MCG/ACT nasal spray INHALE 2 SPRAYS IN EACH NOSTRIL DAILY X 1 WEEK, THEN 1-2 SPRAYS DAILY. USE LOWEST POSSIBLE DOSE AFTER 1 WEEK   guaiFENesin (MUCINEX) 600 MG 12 hr tablet Take 600 mg by mouth daily as needed.   loratadine (CLARITIN) 10 MG tablet TAKE 1 TABLET BY MOUTH EVERY DAY   Multiple Vitamin (MULTIVITAMIN) tablet Take 1 tablet by mouth daily.   polyethylene glycol powder (GLYCOLAX/MIRALAX) 17 GM/SCOOP powder Take 17 g by mouth daily. Hold for loose stool   PREMARIN vaginal cream INSERT 1 GRAM VAGINALLY THREE TIMES A WEEKLY, THEN ONCE WEEKLY   Probiotic Product (ALIGN) 4 MG CAPS Take 1 capsule by mouth as needed.   XIIDRA 5 % SOLN Apply 1 drop to eye 2 (two) times daily.   No facility-administered encounter medications on file as of 11/22/2022.    Review of Systems  Constitutional:  Negative for  appetite change, chills, fatigue, fever and unexpected weight change.  Eyes:  Negative for pain, discharge, redness, itching and visual disturbance.  Respiratory:  Negative for cough, chest tightness, shortness of breath and wheezing.   Cardiovascular:  Negative for chest pain, palpitations and leg swelling.  Gastrointestinal:  Positive for abdominal pain. Negative for abdominal distention, blood in stool, constipation, diarrhea, nausea and vomiting.       Lower abdominal pain   Genitourinary:  Positive for pelvic pain. Negative for difficulty urinating, dysuria, flank pain, frequency and urgency.  Musculoskeletal:  Negative for arthralgias, back pain, gait problem, joint swelling, myalgias, neck pain and neck stiffness.  Skin:  Negative for color change, pallor, rash and wound.  Neurological:  Negative for dizziness, weakness, light-headedness and headaches.  Psychiatric/Behavioral:  Negative for agitation, behavioral problems, confusion and hallucinations. The patient is nervous/anxious.     Immunization History  Administered Date(s) Administered   Fluad Quad(high Dose 65+)  01/10/2020, 12/10/2021   Influenza Split 01/25/2011, 01/23/2012   Influenza Whole 12/31/2007   Influenza, High Dose Seasonal PF 01/19/2013, 01/10/2014, 01/17/2015, 01/18/2016, 01/02/2017, 01/05/2018, 01/05/2019, 01/05/2019, 02/07/2021   Influenza-Unspecified 01/02/2017   Moderna Sars-Covid-2 Vaccination 02/15/2022   PFIZER(Purple Top)SARS-COV-2 Vaccination 05/07/2019, 06/02/2019, 01/31/2020, 08/02/2020   Pfizer Covid-19 Vaccine Bivalent Booster 70yrs & up 02/19/2021   Pneumococcal Conjugate-13 02/23/2013   Pneumococcal Polysaccharide-23 04/01/2006, 04/01/2018   Td 04/01/2005   Td (Adult) 11/22/2016   Tdap 08/30/2011, 08/03/2021   Zoster Recombinant(Shingrix) 12/04/2016, 07/27/2017   Zoster, Live 04/02/2007   Pertinent  Health Maintenance Due  Topic Date Due   MAMMOGRAM  09/09/2022   INFLUENZA VACCINE   10/31/2022   DEXA SCAN  Completed      03/22/2022   12:49 PM 04/22/2022    3:04 PM 08/21/2022    8:55 AM 11/13/2022    2:53 PM 11/22/2022    1:06 PM  Fall Risk  Falls in the past year? 0 0 0 0 0  Was there an injury with Fall? 0 0 0 0 0  Fall Risk Category Calculator 0 0 0 0 0  Fall Risk Category (Retired) Low      (RETIRED) Patient Fall Risk Level Low fall risk      Patient at Risk for Falls Due to No Fall Risks No Fall Risks No Fall Risks No Fall Risks No Fall Risks  Fall risk Follow up Falls evaluation completed Falls evaluation completed Falls evaluation completed Falls evaluation completed Falls evaluation completed   Functional Status Survey:    Vitals:   11/22/22 1223  BP: 124/88  Pulse: 67  Resp: 18  Temp: (!) 97.1 F (36.2 C)  SpO2: 96%  Weight: 162 lb 3.2 oz (73.6 kg)  Height: 5\' 2"  (1.575 m)   Body mass index is 29.67 kg/m. Physical Exam Vitals reviewed.  Constitutional:      General: She is not in acute distress.    Appearance: Normal appearance. She is normal weight. She is not ill-appearing or diaphoretic.  HENT:     Head: Normocephalic.     Mouth/Throat:     Mouth: Mucous membranes are moist.     Pharynx: Oropharynx is clear. No oropharyngeal exudate or posterior oropharyngeal erythema.  Cardiovascular:     Rate and Rhythm: Normal rate and regular rhythm.     Pulses: Normal pulses.     Heart sounds: Normal heart sounds. No murmur heard.    No friction rub. No gallop.  Pulmonary:     Effort: Pulmonary effort is normal. No respiratory distress.     Breath sounds: Normal breath sounds. No wheezing, rhonchi or rales.  Chest:     Chest wall: No tenderness.  Abdominal:     General: Bowel sounds are normal. There is no distension.     Palpations: Abdomen is soft. There is no mass.     Tenderness: There is abdominal tenderness in the right lower quadrant, suprapubic area and left lower quadrant. There is no right CVA tenderness, left CVA tenderness,  guarding or rebound.  Skin:    General: Skin is warm and dry.     Coloration: Skin is not pale.     Findings: No erythema or rash.  Neurological:     Mental Status: She is alert and oriented to person, place, and time.     Motor: No weakness.     Gait: Gait abnormal.  Psychiatric:        Mood and Affect: Mood is anxious.  Speech: Speech normal.        Behavior: Behavior normal.        Thought Content: Thought content normal.     Labs reviewed: Recent Labs    03/22/22 1338 08/12/22 1050  NA 128* 139  K 3.7 4.7  CL 92* 102  CO2 24 26  GLUCOSE 96 92  BUN 7 10  CREATININE 0.91 0.83  CALCIUM 9.0 9.7   Recent Labs    08/12/22 1050  AST 20  ALT 13  BILITOT 0.8  PROT 6.8   Recent Labs    08/12/22 1050  WBC 5.1  NEUTROABS 2,927  HGB 12.9  HCT 38.3  MCV 94.3  PLT 233   Lab Results  Component Value Date   TSH 4.19 08/12/2022   No results found for: "HGBA1C" Lab Results  Component Value Date   CHOL 177 08/12/2022   HDL 62 08/12/2022   LDLCALC 95 08/12/2022   LDLDIRECT 100.0 04/07/2019   TRIG 103 08/12/2022   CHOLHDL 2.9 08/12/2022    Significant Diagnostic Results in last 30 days:  No results found.  Assessment/Plan 1. Suprapubic pain, acute Tender to palpation  - POCT urinalysis dipstick indicated yellow cloudy urine,positive for large blood,Nitrites and large leukocytes 3+  Will send urine for culture would like to start on antibiotics while awaiting final urine culture.Aware might need to switch antibiotics if final sensitivity does not cover current abxs.  - Culture, Urine  2. Generalized anxiety disorder Continue on Lexapro  - escitalopram (LEXAPRO) 10 MG tablet; Take 1 tablet (10 mg total) by mouth at bedtime.  Dispense: 90 tablet; Refill: 1  Family/ staff Communication: Reviewed plan of care with patient and daughter verbalized understanding    Labs/tests ordered:  - POCT urinalysis dipstick  - Culture, Urine  Next Appointment:  Return if symptoms worsen or fail to improve.   Caesar Bookman, NP

## 2022-11-25 LAB — URINE CULTURE
MICRO NUMBER:: 15375250
SPECIMEN QUALITY:: ADEQUATE

## 2022-11-27 ENCOUNTER — Ambulatory Visit (INDEPENDENT_AMBULATORY_CARE_PROVIDER_SITE_OTHER): Payer: Medicare Other | Admitting: Sports Medicine

## 2022-11-27 ENCOUNTER — Encounter: Payer: Self-pay | Admitting: Sports Medicine

## 2022-11-27 VITALS — BP 110/70 | HR 68 | Temp 97.1°F | Ht 62.0 in | Wt 160.0 lb

## 2022-11-27 DIAGNOSIS — J029 Acute pharyngitis, unspecified: Secondary | ICD-10-CM | POA: Diagnosis not present

## 2022-11-27 DIAGNOSIS — H9201 Otalgia, right ear: Secondary | ICD-10-CM | POA: Diagnosis not present

## 2022-11-27 LAB — POC COVID19 BINAXNOW: SARS Coronavirus 2 Ag: NEGATIVE

## 2022-11-27 LAB — POCT RAPID STREP A (OFFICE): Rapid Strep A Screen: NEGATIVE

## 2022-11-27 MED ORDER — NEOMYCIN-POLYMYXIN-HC 3.5-10000-1 OT SUSP: 4.0000 [drp] | Freq: Three times a day (TID) | OTIC | 0 refills | Status: DC

## 2022-11-27 NOTE — Progress Notes (Signed)
Careteam: Patient Care Team: Ngetich, Donalee Citrin, NP as PCP - General (Family Medicine) Kathleene Hazel, MD as PCP - Cardiology (Cardiology) Alfredo Martinez, MD (Urology) Alanson Puls, MD (Dermatology) Maris Berger, MD (Ophthalmology) Lunette Stands, MD (Orthopedic Surgery) Dareen Piano  (Rheumatology) Donzetta Starch, MD as Consulting Physician (Dermatology) Iva Boop, MD as Consulting Physician (Gastroenterology) Drema Halon, MD (Inactive) as Consulting Physician (Otolaryngology)  PLACE OF SERVICE:  [facility]  Advanced Directive information Does Patient Have a Medical Advance Directive?: Yes, Type of Advance Directive: Living will  Allergies  Allergen Reactions   Alendronate Sodium     upset stomach   Penicillins     Has patient had a PCN reaction causing immediate rash, facial/tongue/throat swelling, SOB or lightheadedness with hypotension: Yes Has patient had a PCN reaction causing severe rash involving mucus membranes or skin necrosis: No Has patient had a PCN reaction that required hospitalization: No Has patient had a PCN reaction occurring within the last 10 years: No If all of the above answers are "NO", then may proceed with Cephalosporin use.    Ciprofloxacin     Had headaches; swelling of feet.    Amoxicillin Rash    Has patient had a PCN reaction causing immediate rash, facial/tongue/throat swelling, SOB or lightheadedness with hypotension: yes Has patient had a PCN reaction causing severe rash involving mucus membranes or skin necrosis: no Has patient had a PCN reaction that required hospitalization: no Has patient had PCN reaction within the last 10 years: no  If all of the above answers are "NO", then may proceed with Cephalosporin use.  REACTION: unspecified   Metronidazole Other (See Comments)    Pounding headaches patient says was bad.  With cipro for diverticulitis rx. Other reaction(s): Other (See Comments) Pounding headaches  patient says was bad.  With cipro for diverticulitis rx.    Chief Complaint  Patient presents with   Acute Visit    Pt complains of white patches on throat, sore throat, ear pain, no fever.     HPI: Patient is a 89 y.o. female is seen today for acute visit  She is accompanied by her daughter  Pt was seen 2 weeks ago for sinus infection and was treated with zpak, pt reports that since Sunday her throat is hurting  No sick contact  Denies fevers,  Post nasal drip + Feels heavy in her head Feels tired  Says both her ears are stuffed up Denies cough , sob  Denies feeling dizzy or lightheaded Eating ok Feels congested in her chest  Strep and covid negative She is on macrobid for UTI    Review of Systems:  Review of Systems  Constitutional:  Negative for chills and fever.  HENT:  Positive for sore throat. Negative for congestion and sinus pain.   Eyes:  Negative for double vision.  Respiratory:  Negative for cough, sputum production and shortness of breath.   Cardiovascular:  Negative for chest pain, palpitations and leg swelling.  Gastrointestinal:  Negative for abdominal pain, heartburn and nausea.  Genitourinary:  Negative for dysuria, frequency and hematuria.  Musculoskeletal:  Negative for falls and myalgias.  Neurological:  Negative for dizziness, sensory change and focal weakness.     Past Medical History:  Diagnosis Date   Abdominal pain 12/2009   hospitalized   Allergic rhinitis    Anxiety    Bladder problem    Chronic constipation    Compression fx, lumbar spine (HCC)    DDD (degenerative  disc disease), lumbar    Diverticulosis of colon    Dog bite(E906.0) 08/30/2011   GERD (gastroesophageal reflux disease)    Hepatic steatosis    pt denies   Hyperlipidemia    Hyperplastic colon polyp    IBS (irritable bowel syndrome)    constipation predominant   Internal hemorrhoids    Longstanding persistent atrial fibrillation (HCC)    Mitral regurgitation     Osteoporosis    dexa 2011 -2.7 hip nl spine   PMR (polymyalgia rheumatica) (HCC) 08/30/2011   under rheum care  on low dose pred 5 mg    Renal disorder    Syncope    Tricuspid regurgitation    Past Surgical History:  Procedure Laterality Date   ABDOMINAL HYSTERECTOMY     with bladder tact and rectocele repair   BLADDER SURGERY     Streched   CATARACT EXTRACTION  2010   both   COLONOSCOPY  2016 was most recent   CYSTO WITH HYDRODISTENSION N/A 10/05/2019   Procedure: CYSTOSCOPY/HYDRODISTENSION;  Surgeon: Alfredo Martinez, MD;  Location: [facility];  Service: Urology;  Laterality: N/A;   TONSILLECTOMY AND ADENOIDECTOMY     Social History:   reports that she has never smoked. She has never used smokeless tobacco. She reports current alcohol use. She reports that she does not use drugs.  Family History  Problem Relation Age of Onset   Stroke Mother 43   Liver disease Brother 32   Heart attack Father 22   Alzheimer's disease Sister 72   Bladder Cancer Sister 84   Crohn's disease Son    Colon cancer Neg Hx    Esophageal cancer Neg Hx    Stomach cancer Neg Hx    Rectal cancer Neg Hx    Liver cancer Neg Hx     Medications: Patient's Medications  New Prescriptions   NEOMYCIN-POLYMYXIN-HYDROCORTISONE (CORTISPORIN) 3.5-10000-1 OTIC SUSPENSION    Place 4 drops into the right ear 3 (three) times daily.  Previous Medications   ACETAMINOPHEN (TYLENOL) 500 MG TABLET    Take 500 mg by mouth at bedtime.   ASCORBIC ACID (VITAMIN C) 100 MG TABLET    Take 500 mg by mouth daily.   BENZONATATE (TESSALON) 200 MG CAPSULE    Take 1 capsule (200 mg total) by mouth 2 (two) times daily as needed for cough.   CALCIUM CARBONATE (OS-CAL) 1250 (500 CA) MG CHEWABLE TABLET    Chew 1 tablet by mouth daily.    DILTIAZEM (CARDIZEM CD) 120 MG 24 HR CAPSULE    TAKE 1 CAPSULE BY MOUTH EVERY DAY   ELIQUIS 5 MG TABS TABLET    TAKE 1 TABLET BY MOUTH TWICE A DAY   ESCITALOPRAM (LEXAPRO) 10 MG TABLET    Take 1 tablet  (10 mg total) by mouth at bedtime.   FLUTICASONE (FLONASE) 50 MCG/ACT NASAL SPRAY    INHALE 2 SPRAYS IN EACH NOSTRIL DAILY X 1 WEEK, THEN 1-2 SPRAYS DAILY. USE LOWEST POSSIBLE DOSE AFTER 1 WEEK   GUAIFENESIN (MUCINEX) 600 MG 12 HR TABLET    Take 600 mg by mouth daily as needed.   LORATADINE (CLARITIN) 10 MG TABLET    TAKE 1 TABLET BY MOUTH EVERY DAY   MULTIPLE VITAMIN (MULTIVITAMIN) TABLET    Take 1 tablet by mouth daily.   NITROFURANTOIN, MACROCRYSTAL-MONOHYDRATE, (MACROBID) 100 MG CAPSULE    Take 1 capsule (100 mg total) by mouth 2 (two) times daily for 7 days.   POLYETHYLENE GLYCOL POWDER (GLYCOLAX/MIRALAX) 17  GM/SCOOP POWDER    Take 17 g by mouth daily. Hold for loose stool   PREMARIN VAGINAL CREAM    INSERT 1 GRAM VAGINALLY THREE TIMES A WEEKLY, THEN ONCE WEEKLY   PROBIOTIC PRODUCT (ALIGN) 4 MG CAPS    Take 1 capsule by mouth as needed.   XIIDRA 5 % SOLN    Apply 1 drop to eye 2 (two) times daily.  Modified Medications   No medications on file  Discontinued Medications   No medications on file    Physical Exam:  Vitals:   11/27/22 1054  BP: 110/70  Pulse: 68  Temp: (!) 97.1 F (36.2 C)  SpO2: 97%  Weight: 160 lb (72.6 kg)  Height: 5\' 2"  (1.575 m)   Body mass index is 29.26 kg/m. Wt Readings from Last 3 Encounters:  11/27/22 160 lb (72.6 kg)  11/22/22 162 lb 3.2 oz (73.6 kg)  11/13/22 161 lb (73 kg)    Physical Exam Constitutional:      Appearance: Normal appearance.  HENT:     Mouth/Throat:     Pharynx: Posterior oropharyngeal erythema present. No oropharyngeal exudate.  Cardiovascular:     Rate and Rhythm: Normal rate and regular rhythm.     Pulses: Normal pulses.     Heart sounds: Normal heart sounds.  Pulmonary:     Effort: Pulmonary effort is normal. No respiratory distress.     Breath sounds: Normal breath sounds. No wheezing or rales.  Abdominal:     General: Bowel sounds are normal. There is no distension.     Palpations: Abdomen is soft.      Tenderness: There is no abdominal tenderness. There is no guarding or rebound.  Neurological:     Mental Status: She is alert. Mental status is at baseline.     Sensory: No sensory deficit.     Motor: No weakness.     Labs reviewed: Basic Metabolic Panel: Recent Labs    03/22/22 1338 08/12/22 1050  NA 128* 139  K 3.7 4.7  CL 92* 102  CO2 24 26  GLUCOSE 96 92  BUN 7 10  CREATININE 0.91 0.83  CALCIUM 9.0 9.7  TSH  --  4.19   Liver Function Tests: Recent Labs    08/12/22 1050  AST 20  ALT 13  BILITOT 0.8  PROT 6.8   No results for input(s): "LIPASE", "AMYLASE" in the last 8760 hours. No results for input(s): "AMMONIA" in the last 8760 hours. CBC: Recent Labs    08/12/22 1050  WBC 5.1  NEUTROABS 2,927  HGB 12.9  HCT 38.3  MCV 94.3  PLT 233   Lipid Panel: Recent Labs    08/12/22 1050  CHOL 177  HDL 62  LDLCALC 95  TRIG 103  CHOLHDL 2.9   TSH: Recent Labs    08/12/22 1050  TSH 4.19   A1C: No results found for: "HGBA1C"   Assessment/Plan   1. Sore throat Rapid strep and covid neg  Instructed patient to use cephacol lozenges and salt water gargling POC Rapid Strep A - POC COVID-19  2. Right ear pain  Pt reported some pain  TM- no erythema, bulging or discharge  - neomycin-polymyxin-hydrocortisone (CORTISPORIN) 3.5-10000-1 OTIC suspension; Place 4 drops into the right ear 3 (three) times daily.  Dispense: 10 mL; Refill: 0

## 2022-12-01 ENCOUNTER — Emergency Department (HOSPITAL_COMMUNITY): Payer: Medicare Other

## 2022-12-01 ENCOUNTER — Other Ambulatory Visit: Payer: Self-pay

## 2022-12-01 ENCOUNTER — Emergency Department (HOSPITAL_COMMUNITY)
Admission: EM | Admit: 2022-12-01 | Discharge: 2022-12-01 | Disposition: A | Discharge status: Home / Self Care | Payer: Medicare Other | Attending: Emergency Medicine | Admitting: Emergency Medicine

## 2022-12-01 DIAGNOSIS — R1032 Left lower quadrant pain: Secondary | ICD-10-CM | POA: Diagnosis not present

## 2022-12-01 DIAGNOSIS — J3489 Other specified disorders of nose and nasal sinuses: Secondary | ICD-10-CM | POA: Diagnosis not present

## 2022-12-01 DIAGNOSIS — I517 Cardiomegaly: Secondary | ICD-10-CM | POA: Diagnosis not present

## 2022-12-01 DIAGNOSIS — K6389 Other specified diseases of intestine: Secondary | ICD-10-CM | POA: Diagnosis not present

## 2022-12-01 DIAGNOSIS — R1031 Right lower quadrant pain: Secondary | ICD-10-CM | POA: Diagnosis not present

## 2022-12-01 DIAGNOSIS — Z20822 Contact with and (suspected) exposure to covid-19: Secondary | ICD-10-CM | POA: Diagnosis not present

## 2022-12-01 DIAGNOSIS — R059 Cough, unspecified: Secondary | ICD-10-CM | POA: Diagnosis not present

## 2022-12-01 DIAGNOSIS — Z79899 Other long term (current) drug therapy: Secondary | ICD-10-CM | POA: Diagnosis not present

## 2022-12-01 DIAGNOSIS — K5730 Diverticulosis of large intestine without perforation or abscess without bleeding: Secondary | ICD-10-CM | POA: Diagnosis not present

## 2022-12-01 DIAGNOSIS — K862 Cyst of pancreas: Secondary | ICD-10-CM | POA: Insufficient documentation

## 2022-12-01 DIAGNOSIS — H66003 Acute suppurative otitis media without spontaneous rupture of ear drum, bilateral: Secondary | ICD-10-CM | POA: Diagnosis not present

## 2022-12-01 DIAGNOSIS — I700 Atherosclerosis of aorta: Secondary | ICD-10-CM | POA: Diagnosis not present

## 2022-12-01 DIAGNOSIS — R1030 Lower abdominal pain, unspecified: Secondary | ICD-10-CM | POA: Diagnosis present

## 2022-12-01 DIAGNOSIS — R9431 Abnormal electrocardiogram [ECG] [EKG]: Secondary | ICD-10-CM | POA: Diagnosis not present

## 2022-12-01 DIAGNOSIS — R109 Unspecified abdominal pain: Secondary | ICD-10-CM

## 2022-12-01 LAB — COMPREHENSIVE METABOLIC PANEL
ALT: 19 U/L (ref 0–44)
AST: 22 U/L (ref 15–41)
Albumin: 4.1 g/dL (ref 3.5–5.0)
Alkaline Phosphatase: 81 U/L (ref 38–126)
Anion gap: 8 (ref 5–15)
BUN: 9 mg/dL (ref 8–23)
CO2: 25 mmol/L (ref 22–32)
Calcium: 9.1 mg/dL (ref 8.9–10.3)
Chloride: 99 mmol/L (ref 98–111)
Creatinine, Ser: 0.69 mg/dL (ref 0.44–1.00)
GFR, Estimated: >60 mL/min (ref >60)
Glucose, Bld: 88 mg/dL (ref 70–99)
Potassium: 3.9 mmol/L (ref 3.5–5.1)
Sodium: 132 mmol/L — ABNORMAL LOW (ref 135–145)
Total Bilirubin: 1 mg/dL (ref 0.3–1.2)
Total Protein: 7.7 g/dL (ref 6.5–8.1)

## 2022-12-01 LAB — CBC WITH DIFFERENTIAL/PLATELET
Abs Immature Granulocytes: 0.03 10*3/uL (ref 0.00–0.07)
Basophils Absolute: 0.1 10*3/uL (ref 0.0–0.1)
Basophils Relative: 1 %
Eosinophils Absolute: 0.1 10*3/uL (ref 0.0–0.5)
Eosinophils Relative: 1 %
HCT: 39.5 % (ref 36.0–46.0)
Hemoglobin: 13.2 g/dL (ref 12.0–15.0)
Immature Granulocytes: 0 %
Lymphocytes Relative: 22 %
Lymphs Abs: 1.8 10*3/uL (ref 0.7–4.0)
MCH: 31.4 pg (ref 26.0–34.0)
MCHC: 33.4 g/dL (ref 30.0–36.0)
MCV: 94 fL (ref 80.0–100.0)
Monocytes Absolute: 0.6 10*3/uL (ref 0.1–1.0)
Monocytes Relative: 7 %
Neutro Abs: 5.4 10*3/uL (ref 1.7–7.7)
Neutrophils Relative %: 69 %
Platelets: 227 10*3/uL (ref 150–400)
RBC: 4.2 MIL/uL (ref 3.87–5.11)
RDW: 13.5 % (ref 11.5–15.5)
WBC: 7.9 10*3/uL (ref 4.0–10.5)
nRBC: 0 % (ref 0.0–0.2)

## 2022-12-01 LAB — URINALYSIS, ROUTINE W REFLEX MICROSCOPIC
Bilirubin Urine: NEGATIVE
Glucose, UA: NEGATIVE mg/dL
Hgb urine dipstick: NEGATIVE
Ketones, ur: NEGATIVE mg/dL
Leukocytes,Ua: NEGATIVE
Nitrite: NEGATIVE
Protein, ur: NEGATIVE mg/dL
Specific Gravity, Urine: 1.003 — ABNORMAL LOW (ref 1.005–1.030)
pH: 8 (ref 5.0–8.0)

## 2022-12-01 LAB — TROPONIN I (HIGH SENSITIVITY)
Troponin I (High Sensitivity): 7 ng/L (ref <18)
Troponin I (High Sensitivity): 7 ng/L (ref <18)

## 2022-12-01 LAB — SARS CORONAVIRUS 2 BY RT PCR: SARS Coronavirus 2 by RT PCR: NEGATIVE

## 2022-12-01 LAB — LIPASE, BLOOD: Lipase: 56 U/L — ABNORMAL HIGH (ref 11–51)

## 2022-12-01 MED ORDER — CEFDINIR 300 MG PO CAPS: 300.0000 mg | ORAL_CAPSULE | Freq: Two times a day (BID) | ORAL | 0 refills | Status: AC

## 2022-12-01 MED ORDER — ACETAMINOPHEN 325 MG PO TABS: 650.0000 mg | ORAL_TABLET | Freq: Once | ORAL | Status: DC | PRN

## 2022-12-01 MED ORDER — SODIUM CHLORIDE (PF) 0.9 % IJ SOLN
INTRAMUSCULAR | Status: AC
  Filled 2022-12-01: qty 50

## 2022-12-01 MED ORDER — IOHEXOL 300 MG/ML  SOLN
100.0000 mL | Freq: Once | INTRAMUSCULAR | Status: AC | PRN
  Administered 2022-12-01: 100 mL via INTRAVENOUS

## 2022-12-01 NOTE — ED Triage Notes (Signed)
Pt arrived via POV. C/o URI w/sinus pain and lower abd pain for several weeks. Has been to PCP for same issues. Had z-pack, and another abx.  AOX4

## 2022-12-01 NOTE — ED Provider Notes (Signed)
[place] [facility] DEPART[facility] AT [facility] Provider Note   CSN: [id number] Arrival date & time: 12/01/22  8295     History {Add pertinent medical, surgical, social history, OB history to HPI:1} Chief Complaint  Patient presents with   URI   Abdominal Pain   Facial Pain    Ashley Lawson is a 89 y.o. female.  HPI     89 year old female with a history of hyperlipidemia, atrial fibrillation, PMR, presents with concern for abdominal pain, sore throat.   Past Medical History:  Diagnosis Date   Abdominal pain 12/2009   hospitalized   Allergic rhinitis    Anxiety    Bladder problem    Chronic constipation    Compression fx, lumbar spine (HCC)    DDD (degenerative disc disease), lumbar    Diverticulosis of colon    Dog bite(E906.0) 08/30/2011   GERD (gastroesophageal reflux disease)    Hepatic steatosis    pt denies   Hyperlipidemia    Hyperplastic colon polyp    IBS (irritable bowel syndrome)    constipation predominant   Internal hemorrhoids    Longstanding persistent atrial fibrillation (HCC)    Mitral regurgitation    Osteoporosis    dexa 2011 -2.7 hip nl spine   PMR (polymyalgia rheumatica) (HCC) 08/30/2011   under rheum care  on low dose pred 5 mg    Renal disorder    Syncope    Tricuspid regurgitation      Home Medications Prior to Admission medications   Medication Sig Start Date End Date Taking? Authorizing Provider  acetaminophen (TYLENOL) 500 MG tablet Take 500 mg by mouth at bedtime.    [provider]  Ascorbic Acid (VITAMIN C) 100 MG tablet Take 500 mg by mouth daily.    [provider]  benzonatate (TESSALON) 200 MG capsule Take 1 capsule (200 mg total) by mouth 2 (two) times daily as needed for cough. 11/13/22   Venita Sheffield, MD  calcium carbonate (OS-CAL) 1250 (500 Ca) MG chewable tablet Chew 1 tablet by mouth daily.     [provider]  diltiazem (CARDIZEM CD) 120 MG 24 hr capsule TAKE 1 CAPSULE  BY MOUTH EVERY DAY 06/17/22   Kathleene Hazel, MD  ELIQUIS 5 MG TABS tablet TAKE 1 TABLET BY MOUTH TWICE A DAY 08/09/22   Kathleene Hazel, MD  escitalopram (LEXAPRO) 10 MG tablet Take 1 tablet (10 mg total) by mouth at bedtime. 11/22/22   Ngetich, Dinah C, NP  fluticasone (FLONASE) 50 MCG/ACT nasal spray INHALE 2 SPRAYS IN EACH NOSTRIL DAILY X 1 WEEK, THEN 1-2 SPRAYS DAILY. USE LOWEST POSSIBLE DOSE AFTER 1 WEEK 08/16/22   Ngetich, Dinah C, NP  guaiFENesin (MUCINEX) 600 MG 12 hr tablet Take 600 mg by mouth daily as needed.    [provider]  loratadine (CLARITIN) 10 MG tablet TAKE 1 TABLET BY MOUTH EVERY DAY 07/31/22   Ngetich, Dinah C, NP  Multiple Vitamin (MULTIVITAMIN) tablet Take 1 tablet by mouth daily.    [provider]  neomycin-polymyxin-hydrocortisone (CORTISPORIN) 3.5-10000-1 OTIC suspension Place 4 drops into the right ear 3 (three) times daily. 11/27/22   Venita Sheffield, MD  polyethylene glycol powder (GLYCOLAX/MIRALAX) 17 GM/SCOOP powder Take 17 g by mouth daily. Hold for loose stool 04/26/20   Ngetich, Donalee Citrin, NP  PREMARIN vaginal cream INSERT 1 GRAM VAGINALLY THREE TIMES A WEEKLY, THEN ONCE WEEKLY 08/23/19   [provider]  Probiotic Product (ALIGN) 4 MG CAPS  Take 1 capsule by mouth as needed.    [provider]  XIIDRA 5 % SOLN Apply 1 drop to eye 2 (two) times daily. 08/01/22   [provider]      Allergies    Alendronate sodium, Penicillins, Ciprofloxacin, Amoxicillin, and Metronidazole    Review of Systems   Review of Systems  Physical Exam Updated Vital Signs BP 128/70 (BP Location: Left Arm)   Pulse 71   Temp 98.2 F (36.8 C) (Oral)   Resp 16   Ht 5\' 2"  (1.575 m)   Wt 73.9 kg   SpO2 100%   BMI 29.81 kg/m  Physical Exam  ED Results / Procedures / Treatments   Labs (all labs ordered are listed, but only abnormal results are displayed) Labs Reviewed  URINALYSIS, ROUTINE W REFLEX MICROSCOPIC     EKG None  Radiology No results found.  Procedures Procedures  {Document cardiac monitor, telemetry assessment procedure when appropriate:1}  Medications Ordered in ED Medications  acetaminophen (TYLENOL) tablet 650 mg (has no administration in time range)    ED Course/ Medical Decision Making/ A&P   {   Click here for ABCD2, HEART and other calculatorsREFRESH Note before signing :1}                              Medical Decision Making Amount and/or Complexity of Data Reviewed Labs: ordered.  Risk OTC drugs.   ***  {Document critical care time when appropriate:1} {Document review of labs and clinical decision tools ie heart score, Chads2Vasc2 etc:1}  {Document your independent review of radiology images, and any outside records:1} {Document your discussion with family members, caretakers, and with consultants:1} {Document social determinants of health affecting pt's care:1} {Document your decision making why or why not admission, treatments were needed:1} Final Clinical Impression(s) / ED Diagnoses Final diagnoses:  None    Rx / DC Orders ED Discharge Orders     None

## 2022-12-03 ENCOUNTER — Telehealth: Payer: Self-pay

## 2022-12-03 NOTE — Telephone Encounter (Signed)
Patient's daughter called sating that patient was put on antibiotic after going to ED on Sunday. She states that since she has been taking  medication patient seems to be sleeping a lot more. She states that excessive sleepiness is one of the symptoms. Should patient continue to take medication?  Message sent to Richarda Blade, NP

## 2022-12-03 NOTE — Telephone Encounter (Signed)
Do you have appointment with urologist scheduled ?  Unclear if sleeping more due to medication or sometimes due to infection making her tired.monitor for now but if symptoms persist then will need to discontinue and follow up with urologist.

## 2022-12-05 ENCOUNTER — Telehealth: Payer: Self-pay

## 2022-12-05 NOTE — Telephone Encounter (Signed)
I called and left message for patient's daughter to call the office.

## 2022-12-05 NOTE — Transitions of Care (Post Inpatient/ED Visit) (Addendum)
12/05/2022  Name: Ashley Lawson MRN: [id number] DOB: [birth year]  Today's TOC FU Call Status: Today's TOC FU Call Status:: Successful TOC FU Call Completed TOC FU Call Complete Date: 12/05/22 Patient's Name and Date of Birth confirmed.   Red on EMMI-ED Discharge Alert Date & Reason:12/03/22 "Scheduled follow-up appt? No"   Transition Care Management Follow-up Telephone Call Discharge Facility: [facility]) Type of Discharge: Emergency Department Reason for ED Visit: Other: ("abd pain,unspecified") How have you been since you were released from the hospital?: Same (pt states she is "somewhat better-throat doesn't hurt and not coughing as much." she continues to have some abd pain with urination and burning sensation. Urine reported being clear light yellow and cloudy at times." Pt unsure if she is taking abx-) Any questions or concerns?: No  Items Reviewed: Did you receive and understand the discharge instructions provided?: Yes Medications obtained,verified, and reconciled?: No Medications Not Reviewed Reasons:: Other: (caregiver in the home with pt-reports pt has some dementia and daughter keeps meds locked up and away from pt-daughter currently at work-she will check with her to ensure pt taking abx ordered from ED visit) Any new allergies since your discharge?: No Dietary orders reviewed?: Yes Type of Diet Ordered:: low salt/heart healthy Do you have support at home?: Yes People in Home: child(ren), adult Name of Support/Comfort Primary Source: pt lives with daughter and has caregiver in the home while daughter at work  Medications Reviewed Today: Medications Reviewed Today     Reviewed by Charlyn Minerva, RN (Registered Nurse) on 12/05/22 at 1147  Med List Status: <None>   Medication Order Taking? Sig Documenting Provider Last Dose Status Informant  acetaminophen (TYLENOL) 500 MG tablet [id number] No Take 500 mg by mouth at bedtime. [provider]  Taking Active   Ascorbic Acid (VITAMIN C) 100 MG tablet [id number] No Take 500 mg by mouth daily. [provider] Taking Active Family Member  benzonatate (TESSALON) 200 MG capsule [id number] No Take 1 capsule (200 mg total) by mouth 2 (two) times daily as needed for cough. Venita Sheffield, MD Taking Active   calcium carbonate (OS-CAL) 1250 (500 Ca) MG chewable tablet [id number] No Chew 1 tablet by mouth daily.  [provider] Taking Active Family Member  cefdinir (OMNICEF) 300 MG capsule [id number]  Take 1 capsule (300 mg total) by mouth 2 (two) times daily for 7 days. Alvira Monday, MD  Active   diltiazem [facility] CD) 120 MG 24 hr capsule [id number] No TAKE 1 CAPSULE BY MOUTH EVERY DAY Kathleene Hazel, MD Taking Active   ELIQUIS 5 MG TABS tablet [id number] No TAKE 1 TABLET BY MOUTH TWICE A DAY Kathleene Hazel, MD Taking Active   escitalopram (LEXAPRO) 10 MG tablet [id number] No Take 1 tablet (10 mg total) by mouth at bedtime. Ngetich, Dinah C, NP Taking Active   fluticasone (FLONASE) 50 MCG/ACT nasal spray [id number] No INHALE 2 SPRAYS IN EACH NOSTRIL DAILY X 1 WEEK, THEN 1-2 SPRAYS DAILY. USE LOWEST POSSIBLE DOSE AFTER 1 WEEK Ngetich, Dinah C, NP Taking Active   guaiFENesin (MUCINEX) 600 MG 12 hr tablet [id number] No Take 600 mg by mouth daily as needed. [provider] Taking Active Family Member  loratadine (CLARITIN) 10 MG tablet [id number] No TAKE 1 TABLET BY MOUTH EVERY DAY Ngetich, Dinah C, NP Taking Active   Multiple Vitamin (MULTIVITAMIN) tablet [id number] No Take 1 tablet by mouth daily. [provider] Taking Active Family Member  neomycin-polymyxin-hydrocortisone (CORTISPORIN) 3.5-10000-1 OTIC suspension [id number]  Place 4 drops into the right ear 3 (three) times daily. Venita Sheffield, MD  Active   polyethylene glycol powder (GLYCOLAX/MIRALAX) 17 GM/SCOOP powder [id number] No Take 17 g by mouth daily. Hold for loose stool Ngetich,  Dinah C, NP Taking Active   PREMARIN vaginal cream [id number] No INSERT 1 GRAM VAGINALLY THREE TIMES A WEEKLY, THEN ONCE WEEKLY [provider] Taking Active   Probiotic Product (ALIGN) 4 MG CAPS [id number] No Take 1 capsule by mouth as needed. [provider] Taking Active Family Member  XIIDRA 5 % SOLN [id number] No Apply 1 drop to eye 2 (two) times daily. [provider] Taking Active             Home Care and Equipment/Supplies: Were Home Health Services Ordered?: NA Any new equipment or medical supplies ordered?: NA  Functional Questionnaire: Do you need assistance with bathing/showering or dressing?: Yes (daughter and/or caregiver assist with ADLs/IADLs) Do you need assistance with meal preparation?: Yes Do you need assistance with eating?: No Do you have difficulty maintaining continence: No Do you need assistance with getting out of bed/getting out of a chair/moving?: No Do you have difficulty managing or taking your medications?: Yes  Follow up appointments reviewed: PCP Follow-up appointment confirmed?: Yes Date of PCP follow-up appointment?: 12/06/22 Follow-up Provider: [facility] Follow-up appointment confirmed?: No Reason Specialist Follow-Up Not Confirmed: Patient has Specialist Provider Number and will Call for Appointment (Pt/caregiver unsure if daughter has made urology appt-they will follow up and ask daughter) Do you need transportation to your follow-up appointment?: No Do you understand care options if your condition(s) worsen?: Yes-patient verbalized understanding  SDOH Interventions Today    Flowsheet Row Most Recent Value  SDOH Interventions   Food Insecurity Interventions Intervention Not Indicated  Transportation Interventions Intervention Not Indicated      TOC Interventions Today    Flowsheet Row Most Recent Value  TOC Interventions   TOC Interventions Discussed/Reviewed TOC Interventions  Discussed, S/S of infection      Interventions Today    Flowsheet Row Most Recent Value  General Interventions   General Interventions Discussed/Reviewed General Interventions Discussed, Doctor Visits  Doctor Visits Discussed/Reviewed Doctor Visits Discussed, PCP, Specialist  PCP/Specialist Visits Compliance with follow-up visit  Education Interventions   Education Provided Provided Education  Provided Verbal Education On When to see the doctor, Medication  Nutrition Interventions   Nutrition Discussed/Reviewed Nutrition Discussed  Pharmacy Interventions   Pharmacy Dicussed/Reviewed Pharmacy Topics Discussed, Medications and their functions  Safety Interventions   Safety Discussed/Reviewed Safety Discussed       Alessandra Grout [facility] Care Management Care Management Community Coordinator Direct Phone: [phone] Toll Free: [phone] Fax: [phone]

## 2022-12-05 NOTE — Patient Outreach (Signed)
  Care Coordination   Follow Up Visit Note   12/05/2022 Name: Ashley Lawson MRN: [id number] DOB: [birth year]  Voicemail message received from daughter-Karen.Return call to daughter who is currently on lunch break. Discussed phone call with patient earlier. Daughter voices that pt "is very confused-more than normal lately." Discussed s/s of UTI esp in elderly population.She confirms pt is taking abxs as ordered. She has not made urology appt-states she will call office. Daughter reports pt has not been seen by urologist in several years. She confirms that she does plan to take pt to PCP appt tomorrow. Reviewed with daughter some non-pharmacological measures to help with UTI and she is already doing those measures. No further RN CM needs or concerns at this time.      Care Coordination Interventions:  Yes, provided    TOC Interventions Today    Flowsheet Row Most Recent Value  TOC Interventions   TOC Interventions Discussed/Reviewed TOC Interventions Discussed      Interventions Today    Flowsheet Row Most Recent Value  General Interventions   General Interventions Discussed/Reviewed General Interventions Discussed, Doctor Visits  Doctor Visits Discussed/Reviewed PCP, Specialist  PCP/Specialist Visits Compliance with follow-up visit  Education Interventions   Provided Verbal Education On Medication, When to see the doctor, Other  [non-pharmacological measures to help with UTI]  Nutrition Interventions   Nutrition Discussed/Reviewed Fluid intake  Pharmacy Interventions   Pharmacy Dicussed/Reviewed Pharmacy Topics Discussed, Medications and their functions  Safety Interventions   Safety Discussed/Reviewed Safety Discussed       Follow up plan: No further intervention required.   Encounter Outcome:  Patient Visit Completed    Alessandra Grout [facility] Care Management Care Management Community Coordinator Direct Phone: [phone] Toll Free:  [phone] Fax: [phone]

## 2022-12-06 ENCOUNTER — Encounter: Payer: Self-pay | Admitting: Family

## 2022-12-06 ENCOUNTER — Ambulatory Visit (INDEPENDENT_AMBULATORY_CARE_PROVIDER_SITE_OTHER): Payer: Medicare Other | Admitting: Family

## 2022-12-06 VITALS — BP 120/74 | HR 54 | Temp 97.1°F | Resp 16 | Ht 62.0 in | Wt 159.0 lb

## 2022-12-06 DIAGNOSIS — N390 Urinary tract infection, site not specified: Secondary | ICD-10-CM

## 2022-12-06 DIAGNOSIS — R301 Vesical tenesmus: Secondary | ICD-10-CM

## 2022-12-06 DIAGNOSIS — R338 Other retention of urine: Secondary | ICD-10-CM | POA: Diagnosis not present

## 2022-12-06 DIAGNOSIS — K862 Cyst of pancreas: Secondary | ICD-10-CM

## 2022-12-06 DIAGNOSIS — R1031 Right lower quadrant pain: Secondary | ICD-10-CM | POA: Diagnosis not present

## 2022-12-06 MED ORDER — PHENAZOPYRIDINE HCL 100 MG PO TABS
100.0000 mg | ORAL_TABLET | Freq: Three times a day (TID) | ORAL | 0 refills | Status: AC
Start: 2022-12-06 — End: 2022-12-09

## 2022-12-06 NOTE — Progress Notes (Unsigned)
Provider: Richarda Blade FNP-C  Anthon Harpole, Donalee Citrin, NP  Patient Care Team: Mable Dara, Donalee Citrin, NP as PCP - General (Family Medicine) Kathleene Hazel, MD as PCP - Cardiology (Cardiology) Alfredo Martinez, MD (Urology) Alanson Puls, MD (Dermatology) Maris Berger, MD (Ophthalmology) Lunette Stands, MD (Orthopedic Surgery) Dareen Piano  (Rheumatology) Donzetta Starch, MD as Consulting Physician (Dermatology) Iva Boop, MD as Consulting Physician (Gastroenterology) Drema Halon, MD (Inactive) as Consulting Physician (Otolaryngology)  Extended Emergency Contact Information Primary Emergency Contact: Apple,Karen Address: [address]          [place] [address] [place] Home Phone: [phone] Work Phone: [phone] Mobile Phone: [phone] Relation: Daughter  Code Status:  Full Code  Goals of care: Advanced Directive information    12/06/2022    2:51 PM  Advanced Directives  Does Patient Have a Medical Advance Directive? Yes  Type of Estate agent of [place];Living will  Does patient want to make changes to medical advance directive? Yes (Inpatient - patient defers changing a medical advance directive at this time - Information given)  Copy of Healthcare Power of Attorney in Chart? Yes - validated most recent copy scanned in chart (See row information)     Chief Complaint  Patient presents with  . Hospitalization Follow-up    In the ER Sunday night, abdominal pain, ear pain and sore throat continue    HPI:  Pt is a 89 y.o. female seen today for an acute visit for follow up ED visit for abdominal pain,ear and sore throat. Urine culture showed staphylococcus Lugdunensis  > 100,000 colonies.she was prescribed cefdinir 300 mg capsule twice daily EKG,CXR were normal. CT abdomen showed cystic lesion on Pancreas measuring 2.1 cm at the inferior margin of the uncinate process.Aortic Atherosclerosis also noted.outpatient  MRI recommended.Also showed L 2 Compression fracture.Post -void residual was around 30 -40 minutes after voiding was 230 cc without any hydronephrosis or urine retention noted.she was discharged home advised to follow up with [facility]ist seen by Dr.MacDaiarmid in the past.  She denies any fever,chills,nausea,vomiting,flank pain,difficult urination or hematuria,    Past Medical History:  Diagnosis Date  . Abdominal pain 12/2009   hospitalized  . Allergic rhinitis   . Anxiety   . Bladder problem   . Chronic constipation   . Compression fx, lumbar spine (HCC)   . DDD (degenerative disc disease), lumbar   . Diverticulosis of colon   . Dog bite(E906.0) 08/30/2011  . GERD (gastroesophageal reflux disease)   . Hepatic steatosis    pt denies  . Hyperlipidemia   . Hyperplastic colon polyp   . IBS (irritable bowel syndrome)    constipation predominant  . Internal hemorrhoids   . Longstanding persistent atrial fibrillation (HCC)   . Mitral regurgitation   . Osteoporosis    dexa 2011 -2.7 hip nl spine  . PMR (polymyalgia rheumatica) (HCC) 08/30/2011   under rheum care  on low dose pred 5 mg   . Renal disorder   . Syncope   . Tricuspid regurgitation    Past Surgical History:  Procedure Laterality Date  . ABDOMINAL HYSTERECTOMY     with bladder tact and rectocele repair  . BLADDER SURGERY     Streched  . CATARACT EXTRACTION  2010   both  . COLONOSCOPY  2016 was most recent  . CYSTO WITH HYDRODISTENSION N/A 10/05/2019   Procedure: CYSTOSCOPY/HYDRODISTENSION;  Surgeon: Alfredo Martinez, MD;  Location: [facility];  Service: Urology;  Laterality: N/A;  .  TONSILLECTOMY AND ADENOIDECTOMY      Allergies  Allergen Reactions  . Alendronate Sodium     upset stomach  . Penicillins     Has patient had a PCN reaction causing immediate rash, facial/tongue/throat swelling, SOB or lightheadedness with hypotension: Yes Has patient had a PCN reaction causing severe rash involving mucus  membranes or skin necrosis: No Has patient had a PCN reaction that required hospitalization: No Has patient had a PCN reaction occurring within the last 10 years: No If all of the above answers are "NO", then may proceed with Cephalosporin use.   . Ciprofloxacin     Had headaches; swelling of feet.   Marland Kitchen Amoxicillin Rash    Has patient had a PCN reaction causing immediate rash, facial/tongue/throat swelling, SOB or lightheadedness with hypotension: yes Has patient had a PCN reaction causing severe rash involving mucus membranes or skin necrosis: no Has patient had a PCN reaction that required hospitalization: no Has patient had PCN reaction within the last 10 years: no  If all of the above answers are "NO", then may proceed with Cephalosporin use.  REACTION: unspecified  . Metronidazole Other (See Comments)    Pounding headaches patient says was bad.  With cipro for diverticulitis rx. Other reaction(s): Other (See Comments) Pounding headaches patient says was bad.  With cipro for diverticulitis rx.    Outpatient Encounter Medications as of 12/06/2022  Medication Sig  . acetaminophen (TYLENOL) 500 MG tablet Take 500 mg by mouth at bedtime.  . Ascorbic Acid (VITAMIN C) 100 MG tablet Take 500 mg by mouth daily.  . benzonatate (TESSALON) 200 MG capsule Take 1 capsule (200 mg total) by mouth 2 (two) times daily as needed for cough.  . calcium carbonate (OS-CAL) 1250 (500 Ca) MG chewable tablet Chew 1 tablet by mouth daily.   . cefdinir (OMNICEF) 300 MG capsule Take 1 capsule (300 mg total) by mouth 2 (two) times daily for 7 days.  Marland Kitchen diltiazem (CARDIZEM CD) 120 MG 24 hr capsule TAKE 1 CAPSULE BY MOUTH EVERY DAY  . ELIQUIS 5 MG TABS tablet TAKE 1 TABLET BY MOUTH TWICE A DAY  . escitalopram (LEXAPRO) 10 MG tablet Take 1 tablet (10 mg total) by mouth at bedtime.  . fluticasone (FLONASE) 50 MCG/ACT nasal spray INHALE 2 SPRAYS IN EACH NOSTRIL DAILY X 1 WEEK, THEN 1-2 SPRAYS DAILY. USE LOWEST POSSIBLE  DOSE AFTER 1 WEEK  . guaiFENesin (MUCINEX) 600 MG 12 hr tablet Take 600 mg by mouth daily as needed.  . loratadine (CLARITIN) 10 MG tablet TAKE 1 TABLET BY MOUTH EVERY DAY  . Multiple Vitamin (MULTIVITAMIN) tablet Take 1 tablet by mouth daily.  Marland Kitchen neomycin-polymyxin-hydrocortisone (CORTISPORIN) 3.5-10000-1 OTIC suspension Place 4 drops into the right ear 3 (three) times daily.  . polyethylene glycol powder (GLYCOLAX/MIRALAX) 17 GM/SCOOP powder Take 17 g by mouth daily. Hold for loose stool  . PREMARIN vaginal cream INSERT 1 GRAM VAGINALLY THREE TIMES A WEEKLY, THEN ONCE WEEKLY  . Probiotic Product (ALIGN) 4 MG CAPS Take 1 capsule by mouth as needed.  Marland Kitchen XIIDRA 5 % SOLN Apply 1 drop to eye 2 (two) times daily.   No facility-administered encounter medications on file as of 12/06/2022.    Review of Systems  Constitutional:  Negative for appetite change, chills, fatigue, fever and unexpected weight change.  HENT:  Negative for congestion, dental problem, ear discharge, ear pain, facial swelling, hearing loss, nosebleeds, postnasal drip, rhinorrhea, sinus pressure, sinus pain, sneezing, sore throat, tinnitus and  trouble swallowing.   Eyes:  Negative for pain, discharge, redness, itching and visual disturbance.  Respiratory:  Negative for cough, chest tightness, shortness of breath and wheezing.   Cardiovascular:  Negative for chest pain, palpitations and leg swelling.  Gastrointestinal:  Positive for abdominal pain. Negative for abdominal distention, blood in stool, constipation, diarrhea, nausea and vomiting.  Genitourinary:  Positive for frequency. Negative for difficulty urinating, dysuria, flank pain, hematuria and urgency.  Musculoskeletal:  Negative for arthralgias, back pain, gait problem, joint swelling, myalgias, neck pain and neck stiffness.  Skin:  Negative for color change, pallor, rash and wound.  Neurological:  Negative for dizziness, weakness, light-headedness, numbness and headaches.   Hematological:  Does not bruise/bleed easily.  Psychiatric/Behavioral:  Negative for agitation, behavioral problems, confusion, hallucinations and sleep disturbance. The patient is not nervous/anxious.     Immunization History  Administered Date(s) Administered  . Fluad Quad(high Dose 65+) 01/10/2020, 12/10/2021  . Influenza Split 01/25/2011, 01/23/2012  . Influenza Whole 12/31/2007  . Influenza, High Dose Seasonal PF 01/19/2013, 01/10/2014, 01/17/2015, 01/18/2016, 01/02/2017, 01/05/2018, 01/05/2019, 01/05/2019, 02/07/2021  . Influenza-Unspecified 01/02/2017  . Moderna Sars-Covid-2 Vaccination 02/15/2022  . PFIZER(Purple Top)SARS-COV-2 Vaccination 05/07/2019, 06/02/2019, 01/31/2020, 08/02/2020  . Research officer, trade [employer] 58yrs & up 02/19/2021  . Pneumococcal Conjugate-13 02/23/2013  . Pneumococcal Polysaccharide-23 04/01/2006, 04/01/2018  . Td 04/01/2005  . Td (Adult) 11/22/2016  . Tdap 08/30/2011, 08/03/2021  . Zoster Recombinant(Shingrix) 12/04/2016, 07/27/2017  . Zoster, Live 04/02/2007   Pertinent  Health Maintenance Due  Topic Date Due  . MAMMOGRAM  09/09/2022  . INFLUENZA VACCINE  10/31/2022  . DEXA SCAN  Completed      08/21/2022    8:55 AM 11/13/2022    2:53 PM 11/22/2022    1:06 PM 11/27/2022   10:51 AM 12/06/2022    2:52 PM  Fall Risk  Falls in the past year? 0 0 0 0 0  Was there an injury with Fall? 0 0 0    Fall Risk Category Calculator 0 0 0    Patient at Risk for Falls Due to No Fall Risks No Fall Risks No Fall Risks    Fall risk Follow up Falls evaluation completed Falls evaluation completed Falls evaluation completed     Functional Status Survey:    Vitals:   12/06/22 1454  BP: 120/74  Pulse: (!) 54  Resp: 16  Temp: (!) 97.1 F (36.2 C)  SpO2: 92%  Weight: 159 lb (72.1 kg)  Height: 5\' 2"  (1.575 m)   Body mass index is 29.08 kg/m. Physical Exam Vitals reviewed.  Constitutional:      General: She is not in acute distress.     Appearance: Normal appearance. She is overweight. She is not ill-appearing or diaphoretic.  HENT:     Head: Normocephalic.     Right Ear: Tympanic membrane, ear canal and external ear normal. There is no impacted cerumen.     Left Ear: Tympanic membrane, ear canal and external ear normal. There is no impacted cerumen.     Nose: Nose normal. No congestion or rhinorrhea.     Mouth/Throat:     Mouth: Mucous membranes are moist.     Pharynx: Oropharynx is clear. No oropharyngeal exudate or posterior oropharyngeal erythema.  Eyes:     General: No scleral icterus.       Right eye: No discharge.        Left eye: No discharge.     Extraocular Movements: Extraocular movements intact.  Conjunctiva/sclera: Conjunctivae normal.     Pupils: Pupils are equal, round, and reactive to light.  Neck:     Vascular: No carotid bruit.  Cardiovascular:     Rate and Rhythm: Normal rate and regular rhythm.     Pulses: Normal pulses.     Heart sounds: Normal heart sounds. No murmur heard.    No friction rub. No gallop.  Pulmonary:     Effort: Pulmonary effort is normal. No respiratory distress.     Breath sounds: Normal breath sounds. No wheezing, rhonchi or rales.  Chest:     Chest wall: No tenderness.  Abdominal:     General: Bowel sounds are normal. There is no distension.     Palpations: Abdomen is soft. There is no mass.     Tenderness: There is abdominal tenderness in the right lower quadrant, suprapubic area and left lower quadrant. There is no right CVA tenderness, left CVA tenderness, guarding or rebound.  Musculoskeletal:        General: No swelling or tenderness. Normal range of motion.     Cervical back: Normal range of motion. No rigidity or tenderness.     Right lower leg: No edema.     Left lower leg: No edema.  Lymphadenopathy:     Cervical: No cervical adenopathy.  Skin:    General: Skin is warm and dry.     Coloration: Skin is not pale.     Findings: No bruising, erythema, lesion  or rash.  Neurological:     Mental Status: She is alert and oriented to person, place, and time.     Cranial Nerves: No cranial nerve deficit.     Sensory: No sensory deficit.     Motor: No weakness.     Coordination: Coordination normal.     Gait: Gait normal.  Psychiatric:        Mood and Affect: Mood normal.        Speech: Speech normal.        Behavior: Behavior normal.    Labs reviewed: Recent Labs    03/22/22 1338 08/12/22 1050 12/01/22 0953  NA 128* 139 132*  K 3.7 4.7 3.9  CL 92* 102 99  CO2 24 26 25   GLUCOSE 96 92 88  BUN 7 10 9   CREATININE 0.91 0.83 0.69  CALCIUM 9.0 9.7 9.1   Recent Labs    08/12/22 1050 12/01/22 0953  AST 20 22  ALT 13 19  ALKPHOS  --  81  BILITOT 0.8 1.0  PROT 6.8 7.7  ALBUMIN  --  4.1   Recent Labs    08/12/22 1050 12/01/22 0953  WBC 5.1 7.9  NEUTROABS 2,927 5.4  HGB 12.9 13.2  HCT 38.3 39.5  MCV 94.3 94.0  PLT 233 227   Lab Results  Component Value Date   TSH 4.19 08/12/2022   No results found for: "HGBA1C" Lab Results  Component Value Date   CHOL 177 08/12/2022   HDL 62 08/12/2022   LDLCALC 95 08/12/2022   LDLDIRECT 100.0 04/07/2019   TRIG 103 08/12/2022   CHOLHDL 2.9 08/12/2022    Significant Diagnostic Results in last 30 days:  CT ABDOMEN PELVIS W CONTRAST  Result Date: 12/01/2022 CLINICAL DATA:  LLQ abdominal pain EXAM: CT ABDOMEN AND PELVIS WITH CONTRAST TECHNIQUE: Multidetector CT imaging of the abdomen and pelvis was performed using the standard protocol following bolus administration of intravenous contrast. RADIATION DOSE REDUCTION: This exam was performed according to the departmental dose-optimization program  which includes automated exposure control, adjustment of the mA and/or kV according to patient size and/or use of iterative reconstruction technique. CONTRAST:  OMNIPAQUE IOHEXOL 300 MG/ML  SOLN COMPARISON:  CT 08/03/2019 FINDINGS: Lower chest: Trace bilateral pleural fluid. Hepatobiliary: No  focal liver abnormality is seen. No gallstones, gallbladder wall thickening, or biliary dilatation. Pancreas: 2.1 cm cystic lesion at the inferior margin of the uncinate process. No ductal dilatation or regional inflammatory change. Spleen: Normal in size without focal abnormality. Adrenals/Urinary Tract: Adrenal glands are unremarkable. Kidneys are normal, without renal calculi, focal lesion, or hydronephrosis. Bladder is unremarkable. Stomach/Bowel: None stomach and small bowel decompressed. Appendix not discretely identified. Colon is incompletely distended with scattered distal descending and proximal sigmoid diverticula; no adjacent inflammatory change. Vascular/Lymphatic: Mild scattered calcified aortic atheromatous plaque without aneurysm or stenosis. Portal vein patent. No abdominal or pelvic adenopathy. Reproductive: Status post hysterectomy. No adnexal masses. Other: No ascites.  No free air. Musculoskeletal: Small paraumbilical hernia containing only mesenteric fat. Multilevel thoracolumbar spondylitic change. Superior endplate central compression deformity of L2 is new since prior study. IMPRESSION: 1. No acute findings. 2. 2.1 cm cystic lesion at the inferior margin of the uncinate process of the pancreas. Consider outpatient elective outpatient MRI/MRCP with contrast for further characterization. 3.  Aortic Atherosclerosis (ICD10-I70.0). Electronically Signed   By: Corlis Leak M.D.   On: 12/01/2022 11:15   DG Chest 2 View  Result Date: 12/01/2022 CLINICAL DATA:  Cough.  Sinus pain for several weeks. EXAM: CHEST - 2 VIEW COMPARISON:  05/02/2017 FINDINGS: Mild cardiac enlargement. Aortic atherosclerosis. Asymmetric elevation of right hemidiaphragm. No pleural fluid, interstitial edema, or airspace disease. Mild spondylosis within the thoracic spine. IMPRESSION: 1. No acute findings. 2. Mild cardiac enlargement. Electronically Signed   By: Signa Kell M.D.   On: 12/01/2022 10:32     Assessment/Plan There are no diagnoses linked to this encounter.   Family/ staff Communication: Reviewed plan of care with patient  Labs/tests ordered: None   Next Appointment:   Caesar Bookman, NP

## 2022-12-06 NOTE — Telephone Encounter (Signed)
I called and left detailed message for Clydie Braun and for her to call the office when available.

## 2022-12-10 ENCOUNTER — Other Ambulatory Visit: Payer: Medicare Other

## 2022-12-11 DIAGNOSIS — R350 Frequency of micturition: Secondary | ICD-10-CM | POA: Diagnosis not present

## 2022-12-11 DIAGNOSIS — N3020 Other chronic cystitis without hematuria: Secondary | ICD-10-CM | POA: Diagnosis not present

## 2022-12-15 ENCOUNTER — Other Ambulatory Visit: Payer: Self-pay | Admitting: Cardiovascular Disease

## 2022-12-16 ENCOUNTER — Ambulatory Visit: Payer: Medicare Other | Admitting: Family

## 2022-12-22 ENCOUNTER — Other Ambulatory Visit: Payer: Self-pay | Admitting: Family

## 2022-12-22 DIAGNOSIS — R0982 Postnasal drip: Secondary | ICD-10-CM

## 2022-12-25 DIAGNOSIS — N3020 Other chronic cystitis without hematuria: Secondary | ICD-10-CM | POA: Diagnosis not present

## 2023-01-06 ENCOUNTER — Encounter: Payer: Self-pay | Admitting: Family

## 2023-01-06 ENCOUNTER — Ambulatory Visit (INDEPENDENT_AMBULATORY_CARE_PROVIDER_SITE_OTHER): Payer: Medicare Other | Admitting: Family

## 2023-01-06 VITALS — BP 120/80 | HR 62 | Temp 98.0°F | Resp 16 | Ht 62.0 in | Wt 158.0 lb

## 2023-01-06 DIAGNOSIS — Z0000 Encounter for general adult medical examination without abnormal findings: Secondary | ICD-10-CM

## 2023-01-06 DIAGNOSIS — Z23 Encounter for immunization: Secondary | ICD-10-CM | POA: Diagnosis not present

## 2023-01-06 NOTE — Progress Notes (Signed)
Subjective:   Ashley Lawson is a 89 y.o. female who presents for Medicare Annual (Subsequent) preventive examination.  Visit Complete: In person  Patient Medicare AWV questionnaire was completed by the patient on 01/06/2023; I have confirmed that all information answered by patient is correct and no changes since this date.  Cardiac Risk Factors include: advanced age (>63men, >80 women);hypertension;sedentary lifestyle     Objective:    Today's Vitals   01/06/23 1255  BP: 120/80  Pulse: 62  Resp: 16  Temp: 98 F (36.7 C)  SpO2: 95%  Weight: 158 lb (71.7 kg)  Height: 5\' 2"  (1.575 m)   Body mass index is 28.9 kg/m.     01/06/2023   12:54 PM 12/06/2022    2:51 PM 12/01/2022    7:44 AM 11/27/2022   10:52 AM 08/21/2022    8:56 AM 05/06/2022    2:33 PM 04/22/2022    3:05 PM  Advanced Directives  Does Patient Have a Medical Advance Directive? Yes Yes Yes Yes Yes Yes Yes  Type of Advance Directive Living will Healthcare Power of [place];Living will Living will Living will Healthcare Power of [place];Living will;Out of facility DNR (pink MOST or yellow form) Healthcare Power of [employer];Living will Healthcare Power of Attorney  Does patient want to make changes to medical advance directive? Yes (Inpatient - patient defers changing a medical advance directive at this time - Information given) Yes (Inpatient - patient defers changing a medical advance directive at this time - Information given)   No - Patient declined  No - Patient declined  Copy of Healthcare Power of Attorney in Chart? Yes - validated most recent copy scanned in chart (See row information) Yes - validated most recent copy scanned in chart (See row information)    Yes - validated most recent copy scanned in chart (See row information) Yes - validated most recent copy scanned in chart (See row information)    Current Medications (verified) Outpatient Encounter Medications as of 01/06/2023  Medication Sig   acetaminophen  (TYLENOL) 500 MG tablet Take 500 mg by mouth at bedtime.   Ascorbic Acid (VITAMIN C) 100 MG tablet Take 500 mg by mouth daily.   benzonatate (TESSALON) 200 MG capsule Take 1 capsule (200 mg total) by mouth 2 (two) times daily as needed for cough.   calcium carbonate (OS-CAL) 1250 (500 Ca) MG chewable tablet Chew 1 tablet by mouth daily.    diltiazem (CARDIZEM CD) 120 MG 24 hr capsule TAKE 1 CAPSULE BY MOUTH EVERY DAY   ELIQUIS 5 MG TABS tablet TAKE 1 TABLET BY MOUTH TWICE A DAY   escitalopram (LEXAPRO) 10 MG tablet Take 1 tablet (10 mg total) by mouth at bedtime.   fluticasone (FLONASE) 50 MCG/ACT nasal spray INHALE 2 SPRAYS IN EACH NOSTRIL DAILY X 1 WEEK, THEN 1-2 SPRAYS DAILY. USE LOWEST POSSIBLE DOSE AFTER 1 WEEK   guaiFENesin (MUCINEX) 600 MG 12 hr tablet Take 600 mg by mouth daily as needed.   loratadine (CLARITIN) 10 MG tablet TAKE 1 TABLET BY MOUTH EVERY DAY   Multiple Vitamin (MULTIVITAMIN) tablet Take 1 tablet by mouth daily.   neomycin-polymyxin-hydrocortisone (CORTISPORIN) 3.5-10000-1 OTIC suspension Place 4 drops into the right ear 3 (three) times daily.   polyethylene glycol powder (GLYCOLAX/MIRALAX) 17 GM/SCOOP powder Take 17 g by mouth daily. Hold for loose stool   PREMARIN vaginal cream INSERT 1 GRAM VAGINALLY THREE TIMES A WEEKLY, THEN ONCE WEEKLY   Probiotic Product (ALIGN) 4 MG CAPS Take  1 capsule by mouth as needed.   XIIDRA 5 % SOLN Apply 1 drop to eye 2 (two) times daily.   No facility-administered encounter medications on file as of 01/06/2023.    Allergies (verified) Alendronate sodium, Penicillins, Ciprofloxacin, Amoxicillin, and Metronidazole   History: Past Medical History:  Diagnosis Date   Abdominal pain 12/2009   hospitalized   Allergic rhinitis    Anxiety    Bladder problem    Chronic constipation    Compression fx, lumbar spine (HCC)    DDD (degenerative disc disease), lumbar    Diverticulosis of colon    Dog bite(E906.0) 08/30/2011   GERD  (gastroesophageal reflux disease)    Hepatic steatosis    pt denies   Hyperlipidemia    Hyperplastic colon polyp    IBS (irritable bowel syndrome)    constipation predominant   Internal hemorrhoids    Longstanding persistent atrial fibrillation (HCC)    Mitral regurgitation    Osteoporosis    dexa 2011 -2.7 hip nl spine   PMR (polymyalgia rheumatica) (HCC) 08/30/2011   under rheum care  on low dose pred 5 mg    Renal disorder    Syncope    Tricuspid regurgitation    Past Surgical History:  Procedure Laterality Date   ABDOMINAL HYSTERECTOMY     with bladder tact and rectocele repair   BLADDER SURGERY     Streched   CATARACT EXTRACTION  2010   both   COLONOSCOPY  2016 was most recent   CYSTO WITH HYDRODISTENSION N/A 10/05/2019   Procedure: CYSTOSCOPY/HYDRODISTENSION;  Surgeon: Alfredo Martinez, MD;  Location: [facility];  Service: Urology;  Laterality: N/A;   TONSILLECTOMY AND ADENOIDECTOMY     Family History  Problem Relation Age of Onset   Stroke Mother 33   Liver disease Brother 65   Heart attack Father 51   Alzheimer's disease Sister 57   Bladder Cancer Sister 49   Crohn's disease Son    Colon cancer Neg Hx    Esophageal cancer Neg Hx    Stomach cancer Neg Hx    Rectal cancer Neg Hx    Liver cancer Neg Hx    Social History   Socioeconomic History   Marital status: Widowed    Spouse name: Not on file   Number of children: Not on file   Years of education: Not on file   Highest education level: Not on file  Occupational History   Occupation: retired  Tobacco Use   Smoking status: Never   Smokeless tobacco: Never  Vaping Use   Vaping status: Never Used  Substance and Sexual Activity   Alcohol use: Yes    Alcohol/week: 0.0 standard drinks of alcohol    Comment: occ   Drug use: Never   Sexual activity: Not Currently  Other Topics Concern   Not on file  Social History Narrative   Retired   Regular exercise- yes   Widowed   At home with children and GKs     HH of 2     Puppy   Plays cards is social and active       Diet:  No      Do you drink/ eat things with caffeine? Yes      Marital status:   Widow                            What year were you married ? [id number],[id number]  Do you live in a house, apartment,assistred living, condo, trailer, etc.)? Townhouse      Is it one or more stories? 2      How many persons live in your home ? 2      Do you have any pets in your home ?(please list)  None      Highest Level of education completed:  12 th Grade      Current or past profession: Retail Sales @ [employer]      Do you exercise?    Some                          Type & how often  Walking      ADVANCED DIRECTIVES (Please bring copies)      Do you have a living will? Yes      Do you have a DNR form?  Yes                     If not, do you want to discuss one?       Do you have signed POA?HPOA forms? Yes                If so, please bring to your appointment      FUNCTIONAL STATUS- To be completed by Spouse / child / Staff       Do you have difficulty bathing or dressing yourself ? No      Do you have difficulty preparing food or eating ?  No      Do you have difficulty managing your mediation ? No      Do you have difficulty managing your finances ? No      Do you have difficulty affording your medication ? No      Social Determinants of Health   Financial Resource Strain: Not on file  Food Insecurity: No Food Insecurity (12/05/2022)   Hunger Vital Sign    Worried About Running Out of Food in the Last Year: Never true    Ran Out of Food in the Last Year: Never true  Transportation Needs: No Transportation Needs (12/05/2022)   PRAPARE - Administrator, Civil Service (Medical): No    Lack of Transportation (Non-Medical): No  Physical Activity: Not on file  Stress: Not on file  Social Connections: Not on file    Tobacco Counseling Counseling given: Not Answered   Clinical Intake:  Pre-visit preparation  completed: No  Pain : No/denies pain     BMI - recorded: 28.9 Nutritional Status: BMI 25 -29 Overweight Nutritional Risks: None Diabetes: No  How often do you need to have someone help you when you read instructions, pamphlets, or other written materials from your doctor or pharmacy?: 1 - Never What is the last grade level you completed in school?: 12 grade  Interpreter Needed?: No      Activities of Daily Living    01/06/2023    1:19 PM  In your present state of health, do you have any difficulty performing the following activities:  Hearing? 0  Vision? 0  Difficulty concentrating or making decisions? 1  Comment remembering  Walking or climbing stairs? 0  Dressing or bathing? 0  Doing errands, shopping? 1  Comment daughter Ship broker and eating ? Y  Comment dsughter assist  Using the Toilet? N  In the past six months, have you accidently leaked urine? N  Do you have problems with loss of bowel control? N  Managing your Medications? Y  Comment daughter assist  Managing your Finances? Y  Comment daughter assist  Housekeeping or managing your Housekeeping? Y  Comment daughter assist    Patient Care Team: Collins Kerby, Donalee Citrin, NP as PCP - General (Family Medicine) Kathleene Hazel, MD as PCP - Cardiology (Cardiology) Alfredo Martinez, MD (Urology) Alanson Puls, MD (Dermatology) Maris Berger, MD (Ophthalmology) Lunette Stands, MD (Orthopedic Surgery) Dareen Piano  (Rheumatology) Donzetta Starch, MD as Consulting Physician (Dermatology) Iva Boop, MD as Consulting Physician (Gastroenterology) Drema Halon, MD (Inactive) as Consulting Physician (Otolaryngology)  Indicate any recent Medical Services you may have received from other than [facility] providers in the past year (date may be approximate).     Assessment:   This is a routine wellness examination for Nayara.  Hearing/Vision screen No results found.   Goals Addressed              This Visit's Progress    Patient Stated       - stay independent       Depression Screen    01/06/2023   12:54 PM 12/06/2022    2:52 PM 11/27/2022   10:52 AM 11/22/2022    1:07 PM 08/21/2022    8:56 AM 03/22/2022   12:50 PM 01/01/2022   12:54 PM  PHQ 2/9 Scores  PHQ - 2 Score 0 0 0 0 0 0 0  PHQ- 9 Score    0     Exception Documentation       Other- indicate reason in comment box  Not completed       for annual wellness    Fall Risk    01/06/2023   12:54 PM 12/06/2022    2:52 PM 11/27/2022   10:51 AM 11/22/2022    1:06 PM 11/13/2022    2:53 PM  Fall Risk   Falls in the past year? 0 0 0 0 0  Number falls in past yr:    0 0  Injury with Fall?    0 0  Risk for fall due to :    No Fall Risks No Fall Risks  Follow up    Falls evaluation completed Falls evaluation completed    MEDICARE RISK AT HOME: Medicare Risk at Home Any stairs in or around the home?: Yes If so, are there any without handrails?: No Home free of loose throw rugs in walkways, pet beds, electrical cords, etc?: No Adequate lighting in your home to reduce risk of falls?: Yes Life alert?: No Use of a cane, walker or w/c?: No Grab bars in the bathroom?: No Shower chair or bench in shower?: Yes Elevated toilet seat or a handicapped toilet?: Yes  TIMED UP AND GO:  Was the test performed?  Yes  Length of time to ambulate 10 feet: 20 sec Gait slow and steady without use of assistive device    Cognitive Function:    01/06/2023   12:57 PM 01/10/2020    4:43 PM  MMSE - Mini Mental State Exam  Orientation to time 2 5  Orientation to Place 5 5  Registration 3 3  Attention/ Calculation 3 5  Recall 0 3  Language- name 2 objects 2 2  Language- repeat 1 1  Language- follow 3 step command 3 3  Language- read & follow direction 1 1  Write a sentence 1 1  Copy design 0 0  Copy design-comments  no to clock  Total score 21 29        01/01/2022   12:56 PM 12/13/2020    3:12 PM  6CIT Screen  What Year? 0  points 0 points  What month? 0 points 0 points  What time? 0 points 0 points  Count back from 20 0 points 0 points  Months in reverse 0 points 0 points  Repeat phrase 0 points 2 points  Total Score 0 points 2 points    Immunizations Immunization History  Administered Date(s) Administered   Fluad Quad(high Dose 65+) 01/10/2020, 12/10/2021   Fluad Trivalent(High Dose 65+) 01/06/2023   Influenza Split 01/25/2011, 01/23/2012   Influenza Whole 12/31/2007   Influenza, High Dose Seasonal PF 01/19/2013, 01/10/2014, 01/17/2015, 01/18/2016, 01/02/2017, 01/05/2018, 01/05/2019, 01/05/2019, 02/07/2021   Influenza-Unspecified 01/02/2017   Moderna Sars-Covid-2 Vaccination 02/15/2022   PFIZER(Purple Top)SARS-COV-2 Vaccination 05/07/2019, 06/02/2019, 01/31/2020, 08/02/2020   Pfizer Covid-19 Vaccine Bivalent Booster 48yrs & up 02/19/2021   Pneumococcal Conjugate-13 02/23/2013   Pneumococcal Polysaccharide-23 04/01/2006, 04/01/2018   Td 04/01/2005   Td (Adult) 11/22/2016   Tdap 08/30/2011, 08/03/2021   Zoster Recombinant(Shingrix) 12/04/2016, 07/27/2017   Zoster, Live 04/02/2007    TDAP status: Up to date  Flu Vaccine status: Due, Education has been provided regarding the importance of this vaccine. Advised may receive this vaccine at local pharmacy or Health Dept. Aware to provide a copy of the vaccination record if obtained from local pharmacy or Health Dept. Verbalized acceptance and understanding.  Pneumococcal vaccine status: Up to date  Covid-19 vaccine status: Information provided on how to obtain vaccines.   Qualifies for Shingles Vaccine? Yes   Zostavax completed No   Shingrix Completed?: Yes  Screening Tests Health Maintenance  Topic Date Due   MAMMOGRAM  09/09/2022   COVID-19 Vaccine (7 - 2023-24 season) 12/01/2022   Medicare Annual Wellness (AWV)  01/06/2024   DTaP/Tdap/Td (5 - Td or Tdap) 08/04/2031   Pneumonia Vaccine 9+ Years old  Completed   INFLUENZA VACCINE   Completed   DEXA SCAN  Completed   Zoster Vaccines- Shingrix  Completed   HPV VACCINES  Aged Out    Health Maintenance  Health Maintenance Due  Topic Date Due   MAMMOGRAM  09/09/2022   COVID-19 Vaccine (7 - 2023-24 season) 12/01/2022    Colorectal cancer screening: No longer required.   Mammogram status: No longer required due to advance age .  Bone Density status: Completed 09/19/2019. Results reflect: Bone density results: OSTEOPOROSIS. Repeat every 5 years.  Lung Cancer Screening: (Low Dose CT Chest recommended if Age 46-80 years, 20 pack-year currently smoking OR have quit w/in 15years.) does not qualify.   Lung Cancer Screening Referral: No   Additional Screening:  Hepatitis C Screening: does not qualify; Completed N/A  Vision Screening: Recommended annual ophthalmology exams for early detection of glaucoma and other disorders of the eye. Is the patient up to date with their annual eye exam?  Yes  Who is the provider or what is the name of the office in which the patient attends annual eye exams? [facility]  If pt is not established with a provider, would they like to be referred to a provider to establish care? No .   Dental Screening: Recommended annual dental exams for proper oral hygiene  Diabetic Foot Exam: Diabetic Foot Exam: Completed N/A  Community Resource Referral / Chronic Care Management: CRR required this visit?  No   CCM required this visit?  No     Plan:  I have personally reviewed and noted the following in the patient's chart:   Medical and social history Use of alcohol, tobacco or illicit drugs  Current medications and supplements including opioid prescriptions. Patient is not currently taking opioid prescriptions. Functional ability and status Nutritional status Physical activity Advanced directives List of other physicians Hospitalizations, surgeries, and ER visits in previous 12 months Vitals Screenings to include  cognitive, depression, and falls Referrals and appointments  In addition, I have reviewed and discussed with patient certain preventive protocols, quality metrics, and best practice recommendations. A written personalized care plan for preventive services as well as general preventive health recommendations were provided to patient.     Caesar Bookman, NP   01/06/2023   After Visit Summary: (In Person-Declined) Patient declined AVS at this time.  Nurse Notes: Advised to get COVID-19 vaccine the pharmacy

## 2023-01-06 NOTE — Patient Instructions (Addendum)
[name] , Thank you for taking time to come for your Medicare Wellness Visit. I appreciate your ongoing commitment to your health goals. Please review the following plan we discussed and let me know if I can assist you in the future.   Screening recommendations/referrals: Colonoscopy : N/A  Mammogram : N/A  Bone Density : Up to date  Recommended yearly ophthalmology/optometry visit for glaucoma screening and checkup Recommended yearly dental visit for hygiene and checkup  Vaccinations: Influenza vaccine- due annually in September/October Pneumococcal vaccine : Up to date  Tdap vaccine : Up to date  Shingles vaccine : Up to date     Advanced directives: yes   Conditions/risks identified: advanced age (>56men, >49 women);hypertension;sedentary lifestyle  Next appointment: 1 year    Preventive Care 55 Years and Older, Female Preventive care refers to lifestyle choices and visits with your health care provider that can promote health and wellness. What does preventive care include? A yearly physical exam. This is also called an annual well check. Dental exams once or twice a year. Routine eye exams. Ask your health care provider how often you should have your eyes checked. Personal lifestyle choices, including: Daily care of your teeth and gums. Regular physical activity. Eating a healthy diet. Avoiding tobacco and drug use. Limiting alcohol use. Practicing safe sex. Taking low-dose aspirin every day. Taking vitamin and mineral supplements as recommended by your health care provider. What happens during an annual well check? The services and screenings done by your health care provider during your annual well check will depend on your age, overall health, lifestyle risk factors, and family history of disease. Counseling  Your health care provider may ask you questions about your: Alcohol use. Tobacco use. Drug use. Emotional well-being. Home and relationship  well-being. Sexual activity. Eating habits. History of falls. Memory and ability to understand (cognition). Work and work Astronomer. Reproductive health. Screening  You may have the following tests or measurements: Height, weight, and BMI. Blood pressure. Lipid and cholesterol levels. These may be checked every 5 years, or more frequently if you are over 46 years old. Skin check. Lung cancer screening. You may have this screening every year starting at age 66 if you have a 30-pack-year history of smoking and currently smoke or have quit within the past 15 years. Fecal occult blood test (FOBT) of the stool. You may have this test every year starting at age 1. Flexible sigmoidoscopy or colonoscopy. You may have a sigmoidoscopy every 5 years or a colonoscopy every 10 years starting at age 47. Hepatitis C blood test. Hepatitis B blood test. Sexually transmitted disease (STD) testing. Diabetes screening. This is done by checking your blood sugar (glucose) after you have not eaten for a while (fasting). You may have this done every 1-3 years. Bone density scan. This is done to screen for osteoporosis. You may have this done starting at age 80. Mammogram. This may be done every 1-2 years. Talk to your health care provider about how often you should have regular mammograms. Talk with your health care provider about your test results, treatment options, and if necessary, the need for more tests. Vaccines  Your health care provider may recommend certain vaccines, such as: Influenza vaccine. This is recommended every year. Tetanus, diphtheria, and acellular pertussis (Tdap, Td) vaccine. You may need a Td booster every 10 years. Zoster vaccine. You may need this after age 52. Pneumococcal 13-valent conjugate (PCV13) vaccine. One dose is recommended after age 79. Pneumococcal polysaccharide (PPSV23)  vaccine. One dose is recommended after age 50. Talk to your health care provider about which  screenings and vaccines you need and how often you need them. This information is not intended to replace advice given to you by your health care provider. Make sure you discuss any questions you have with your health care provider. Document Released: 04/14/2015 Document Revised: 12/06/2015 Document Reviewed: 01/17/2015 Elsevier Interactive Patient Education  2017 [employer].  Fall Prevention in the Home Falls can cause injuries. They can happen to people of all ages. There are many things you can do to make your home safe and to help prevent falls. What can I do on the outside of my home? Regularly fix the edges of walkways and driveways and fix any cracks. Remove anything that might make you trip as you walk through a door, such as a raised step or threshold. Trim any bushes or trees on the path to your home. Use bright outdoor lighting. Clear any walking paths of anything that might make someone trip, such as rocks or tools. Regularly check to see if handrails are loose or broken. Make sure that both sides of any steps have handrails. Any raised decks and porches should have guardrails on the edges. Have any leaves, snow, or ice cleared regularly. Use sand or salt on walking paths during winter. Clean up any spills in your garage right away. This includes oil or grease spills. What can I do in the bathroom? Use night lights. Install grab bars by the toilet and in the tub and shower. Do not use towel bars as grab bars. Use non-skid mats or decals in the tub or shower. If you need to sit down in the shower, use a plastic, non-slip stool. Keep the floor dry. Clean up any water that spills on the floor as soon as it happens. Remove soap buildup in the tub or shower regularly. Attach bath mats securely with double-sided non-slip rug tape. Do not have throw rugs and other things on the floor that can make you trip. What can I do in the bedroom? Use night lights. Make sure that you have a  light by your bed that is easy to reach. Do not use any sheets or blankets that are too big for your bed. They should not hang down onto the floor. Have a firm chair that has side arms. You can use this for support while you get dressed. Do not have throw rugs and other things on the floor that can make you trip. What can I do in the kitchen? Clean up any spills right away. Avoid walking on wet floors. Keep items that you use a lot in easy-to-reach places. If you need to reach something above you, use a strong step stool that has a grab bar. Keep electrical cords out of the way. Do not use floor polish or wax that makes floors slippery. If you must use wax, use non-skid floor wax. Do not have throw rugs and other things on the floor that can make you trip. What can I do with my stairs? Do not leave any items on the stairs. Make sure that there are handrails on both sides of the stairs and use them. Fix handrails that are broken or loose. Make sure that handrails are as long as the stairways. Check any carpeting to make sure that it is firmly attached to the stairs. Fix any carpet that is loose or worn. Avoid having throw rugs at the top or bottom  of the stairs. If you do have throw rugs, attach them to the floor with carpet tape. Make sure that you have a light switch at the top of the stairs and the bottom of the stairs. If you do not have them, ask someone to add them for you. What else can I do to help prevent falls? Wear shoes that: Do not have high heels. Have rubber bottoms. Are comfortable and fit you well. Are closed at the toe. Do not wear sandals. If you use a stepladder: Make sure that it is fully opened. Do not climb a closed stepladder. Make sure that both sides of the stepladder are locked into place. Ask someone to hold it for you, if possible. Clearly mark and make sure that you can see: Any grab bars or handrails. First and last steps. Where the edge of each step  is. Use tools that help you move around (mobility aids) if they are needed. These include: Canes. Walkers. Scooters. Crutches. Turn on the lights when you go into a dark area. Replace any light bulbs as soon as they burn out. Set up your furniture so you have a clear path. Avoid moving your furniture around. If any of your floors are uneven, fix them. If there are any pets around you, be aware of where they are. Review your medicines with your doctor. Some medicines can make you feel dizzy. This can increase your chance of falling. Ask your doctor what other things that you can do to help prevent falls. This information is not intended to replace advice given to you by your health care provider. Make sure you discuss any questions you have with your health care provider. Document Released: 01/12/2009 Document Revised: 08/24/2015 Document Reviewed: 04/22/2014 Elsevier Interactive Patient Education  2017 [employer].

## 2023-01-15 DIAGNOSIS — L84 Corns and callosities: Secondary | ICD-10-CM | POA: Diagnosis not present

## 2023-01-24 ENCOUNTER — Ambulatory Visit
Admission: RE | Admit: 2023-01-24 | Discharge: 2023-01-24 | Disposition: A | Discharge status: Home / Self Care | Payer: Medicare Other | Source: Ambulatory Visit | Attending: Family | Admitting: Family

## 2023-01-24 DIAGNOSIS — K862 Cyst of pancreas: Secondary | ICD-10-CM | POA: Diagnosis not present

## 2023-01-24 DIAGNOSIS — I700 Atherosclerosis of aorta: Secondary | ICD-10-CM | POA: Diagnosis not present

## 2023-01-24 DIAGNOSIS — K8689 Other specified diseases of pancreas: Secondary | ICD-10-CM | POA: Diagnosis not present

## 2023-01-24 MED ORDER — GADOPICLENOL 0.5 MMOL/ML IV SOLN
7.5000 mL | Freq: Once | INTRAVENOUS | Status: AC | PRN
  Administered 2023-01-24: 7.5 mL via INTRAVENOUS

## 2023-02-05 DIAGNOSIS — D692 Other nonthrombocytopenic purpura: Secondary | ICD-10-CM | POA: Diagnosis not present

## 2023-02-05 DIAGNOSIS — C44722 Squamous cell carcinoma of skin of right lower limb, including hip: Secondary | ICD-10-CM | POA: Diagnosis not present

## 2023-02-05 DIAGNOSIS — L821 Other seborrheic keratosis: Secondary | ICD-10-CM | POA: Diagnosis not present

## 2023-02-05 DIAGNOSIS — Z85828 Personal history of other malignant neoplasm of skin: Secondary | ICD-10-CM | POA: Diagnosis not present

## 2023-02-05 DIAGNOSIS — D1801 Hemangioma of skin and subcutaneous tissue: Secondary | ICD-10-CM | POA: Diagnosis not present

## 2023-02-05 DIAGNOSIS — L570 Actinic keratosis: Secondary | ICD-10-CM | POA: Diagnosis not present

## 2023-02-07 ENCOUNTER — Other Ambulatory Visit: Payer: Self-pay

## 2023-02-07 DIAGNOSIS — E785 Hyperlipidemia, unspecified: Secondary | ICD-10-CM

## 2023-02-07 DIAGNOSIS — I480 Paroxysmal atrial fibrillation: Secondary | ICD-10-CM

## 2023-02-07 MED ORDER — ROSUVASTATIN CALCIUM 5 MG PO TABS: 5.0000 mg | ORAL_TABLET | Freq: Every day | ORAL | 0 refills | Status: DC

## 2023-02-12 ENCOUNTER — Emergency Department (HOSPITAL_COMMUNITY)
Admission: EM | Admit: 2023-02-12 | Discharge: 2023-02-12 | Disposition: A | Discharge status: Home / Self Care | Payer: Medicare Other | Attending: Emergency Medicine | Admitting: Emergency Medicine

## 2023-02-12 ENCOUNTER — Emergency Department (HOSPITAL_COMMUNITY): Payer: Medicare Other

## 2023-02-12 ENCOUNTER — Encounter (HOSPITAL_COMMUNITY): Payer: Self-pay | Admitting: Emergency Medicine

## 2023-02-12 ENCOUNTER — Other Ambulatory Visit: Payer: Self-pay

## 2023-02-12 ENCOUNTER — Other Ambulatory Visit: Payer: Medicare Other

## 2023-02-12 DIAGNOSIS — S0990XA Unspecified injury of head, initial encounter: Secondary | ICD-10-CM | POA: Diagnosis not present

## 2023-02-12 DIAGNOSIS — W010XXA Fall on same level from slipping, tripping and stumbling without subsequent striking against object, initial encounter: Secondary | ICD-10-CM | POA: Insufficient documentation

## 2023-02-12 DIAGNOSIS — S60221A Contusion of right hand, initial encounter: Secondary | ICD-10-CM | POA: Diagnosis not present

## 2023-02-12 DIAGNOSIS — M19041 Primary osteoarthritis, right hand: Secondary | ICD-10-CM | POA: Diagnosis not present

## 2023-02-12 DIAGNOSIS — S0003XA Contusion of scalp, initial encounter: Secondary | ICD-10-CM | POA: Insufficient documentation

## 2023-02-12 DIAGNOSIS — E785 Hyperlipidemia, unspecified: Secondary | ICD-10-CM | POA: Diagnosis not present

## 2023-02-12 DIAGNOSIS — Y92002 Bathroom of unspecified non-institutional (private) residence single-family (private) house as the place of occurrence of the external cause: Secondary | ICD-10-CM | POA: Diagnosis not present

## 2023-02-12 DIAGNOSIS — M85841 Other specified disorders of bone density and structure, right hand: Secondary | ICD-10-CM | POA: Diagnosis not present

## 2023-02-12 DIAGNOSIS — Z7901 Long term (current) use of anticoagulants: Secondary | ICD-10-CM | POA: Insufficient documentation

## 2023-02-12 DIAGNOSIS — I4891 Unspecified atrial fibrillation: Secondary | ICD-10-CM | POA: Insufficient documentation

## 2023-02-12 DIAGNOSIS — S6991XA Unspecified injury of right wrist, hand and finger(s), initial encounter: Secondary | ICD-10-CM | POA: Diagnosis not present

## 2023-02-12 DIAGNOSIS — I480 Paroxysmal atrial fibrillation: Secondary | ICD-10-CM | POA: Diagnosis not present

## 2023-02-12 NOTE — ED Provider Notes (Signed)
[place] EMERGENCY DEPARTMENT AT [facility] Provider Note   CSN: [id number] Arrival date & time: 02/12/23  1930     History  Chief Complaint  Patient presents with   Fall   Hand Injury    Ashley Lawson is a 89 y.o. female history of A-fib on Eliquis, here presenting with fall.  Patient states that she was walking to the bathroom and her feet got twisted up and she hit her head and also fell on her hand.  Patient denies any lacerations.  Denies any dizziness.  Does not want any pain medicine currently  The history is provided by the patient.       Home Medications Prior to Admission medications   Medication Sig Start Date End Date Taking? Authorizing Provider  acetaminophen (TYLENOL) 500 MG tablet Take 500 mg by mouth at bedtime.    [provider]  Ascorbic Acid (VITAMIN C) 100 MG tablet Take 500 mg by mouth daily.    [provider]  benzonatate (TESSALON) 200 MG capsule Take 1 capsule (200 mg total) by mouth 2 (two) times daily as needed for cough. 11/13/22   Venita Sheffield, MD  calcium carbonate (OS-CAL) 1250 (500 Ca) MG chewable tablet Chew 1 tablet by mouth daily.     [provider]  diltiazem (CARDIZEM CD) 120 MG 24 hr capsule TAKE 1 CAPSULE BY MOUTH EVERY DAY 12/16/22   Kathleene Hazel, MD  ELIQUIS 5 MG TABS tablet TAKE 1 TABLET BY MOUTH TWICE A DAY 08/09/22   Kathleene Hazel, MD  escitalopram (LEXAPRO) 10 MG tablet Take 1 tablet (10 mg total) by mouth at bedtime. 11/22/22   Ngetich, Dinah C, NP  fluticasone (FLONASE) 50 MCG/ACT nasal spray INHALE 2 SPRAYS IN EACH NOSTRIL DAILY X 1 WEEK, THEN 1-2 SPRAYS DAILY. USE LOWEST POSSIBLE DOSE AFTER 1 WEEK 08/16/22   Ngetich, Dinah C, NP  guaiFENesin (MUCINEX) 600 MG 12 hr tablet Take 600 mg by mouth daily as needed.    [provider]  loratadine (CLARITIN) 10 MG tablet TAKE 1 TABLET BY MOUTH EVERY DAY 12/23/22   Ngetich, Dinah C, NP  Multiple Vitamin  (MULTIVITAMIN) tablet Take 1 tablet by mouth daily.    [provider]  neomycin-polymyxin-hydrocortisone (CORTISPORIN) 3.5-10000-1 OTIC suspension Place 4 drops into the right ear 3 (three) times daily. 11/27/22   Venita Sheffield, MD  polyethylene glycol powder (GLYCOLAX/MIRALAX) 17 GM/SCOOP powder Take 17 g by mouth daily. Hold for loose stool 04/26/20   Ngetich, Donalee Citrin, NP  PREMARIN vaginal cream INSERT 1 GRAM VAGINALLY THREE TIMES A WEEKLY, THEN ONCE WEEKLY 08/23/19   [provider]  Probiotic Product (ALIGN) 4 MG CAPS Take 1 capsule by mouth as needed.    [provider]  rosuvastatin (CRESTOR) 5 MG tablet Take 1 tablet (5 mg total) by mouth daily. 02/07/23   Ngetich, Dinah C, NP  XIIDRA 5 % SOLN Apply 1 drop to eye 2 (two) times daily. 08/01/22   [provider]      Allergies    Alendronate sodium, Penicillins, Ciprofloxacin, Amoxicillin, and Metronidazole    Review of Systems   Review of Systems  Neurological:  Positive for headaches.  All other systems reviewed and are negative.   Physical Exam Updated Vital Signs BP 122/66   Pulse 61   Temp (!) 97.5 F (36.4 C) (Oral)   Resp 18   Ht 5\' 2"  (1.575 m)   Wt 71.7 kg   SpO2  97%   BMI 28.90 kg/m  Physical Exam Vitals and nursing note reviewed.  Constitutional:      Appearance: Normal appearance.  HENT:     Head:     Comments: Small right scalp hematoma but no obvious laceration    Nose: Nose normal.     Mouth/Throat:     Mouth: Mucous membranes are moist.  Eyes:     Extraocular Movements: Extraocular movements intact.     Pupils: Pupils are equal, round, and reactive to light.  Neck:     Comments: No midline tenderness  Cardiovascular:     Rate and Rhythm: Normal rate and regular rhythm.     Pulses: Normal pulses.  Pulmonary:     Effort: Pulmonary effort is normal.     Breath sounds: Normal breath sounds.  Abdominal:     General: Abdomen is flat.     Palpations: Abdomen is  soft.  Musculoskeletal:     Cervical back: Normal range of motion and neck supple.     Comments: Mild tenderness of the right hand but able to range her wrist normally.  No obvious forearm or elbow or humerus deformity.  No spinal tenderness.  Patient has normal range of motion bilateral hips.  No other extremity trauma  Skin:    General: Skin is warm.  Neurological:     General: No focal deficit present.     Mental Status: She is alert and oriented to person, place, and time.  Psychiatric:        Mood and Affect: Mood normal.        Behavior: Behavior normal.     ED Results / Procedures / Treatments   Labs (all labs ordered are listed, but only abnormal results are displayed) Labs Reviewed - No data to display  EKG None  Radiology DG Hand 2 View Right  Result Date: 02/12/2023 CLINICAL DATA:  Initial evaluation for acute trauma, fall. EXAM: RIGHT HAND - 2 VIEW COMPARISON:  None Available. FINDINGS: Bones are diffusely osteopenic, somewhat limiting assessment for fine osseous detail. No acute fracture or dislocation. Scattered osteoarthritic changes noted about the hand, most notably at the first D IP articulation. No discrete osseous lesions. No visible soft tissue injury. IMPRESSION: No acute osseous abnormality within the right hand. Electronically Signed   By: Rise Mu M.D.   On: 02/12/2023 20:52   CT Head Wo Contrast  Result Date: 02/12/2023 CLINICAL DATA:  Initial evaluation for acute head trauma, minor. EXAM: CT HEAD WITHOUT CONTRAST TECHNIQUE: Contiguous axial images were obtained from the base of the skull through the vertex without intravenous contrast. RADIATION DOSE REDUCTION: This exam was performed according to the departmental dose-optimization program which includes automated exposure control, adjustment of the mA and/or kV according to patient size and/or use of iterative reconstruction technique. COMPARISON:  Prior study from 08/03/2021. FINDINGS: Brain:  Generalized age-related cerebral atrophy with moderate chronic microvascular ischemic disease. No acute intracranial hemorrhage. No acute large vessel territory infarct. No mass lesion or midline shift. Diffuse ventricular prominence most like related global parenchymal volume loss without hydrocephalus. No extra-axial fluid collection. Vascular: No abnormal hyperdense vessel. Calcified atherosclerosis present at the skull base. Skull: Scalp soft tissues demonstrate no acute finding. Calvarium intact. Sinuses/Orbits: Globes and orbital soft tissues within normal limits. Ocular senescent calcifications noted. Paranasal sinuses are clear. No mastoid effusion. Other: None. IMPRESSION: 1. No acute intracranial abnormality. 2. Age-related cerebral atrophy with moderate chronic microvascular ischemic disease. Electronically Signed   By: Sharlet Salina  Phill Myron M.D.   On: 02/12/2023 20:50    Procedures Procedures    Medications Ordered in ED Medications - No data to display  ED Course/ Medical Decision Making/ A&P                                 Medical Decision Making Ashley Lawson is a 89 y.o. female here presenting with fall with head injury.  Patient is on Eliquis and hit her head.  CT head did not show any bleed.  Patient also injured her hand and x-ray of the hand was reviewed by me and there is no fractures.  At this point patient is stable for discharge.   Problems Addressed: Contusion of right hand, initial encounter: acute illness or injury Contusion of scalp, initial encounter: acute illness or injury  Amount and/or Complexity of Data Reviewed Radiology: ordered and independent interpretation performed. Decision-making details documented in ED Course.    Final Clinical Impression(s) / ED Diagnoses Final diagnoses:  None    Rx / DC Orders ED Discharge Orders     None         Charlynne Pander, MD 02/12/23 2122

## 2023-02-12 NOTE — ED Triage Notes (Signed)
  Patient comes in with mechanical fall that occurred around 1800.  Patient states she was ambulating to bathroom and got feet twisted up and felt on carpeted floor.  Patient states she landed on R hand and hit R upper part of head on floor.  Patient does take eliquis.  Pain 6/10,aching.  Endorses R hand pain but no headache or AMS.

## 2023-02-12 NOTE — Discharge Instructions (Signed)
As we discussed the CT of your head as well as your x-rays of your hand did not show any fractures or bleeding  Take Tylenol as needed for pain  See your doctor for follow-up  Return to ER if you have severe headache or vomiting or severe pain

## 2023-02-12 NOTE — ED Notes (Signed)
9:46 PM  Discharge instructions discussed with patient and family. Patient and family voice understanding of discharge instructions and deny any questions or concerns regarding discharge. Patient is stable at discharge and in NAD. Vital signs updated. Patient's daughter to drive patient home.

## 2023-02-13 LAB — LIPID PANEL
Cholesterol: 149 mg/dL (ref <200)
HDL: 55 mg/dL (ref >=50)
LDL Cholesterol (Calc): 73 mg/dL
Non-HDL Cholesterol (Calc): 94 mg/dL (ref <130)
Total CHOL/HDL Ratio: 2.7 (calc) (ref <5.0)
Triglycerides: 119 mg/dL (ref <150)

## 2023-02-13 LAB — COMPLETE METABOLIC PANEL WITH GFR
AG Ratio: 1.6 (calc) (ref 1.0–2.5)
ALT: 14 U/L (ref 6–29)
AST: 18 U/L (ref 10–35)
Albumin: 4.1 g/dL (ref 3.6–5.1)
Alkaline phosphatase (APISO): 70 U/L (ref 37–153)
BUN: 11 mg/dL (ref 7–25)
CO2: 28 mmol/L (ref 20–32)
Calcium: 9.1 mg/dL (ref 8.6–10.4)
Chloride: 106 mmol/L (ref 98–110)
Creat: 0.92 mg/dL (ref 0.60–0.95)
Globulin: 2.5 g/dL (ref 1.9–3.7)
Glucose, Bld: 86 mg/dL (ref 65–99)
Potassium: 4.9 mmol/L (ref 3.5–5.3)
Sodium: 141 mmol/L (ref 135–146)
Total Bilirubin: 0.7 mg/dL (ref 0.2–1.2)
Total Protein: 6.6 g/dL (ref 6.1–8.1)
eGFR: 60 mL/min/{1.73_m2} (ref >=60)

## 2023-02-13 LAB — CBC WITH DIFFERENTIAL/PLATELET
Absolute Lymphocytes: 1749 {cells}/uL (ref 850–3900)
Absolute Monocytes: 581 {cells}/uL (ref 200–950)
Basophils Absolute: 73 {cells}/uL (ref 0–200)
Basophils Relative: 1.1 %
Eosinophils Absolute: 152 {cells}/uL (ref 15–500)
Eosinophils Relative: 2.3 %
HCT: 38.4 % (ref 35.0–45.0)
Hemoglobin: 12.3 g/dL (ref 11.7–15.5)
MCH: 31.4 pg (ref 27.0–33.0)
MCHC: 32 g/dL (ref 32.0–36.0)
MCV: 98 fL (ref 80.0–100.0)
MPV: 9.4 fL (ref 7.5–12.5)
Monocytes Relative: 8.8 %
Neutro Abs: 4046 {cells}/uL (ref 1500–7800)
Neutrophils Relative %: 61.3 %
Platelets: 233 10*3/uL (ref 140–400)
RBC: 3.92 10*6/uL (ref 3.80–5.10)
RDW: 12.9 % (ref 11.0–15.0)
Total Lymphocyte: 26.5 %
WBC: 6.6 10*3/uL (ref 3.8–10.8)

## 2023-02-13 LAB — TSH: TSH: 4.11 m[IU]/L (ref 0.40–4.50)

## 2023-02-14 ENCOUNTER — Other Ambulatory Visit: Payer: Medicare Other

## 2023-02-19 ENCOUNTER — Ambulatory Visit: Payer: Medicare Other | Admitting: Family

## 2023-02-24 ENCOUNTER — Ambulatory Visit (INDEPENDENT_AMBULATORY_CARE_PROVIDER_SITE_OTHER): Payer: Medicare Other | Admitting: Family

## 2023-02-24 ENCOUNTER — Encounter: Payer: Self-pay | Admitting: Family

## 2023-02-24 VITALS — BP 118/70 | HR 79 | Temp 97.1°F | Resp 20 | Ht 62.0 in | Wt 160.2 lb

## 2023-02-24 DIAGNOSIS — I480 Paroxysmal atrial fibrillation: Secondary | ICD-10-CM | POA: Diagnosis not present

## 2023-02-24 DIAGNOSIS — M150 Primary generalized (osteo)arthritis: Secondary | ICD-10-CM

## 2023-02-24 DIAGNOSIS — M79641 Pain in right hand: Secondary | ICD-10-CM | POA: Diagnosis not present

## 2023-02-24 DIAGNOSIS — N1831 Chronic kidney disease, stage 3a: Secondary | ICD-10-CM

## 2023-02-24 DIAGNOSIS — F411 Generalized anxiety disorder: Secondary | ICD-10-CM

## 2023-02-24 DIAGNOSIS — E785 Hyperlipidemia, unspecified: Secondary | ICD-10-CM | POA: Diagnosis not present

## 2023-02-24 NOTE — Progress Notes (Unsigned)
Provider: Richarda Blade FNP-C   Jeanette Moffatt, Donalee Citrin, NP  Patient Care Team: Jovonta Levit, Donalee Citrin, NP as PCP - General (Family Medicine) Kathleene Hazel, MD as PCP - Cardiology (Cardiology) Alfredo Martinez, MD (Urology) Alanson Puls, MD (Dermatology) Maris Berger, MD (Ophthalmology) Lunette Stands, MD (Orthopedic Surgery) Dareen Piano  (Rheumatology) Donzetta Starch, MD as Consulting Physician (Dermatology) Iva Boop, MD as Consulting Physician (Gastroenterology) Drema Halon, MD (Inactive) as Consulting Physician (Otolaryngology)  Extended Emergency Contact Information Primary Emergency Contact: Apple,Karen Address: [address]          [place] [address] [place] Home Phone: [phone] Mobile Phone: [phone] Relation: Daughter  Code Status:  Full Code  Goals of care: Advanced Directive information    02/12/2023    7:42 PM  Advanced Directives  Does Patient Have a Medical Advance Directive? Yes  Type of Estate agent of [place];Living will;Out of facility DNR (pink MOST or yellow form)  Copy of Healthcare Power of Attorney in Chart? No - copy requested     Chief Complaint  Patient presents with   Medical Management of Chronic Issues    Patient presents today for a 6 month follow-up   Quality Metric Gaps    mammogram    HPI:  Pt is a 89 y.o. female seen today for 6 months follow up for medical management of chronic diseases. She is here with her daughter Graciela Husbands who provides additional HPI information. State was seen in the ED 02/12/2023 after sustaining a fall at home while walking to the bathroom and her feet got twisted and felt.she hit her head and fell on her right hand.  She was noted to have mild tenderness on the right hand but no difficulty with range of motion.  X-ray of the right showed no fracture.  On Eliquis for A-fib. CT of the head but did not show any bleeding.  CBC with differential  and CMP done in the ED were unremarkable. her condition was stable was discharged home. States swelling and bruise on right hand has improved but still has slight soreness. Also had recent fasting lab work which were discussed and reviewed during visit.  All labs were within normal range. Has had 2 lbs weight gain since last visit.states not doing exercises.   Past Medical History:  Diagnosis Date   Abdominal pain 12/2009   hospitalized   Allergic rhinitis    Anxiety    Bladder problem    Chronic constipation    Compression fx, lumbar spine (HCC)    DDD (degenerative disc disease), lumbar    Diverticulosis of colon    Dog bite(E906.0) 08/30/2011   GERD (gastroesophageal reflux disease)    Hepatic steatosis    pt denies   Hyperlipidemia    Hyperplastic colon polyp    IBS (irritable bowel syndrome)    constipation predominant   Internal hemorrhoids    Longstanding persistent atrial fibrillation (HCC)    Mitral regurgitation    Osteoporosis    dexa 2011 -2.7 hip nl spine   PMR (polymyalgia rheumatica) (HCC) 08/30/2011   under rheum care  on low dose pred 5 mg    Renal disorder    Syncope    Tricuspid regurgitation    Past Surgical History:  Procedure Laterality Date   ABDOMINAL HYSTERECTOMY     with bladder tact and rectocele repair   BLADDER SURGERY     Streched   CATARACT EXTRACTION  2010   both  COLONOSCOPY  2016 was most recent   CYSTO WITH HYDRODISTENSION N/A 10/05/2019   Procedure: CYSTOSCOPY/HYDRODISTENSION;  Surgeon: Alfredo Martinez, MD;  Location: [facility];  Service: Urology;  Laterality: N/A;   TONSILLECTOMY AND ADENOIDECTOMY      Allergies  Allergen Reactions   Alendronate Sodium     upset stomach   Penicillins     Has patient had a PCN reaction causing immediate rash, facial/tongue/throat swelling, SOB or lightheadedness with hypotension: Yes Has patient had a PCN reaction causing severe rash involving mucus membranes or skin necrosis: No Has patient  had a PCN reaction that required hospitalization: No Has patient had a PCN reaction occurring within the last 10 years: No If all of the above answers are "NO", then may proceed with Cephalosporin use.    Ciprofloxacin     Had headaches; swelling of feet.    Amoxicillin Rash    Has patient had a PCN reaction causing immediate rash, facial/tongue/throat swelling, SOB or lightheadedness with hypotension: yes Has patient had a PCN reaction causing severe rash involving mucus membranes or skin necrosis: no Has patient had a PCN reaction that required hospitalization: no Has patient had PCN reaction within the last 10 years: no  If all of the above answers are "NO", then may proceed with Cephalosporin use.  REACTION: unspecified   Metronidazole Other (See Comments)    Pounding headaches patient says was bad.  With cipro for diverticulitis rx. Other reaction(s): Other (See Comments) Pounding headaches patient says was bad.  With cipro for diverticulitis rx.    Allergies as of 02/24/2023       Reactions   Alendronate Sodium    upset stomach   Penicillins    Has patient had a PCN reaction causing immediate rash, facial/tongue/throat swelling, SOB or lightheadedness with hypotension: Yes Has patient had a PCN reaction causing severe rash involving mucus membranes or skin necrosis: No Has patient had a PCN reaction that required hospitalization: No Has patient had a PCN reaction occurring within the last 10 years: No If all of the above answers are "NO", then may proceed with Cephalosporin use.   Ciprofloxacin    Had headaches; swelling of feet.    Amoxicillin Rash   Has patient had a PCN reaction causing immediate rash, facial/tongue/throat swelling, SOB or lightheadedness with hypotension: yes Has patient had a PCN reaction causing severe rash involving mucus membranes or skin necrosis: no Has patient had a PCN reaction that required hospitalization: no Has patient had PCN reaction  within the last 10 years: no  If all of the above answers are "NO", then may proceed with Cephalosporin use. REACTION: unspecified   Metronidazole Other (See Comments)   Pounding headaches patient says was bad.  With cipro for diverticulitis rx. Other reaction(s): Other (See Comments) Pounding headaches patient says was bad.  With cipro for diverticulitis rx.        Medication List        Accurate as of February 24, 2023  1:45 PM. If you have any questions, ask your nurse or doctor.          acetaminophen 500 MG tablet Commonly known as: TYLENOL Take 500 mg by mouth at bedtime.   Align 4 MG Caps Take 1 capsule by mouth as needed.   benzonatate 200 MG capsule Commonly known as: TESSALON Take 1 capsule (200 mg total) by mouth 2 (two) times daily as needed for cough.   calcium carbonate 1250 (500 Ca) MG chewable tablet Commonly  known as: OS-CAL Chew 1 tablet by mouth daily.   diltiazem 120 MG 24 hr capsule Commonly known as: CARDIZEM CD TAKE 1 CAPSULE BY MOUTH EVERY DAY   Eliquis 5 MG Tabs tablet Generic drug: apixaban TAKE 1 TABLET BY MOUTH TWICE A DAY   escitalopram 10 MG tablet Commonly known as: LEXAPRO Take 1 tablet (10 mg total) by mouth at bedtime.   fluticasone 50 MCG/ACT nasal spray Commonly known as: FLONASE INHALE 2 SPRAYS IN EACH NOSTRIL DAILY X 1 WEEK, THEN 1-2 SPRAYS DAILY. USE LOWEST POSSIBLE DOSE AFTER 1 WEEK   guaiFENesin 600 MG 12 hr tablet Commonly known as: MUCINEX Take 600 mg by mouth daily as needed.   loratadine 10 MG tablet Commonly known as: CLARITIN TAKE 1 TABLET BY MOUTH EVERY DAY   multivitamin tablet Take 1 tablet by mouth daily.   neomycin-polymyxin-hydrocortisone 3.5-10000-1 OTIC suspension Commonly known as: CORTISPORIN Place 4 drops into the right ear 3 (three) times daily.   polyethylene glycol powder 17 GM/SCOOP powder Commonly known as: GLYCOLAX/MIRALAX Take 17 g by mouth daily. Hold for loose stool   Premarin  vaginal cream Generic drug: conjugated estrogens INSERT 1 GRAM VAGINALLY THREE TIMES A WEEKLY, THEN ONCE WEEKLY   rosuvastatin 5 MG tablet Commonly known as: Crestor Take 1 tablet (5 mg total) by mouth daily.   vitamin C 100 MG tablet Take 500 mg by mouth daily.   Xiidra 5 % Soln Generic drug: Lifitegrast Apply 1 drop to eye 2 (two) times daily.        Review of Systems  Constitutional:  Negative for appetite change, chills, fatigue, fever and unexpected weight change.  HENT:  Negative for congestion, dental problem, ear discharge, ear pain, facial swelling, hearing loss, nosebleeds, postnasal drip, rhinorrhea, sinus pressure, sinus pain, sneezing, sore throat, tinnitus and trouble swallowing.   Eyes:  Negative for pain, discharge, redness, itching and visual disturbance.  Respiratory:  Negative for cough, chest tightness, shortness of breath and wheezing.   Cardiovascular:  Negative for chest pain, palpitations and leg swelling.  Gastrointestinal:  Negative for abdominal distention, abdominal pain, blood in stool, constipation, diarrhea, nausea and vomiting.  Endocrine: Negative for cold intolerance, heat intolerance, polydipsia, polyphagia and polyuria.  Genitourinary:  Negative for difficulty urinating, dysuria, flank pain, frequency and urgency.  Musculoskeletal:  Positive for arthralgias. Negative for back pain, gait problem, joint swelling, myalgias, neck pain and neck stiffness.       Right hand still slight sore from recent fall episode   Skin:  Negative for color change, pallor, rash and wound.  Neurological:  Negative for dizziness, syncope, speech difficulty, weakness, light-headedness, numbness and headaches.  Hematological:  Does not bruise/bleed easily.  Psychiatric/Behavioral:  Negative for agitation, behavioral problems, confusion, hallucinations, self-injury, sleep disturbance and suicidal ideas. The patient is not nervous/anxious.     Immunization History   Administered Date(s) Administered   Fluad Quad(high Dose 65+) 01/10/2020, 12/10/2021   Fluad Trivalent(High Dose 65+) 01/06/2023   Influenza Split 01/25/2011, 01/23/2012   Influenza Whole 12/31/2007   Influenza, High Dose Seasonal PF 01/19/2013, 01/10/2014, 01/17/2015, 01/18/2016, 01/02/2017, 01/05/2018, 01/05/2019, 01/05/2019, 02/07/2021   Influenza-Unspecified 01/02/2017   Moderna Sars-Covid-2 Vaccination 02/15/2022   PFIZER(Purple Top)SARS-COV-2 Vaccination 05/07/2019, 06/02/2019, 01/31/2020, 08/02/2020   Pfizer Covid-19 Vaccine Bivalent Booster 63yrs & up 02/19/2021   Pfizer(Comirnaty)Fall Seasonal Vaccine 12 years and older 02/07/2023   Pneumococcal Conjugate-13 02/23/2013   Pneumococcal Polysaccharide-23 04/01/2006, 04/01/2018   Td 04/01/2005   Td (Adult) 11/22/2016   Tdap  08/30/2011, 08/03/2021   Zoster Recombinant(Shingrix) 12/04/2016, 07/27/2017   Zoster, Live 04/02/2007   Pertinent  Health Maintenance Due  Topic Date Due   MAMMOGRAM  09/09/2022   INFLUENZA VACCINE  Completed   DEXA SCAN  Completed      11/22/2022    1:06 PM 11/27/2022   10:51 AM 12/06/2022    2:52 PM 01/06/2023   12:54 PM 02/24/2023    1:14 PM  Fall Risk  Falls in the past year? 0 0 0 0 1  Was there an injury with Fall? 0    0  Fall Risk Category Calculator 0    1  Patient at Risk for Falls Due to No Fall Risks    History of fall(s)  Fall risk Follow up Falls evaluation completed    Falls evaluation completed   Functional Status Survey:    Vitals:   02/24/23 1309  BP: 118/70  Pulse: 79  Resp: 20  Temp: (!) 97.1 F (36.2 C)  SpO2: 99%  Weight: 160 lb 3.2 oz (72.7 kg)  Height: 5\' 2"  (1.575 m)   Body mass index is 29.3 kg/m. Physical Exam Vitals reviewed.  Constitutional:      General: She is not in acute distress.    Appearance: Normal appearance. She is normal weight. She is not ill-appearing or diaphoretic.  HENT:     Head: Normocephalic.     Right Ear: Tympanic membrane, ear canal  and external ear normal. There is no impacted cerumen.     Left Ear: Tympanic membrane, ear canal and external ear normal. There is no impacted cerumen.     Nose: Nose normal. No congestion or rhinorrhea.     Mouth/Throat:     Mouth: Mucous membranes are moist.     Pharynx: Oropharynx is clear. No oropharyngeal exudate or posterior oropharyngeal erythema.  Eyes:     General: No scleral icterus.       Right eye: No discharge.        Left eye: No discharge.     Extraocular Movements: Extraocular movements intact.     Conjunctiva/sclera: Conjunctivae normal.     Pupils: Pupils are equal, round, and reactive to light.  Neck:     Vascular: No carotid bruit.  Cardiovascular:     Rate and Rhythm: Normal rate and regular rhythm.     Pulses: Normal pulses.     Heart sounds: Normal heart sounds. No murmur heard.    No friction rub. No gallop.  Pulmonary:     Effort: Pulmonary effort is normal. No respiratory distress.     Breath sounds: Normal breath sounds. No wheezing, rhonchi or rales.  Chest:     Chest wall: No tenderness.  Abdominal:     General: Bowel sounds are normal. There is no distension.     Palpations: Abdomen is soft. There is no mass.     Tenderness: There is no abdominal tenderness. There is no right CVA tenderness, left CVA tenderness, guarding or rebound.  Musculoskeletal:        General: No swelling or tenderness. Normal range of motion.     Cervical back: Normal range of motion. No rigidity or tenderness.     Right lower leg: No edema.     Left lower leg: No edema.     Comments: Arthritis changes on fingers  Right dorsal hand slight tenderness to touch.bruising resolved.    Lymphadenopathy:     Cervical: No cervical adenopathy.  Skin:    General:  Skin is warm and dry.     Coloration: Skin is not pale.     Findings: No bruising, erythema, lesion or rash.  Neurological:     Mental Status: She is alert and oriented to person, place, and time.     Cranial Nerves: No  cranial nerve deficit.     Sensory: No sensory deficit.     Motor: No weakness.     Coordination: Coordination normal.     Gait: Gait abnormal.  Psychiatric:        Mood and Affect: Mood normal.        Speech: Speech normal.        Behavior: Behavior normal.        Thought Content: Thought content normal.        Judgment: Judgment normal.     Labs reviewed: Recent Labs    08/12/22 1050 12/01/22 0953 02/12/23 0932  NA 139 132* 141  K 4.7 3.9 4.9  CL 102 99 106  CO2 26 25 28   GLUCOSE 92 88 86  BUN 10 9 11   CREATININE 0.83 0.69 0.92  CALCIUM 9.7 9.1 9.1   Recent Labs    08/12/22 1050 12/01/22 0953 02/12/23 0932  AST 20 22 18   ALT 13 19 14   ALKPHOS  --  81  --   BILITOT 0.8 1.0 0.7  PROT 6.8 7.7 6.6  ALBUMIN  --  4.1  --    Recent Labs    08/12/22 1050 12/01/22 0953 02/12/23 0932  WBC 5.1 7.9 6.6  NEUTROABS 2,927 5.4 4,046  HGB 12.9 13.2 12.3  HCT 38.3 39.5 38.4  MCV 94.3 94.0 98.0  PLT 233 227 233   Lab Results  Component Value Date   TSH 4.11 02/12/2023   No results found for: "HGBA1C" Lab Results  Component Value Date   CHOL 149 02/12/2023   HDL 55 02/12/2023   LDLCALC 73 02/12/2023   LDLDIRECT 100.0 04/07/2019   TRIG 119 02/12/2023   CHOLHDL 2.7 02/12/2023    Significant Diagnostic Results in last 30 days:  DG Hand 2 View Right  Result Date: 02/12/2023 CLINICAL DATA:  Initial evaluation for acute trauma, fall. EXAM: RIGHT HAND - 2 VIEW COMPARISON:  None Available. FINDINGS: Bones are diffusely osteopenic, somewhat limiting assessment for fine osseous detail. No acute fracture or dislocation. Scattered osteoarthritic changes noted about the hand, most notably at the first D IP articulation. No discrete osseous lesions. No visible soft tissue injury. IMPRESSION: No acute osseous abnormality within the right hand. Electronically Signed   By: Rise Mu M.D.   On: 02/12/2023 20:52   CT Head Wo Contrast  Result Date:  02/12/2023 CLINICAL DATA:  Initial evaluation for acute head trauma, minor. EXAM: CT HEAD WITHOUT CONTRAST TECHNIQUE: Contiguous axial images were obtained from the base of the skull through the vertex without intravenous contrast. RADIATION DOSE REDUCTION: This exam was performed according to the departmental dose-optimization program which includes automated exposure control, adjustment of the mA and/or kV according to patient size and/or use of iterative reconstruction technique. COMPARISON:  Prior study from 08/03/2021. FINDINGS: Brain: Generalized age-related cerebral atrophy with moderate chronic microvascular ischemic disease. No acute intracranial hemorrhage. No acute large vessel territory infarct. No mass lesion or midline shift. Diffuse ventricular prominence most like related global parenchymal volume loss without hydrocephalus. No extra-axial fluid collection. Vascular: No abnormal hyperdense vessel. Calcified atherosclerosis present at the skull base. Skull: Scalp soft tissues demonstrate no acute finding. Calvarium intact. Sinuses/Orbits:  Globes and orbital soft tissues within normal limits. Ocular senescent calcifications noted. Paranasal sinuses are clear. No mastoid effusion. Other: None. IMPRESSION: 1. No acute intracranial abnormality. 2. Age-related cerebral atrophy with moderate chronic microvascular ischemic disease. Electronically Signed   By: Rise Mu M.D.   On: 02/12/2023 20:50    Assessment/Plan 1. Hyperlipidemia LDL goal <100 -LDL at goal -Dietary modification and exercise advised - Continue on rosuvastatin no side effects reported - Lipid panel; Future  2. AF (paroxysmal atrial fibrillation) (HCC) Heart rate control -Continue on diltiazem and Eliquis - TSH; Future - COMPLETE METABOLIC PANEL WITH GFR; Future - CBC with Differential/Platelet; Future  3. Generalized anxiety disorder Stable -Continue on escitalopram - TSH; Future  4. Primary osteoarthritis  involving multiple joints Continue on over-the-counter analgesic  5. Right hand pain Status post fall on right hand soreness solved improved but still slightly tender.  X-ray was negative for fracture in the ED -Continue on over-the-counter Tylenol as needed for pain -Notify provider if symptoms worsen or not resolved  6. Stage 3a chronic kidney disease (HCC) Creatinine has improved -Encourage hydration - Will continue to avoid Nephrotoxins and dose all other medication for renal clearance   Family/ staff Communication: Reviewed plan of care with patient and daughter verbalized understanding  Labs/tests ordered:  - TSH; Future - COMPLETE METABOLIC PANEL WITH GFR; Future - CBC with Differential/Platelet; Future - Lipid panel; Future  Next Appointment : Return in about 6 months (around 08/24/2023) for medical mangement of chronic issues., Fasting labs in 6 months prior to visit.   Caesar Bookman, NP

## 2023-03-17 ENCOUNTER — Other Ambulatory Visit: Payer: Self-pay | Admitting: Cardiovascular Disease

## 2023-03-17 DIAGNOSIS — I480 Paroxysmal atrial fibrillation: Secondary | ICD-10-CM

## 2023-03-17 NOTE — Telephone Encounter (Signed)
Prescription refill request for Eliquis received. Indication: Afib  Last office visit:10/16/22 ([facility])  Scr: 0.92 (11/124)  Age: 89 Weight: 72.7kg  Appropriate dose. Refill sent.

## 2023-03-22 ENCOUNTER — Other Ambulatory Visit: Payer: Self-pay | Admitting: Family

## 2023-03-22 DIAGNOSIS — J019 Acute sinusitis, unspecified: Secondary | ICD-10-CM

## 2023-04-04 DIAGNOSIS — N3020 Other chronic cystitis without hematuria: Secondary | ICD-10-CM | POA: Diagnosis not present

## 2023-04-21 DIAGNOSIS — I739 Peripheral vascular disease, unspecified: Secondary | ICD-10-CM | POA: Diagnosis not present

## 2023-05-02 ENCOUNTER — Encounter: Payer: Self-pay | Admitting: Family

## 2023-05-02 ENCOUNTER — Ambulatory Visit (INDEPENDENT_AMBULATORY_CARE_PROVIDER_SITE_OTHER): Payer: Medicare Other | Admitting: Family

## 2023-05-02 VITALS — BP 124/76 | HR 71 | Temp 97.8°F | Resp 17 | Ht 62.0 in | Wt 161.8 lb

## 2023-05-02 DIAGNOSIS — J0100 Acute maxillary sinusitis, unspecified: Secondary | ICD-10-CM

## 2023-05-02 DIAGNOSIS — R6889 Other general symptoms and signs: Secondary | ICD-10-CM

## 2023-05-02 LAB — POCT RAPID STREP A (OFFICE): Rapid Strep A Screen: NEGATIVE

## 2023-05-02 LAB — POCT INFLUENZA A/B
Influenza A, POC: NEGATIVE
Influenza B, POC: NEGATIVE

## 2023-05-02 LAB — POC COVID19 BINAXNOW: SARS Coronavirus 2 Ag: NEGATIVE

## 2023-05-02 MED ORDER — AZITHROMYCIN 250 MG PO TABS: ORAL_TABLET | ORAL | 0 refills | Status: AC

## 2023-05-02 NOTE — Progress Notes (Unsigned)
Provider: Richarda Blade FNP-C  Taffany Heiser, Donalee Citrin, NP  Patient Care Team: Tiwanna Tuch, Donalee Citrin, NP as PCP - General (Family Medicine) Kathleene Hazel, MD as PCP - Cardiology (Cardiology) Alfredo Martinez, MD (Urology) Alanson Puls, MD (Dermatology) Maris Berger, MD (Ophthalmology) Lunette Stands, MD (Orthopedic Surgery) Dareen Piano  (Rheumatology) Donzetta Starch, MD as Consulting Physician (Dermatology) Iva Boop, MD as Consulting Physician (Gastroenterology) Drema Halon, MD (Inactive) as Consulting Physician (Otolaryngology)  Extended Emergency Contact Information Primary Emergency Contact: Apple,Karen Address: [address]          [place] [address] [place] Home Phone: [phone] Mobile Phone: [phone] Relation: Daughter  Code Status:  DNR Goals of care: Advanced Directive information    05/02/2023    2:31 PM  Advanced Directives  Does Patient Have a Medical Advance Directive? Yes  Type of Estate agent of [place];Living will  Does patient want to make changes to medical advance directive? No - Guardian declined  Copy of Healthcare Power of Attorney in Chart? Yes - validated most recent copy scanned in chart (See row information)     Chief Complaint  Patient presents with   Acute Visit    Sinus problems and pt c/o ears hurting.    HPI:  Pt is a 89 y.o. female seen today for an acute visit for evaluation of    Past Medical History:  Diagnosis Date   Abdominal pain 12/2009   hospitalized   Allergic rhinitis    Anxiety    Bladder problem    Chronic constipation    Compression fx, lumbar spine (HCC)    DDD (degenerative disc disease), lumbar    Diverticulosis of colon    Dog bite(E906.0) 08/30/2011   GERD (gastroesophageal reflux disease)    Hepatic steatosis    pt denies   Hyperlipidemia    Hyperplastic colon polyp    IBS (irritable bowel syndrome)    constipation predominant    Internal hemorrhoids    Longstanding persistent atrial fibrillation (HCC)    Mitral regurgitation    Osteoporosis    dexa 2011 -2.7 hip nl spine   PMR (polymyalgia rheumatica) (HCC) 08/30/2011   under rheum care  on low dose pred 5 mg    Renal disorder    Syncope    Tricuspid regurgitation    Past Surgical History:  Procedure Laterality Date   ABDOMINAL HYSTERECTOMY     with bladder tact and rectocele repair   BLADDER SURGERY     Streched   CATARACT EXTRACTION  2010   both   COLONOSCOPY  2016 was most recent   CYSTO WITH HYDRODISTENSION N/A 10/05/2019   Procedure: CYSTOSCOPY/HYDRODISTENSION;  Surgeon: Alfredo Martinez, MD;  Location: [facility];  Service: Urology;  Laterality: N/A;   TONSILLECTOMY AND ADENOIDECTOMY      Allergies  Allergen Reactions   Alendronate Sodium     upset stomach   Penicillins     Has patient had a PCN reaction causing immediate rash, facial/tongue/throat swelling, SOB or lightheadedness with hypotension: Yes Has patient had a PCN reaction causing severe rash involving mucus membranes or skin necrosis: No Has patient had a PCN reaction that required hospitalization: No Has patient had a PCN reaction occurring within the last 10 years: No If all of the above answers are "NO", then may proceed with Cephalosporin use.    Ciprofloxacin     Had headaches; swelling of feet.    Amoxicillin Rash    Has patient had  a PCN reaction causing immediate rash, facial/tongue/throat swelling, SOB or lightheadedness with hypotension: yes Has patient had a PCN reaction causing severe rash involving mucus membranes or skin necrosis: no Has patient had a PCN reaction that required hospitalization: no Has patient had PCN reaction within the last 10 years: no  If all of the above answers are "NO", then may proceed with Cephalosporin use.  REACTION: unspecified   Metronidazole Other (See Comments)    Pounding headaches patient says was bad.  With cipro for diverticulitis  rx. Other reaction(s): Other (See Comments) Pounding headaches patient says was bad.  With cipro for diverticulitis rx.    Outpatient Encounter Medications as of 05/02/2023  Medication Sig   acetaminophen (TYLENOL) 500 MG tablet Take 500 mg by mouth at bedtime.   Ascorbic Acid (VITAMIN C) 100 MG tablet Take 500 mg by mouth daily.   calcium carbonate (OS-CAL) 1250 (500 Ca) MG chewable tablet Chew 1 tablet by mouth daily.    diltiazem (CARDIZEM CD) 120 MG 24 hr capsule TAKE 1 CAPSULE BY MOUTH EVERY DAY   ELIQUIS 5 MG TABS tablet TAKE 1 TABLET BY MOUTH TWICE A DAY   escitalopram (LEXAPRO) 10 MG tablet Take 1 tablet (10 mg total) by mouth at bedtime.   fluticasone (FLONASE) 50 MCG/ACT nasal spray INHALE 2 SPRAYS IN EACH NOSTRIL DAILY X 1 WEEK, THEN 1-2 SPRAYS DAILY. USE LOWEST POSSIBLE DOSE AFTER 1 WEEK   guaiFENesin (MUCINEX) 600 MG 12 hr tablet Take 600 mg by mouth daily as needed.   loratadine (CLARITIN) 10 MG tablet TAKE 1 TABLET BY MOUTH EVERY DAY   Multiple Vitamin (MULTIVITAMIN) tablet Take 1 tablet by mouth daily.   PREMARIN vaginal cream INSERT 1 GRAM VAGINALLY THREE TIMES A WEEKLY, THEN ONCE WEEKLY   Probiotic Product (ALIGN) 4 MG CAPS Take 1 capsule by mouth as needed.   rosuvastatin (CRESTOR) 5 MG tablet Take 1 tablet (5 mg total) by mouth daily.   XIIDRA 5 % SOLN Apply 1 drop to eye 2 (two) times daily.   polyethylene glycol powder (GLYCOLAX/MIRALAX) 17 GM/SCOOP powder Take 17 g by mouth daily. Hold for loose stool (Patient not taking: Reported on 05/02/2023)   No facility-administered encounter medications on file as of 05/02/2023.    Review of Systems  Immunization History  Administered Date(s) Administered   Fluad Quad(high Dose 65+) 01/10/2020, 12/10/2021   Fluad Trivalent(High Dose 65+) 01/06/2023   Influenza Split 01/25/2011, 01/23/2012   Influenza Whole 12/31/2007   Influenza, High Dose Seasonal PF 01/19/2013, 01/10/2014, 01/17/2015, 01/18/2016, 01/02/2017, 01/05/2018,  01/05/2019, 01/05/2019, 02/07/2021   Influenza-Unspecified 01/02/2017   Moderna Sars-Covid-2 Vaccination 02/15/2022   PFIZER(Purple Top)SARS-COV-2 Vaccination 05/07/2019, 06/02/2019, 01/31/2020, 08/02/2020   Pfizer Covid-19 Vaccine Bivalent Booster 80yrs & up 02/19/2021   Pfizer(Comirnaty)Fall Seasonal Vaccine 12 years and older 02/07/2023   Pneumococcal Conjugate-13 02/23/2013   Pneumococcal Polysaccharide-23 04/01/2006, 04/01/2018   Td 04/01/2005   Td (Adult) 11/22/2016   Tdap 08/30/2011, 08/03/2021   Zoster Recombinant(Shingrix) 12/04/2016, 07/27/2017   Zoster, Live 04/02/2007   Pertinent  Health Maintenance Due  Topic Date Due   MAMMOGRAM  09/09/2022   INFLUENZA VACCINE  Completed   DEXA SCAN  Completed      11/27/2022   10:51 AM 12/06/2022    2:52 PM 01/06/2023   12:54 PM 02/24/2023    1:14 PM 05/02/2023    2:30 PM  Fall Risk  Falls in the past year? 0 0 0 1 0  Was there an injury with Fall?  0 0  Fall Risk Category Calculator    1 0  Patient at Risk for Falls Due to    History of fall(s)   Fall risk Follow up    Falls evaluation completed    Functional Status Survey:    Vitals:   05/02/23 1436  BP: 124/76  Pulse: 71  Resp: 17  Temp: 97.8 F (36.6 C)  SpO2: 99%  Weight: 161 lb 12.8 oz (73.4 kg)  Height: 5\' 2"  (1.575 m)   Body mass index is 29.59 kg/m. Physical Exam  Labs reviewed: Recent Labs    08/12/22 1050 12/01/22 0953 02/12/23 0932  NA 139 132* 141  K 4.7 3.9 4.9  CL 102 99 106  CO2 26 25 28   GLUCOSE 92 88 86  BUN 10 9 11   CREATININE 0.83 0.69 0.92  CALCIUM 9.7 9.1 9.1   Recent Labs    08/12/22 1050 12/01/22 0953 02/12/23 0932  AST 20 22 18   ALT 13 19 14   ALKPHOS  --  81  --   BILITOT 0.8 1.0 0.7  PROT 6.8 7.7 6.6  ALBUMIN  --  4.1  --    Recent Labs    08/12/22 1050 12/01/22 0953 02/12/23 0932  WBC 5.1 7.9 6.6  NEUTROABS 2,927 5.4 4,046  HGB 12.9 13.2 12.3  HCT 38.3 39.5 38.4  MCV 94.3 94.0 98.0  PLT 233 227 233   Lab  Results  Component Value Date   TSH 4.11 02/12/2023   No results found for: "HGBA1C" Lab Results  Component Value Date   CHOL 149 02/12/2023   HDL 55 02/12/2023   LDLCALC 73 02/12/2023   LDLDIRECT 100.0 04/07/2019   TRIG 119 02/12/2023   CHOLHDL 2.7 02/12/2023    Significant Diagnostic Results in last 30 days:  No results found.  Assessment/Plan There are no diagnoses linked to this encounter.   Family/ staff Communication: Reviewed plan of care with patient  Labs/tests ordered: None   Next Appointment:   Caesar Bookman, NP

## 2023-05-07 ENCOUNTER — Other Ambulatory Visit: Payer: Self-pay | Admitting: Family

## 2023-05-12 DIAGNOSIS — Z85828 Personal history of other malignant neoplasm of skin: Secondary | ICD-10-CM | POA: Diagnosis not present

## 2023-05-12 DIAGNOSIS — D0339 Melanoma in situ of other parts of face: Secondary | ICD-10-CM | POA: Diagnosis not present

## 2023-05-12 DIAGNOSIS — L905 Scar conditions and fibrosis of skin: Secondary | ICD-10-CM | POA: Diagnosis not present

## 2023-05-22 ENCOUNTER — Other Ambulatory Visit: Payer: Self-pay | Admitting: Family

## 2023-05-22 DIAGNOSIS — F411 Generalized anxiety disorder: Secondary | ICD-10-CM

## 2023-07-07 DIAGNOSIS — L988 Other specified disorders of the skin and subcutaneous tissue: Secondary | ICD-10-CM | POA: Diagnosis not present

## 2023-07-07 DIAGNOSIS — D0339 Melanoma in situ of other parts of face: Secondary | ICD-10-CM | POA: Diagnosis not present

## 2023-07-08 DIAGNOSIS — D0339 Melanoma in situ of other parts of face: Secondary | ICD-10-CM | POA: Diagnosis not present

## 2023-07-08 DIAGNOSIS — L988 Other specified disorders of the skin and subcutaneous tissue: Secondary | ICD-10-CM | POA: Diagnosis not present

## 2023-08-04 DIAGNOSIS — I739 Peripheral vascular disease, unspecified: Secondary | ICD-10-CM | POA: Diagnosis not present

## 2023-08-04 DIAGNOSIS — B351 Tinea unguium: Secondary | ICD-10-CM | POA: Diagnosis not present

## 2023-08-12 ENCOUNTER — Other Ambulatory Visit: Payer: Self-pay | Admitting: Family

## 2023-08-12 DIAGNOSIS — R0982 Postnasal drip: Secondary | ICD-10-CM

## 2023-08-22 ENCOUNTER — Other Ambulatory Visit (INDEPENDENT_AMBULATORY_CARE_PROVIDER_SITE_OTHER): Admitting: Family

## 2023-08-22 ENCOUNTER — Other Ambulatory Visit: Payer: Medicare Other

## 2023-08-22 DIAGNOSIS — I480 Paroxysmal atrial fibrillation: Secondary | ICD-10-CM | POA: Diagnosis not present

## 2023-08-22 DIAGNOSIS — E785 Hyperlipidemia, unspecified: Secondary | ICD-10-CM

## 2023-08-22 DIAGNOSIS — R350 Frequency of micturition: Secondary | ICD-10-CM

## 2023-08-22 DIAGNOSIS — R739 Hyperglycemia, unspecified: Secondary | ICD-10-CM | POA: Diagnosis not present

## 2023-08-22 DIAGNOSIS — F411 Generalized anxiety disorder: Secondary | ICD-10-CM

## 2023-08-22 LAB — POCT URINALYSIS DIPSTICK
Bilirubin, UA: NEGATIVE
Glucose, UA: NEGATIVE
Ketones, UA: NEGATIVE
Leukocytes, UA: NEGATIVE
Nitrite, UA: NEGATIVE
Protein, UA: POSITIVE — AB
Spec Grav, UA: 1.01 (ref 1.010–1.025)
Urobilinogen, UA: 0.2 U/dL
pH, UA: 7.5 (ref 5.0–8.0)

## 2023-08-24 LAB — URINE CULTURE
MICRO NUMBER:: 16496150
SPECIMEN QUALITY:: ADEQUATE

## 2023-08-26 ENCOUNTER — Ambulatory Visit: Payer: Self-pay | Admitting: Family

## 2023-08-26 DIAGNOSIS — N3000 Acute cystitis without hematuria: Secondary | ICD-10-CM

## 2023-08-27 ENCOUNTER — Encounter: Payer: Self-pay | Admitting: Family

## 2023-08-27 ENCOUNTER — Ambulatory Visit (INDEPENDENT_AMBULATORY_CARE_PROVIDER_SITE_OTHER): Payer: Medicare Other | Admitting: Family

## 2023-08-27 VITALS — BP 126/78 | HR 69 | Temp 97.1°F | Ht 62.0 in | Wt 164.0 lb

## 2023-08-27 DIAGNOSIS — Z1231 Encounter for screening mammogram for malignant neoplasm of breast: Secondary | ICD-10-CM | POA: Diagnosis not present

## 2023-08-27 DIAGNOSIS — N3000 Acute cystitis without hematuria: Secondary | ICD-10-CM

## 2023-08-27 DIAGNOSIS — R7989 Other specified abnormal findings of blood chemistry: Secondary | ICD-10-CM

## 2023-08-27 DIAGNOSIS — N1831 Chronic kidney disease, stage 3a: Secondary | ICD-10-CM | POA: Diagnosis not present

## 2023-08-27 DIAGNOSIS — F411 Generalized anxiety disorder: Secondary | ICD-10-CM

## 2023-08-27 DIAGNOSIS — E785 Hyperlipidemia, unspecified: Secondary | ICD-10-CM | POA: Diagnosis not present

## 2023-08-27 DIAGNOSIS — R7303 Prediabetes: Secondary | ICD-10-CM

## 2023-08-27 DIAGNOSIS — I480 Paroxysmal atrial fibrillation: Secondary | ICD-10-CM

## 2023-08-27 DIAGNOSIS — R200 Anesthesia of skin: Secondary | ICD-10-CM | POA: Diagnosis not present

## 2023-08-27 DIAGNOSIS — R251 Tremor, unspecified: Secondary | ICD-10-CM

## 2023-08-27 LAB — CBC WITH DIFFERENTIAL/PLATELET
Absolute Lymphocytes: 1775 {cells}/uL (ref 850–3900)
Absolute Monocytes: 568 {cells}/uL (ref 200–950)
Basophils Absolute: 87 {cells}/uL (ref 0–200)
Basophils Relative: 1.5 %
Eosinophils Absolute: 220 {cells}/uL (ref 15–500)
Eosinophils Relative: 3.8 %
HCT: 39 % (ref 35.0–45.0)
Hemoglobin: 12.2 g/dL (ref 11.7–15.5)
MCH: 31 pg (ref 27.0–33.0)
MCHC: 31.3 g/dL — ABNORMAL LOW (ref 32.0–36.0)
MCV: 99 fL (ref 80.0–100.0)
MPV: 9.1 fL (ref 7.5–12.5)
Monocytes Relative: 9.8 %
Neutro Abs: 3149 {cells}/uL (ref 1500–7800)
Neutrophils Relative %: 54.3 %
Platelets: 208 10*3/uL (ref 140–400)
RBC: 3.94 10*6/uL (ref 3.80–5.10)
RDW: 13.1 % (ref 11.0–15.0)
Total Lymphocyte: 30.6 %
WBC: 5.8 10*3/uL (ref 3.8–10.8)

## 2023-08-27 LAB — COMPLETE METABOLIC PANEL WITHOUT GFR
AG Ratio: 1.6 (calc) (ref 1.0–2.5)
ALT: 12 U/L (ref 6–29)
AST: 18 U/L (ref 10–35)
Albumin: 4.2 g/dL (ref 3.6–5.1)
Alkaline phosphatase (APISO): 58 U/L (ref 37–153)
BUN: 14 mg/dL (ref 7–25)
CO2: 27 mmol/L (ref 20–32)
Calcium: 9.1 mg/dL (ref 8.6–10.4)
Chloride: 104 mmol/L (ref 98–110)
Creat: 0.87 mg/dL (ref 0.60–0.95)
Globulin: 2.6 g/dL (ref 1.9–3.7)
Glucose, Bld: 108 mg/dL — ABNORMAL HIGH (ref 65–99)
Potassium: 4.4 mmol/L (ref 3.5–5.3)
Sodium: 139 mmol/L (ref 135–146)
Total Bilirubin: 0.8 mg/dL (ref 0.2–1.2)
Total Protein: 6.8 g/dL (ref 6.1–8.1)

## 2023-08-27 LAB — TEST AUTHORIZATION

## 2023-08-27 LAB — LIPID PANEL
Cholesterol: 120 mg/dL (ref <200)
HDL: 59 mg/dL (ref >=50)
LDL Cholesterol (Calc): 41 mg/dL
Non-HDL Cholesterol (Calc): 61 mg/dL (ref <130)
Total CHOL/HDL Ratio: 2 (calc) (ref <5.0)
Triglycerides: 112 mg/dL (ref <150)

## 2023-08-27 LAB — TSH: TSH: 4.93 m[IU]/L — ABNORMAL HIGH (ref 0.40–4.50)

## 2023-08-27 LAB — HEMOGLOBIN A1C
Hgb A1c MFr Bld: 5.9 % — ABNORMAL HIGH (ref <5.7)
Mean Plasma Glucose: 123 mg/dL
eAG (mmol/L): 6.8 mmol/L

## 2023-08-27 MED ORDER — SACCHAROMYCES BOULARDII 250 MG PO CAPS: 250.0000 mg | ORAL_CAPSULE | Freq: Two times a day (BID) | ORAL | 0 refills | Status: AC

## 2023-08-27 MED ORDER — NITROFURANTOIN MONOHYD MACRO 100 MG PO CAPS: 100.0000 mg | ORAL_CAPSULE | Freq: Two times a day (BID) | ORAL | 0 refills | Status: AC

## 2023-08-27 MED ORDER — APIXABAN 5 MG PO TABS
5.0000 mg | ORAL_TABLET | Freq: Two times a day (BID) | ORAL | 1 refills | Status: AC
Start: 2023-08-27 — End: ?

## 2023-08-27 NOTE — Progress Notes (Signed)
 Provider: Christean Courts FNP-C   Tayven Renteria, Elijio Guadeloupe, NP  Patient Care Team: Yesli Vanderhoff, Elijio Guadeloupe, NP as PCP - General (Family Medicine) Odie Benne, MD as PCP - Cardiology (Cardiology) Erman Hayward, MD (Urology) Christoper Crafts, MD (Dermatology) Dema Filler, MD (Ophthalmology) Alix Aquas, MD (Orthopedic Surgery) Alva Jewels  (Rheumatology) Harlen Lick, MD as Consulting Physician (Dermatology) Kenney Peacemaker, MD as Consulting Physician (Gastroenterology) Prescott Brodie, MD (Inactive) as Consulting Physician (Otolaryngology)  Extended Emergency Contact Information Primary Emergency Contact: Apple,Karen Address: [address]          [place] [address] [place] Home Phone: [phone] Mobile Phone: [phone] Relation: Daughter  Code Status:  Full Code  Goals of care: Advanced Directive information    05/02/2023    2:31 PM  Advanced Directives  Does Patient Have a Medical Advance Directive? Yes  Type of Estate agent of [place];Living will  Does patient want to make changes to medical advance directive? No - Guardian declined  Copy of Healthcare Power of Attorney in Chart? Yes - validated most recent copy scanned in chart (See row information)     Chief Complaint  Patient presents with   Medical Management of Chronic Issues    6 month follow up. Discussed need for mammogram. Discuss Jeric Slagel taken over Eliquis  rx.     Discussed the use of AI scribe software for clinical note transcription with the patient, who gave verbal consent to proceed.  History of Present Illness   Ashley Lawson is an 89 year old female who presents for a six-month follow-up visit.  She inquires about where to get a mammogram done. Recent lab work shows total cholesterol at 120 mg/dL, triglycerides at 732 mg/dL, LDL at 41 mg/dL, and HDL at 59 mg/dL. Glucose level is slightly elevated at 108 mg/dL.  She has a history of urinary  tract infections, with a recent urine test showing an infection with Klebsiella bacteria, with 50,000 to 100,000 colonies present. She is allergic to Cipro , Augmentin, and PCN, and nitrofurantoin  twice daily for seven days prescribed. Daughter will pick-up medication from pharmacy.  Her daughter notes a change in her gait, now leaning forward while walking, with weak legs. She has not been exercising regularly despite having a treadmill at home and a sitter to assist her. She occasionally uses a cane for long walks but generally prefers not to use it.  Her appetite is described as inconsistent, with a preference for sweets. She eats only a third of her meals but later consumes sweets like cookies and ice cream. Her weight has increased from 162 to 164 pounds.  She experiences hand tremors, particularly in the right hand, and takes Tylenol  at night for pain relief. She has arthritis in her fingers, contributing to her discomfort. Her daughter notes she often delays taking Tylenol  despite experiencing pain throughout the day.  Her memory has shown some decline, with occasional difficulty recalling the date. She uses a clock that displays the time and date for reminders. She engages in activities like playing [place] and doing puzzles to maintain cognitive function, though she sometimes needs assistance with counting cards.   Past Medical History:  Diagnosis Date   Abdominal pain 12/2009   hospitalized   Allergic rhinitis    Anxiety    Bladder problem    Chronic constipation    Compression fx, lumbar spine (HCC)    DDD (degenerative disc disease), lumbar    Diverticulosis of colon  Dog bite(E906.0) 08/30/2011   GERD (gastroesophageal reflux disease)    Hepatic steatosis    pt denies   Hyperlipidemia    Hyperplastic colon polyp    IBS (irritable bowel syndrome)    constipation predominant   Internal hemorrhoids    Longstanding persistent atrial fibrillation (HCC)    Mitral regurgitation     Osteoporosis    dexa 2011 -2.7 hip nl spine   PMR (polymyalgia rheumatica) (HCC) 08/30/2011   under rheum care  on low dose pred 5 mg    Renal disorder    Syncope    Tricuspid regurgitation    Past Surgical History:  Procedure Laterality Date   ABDOMINAL HYSTERECTOMY     with bladder tact and rectocele repair   BLADDER SURGERY     Streched   CATARACT EXTRACTION  2010   both   COLONOSCOPY  2016 was most recent   CYSTO WITH HYDRODISTENSION N/A 10/05/2019   Procedure: CYSTOSCOPY/HYDRODISTENSION;  Surgeon: Erman Hayward, MD;  Location: [facility];  Service: Urology;  Laterality: N/A;   TONSILLECTOMY AND ADENOIDECTOMY      Allergies  Allergen Reactions   Alendronate Sodium     upset stomach   Penicillins     Has patient had a PCN reaction causing immediate rash, facial/tongue/throat swelling, SOB or lightheadedness with hypotension: Yes Has patient had a PCN reaction causing severe rash involving mucus membranes or skin necrosis: No Has patient had a PCN reaction that required hospitalization: No Has patient had a PCN reaction occurring within the last 10 years: No If all of the above answers are "NO", then may proceed with Cephalosporin use.    Ciprofloxacin      Had headaches; swelling of feet.    Amoxicillin Rash    Has patient had a PCN reaction causing immediate rash, facial/tongue/throat swelling, SOB or lightheadedness with hypotension: yes Has patient had a PCN reaction causing severe rash involving mucus membranes or skin necrosis: no Has patient had a PCN reaction that required hospitalization: no Has patient had PCN reaction within the last 10 years: no  If all of the above answers are "NO", then may proceed with Cephalosporin use.  REACTION: unspecified   Metronidazole  Other (See Comments)    Pounding headaches patient says was bad.  With cipro  for diverticulitis rx. Other reaction(s): Other (See Comments) Pounding headaches patient says was bad.  With cipro  for  diverticulitis rx.    Allergies as of 08/27/2023       Reactions   Alendronate Sodium    upset stomach   Penicillins    Has patient had a PCN reaction causing immediate rash, facial/tongue/throat swelling, SOB or lightheadedness with hypotension: Yes Has patient had a PCN reaction causing severe rash involving mucus membranes or skin necrosis: No Has patient had a PCN reaction that required hospitalization: No Has patient had a PCN reaction occurring within the last 10 years: No If all of the above answers are "NO", then may proceed with Cephalosporin use.   Ciprofloxacin     Had headaches; swelling of feet.    Amoxicillin Rash   Has patient had a PCN reaction causing immediate rash, facial/tongue/throat swelling, SOB or lightheadedness with hypotension: yes Has patient had a PCN reaction causing severe rash involving mucus membranes or skin necrosis: no Has patient had a PCN reaction that required hospitalization: no Has patient had PCN reaction within the last 10 years: no  If all of the above answers are "NO", then may proceed with Cephalosporin use. REACTION:  unspecified   Metronidazole  Other (See Comments)   Pounding headaches patient says was bad.  With cipro  for diverticulitis rx. Other reaction(s): Other (See Comments) Pounding headaches patient says was bad.  With cipro  for diverticulitis rx.        Medication List        Accurate as of Aug 27, 2023  8:18 PM. If you have any questions, ask your nurse or doctor.          acetaminophen  500 MG tablet Commonly known as: TYLENOL  Take 500 mg by mouth at bedtime.   Align 4 MG Caps Take 1 capsule by mouth as needed.   apixaban  5 MG Tabs tablet Commonly known as: Eliquis  Take 1 tablet (5 mg total) by mouth 2 (two) times daily.   calcium  carbonate 1250 (500 Ca) MG chewable tablet Commonly known as: OS-CAL Chew 1 tablet by mouth daily.   diltiazem  120 MG 24 hr capsule Commonly known as: CARDIZEM  CD TAKE 1 CAPSULE  BY MOUTH EVERY DAY   escitalopram  10 MG tablet Commonly known as: LEXAPRO  TAKE 1 TABLET BY MOUTH EVERYDAY AT BEDTIME   fluticasone  50 MCG/ACT nasal spray Commonly known as: FLONASE  INHALE 2 SPRAYS IN EACH NOSTRIL DAILY X 1 WEEK, THEN 1-2 SPRAYS DAILY. USE LOWEST POSSIBLE DOSE AFTER 1 WEEK   guaiFENesin  600 MG 12 hr tablet Commonly known as: MUCINEX  Take 600 mg by mouth daily as needed.   loratadine  10 MG tablet Commonly known as: CLARITIN  TAKE 1 TABLET BY MOUTH EVERY DAY   multivitamin tablet Take 1 tablet by mouth daily.   nitrofurantoin  (macrocrystal-monohydrate) 100 MG capsule Commonly known as: Macrobid  Take 1 capsule (100 mg total) by mouth 2 (two) times daily for 7 days.   polyethylene glycol powder 17 GM/SCOOP powder Commonly known as: GLYCOLAX /MIRALAX  Take 17 g by mouth daily. Hold for loose stool   Premarin vaginal cream Generic drug: conjugated estrogens INSERT 1 GRAM VAGINALLY THREE TIMES A WEEKLY, THEN ONCE WEEKLY   rosuvastatin  5 MG tablet Commonly known as: CRESTOR  TAKE 1 TABLET (5 MG TOTAL) BY MOUTH DAILY.   saccharomyces boulardii 250 MG capsule Commonly known as: Florastor Take 1 capsule (250 mg total) by mouth 2 (two) times daily for 10 days.   vitamin C 100 MG tablet Take 500 mg by mouth daily.   Xiidra 5 % Soln Generic drug: Lifitegrast Apply 1 drop to eye 2 (two) times daily.        Review of Systems  Constitutional:  Negative for appetite change, chills, fatigue, fever and unexpected weight change.  HENT:  Negative for congestion, ear discharge, ear pain, hearing loss, nosebleeds, postnasal drip, rhinorrhea, sinus pressure, sinus pain, sneezing, sore throat, tinnitus and trouble swallowing.   Eyes:  Negative for pain, discharge, redness, itching and visual disturbance.  Respiratory:  Negative for cough, chest tightness, shortness of breath and wheezing.   Cardiovascular:  Negative for chest pain, palpitations and leg swelling.   Gastrointestinal:  Negative for abdominal distention, abdominal pain, blood in stool, constipation, diarrhea, nausea and vomiting.  Endocrine: Negative for cold intolerance, heat intolerance, polydipsia, polyphagia and polyuria.  Genitourinary:  Positive for frequency. Negative for difficulty urinating, dysuria, flank pain and urgency.  Musculoskeletal:  Positive for arthralgias and gait problem. Negative for back pain, joint swelling, myalgias, neck pain and neck stiffness.  Skin:  Negative for color change, pallor, rash and wound.  Neurological:  Positive for tremors. Negative for dizziness, syncope, speech difficulty, weakness, light-headedness, numbness and headaches.  Right hand   Hematological:  Does not bruise/bleed easily.  Psychiatric/Behavioral:  Negative for agitation, behavioral problems, confusion, hallucinations and sleep disturbance. The patient is not nervous/anxious.     Immunization History  Administered Date(s) Administered   Fluad Quad(high Dose 65+) 01/10/2020, 12/10/2021   Fluad Trivalent(High Dose 65+) 01/06/2023   Influenza Split 01/25/2011, 01/23/2012   Influenza Whole 12/31/2007   Influenza, High Dose Seasonal PF 01/19/2013, 01/10/2014, 01/17/2015, 01/18/2016, 01/02/2017, 01/05/2018, 01/05/2019, 01/05/2019, 02/07/2021   Influenza-Unspecified 01/02/2017   Moderna Sars-Covid-2 Vaccination 02/15/2022   PFIZER(Purple Top)SARS-COV-2 Vaccination 05/07/2019, 06/02/2019, 01/31/2020, 08/02/2020   Pfizer Covid-19 Vaccine Bivalent Booster 27yrs & up 02/19/2021   Pfizer(Comirnaty)Fall Seasonal Vaccine 12 years and older 02/07/2023   Pneumococcal Conjugate-13 02/23/2013   Pneumococcal Polysaccharide-23 04/01/2006, 04/01/2018   Td 04/01/2005   Td (Adult) 11/22/2016   Tdap 08/30/2011, 08/03/2021   Zoster Recombinant(Shingrix) 12/04/2016, 07/27/2017   Zoster, Live 04/02/2007   Pertinent  Health Maintenance Due  Topic Date Due   MAMMOGRAM  09/09/2022   INFLUENZA  VACCINE  10/31/2023   DEXA SCAN  Completed      12/06/2022    2:52 PM 01/06/2023   12:54 PM 02/24/2023    1:14 PM 05/02/2023    2:30 PM 08/27/2023    1:02 PM  Fall Risk  Falls in the past year? 0 0 1 0 1  Was there an injury with Fall?   0 0 0  Fall Risk Category Calculator   1 0 2  Patient at Risk for Falls Due to   History of fall(s)  History of fall(s)  Fall risk Follow up   Falls evaluation completed  Falls evaluation completed   Functional Status Survey:    Vitals:   08/27/23 1257  BP: 126/78  Pulse: 69  Temp: (!) 97.1 F (36.2 C)  SpO2: 96%  Weight: 164 lb (74.4 kg)  Height: 5\' 2"  (1.575 m)   Body mass index is 30 kg/m. Physical Exam  MEASUREMENTS: Weight- 164. GENERAL: Alert, cooperative, well developed, no acute distress HEENT: Normocephalic, normal oropharynx, moist mucous membranes, ears and nose normal CHEST: Clear to auscultation bilaterally, no wheezes, rhonchi, or crackles CARDIOVASCULAR: Normal heart rate and rhythm, S1 and S2 normal without murmurs ABDOMEN: Soft, non-tender, non-distended, without organomegaly, normal bowel sounds EXTREMITIES: No cyanosis or edema NEUROLOGICAL: Cranial nerves grossly intact, moves all extremities without gross motor or sensory deficit, tremor in both hands, more pronounced in right hand, patient oriented to person and day, disoriented to year  SKIN: No rash,no lesion or erythema   PSYCHIATRY/BEHAVIORAL: Mood stable    Labs reviewed: Recent Labs    12/01/22 0953 02/12/23 0932 08/22/23 0901  NA 132* 141 139  K 3.9 4.9 4.4  CL 99 106 104  CO2 25 28 27   GLUCOSE 88 86 108*  BUN 9 11 14   CREATININE 0.69 0.92 0.87  CALCIUM  9.1 9.1 9.1   Recent Labs    12/01/22 0953 02/12/23 0932 08/22/23 0901  AST 22 18 18   ALT 19 14 12   ALKPHOS 81  --   --   BILITOT 1.0 0.7 0.8  PROT 7.7 6.6 6.8  ALBUMIN 4.1  --   --    Recent Labs    12/01/22 0953 02/12/23 0932 08/22/23 0901  WBC 7.9 6.6 5.8  NEUTROABS 5.4 4,046  3,149  HGB 13.2 12.3 12.2  HCT 39.5 38.4 39.0  MCV 94.0 98.0 99.0  PLT 227 233 208   Lab Results  Component Value Date  TSH 4.93 (H) 08/22/2023   No results found for: "HGBA1C" Lab Results  Component Value Date   CHOL 120 08/22/2023   HDL 59 08/22/2023   LDLCALC 41 08/22/2023   LDLDIRECT 100.0 04/07/2019   TRIG 112 08/22/2023   CHOLHDL 2.0 08/22/2023    Significant Diagnostic Results in last 30 days:  No results found.  Assessment/Plan  Urinary tract infection due to Klebsiella Urinary tract infection caused by Klebsiella with 50,000 to 100,000 colonies. Allergic to Cipro , Augmentin, and Keflex , limiting antibiotic options. Nitrofurantoin  chosen as the alternative treatment. - Prescribe nitrofurantoin , one tablet in the morning and one in the evening for seven days - Encourage increased water intake - Recommend taking a probiotic to balance gut flora  Tremor Unspecified tremor, more pronounced in the right hand. No associated leg tremors or head shaking. Pain in hands attributed to arthritis, but tremor requires further evaluation. - Refer to neurologist for evaluation of tremor  Memory changes Memory changes with occasional difficulty recognizing familiar people and recalling dates. Able to perform daily activities with some assistance. Discussion of potential medications for memory support, such as Aricept and Namenda, but decision deferred to neurologist. - Refer to neurologist for evaluation of memory changes  Arthritis of the fingers Arthritis in the fingers causing pain, particularly at night. Pain managed with Tylenol , though she delays taking it due to concerns about drowsiness. - Encourage taking Tylenol  in the morning for pain management  Prediabetes Glucose level slightly elevated at 108 mg/dL, indicating prediabetes. Advised to reduce sugary food intake to prevent progression to diabetes. Consideration of A1c test for future evaluations. - Advise reduction  of sugary foods and drinks - Consider adding A1c test in future evaluations  Thyroid  function abnormality Thyroid  function slightly elevated at 4.93, improved from previous levels but not enough to warrant medication adjustment.  General Health Maintenance Discussion on the need for a mammogram and maintaining a healthy lifestyle through diet and exercise. Noted weight increase from 162 to 164 pounds. Encouraged to replace ice cream with fruits and to exercise to strengthen legs and prevent falls. - Order mammogram - Encourage exercise to strengthen legs and prevent falls - Advise replacing ice cream with fruits like apples and blueberries - Encourage use of a cane or walker for stability if needed  Follow-up Follow-up plans discussed for various health concerns and specialist referrals. Concerns about Eliquis  prescription refill due to difficulty contacting cardiologist. - Recheck lab work in six months - Ensure follow-up with cardiologist for Eliquis  prescription - Coordinate neurologist appointment for tremor and memory evaluation - Advise obtaining COVID booster at pharmacy    Family/ staff Communication: Reviewed plan of care with patient verbalized understanding   Labs/tests ordered:  - CBC/diff - CMP  - TSH - Lipid panel  - Mammogram  Next Appointment : Return in about 6 months (around 02/27/2024) for medical mangement of chronic issues., Fasting labs in 6 months prior to visit.   Spent 30 minutes of Face to face and non-face to face with patient  >50% time spent counseling; reviewing medical record; tests; labs; documentation and developing future plan of care.   Estil Heman, NP

## 2023-09-01 ENCOUNTER — Encounter: Payer: Self-pay | Admitting: Neurology

## 2023-09-02 ENCOUNTER — Ambulatory Visit: Payer: Self-pay | Admitting: Family

## 2023-09-10 NOTE — Progress Notes (Signed)
 Assessment/Plan:   1.  Gait instability  - Possible vascular parkinsonism.  No evidence of idiopathic Parkinson's disease today.  Discussed nature and etiology of vascular parkinsonism and how it differs from idiopathic Parkinson's disease.  Given that they feel that it started fairly recently, we will go ahead and do an MRI of the brain to make sure we are not missing anything.  She was dragging the right leg somewhat and I want to make sure we are not missing any new lesions.  They are agreeable to that.  If we do not see anything new (other than small vessel disease), then I discussed with them that physical therapy is really the mainstay of treatment.  They were agreeable to that and I will go ahead and send an order for physical therapy.  2.  Memory change  - Likely dementia of the Alzheimer's type, but vascular dementia cannot be ruled out.  - MRI brain, as above.  - Family is doing a great job with caregiving.  - Given degree of memory loss, we did discuss that patient probably should have 24 hour/day caregiving at this point.  We talked about how to put that together.  Patient does not need skilled nursing.  Was given information to [facility], as well as discussing alternative solutions for caregiving.  Family was appreciative.  - At this point, I am not sure how helpful NMDA receptor antagonists or acetylcholinesterase inhibitors will be, given degree of memory change.  Decided to hold on those for now.  3.  Unless new issues arise, I will be following up with her in about 6 months.  Subjective:   Ashley Lawson was seen today in the movement disorders clinic for neurologic consultation at the request of Ngetich, Dinah C, NP.  The consultation is for the evaluation of tremor, right greater than left hand and memory change.  Medical records made available to me are reviewed.  This patient is accompanied in the office by her son and daughter who supplements the history.    Tremor: patient doesn't notice it much but daughter notes that patient will c/o some tremor in the AM  How long has it been going on? Months per daughter  At rest or with activation?  activation  When is it noted the most?  Eating in the AM - and mostly R handed  Fam hx of tremor?  No.  Located where?  R hand  Affected by caffeine:  doesn't drink much  Affected by alcohol:  doesn't drink much/any  Affected by stress:  I don't notice my hands shaking   Other Specific Symptoms:  Voice: no change Postural symptoms:  Yes.   - over the last few months daughter notices forward flexion.  They bought a lift chair 3 weeks ago to help her get up but she isn't using the lift function  Falls?  No falls in 1 year Bradykinesia symptoms: shuffling gait, slow movements, and difficulty getting out of a chair Loss of smell:  No. Loss of taste:  No. Urinary Incontinence:  No. Difficulty Swallowing:  No. Handwriting, micrographia: yes Trouble with ADL's:  No.  Trouble buttoning clothing: No. Depression:  No. Memory changes:  Yes.   - daughter lives with patient x last 20 years - initially for convenience sake and now for caregiving; pt not driving x 3 years; daughter started doing banking/finances this past year due to issues with banking; monthly finances done by daughter; cooking done by daughter;  daughter started doing pill box this year as it wasn't done right and she went on eliquis  and they wanted to make sure it was done right; daughter makes sure pills are taken right.   Hallucinations:  perhaps - a sitter comes 11am-2pm tues-thurs and sitter notes that pt seems to see someone outside.  Pt denies seeing anyone to me N/V:  No. Lightheaded:  No.  Syncope: No. Diplopia:  No. Dyskinesia:  No.  Last neuroimaging was a CT brain in November, 2024 which demonstrated moderate small vessel disease and atrophy.   ALLERGIES:   Allergies  Allergen Reactions   Alendronate Sodium     upset stomach    Penicillins     Has patient had a PCN reaction causing immediate rash, facial/tongue/throat swelling, SOB or lightheadedness with hypotension: Yes Has patient had a PCN reaction causing severe rash involving mucus membranes or skin necrosis: No Has patient had a PCN reaction that required hospitalization: No Has patient had a PCN reaction occurring within the last 10 years: No If all of the above answers are NO, then may proceed with Cephalosporin use.    Ciprofloxacin      Had headaches; swelling of feet.    Amoxicillin Rash    Has patient had a PCN reaction causing immediate rash, facial/tongue/throat swelling, SOB or lightheadedness with hypotension: yes Has patient had a PCN reaction causing severe rash involving mucus membranes or skin necrosis: no Has patient had a PCN reaction that required hospitalization: no Has patient had PCN reaction within the last 10 years: no  If all of the above answers are NO, then may proceed with Cephalosporin use.  REACTION: unspecified   Metronidazole  Other (See Comments)    Pounding headaches patient says was bad.  With cipro  for diverticulitis rx. Other reaction(s): Other (See Comments) Pounding headaches patient says was bad.  With cipro  for diverticulitis rx.    CURRENT MEDICATIONS:  Current Outpatient Medications  Medication Instructions   acetaminophen  (TYLENOL ) 500 mg, Daily at bedtime   apixaban  (ELIQUIS ) 5 mg, Oral, 2 times daily   calcium  carbonate (OS-CAL) 1250 (500 Ca) MG chewable tablet 1 tablet, Daily   diltiazem  (CARDIZEM  CD) 120 MG 24 hr capsule TAKE 1 CAPSULE BY MOUTH EVERY DAY   escitalopram  (LEXAPRO ) 10 MG tablet TAKE 1 TABLET BY MOUTH EVERYDAY AT BEDTIME   fluticasone  (FLONASE ) 50 MCG/ACT nasal spray INHALE 2 SPRAYS IN EACH NOSTRIL DAILY X 1 WEEK, THEN 1-2 SPRAYS DAILY. USE LOWEST POSSIBLE DOSE AFTER 1 WEEK   guaiFENesin  (MUCINEX ) 600 mg, Daily PRN   loratadine  (CLARITIN ) 10 mg, Oral, Daily   Multiple Vitamin  (MULTIVITAMIN) tablet 1 tablet, Daily   polyethylene glycol powder (GLYCOLAX /MIRALAX ) 17 g, Oral, Daily, Hold for loose stool   PREMARIN vaginal cream INSERT 1 GRAM VAGINALLY THREE TIMES A WEEKLY, THEN ONCE WEEKLY   Probiotic Product (ALIGN) 4 MG CAPS 1 capsule, As needed   rosuvastatin  (CRESTOR ) 5 mg, Oral, Daily   vitamin C 500 mg, Daily   XIIDRA 5 % SOLN 1 drop, 2 times daily    Objective:   PHYSICAL EXAMINATION:    VITALS:   Vitals:   09/15/23 1226  BP: 118/70  Pulse: 71  SpO2: 93%  Weight: 162 lb (73.5 kg)  Height: 5' 1 (1.549 m)    GEN:  The patient appears stated age and is in NAD. HEENT:  Normocephalic, atraumatic.  The mucous membranes are moist. The superficial temporal arteries are without ropiness or tenderness. CV:  RRR Lungs:  CTAB Neck/HEME:  There are no carotid bruits bilaterally.  Neurological examination:  Orientation:     09/15/2023   12:00 PM  Montreal Cognitive Assessment   Visuospatial/ Executive (0/5) 0  Naming (0/3) 1  Attention: Read list of digits (0/2) 2  Attention: Read list of letters (0/1) 1  Attention: Serial 7 subtraction starting at 100 (0/3) 0  Language: Repeat phrase (0/2) 1  Language : Fluency (0/1) 1  Abstraction (0/2) 0  Delayed Recall (0/5) 1  Orientation (0/6) 4  Total 11  Adjusted Score (based on education) 12    Cranial nerves: There is good facial symmetry.  Extraocular muscles are intact. The visual fields are full to confrontational testing. The speech is fluent and clear. Soft palate rises symmetrically and there is no tongue deviation. Hearing is intact to conversational tone. Sensation: Sensation is intact to light touch throughout (facial, trunk, extremities). Vibration is intact at the bilateral ankle but slightly decreased distally. There is no extinction with double simultaneous stimulation.  Motor: Strength is 5/5 in the bilateral upper and lower extremities.   Shoulder shrug is equal and symmetric.  There is no  pronator drift. Deep tendon reflexes: Deep tendon reflexes are 2/4 at the bilateral biceps, triceps, brachioradialis, patella and absent at the bilateral achilles. Plantar responses are downgoing bilaterally.  Movement examination: Tone: There is nl tone in the bilateral upper extremities.  The tone in the lower extremities is nl.  Abnormal movements: no rest tremor.  No postural or intention tremor Coordination:  There is no decremation with RAM's, with any form of RAMS, including alternating supination and pronation of the forearm, hand opening and closing, finger taps, heel taps and toe taps.  Gait and Station: The patient pushes off to arise.  She is forward flexed with decreased arm swing, R>L.  She drags the R leg a bit.  She is unsteady in the turn.  I have reviewed and interpreted the following labs independently   Chemistry      Component Value Date/Time   NA 139 08/22/2023 0901   NA 135 05/28/2017 1042   K 4.4 08/22/2023 0901   CL 104 08/22/2023 0901   CO2 27 08/22/2023 0901   BUN 14 08/22/2023 0901   BUN 8 05/28/2017 1042   CREATININE 0.87 08/22/2023 0901      Component Value Date/Time   CALCIUM  9.1 08/22/2023 0901   CALCIUM  9.1 08/10/2009 2138   ALKPHOS 81 12/01/2022 0953   AST 18 08/22/2023 0901   ALT 12 08/22/2023 0901   BILITOT 0.8 08/22/2023 0901      Lab Results  Component Value Date   TSH 4.93 (H) 08/22/2023   Lab Results  Component Value Date   WBC 5.8 08/22/2023   HGB 12.2 08/22/2023   HCT 39.0 08/22/2023   MCV 99.0 08/22/2023   PLT 208 08/22/2023   No results found for: VITAMINB12    Total time spent on today's visit was 60 minutes, including both face-to-face time and nonface-to-face time.  Time included that spent on review of records (prior notes available to me/labs/imaging if pertinent), discussing treatment and goals, answering patient's questions and coordinating care.  Cc:  Ngetich, Dinah C, NP

## 2023-09-15 ENCOUNTER — Ambulatory Visit: Admitting: Neurology

## 2023-09-15 ENCOUNTER — Ambulatory Visit

## 2023-09-15 ENCOUNTER — Encounter: Payer: Self-pay | Admitting: Neurology

## 2023-09-15 VITALS — BP 118/70 | HR 71 | Ht 61.0 in | Wt 162.0 lb

## 2023-09-15 DIAGNOSIS — G214 Vascular parkinsonism: Secondary | ICD-10-CM | POA: Diagnosis not present

## 2023-09-15 DIAGNOSIS — R413 Other amnesia: Secondary | ICD-10-CM

## 2023-09-15 DIAGNOSIS — R531 Weakness: Secondary | ICD-10-CM

## 2023-09-15 NOTE — Patient Instructions (Signed)
 We discussed the importance of 24 hour per day care.  A referral to [facility] has been placed for your MRI someone will contact you directly to schedule your appt. They are located at [address] [facility]. Please contact them directly by calling [phone] with any questions regarding your referral.  The physicians and staff at [facility] are committed to providing excellent care. You may receive a survey requesting feedback about your experience at our office. We strive to receive very good responses to the survey questions. If you feel that your experience would prevent you from giving the office a very good  response, please contact our office to try to remedy the situation. We may be reached at [phone]. Thank you for taking the time out of your busy day to complete the survey.

## 2023-09-29 ENCOUNTER — Ambulatory Visit

## 2023-09-29 ENCOUNTER — Other Ambulatory Visit

## 2023-10-08 ENCOUNTER — Ambulatory Visit
Admission: RE | Admit: 2023-10-08 | Discharge: 2023-10-08 | Disposition: A | Discharge status: Home / Self Care | Source: Ambulatory Visit | Attending: Neurology | Admitting: Neurology

## 2023-10-08 DIAGNOSIS — G214 Vascular parkinsonism: Secondary | ICD-10-CM

## 2023-10-08 DIAGNOSIS — R29818 Other symptoms and signs involving the nervous system: Secondary | ICD-10-CM | POA: Diagnosis not present

## 2023-10-08 DIAGNOSIS — R531 Weakness: Secondary | ICD-10-CM

## 2023-10-08 DIAGNOSIS — N3020 Other chronic cystitis without hematuria: Secondary | ICD-10-CM | POA: Diagnosis not present

## 2023-10-08 DIAGNOSIS — R350 Frequency of micturition: Secondary | ICD-10-CM | POA: Diagnosis not present

## 2023-10-08 DIAGNOSIS — R109 Unspecified abdominal pain: Secondary | ICD-10-CM | POA: Diagnosis not present

## 2023-10-08 DIAGNOSIS — G319 Degenerative disease of nervous system, unspecified: Secondary | ICD-10-CM | POA: Diagnosis not present

## 2023-10-08 DIAGNOSIS — R413 Other amnesia: Secondary | ICD-10-CM

## 2023-10-08 DIAGNOSIS — I6782 Cerebral ischemia: Secondary | ICD-10-CM | POA: Diagnosis not present

## 2023-10-15 ENCOUNTER — Encounter: Payer: Self-pay | Admitting: Physical Therapy

## 2023-10-15 ENCOUNTER — Ambulatory Visit: Attending: Neurology | Admitting: Physical Therapy

## 2023-10-15 ENCOUNTER — Other Ambulatory Visit: Payer: Self-pay

## 2023-10-15 DIAGNOSIS — M6281 Muscle weakness (generalized): Secondary | ICD-10-CM | POA: Insufficient documentation

## 2023-10-15 DIAGNOSIS — R2681 Unsteadiness on feet: Secondary | ICD-10-CM | POA: Diagnosis not present

## 2023-10-15 DIAGNOSIS — R2689 Other abnormalities of gait and mobility: Secondary | ICD-10-CM | POA: Diagnosis not present

## 2023-10-15 DIAGNOSIS — M5459 Other low back pain: Secondary | ICD-10-CM | POA: Diagnosis not present

## 2023-10-15 DIAGNOSIS — R262 Difficulty in walking, not elsewhere classified: Secondary | ICD-10-CM | POA: Diagnosis not present

## 2023-10-15 DIAGNOSIS — M25551 Pain in right hip: Secondary | ICD-10-CM | POA: Diagnosis not present

## 2023-10-15 DIAGNOSIS — G214 Vascular parkinsonism: Secondary | ICD-10-CM | POA: Insufficient documentation

## 2023-10-15 NOTE — Therapy (Signed)
 OUTPATIENT PHYSICAL THERAPY NEURO EVALUATION   Patient Name: Ashley Lawson MRN: [id number] DOB:1932-05-12, 89 y.o., female Today's Date: 10/15/2023   PCP: Leonarda Roxan BROCKS, NP REFERRING PROVIDER: Evonnie Asberry RAMAN, DO  END OF SESSION:  PT End of Session - 10/15/23 1255     Visit Number 1    Number of Visits 16    Date for PT Re-Evaluation 12/10/23    Authorization Type [employer] Medicare    Authorization Time Period Auth requested 10/15/23    Progress Note Due on Visit 10    PT Start Time 1256    PT Stop Time 1335    PT Time Calculation (min) 39 min    Activity Tolerance Patient tolerated treatment well          Past Medical History:  Diagnosis Date   Abdominal pain 12/2009   hospitalized   Allergic rhinitis    Anxiety    Bladder problem    Chronic constipation    Compression fx, lumbar spine (HCC)    DDD (degenerative disc disease), lumbar    Diverticulosis of colon    Dog bite(E906.0) 08/30/2011   GERD (gastroesophageal reflux disease)    Hepatic steatosis    pt denies   Hyperlipidemia    Hyperplastic colon polyp    IBS (irritable bowel syndrome)    constipation predominant   Internal hemorrhoids    Longstanding persistent atrial fibrillation (HCC)    Mitral regurgitation    Osteoporosis    dexa 2011 -2.7 hip nl spine   PMR (polymyalgia rheumatica) (HCC) 08/30/2011   under rheum care  on low dose pred 5 mg    Renal disorder    Syncope    Tricuspid regurgitation    Past Surgical History:  Procedure Laterality Date   ABDOMINAL HYSTERECTOMY     with bladder tact and rectocele repair   BLADDER SURGERY     Streched   CATARACT EXTRACTION  2010   both   COLONOSCOPY  2016 was most recent   CYSTO WITH HYDRODISTENSION N/A 10/05/2019   Procedure: CYSTOSCOPY/HYDRODISTENSION;  Surgeon: Gaston Hamilton, MD;  Location: [facility];  Service: Urology;  Laterality: N/A;   TONSILLECTOMY AND ADENOIDECTOMY     Patient Active Problem List   Diagnosis Date Noted   External  hemorrhoids 03/03/2020   Incomplete bladder emptying 11/29/2019   Acquired thrombophilia (HCC) 11/29/2019   Incomplete emptying of bladder 11/29/2019   Syncope 06/17/2019   Seasonal allergies 06/17/2019   DDD (degenerative disc disease), lumbar    CKD (chronic kidney disease), stage III (HCC)    Internal hemorrhoids    Dizziness 09/17/2018   AF (paroxysmal atrial fibrillation) (HCC) 09/17/2018   Chest discomfort 05/13/2018   Coagulation defect (HCC) 01/30/2018   Deviated septum 01/30/2018   Atrial fibrillation with RVR (HCC) 05/02/2017   Rhinitis, allergic 05/31/2015   Renal cyst incidental  01/19/2015   Heartburn 01/14/2014   GERD (gastroesophageal reflux disease) 01/14/2014   Osteoporosis 01/14/2014   IBS (irritable bowel syndrome)    Arthritis 02/19/2012   Screening, lipid 02/18/2011   Visit for preventive health examination 02/18/2011   Skin cancer 10/14/2010   Constipation, outlet dysfunction 01/15/2010   Lower urinary tract symptoms (LUTS) 01/15/2010   SUPRAPUBIC PAIN 08/01/2009   DIVERTICULOSIS, COLON 06/07/2007   Hyperlipidemia 06/05/2007   Anxiety 06/05/2007   Allergic rhinitis 06/05/2007   IBS 06/05/2007   OSTEOPOROSIS 06/05/2007    ONSET DATE: ~1 year  REFERRING DIAG: G21.4 (ICD-10-CM) - Vascular parkinsonism (HCC)  THERAPY  DIAG:  Unsteadiness on feet  Other abnormalities of gait and mobility  Muscle weakness (generalized)  Rationale for Evaluation and Treatment: Rehabilitation  SUBJECTIVE:                                                                                                                                                                                             SUBJECTIVE STATEMENT: Pt states she saw Dr. Evonnie who recommended PT for her balance. Reports she stumbles around -- feels it has worsened the past year. Doesn't feel safe walking around the circle in front of her home without supervision. Reports she doesn't trust herself going  up/down stairs. Daughter states pt always has someone at home with her. Has a sitter on Tuesdays and Thursdays and daughter is there the rest of the days. Daughter notes that pt likes to shop but now only able to just go to one store before she fatigues.  Pt accompanied by: family member, daughter  PERTINENT HISTORY: dementia, A-fib, chronic kidney disease  PAIN:  Are you having pain? No  PRECAUTIONS: Fall  RED FLAGS: None   WEIGHT BEARING RESTRICTIONS: No  FALLS: Has patient fallen in last 6 months? No  LIVING ENVIRONMENT: Lives with: lives with their daughter Lives in: House/apartment Stairs: No Has following equipment at home: Single point cane, Shower bench, Grab bars, and standard walker  PLOF: Independent  PATIENT GOALS: Reduce stumbling while walking  OBJECTIVE:  Note: Objective measures were completed at Evaluation unless otherwise noted.  DIAGNOSTIC FINDINGS: 10/08/23 Brain MRI IMPRESSION: 1.  No evidence of an acute intracranial abnormality. 2. Prominence of the lateral and third ventricles which is at least partly related to moderate generalized cerebral atrophy. However, an acute callosal angle is also noted and there is mild crowding of the sulci at the level of the high frontoparietal lobes, and a component of normal pressure hydrocephalus (NPH) cannot be excluded. 3. Moderate cerebral white matter chronic small vessel ischemic disease.  COGNITION: Overall cognitive status: worsening memory   SENSATION: WFL  COORDINATION: Did not test  EDEMA: None  MUSCLE TONE: Did not test  MUSCLE LENGTH: Did not test  DTRs: Did not test  POSTURE: rounded shoulders, forward head, and increased thoracic kyphosis  LOWER EXTREMITY ROM:     Active  Right Eval Left Eval  Hip flexion    Hip extension    Hip abduction    Hip adduction    Hip internal rotation    Hip external rotation    Knee flexion    Knee extension    Ankle dorsiflexion    Ankle  plantarflexion  Ankle inversion    Ankle eversion     (Blank rows = not tested)  LOWER EXTREMITY MMT:    MMT Right Eval Left Eval  Hip flexion 4- 4-  Hip extension 4 4  Hip abduction 3+ 3+  Hip adduction 4 4  Hip internal rotation    Hip external rotation    Knee flexion 4- 4-  Knee extension 5 5  Ankle dorsiflexion    Ankle plantarflexion 3+ 3+  Ankle inversion    Ankle eversion    (Blank rows = not tested)  BED MOBILITY:  Independent  TRANSFERS: Independent with some use of UEs for chair transfers  RAMP:  Not tested  CURB:  Not tested  STAIRS: Not tested  GAIT: Findings: Gait Characteristics: step through pattern, decreased step length- Right, decreased step length- Left, Right foot flat, Left foot flat, and shuffling, Distance walked: Into clinic, Level of assistance: SBA, and Comments: Gait speed: 0.66 m/s   FUNCTIONAL TESTS:  5 times sit to stand: 19.79 sec with hands on knees, decreased eccentric control with sitting down, required 2 attempts to stand on 3rd sit<>stand  [facility] PT Assessment - 10/15/23 0001       Standardized Balance Assessment   Standardized Balance Assessment Mini-BESTest;Timed Up and Go Test      Mini-BESTest   Sit To Stand Normal: Comes to stand without use of hands and stabilizes independently.    Rise to Toes Moderate: Heels up, but not full range (smaller than when holding hands), OR noticeable instability for 3 s.    Stand on one leg (left) Moderate: < 20 s    Stand on one leg (right) Moderate: < 20 s    Stand on one leg - lowest score 1    Compensatory Stepping Correction - Forward Normal: Recovers independently with a single, large step (second realignement is allowed).    Compensatory Stepping Correction - Backward No step, OR would fall if not caught, OR falls spontaneously.    Compensatory Stepping Correction - Left Lateral Moderate: Several steps to recover equilibrium    Compensatory Stepping Correction - Right Lateral  Moderate: Several steps to recover equilibrium    Stepping Corredtion Lateral - lowest score 1    Stance - Feet together, eyes open, firm surface  Normal: 30s    Stance - Feet together, eyes closed, foam surface  Severe: Unable    Incline - Eyes Closed Moderate: Stands independently < 30s OR aligns with surface    Change in Gait Speed Moderate: Unable to change walking speed or signs of imbalance    Walk with head turns - Horizontal Moderate: performs head turns with reduction in gait speed.    Walk with pivot turns Moderate:Turns with feet close SLOW (>4 steps) with good balance.    Step over obstacles Moderate: Steps over box but touches box OR displays cautious behavior by slowing gait.    Timed UP & GO with Dual Task Severe: Stops counting while walking OR stops walking while counting.    Mini-BEST total score 14      Timed Up and Go Test   Normal TUG (seconds) 18.41    Cognitive TUG (seconds) 42.97           PATIENT SURVEYS:  Did not assess  TREATMENT DATE: 10/15/23 No HEP initiated. Education provided only today.    PATIENT EDUCATION: Education details: Exam findings and interpretation of results, POC, PT needs Person educated: Patient and Child(ren) Education method: Explanation and Demonstration Education comprehension: verbalized understanding and needs further education  HOME EXERCISE PROGRAM: To be provided  GOALS: Goals reviewed with patient? Yes  SHORT TERM GOALS: Target date: 11/12/2023   Pt will be able to perform HEP with SBA from daughter/sitter Baseline: Goal status: INITIAL  2.  Pt will have improved controlled 5x STS while sitting down Baseline: Plops into chair, required multiple attempts to stand on 3rd sit to stand Goal status: INITIAL  3.  Pt will have improved TUG to </=15 sec to work towards decreasing fall  risk Baseline: 18.41 sec Goal status: INITIAL  LONG TERM GOALS: Target date: 12/10/2023   Pt will be consistent with her HEP with family/sitter SBA Baseline:  Goal status: INITIAL  2.  Pt will have improved 5x STS to </=13 sec for decreased fall risk and increased functional LE strength Baseline:  Goal status: INITIAL  3.  Pt will have improved MiniBesTEST to >/=19/28 to be considered a reduced risk as a recurrent faller Baseline: 14 Goal status: INITIAL  4.  Pt will have improved TUG to </=13.5 sec for reduced fall risk Baseline:  Goal status: INITIAL  5.  Pt will be able to improve gait speed to >/=0.8 m/s to be considered a community ambulator for shopping Baseline: 0.66 m/s Goal status: INITIAL   ASSESSMENT:  CLINICAL IMPRESSION: Patient is a 89 y.o. F who was seen today for physical therapy evaluation and treatment for vascular Parkinson's. PMH is significant for decreased memory, and a-fib. Assessment demos generalized LE weakness, decreased activity tolerance, and high fall risk based on TUG, gait speed and miniBesTEST. Pt will benefit from PT to improve on these issues for home and community safety.   OBJECTIVE IMPAIRMENTS: Abnormal gait, decreased activity tolerance, decreased balance, decreased endurance, decreased mobility, difficulty walking, decreased ROM, decreased strength, improper body mechanics, and postural dysfunction.   ACTIVITY LIMITATIONS: standing, squatting, stairs, transfers, bed mobility, and locomotion level  PARTICIPATION LIMITATIONS: meal prep, cleaning, laundry, shopping, and community activity  PERSONAL FACTORS: Age, Fitness, Past/current experiences, and Time since onset of injury/illness/exacerbation are also affecting patient's functional outcome.   REHAB POTENTIAL: Good  CLINICAL DECISION MAKING: Evolving/moderate complexity  EVALUATION COMPLEXITY: Moderate  PLAN:  PT FREQUENCY: 2x/week  PT DURATION: 8 weeks  PLANNED  INTERVENTIONS: [id number]- PT Re-evaluation, 97750- Physical Performance Testing, 97110-Therapeutic exercises, 97530- Therapeutic activity, [id number]- Neuromuscular re-education, 97535- Self Care, [id number]- Manual therapy, [id number]- Gait training, [id number]- Aquatic Therapy, [id number]- Electrical stimulation (unattended), Patient/Family education, Balance training, Stair training, Taping, Vestibular training, Cryotherapy, and Moist heat  PLAN FOR NEXT SESSION: Initiate strength and balance HEP. Consider exercises from [employer]. Work on dual tasking with cognitive tasks.    Kamilah Correia April Ma L Samuella Rasool, PT, DPT 10/15/2023, 2:10 PM

## 2023-10-22 ENCOUNTER — Encounter: Payer: Self-pay | Admitting: Physical Therapy

## 2023-10-22 ENCOUNTER — Ambulatory Visit: Admitting: Physical Therapy

## 2023-10-22 DIAGNOSIS — R2689 Other abnormalities of gait and mobility: Secondary | ICD-10-CM

## 2023-10-22 DIAGNOSIS — R2681 Unsteadiness on feet: Secondary | ICD-10-CM

## 2023-10-22 DIAGNOSIS — M5459 Other low back pain: Secondary | ICD-10-CM

## 2023-10-22 DIAGNOSIS — G214 Vascular parkinsonism: Secondary | ICD-10-CM | POA: Diagnosis not present

## 2023-10-22 DIAGNOSIS — M6281 Muscle weakness (generalized): Secondary | ICD-10-CM | POA: Diagnosis not present

## 2023-10-22 DIAGNOSIS — H5213 Myopia, bilateral: Secondary | ICD-10-CM | POA: Diagnosis not present

## 2023-10-22 DIAGNOSIS — M25551 Pain in right hip: Secondary | ICD-10-CM

## 2023-10-22 DIAGNOSIS — Z961 Presence of intraocular lens: Secondary | ICD-10-CM | POA: Diagnosis not present

## 2023-10-22 DIAGNOSIS — R262 Difficulty in walking, not elsewhere classified: Secondary | ICD-10-CM

## 2023-10-22 DIAGNOSIS — H353131 Nonexudative age-related macular degeneration, bilateral, early dry stage: Secondary | ICD-10-CM | POA: Diagnosis not present

## 2023-10-22 NOTE — Therapy (Signed)
 OUTPATIENT PHYSICAL THERAPY NEURO TREATMENT   Patient Name: Ashley Lawson MRN: [id number] DOB:02-24-1933, 89 y.o., female Today's Date: 10/22/2023   PCP: Leonarda Roxan BROCKS, NP REFERRING PROVIDER: Evonnie Asberry RAMAN, DO  END OF SESSION:  PT End of Session - 10/22/23 1624     Visit Number 2    Number of Visits 16    Date for PT Re-Evaluation 12/10/23    Authorization Type [employer] Medicare    Authorization Time Period approved 16 PT visits from 10/15/2023-12/10/2023    Authorization - Visit Number 1    Authorization - Number of Visits 16    Progress Note Due on Visit 10    PT Start Time 1620    PT Stop Time 1700    PT Time Calculation (min) 40 min    Activity Tolerance Patient tolerated treatment well           Past Medical History:  Diagnosis Date   Abdominal pain 12/2009   hospitalized   Allergic rhinitis    Anxiety    Bladder problem    Chronic constipation    Compression fx, lumbar spine (HCC)    DDD (degenerative disc disease), lumbar    Diverticulosis of colon    Dog bite(E906.0) 08/30/2011   GERD (gastroesophageal reflux disease)    Hepatic steatosis    pt denies   Hyperlipidemia    Hyperplastic colon polyp    IBS (irritable bowel syndrome)    constipation predominant   Internal hemorrhoids    Longstanding persistent atrial fibrillation (HCC)    Mitral regurgitation    Osteoporosis    dexa 2011 -2.7 hip nl spine   PMR (polymyalgia rheumatica) (HCC) 08/30/2011   under rheum care  on low dose pred 5 mg    Renal disorder    Syncope    Tricuspid regurgitation    Past Surgical History:  Procedure Laterality Date   ABDOMINAL HYSTERECTOMY     with bladder tact and rectocele repair   BLADDER SURGERY     Streched   CATARACT EXTRACTION  2010   both   COLONOSCOPY  2016 was most recent   CYSTO WITH HYDRODISTENSION N/A 10/05/2019   Procedure: CYSTOSCOPY/HYDRODISTENSION;  Surgeon: Gaston Hamilton, MD;  Location: [facility];  Service: Urology;  Laterality: N/A;    TONSILLECTOMY AND ADENOIDECTOMY     Patient Active Problem List   Diagnosis Date Noted   External hemorrhoids 03/03/2020   Incomplete bladder emptying 11/29/2019   Acquired thrombophilia (HCC) 11/29/2019   Incomplete emptying of bladder 11/29/2019   Syncope 06/17/2019   Seasonal allergies 06/17/2019   DDD (degenerative disc disease), lumbar    CKD (chronic kidney disease), stage III (HCC)    Internal hemorrhoids    Dizziness 09/17/2018   AF (paroxysmal atrial fibrillation) (HCC) 09/17/2018   Chest discomfort 05/13/2018   Coagulation defect (HCC) 01/30/2018   Deviated septum 01/30/2018   Atrial fibrillation with RVR (HCC) 05/02/2017   Rhinitis, allergic 05/31/2015   Renal cyst incidental  01/19/2015   Heartburn 01/14/2014   GERD (gastroesophageal reflux disease) 01/14/2014   Osteoporosis 01/14/2014   IBS (irritable bowel syndrome)    Arthritis 02/19/2012   Screening, lipid 02/18/2011   Visit for preventive health examination 02/18/2011   Skin cancer 10/14/2010   Constipation, outlet dysfunction 01/15/2010   Lower urinary tract symptoms (LUTS) 01/15/2010   SUPRAPUBIC PAIN 08/01/2009   DIVERTICULOSIS, COLON 06/07/2007   Hyperlipidemia 06/05/2007   Anxiety 06/05/2007   Allergic rhinitis 06/05/2007   IBS 06/05/2007  OSTEOPOROSIS 06/05/2007    ONSET DATE: ~1 year  REFERRING DIAG: G21.4 (ICD-10-CM) - Vascular parkinsonism (HCC)  THERAPY DIAG:  Unsteadiness on feet  Other abnormalities of gait and mobility  Muscle weakness (generalized)  Other low back pain  Pain in right hip  Difficulty in walking, not elsewhere classified  Rationale for Evaluation and Treatment: Rehabilitation  SUBJECTIVE:                                                                                                                                                                                             SUBJECTIVE STATEMENT:  Pt reports no falls. No pain or problems to report for today.    From eval: Pt states she saw Dr. Evonnie who recommended PT for her balance. Reports she stumbles around -- feels it has worsened the past year. Doesn't feel safe walking around the circle in front of her home without supervision. Reports she doesn't trust herself going up/down stairs. Daughter states pt always has someone at home with her. Has a sitter on Tuesdays and Thursdays and daughter is there the rest of the days. Daughter notes that pt likes to shop but now only able to just go to one store before she fatigues.  Pt accompanied by: family member, daughter  PERTINENT HISTORY: dementia, A-fib, chronic kidney disease  PAIN:  Are you having pain? No  PRECAUTIONS: Fall  WEIGHT BEARING RESTRICTIONS: No  FALLS: Has patient fallen in last 6 months? No  LIVING ENVIRONMENT: Lives with: lives with their daughter Lives in: House/apartment Stairs: No Has following equipment at home: Single point cane, Shower bench, Grab bars, and standard walker  PATIENT GOALS: Reduce stumbling while walking  OBJECTIVE:  Note: Objective measures were completed at Evaluation unless otherwise noted.  DIAGNOSTIC FINDINGS: 10/08/23 Brain MRI IMPRESSION: 1.  No evidence of an acute intracranial abnormality. 2. Prominence of the lateral and third ventricles which is at least partly related to moderate generalized cerebral atrophy. However, an acute callosal angle is also noted and there is mild crowding of the sulci at the level of the high frontoparietal lobes, and a component of normal pressure hydrocephalus (NPH) cannot be excluded. 3. Moderate cerebral white matter chronic small vessel ischemic disease.  COGNITION: Overall cognitive status: worsening memory  POSTURE: rounded shoulders, forward head, and increased thoracic kyphosis  LOWER EXTREMITY ROM:   Not tested  LOWER EXTREMITY MMT:    MMT Right Eval Left Eval  Hip flexion 4- 4-  Hip extension 4 4  Hip abduction 3+ 3+  Hip adduction 4 4   Hip internal rotation    Hip external rotation  Knee flexion 4- 4-  Knee extension 5 5  Ankle dorsiflexion    Ankle plantarflexion 3+ 3+  Ankle inversion    Ankle eversion    (Blank rows = not tested)  BED MOBILITY:  Independent  TRANSFERS: Independent with some use of UEs for chair transfers  GAIT: Findings: Gait Characteristics: step through pattern, decreased step length- Right, decreased step length- Left, Right foot flat, Left foot flat, and shuffling, Distance walked: Into clinic, Level of assistance: SBA, and Comments: Gait speed: 0.66 m/s   FUNCTIONAL TESTS:  5 times sit to stand: 19.79 sec with hands on knees, decreased eccentric control with sitting down, required 2 attempts to stand on 3rd sit<>stand     PATIENT SURVEYS:  Did not assess                                                                                                                              TREATMENT DATE:  10/22/23 Activity Comments  Nustep L5 x 8 min UEs/LEs. Pt reports it feels good.  Standing heel raise 2x10 UE support on counter  Side stepping at counter x 5 laps Bilat UE support  Backwards stepping at counter x 3 laps 1 UE support  Walking butt kicks x 3 laps   Feet together: EC static balance x 30  EC head turns x 30 EC head nods x30     More instability with head nods  Feet apart on airex: EO static balance x 30 EO head turns x 30  EO head nods x 30 EO feet together with head turns and heads x1 min with cognitive tasks (I.e. name girl/boy names starting with a certain letter)  CGA for safety  Min A for posterior LOB  Sit<>stand x10, x6 Reaching forward to counter; cues to extend hips fully once in standing. Tends to extend hips too early resulting in posterior LOB into chair      PATIENT EDUCATION: Education details: HEP Person educated: Patient and Child(ren) Education method: Explanation and Demonstration Education comprehension: verbalized understanding  and needs further education  HOME EXERCISE PROGRAM: Access Code: [email or web address] URL: [email or web address] Date: 10/22/2023 Prepared by: Chattie Greeson April Earnie Starring  Exercises - Heel Raises with Counter Support  - 1 x daily - 7 x weekly - 2 sets - 10 reps - Backward Walking with Counter Support  - 1 x daily - 7 x weekly - 5 sets - Side Stepping with Counter Support  - 1 x daily - 7 x weekly - 5 sets - Sit to Stand with Arm Reach Toward Target  - 1 x daily - 7 x weekly - 2 sets - 5 reps  GOALS: Goals reviewed with patient? Yes  SHORT TERM GOALS: Target date: 11/12/2023   Pt will be able to perform HEP with SBA from daughter/sitter Baseline: Goal status: INITIAL  2.  Pt will have improved controlled 5x STS while sitting down Baseline: Plops into chair, required multiple attempts to  stand on 3rd sit to stand Goal status: INITIAL  3.  Pt will have improved TUG to </=15 sec to work towards decreasing fall risk Baseline: 18.41 sec Goal status: INITIAL  LONG TERM GOALS: Target date: 12/10/2023   Pt will be consistent with her HEP with family/sitter SBA Baseline:  Goal status: INITIAL  2.  Pt will have improved 5x STS to </=13 sec for decreased fall risk and increased functional LE strength Baseline:  Goal status: INITIAL  3.  Pt will have improved MiniBesTEST to >/=19/28 to be considered a reduced risk as a recurrent faller Baseline: 14 Goal status: INITIAL  4.  Pt will have improved TUG to </=13.5 sec for reduced fall risk Baseline:  Goal status: INITIAL  5.  Pt will be able to improve gait speed to >/=0.8 m/s to be considered a community ambulator for shopping Baseline: 0.66 m/s Goal status: INITIAL   ASSESSMENT:  CLINICAL IMPRESSION: Ashley Lawson comes in today for her first PT treatment. Focused on initiating dynamic balance and strengthening HEP. Fatigues easily with exercises requiring rest breaks in between. Performed balance exercises on firm surface  well -- more challenged and posterior LOBs noted on foam and with head nodding exercise and cognitive task added.   From eval: Patient is a 89 y.o. F who was seen today for physical therapy evaluation and treatment for vascular Parkinson's. PMH is significant for decreased memory, and a-fib. Assessment demos generalized LE weakness, decreased activity tolerance, and high fall risk based on TUG, gait speed and miniBesTEST. Pt will benefit from PT to improve on these issues for home and community safety.   OBJECTIVE IMPAIRMENTS: Abnormal gait, decreased activity tolerance, decreased balance, decreased endurance, decreased mobility, difficulty walking, decreased ROM, decreased strength, improper body mechanics, and postural dysfunction.   ACTIVITY LIMITATIONS: standing, squatting, stairs, transfers, bed mobility, and locomotion level  PARTICIPATION LIMITATIONS: meal prep, cleaning, laundry, shopping, and community activity  PERSONAL FACTORS: Age, Fitness, Past/current experiences, and Time since onset of injury/illness/exacerbation are also affecting patient's functional outcome.   REHAB POTENTIAL: Good  CLINICAL DECISION MAKING: Evolving/moderate complexity  EVALUATION COMPLEXITY: Moderate  PLAN:  PT FREQUENCY: 2x/week  PT DURATION: 8 weeks  PLANNED INTERVENTIONS: 97164- PT Re-evaluation, 97750- Physical Performance Testing, 97110-Therapeutic exercises, 97530- Therapeutic activity, [id number]- Neuromuscular re-education, 97535- Self Care, [id number]- Manual therapy, [id number]- Gait training, [phone]- Aquatic Therapy, [phone]- Electrical stimulation (unattended), Patient/Family education, Balance training, Stair training, Taping, Vestibular training, Cryotherapy, and Moist heat  PLAN FOR NEXT SESSION: Continue strength and balance HEP. Work on dual tasking with cognitive tasks. Gait training   Brandilee Pies April Ma L Jacque Byron, PT, DPT 10/22/2023, 4:24 PM

## 2023-10-23 NOTE — Therapy (Signed)
 OUTPATIENT PHYSICAL THERAPY NEURO TREATMENT   Patient Name: Ashley Lawson MRN: [id number] DOB:[birth year], 89 y.o., female Today's Date: 10/24/2023   PCP: Leonarda Roxan BROCKS, NP REFERRING PROVIDER: Evonnie Asberry RAMAN, DO  END OF SESSION:  PT End of Session - 10/24/23 1132     Visit Number 3    Number of Visits 16    Date for PT Re-Evaluation 12/10/23    Authorization Type [employer] Medicare    Authorization Time Period approved 16 PT visits from 10/15/2023-12/10/2023    Authorization - Visit Number 2    Authorization - Number of Visits 16    Progress Note Due on Visit 10    PT Start Time 1102    PT Stop Time 1142    PT Time Calculation (min) 40 min    Equipment Utilized During Treatment Gait belt    Activity Tolerance Patient tolerated treatment well    Behavior During Therapy WFL for tasks assessed/performed            Past Medical History:  Diagnosis Date   Abdominal pain 12/2009   hospitalized   Allergic rhinitis    Anxiety    Bladder problem    Chronic constipation    Compression fx, lumbar spine (HCC)    DDD (degenerative disc disease), lumbar    Diverticulosis of colon    Dog bite(E906.0) 08/30/2011   GERD (gastroesophageal reflux disease)    Hepatic steatosis    pt denies   Hyperlipidemia    Hyperplastic colon polyp    IBS (irritable bowel syndrome)    constipation predominant   Internal hemorrhoids    Longstanding persistent atrial fibrillation (HCC)    Mitral regurgitation    Osteoporosis    dexa 2011 -2.7 hip nl spine   PMR (polymyalgia rheumatica) (HCC) 08/30/2011   under rheum care  on low dose pred 5 mg    Renal disorder    Syncope    Tricuspid regurgitation    Past Surgical History:  Procedure Laterality Date   ABDOMINAL HYSTERECTOMY     with bladder tact and rectocele repair   BLADDER SURGERY     Streched   CATARACT EXTRACTION  2010   both   COLONOSCOPY  2016 was most recent   CYSTO WITH HYDRODISTENSION N/A 10/05/2019   Procedure:  CYSTOSCOPY/HYDRODISTENSION;  Surgeon: Gaston Hamilton, MD;  Location: [facility];  Service: Urology;  Laterality: N/A;   TONSILLECTOMY AND ADENOIDECTOMY     Patient Active Problem List   Diagnosis Date Noted   External hemorrhoids 03/03/2020   Incomplete bladder emptying 11/29/2019   Acquired thrombophilia (HCC) 11/29/2019   Incomplete emptying of bladder 11/29/2019   Syncope 06/17/2019   Seasonal allergies 06/17/2019   DDD (degenerative disc disease), lumbar    CKD (chronic kidney disease), stage III (HCC)    Internal hemorrhoids    Dizziness 09/17/2018   AF (paroxysmal atrial fibrillation) (HCC) 09/17/2018   Chest discomfort 05/13/2018   Coagulation defect (HCC) 01/30/2018   Deviated septum 01/30/2018   Atrial fibrillation with RVR (HCC) 05/02/2017   Rhinitis, allergic 05/31/2015   Renal cyst incidental  01/19/2015   Heartburn 01/14/2014   GERD (gastroesophageal reflux disease) 01/14/2014   Osteoporosis 01/14/2014   IBS (irritable bowel syndrome)    Arthritis 02/19/2012   Screening, lipid 02/18/2011   Visit for preventive health examination 02/18/2011   Skin cancer 10/14/2010   Constipation, outlet dysfunction 01/15/2010   Lower urinary tract symptoms (LUTS) 01/15/2010   SUPRAPUBIC PAIN 08/01/2009   DIVERTICULOSIS,  COLON 06/07/2007   Hyperlipidemia 06/05/2007   Anxiety 06/05/2007   Allergic rhinitis 06/05/2007   IBS 06/05/2007   OSTEOPOROSIS 06/05/2007    ONSET DATE: ~1 year  REFERRING DIAG: G21.4 (ICD-10-CM) - Vascular parkinsonism (HCC)  THERAPY DIAG:  Unsteadiness on feet  Other abnormalities of gait and mobility  Muscle weakness (generalized)  Rationale for Evaluation and Treatment: Rehabilitation  SUBJECTIVE:                                                                                                                                                                                             SUBJECTIVE STATEMENT: I am so tired today. She reports that  she has been practicing her HEP and denies questions/concerns.   From eval: Pt states she saw Dr. Evonnie who recommended PT for her balance. Reports she stumbles around -- feels it has worsened the past year. Doesn't feel safe walking around the circle in front of her home without supervision. Reports she doesn't trust herself going up/down stairs. Daughter states pt always has someone at home with her. Has a sitter on Tuesdays and Thursdays and daughter is there the rest of the days. Daughter notes that pt likes to shop but now only able to just go to one store before she fatigues.  Pt accompanied by: family member, daughter  PERTINENT HISTORY: dementia, A-fib, chronic kidney disease  PAIN:  Are you having pain? No  PRECAUTIONS: Fall  WEIGHT BEARING RESTRICTIONS: No  FALLS: Has patient fallen in last 6 months? No  LIVING ENVIRONMENT: Lives with: lives with their daughter Lives in: House/apartment Stairs: No Has following equipment at home: Single point cane, Shower bench, Grab bars, and standard walker  PATIENT GOALS: Reduce stumbling while walking  OBJECTIVE:       TODAY'S TREATMENT: 10/24/23 Activity Comments  Nustep L3 x 6 min UEs/LEs  Cueing to maintain amplitude of movement throughout   STS without UE support Tendency for posterior LOB back into chair or LEs pushing into back of chair   STS ; forward lean to pick up cone from floor, then reaching overhead to place cone on desk table Good success  STS without UE support with focus on increased forward lean Good success and carryover   gait training ~259ftx2 Focus on larger and symmetric step length,m reciprocal arm swing. Pt with a few instances of LOB with CGA-min. Pt fatigued by end of walk  mini squat 2x10  Demo for proper form  Standing hip ABD 2x10  Good form   Sidestepping loop around ankles 2x1 min Reduced L step length, tendency to slide feet  HOME EXERCISE PROGRAM: Access Code: [email or web address] URL:  [email or web address] Date: 10/22/2023 Prepared by: Gellen April Earnie Starring  Exercises - Heel Raises with Counter Support  - 1 x daily - 7 x weekly - 2 sets - 10 reps - Backward Walking with Counter Support  - 1 x daily - 7 x weekly - 5 sets - Side Stepping with Counter Support  - 1 x daily - 7 x weekly - 5 sets - Sit to Stand with Arm Reach Toward Target  - 1 x daily - 7 x weekly - 2 sets - 5 reps     Note: Objective measures were completed at Evaluation unless otherwise noted.  DIAGNOSTIC FINDINGS: 10/08/23 Brain MRI IMPRESSION: 1.  No evidence of an acute intracranial abnormality. 2. Prominence of the lateral and third ventricles which is at least partly related to moderate generalized cerebral atrophy. However, an acute callosal angle is also noted and there is mild crowding of the sulci at the level of the high frontoparietal lobes, and a component of normal pressure hydrocephalus (NPH) cannot be excluded. 3. Moderate cerebral white matter chronic small vessel ischemic disease.  COGNITION: Overall cognitive status: worsening memory  POSTURE: rounded shoulders, forward head, and increased thoracic kyphosis  LOWER EXTREMITY ROM:   Not tested  LOWER EXTREMITY MMT:    MMT Right Eval Left Eval  Hip flexion 4- 4-  Hip extension 4 4  Hip abduction 3+ 3+  Hip adduction 4 4  Hip internal rotation    Hip external rotation    Knee flexion 4- 4-  Knee extension 5 5  Ankle dorsiflexion    Ankle plantarflexion 3+ 3+  Ankle inversion    Ankle eversion    (Blank rows = not tested)  BED MOBILITY:  Independent  TRANSFERS: Independent with some use of UEs for chair transfers  GAIT: Findings: Gait Characteristics: step through pattern, decreased step length- Right, decreased step length- Left, Right foot flat, Left foot flat, and shuffling, Distance walked: Into clinic, Level of assistance: SBA, and Comments: Gait speed: 0.66 m/s   FUNCTIONAL TESTS:  5  times sit to stand: 19.79 sec with hands on knees, decreased eccentric control with sitting down, required 2 attempts to stand on 3rd sit<>stand     PATIENT SURVEYS:  Did not assess    PATIENT EDUCATION: Education details: HEP Person educated: Patient and Child(ren) Education method: Explanation and Demonstration Education comprehension: verbalized understanding and needs further education   GOALS: Goals reviewed with patient? Yes  SHORT TERM GOALS: Target date: 11/12/2023   Pt will be able to perform HEP with SBA from daughter/sitter Baseline: pt reports understanding 10/24/23 Goal status: MET 10/24/23  2.  Pt will have improved controlled 5x STS while sitting down Baseline: Plops into chair, required multiple attempts to stand on 3rd sit to stand Goal status: IN PROGRESS  3.  Pt will have improved TUG to </=15 sec to work towards decreasing fall risk Baseline: 18.41 sec Goal status: IN PROGRESS  LONG TERM GOALS: Target date: 12/10/2023   Pt will be consistent with her HEP with family/sitter SBA Baseline:  Goal status: IN PROGRESS  2.  Pt will have improved 5x STS to </=13 sec for decreased fall risk and increased functional LE strength Baseline:  Goal status: IN PROGRESS  3.  Pt will have improved MiniBesTEST to >/=19/28 to be considered a reduced risk as a recurrent faller Baseline: 14 Goal status: IN PROGRESS  4.  Pt will have improved TUG to </=13.5  sec for reduced fall risk Baseline:  Goal status: IN PROGRESS  5.  Pt will be able to improve gait speed to >/=0.8 m/s to be considered a Tourist information centre manager for shopping Baseline: 0.66 m/s Goal status: IN PROGRESS   ASSESSMENT:  CLINICAL IMPRESSION: Patient arrived to session with report of feeling tired today. Session focused on transfer training, gait training, and functional strengthening activities. Patient requires cues for increased anterior trunk lean with transfers, longer step length and arm  swing with gait, and symmetric step length with sidestepping. Patient fatigues after several minutes of activity, requiring short rest breaks to address. Patient reported "I feel better" upon leaving.   From eval: Patient is a 89 y.o. F who was seen today for physical therapy evaluation and treatment for vascular Parkinson's. PMH is significant for decreased memory, and a-fib. Assessment demos generalized LE weakness, decreased activity tolerance, and high fall risk based on TUG, gait speed and miniBesTEST. Pt will benefit from PT to improve on these issues for home and community safety.   OBJECTIVE IMPAIRMENTS: Abnormal gait, decreased activity tolerance, decreased balance, decreased endurance, decreased mobility, difficulty walking, decreased ROM, decreased strength, improper body mechanics, and postural dysfunction.   ACTIVITY LIMITATIONS: standing, squatting, stairs, transfers, bed mobility, and locomotion level  PARTICIPATION LIMITATIONS: meal prep, cleaning, laundry, shopping, and community activity  PERSONAL FACTORS: Age, Fitness, Past/current experiences, and Time since onset of injury/illness/exacerbation are also affecting patient's functional outcome.   REHAB POTENTIAL: Good  CLINICAL DECISION MAKING: Evolving/moderate complexity  EVALUATION COMPLEXITY: Moderate  PLAN:  PT FREQUENCY: 2x/week  PT DURATION: 8 weeks  PLANNED INTERVENTIONS: 97164- PT Re-evaluation, 97750- Physical Performance Testing, 97110-Therapeutic exercises, 97530- Therapeutic activity, [id number]- Neuromuscular re-education, 97535- Self Care, 02859- Manual therapy, [id number]- Gait training, [phone]- Aquatic Therapy, [phone]- Electrical stimulation (unattended), Patient/Family education, Balance training, Stair training, Taping, Vestibular training, Cryotherapy, and Moist heat  PLAN FOR NEXT SESSION: Continue strength and balance HEP. Work on dual tasking with cognitive tasks. Gait training   Louana Terrilyn Christians,  PT, DPT 10/24/23 11:52 AM  [facility] [address] [place], [address] [place], [place] [address] Phone # [phone] Fax # [phone]

## 2023-10-24 ENCOUNTER — Encounter: Payer: Self-pay | Admitting: Physical Therapy

## 2023-10-24 ENCOUNTER — Ambulatory Visit: Admitting: Physical Therapy

## 2023-10-24 DIAGNOSIS — R2689 Other abnormalities of gait and mobility: Secondary | ICD-10-CM | POA: Diagnosis not present

## 2023-10-24 DIAGNOSIS — M5459 Other low back pain: Secondary | ICD-10-CM | POA: Diagnosis not present

## 2023-10-24 DIAGNOSIS — R2681 Unsteadiness on feet: Secondary | ICD-10-CM

## 2023-10-24 DIAGNOSIS — M25551 Pain in right hip: Secondary | ICD-10-CM | POA: Diagnosis not present

## 2023-10-24 DIAGNOSIS — M6281 Muscle weakness (generalized): Secondary | ICD-10-CM | POA: Diagnosis not present

## 2023-10-24 DIAGNOSIS — G214 Vascular parkinsonism: Secondary | ICD-10-CM | POA: Diagnosis not present

## 2023-10-24 DIAGNOSIS — R262 Difficulty in walking, not elsewhere classified: Secondary | ICD-10-CM | POA: Diagnosis not present

## 2023-10-24 NOTE — Therapy (Signed)
 OUTPATIENT PHYSICAL THERAPY NEURO TREATMENT   Patient Name: EMMY KENG MRN: [id number] DOB:16-Jul-1932, 89 y.o., female Today's Date: 10/27/2023   PCP: Leonarda Roxan BROCKS, NP REFERRING PROVIDER: Evonnie Asberry RAMAN, DO  END OF SESSION:  PT End of Session - 10/27/23 1606     Visit Number 4    Number of Visits 16    Date for PT Re-Evaluation 12/10/23    Authorization Type [employer] Medicare    Authorization Time Period approved 16 PT visits from 10/15/2023-12/10/2023    Authorization - Visit Number 3    Authorization - Number of Visits 16    Progress Note Due on Visit 10    PT Start Time 1531    PT Stop Time 1613    PT Time Calculation (min) 42 min    Equipment Utilized During Treatment Gait belt    Activity Tolerance Patient tolerated treatment well    Behavior During Therapy WFL for tasks assessed/performed            Past Medical History:  Diagnosis Date   Abdominal pain 12/2009   hospitalized   Allergic rhinitis    Anxiety    Bladder problem    Chronic constipation    Compression fx, lumbar spine (HCC)    DDD (degenerative disc disease), lumbar    Diverticulosis of colon    Dog bite(E906.0) 08/30/2011   GERD (gastroesophageal reflux disease)    Hepatic steatosis    pt denies   Hyperlipidemia    Hyperplastic colon polyp    IBS (irritable bowel syndrome)    constipation predominant   Internal hemorrhoids    Longstanding persistent atrial fibrillation (HCC)    Mitral regurgitation    Osteoporosis    dexa 2011 -2.7 hip nl spine   PMR (polymyalgia rheumatica) (HCC) 08/30/2011   under rheum care  on low dose pred 5 mg    Renal disorder    Syncope    Tricuspid regurgitation    Past Surgical History:  Procedure Laterality Date   ABDOMINAL HYSTERECTOMY     with bladder tact and rectocele repair   BLADDER SURGERY     Streched   CATARACT EXTRACTION  2010   both   COLONOSCOPY  2016 was most recent   CYSTO WITH HYDRODISTENSION N/A 10/05/2019   Procedure:  CYSTOSCOPY/HYDRODISTENSION;  Surgeon: Gaston Hamilton, MD;  Location: [facility];  Service: Urology;  Laterality: N/A;   TONSILLECTOMY AND ADENOIDECTOMY     Patient Active Problem List   Diagnosis Date Noted   External hemorrhoids 03/03/2020   Incomplete bladder emptying 11/29/2019   Acquired thrombophilia (HCC) 11/29/2019   Incomplete emptying of bladder 11/29/2019   Syncope 06/17/2019   Seasonal allergies 06/17/2019   DDD (degenerative disc disease), lumbar    CKD (chronic kidney disease), stage III (HCC)    Internal hemorrhoids    Dizziness 09/17/2018   AF (paroxysmal atrial fibrillation) (HCC) 09/17/2018   Chest discomfort 05/13/2018   Coagulation defect (HCC) 01/30/2018   Deviated septum 01/30/2018   Atrial fibrillation with RVR (HCC) 05/02/2017   Rhinitis, allergic 05/31/2015   Renal cyst incidental  01/19/2015   Heartburn 01/14/2014   GERD (gastroesophageal reflux disease) 01/14/2014   Osteoporosis 01/14/2014   IBS (irritable bowel syndrome)    Arthritis 02/19/2012   Screening, lipid 02/18/2011   Visit for preventive health examination 02/18/2011   Skin cancer 10/14/2010   Constipation, outlet dysfunction 01/15/2010   Lower urinary tract symptoms (LUTS) 01/15/2010   SUPRAPUBIC PAIN 08/01/2009   DIVERTICULOSIS,  COLON 06/07/2007   Hyperlipidemia 06/05/2007   Anxiety 06/05/2007   Allergic rhinitis 06/05/2007   IBS 06/05/2007   OSTEOPOROSIS 06/05/2007    ONSET DATE: ~1 year  REFERRING DIAG: G21.4 (ICD-10-CM) - Vascular parkinsonism (HCC)  THERAPY DIAG:  Unsteadiness on feet  Other abnormalities of gait and mobility  Muscle weakness (generalized)  Rationale for Evaluation and Treatment: Rehabilitation  SUBJECTIVE:                                                                                                                                                                                             SUBJECTIVE STATEMENT: Nothing new that I know of. Denies  recent falls.  From eval: Pt states she saw Dr. Evonnie who recommended PT for her balance. Reports she stumbles around -- feels it has worsened the past year. Doesn't feel safe walking around the circle in front of her home without supervision. Reports she doesn't trust herself going up/down stairs. Daughter states pt always has someone at home with her. Has a sitter on Tuesdays and Thursdays and daughter is there the rest of the days. Daughter notes that pt likes to shop but now only able to just go to one store before she fatigues.  Pt accompanied by: family member, daughter  PERTINENT HISTORY: dementia, A-fib, chronic kidney disease  PAIN:  Are you having pain? No  PRECAUTIONS: Fall  WEIGHT BEARING RESTRICTIONS: No  FALLS: Has patient fallen in last 6 months? No  LIVING ENVIRONMENT: Lives with: lives with their daughter Lives in: House/apartment Stairs: No Has following equipment at home: Single point cane, Shower bench, Grab bars, and standard walker  PATIENT GOALS: Reduce stumbling while walking  OBJECTIVE:       TODAY'S TREATMENT: 10/27/23 Activity Comments  Nustep L4 x 6 min UEs/LE  Cueing to maintain amplitude   STS with cueing for increased anterior trunk lean 10x  Tendency to stand with LEs crouched and pushing into chair   fwd/back stepping over hurdle 10x each LE 1 UE support; cues to avoid circumduction, step long and tall enough   alt side step over hurdle 10x each  1 UE support; cueing for foot placement   staggered ant/pos wt shift, then added alt UE swing while maintaining 1 UE on TM rail Pt required verbal and manual cueing to incorporate UE swing throughout. Loses amplitude of wt shift when adding in arm swing   heel/toe raises Posterior lean d/t difficulty performing B DF  Walking on heels/toes  Cueing to maintain amplitude with 1 UE support   Gait + ball toss  Cues for continuous, symmetric steps. CGA for  safety. Short sitting rest breaks d/t fatigue    backwards walking More difficulty maintaining step length. Tendency for step-to pattern      HOME EXERCISE PROGRAM: Access Code: [email or web address] URL: [email or web address] Date: 10/22/2023 Prepared by: Gellen April Earnie Starring  Exercises - Heel Raises with Counter Support  - 1 x daily - 7 x weekly - 2 sets - 10 reps - Backward Walking with Counter Support  - 1 x daily - 7 x weekly - 5 sets - Side Stepping with Counter Support  - 1 x daily - 7 x weekly - 5 sets - Sit to Stand with Arm Reach Toward Target  - 1 x daily - 7 x weekly - 2 sets - 5 reps     Note: Objective measures were completed at Evaluation unless otherwise noted.  DIAGNOSTIC FINDINGS: 10/08/23 Brain MRI IMPRESSION: 1.  No evidence of an acute intracranial abnormality. 2. Prominence of the lateral and third ventricles which is at least partly related to moderate generalized cerebral atrophy. However, an acute callosal angle is also noted and there is mild crowding of the sulci at the level of the high frontoparietal lobes, and a component of normal pressure hydrocephalus (NPH) cannot be excluded. 3. Moderate cerebral white matter chronic small vessel ischemic disease.  COGNITION: Overall cognitive status: worsening memory  POSTURE: rounded shoulders, forward head, and increased thoracic kyphosis  LOWER EXTREMITY ROM:   Not tested  LOWER EXTREMITY MMT:    MMT Right Eval Left Eval  Hip flexion 4- 4-  Hip extension 4 4  Hip abduction 3+ 3+  Hip adduction 4 4  Hip internal rotation    Hip external rotation    Knee flexion 4- 4-  Knee extension 5 5  Ankle dorsiflexion    Ankle plantarflexion 3+ 3+  Ankle inversion    Ankle eversion    (Blank rows = not tested)  BED MOBILITY:  Independent  TRANSFERS: Independent with some use of UEs for chair transfers  GAIT: Findings: Gait Characteristics: step through pattern, decreased step length- Right, decreased step length- Left, Right foot flat,  Left foot flat, and shuffling, Distance walked: Into clinic, Level of assistance: SBA, and Comments: Gait speed: 0.66 m/s   FUNCTIONAL TESTS:  5 times sit to stand: 19.79 sec with hands on knees, decreased eccentric control with sitting down, required 2 attempts to stand on 3rd sit<>stand     PATIENT SURVEYS:  Did not assess    PATIENT EDUCATION: Education details: HEP Person educated: Patient and Child(ren) Education method: Explanation and Demonstration Education comprehension: verbalized understanding and needs further education   GOALS: Goals reviewed with patient? Yes  SHORT TERM GOALS: Target date: 11/12/2023   Pt will be able to perform HEP with SBA from daughter/sitter Baseline: Goal status: IN PROGRESS  2.  Pt will have improved controlled 5x STS while sitting down Baseline: Plops into chair, required multiple attempts to stand on 3rd sit to stand Goal status: IN PROGRESS  3.  Pt will have improved TUG to </=15 sec to work towards decreasing fall risk Baseline: 18.41 sec Goal status: IN PROGRESS  LONG TERM GOALS: Target date: 12/10/2023   Pt will be consistent with her HEP with family/sitter SBA Baseline:  Goal status: IN PROGRESS  2.  Pt will have improved 5x STS to </=13 sec for decreased fall risk and increased functional LE strength Baseline:  Goal status: IN PROGRESS  3.  Pt will have improved MiniBesTEST to >/=19/28 to be considered a reduced  risk as a recurrent faller Baseline: 14 Goal status: IN PROGRESS  4.  Pt will have improved TUG to </=13.5 sec for reduced fall risk Baseline:  Goal status: IN PROGRESS  5.  Pt will be able to improve gait speed to >/=0.8 m/s to be considered a Tourist information centre manager for shopping Baseline: 0.66 m/s Goal status: IN PROGRESS   ASSESSMENT:  CLINICAL IMPRESSION: Patient arrived to session without complaints. Reviewed technique with STS transfer to encourage increased trunk lean from last session.  Patient still with tendency to lean back into chair upon standing up. Patient performed dynamic balance activities with stepping over obstacles, weight shifting, and backwards walking. Patient required cues to encourage step height and length and to maintain amplitude of movements, especially when coordinating more than one thing at a time. Patient reported fatigue after several gait activities, requiring short sitting rest breaks. No complaints at end of session.   From eval: Patient is a 89 y.o. F who was seen today for physical therapy evaluation and treatment for vascular Parkinson's. PMH is significant for decreased memory, and a-fib. Assessment demos generalized LE weakness, decreased activity tolerance, and high fall risk based on TUG, gait speed and miniBesTEST. Pt will benefit from PT to improve on these issues for home and community safety.   OBJECTIVE IMPAIRMENTS: Abnormal gait, decreased activity tolerance, decreased balance, decreased endurance, decreased mobility, difficulty walking, decreased ROM, decreased strength, improper body mechanics, and postural dysfunction.   ACTIVITY LIMITATIONS: standing, squatting, stairs, transfers, bed mobility, and locomotion level  PARTICIPATION LIMITATIONS: meal prep, cleaning, laundry, shopping, and community activity  PERSONAL FACTORS: Age, Fitness, Past/current experiences, and Time since onset of injury/illness/exacerbation are also affecting patient's functional outcome.   REHAB POTENTIAL: Good  CLINICAL DECISION MAKING: Evolving/moderate complexity  EVALUATION COMPLEXITY: Moderate  PLAN:  PT FREQUENCY: 2x/week  PT DURATION: 8 weeks  PLANNED INTERVENTIONS: 97164- PT Re-evaluation, 97750- Physical Performance Testing, 97110-Therapeutic exercises, 97530- Therapeutic activity, [id number]- Neuromuscular re-education, 97535- Self Care, [id number]- Manual therapy, [id number]- Gait training, [phone]- Aquatic Therapy, [phone]- Electrical stimulation (unattended),  Patient/Family education, Balance training, Stair training, Taping, Vestibular training, Cryotherapy, and Moist heat  PLAN FOR NEXT SESSION: Continue strength and balance HEP. Work on dual tasking with cognitive tasks. Gait training   Louana Terrilyn Christians, PT, DPT 10/27/23 4:14 PM  [facility] [address], [address] [place], [place] [address] Phone # [phone] Fax # [phone]

## 2023-10-27 ENCOUNTER — Ambulatory Visit: Admitting: Physical Therapy

## 2023-10-27 ENCOUNTER — Other Ambulatory Visit: Payer: Self-pay | Admitting: Family

## 2023-10-27 ENCOUNTER — Encounter: Payer: Self-pay | Admitting: Physical Therapy

## 2023-10-27 DIAGNOSIS — M5459 Other low back pain: Secondary | ICD-10-CM | POA: Diagnosis not present

## 2023-10-27 DIAGNOSIS — M25551 Pain in right hip: Secondary | ICD-10-CM | POA: Diagnosis not present

## 2023-10-27 DIAGNOSIS — R2681 Unsteadiness on feet: Secondary | ICD-10-CM

## 2023-10-27 DIAGNOSIS — M6281 Muscle weakness (generalized): Secondary | ICD-10-CM

## 2023-10-27 DIAGNOSIS — R2689 Other abnormalities of gait and mobility: Secondary | ICD-10-CM | POA: Diagnosis not present

## 2023-10-27 DIAGNOSIS — F411 Generalized anxiety disorder: Secondary | ICD-10-CM

## 2023-10-27 DIAGNOSIS — G214 Vascular parkinsonism: Secondary | ICD-10-CM | POA: Diagnosis not present

## 2023-10-27 DIAGNOSIS — R262 Difficulty in walking, not elsewhere classified: Secondary | ICD-10-CM | POA: Diagnosis not present

## 2023-10-28 NOTE — Progress Notes (Unsigned)
 Assessment/Plan:   1.  Abnormal brain scan  - I reviewed her films with her.  I am not all that convinced of a diagnosis of NPH, but certainly gave them the option of a lumbar puncture.  If that were positive, then the definitive treatment would be a shunting procedure.  Discussed that this often does not reverse memory change, and there is some significant change here.  After discussion with patient and her daughter, neither wanted to proceed with lumbar puncture.  They can certainly let us  know if they change their mind.  2.  Memory change  - Likely dementia of the Alzheimer's type, but vascular dementia cannot be ruled out.  Her daughter asked about more detailed testing and we did discuss neurocognitive testing.  It was offered today.  Patient declined for now.  - MRI brain done, as above.  - Family is doing a great job with caregiving.  - Given degree of memory loss, we did discuss that patient probably should have 24 hour/day caregiving at this point.  We talked about how to put that together.  Patient does not need skilled nursing.  Was given information to [facility], as well as discussing alternative solutions for caregiving.  Family was appreciative.  - At this point, I am not sure how helpful NMDA receptor antagonists or acetylcholinesterase inhibitors will be, given degree of memory change.  Decided to hold on those for now.  - Daughter did ask if I thought she had Lewy body disease and I do not.  3.  I will plan on seeing her back in about 6 months, sooner should new neurologic issues arise.  Subjective:   Ashley Lawson was seen today to review her MRI brain.  She was seen in our clinic last month.  Radiology noted prominence of her ventricular system and felt that somewhat of that was related atrophy and felt that there could be a component of NPH.  She had a head CT November, 2024 and that radiologist felt that there was no hydrocephalus.  Comparing the two  grossly, the ventricular size looks about the same between those two films.  However, one is comparing a CT and an MRI.  We had originally done the MRI to look for small vessel disease.  There was moderate small vessel disease noted, although not a lot in the basal ganglia.  I personally reviewed those films.  Patient has just recently started physical therapy.  Notes are reviewed.   ALLERGIES:   Allergies  Allergen Reactions   Alendronate Sodium     upset stomach   Penicillins     Has patient had a PCN reaction causing immediate rash, facial/tongue/throat swelling, SOB or lightheadedness with hypotension: Yes Has patient had a PCN reaction causing severe rash involving mucus membranes or skin necrosis: No Has patient had a PCN reaction that required hospitalization: No Has patient had a PCN reaction occurring within the last 10 years: No If all of the above answers are NO, then may proceed with Cephalosporin use.    Ciprofloxacin      Had headaches; swelling of feet.    Amoxicillin Rash    Has patient had a PCN reaction causing immediate rash, facial/tongue/throat swelling, SOB or lightheadedness with hypotension: yes Has patient had a PCN reaction causing severe rash involving mucus membranes or skin necrosis: no Has patient had a PCN reaction that required hospitalization: no Has patient had PCN reaction within the last 10 years: no  If  all of the above answers are NO, then may proceed with Cephalosporin use.  REACTION: unspecified   Metronidazole  Other (See Comments)    Pounding headaches patient says was bad.  With cipro  for diverticulitis rx. Other reaction(s): Other (See Comments) Pounding headaches patient says was bad.  With cipro  for diverticulitis rx.    CURRENT MEDICATIONS:  Current Outpatient Medications  Medication Instructions   acetaminophen  (TYLENOL ) 500 mg, Daily at bedtime   apixaban  (ELIQUIS ) 5 mg, Oral, 2 times daily   calcium  carbonate (OS-CAL) 1250 (500  Ca) MG chewable tablet 1 tablet, Daily   diltiazem  (CARDIZEM  CD) 120 MG 24 hr capsule TAKE 1 CAPSULE BY MOUTH EVERY DAY   escitalopram  (LEXAPRO ) 10 MG tablet TAKE 1 TABLET BY MOUTH EVERYDAY AT BEDTIME   fluticasone  (FLONASE ) 50 MCG/ACT nasal spray INHALE 2 SPRAYS IN EACH NOSTRIL DAILY X 1 WEEK, THEN 1-2 SPRAYS DAILY. USE LOWEST POSSIBLE DOSE AFTER 1 WEEK   guaiFENesin  (MUCINEX ) 600 mg, Daily PRN   loratadine  (CLARITIN ) 10 mg, Oral, Daily   Multiple Vitamin (MULTIVITAMIN) tablet 1 tablet, Daily   polyethylene glycol powder (GLYCOLAX /MIRALAX ) 17 g, Oral, Daily, Hold for loose stool   PREMARIN vaginal cream INSERT 1 GRAM VAGINALLY THREE TIMES A WEEKLY, THEN ONCE WEEKLY   Probiotic Product (ALIGN) 4 MG CAPS 1 capsule, As needed   rosuvastatin  (CRESTOR ) 5 mg, Oral, Daily   vitamin C 500 mg, Daily   XIIDRA 5 % SOLN 1 drop, 2 times daily    Objective:   PHYSICAL EXAMINATION:    VITALS:   Vitals:   10/29/23 1526  BP: 126/72  Pulse: 77  SpO2: 99%  Weight: 164 lb (74.4 kg)     GEN:  The patient appears stated age and is in NAD. HEENT:  Normocephalic, atraumatic.  The mucous membranes are moist. The superficial temporal arteries are without ropiness or tenderness. CV:  RRR Lungs:  CTAB Neck/HEME:  There are no carotid bruits bilaterally.  Neurological examination:  Orientation:     09/15/2023   12:00 PM  Montreal Cognitive Assessment   Visuospatial/ Executive (0/5) 0  Naming (0/3) 1  Attention: Read list of digits (0/2) 2  Attention: Read list of letters (0/1) 1  Attention: Serial 7 subtraction starting at 100 (0/3) 0  Language: Repeat phrase (0/2) 1  Language : Fluency (0/1) 1  Abstraction (0/2) 0  Delayed Recall (0/5) 1  Orientation (0/6) 4  Total 11  Adjusted Score (based on education) 12    Cranial nerves: There is good facial symmetry.  Extraocular muscles are intact. The visual fields are full to confrontational testing. The speech is fluent and clear. Soft palate  rises symmetrically and there is no tongue deviation. Hearing is intact to conversational tone. Sensation: Sensation is intact to light touch throughout  Motor: Strength is at least antigravity x 4.  Movement examination: Tone: There is nl tone in the bilateral upper extremities.  The tone in the lower extremities is nl.  Abnormal movements: no rest tremor.  No postural or intention tremor Coordination:  There is no decremation with RAM's, with any form of RAMS, including alternating supination and pronation of the forearm, hand opening and closing, finger taps, heel taps and toe taps.  Gait and Station: The patient pushes off to arise.  She is somewhat wide-based today and just mildly unsteady. I have reviewed and interpreted the following labs independently   Chemistry      Component Value Date/Time   NA 139 08/22/2023 0901  NA 135 05/28/2017 1042   K 4.4 08/22/2023 0901   CL 104 08/22/2023 0901   CO2 27 08/22/2023 0901   BUN 14 08/22/2023 0901   BUN 8 05/28/2017 1042   CREATININE 0.87 08/22/2023 0901      Component Value Date/Time   CALCIUM  9.1 08/22/2023 0901   CALCIUM  9.1 08/10/2009 2138   ALKPHOS 81 12/01/2022 0953   AST 18 08/22/2023 0901   ALT 12 08/22/2023 0901   BILITOT 0.8 08/22/2023 0901      Lab Results  Component Value Date   TSH 4.93 (H) 08/22/2023   Lab Results  Component Value Date   WBC 5.8 08/22/2023   HGB 12.2 08/22/2023   HCT 39.0 08/22/2023   MCV 99.0 08/22/2023   PLT 208 08/22/2023   No results found for: VITAMINB12    Total time spent on today's visit was 34 minutes, including both face-to-face time and nonface-to-face time.  Time included that spent on review of records (prior notes available to me/labs/imaging if pertinent), discussing treatment and goals, answering patient's questions and coordinating care.  Cc:  Ngetich, Dinah C, NP

## 2023-10-29 ENCOUNTER — Ambulatory Visit: Admitting: Physical Therapy

## 2023-10-29 ENCOUNTER — Encounter: Payer: Self-pay | Admitting: Physical Therapy

## 2023-10-29 ENCOUNTER — Encounter: Payer: Self-pay | Admitting: Neurology

## 2023-10-29 ENCOUNTER — Ambulatory Visit (INDEPENDENT_AMBULATORY_CARE_PROVIDER_SITE_OTHER): Admitting: Neurology

## 2023-10-29 VITALS — BP 126/72 | HR 77 | Wt 164.0 lb

## 2023-10-29 DIAGNOSIS — M25551 Pain in right hip: Secondary | ICD-10-CM

## 2023-10-29 DIAGNOSIS — R262 Difficulty in walking, not elsewhere classified: Secondary | ICD-10-CM | POA: Diagnosis not present

## 2023-10-29 DIAGNOSIS — R9402 Abnormal brain scan: Secondary | ICD-10-CM | POA: Diagnosis not present

## 2023-10-29 DIAGNOSIS — G214 Vascular parkinsonism: Secondary | ICD-10-CM | POA: Diagnosis not present

## 2023-10-29 DIAGNOSIS — R2689 Other abnormalities of gait and mobility: Secondary | ICD-10-CM | POA: Diagnosis not present

## 2023-10-29 DIAGNOSIS — M5459 Other low back pain: Secondary | ICD-10-CM

## 2023-10-29 DIAGNOSIS — M6281 Muscle weakness (generalized): Secondary | ICD-10-CM

## 2023-10-29 DIAGNOSIS — R2681 Unsteadiness on feet: Secondary | ICD-10-CM

## 2023-10-29 NOTE — Therapy (Signed)
 OUTPATIENT PHYSICAL THERAPY NEURO TREATMENT   Patient Name: Ashley Lawson MRN: [id number] DOB:[birth year], 89 y.o., female Today's Date: 10/29/2023   PCP: Leonarda Roxan BROCKS, NP REFERRING PROVIDER: Evonnie Asberry RAMAN, DO  END OF SESSION:  PT End of Session - 10/29/23 1401     Visit Number 5    Number of Visits 16    Date for PT Re-Evaluation 12/10/23    Authorization Type [employer]    Authorization Time Period approved 16 PT visits from 10/15/2023-12/10/2023    Authorization - Visit Number 4    Authorization - Number of Visits 16    Progress Note Due on Visit 10    PT Start Time 1401    PT Stop Time 1440    PT Time Calculation (min) 39 min    Equipment Utilized During Treatment Gait belt    Activity Tolerance Patient tolerated treatment well    Behavior During Therapy WFL for tasks assessed/performed          Past Medical History:  Diagnosis Date   Abdominal pain 12/2009   hospitalized   Allergic rhinitis    Anxiety    Bladder problem    Chronic constipation    Compression fx, lumbar spine (HCC)    DDD (degenerative disc disease), lumbar    Diverticulosis of colon    Dog bite(E906.0) 08/30/2011   GERD (gastroesophageal reflux disease)    Hepatic steatosis    pt denies   Hyperlipidemia    Hyperplastic colon polyp    IBS (irritable bowel syndrome)    constipation predominant   Internal hemorrhoids    Longstanding persistent atrial fibrillation (HCC)    Mitral regurgitation    Osteoporosis    dexa 2011 -2.7 hip nl spine   PMR (polymyalgia rheumatica) (HCC) 08/30/2011   under rheum care  on low dose pred 5 mg    Renal disorder    Syncope    Tricuspid regurgitation    Past Surgical History:  Procedure Laterality Date   ABDOMINAL HYSTERECTOMY     with bladder tact and rectocele repair   BLADDER SURGERY     Streched   CATARACT EXTRACTION  2010   both   COLONOSCOPY  2016 was most recent   CYSTO WITH HYDRODISTENSION N/A 10/05/2019   Procedure:  CYSTOSCOPY/HYDRODISTENSION;  Surgeon: Gaston Hamilton, MD;  Location: [facility];  Service: Urology;  Laterality: N/A;   TONSILLECTOMY AND ADENOIDECTOMY     Patient Active Problem List   Diagnosis Date Noted   External hemorrhoids 03/03/2020   Incomplete bladder emptying 11/29/2019   Acquired thrombophilia (HCC) 11/29/2019   Incomplete emptying of bladder 11/29/2019   Syncope 06/17/2019   Seasonal allergies 06/17/2019   DDD (degenerative disc disease), lumbar    CKD (chronic kidney disease), stage III (HCC)    Internal hemorrhoids    Dizziness 09/17/2018   AF (paroxysmal atrial fibrillation) (HCC) 09/17/2018   Chest discomfort 05/13/2018   Coagulation defect (HCC) 01/30/2018   Deviated septum 01/30/2018   Atrial fibrillation with RVR (HCC) 05/02/2017   Rhinitis, allergic 05/31/2015   Renal cyst incidental  01/19/2015   Heartburn 01/14/2014   GERD (gastroesophageal reflux disease) 01/14/2014   Osteoporosis 01/14/2014   IBS (irritable bowel syndrome)    Arthritis 02/19/2012   Screening, lipid 02/18/2011   Visit for preventive health examination 02/18/2011   Skin cancer 10/14/2010   Constipation, outlet dysfunction 01/15/2010   Lower urinary tract symptoms (LUTS) 01/15/2010   SUPRAPUBIC PAIN 08/01/2009   DIVERTICULOSIS, COLON 06/07/2007  Hyperlipidemia 06/05/2007   Anxiety 06/05/2007   Allergic rhinitis 06/05/2007   IBS 06/05/2007   OSTEOPOROSIS 06/05/2007    ONSET DATE: ~1 year  REFERRING DIAG: G21.4 (ICD-10-CM) - Vascular parkinsonism (HCC)  THERAPY DIAG:  Unsteadiness on feet  Other abnormalities of gait and mobility  Muscle weakness (generalized)  Other low back pain  Pain in right hip  Difficulty in walking, not elsewhere classified  Rationale for Evaluation and Treatment: Rehabilitation  SUBJECTIVE:                                                                                                                                                                                              SUBJECTIVE STATEMENT: Pt states she had a stumble yesterday and hit her hand -- thinks she got her feet tangled underneath her. Fell on the floor and the wall.   From eval: Pt states she saw Dr. Evonnie who recommended PT for her balance. Reports she stumbles around -- feels it has worsened the past year. Doesn't feel safe walking around the circle in front of her home without supervision. Reports she doesn't trust herself going up/down stairs. Daughter states pt always has someone at home with her. Has a sitter on Tuesdays and Thursdays and daughter is there the rest of the days. Daughter notes that pt likes to shop but now only able to just go to one store before she fatigues.  Pt accompanied by: family member, daughter  PERTINENT HISTORY: dementia, A-fib, chronic kidney disease  PAIN:  Are you having pain? No  PRECAUTIONS: Fall  WEIGHT BEARING RESTRICTIONS: No  FALLS: Has patient fallen in last 6 months? No  LIVING ENVIRONMENT: Lives with: lives with their daughter Lives in: House/apartment Stairs: No Has following equipment at home: Single point cane, Shower bench, Grab bars, and standard walker  PATIENT GOALS: Reduce stumbling while walking  OBJECTIVE:   TODAY'S TREATMENT: 10/29/23 Activity Comments  Nustep L6 x 8 min UEs/LE  Cueing to maintain amplitude   Side step with red TB x3 laps Counter support; cues for bigger steps and to keep from shuffling feet  Fwd/bwd monster walk red TB x3 laps  1 UE support; cues to avoid circumduction, step long and tall enough   Staggered stance heel/toe raises 2x10 1 UE support; cueing for foot placement   Sit to stand with eccentric sit down 2x5x5 Able to come up without UEs, requires UE assist on counter to slowly lower and hover 1-2 above chair for 5 sec before sitting all the way  Walk like you're stepping over mud puddles 2x40' SBA Smaller steps with fatigue  Walk  like you're skiing 65x40' CGA Difficulty  coordinating reciprocal/alternating UE with LE movements  Walk like you're an astronaut in space x40' CGA Pt unable to coordinate UE and LE with slower marching movements         TODAY'S TREATMENT: 10/27/23 Activity Comments  Nustep L4 x 6 min UEs/LE  Cueing to maintain amplitude   STS with cueing for increased anterior trunk lean 10x  Tendency to stand with LEs crouched and pushing into chair   fwd/back stepping over hurdle 10x each LE 1 UE support; cues to avoid circumduction, step long and tall enough   alt side step over hurdle 10x each  1 UE support; cueing for foot placement   staggered ant/pos wt shift, then added alt UE swing while maintaining 1 UE on TM rail Pt required verbal and manual cueing to incorporate UE swing throughout. Loses amplitude of wt shift when adding in arm swing   heel/toe raises Posterior lean d/t difficulty performing B DF  Walking on heels/toes  Cueing to maintain amplitude with 1 UE support   Gait + ball toss  Cues for continuous, symmetric steps. CGA for safety. Short sitting rest breaks d/t fatigue   backwards walking More difficulty maintaining step length. Tendency for step-to pattern      HOME EXERCISE PROGRAM: Access Code: [email or web address] URL: [email or web address] Date: 10/22/2023 Prepared by: Damond Borchers April Earnie Starring  Exercises - Heel Raises with Counter Support  - 1 x daily - 7 x weekly - 2 sets - 10 reps - Backward Walking with Counter Support  - 1 x daily - 7 x weekly - 5 sets - Side Stepping with Counter Support  - 1 x daily - 7 x weekly - 5 sets - Sit to Stand with Arm Reach Toward Target  - 1 x daily - 7 x weekly - 2 sets - 5 reps     Note: Objective measures were completed at Evaluation unless otherwise noted.  DIAGNOSTIC FINDINGS: 10/08/23 Brain MRI IMPRESSION: 1.  No evidence of an acute intracranial abnormality. 2. Prominence of the lateral and third ventricles which is at least partly related to moderate generalized  cerebral atrophy. However, an acute callosal angle is also noted and there is mild crowding of the sulci at the level of the high frontoparietal lobes, and a component of normal pressure hydrocephalus (NPH) cannot be excluded. 3. Moderate cerebral white matter chronic small vessel ischemic disease.  COGNITION: Overall cognitive status: worsening memory  POSTURE: rounded shoulders, forward head, and increased thoracic kyphosis  LOWER EXTREMITY ROM:   Not tested  LOWER EXTREMITY MMT:    MMT Right Eval Left Eval  Hip flexion 4- 4-  Hip extension 4 4  Hip abduction 3+ 3+  Hip adduction 4 4  Hip internal rotation    Hip external rotation    Knee flexion 4- 4-  Knee extension 5 5  Ankle dorsiflexion    Ankle plantarflexion 3+ 3+  Ankle inversion    Ankle eversion    (Blank rows = not tested)  BED MOBILITY:  Independent  TRANSFERS: Independent with some use of UEs for chair transfers  GAIT: Findings: Gait Characteristics: step through pattern, decreased step length- Right, decreased step length- Left, Right foot flat, Left foot flat, and shuffling, Distance walked: Into clinic, Level of assistance: SBA, and Comments: Gait speed: 0.66 m/s   FUNCTIONAL TESTS:  5 times sit to stand: 19.79 sec with hands on knees, decreased eccentric control with sitting down, required 2 attempts to  stand on 3rd sit<>stand [facility] PT Assessment - 10/15/23 0001       Standardized Balance Assessment   Standardized Balance Assessment Mini-BESTest;Timed Up and Go Test      Mini-BESTest   Sit To Stand Normal: Comes to stand without use of hands and stabilizes independently.    Rise to Toes Moderate: Heels up, but not full range (smaller than when holding hands), OR noticeable instability for 3 s.    Stand on one leg (left) Moderate: < 20 s    Stand on one leg (right) Moderate: < 20 s    Stand on one leg - lowest score 1    Compensatory Stepping Correction - Forward Normal: Recovers independently  with a single, large step (second realignement is allowed).    Compensatory Stepping Correction - Backward No step, OR would fall if not caught, OR falls spontaneously.    Compensatory Stepping Correction - Left Lateral Moderate: Several steps to recover equilibrium    Compensatory Stepping Correction - Right Lateral Moderate: Several steps to recover equilibrium    Stepping Corredtion Lateral - lowest score 1    Stance - Feet together, eyes open, firm surface  Normal: 30s    Stance - Feet together, eyes closed, foam surface  Severe: Unable    Incline - Eyes Closed Moderate: Stands independently < 30s OR aligns with surface    Change in Gait Speed Moderate: Unable to change walking speed or signs of imbalance    Walk with head turns - Horizontal Moderate: performs head turns with reduction in gait speed.    Walk with pivot turns Moderate:Turns with feet close SLOW (>4 steps) with good balance.    Step over obstacles Moderate: Steps over box but touches box OR displays cautious behavior by slowing gait.    Timed UP & GO with Dual Task Severe: Stops counting while walking OR stops walking while counting.    Mini-BEST total score 14      Timed Up and Go Test   Normal TUG (seconds) 18.41    Cognitive TUG (seconds) 42.97       PATIENT SURVEYS:  Did not assess    PATIENT EDUCATION: Education details: HEP Person educated: Patient and Child(ren) Education method: Medical illustrator Education comprehension: verbalized understanding and needs further education   GOALS: Goals reviewed with patient? Yes  SHORT TERM GOALS: Target date: 11/12/2023   Pt will be able to perform HEP with SBA from daughter/sitter Baseline: Goal status: IN PROGRESS  2.  Pt will have improved controlled 5x STS while sitting down Baseline: Plops into chair, required multiple attempts to stand on 3rd sit to stand Goal status: IN PROGRESS  3.  Pt will have improved TUG to </=15 sec to work  towards decreasing fall risk Baseline: 18.41 sec Goal status: IN PROGRESS  LONG TERM GOALS: Target date: 12/10/2023   Pt will be consistent with her HEP with family/sitter SBA Baseline:  Goal status: IN PROGRESS  2.  Pt will have improved 5x STS to </=13 sec for decreased fall risk and increased functional LE strength Baseline:  Goal status: IN PROGRESS  3.  Pt will have improved MiniBesTEST to >/=19/28 to be considered a reduced risk as a recurrent faller Baseline: 14 Goal status: IN PROGRESS  4.  Pt will have improved TUG to </=13.5 sec for reduced fall risk Baseline:  Goal status: IN PROGRESS  5.  Pt will be able to improve gait speed to >/=0.8 m/s to be considered a  community ambulator for shopping Baseline: 0.66 m/s Goal status: IN PROGRESS   ASSESSMENT:  CLINICAL IMPRESSION: Continued to work on gross bilat LE strengthening. Worked on eccentric control for sit to stand today -- able to hover and maintain good control until ~4 above seat height. Requires cueing to come down a little lower to hover. Worked on improving large motions with gait and bigger weight shifting/coordinating arm movements.   From eval: Patient is a 89 y.o. F who was seen today for physical therapy evaluation and treatment for vascular Parkinson's. PMH is significant for decreased memory, and a-fib. Assessment demos generalized LE weakness, decreased activity tolerance, and high fall risk based on TUG, gait speed and miniBesTEST. Pt will benefit from PT to improve on these issues for home and community safety.   OBJECTIVE IMPAIRMENTS: Abnormal gait, decreased activity tolerance, decreased balance, decreased endurance, decreased mobility, difficulty walking, decreased ROM, decreased strength, improper body mechanics, and postural dysfunction.   ACTIVITY LIMITATIONS: standing, squatting, stairs, transfers, bed mobility, and locomotion level  PARTICIPATION LIMITATIONS: meal prep, cleaning, laundry,  shopping, and community activity  PERSONAL FACTORS: Age, Fitness, Past/current experiences, and Time since onset of injury/illness/exacerbation are also affecting patient's functional outcome.   REHAB POTENTIAL: Good  CLINICAL DECISION MAKING: Evolving/moderate complexity  EVALUATION COMPLEXITY: Moderate  PLAN:  PT FREQUENCY: 2x/week  PT DURATION: 8 weeks  PLANNED INTERVENTIONS: 97164- PT Re-evaluation, 97750- Physical Performance Testing, 97110-Therapeutic exercises, 97530- Therapeutic activity, [id number]- Neuromuscular re-education, 97535- Self Care, [id number]- Manual therapy, [id number]- Gait training, [phone]- Aquatic Therapy, [phone]- Electrical stimulation (unattended), Patient/Family education, Balance training, Stair training, Taping, Vestibular training, Cryotherapy, and Moist heat  PLAN FOR NEXT SESSION: Continue strength and balance HEP. Work on dual tasking with cognitive tasks. Work on foam and EC. Gait training   Danniella Robben [address] [place], [place], DPT 10/29/23 2:03 PM  [facility] at [facility] [address] [place], [address] [place], [place] [address] Phone # [phone] Fax # [phone]

## 2023-10-29 NOTE — Patient Instructions (Signed)
 We discussed the lumbar puncture (spinal tap) but you decided to hold off on that, which we all agree with.  We also discussed neurocognitive testing, but you wanted to hold for now which is okay.  The physicians and staff at [facility] are committed to providing excellent care. You may receive a survey requesting feedback about your experience at our office. We strive to receive very good responses to the survey questions. If you feel that your experience would prevent you from giving the office a very good  response, please contact our office to try to remedy the situation. We may be reached at [phone]. Thank you for taking the time out of your busy day to complete the survey.

## 2023-11-03 ENCOUNTER — Ambulatory Visit: Admitting: Physical Therapy

## 2023-11-05 ENCOUNTER — Ambulatory Visit: Attending: Neurology | Admitting: Physical Therapy

## 2023-11-05 DIAGNOSIS — M6281 Muscle weakness (generalized): Secondary | ICD-10-CM | POA: Insufficient documentation

## 2023-11-05 DIAGNOSIS — R262 Difficulty in walking, not elsewhere classified: Secondary | ICD-10-CM | POA: Diagnosis not present

## 2023-11-05 DIAGNOSIS — R2689 Other abnormalities of gait and mobility: Secondary | ICD-10-CM | POA: Insufficient documentation

## 2023-11-05 DIAGNOSIS — R2681 Unsteadiness on feet: Secondary | ICD-10-CM | POA: Diagnosis not present

## 2023-11-05 NOTE — Therapy (Signed)
 OUTPATIENT PHYSICAL THERAPY NEURO TREATMENT   Patient Name: Ashley Lawson MRN: [id number] DOB:09/09/1932, 89 y.o., female Today's Date: 11/05/2023   PCP: Leonarda Roxan BROCKS, NP REFERRING PROVIDER: Evonnie Asberry RAMAN, DO  END OF SESSION:  PT End of Session - 11/05/23 1347     Visit Number 6    Number of Visits 16    Date for PT Re-Evaluation 12/10/23    Authorization Type [employer]    Authorization Time Period approved 16 PT visits from 10/15/2023-12/10/2023    Authorization - Visit Number 5    Authorization - Number of Visits 16    Progress Note Due on Visit 10    PT Start Time 1355    Equipment Utilized During Treatment Gait belt    Activity Tolerance Patient tolerated treatment well    Behavior During Therapy [facility] for tasks assessed/performed           Past Medical History:  Diagnosis Date   Abdominal pain 12/2009   hospitalized   Allergic rhinitis    Anxiety    Bladder problem    Chronic constipation    Compression fx, lumbar spine (HCC)    DDD (degenerative disc disease), lumbar    Diverticulosis of colon    Dog bite(E906.0) 08/30/2011   GERD (gastroesophageal reflux disease)    Hepatic steatosis    pt denies   Hyperlipidemia    Hyperplastic colon polyp    IBS (irritable bowel syndrome)    constipation predominant   Internal hemorrhoids    Longstanding persistent atrial fibrillation (HCC)    Mitral regurgitation    Osteoporosis    dexa 2011 -2.7 hip nl spine   PMR (polymyalgia rheumatica) (HCC) 08/30/2011   under rheum care  on low dose pred 5 mg    Renal disorder    Syncope    Tricuspid regurgitation    Past Surgical History:  Procedure Laterality Date   ABDOMINAL HYSTERECTOMY     with bladder tact and rectocele repair   BLADDER SURGERY     Streched   CATARACT EXTRACTION  2010   both   COLONOSCOPY  2016 was most recent   CYSTO WITH HYDRODISTENSION N/A 10/05/2019   Procedure: CYSTOSCOPY/HYDRODISTENSION;  Surgeon: Gaston Hamilton, MD;  Location:  [facility];  Service: Urology;  Laterality: N/A;   TONSILLECTOMY AND ADENOIDECTOMY     Patient Active Problem List   Diagnosis Date Noted   External hemorrhoids 03/03/2020   Incomplete bladder emptying 11/29/2019   Acquired thrombophilia (HCC) 11/29/2019   Incomplete emptying of bladder 11/29/2019   Syncope 06/17/2019   Seasonal allergies 06/17/2019   DDD (degenerative disc disease), lumbar    CKD (chronic kidney disease), stage III (HCC)    Internal hemorrhoids    Dizziness 09/17/2018   AF (paroxysmal atrial fibrillation) (HCC) 09/17/2018   Chest discomfort 05/13/2018   Coagulation defect (HCC) 01/30/2018   Deviated septum 01/30/2018   Atrial fibrillation with RVR (HCC) 05/02/2017   Rhinitis, allergic 05/31/2015   Renal cyst incidental  01/19/2015   Heartburn 01/14/2014   GERD (gastroesophageal reflux disease) 01/14/2014   Osteoporosis 01/14/2014   IBS (irritable bowel syndrome)    Arthritis 02/19/2012   Screening, lipid 02/18/2011   Visit for preventive health examination 02/18/2011   Skin cancer 10/14/2010   Constipation, outlet dysfunction 01/15/2010   Lower urinary tract symptoms (LUTS) 01/15/2010   SUPRAPUBIC PAIN 08/01/2009   DIVERTICULOSIS, COLON 06/07/2007   Hyperlipidemia 06/05/2007   Anxiety 06/05/2007   Allergic rhinitis 06/05/2007  IBS 06/05/2007   OSTEOPOROSIS 06/05/2007    ONSET DATE: ~1 year  REFERRING DIAG: G21.4 (ICD-10-CM) - Vascular parkinsonism (HCC)  THERAPY DIAG:  Unsteadiness on feet  Other abnormalities of gait and mobility  Rationale for Evaluation and Treatment: Rehabilitation  SUBJECTIVE:                                                                                                                                                                                             SUBJECTIVE STATEMENT: No more falls, per pt report.  Just feeling puny the last couple of days.  From eval: Pt states she saw Dr. Evonnie who recommended PT for her  balance. Reports she stumbles around -- feels it has worsened the past year. Doesn't feel safe walking around the circle in front of her home without supervision. Reports she doesn't trust herself going up/down stairs. Daughter states pt always has someone at home with her. Has a sitter on Tuesdays and Thursdays and daughter is there the rest of the days. Daughter notes that pt likes to shop but now only able to just go to one store before she fatigues.  Pt accompanied by: family member, daughter  PERTINENT HISTORY: dementia, A-fib, chronic kidney disease  PAIN:  Are you having pain? No  PRECAUTIONS: Fall  WEIGHT BEARING RESTRICTIONS: No  FALLS: Has patient fallen in last 6 months? No  LIVING ENVIRONMENT: Lives with: lives with their daughter Lives in: House/apartment Stairs: No Has following equipment at home: Single point cane, Shower bench, Grab bars, and standard walker  PATIENT GOALS: Reduce stumbling while walking  OBJECTIVE:    TODAY'S TREATMENT: 11/05/2023 Activity Comments  NuStep, Level 4-5, 4 extremities x 8 minutes Cues to keep SPM >70 for increased intensity  Sit<>stand 2 x 5 reps Cues for slowed eccentric control to sit  Sit to stand 5 reps with marching in place Cues to fully back up and slowly sit  Forward walking along counter, 8 reps Cues to take as few steps as possible  Forward/back walking along counter Cues for equal/even step length backwards  Sidestepping, resisted with red band, 3 reps Cues for increased foot clearance  Heel toe raises x 10 Stagger stance rocking for ankle dorsiflexion/plantarflexion   On Airex:   Feet apart with EC, 3 x 30 sec EO head turns/nods 2 x 5 reps Side step taps to floor, 5 reps  Mod sway Light UE support    Gait 50 ft x 4 reps with cues for arm swing Difficulty coordinating arm swing and reverts to shuffle gait      HOME EXERCISE PROGRAM: Access Code: [email or web address]  URL: [email or web address] Date:  10/22/2023 Prepared by: Gellen April Earnie Starring  Exercises - Heel Raises with Counter Support  - 1 x daily - 7 x weekly - 2 sets - 10 reps - Backward Walking with Counter Support  - 1 x daily - 7 x weekly - 5 sets - Side Stepping with Counter Support  - 1 x daily - 7 x weekly - 5 sets - Sit to Stand with Arm Reach Toward Target  - 1 x daily - 7 x weekly - 2 sets - 5 reps     Note: Objective measures were completed at Evaluation unless otherwise noted.  DIAGNOSTIC FINDINGS: 10/08/23 Brain MRI IMPRESSION: 1.  No evidence of an acute intracranial abnormality. 2. Prominence of the lateral and third ventricles which is at least partly related to moderate generalized cerebral atrophy. However, an acute callosal angle is also noted and there is mild crowding of the sulci at the level of the high frontoparietal lobes, and a component of normal pressure hydrocephalus (NPH) cannot be excluded. 3. Moderate cerebral white matter chronic small vessel ischemic disease.  COGNITION: Overall cognitive status: worsening memory  POSTURE: rounded shoulders, forward head, and increased thoracic kyphosis  LOWER EXTREMITY ROM:   Not tested  LOWER EXTREMITY MMT:    MMT Right Eval Left Eval  Hip flexion 4- 4-  Hip extension 4 4  Hip abduction 3+ 3+  Hip adduction 4 4  Hip internal rotation    Hip external rotation    Knee flexion 4- 4-  Knee extension 5 5  Ankle dorsiflexion    Ankle plantarflexion 3+ 3+  Ankle inversion    Ankle eversion    (Blank rows = not tested)  BED MOBILITY:  Independent  TRANSFERS: Independent with some use of UEs for chair transfers  GAIT: Findings: Gait Characteristics: step through pattern, decreased step length- Right, decreased step length- Left, Right foot flat, Left foot flat, and shuffling, Distance walked: Into clinic, Level of assistance: SBA, and Comments: Gait speed: 0.66 m/s   FUNCTIONAL TESTS:  5 times sit to stand: 19.79 sec with hands on  knees, decreased eccentric control with sitting down, required 2 attempts to stand on 3rd sit<>stand [facility] PT Assessment - 10/15/23 0001       Standardized Balance Assessment   Standardized Balance Assessment Mini-BESTest;Timed Up and Go Test      Mini-BESTest   Sit To Stand Normal: Comes to stand without use of hands and stabilizes independently.    Rise to Toes Moderate: Heels up, but not full range (smaller than when holding hands), OR noticeable instability for 3 s.    Stand on one leg (left) Moderate: < 20 s    Stand on one leg (right) Moderate: < 20 s    Stand on one leg - lowest score 1    Compensatory Stepping Correction - Forward Normal: Recovers independently with a single, large step (second realignement is allowed).    Compensatory Stepping Correction - Backward No step, OR would fall if not caught, OR falls spontaneously.    Compensatory Stepping Correction - Left Lateral Moderate: Several steps to recover equilibrium    Compensatory Stepping Correction - Right Lateral Moderate: Several steps to recover equilibrium    Stepping Corredtion Lateral - lowest score 1    Stance - Feet together, eyes open, firm surface  Normal: 30s    Stance - Feet together, eyes closed, foam surface  Severe: Unable    Incline - Eyes Closed Moderate: Stands  independently < 30s OR aligns with surface    Change in Gait Speed Moderate: Unable to change walking speed or signs of imbalance    Walk with head turns - Horizontal Moderate: performs head turns with reduction in gait speed.    Walk with pivot turns Moderate:Turns with feet close SLOW (>4 steps) with good balance.    Step over obstacles Moderate: Steps over box but touches box OR displays cautious behavior by slowing gait.    Timed UP & GO with Dual Task Severe: Stops counting while walking OR stops walking while counting.    Mini-BEST total score 14      Timed Up and Go Test   Normal TUG (seconds) 18.41    Cognitive TUG (seconds) 42.97        PATIENT SURVEYS:  Did not assess    PATIENT EDUCATION: Education details: HEP Person educated: Patient and Child(ren) Education method: Medical illustrator Education comprehension: verbalized understanding and needs further education   GOALS: Goals reviewed with patient? Yes  SHORT TERM GOALS: Target date: 11/12/2023   Pt will be able to perform HEP with SBA from daughter/sitter Baseline: Goal status: IN PROGRESS  2.  Pt will have improved controlled 5x STS while sitting down Baseline: Plops into chair, required multiple attempts to stand on 3rd sit to stand Goal status: IN PROGRESS  3.  Pt will have improved TUG to </=15 sec to work towards decreasing fall risk Baseline: 18.41 sec Goal status: IN PROGRESS  LONG TERM GOALS: Target date: 12/10/2023   Pt will be consistent with her HEP with family/sitter SBA Baseline:  Goal status: IN PROGRESS  2.  Pt will have improved 5x STS to </=13 sec for decreased fall risk and increased functional LE strength Baseline:  Goal status: IN PROGRESS  3.  Pt will have improved MiniBesTEST to >/=19/28 to be considered a reduced risk as a recurrent faller Baseline: 14 Goal status: IN PROGRESS  4.  Pt will have improved TUG to </=13.5 sec for reduced fall risk Baseline:  Goal status: IN PROGRESS  5.  Pt will be able to improve gait speed to >/=0.8 m/s to be considered a Tourist information centre manager for shopping Baseline: 0.66 m/s Goal status: IN PROGRESS   ASSESSMENT:  CLINICAL IMPRESSION: Pt presents today with no new complaints, no new falls.  Continued to work on eccentric control with sit to stand and dynamic balance.  With cues and  attention to task, pt is able to demo improved step length and foot clearance, but with distraction or with cues removed, she reverts to decreased foot clearance and more shuffling gait.  She will continue to benefit from skilled PT towards goals for improved functional mobility and  decreased fall risk.   From eval: Patient is a 89 y.o. F who was seen today for physical therapy evaluation and treatment for vascular Parkinson's. PMH is significant for decreased memory, and a-fib. Assessment demos generalized LE weakness, decreased activity tolerance, and high fall risk based on TUG, gait speed and miniBesTEST. Pt will benefit from PT to improve on these issues for home and community safety.   OBJECTIVE IMPAIRMENTS: Abnormal gait, decreased activity tolerance, decreased balance, decreased endurance, decreased mobility, difficulty walking, decreased ROM, decreased strength, improper body mechanics, and postural dysfunction.   ACTIVITY LIMITATIONS: standing, squatting, stairs, transfers, bed mobility, and locomotion level  PARTICIPATION LIMITATIONS: meal prep, cleaning, laundry, shopping, and community activity  PERSONAL FACTORS: Age, Fitness, Past/current experiences, and Time since onset of injury/illness/exacerbation are  also affecting patient's functional outcome.   REHAB POTENTIAL: Good  CLINICAL DECISION MAKING: Evolving/moderate complexity  EVALUATION COMPLEXITY: Moderate  PLAN:  PT FREQUENCY: 2x/week  PT DURATION: 8 weeks  PLANNED INTERVENTIONS: 97164- PT Re-evaluation, 97750- Physical Performance Testing, 97110-Therapeutic exercises, 97530- Therapeutic activity, [id number]- Neuromuscular re-education, 97535- Self Care, [id number]- Manual therapy, [id number]- Gait training, [phone]- Aquatic Therapy, [phone]- Electrical stimulation (unattended), Patient/Family education, Balance training, Stair training, Taping, Vestibular training, Cryotherapy, and Moist heat  PLAN FOR NEXT SESSION: Continue strength and balance HEP. Work on dual tasking with cognitive tasks. Work on foam and EC. Gait training   Greig Anon, PT 11/05/23 1:48 PM Phone: [phone] Fax: [phone]  [facility] at [facility] [address] [place], [place]  [address] Phone # [phone] # [phone]

## 2023-11-08 ENCOUNTER — Other Ambulatory Visit: Payer: Self-pay | Admitting: Family

## 2023-11-10 ENCOUNTER — Ambulatory Visit

## 2023-11-10 DIAGNOSIS — R2681 Unsteadiness on feet: Secondary | ICD-10-CM

## 2023-11-10 DIAGNOSIS — M6281 Muscle weakness (generalized): Secondary | ICD-10-CM | POA: Diagnosis not present

## 2023-11-10 DIAGNOSIS — R262 Difficulty in walking, not elsewhere classified: Secondary | ICD-10-CM | POA: Diagnosis not present

## 2023-11-10 DIAGNOSIS — R2689 Other abnormalities of gait and mobility: Secondary | ICD-10-CM

## 2023-11-10 NOTE — Therapy (Signed)
 OUTPATIENT PHYSICAL THERAPY NEURO TREATMENT   Patient Name: Ashley Lawson MRN: [id number] DOB:[birth year], 89 y.o., female Today's Date: 11/10/2023   PCP: Leonarda Roxan BROCKS, NP REFERRING PROVIDER: Evonnie Asberry RAMAN, DO  END OF SESSION:  PT End of Session - 11/10/23 1409     Visit Number 7    Number of Visits 16    Date for PT Re-Evaluation 12/10/23    Authorization Type [employer]    Authorization Time Period approved 16 PT visits from 10/15/2023-12/10/2023    Authorization - Visit Number 6    Authorization - Number of Visits 16    Progress Note Due on Visit 10    PT Start Time 1400    PT Stop Time 1445    PT Time Calculation (min) 45 min    Equipment Utilized During Treatment Gait belt    Activity Tolerance Patient tolerated treatment well    Behavior During Therapy WFL for tasks assessed/performed            Past Medical History:  Diagnosis Date   Abdominal pain 12/2009   hospitalized   Allergic rhinitis    Anxiety    Bladder problem    Chronic constipation    Compression fx, lumbar spine (HCC)    DDD (degenerative disc disease), lumbar    Diverticulosis of colon    Dog bite(E906.0) 08/30/2011   GERD (gastroesophageal reflux disease)    Hepatic steatosis    pt denies   Hyperlipidemia    Hyperplastic colon polyp    IBS (irritable bowel syndrome)    constipation predominant   Internal hemorrhoids    Longstanding persistent atrial fibrillation (HCC)    Mitral regurgitation    Osteoporosis    dexa 2011 -2.7 hip nl spine   PMR (polymyalgia rheumatica) (HCC) 08/30/2011   under rheum care  on low dose pred 5 mg    Renal disorder    Syncope    Tricuspid regurgitation    Past Surgical History:  Procedure Laterality Date   ABDOMINAL HYSTERECTOMY     with bladder tact and rectocele repair   BLADDER SURGERY     Streched   CATARACT EXTRACTION  2010   both   COLONOSCOPY  2016 was most recent   CYSTO WITH HYDRODISTENSION N/A 10/05/2019   Procedure:  CYSTOSCOPY/HYDRODISTENSION;  Surgeon: Gaston Hamilton, MD;  Location: [facility];  Service: Urology;  Laterality: N/A;   TONSILLECTOMY AND ADENOIDECTOMY     Patient Active Problem List   Diagnosis Date Noted   External hemorrhoids 03/03/2020   Incomplete bladder emptying 11/29/2019   Acquired thrombophilia (HCC) 11/29/2019   Incomplete emptying of bladder 11/29/2019   Syncope 06/17/2019   Seasonal allergies 06/17/2019   DDD (degenerative disc disease), lumbar    CKD (chronic kidney disease), stage III (HCC)    Internal hemorrhoids    Dizziness 09/17/2018   AF (paroxysmal atrial fibrillation) (HCC) 09/17/2018   Chest discomfort 05/13/2018   Coagulation defect (HCC) 01/30/2018   Deviated septum 01/30/2018   Atrial fibrillation with RVR (HCC) 05/02/2017   Rhinitis, allergic 05/31/2015   Renal cyst incidental  01/19/2015   Heartburn 01/14/2014   GERD (gastroesophageal reflux disease) 01/14/2014   Osteoporosis 01/14/2014   IBS (irritable bowel syndrome)    Arthritis 02/19/2012   Screening, lipid 02/18/2011   Visit for preventive health examination 02/18/2011   Skin cancer 10/14/2010   Constipation, outlet dysfunction 01/15/2010   Lower urinary tract symptoms (LUTS) 01/15/2010   SUPRAPUBIC PAIN 08/01/2009   DIVERTICULOSIS,  COLON 06/07/2007   Hyperlipidemia 06/05/2007   Anxiety 06/05/2007   Allergic rhinitis 06/05/2007   IBS 06/05/2007   OSTEOPOROSIS 06/05/2007    ONSET DATE: ~1 year  REFERRING DIAG: G21.4 (ICD-10-CM) - Vascular parkinsonism (HCC)  THERAPY DIAG:  No diagnosis found.  Rationale for Evaluation and Treatment: Rehabilitation  SUBJECTIVE:                                                                                                                                                                                             SUBJECTIVE STATEMENT: Moving a little slow today, nothing painful.  From eval: Pt states she saw Dr. Evonnie who recommended PT for her  balance. Reports she stumbles around -- feels it has worsened the past year. Doesn't feel safe walking around the circle in front of her home without supervision. Reports she doesn't trust herself going up/down stairs. Daughter states pt always has someone at home with her. Has a sitter on Tuesdays and Thursdays and daughter is there the rest of the days. Daughter notes that pt likes to shop but now only able to just go to one store before she fatigues.  Pt accompanied by: family member, daughter  PERTINENT HISTORY: dementia, A-fib, chronic kidney disease  PAIN:  Are you having pain? No  PRECAUTIONS: Fall  WEIGHT BEARING RESTRICTIONS: No  FALLS: Has patient fallen in last 6 months? No  LIVING ENVIRONMENT: Lives with: lives with their daughter Lives in: House/apartment Stairs: No Has following equipment at home: Single point cane, Shower bench, Grab bars, and standard walker  PATIENT GOALS: Reduce stumbling while walking  OBJECTIVE:   TODAY'S TREATMENT: 11/10/23 Activity Comments  NU-step speed intervals x 8 min Level 4.  2 min warm-up 30 sec sprint; 30 sec recovery. Verbal cues for sequence. HR at 82 bpm end of trial  LAQ 3x12 3#  Standing march 3x12  3#, difficulty w/ single limb stance  Sidestep 2x60 sec 3# ankle weights  Static multisensory balance          TODAY'S TREATMENT: 11/05/2023 Activity Comments  NuStep, Level 4-5, 4 extremities x 8 minutes Cues to keep SPM >70 for increased intensity  Sit<>stand 2 x 5 reps Cues for slowed eccentric control to sit  Sit to stand 5 reps with marching in place Cues to fully back up and slowly sit  Forward walking along counter, 8 reps Cues to take as few steps as possible  Forward/back walking along counter Cues for equal/even step length backwards  Sidestepping, resisted with red band, 3 reps Cues for increased foot clearance  Heel toe raises x 10 Stagger stance rocking  for ankle dorsiflexion/plantarflexion   On Airex:    Feet apart with EC, 3 x 30 sec EO head turns/nods 2 x 5 reps Side step taps to floor, 5 reps  Mod sway Light UE support    Gait 50 ft x 4 reps with cues for arm swing Difficulty coordinating arm swing and reverts to shuffle gait      HOME EXERCISE PROGRAM: Access Code: [email or web address] URL: [email or web address] Date: 10/22/2023 Prepared by: Gellen April Earnie Starring  Exercises - Heel Raises with Counter Support  - 1 x daily - 7 x weekly - 2 sets - 10 reps - Backward Walking with Counter Support  - 1 x daily - 7 x weekly - 5 sets - Side Stepping with Counter Support  - 1 x daily - 7 x weekly - 5 sets - Sit to Stand with Arm Reach Toward Target  - 1 x daily - 7 x weekly - 2 sets - 5 reps     Note: Objective measures were completed at Evaluation unless otherwise noted.  DIAGNOSTIC FINDINGS: 10/08/23 Brain MRI IMPRESSION: 1.  No evidence of an acute intracranial abnormality. 2. Prominence of the lateral and third ventricles which is at least partly related to moderate generalized cerebral atrophy. However, an acute callosal angle is also noted and there is mild crowding of the sulci at the level of the high frontoparietal lobes, and a component of normal pressure hydrocephalus (NPH) cannot be excluded. 3. Moderate cerebral white matter chronic small vessel ischemic disease.  COGNITION: Overall cognitive status: worsening memory  POSTURE: rounded shoulders, forward head, and increased thoracic kyphosis  LOWER EXTREMITY ROM:   Not tested  LOWER EXTREMITY MMT:    MMT Right Eval Left Eval  Hip flexion 4- 4-  Hip extension 4 4  Hip abduction 3+ 3+  Hip adduction 4 4  Hip internal rotation    Hip external rotation    Knee flexion 4- 4-  Knee extension 5 5  Ankle dorsiflexion    Ankle plantarflexion 3+ 3+  Ankle inversion    Ankle eversion    (Blank rows = not tested)  BED MOBILITY:  Independent  TRANSFERS: Independent with some use of UEs for chair  transfers  GAIT: Findings: Gait Characteristics: step through pattern, decreased step length- Right, decreased step length- Left, Right foot flat, Left foot flat, and shuffling, Distance walked: Into clinic, Level of assistance: SBA, and Comments: Gait speed: 0.66 m/s   FUNCTIONAL TESTS:  5 times sit to stand: 19.79 sec with hands on knees, decreased eccentric control with sitting down, required 2 attempts to stand on 3rd sit<>stand [facility] PT Assessment - 10/15/23 0001       Standardized Balance Assessment   Standardized Balance Assessment Mini-BESTest;Timed Up and Go Test      Mini-BESTest   Sit To Stand Normal: Comes to stand without use of hands and stabilizes independently.    Rise to Toes Moderate: Heels up, but not full range (smaller than when holding hands), OR noticeable instability for 3 s.    Stand on one leg (left) Moderate: < 20 s    Stand on one leg (right) Moderate: < 20 s    Stand on one leg - lowest score 1    Compensatory Stepping Correction - Forward Normal: Recovers independently with a single, large step (second realignement is allowed).    Compensatory Stepping Correction - Backward No step, OR would fall if not caught, OR falls spontaneously.    Compensatory Stepping Correction -  Left Lateral Moderate: Several steps to recover equilibrium    Compensatory Stepping Correction - Right Lateral Moderate: Several steps to recover equilibrium    Stepping Corredtion Lateral - lowest score 1    Stance - Feet together, eyes open, firm surface  Normal: 30s    Stance - Feet together, eyes closed, foam surface  Severe: Unable    Incline - Eyes Closed Moderate: Stands independently < 30s OR aligns with surface    Change in Gait Speed Moderate: Unable to change walking speed or signs of imbalance    Walk with head turns - Horizontal Moderate: performs head turns with reduction in gait speed.    Walk with pivot turns Moderate:Turns with feet close SLOW (>4 steps) with good balance.     Step over obstacles Moderate: Steps over box but touches box OR displays cautious behavior by slowing gait.    Timed UP & GO with Dual Task Severe: Stops counting while walking OR stops walking while counting.    Mini-BEST total score 14      Timed Up and Go Test   Normal TUG (seconds) 18.41    Cognitive TUG (seconds) 42.97       PATIENT SURVEYS:  Did not assess    PATIENT EDUCATION: Education details: HEP Person educated: Patient and Child(ren) Education method: Medical illustrator Education comprehension: verbalized understanding and needs further education   GOALS: Goals reviewed with patient? Yes  SHORT TERM GOALS: Target date: 11/12/2023   Pt will be able to perform HEP with SBA from daughter/sitter Baseline: Goal status: IN PROGRESS  2.  Pt will have improved controlled 5x STS while sitting down Baseline: Plops into chair, required multiple attempts to stand on 3rd sit to stand Goal status: IN PROGRESS  3.  Pt will have improved TUG to </=15 sec to work towards decreasing fall risk Baseline: 18.41 sec Goal status: IN PROGRESS  LONG TERM GOALS: Target date: 12/10/2023   Pt will be consistent with her HEP with family/sitter SBA Baseline:  Goal status: IN PROGRESS  2.  Pt will have improved 5x STS to </=13 sec for decreased fall risk and increased functional LE strength Baseline:  Goal status: IN PROGRESS  3.  Pt will have improved MiniBesTEST to >/=19/28 to be considered a reduced risk as a recurrent faller Baseline: 14 Goal status: IN PROGRESS  4.  Pt will have improved TUG to </=13.5 sec for reduced fall risk Baseline:  Goal status: IN PROGRESS  5.  Pt will be able to improve gait speed to >/=0.8 m/s to be considered a Tourist information centre manager for shopping Baseline: 0.66 m/s Goal status: IN PROGRESS   ASSESSMENT:  CLINICAL IMPRESSION: Initiated w/ NU-step high-intensity intervals for improved activity tolerance and rapid alternating  coordination w/ good maintenance of speed for intervals w/ verbal cues for sequence. Strength training to improve BLE strength for improved activity tolerance. Dynamic balance activities for single limb support and multisensory static balance to facilitate righting reactions and awareness for limits of stability.  Difficulty with compliant surfaces and eyes closed conditions w/ tendency for reto-LOB and present, but delayed righting reactions requiring physical assist to correct. Continued sessions to progress POC details to improve mobility and reduce fall risk.   From eval: Patient is a 89 y.o. F who was seen today for physical therapy evaluation and treatment for vascular Parkinson's. PMH is significant for decreased memory, and a-fib. Assessment demos generalized LE weakness, decreased activity tolerance, and high fall risk based on TUG,  gait speed and miniBesTEST. Pt will benefit from PT to improve on these issues for home and community safety.   OBJECTIVE IMPAIRMENTS: Abnormal gait, decreased activity tolerance, decreased balance, decreased endurance, decreased mobility, difficulty walking, decreased ROM, decreased strength, improper body mechanics, and postural dysfunction.   ACTIVITY LIMITATIONS: standing, squatting, stairs, transfers, bed mobility, and locomotion level  PARTICIPATION LIMITATIONS: meal prep, cleaning, laundry, shopping, and community activity  PERSONAL FACTORS: Age, Fitness, Past/current experiences, and Time since onset of injury/illness/exacerbation are also affecting patient's functional outcome.   REHAB POTENTIAL: Good  CLINICAL DECISION MAKING: Evolving/moderate complexity  EVALUATION COMPLEXITY: Moderate  PLAN:  PT FREQUENCY: 2x/week  PT DURATION: 8 weeks  PLANNED INTERVENTIONS: 97164- PT Re-evaluation, 97750- Physical Performance Testing, 97110-Therapeutic exercises, 97530- Therapeutic activity, [id number]- Neuromuscular re-education, 97535- Self Care, [id number]- Manual  therapy, [id number]- Gait training, [phone]- Aquatic Therapy, [phone]- Electrical stimulation (unattended), Patient/Family education, Balance training, Stair training, Taping, Vestibular training, Cryotherapy, and Moist heat  PLAN FOR NEXT SESSION: Continue strength and balance HEP. Work on dual tasking with cognitive tasks. Work on foam and EC. Gait training   2:56 PM, 11/10/23 M. Kelly Trendon Zaring, PT, DPT Physical Therapist- [place] Office Number: [phone]

## 2023-11-12 ENCOUNTER — Ambulatory Visit

## 2023-11-12 DIAGNOSIS — M6281 Muscle weakness (generalized): Secondary | ICD-10-CM | POA: Diagnosis not present

## 2023-11-12 DIAGNOSIS — R2689 Other abnormalities of gait and mobility: Secondary | ICD-10-CM | POA: Diagnosis not present

## 2023-11-12 DIAGNOSIS — R2681 Unsteadiness on feet: Secondary | ICD-10-CM

## 2023-11-12 DIAGNOSIS — R262 Difficulty in walking, not elsewhere classified: Secondary | ICD-10-CM

## 2023-11-12 NOTE — Therapy (Signed)
 OUTPATIENT PHYSICAL THERAPY NEURO TREATMENT   Patient Name: Ashley Lawson MRN: [id number] DOB:11/15/1932, 89 y.o., female Today's Date: 11/12/2023   PCP: Leonarda Roxan BROCKS, NP REFERRING PROVIDER: Evonnie Asberry RAMAN, DO  END OF SESSION:  PT End of Session - 11/12/23 1533     Visit Number 8    Number of Visits 16    Date for PT Re-Evaluation 12/10/23    Authorization Type [employer]    Authorization Time Period approved 16 PT visits from 10/15/2023-12/10/2023    Authorization - Visit Number 7    Authorization - Number of Visits 16    Progress Note Due on Visit 10    PT Start Time 1530    PT Stop Time 1615    PT Time Calculation (min) 45 min    Equipment Utilized During Treatment Gait belt    Activity Tolerance Patient tolerated treatment well    Behavior During Therapy WFL for tasks assessed/performed            Past Medical History:  Diagnosis Date   Abdominal pain 12/2009   hospitalized   Allergic rhinitis    Anxiety    Bladder problem    Chronic constipation    Compression fx, lumbar spine (HCC)    DDD (degenerative disc disease), lumbar    Diverticulosis of colon    Dog bite(E906.0) 08/30/2011   GERD (gastroesophageal reflux disease)    Hepatic steatosis    pt denies   Hyperlipidemia    Hyperplastic colon polyp    IBS (irritable bowel syndrome)    constipation predominant   Internal hemorrhoids    Longstanding persistent atrial fibrillation (HCC)    Mitral regurgitation    Osteoporosis    dexa 2011 -2.7 hip nl spine   PMR (polymyalgia rheumatica) (HCC) 08/30/2011   under rheum care  on low dose pred 5 mg    Renal disorder    Syncope    Tricuspid regurgitation    Past Surgical History:  Procedure Laterality Date   ABDOMINAL HYSTERECTOMY     with bladder tact and rectocele repair   BLADDER SURGERY     Streched   CATARACT EXTRACTION  2010   both   COLONOSCOPY  2016 was most recent   CYSTO WITH HYDRODISTENSION N/A 10/05/2019   Procedure:  CYSTOSCOPY/HYDRODISTENSION;  Surgeon: Gaston Hamilton, MD;  Location: [facility];  Service: Urology;  Laterality: N/A;   TONSILLECTOMY AND ADENOIDECTOMY     Patient Active Problem List   Diagnosis Date Noted   External hemorrhoids 03/03/2020   Incomplete bladder emptying 11/29/2019   Acquired thrombophilia (HCC) 11/29/2019   Incomplete emptying of bladder 11/29/2019   Syncope 06/17/2019   Seasonal allergies 06/17/2019   DDD (degenerative disc disease), lumbar    CKD (chronic kidney disease), stage III (HCC)    Internal hemorrhoids    Dizziness 09/17/2018   AF (paroxysmal atrial fibrillation) (HCC) 09/17/2018   Chest discomfort 05/13/2018   Coagulation defect (HCC) 01/30/2018   Deviated septum 01/30/2018   Atrial fibrillation with RVR (HCC) 05/02/2017   Rhinitis, allergic 05/31/2015   Renal cyst incidental  01/19/2015   Heartburn 01/14/2014   GERD (gastroesophageal reflux disease) 01/14/2014   Osteoporosis 01/14/2014   IBS (irritable bowel syndrome)    Arthritis 02/19/2012   Screening, lipid 02/18/2011   Visit for preventive health examination 02/18/2011   Skin cancer 10/14/2010   Constipation, outlet dysfunction 01/15/2010   Lower urinary tract symptoms (LUTS) 01/15/2010   SUPRAPUBIC PAIN 08/01/2009   DIVERTICULOSIS,  COLON 06/07/2007   Hyperlipidemia 06/05/2007   Anxiety 06/05/2007   Allergic rhinitis 06/05/2007   IBS 06/05/2007   OSTEOPOROSIS 06/05/2007    ONSET DATE: ~1 year  REFERRING DIAG: G21.4 (ICD-10-CM) - Vascular parkinsonism (HCC)  THERAPY DIAG:  Unsteadiness on feet  Other abnormalities of gait and mobility  Muscle weakness (generalized)  Difficulty in walking, not elsewhere classified  Rationale for Evaluation and Treatment: Rehabilitation  SUBJECTIVE:                                                                                                                                                                                             SUBJECTIVE  STATEMENT: Has had 2 additional falls in recent hx with one fall from bed scraping right arm and fell backwards off of bed and scraped back  From eval: Pt states she saw Dr. Evonnie who recommended PT for her balance. Reports she stumbles around -- feels it has worsened the past year. Doesn't feel safe walking around the circle in front of her home without supervision. Reports she doesn't trust herself going up/down stairs. Daughter states pt always has someone at home with her. Has a sitter on Tuesdays and Thursdays and daughter is there the rest of the days. Daughter notes that pt likes to shop but now only able to just go to one store before she fatigues.  Pt accompanied by: family member, daughter  PERTINENT HISTORY: dementia, A-fib, chronic kidney disease  PAIN:  Are you having pain? No  PRECAUTIONS: Fall  WEIGHT BEARING RESTRICTIONS: No  FALLS: Has patient fallen in last 6 months? No  LIVING ENVIRONMENT: Lives with: lives with their daughter Lives in: House/apartment Stairs: No Has following equipment at home: Single point cane, Shower bench, Grab bars, and standard walker  PATIENT GOALS: Reduce stumbling while walking  OBJECTIVE:   TODAY'S TREATMENT: 11/12/23 Activity Comments  LE PRE seated 3x10 -LAQ , 3# -hip add iso 3x10 -hip abd-manual resist -hamstring curls red loop  Dynamic balance -sidestepping x 2 min, 3# -alt stair taps x 2 min, 3#, 6 box -alt toe taps standing on foam, UE support needed  Static multisensory balance LOB with eyes closed or head movement conditions  Treadmill training 2 min rounds at 1.3 mph cues for trunk extension and stride length -60 sec rest period 2 rounds              HOME EXERCISE PROGRAM: Access Code: [email or web address] URL: [email or web address] Date: 10/22/2023 Prepared by: Gellen April Marie Nonato  Exercises - Heel Raises with Counter Support  - 1 x daily - 7 x weekly - 2 sets - 10  reps - Backward Walking with  Counter Support  - 1 x daily - 7 x weekly - 5 sets - Side Stepping with Counter Support  - 1 x daily - 7 x weekly - 5 sets - Sit to Stand with Arm Reach Toward Target  - 1 x daily - 7 x weekly - 2 sets - 5 reps     Note: Objective measures were completed at Evaluation unless otherwise noted.  DIAGNOSTIC FINDINGS: 10/08/23 Brain MRI IMPRESSION: 1.  No evidence of an acute intracranial abnormality. 2. Prominence of the lateral and third ventricles which is at least partly related to moderate generalized cerebral atrophy. However, an acute callosal angle is also noted and there is mild crowding of the sulci at the level of the high frontoparietal lobes, and a component of normal pressure hydrocephalus (NPH) cannot be excluded. 3. Moderate cerebral white matter chronic small vessel ischemic disease.  COGNITION: Overall cognitive status: worsening memory  POSTURE: rounded shoulders, forward head, and increased thoracic kyphosis  LOWER EXTREMITY ROM:   Not tested  LOWER EXTREMITY MMT:    MMT Right Eval Left Eval  Hip flexion 4- 4-  Hip extension 4 4  Hip abduction 3+ 3+  Hip adduction 4 4  Hip internal rotation    Hip external rotation    Knee flexion 4- 4-  Knee extension 5 5  Ankle dorsiflexion    Ankle plantarflexion 3+ 3+  Ankle inversion    Ankle eversion    (Blank rows = not tested)  BED MOBILITY:  Independent  TRANSFERS: Independent with some use of UEs for chair transfers  GAIT: Findings: Gait Characteristics: step through pattern, decreased step length- Right, decreased step length- Left, Right foot flat, Left foot flat, and shuffling, Distance walked: Into clinic, Level of assistance: SBA, and Comments: Gait speed: 0.66 m/s   FUNCTIONAL TESTS:  5 times sit to stand: 19.79 sec with hands on knees, decreased eccentric control with sitting down, required 2 attempts to stand on 3rd sit<>stand [facility] PT Assessment - 10/15/23 0001       Standardized Balance  Assessment   Standardized Balance Assessment Mini-BESTest;Timed Up and Go Test      Mini-BESTest   Sit To Stand Normal: Comes to stand without use of hands and stabilizes independently.    Rise to Toes Moderate: Heels up, but not full range (smaller than when holding hands), OR noticeable instability for 3 s.    Stand on one leg (left) Moderate: < 20 s    Stand on one leg (right) Moderate: < 20 s    Stand on one leg - lowest score 1    Compensatory Stepping Correction - Forward Normal: Recovers independently with a single, large step (second realignement is allowed).    Compensatory Stepping Correction - Backward No step, OR would fall if not caught, OR falls spontaneously.    Compensatory Stepping Correction - Left Lateral Moderate: Several steps to recover equilibrium    Compensatory Stepping Correction - Right Lateral Moderate: Several steps to recover equilibrium    Stepping Corredtion Lateral - lowest score 1    Stance - Feet together, eyes open, firm surface  Normal: 30s    Stance - Feet together, eyes closed, foam surface  Severe: Unable    Incline - Eyes Closed Moderate: Stands independently < 30s OR aligns with surface    Change in Gait Speed Moderate: Unable to change walking speed or signs of imbalance    Walk with head turns -  Horizontal Moderate: performs head turns with reduction in gait speed.    Walk with pivot turns Moderate:Turns with feet close SLOW (>4 steps) with good balance.    Step over obstacles Moderate: Steps over box but touches box OR displays cautious behavior by slowing gait.    Timed UP & GO with Dual Task Severe: Stops counting while walking OR stops walking while counting.    Mini-BEST total score 14      Timed Up and Go Test   Normal TUG (seconds) 18.41    Cognitive TUG (seconds) 42.97       PATIENT SURVEYS:  Did not assess    PATIENT EDUCATION: Education details: HEP Person educated: Patient and Child(ren) Education method: Software engineer Education comprehension: verbalized understanding and needs further education   GOALS: Goals reviewed with patient? Yes  SHORT TERM GOALS: Target date: 11/12/2023   Pt will be able to perform HEP with SBA from daughter/sitter Baseline: Goal status: IN PROGRESS  2.  Pt will have improved controlled 5x STS while sitting down Baseline: Plops into chair, required multiple attempts to stand on 3rd sit to stand Goal status: IN PROGRESS  3.  Pt will have improved TUG to </=15 sec to work towards decreasing fall risk Baseline: 18.41 sec Goal status: IN PROGRESS  LONG TERM GOALS: Target date: 12/10/2023   Pt will be consistent with her HEP with family/sitter SBA Baseline:  Goal status: IN PROGRESS  2.  Pt will have improved 5x STS to </=13 sec for decreased fall risk and increased functional LE strength Baseline:  Goal status: IN PROGRESS  3.  Pt will have improved MiniBesTEST to >/=19/28 to be considered a reduced risk as a recurrent faller Baseline: 14 Goal status: IN PROGRESS  4.  Pt will have improved TUG to </=13.5 sec for reduced fall risk Baseline:  Goal status: IN PROGRESS  5.  Pt will be able to improve gait speed to >/=0.8 m/s to be considered a Tourist information centre manager for shopping Baseline: 0.66 m/s Goal status: IN PROGRESS   ASSESSMENT:  CLINICAL IMPRESSION: LE strengthening to improve power/activity tolerance progressed to increased weight with good tolerance.  Static and dynamic balance activities to facilitate righting reactions and postural stability w/ difficulty under multisensory conditions w/ tendency for backwards LOB. Treadmill training to increase gait speed and improve endurance performed in 2:1 work:rest format. Continued sessions to progress POC details to improve mobility and reduce risk fro falls.   From eval: Patient is a 89 y.o. F who was seen today for physical therapy evaluation and treatment for vascular Parkinson's. PMH is  significant for decreased memory, and a-fib. Assessment demos generalized LE weakness, decreased activity tolerance, and high fall risk based on TUG, gait speed and miniBesTEST. Pt will benefit from PT to improve on these issues for home and community safety.   OBJECTIVE IMPAIRMENTS: Abnormal gait, decreased activity tolerance, decreased balance, decreased endurance, decreased mobility, difficulty walking, decreased ROM, decreased strength, improper body mechanics, and postural dysfunction.   ACTIVITY LIMITATIONS: standing, squatting, stairs, transfers, bed mobility, and locomotion level  PARTICIPATION LIMITATIONS: meal prep, cleaning, laundry, shopping, and community activity  PERSONAL FACTORS: Age, Fitness, Past/current experiences, and Time since onset of injury/illness/exacerbation are also affecting patient's functional outcome.   REHAB POTENTIAL: Good  CLINICAL DECISION MAKING: Evolving/moderate complexity  EVALUATION COMPLEXITY: Moderate  PLAN:  PT FREQUENCY: 2x/week  PT DURATION: 8 weeks  PLANNED INTERVENTIONS: 97164- PT Re-evaluation, 97750- Physical Performance Testing, 97110-Therapeutic exercises, 97530- Therapeutic activity, 97112-  Neuromuscular re-education, [phone]- Self Care, [id number]- Manual therapy, [phone]- Gait training, [phone]- Aquatic Therapy, [phone]- Electrical stimulation (unattended), Patient/Family education, Balance training, Stair training, Taping, Vestibular training, Cryotherapy, and Moist heat  PLAN FOR NEXT SESSION: Continue strength and balance HEP. Work on dual tasking with cognitive tasks. Work on foam and EC. Gait training   3:33 PM, 11/12/23 M. Kelly Simran Bomkamp, PT, DPT Physical Therapist- [place] Office Number: [phone]

## 2023-11-20 NOTE — Therapy (Incomplete)
 OUTPATIENT PHYSICAL THERAPY NEURO TREATMENT   Patient Name: Ashley Lawson MRN: [id number] DOB:[birth year], 89 y.o., female Today's Date: 11/20/2023   PCP: Leonarda Roxan BROCKS, NP REFERRING PROVIDER: Evonnie Asberry RAMAN, DO  END OF SESSION:      Past Medical History:  Diagnosis Date   Abdominal pain 12/2009   hospitalized   Allergic rhinitis    Anxiety    Bladder problem    Chronic constipation    Compression fx, lumbar spine (HCC)    DDD (degenerative disc disease), lumbar    Diverticulosis of colon    Dog bite(E906.0) 08/30/2011   GERD (gastroesophageal reflux disease)    Hepatic steatosis    pt denies   Hyperlipidemia    Hyperplastic colon polyp    IBS (irritable bowel syndrome)    constipation predominant   Internal hemorrhoids    Longstanding persistent atrial fibrillation (HCC)    Mitral regurgitation    Osteoporosis    dexa 2011 -2.7 hip nl spine   PMR (polymyalgia rheumatica) (HCC) 08/30/2011   under rheum care  on low dose pred 5 mg    Renal disorder    Syncope    Tricuspid regurgitation    Past Surgical History:  Procedure Laterality Date   ABDOMINAL HYSTERECTOMY     with bladder tact and rectocele repair   BLADDER SURGERY     Streched   CATARACT EXTRACTION  2010   both   COLONOSCOPY  2016 was most recent   CYSTO WITH HYDRODISTENSION N/A 10/05/2019   Procedure: CYSTOSCOPY/HYDRODISTENSION;  Surgeon: Gaston Hamilton, MD;  Location: [facility];  Service: Urology;  Laterality: N/A;   TONSILLECTOMY AND ADENOIDECTOMY     Patient Active Problem List   Diagnosis Date Noted   External hemorrhoids 03/03/2020   Incomplete bladder emptying 11/29/2019   Acquired thrombophilia (HCC) 11/29/2019   Incomplete emptying of bladder 11/29/2019   Syncope 06/17/2019   Seasonal allergies 06/17/2019   DDD (degenerative disc disease), lumbar    CKD (chronic kidney disease), stage III (HCC)    Internal hemorrhoids    Dizziness 09/17/2018   AF (paroxysmal atrial fibrillation)  (HCC) 09/17/2018   Chest discomfort 05/13/2018   Coagulation defect (HCC) 01/30/2018   Deviated septum 01/30/2018   Atrial fibrillation with RVR (HCC) 05/02/2017   Rhinitis, allergic 05/31/2015   Renal cyst incidental  01/19/2015   Heartburn 01/14/2014   GERD (gastroesophageal reflux disease) 01/14/2014   Osteoporosis 01/14/2014   IBS (irritable bowel syndrome)    Arthritis 02/19/2012   Screening, lipid 02/18/2011   Visit for preventive health examination 02/18/2011   Skin cancer 10/14/2010   Constipation, outlet dysfunction 01/15/2010   Lower urinary tract symptoms (LUTS) 01/15/2010   SUPRAPUBIC PAIN 08/01/2009   DIVERTICULOSIS, COLON 06/07/2007   Hyperlipidemia 06/05/2007   Anxiety 06/05/2007   Allergic rhinitis 06/05/2007   IBS 06/05/2007   OSTEOPOROSIS 06/05/2007    ONSET DATE: ~1 year  REFERRING DIAG: G21.4 (ICD-10-CM) - Vascular parkinsonism (HCC)  THERAPY DIAG:  No diagnosis found.  Rationale for Evaluation and Treatment: Rehabilitation  SUBJECTIVE:  SUBJECTIVE STATEMENT: Has had 2 additional falls in recent hx with one fall from bed scraping right arm and fell backwards off of bed and scraped back  From eval: Pt states she saw Dr. Evonnie who recommended PT for her balance. Reports she stumbles around -- feels it has worsened the past year. Doesn't feel safe walking around the circle in front of her home without supervision. Reports she doesn't trust herself going up/down stairs. Daughter states pt always has someone at home with her. Has a sitter on Tuesdays and Thursdays and daughter is there the rest of the days. Daughter notes that pt likes to shop but now only able to just go to one store before she fatigues.  Pt accompanied by: family member, daughter  PERTINENT HISTORY: dementia,  A-fib, chronic kidney disease  PAIN:  Are you having pain? No  PRECAUTIONS: Fall  WEIGHT BEARING RESTRICTIONS: No  FALLS: Has patient fallen in last 6 months? No  LIVING ENVIRONMENT: Lives with: lives with their daughter Lives in: House/apartment Stairs: No Has following equipment at home: Single point cane, Shower bench, Grab bars, and standard walker  PATIENT GOALS: Reduce stumbling while walking  OBJECTIVE:     TODAY'S TREATMENT: 11/21/23 Activity Comments                        HOME EXERCISE PROGRAM: Access Code: [email or web address] URL: [email or web address] Date: 10/22/2023 Prepared by: Gellen April Earnie Starring  Exercises - Heel Raises with Counter Support  - 1 x daily - 7 x weekly - 2 sets - 10 reps - Backward Walking with Counter Support  - 1 x daily - 7 x weekly - 5 sets - Side Stepping with Counter Support  - 1 x daily - 7 x weekly - 5 sets - Sit to Stand with Arm Reach Toward Target  - 1 x daily - 7 x weekly - 2 sets - 5 reps     Note: Objective measures were completed at Evaluation unless otherwise noted.  DIAGNOSTIC FINDINGS: 10/08/23 Brain MRI IMPRESSION: 1.  No evidence of an acute intracranial abnormality. 2. Prominence of the lateral and third ventricles which is at least partly related to moderate generalized cerebral atrophy. However, an acute callosal angle is also noted and there is mild crowding of the sulci at the level of the high frontoparietal lobes, and a component of normal pressure hydrocephalus (NPH) cannot be excluded. 3. Moderate cerebral white matter chronic small vessel ischemic disease.  COGNITION: Overall cognitive status: worsening memory  POSTURE: rounded shoulders, forward head, and increased thoracic kyphosis  LOWER EXTREMITY ROM:   Not tested  LOWER EXTREMITY MMT:    MMT Right Eval Left Eval  Hip flexion 4- 4-  Hip extension 4 4  Hip abduction 3+ 3+  Hip adduction 4 4  Hip internal rotation    Hip  external rotation    Knee flexion 4- 4-  Knee extension 5 5  Ankle dorsiflexion    Ankle plantarflexion 3+ 3+  Ankle inversion    Ankle eversion    (Blank rows = not tested)  BED MOBILITY:  Independent  TRANSFERS: Independent with some use of UEs for chair transfers  GAIT: Findings: Gait Characteristics: step through pattern, decreased step length- Right, decreased step length- Left, Right foot flat, Left foot flat, and shuffling, Distance walked: Into clinic, Level of assistance: SBA, and Comments: Gait speed: 0.66 m/s   FUNCTIONAL TESTS:  5 times sit to stand: 19.79 sec with  hands on knees, decreased eccentric control with sitting down, required 2 attempts to stand on 3rd sit<>stand [facility] PT Assessment - 10/15/23 0001       Standardized Balance Assessment   Standardized Balance Assessment Mini-BESTest;Timed Up and Go Test      Mini-BESTest   Sit To Stand Normal: Comes to stand without use of hands and stabilizes independently.    Rise to Toes Moderate: Heels up, but not full range (smaller than when holding hands), OR noticeable instability for 3 s.    Stand on one leg (left) Moderate: < 20 s    Stand on one leg (right) Moderate: < 20 s    Stand on one leg - lowest score 1    Compensatory Stepping Correction - Forward Normal: Recovers independently with a single, large step (second realignement is allowed).    Compensatory Stepping Correction - Backward No step, OR would fall if not caught, OR falls spontaneously.    Compensatory Stepping Correction - Left Lateral Moderate: Several steps to recover equilibrium    Compensatory Stepping Correction - Right Lateral Moderate: Several steps to recover equilibrium    Stepping Corredtion Lateral - lowest score 1    Stance - Feet together, eyes open, firm surface  Normal: 30s    Stance - Feet together, eyes closed, foam surface  Severe: Unable    Incline - Eyes Closed Moderate: Stands independently < 30s OR aligns with surface     Change in Gait Speed Moderate: Unable to change walking speed or signs of imbalance    Walk with head turns - Horizontal Moderate: performs head turns with reduction in gait speed.    Walk with pivot turns Moderate:Turns with feet close SLOW (>4 steps) with good balance.    Step over obstacles Moderate: Steps over box but touches box OR displays cautious behavior by slowing gait.    Timed UP & GO with Dual Task Severe: Stops counting while walking OR stops walking while counting.    Mini-BEST total score 14      Timed Up and Go Test   Normal TUG (seconds) 18.41    Cognitive TUG (seconds) 42.97       PATIENT SURVEYS:  Did not assess    PATIENT EDUCATION: Education details: HEP Person educated: Patient and Child(ren) Education method: Medical illustrator Education comprehension: verbalized understanding and needs further education   GOALS: Goals reviewed with patient? Yes  SHORT TERM GOALS: Target date: 11/12/2023   Pt will be able to perform HEP with SBA from daughter/sitter Baseline: Goal status: IN PROGRESS  2.  Pt will have improved controlled 5x STS while sitting down Baseline: Plops into chair, required multiple attempts to stand on 3rd sit to stand Goal status: IN PROGRESS  3.  Pt will have improved TUG to </=15 sec to work towards decreasing fall risk Baseline: 18.41 sec Goal status: IN PROGRESS  LONG TERM GOALS: Target date: 12/10/2023   Pt will be consistent with her HEP with family/sitter SBA Baseline:  Goal status: IN PROGRESS  2.  Pt will have improved 5x STS to </=13 sec for decreased fall risk and increased functional LE strength Baseline:  Goal status: IN PROGRESS  3.  Pt will have improved MiniBesTEST to >/=19/28 to be considered a reduced risk as a recurrent faller Baseline: 14 Goal status: IN PROGRESS  4.  Pt will have improved TUG to </=13.5 sec for reduced fall risk Baseline:  Goal status: IN PROGRESS  5.  Pt will be  able  to improve gait speed to >/=0.8 m/s to be considered a Tourist information centre manager for shopping Baseline: 0.66 m/s Goal status: IN PROGRESS   ASSESSMENT:  CLINICAL IMPRESSION: LE strengthening to improve power/activity tolerance progressed to increased weight with good tolerance.  Static and dynamic balance activities to facilitate righting reactions and postural stability w/ difficulty under multisensory conditions w/ tendency for backwards LOB. Treadmill training to increase gait speed and improve endurance performed in 2:1 work:rest format. Continued sessions to progress POC details to improve mobility and reduce risk fro falls.   From eval: Patient is a 89 y.o. F who was seen today for physical therapy evaluation and treatment for vascular Parkinson's. PMH is significant for decreased memory, and a-fib. Assessment demos generalized LE weakness, decreased activity tolerance, and high fall risk based on TUG, gait speed and miniBesTEST. Pt will benefit from PT to improve on these issues for home and community safety.   OBJECTIVE IMPAIRMENTS: Abnormal gait, decreased activity tolerance, decreased balance, decreased endurance, decreased mobility, difficulty walking, decreased ROM, decreased strength, improper body mechanics, and postural dysfunction.   ACTIVITY LIMITATIONS: standing, squatting, stairs, transfers, bed mobility, and locomotion level  PARTICIPATION LIMITATIONS: meal prep, cleaning, laundry, shopping, and community activity  PERSONAL FACTORS: Age, Fitness, Past/current experiences, and Time since onset of injury/illness/exacerbation are also affecting patient's functional outcome.   REHAB POTENTIAL: Good  CLINICAL DECISION MAKING: Evolving/moderate complexity  EVALUATION COMPLEXITY: Moderate  PLAN:  PT FREQUENCY: 2x/week  PT DURATION: 8 weeks  PLANNED INTERVENTIONS: 97164- PT Re-evaluation, 97750- Physical Performance Testing, 97110-Therapeutic exercises, 97530- Therapeutic  activity, [id number]- Neuromuscular re-education, 97535- Self Care, [id number]- Manual therapy, [id number]- Gait training, [phone]- Aquatic Therapy, [phone]- Electrical stimulation (unattended), Patient/Family education, Balance training, Stair training, Taping, Vestibular training, Cryotherapy, and Moist heat  PLAN FOR NEXT SESSION: Continue strength and balance HEP. Work on dual tasking with cognitive tasks. Work on foam and EC. Gait training   1:47 PM, 11/20/23 M. Kelly Halpin, PT, DPT Physical Therapist- [place] Office Number: [phone]

## 2023-11-21 ENCOUNTER — Encounter: Payer: Self-pay | Admitting: Family

## 2023-11-21 ENCOUNTER — Ambulatory Visit: Admitting: Family

## 2023-11-21 ENCOUNTER — Ambulatory Visit: Admitting: Physical Therapy

## 2023-11-21 VITALS — BP 128/78 | HR 67 | Temp 97.7°F | Resp 19 | Ht 61.0 in | Wt 166.8 lb

## 2023-11-21 DIAGNOSIS — R296 Repeated falls: Secondary | ICD-10-CM

## 2023-11-21 DIAGNOSIS — J0101 Acute recurrent maxillary sinusitis: Secondary | ICD-10-CM

## 2023-11-21 DIAGNOSIS — R2681 Unsteadiness on feet: Secondary | ICD-10-CM | POA: Diagnosis not present

## 2023-11-21 DIAGNOSIS — R0982 Postnasal drip: Secondary | ICD-10-CM | POA: Diagnosis not present

## 2023-11-21 LAB — POCT RAPID STREP A (OFFICE): Rapid Strep A Screen: NEGATIVE

## 2023-11-21 MED ORDER — AZITHROMYCIN 250 MG PO TABS: ORAL_TABLET | ORAL | 0 refills | Status: DC

## 2023-11-21 NOTE — Therapy (Incomplete)
 OUTPATIENT PHYSICAL THERAPY NEURO TREATMENT   Patient Name: Ashley Lawson MRN: [id number] DOB:[birth year], 89 y.o., female Today's Date: 11/21/2023   PCP: Leonarda Roxan BROCKS, NP REFERRING PROVIDER: Evonnie Asberry RAMAN, DO  END OF SESSION:      Past Medical History:  Diagnosis Date   Abdominal pain 12/2009   hospitalized   Allergic rhinitis    Anxiety    Bladder problem    Chronic constipation    Compression fx, lumbar spine (HCC)    DDD (degenerative disc disease), lumbar    Diverticulosis of colon    Dog bite(E906.0) 08/30/2011   GERD (gastroesophageal reflux disease)    Hepatic steatosis    pt denies   Hyperlipidemia    Hyperplastic colon polyp    IBS (irritable bowel syndrome)    constipation predominant   Internal hemorrhoids    Longstanding persistent atrial fibrillation (HCC)    Mitral regurgitation    Osteoporosis    dexa 2011 -2.7 hip nl spine   PMR (polymyalgia rheumatica) (HCC) 08/30/2011   under rheum care  on low dose pred 5 mg    Renal disorder    Syncope    Tricuspid regurgitation    Past Surgical History:  Procedure Laterality Date   ABDOMINAL HYSTERECTOMY     with bladder tact and rectocele repair   BLADDER SURGERY     Streched   CATARACT EXTRACTION  2010   both   COLONOSCOPY  2016 was most recent   CYSTO WITH HYDRODISTENSION N/A 10/05/2019   Procedure: CYSTOSCOPY/HYDRODISTENSION;  Surgeon: Gaston Hamilton, MD;  Location: [facility];  Service: Urology;  Laterality: N/A;   TONSILLECTOMY AND ADENOIDECTOMY     Patient Active Problem List   Diagnosis Date Noted   External hemorrhoids 03/03/2020   Incomplete bladder emptying 11/29/2019   Acquired thrombophilia (HCC) 11/29/2019   Incomplete emptying of bladder 11/29/2019   Syncope 06/17/2019   Seasonal allergies 06/17/2019   DDD (degenerative disc disease), lumbar    CKD (chronic kidney disease), stage III (HCC)    Internal hemorrhoids    Dizziness 09/17/2018   AF (paroxysmal atrial fibrillation)  (HCC) 09/17/2018   Chest discomfort 05/13/2018   Coagulation defect (HCC) 01/30/2018   Deviated septum 01/30/2018   Atrial fibrillation with RVR (HCC) 05/02/2017   Rhinitis, allergic 05/31/2015   Renal cyst incidental  01/19/2015   Heartburn 01/14/2014   GERD (gastroesophageal reflux disease) 01/14/2014   Osteoporosis 01/14/2014   IBS (irritable bowel syndrome)    Arthritis 02/19/2012   Screening, lipid 02/18/2011   Visit for preventive health examination 02/18/2011   Skin cancer 10/14/2010   Constipation, outlet dysfunction 01/15/2010   Lower urinary tract symptoms (LUTS) 01/15/2010   SUPRAPUBIC PAIN 08/01/2009   DIVERTICULOSIS, COLON 06/07/2007   Hyperlipidemia 06/05/2007   Anxiety 06/05/2007   Allergic rhinitis 06/05/2007   IBS 06/05/2007   OSTEOPOROSIS 06/05/2007    ONSET DATE: ~1 year  REFERRING DIAG: G21.4 (ICD-10-CM) - Vascular parkinsonism (HCC)  THERAPY DIAG:  No diagnosis found.  Rationale for Evaluation and Treatment: Rehabilitation  SUBJECTIVE:  SUBJECTIVE STATEMENT: Has had 2 additional falls in recent hx with one fall from bed scraping right arm and fell backwards off of bed and scraped back  From eval: Pt states she saw Dr. Evonnie who recommended PT for her balance. Reports she stumbles around -- feels it has worsened the past year. Doesn't feel safe walking around the circle in front of her home without supervision. Reports she doesn't trust herself going up/down stairs. Daughter states pt always has someone at home with her. Has a sitter on Tuesdays and Thursdays and daughter is there the rest of the days. Daughter notes that pt likes to shop but now only able to just go to one store before she fatigues.  Pt accompanied by: family member, daughter  PERTINENT HISTORY: dementia,  A-fib, chronic kidney disease  PAIN:  Are you having pain? No  PRECAUTIONS: Fall  WEIGHT BEARING RESTRICTIONS: No  FALLS: Has patient fallen in last 6 months? No  LIVING ENVIRONMENT: Lives with: lives with their daughter Lives in: House/apartment Stairs: No Has following equipment at home: Single point cane, Shower bench, Grab bars, and standard walker  PATIENT GOALS: Reduce stumbling while walking  OBJECTIVE:     TODAY'S TREATMENT: 11/24/23 Activity Comments                        HOME EXERCISE PROGRAM: Access Code: [email or web address] URL: [email or web address] Date: 10/22/2023 Prepared by: Gellen April Earnie Starring  Exercises - Heel Raises with Counter Support  - 1 x daily - 7 x weekly - 2 sets - 10 reps - Backward Walking with Counter Support  - 1 x daily - 7 x weekly - 5 sets - Side Stepping with Counter Support  - 1 x daily - 7 x weekly - 5 sets - Sit to Stand with Arm Reach Toward Target  - 1 x daily - 7 x weekly - 2 sets - 5 reps     Note: Objective measures were completed at Evaluation unless otherwise noted.  DIAGNOSTIC FINDINGS: 10/08/23 Brain MRI IMPRESSION: 1.  No evidence of an acute intracranial abnormality. 2. Prominence of the lateral and third ventricles which is at least partly related to moderate generalized cerebral atrophy. However, an acute callosal angle is also noted and there is mild crowding of the sulci at the level of the high frontoparietal lobes, and a component of normal pressure hydrocephalus (NPH) cannot be excluded. 3. Moderate cerebral white matter chronic small vessel ischemic disease.  COGNITION: Overall cognitive status: worsening memory  POSTURE: rounded shoulders, forward head, and increased thoracic kyphosis  LOWER EXTREMITY ROM:   Not tested  LOWER EXTREMITY MMT:    MMT Right Eval Left Eval  Hip flexion 4- 4-  Hip extension 4 4  Hip abduction 3+ 3+  Hip adduction 4 4  Hip internal rotation    Hip  external rotation    Knee flexion 4- 4-  Knee extension 5 5  Ankle dorsiflexion    Ankle plantarflexion 3+ 3+  Ankle inversion    Ankle eversion    (Blank rows = not tested)  BED MOBILITY:  Independent  TRANSFERS: Independent with some use of UEs for chair transfers  GAIT: Findings: Gait Characteristics: step through pattern, decreased step length- Right, decreased step length- Left, Right foot flat, Left foot flat, and shuffling, Distance walked: Into clinic, Level of assistance: SBA, and Comments: Gait speed: 0.66 m/s   FUNCTIONAL TESTS:  5 times sit to stand: 19.79 sec with  hands on knees, decreased eccentric control with sitting down, required 2 attempts to stand on 3rd sit<>stand [facility] PT Assessment - 10/15/23 0001       Standardized Balance Assessment   Standardized Balance Assessment Mini-BESTest;Timed Up and Go Test      Mini-BESTest   Sit To Stand Normal: Comes to stand without use of hands and stabilizes independently.    Rise to Toes Moderate: Heels up, but not full range (smaller than when holding hands), OR noticeable instability for 3 s.    Stand on one leg (left) Moderate: < 20 s    Stand on one leg (right) Moderate: < 20 s    Stand on one leg - lowest score 1    Compensatory Stepping Correction - Forward Normal: Recovers independently with a single, large step (second realignement is allowed).    Compensatory Stepping Correction - Backward No step, OR would fall if not caught, OR falls spontaneously.    Compensatory Stepping Correction - Left Lateral Moderate: Several steps to recover equilibrium    Compensatory Stepping Correction - Right Lateral Moderate: Several steps to recover equilibrium    Stepping Corredtion Lateral - lowest score 1    Stance - Feet together, eyes open, firm surface  Normal: 30s    Stance - Feet together, eyes closed, foam surface  Severe: Unable    Incline - Eyes Closed Moderate: Stands independently < 30s OR aligns with surface     Change in Gait Speed Moderate: Unable to change walking speed or signs of imbalance    Walk with head turns - Horizontal Moderate: performs head turns with reduction in gait speed.    Walk with pivot turns Moderate:Turns with feet close SLOW (>4 steps) with good balance.    Step over obstacles Moderate: Steps over box but touches box OR displays cautious behavior by slowing gait.    Timed UP & GO with Dual Task Severe: Stops counting while walking OR stops walking while counting.    Mini-BEST total score 14      Timed Up and Go Test   Normal TUG (seconds) 18.41    Cognitive TUG (seconds) 42.97       PATIENT SURVEYS:  Did not assess    PATIENT EDUCATION: Education details: HEP Person educated: Patient and Child(ren) Education method: Medical illustrator Education comprehension: verbalized understanding and needs further education   GOALS: Goals reviewed with patient? Yes  SHORT TERM GOALS: Target date: 11/12/2023   Pt will be able to perform HEP with SBA from daughter/sitter Baseline: Goal status: IN PROGRESS  2.  Pt will have improved controlled 5x STS while sitting down Baseline: Plops into chair, required multiple attempts to stand on 3rd sit to stand Goal status: IN PROGRESS  3.  Pt will have improved TUG to </=15 sec to work towards decreasing fall risk Baseline: 18.41 sec Goal status: IN PROGRESS  LONG TERM GOALS: Target date: 12/10/2023   Pt will be consistent with her HEP with family/sitter SBA Baseline:  Goal status: IN PROGRESS  2.  Pt will have improved 5x STS to </=13 sec for decreased fall risk and increased functional LE strength Baseline:  Goal status: IN PROGRESS  3.  Pt will have improved MiniBesTEST to >/=19/28 to be considered a reduced risk as a recurrent faller Baseline: 14 Goal status: IN PROGRESS  4.  Pt will have improved TUG to </=13.5 sec for reduced fall risk Baseline:  Goal status: IN PROGRESS  5.  Pt will be  able  to improve gait speed to >/=0.8 m/s to be considered a Tourist information centre manager for shopping Baseline: 0.66 m/s Goal status: IN PROGRESS   ASSESSMENT:  CLINICAL IMPRESSION: LE strengthening to improve power/activity tolerance progressed to increased weight with good tolerance.  Static and dynamic balance activities to facilitate righting reactions and postural stability w/ difficulty under multisensory conditions w/ tendency for backwards LOB. Treadmill training to increase gait speed and improve endurance performed in 2:1 work:rest format. Continued sessions to progress POC details to improve mobility and reduce risk fro falls.   From eval: Patient is a 89 y.o. F who was seen today for physical therapy evaluation and treatment for vascular Parkinson's. PMH is significant for decreased memory, and a-fib. Assessment demos generalized LE weakness, decreased activity tolerance, and high fall risk based on TUG, gait speed and miniBesTEST. Pt will benefit from PT to improve on these issues for home and community safety.   OBJECTIVE IMPAIRMENTS: Abnormal gait, decreased activity tolerance, decreased balance, decreased endurance, decreased mobility, difficulty walking, decreased ROM, decreased strength, improper body mechanics, and postural dysfunction.   ACTIVITY LIMITATIONS: standing, squatting, stairs, transfers, bed mobility, and locomotion level  PARTICIPATION LIMITATIONS: meal prep, cleaning, laundry, shopping, and community activity  PERSONAL FACTORS: Age, Fitness, Past/current experiences, and Time since onset of injury/illness/exacerbation are also affecting patient's functional outcome.   REHAB POTENTIAL: Good  CLINICAL DECISION MAKING: Evolving/moderate complexity  EVALUATION COMPLEXITY: Moderate  PLAN:  PT FREQUENCY: 2x/week  PT DURATION: 8 weeks  PLANNED INTERVENTIONS: 97164- PT Re-evaluation, 97750- Physical Performance Testing, 97110-Therapeutic exercises, 97530- Therapeutic  activity, [id number]- Neuromuscular re-education, 97535- Self Care, 02859- Manual therapy, [id number]- Gait training, [phone]- Aquatic Therapy, [phone]- Electrical stimulation (unattended), Patient/Family education, Balance training, Stair training, Taping, Vestibular training, Cryotherapy, and Moist heat  PLAN FOR NEXT SESSION: Continue strength and balance HEP. Work on dual tasking with cognitive tasks. Work on foam and EC. Gait training

## 2023-11-21 NOTE — Progress Notes (Signed)
 Provider: Roxan Plough FNP-C  Shadd Dunstan, Roxan BROCKS, NP  Patient Care Team: Jahmier Willadsen, Roxan BROCKS, NP as PCP - General (Family Medicine) Verlin Lonni BIRCH, MD as PCP - Cardiology (Cardiology) Gaston Hamilton, MD (Urology) Shlomo Fallow, MD (Dermatology) Leslee Reusing, MD (Ophthalmology) Gaspar Kung, MD (Orthopedic Surgery) lenon  (Rheumatology) Joshua Blamer, MD as Consulting Physician (Dermatology) Avram Lupita BRAVO, MD as Consulting Physician (Gastroenterology) Ethyl Lonni BRAVO, MD (Inactive) as Consulting Physician (Otolaryngology)  Extended Emergency Contact Information Primary Emergency Contact: Apple,Karen Address: [address]          [place] [address] [place] Home Phone: [phone] Mobile Phone: [phone] Relation: Daughter  Code Status:  Full Code  Goals of care: Advanced Directive information    11/21/2023   11:03 AM  Advanced Directives  Does Patient Have a Medical Advance Directive? Yes  Type of Advance Directive Healthcare Power of Attorney  Does patient want to make changes to medical advance directive? No - Patient declined  Copy of Healthcare Power of Attorney in Chart? Yes - validated most recent copy scanned in chart (See row information)     Chief Complaint  Patient presents with   Sinus Problem    Sinus draining.    Discussed the use of AI scribe software for clinical note transcription with the patient, who gave verbal consent to proceed.  History of Present Illness   Ashley Lawson is an 89 year old female who presents with sinus drainage and ear congestion.  She has been experiencing sinus drainage for over two weeks, accompanied by ear congestion. She uses Mucinex , Claritin , and Flonase  to manage her symptoms. Recently, her ears have felt 'stopped up'. No fever is present.  She experiences pain on the right side of her face but denies any frontal headache. She uses salt water rinses and cough drops to  alleviate her symptoms. The drainage is sometimes dripping, causing frequent throat clearing. No fever, nausea, or vomiting. She reports a dry throat and experiences tenderness in her throat and pain in her maxillary sinuses.  She has a history of falls, including one incident where she fell off the commode, resulting in a bruised back, and another where she fell on her arm, causing a scrape and bruise. Her daughter reports she uses bandages and Flex tape to manage the wound, which is healing slowly.   Past Medical History:  Diagnosis Date   Abdominal pain 12/2009   hospitalized   Allergic rhinitis    Anxiety    Bladder problem    Chronic constipation    Compression fx, lumbar spine (HCC)    DDD (degenerative disc disease), lumbar    Diverticulosis of colon    Dog bite(E906.0) 08/30/2011   GERD (gastroesophageal reflux disease)    Hepatic steatosis    pt denies   Hyperlipidemia    Hyperplastic colon polyp    IBS (irritable bowel syndrome)    constipation predominant   Internal hemorrhoids    Longstanding persistent atrial fibrillation (HCC)    Mitral regurgitation    Osteoporosis    dexa 2011 -2.7 hip nl spine   PMR (polymyalgia rheumatica) (HCC) 08/30/2011   under rheum care  on low dose pred 5 mg    Renal disorder    Syncope    Tricuspid regurgitation    Past Surgical History:  Procedure Laterality Date   ABDOMINAL HYSTERECTOMY     with bladder tact and rectocele repair   BLADDER SURGERY     Streched  CATARACT EXTRACTION  2010   both   COLONOSCOPY  2016 was most recent   CYSTO WITH HYDRODISTENSION N/A 10/05/2019   Procedure: CYSTOSCOPY/HYDRODISTENSION;  Surgeon: Gaston Hamilton, MD;  Location: [facility];  Service: Urology;  Laterality: N/A;   TONSILLECTOMY AND ADENOIDECTOMY      Allergies  Allergen Reactions   Alendronate Sodium     upset stomach   Penicillins     Has patient had a PCN reaction causing immediate rash, facial/tongue/throat swelling, SOB or  lightheadedness with hypotension: Yes Has patient had a PCN reaction causing severe rash involving mucus membranes or skin necrosis: No Has patient had a PCN reaction that required hospitalization: No Has patient had a PCN reaction occurring within the last 10 years: No If all of the above answers are NO, then may proceed with Cephalosporin use.    Ciprofloxacin      Had headaches; swelling of feet.    Amoxicillin Rash    Has patient had a PCN reaction causing immediate rash, facial/tongue/throat swelling, SOB or lightheadedness with hypotension: yes Has patient had a PCN reaction causing severe rash involving mucus membranes or skin necrosis: no Has patient had a PCN reaction that required hospitalization: no Has patient had PCN reaction within the last 10 years: no  If all of the above answers are NO, then may proceed with Cephalosporin use.  REACTION: unspecified   Metronidazole  Other (See Comments)    Pounding headaches patient says was bad.  With cipro  for diverticulitis rx. Other reaction(s): Other (See Comments) Pounding headaches patient says was bad.  With cipro  for diverticulitis rx.    Outpatient Encounter Medications as of 11/21/2023  Medication Sig   acetaminophen  (TYLENOL ) 500 MG tablet Take 500 mg by mouth at bedtime.   apixaban  (ELIQUIS ) 5 MG TABS tablet Take 1 tablet (5 mg total) by mouth 2 (two) times daily.   Ascorbic Acid (VITAMIN C) 100 MG tablet Take 500 mg by mouth daily.   azithromycin  (ZITHROMAX ) 250 MG tablet Take 2 tablets on day 1, then 1 tablet daily on days 2 through 5   calcium  carbonate (OS-CAL) 1250 (500 Ca) MG chewable tablet Chew 1 tablet by mouth daily.  (Patient taking differently: Chew 1 tablet by mouth as needed.)   diltiazem  (CARDIZEM  CD) 120 MG 24 hr capsule TAKE 1 CAPSULE BY MOUTH EVERY DAY   escitalopram  (LEXAPRO ) 10 MG tablet TAKE 1 TABLET BY MOUTH EVERYDAY AT BEDTIME   fluticasone  (FLONASE ) 50 MCG/ACT nasal spray INHALE 2 SPRAYS IN EACH  NOSTRIL DAILY X 1 WEEK, THEN 1-2 SPRAYS DAILY. USE LOWEST POSSIBLE DOSE AFTER 1 WEEK   guaiFENesin  (MUCINEX ) 600 MG 12 hr tablet Take 600 mg by mouth daily as needed.   loratadine  (CLARITIN ) 10 MG tablet TAKE 1 TABLET BY MOUTH EVERY DAY   Multiple Vitamin (MULTIVITAMIN) tablet Take 1 tablet by mouth daily.   polyethylene glycol powder (GLYCOLAX /MIRALAX ) 17 GM/SCOOP powder Take 17 g by mouth daily. Hold for loose stool   Probiotic Product (ALIGN) 4 MG CAPS Take 1 capsule by mouth as needed.   rosuvastatin  (CRESTOR ) 5 MG tablet TAKE 1 TABLET (5 MG TOTAL) BY MOUTH DAILY.   XIIDRA 5 % SOLN Apply 1 drop to eye 2 (two) times daily.   PREMARIN vaginal cream INSERT 1 GRAM VAGINALLY THREE TIMES A WEEKLY, THEN ONCE WEEKLY (Patient not taking: Reported on 11/21/2023)   No facility-administered encounter medications on file as of 11/21/2023.    Review of Systems  Constitutional:  Negative for appetite  change, chills, fatigue, fever and unexpected weight change.  HENT:  Positive for congestion, postnasal drip, rhinorrhea, sinus pressure, sinus pain and sore throat. Negative for dental problem, ear discharge, ear pain, facial swelling, hearing loss, nosebleeds, sneezing, tinnitus and trouble swallowing.   Eyes:  Negative for pain, discharge, redness, itching and visual disturbance.  Respiratory:  Negative for cough, chest tightness, shortness of breath and wheezing.   Cardiovascular:  Negative for chest pain, palpitations and leg swelling.  Gastrointestinal:  Negative for abdominal distention, abdominal pain, nausea and vomiting.  Musculoskeletal:  Positive for gait problem. Negative for arthralgias, back pain, joint swelling, myalgias, neck pain and neck stiffness.  Skin:  Negative for color change, pallor and rash.       Right upper arm abrasion sustained from fall episode at home   Neurological:  Negative for dizziness, syncope, speech difficulty, weakness, light-headedness, numbness and headaches.   Hematological:  Does not bruise/bleed easily.    Immunization History  Administered Date(s) Administered   Fluad Quad(high Dose 65+) 01/10/2020, 12/10/2021   Fluad Trivalent(High Dose 65+) 01/06/2023   Influenza Split 01/25/2011, 01/23/2012   Influenza Whole 12/31/2007   Influenza, High Dose Seasonal PF 01/19/2013, 01/10/2014, 01/17/2015, 01/18/2016, 01/02/2017, 01/05/2018, 01/05/2019, 01/05/2019, 02/07/2021   Influenza-Unspecified 01/02/2017   Moderna Sars-Covid-2 Vaccination 02/15/2022   PFIZER(Purple Top)SARS-COV-2 Vaccination 05/07/2019, 06/02/2019, 01/31/2020, 08/02/2020   Pfizer Covid-19 Vaccine Bivalent Booster 59yrs & up 02/19/2021   Pfizer(Comirnaty)Fall Seasonal Vaccine 12 years and older 02/07/2023   Pneumococcal Conjugate-13 02/23/2013   Pneumococcal Polysaccharide-23 04/01/2006, 04/01/2018   Td 04/01/2005   Td (Adult) 11/22/2016   Tdap 08/30/2011, 08/03/2021   Zoster Recombinant(Shingrix) 12/04/2016, 07/27/2017   Zoster, Live 04/02/2007   Pertinent  Health Maintenance Due  Topic Date Due   MAMMOGRAM  09/09/2022   INFLUENZA VACCINE  06/29/2024 (Originally 10/31/2023)   DEXA SCAN  Completed      02/24/2023    1:14 PM 05/02/2023    2:30 PM 08/27/2023    1:02 PM 09/15/2023   12:24 PM 11/21/2023   11:01 AM  Fall Risk  Falls in the past year? 1 0 1 0 1  Was there an injury with Fall? 0 0 0 0 1  Fall Risk Category Calculator 1 0 2 0 3  Patient at Risk for Falls Due to History of fall(s)  History of fall(s)  History of fall(s)  Fall risk Follow up Falls evaluation completed  Falls evaluation completed Falls evaluation completed    Functional Status Survey:    Vitals:   11/21/23 1105  BP: 128/78  Pulse: 67  Resp: 19  Temp: 97.7 F (36.5 C)  SpO2: 97%  Weight: 166 lb 12.8 oz (75.7 kg)  Height: 5' 1 (1.549 m)   Body mass index is 31.52 kg/m. Physical Exam VITALS: T- 97.7, P- 61, BP- 128/78, SaO2- 97% GENERAL: Alert, cooperative, well developed, no acute  distress. HEENT: Normocephalic, normal oropharynx, moist mucous membranes, tympanic membranes normal bilaterally, nasal passages normal. NECK: No cervical lymphadenopathy. CHEST: Clear to auscultation bilaterally, no wheezes, rhonchi, or crackles. CARDIOVASCULAR: Normal heart rate and rhythm, S1 and S2 normal without murmurs. ABDOMEN: Soft, non-tender, non-distended, without organomegaly, normal bowel sounds. EXTREMITIES: No cyanosis or edema. NEUROLOGICAL: Cranial nerves grossly intact, moves all extremities without gross motor or sensory deficit. SKIN: right lateral arm skin abrasion healing well, no redness or infection.TAO Meplix dressing applied.     Labs reviewed: Recent Labs    12/01/22 0953 02/12/23 0932 08/22/23 0901  NA 132* 141 139  K 3.9 4.9 4.4  CL 99 106 104  CO2 25 28 27   GLUCOSE 88 86 108*  BUN 9 11 14   CREATININE 0.69 0.92 0.87  CALCIUM  9.1 9.1 9.1   Recent Labs    12/01/22 0953 02/12/23 0932 08/22/23 0901  AST 22 18 18   ALT 19 14 12   ALKPHOS 81  --   --   BILITOT 1.0 0.7 0.8  PROT 7.7 6.6 6.8  ALBUMIN 4.1  --   --    Recent Labs    12/01/22 0953 02/12/23 0932 08/22/23 0901  WBC 7.9 6.6 5.8  NEUTROABS 5.4 4,046 3,149  HGB 13.2 12.3 12.2  HCT 39.5 38.4 39.0  MCV 94.0 98.0 99.0  PLT 227 233 208   Lab Results  Component Value Date   TSH 4.93 (H) 08/22/2023   Lab Results  Component Value Date   HGBA1C 5.9 (H) 08/22/2023   Lab Results  Component Value Date   CHOL 120 08/22/2023   HDL 59 08/22/2023   LDLCALC 41 08/22/2023   LDLDIRECT 100.0 04/07/2019   TRIG 112 08/22/2023   CHOLHDL 2.0 08/22/2023    Significant Diagnostic Results in last 30 days:  No results found.  Assessment/Plan  Acute maxillary sinusitis Acute maxillary sinusitis with right-sided facial pain and drainage for over two weeks. No fever present. Examination reveals drainage and tenderness in the maxillary sinus area. Negative for strep. Current treatment includes  Claritin , Mucinex , Flonase , and salt water gargles. Previous treatment with azithromycin  was effective. - Prescribe azithromycin  as previously effective. Take two pills on the first day, followed by one pill daily for the next four days. - Continue Claritin , Mucinex , and Flonase . - Encourage use of salt water gargles to reduce bacterial multiplication. - Advise to stay hydrated to alleviate throat dryness. - Monitor for any fever or increased pain, and take Tylenol  if needed.  Chronic throat dryness and postnasal drip Chronic throat dryness and postnasal drip likely exacerbated by Claritin  use. No signs of infection or abscess. Mucus drainage noted, causing throat irritation. - Continue Claritin  if drainage persists. - Encourage frequent sips of water to alleviate throat dryness.  Fall with minor trauma and skin laceration/abrasion Recent fall resulting in minor trauma and skin laceration/abrasion on the arm. Bruising noted on the back. The wound is healing well with no signs of infection. Skin is thin, contributing to easy bruising and laceration. - Continue using waterproof bandages during showers. - Apply triple antibiotic ointment to the wound. - Monitor for signs of infection such as redness, odor, or discharge. - Consider taking zinc supplements for two weeks to aid skin healing.  Unsteady gait  Impaired balance and increased fall risk noted, with recent falls occurring during transfers and getting into bed. Physical therapy is ongoing to improve balance and fall prevention techniques. She is resistant to using a walker but has a cane available. - Encourage use of a cane or walker to prevent falls. - Continue physical therapy sessions twice a week for balance training and fall prevention. - Consider adjusting bed height to prevent falls during transfers.   Family/ staff Communication: Reviewed plan of care with patient and daughter verbalized understanding   Labs/tests ordered: None    Next Appointment: Return if symptoms worsen or fail to improve.   Total time: 20 minutes. Greater than 50% of total time spent doing patient education regarding right maxillary sinusitis,PND,Fall episode,Unsteady gait ,health maintenance including symptom/medication management.   Roxan JAYSON Plough, NP

## 2023-11-24 ENCOUNTER — Ambulatory Visit: Admitting: Physical Therapy

## 2023-11-26 ENCOUNTER — Ambulatory Visit: Admitting: Physical Therapy

## 2023-11-28 ENCOUNTER — Telehealth: Payer: Self-pay | Admitting: Family

## 2023-11-28 NOTE — Telephone Encounter (Signed)
 Copied from CRM [id number]. Topic: Clinical - Medication Refill >> Nov 28, 2023  5:36 PM Mercer PEDLAR wrote: Medication: azithromycin  (ZITHROMAX ) 250 MG tablet  Has the patient contacted their pharmacy? Yes (Agent: If no, request that the patient contact the pharmacy for the refill. If patient does not wish to contact the pharmacy document the reason why and proceed with request.) (Agent: If yes, when and what did the pharmacy advise?)  This is the patient's preferred pharmacy:  [id number] [address] [facility] - [address] [place] [address] Phone: [phone] Fax: [phone]  Is this the correct pharmacy for this prescription? Yes If no, delete pharmacy and type the correct one.   Has the prescription been filled recently? No  Is the patient out of the medication? Yes  Has the patient been seen for an appointment in the last year OR does the patient have an upcoming appointment? Yes  Can we respond through MyChart? No  Agent: Please be advised that Rx refills may take up to 3 business days. We ask that you follow-up with your pharmacy.

## 2023-11-29 DIAGNOSIS — H6993 Unspecified Eustachian tube disorder, bilateral: Secondary | ICD-10-CM | POA: Diagnosis not present

## 2023-11-29 DIAGNOSIS — M26609 Unspecified temporomandibular joint disorder, unspecified side: Secondary | ICD-10-CM | POA: Diagnosis not present

## 2023-11-29 DIAGNOSIS — J329 Chronic sinusitis, unspecified: Secondary | ICD-10-CM | POA: Diagnosis not present

## 2023-12-02 ENCOUNTER — Ambulatory Visit: Payer: Self-pay | Admitting: *Deleted

## 2023-12-02 NOTE — Telephone Encounter (Signed)
 On chart review,patient was recent prescribed Doxycycline  at [facility] on 11/29/2023 and had azithromycin  on 11/21/2023. Recommend continuing on Doxycyline bacterial to prevent resistance to antibiotics.   Also consider referral to ENT specialist for evaluation of recurrent sinusitis.

## 2023-12-02 NOTE — Telephone Encounter (Signed)
 Attempted to call pt to triage regarding request for an antibiotic.   Left a voicemail to call [facility] back.   Looks like pt was seen on 11/29/2023 by [facility].

## 2023-12-02 NOTE — Telephone Encounter (Signed)
 Medication that the patient is requesting is not on her mediation list.  Message sent to Ngetich, Dinah C, NP

## 2023-12-03 ENCOUNTER — Ambulatory Visit: Admitting: Physical Therapy

## 2023-12-03 NOTE — Telephone Encounter (Signed)
 Left a voicemail message in regards on Ngetich, Dinah C, NP response for the patient to continuing on Doxycyline bacterial to prevent resistance to antibiotics.  Also consider referral to ENT specialist for evaluation of recurrent sinusitis.

## 2023-12-04 NOTE — Telephone Encounter (Addendum)
 Left message for patient to return call when available, on mobile number. Called number listed as home number, which appears to be patients daughter number, I left another message requesting a return call.  Reason for call: Replay providers response to message ( refer to Dinah's documentation)

## 2023-12-04 NOTE — Telephone Encounter (Signed)
 Message routed to Chrae.B/CMA. Please advise.

## 2023-12-04 NOTE — Telephone Encounter (Signed)
 Called patient and unable to leave voicemail. Additional attempt will be made tomorrow 12/05/2023

## 2023-12-04 NOTE — Telephone Encounter (Signed)
 Noted. I will attempt another phone call today

## 2023-12-04 NOTE — Telephone Encounter (Signed)
 [place], [place], [place] routed this conversation to Me (Selected Message) 12/04/23  8:06 AM

## 2023-12-05 ENCOUNTER — Ambulatory Visit: Admitting: Physical Therapy

## 2023-12-05 NOTE — Telephone Encounter (Signed)
 Daughter called and stated that patient would like to be seen in office. No appointments for today and daughter could not do Monday.  Daughter stated that she will have patient continue the Doxycyline and she will finish it on Monday and she scheduled an appointment in office for Tuesday at patient's request.

## 2023-12-05 NOTE — Telephone Encounter (Signed)
 Called patient and no answer. Voicemail was left with office call back number. Message routed to clinical intake for today Don[place]ell.M/CMA

## 2023-12-08 ENCOUNTER — Ambulatory Visit: Admitting: Physical Therapy

## 2023-12-09 ENCOUNTER — Encounter: Payer: Self-pay | Admitting: Sports Medicine

## 2023-12-09 ENCOUNTER — Ambulatory Visit: Admitting: Sports Medicine

## 2023-12-09 VITALS — BP 116/68 | HR 64 | Temp 97.1°F | Resp 18 | Ht 61.0 in | Wt 163.6 lb

## 2023-12-09 DIAGNOSIS — J3489 Other specified disorders of nose and nasal sinuses: Secondary | ICD-10-CM

## 2023-12-09 DIAGNOSIS — R6889 Other general symptoms and signs: Secondary | ICD-10-CM | POA: Diagnosis not present

## 2023-12-09 LAB — POCT INFLUENZA A/B
Influenza A, POC: NEGATIVE
Influenza B, POC: NEGATIVE

## 2023-12-09 MED ORDER — CALCIUM CARBONATE 1250 (500 CA) MG PO CHEW: 1.0000 | CHEWABLE_TABLET | ORAL | 0 refills | Status: DC | PRN

## 2023-12-09 NOTE — Progress Notes (Signed)
 Careteam: Patient Care Team: Ngetich, Roxan BROCKS, NP as PCP - General (Family Medicine) Verlin Lonni BIRCH, MD as PCP - Cardiology (Cardiology) Gaston Hamilton, MD (Urology) Shlomo Fallow, MD (Dermatology) Leslee Reusing, MD (Ophthalmology) Gaspar Kung, MD (Orthopedic Surgery) lenon  (Rheumatology) Joshua Blamer, MD as Consulting Physician (Dermatology) Avram Lupita BRAVO, MD as Consulting Physician (Gastroenterology) Ethyl Lonni BRAVO, MD (Inactive) as Consulting Physician (Otolaryngology)  PLACE OF SERVICE:  [facility]  Advanced Directive information    Allergies  Allergen Reactions   Alendronate Sodium     upset stomach   Penicillins     Has patient had a PCN reaction causing immediate rash, facial/tongue/throat swelling, SOB or lightheadedness with hypotension: Yes Has patient had a PCN reaction causing severe rash involving mucus membranes or skin necrosis: No Has patient had a PCN reaction that required hospitalization: No Has patient had a PCN reaction occurring within the last 10 years: No If all of the above answers are NO, then may proceed with Cephalosporin use.    Ciprofloxacin      Had headaches; swelling of feet.    Amoxicillin Rash    Has patient had a PCN reaction causing immediate rash, facial/tongue/throat swelling, SOB or lightheadedness with hypotension: yes Has patient had a PCN reaction causing severe rash involving mucus membranes or skin necrosis: no Has patient had a PCN reaction that required hospitalization: no Has patient had PCN reaction within the last 10 years: no  If all of the above answers are NO, then may proceed with Cephalosporin use.  REACTION: unspecified   Metronidazole  Other (See Comments)    Pounding headaches patient says was bad.  With cipro  for diverticulitis rx. Other reaction(s): Other (See Comments) Pounding headaches patient says was bad.  With cipro  for diverticulitis rx.    Chief Complaint  Patient  presents with   Sinus Problem    Patient complain Sinus Infection not getting better and stated that she is having drainage in her throat and she is taking z-pack and daugther stated that she not feeling well     Discussed the use of AI scribe software for clinical note transcription with the patient, who gave verbal consent to proceed.  History of Present Illness  Ashley Lawson is an 89 year old female who presents with persistent sinus and throat symptoms. She is accompanied by her daughter, [name].  She has been experiencing ongoing sinus and throat issues since August 22nd. Initially, she had a sore throat and ear pain without fever. A strep test was conducted, and she was prescribed a Z-Pak, but her symptoms persisted. During Labor Day weekend, she sought urgent care and was prescribed doxycycline , which she completed yesterday. Despite these treatments, she continues to experience throat pain and occasional ear discomfort.  She has a history of TMJ, which she believes contributes to her ear pain. She was also prescribed a prednisone  taper, which she has completed. Despite these treatments, she still reports intermittent ear pain and throat discomfort.  No fever has been present throughout this period, and she has been monitoring her temperature daily. Her appetite has decreased since August, with a preference for soups, although she managed to eat a barbecue sandwich last night. She experiences occasional lightheadedness when standing and has had some stomach discomfort, which she managed with yogurt.  No significant nasal drainage, though she occasionally experiences postnasal drip. No ear drainage, dizziness, vomiting, diarrhea, or urinary issues. She drinks a lot of water and has been using a nasal spray and  allergy medication regularly.  Her current medications include Eliquis  5 mg twice daily, vitamin C, Cardizem , Lexapro  10 mg, loratadine , Miralax , Crestor , and eye drops. She also  uses Mucinex  as needed for cough and has a prescription for Claritin , which she takes daily.    Review of Systems:  Review of Systems  Constitutional:  Negative for chills and fever.  HENT:  Positive for sinus pain. Negative for congestion, ear pain and sore throat.   Respiratory:  Negative for cough, sputum production and shortness of breath.   Cardiovascular:  Negative for chest pain, palpitations and leg swelling.  Gastrointestinal:  Negative for abdominal pain, heartburn and nausea.  Genitourinary:  Negative for dysuria.  Musculoskeletal:  Negative for falls and myalgias.  Neurological:  Negative for dizziness.   Negative unless indicated in HPI.   Past Medical History:  Diagnosis Date   Abdominal pain 12/2009   hospitalized   Allergic rhinitis    Anxiety    Bladder problem    Chronic constipation    Compression fx, lumbar spine (HCC)    DDD (degenerative disc disease), lumbar    Diverticulosis of colon    Dog bite(E906.0) 08/30/2011   GERD (gastroesophageal reflux disease)    Hepatic steatosis    pt denies   Hyperlipidemia    Hyperplastic colon polyp    IBS (irritable bowel syndrome)    constipation predominant   Internal hemorrhoids    Longstanding persistent atrial fibrillation (HCC)    Mitral regurgitation    Osteoporosis    dexa 2011 -2.7 hip nl spine   PMR (polymyalgia rheumatica) (HCC) 08/30/2011   under rheum care  on low dose pred 5 mg    Renal disorder    Syncope    Tricuspid regurgitation    Past Surgical History:  Procedure Laterality Date   ABDOMINAL HYSTERECTOMY     with bladder tact and rectocele repair   BLADDER SURGERY     Streched   CATARACT EXTRACTION  2010   both   COLONOSCOPY  2016 was most recent   CYSTO WITH HYDRODISTENSION N/A 10/05/2019   Procedure: CYSTOSCOPY/HYDRODISTENSION;  Surgeon: Gaston Hamilton, MD;  Location: [facility];  Service: Urology;  Laterality: N/A;   TONSILLECTOMY AND ADENOIDECTOMY     Social History:   reports  that she has never smoked. She has never used smokeless tobacco. She reports that she does not currently use alcohol. She reports that she does not use drugs.  Family History  Problem Relation Age of Onset   Stroke Mother 48   Heart attack Father 58   Alzheimer's disease Sister 39   Bladder Cancer Sister 39   Liver disease Brother 14   Crohn's disease Son    Colon cancer Neg Hx    Esophageal cancer Neg Hx    Stomach cancer Neg Hx    Rectal cancer Neg Hx    Liver cancer Neg Hx     Medications: Patient's Medications  New Prescriptions   No medications on file  Previous Medications   ACETAMINOPHEN  (TYLENOL ) 500 MG TABLET    Take 500 mg by mouth at bedtime.   APIXABAN  (ELIQUIS ) 5 MG TABS TABLET    Take 1 tablet (5 mg total) by mouth 2 (two) times daily.   ASCORBIC ACID (VITAMIN C) 100 MG TABLET    Take 500 mg by mouth daily.   CALCIUM  CARBONATE (OS-CAL) 1250 (500 CA) MG CHEWABLE TABLET    Chew 1 tablet by mouth daily.    DILTIAZEM  (CARDIZEM   CD) 120 MG 24 HR CAPSULE    TAKE 1 CAPSULE BY MOUTH EVERY DAY   ESCITALOPRAM  (LEXAPRO ) 10 MG TABLET    TAKE 1 TABLET BY MOUTH EVERYDAY AT BEDTIME   FLUTICASONE  (FLONASE ) 50 MCG/ACT NASAL SPRAY    INHALE 2 SPRAYS IN EACH NOSTRIL DAILY X 1 WEEK, THEN 1-2 SPRAYS DAILY. USE LOWEST POSSIBLE DOSE AFTER 1 WEEK   GUAIFENESIN  (MUCINEX ) 600 MG 12 HR TABLET    Take 600 mg by mouth daily as needed.   LORATADINE  (CLARITIN ) 10 MG TABLET    TAKE 1 TABLET BY MOUTH EVERY DAY   MULTIPLE VITAMIN (MULTIVITAMIN) TABLET    Take 1 tablet by mouth daily.   POLYETHYLENE GLYCOL POWDER (GLYCOLAX /MIRALAX ) 17 GM/SCOOP POWDER    Take 17 g by mouth daily. Hold for loose stool   PROBIOTIC PRODUCT (ALIGN) 4 MG CAPS    Take 1 capsule by mouth as needed.   ROSUVASTATIN  (CRESTOR ) 5 MG TABLET    TAKE 1 TABLET (5 MG TOTAL) BY MOUTH DAILY.   XIIDRA 5 % SOLN    Apply 1 drop to eye 2 (two) times daily.  Modified Medications   No medications on file  Discontinued Medications   No  medications on file    Physical Exam: Vitals:   12/09/23 0903  BP: 116/68  Pulse: 64  Resp: 18  Temp: (!) 97.1 F (36.2 C)  SpO2: 98%  Weight: 163 lb 9.6 oz (74.2 kg)  Height: 5' 1 (1.549 m)   Body mass index is 30.91 kg/m. BP Readings from Last 3 Encounters:  12/09/23 116/68  11/21/23 128/78  10/29/23 126/72   Wt Readings from Last 3 Encounters:  12/09/23 163 lb 9.6 oz (74.2 kg)  11/21/23 166 lb 12.8 oz (75.7 kg)  10/29/23 164 lb (74.4 kg)    Physical Exam Constitutional:      Appearance: Normal appearance.  HENT:     Head: Normocephalic and atraumatic.     Right Ear: Tympanic membrane normal.     Left Ear: Tympanic membrane normal.     Mouth/Throat:     Pharynx: No oropharyngeal exudate or posterior oropharyngeal erythema.     Comments: Mild Rt sinus tenderness Cardiovascular:     Rate and Rhythm: Normal rate and regular rhythm.  Pulmonary:     Effort: Pulmonary effort is normal. No respiratory distress.     Breath sounds: Normal breath sounds. No wheezing.  Abdominal:     General: Bowel sounds are normal. There is no distension.     Tenderness: There is no abdominal tenderness. There is no guarding or rebound.     Comments:    Musculoskeletal:        General: No swelling or tenderness.  Skin:    General: Skin is dry.  Neurological:     Mental Status: She is alert. Mental status is at baseline.     Sensory: No sensory deficit.     Motor: No weakness.     Labs reviewed: Basic Metabolic Panel: Recent Labs    02/12/23 0932 08/22/23 0901  NA 141 139  K 4.9 4.4  CL 106 104  CO2 28 27  GLUCOSE 86 108*  BUN 11 14  CREATININE 0.92 0.87  CALCIUM  9.1 9.1  TSH 4.11 4.93*   Liver Function Tests: Recent Labs    02/12/23 0932 08/22/23 0901  AST 18 18  ALT 14 12  BILITOT 0.7 0.8  PROT 6.6 6.8   No results for input(s): LIPASE, AMYLASE  in the last 8760 hours. No results for input(s): AMMONIA in the last 8760 hours. CBC: Recent Labs     02/12/23 0932 08/22/23 0901  WBC 6.6 5.8  NEUTROABS 4,046 3,149  HGB 12.3 12.2  HCT 38.4 39.0  MCV 98.0 99.0  PLT 233 208   Lipid Panel: Recent Labs    02/12/23 0932 08/22/23 0901  CHOL 149 120  HDL 55 59  LDLCALC 73 41  TRIG 119 112  CHOLHDL 2.7 2.0   TSH: Recent Labs    02/12/23 0932 08/22/23 0901  TSH 4.11 4.93*   A1C: Lab Results  Component Value Date   HGBA1C 5.9 (H) 08/22/2023    Assessment and Plan Assessment & Plan   1. Flu-like symptoms (Primary) Flu neg  2. Tenderness over maxillary sinus No fevers, symptoms improving though with still sinus tenderness On exam, mild Rt sinus tenderness,  TM - no redness, no bulging , no exudates Instructed to use flonase ,  Take claritin  daily   Other orders - calcium  carbonate (OS-CAL) 1250 (500 Ca) MG chewable tablet; Chew 1 tablet (1,250 mg total) by mouth as needed.  Dispense: 30 tablet; Refill: 0

## 2023-12-10 ENCOUNTER — Ambulatory Visit: Admitting: Physical Therapy

## 2023-12-11 ENCOUNTER — Other Ambulatory Visit: Payer: Self-pay

## 2023-12-11 ENCOUNTER — Encounter (HOSPITAL_COMMUNITY): Payer: Self-pay

## 2023-12-11 ENCOUNTER — Emergency Department (HOSPITAL_COMMUNITY)
Admission: EM | Admit: 2023-12-11 | Discharge: 2023-12-11 | Disposition: A | Discharge status: Home / Self Care | Attending: Emergency Medicine | Admitting: Emergency Medicine

## 2023-12-11 ENCOUNTER — Emergency Department (HOSPITAL_COMMUNITY)

## 2023-12-11 DIAGNOSIS — W19XXXA Unspecified fall, initial encounter: Secondary | ICD-10-CM

## 2023-12-11 DIAGNOSIS — M25531 Pain in right wrist: Secondary | ICD-10-CM | POA: Diagnosis not present

## 2023-12-11 DIAGNOSIS — I4891 Unspecified atrial fibrillation: Secondary | ICD-10-CM | POA: Insufficient documentation

## 2023-12-11 DIAGNOSIS — M25551 Pain in right hip: Secondary | ICD-10-CM | POA: Insufficient documentation

## 2023-12-11 DIAGNOSIS — R519 Headache, unspecified: Secondary | ICD-10-CM | POA: Diagnosis not present

## 2023-12-11 DIAGNOSIS — W182XXA Fall in (into) shower or empty bathtub, initial encounter: Secondary | ICD-10-CM | POA: Insufficient documentation

## 2023-12-11 DIAGNOSIS — Z7901 Long term (current) use of anticoagulants: Secondary | ICD-10-CM | POA: Insufficient documentation

## 2023-12-11 DIAGNOSIS — F0390 Unspecified dementia without behavioral disturbance: Secondary | ICD-10-CM | POA: Insufficient documentation

## 2023-12-11 DIAGNOSIS — I517 Cardiomegaly: Secondary | ICD-10-CM | POA: Diagnosis not present

## 2023-12-11 DIAGNOSIS — N183 Chronic kidney disease, stage 3 unspecified: Secondary | ICD-10-CM | POA: Insufficient documentation

## 2023-12-11 DIAGNOSIS — M19031 Primary osteoarthritis, right wrist: Secondary | ICD-10-CM | POA: Diagnosis not present

## 2023-12-11 DIAGNOSIS — S0990XA Unspecified injury of head, initial encounter: Secondary | ICD-10-CM | POA: Diagnosis not present

## 2023-12-11 DIAGNOSIS — G9389 Other specified disorders of brain: Secondary | ICD-10-CM | POA: Diagnosis not present

## 2023-12-11 DIAGNOSIS — I700 Atherosclerosis of aorta: Secondary | ICD-10-CM | POA: Diagnosis not present

## 2023-12-11 DIAGNOSIS — M47812 Spondylosis without myelopathy or radiculopathy, cervical region: Secondary | ICD-10-CM | POA: Diagnosis not present

## 2023-12-11 DIAGNOSIS — R918 Other nonspecific abnormal finding of lung field: Secondary | ICD-10-CM | POA: Diagnosis not present

## 2023-12-11 DIAGNOSIS — M7989 Other specified soft tissue disorders: Secondary | ICD-10-CM | POA: Diagnosis not present

## 2023-12-11 DIAGNOSIS — S199XXA Unspecified injury of neck, initial encounter: Secondary | ICD-10-CM | POA: Diagnosis not present

## 2023-12-11 DIAGNOSIS — M4312 Spondylolisthesis, cervical region: Secondary | ICD-10-CM | POA: Diagnosis not present

## 2023-12-11 LAB — COMPREHENSIVE METABOLIC PANEL WITH GFR
ALT: 13 U/L (ref 0–44)
AST: 23 U/L (ref 15–41)
Albumin: 3.7 g/dL (ref 3.5–5.0)
Alkaline Phosphatase: 57 U/L (ref 38–126)
Anion gap: 10 (ref 5–15)
BUN: 15 mg/dL (ref 8–23)
CO2: 23 mmol/L (ref 22–32)
Calcium: 9.3 mg/dL (ref 8.9–10.3)
Chloride: 100 mmol/L (ref 98–111)
Creatinine, Ser: 1 mg/dL (ref 0.44–1.00)
GFR, Estimated: 54 mL/min — ABNORMAL LOW (ref >60)
Glucose, Bld: 101 mg/dL — ABNORMAL HIGH (ref 70–99)
Potassium: 4.4 mmol/L (ref 3.5–5.1)
Sodium: 134 mmol/L — ABNORMAL LOW (ref 135–145)
Total Bilirubin: 0.7 mg/dL (ref 0.0–1.2)
Total Protein: 6.1 g/dL — ABNORMAL LOW (ref 6.5–8.1)

## 2023-12-11 LAB — RESP PANEL BY RT-PCR (RSV, FLU A&B, COVID)  RVPGX2
Influenza A by PCR: NEGATIVE
Influenza B by PCR: NEGATIVE
Resp Syncytial Virus by PCR: NEGATIVE
SARS Coronavirus 2 by RT PCR: NEGATIVE

## 2023-12-11 LAB — CBC WITH DIFFERENTIAL/PLATELET
Abs Immature Granulocytes: 0.05 K/uL (ref 0.00–0.07)
Basophils Absolute: 0.1 K/uL (ref 0.0–0.1)
Basophils Relative: 1 %
Eosinophils Absolute: 0.2 K/uL (ref 0.0–0.5)
Eosinophils Relative: 2 %
HCT: 38.7 % (ref 36.0–46.0)
Hemoglobin: 12.3 g/dL (ref 12.0–15.0)
Immature Granulocytes: 1 %
Lymphocytes Relative: 29 %
Lymphs Abs: 2.3 K/uL (ref 0.7–4.0)
MCH: 30.1 pg (ref 26.0–34.0)
MCHC: 31.8 g/dL (ref 30.0–36.0)
MCV: 94.9 fL (ref 80.0–100.0)
Monocytes Absolute: 0.8 K/uL (ref 0.1–1.0)
Monocytes Relative: 10 %
Neutro Abs: 4.7 K/uL (ref 1.7–7.7)
Neutrophils Relative %: 57 %
Platelets: 185 K/uL (ref 150–400)
RBC: 4.08 MIL/uL (ref 3.87–5.11)
RDW: 13.6 % (ref 11.5–15.5)
WBC: 8.1 K/uL (ref 4.0–10.5)
nRBC: 0 % (ref 0.0–0.2)

## 2023-12-11 LAB — URINALYSIS, ROUTINE W REFLEX MICROSCOPIC
Bacteria, UA: NONE SEEN
Bilirubin Urine: NEGATIVE
Glucose, UA: NEGATIVE mg/dL
Ketones, ur: NEGATIVE mg/dL
Leukocytes,Ua: NEGATIVE
Nitrite: NEGATIVE
Protein, ur: NEGATIVE mg/dL
Specific Gravity, Urine: 1.014 (ref 1.005–1.030)
pH: 6 (ref 5.0–8.0)

## 2023-12-11 NOTE — ED Triage Notes (Signed)
 Pt fell while getting out of the shower, landed on right hip and hit her head, no LOC, is on Eliquis . Pt has c/o right hip and right wrist pain

## 2023-12-11 NOTE — Discharge Instructions (Signed)
 Thank you for letting us  evaluate you today.  Your labs were within normal limits.  Your urine is noninfectious.  Your scan of your head and neck did not show any trauma.  Your x-ray of your wrist and hip did not show any fracture.  Please follow-up with primary care provider for routine medical complaints, annual visits  Return to emergency department you experience slurred speech, altered mentation that is not normal per baseline, inability to ambulate

## 2023-12-11 NOTE — ED Notes (Signed)
 Unsuccessful IV attempt x2.

## 2023-12-11 NOTE — ED Provider Notes (Signed)
 [place] EMERGENCY DEPARTMENT AT [facility] Provider Note   CSN: [id number] Arrival date & time: 12/11/23  8190     Patient presents with: No chief complaint on file.   Ashley Lawson is a 89 y.o. female with past medical history of HLD, diverticulosis, GERD, A-fib (on Eliquis ), CKD stage III presents to Emergency Department with daughter for evaluation of injury following fall.  Daughter reports that patient was taking a shower following dinner when daughter heard 2 loud bags.  She found mom in the shower with the shower doors off track and sitting on her bottom.  Patient complains of diffuse headache, right wrist pain, right hip pain.  Fall was 1 unwitnessed and unknown if had LOC.  Is on thinners  Patient's daughter also reports that patient has been sleeping more over the past 2 days. Denies NVD  Of note, has been having difficulty with short-term memory recall over past year.  She is followed by Dr. Corlis and has a history of dementia.  Recently had MRI on 10/08/2023 showing prominence of lateral and third ventricles and moderate cerebral white matter chronic small vessel ischemic disease.  {Add pertinent medical, surgical, social history, OB history to HPI:32947} HPI     Prior to Admission medications   Medication Sig Start Date End Date Taking? Authorizing Provider  acetaminophen  (TYLENOL ) 500 MG tablet Take 500 mg by mouth at bedtime.    [provider]  apixaban  (ELIQUIS ) 5 MG TABS tablet Take 1 tablet (5 mg total) by mouth 2 (two) times daily. 08/27/23   Ngetich, Dinah C, NP  Ascorbic Acid (VITAMIN C) 100 MG tablet Take 500 mg by mouth daily.    [provider]  calcium  carbonate (OS-CAL) 1250 (500 Ca) MG chewable tablet Chew 1 tablet (1,250 mg total) by mouth as needed. 12/09/23   Sherlynn Madden, MD  diltiazem  (CARDIZEM  CD) 120 MG 24 hr capsule TAKE 1 CAPSULE BY MOUTH EVERY DAY 12/16/22   Verlin Lonni BIRCH, MD  escitalopram  (LEXAPRO ) 10 MG  tablet TAKE 1 TABLET BY MOUTH EVERYDAY AT BEDTIME 10/28/23   Ngetich, Dinah C, NP  fluticasone  (FLONASE ) 50 MCG/ACT nasal spray INHALE 2 SPRAYS IN EACH NOSTRIL DAILY X 1 WEEK, THEN 1-2 SPRAYS DAILY. USE LOWEST POSSIBLE DOSE AFTER 1 WEEK 03/24/23   Ngetich, Dinah C, NP  guaiFENesin  (MUCINEX ) 600 MG 12 hr tablet Take 600 mg by mouth daily as needed.    [provider]  loratadine  (CLARITIN ) 10 MG tablet TAKE 1 TABLET BY MOUTH EVERY DAY 08/12/23   Ngetich, Dinah C, NP  Multiple Vitamin (MULTIVITAMIN) tablet Take 1 tablet by mouth daily.    [provider]  polyethylene glycol powder (GLYCOLAX /MIRALAX ) 17 GM/SCOOP powder Take 17 g by mouth daily. Hold for loose stool 04/26/20   Ngetich, Dinah C, NP  Probiotic Product (ALIGN) 4 MG CAPS Take 1 capsule by mouth as needed.    [provider]  rosuvastatin  (CRESTOR ) 5 MG tablet TAKE 1 TABLET (5 MG TOTAL) BY MOUTH DAILY. 11/10/23   Ngetich, Dinah C, NP  XIIDRA 5 % SOLN Apply 1 drop to eye 2 (two) times daily. 08/01/22   [provider]    Allergies: Alendronate sodium, Penicillins, Ciprofloxacin , Amoxicillin, and Metronidazole     Review of Systems  Musculoskeletal:  Negative for neck pain.    Updated Vital Signs BP 121/68   Pulse 60   Temp 97.8 F (36.6 C) (Oral)   Resp 14   SpO2 99%  Physical Exam Vitals and nursing note reviewed.  Constitutional:      General: She is not in acute distress.    Appearance: Normal appearance. She is not diaphoretic.  HENT:     Head: Normocephalic and atraumatic.     Comments: No hematoma nor TTP of cranium No crepitus to facial bones    Right Ear: External ear normal. No hemotympanum.     Left Ear: External ear normal. No hemotympanum.     Nose: Nose normal.     Right Nostril: No epistaxis or septal hematoma.     Left Nostril: No epistaxis or septal hematoma.     Mouth/Throat:     Mouth: Mucous membranes are moist. No injury or lacerations.  Eyes:     General: Lids are  normal. Vision grossly intact. No visual field deficit.       Right eye: No discharge.        Left eye: No discharge.     Extraocular Movements: Extraocular movements intact.     Right eye: Normal extraocular motion and no nystagmus.     Left eye: Normal extraocular motion and no nystagmus.     Conjunctiva/sclera: Conjunctivae normal.     Pupils: Pupils are equal, round, and reactive to light.     Comments: No subconjunctival hemorrhage, hyphema, tear drop pupil, or fluid leakage bilaterally  Neck:     Vascular: No carotid bruit.  Cardiovascular:     Rate and Rhythm: Normal rate.     Pulses: Normal pulses.          Radial pulses are 2+ on the right side and 2+ on the left side.       Dorsalis pedis pulses are 2+ on the right side and 2+ on the left side.  Pulmonary:     Effort: Pulmonary effort is normal. No respiratory distress.     Breath sounds: Normal breath sounds. No wheezing.  Chest:     Chest wall: No tenderness.  Abdominal:     General: Bowel sounds are normal. There is no distension.     Palpations: Abdomen is soft.     Tenderness: There is no abdominal tenderness. There is no guarding or rebound.  Musculoskeletal:     Cervical back: Full passive range of motion without pain, normal range of motion and neck supple. No deformity, rigidity or bony tenderness. Normal range of motion.     Thoracic back: No deformity or bony tenderness. Normal range of motion.     Lumbar back: No deformity or bony tenderness. Normal range of motion.     Right hip: No bony tenderness or crepitus.     Left hip: No bony tenderness or crepitus.     Right lower leg: No edema.     Left lower leg: No edema.     Comments: No obvious deformity to joints or long bones Pelvis stable with no shortening or rotation of LE bilaterally  Skin:    General: Skin is warm and dry.     Capillary Refill: Capillary refill takes less than 2 seconds.     Coloration: Skin is not jaundiced or pale.  Neurological:      General: No focal deficit present.     Mental Status: She is alert. Mental status is at baseline.     GCS: GCS eye subscore is 4. GCS verbal subscore is 5. GCS motor subscore is 6.     Cranial Nerves: Cranial nerves 2-12 are intact. No cranial nerve deficit,  dysarthria or facial asymmetry.     Sensory: Sensation is intact. No sensory deficit.     Motor: Motor function is intact. No weakness, tremor, atrophy, abnormal muscle tone, seizure activity or pronator drift.     Coordination: Coordination is intact. Coordination normal. Finger-Nose-Finger Test and Heel to [facility] Test normal.     Gait: Gait is intact. Gait normal.     Deep Tendon Reflexes: Reflexes are normal and symmetric. Reflexes normal.     Comments: A&Ox2 not alert to current yr per baseline.  following commands appropriately     (all labs ordered are listed, but only abnormal results are displayed) Labs Reviewed - No data to display  EKG: None  Radiology: No results found.  {Document cardiac monitor, telemetry assessment procedure when appropriate:32947} Procedures   Medications Ordered in the ED - No data to display    {Click here for ABCD2, HEART and other calculators REFRESH Note before signing:1}                              Medical Decision Making Amount and/or Complexity of Data Reviewed Labs: ordered. Radiology: ordered.   ***  {Document critical care time when appropriate  Document review of labs and clinical decision tools ie [id number], etc  Document your independent review of radiology images and any outside records  Document your discussion with family members, caretakers and with consultants  Document social determinants of health affecting pt's care  Document your decision making why or why not admission, treatments were needed:32947:::1}   Final diagnoses:  None    ED Discharge Orders     None

## 2023-12-24 DIAGNOSIS — I739 Peripheral vascular disease, unspecified: Secondary | ICD-10-CM | POA: Diagnosis not present

## 2024-01-07 ENCOUNTER — Other Ambulatory Visit: Payer: Self-pay | Admitting: Cardiovascular Disease

## 2024-01-09 ENCOUNTER — Ambulatory Visit: Payer: Medicare Other | Admitting: Family

## 2024-01-09 ENCOUNTER — Encounter: Payer: Self-pay | Admitting: Family

## 2024-01-09 ENCOUNTER — Other Ambulatory Visit (INDEPENDENT_AMBULATORY_CARE_PROVIDER_SITE_OTHER): Payer: Self-pay | Admitting: Family

## 2024-01-09 VITALS — BP 108/62 | HR 62 | Temp 97.7°F | Ht 61.0 in | Wt 166.6 lb

## 2024-01-09 DIAGNOSIS — Z0000 Encounter for general adult medical examination without abnormal findings: Secondary | ICD-10-CM | POA: Diagnosis not present

## 2024-01-09 DIAGNOSIS — Z23 Encounter for immunization: Secondary | ICD-10-CM

## 2024-01-09 DIAGNOSIS — R300 Dysuria: Secondary | ICD-10-CM | POA: Diagnosis not present

## 2024-01-09 LAB — POCT URINALYSIS DIPSTICK
Bilirubin, UA: NEGATIVE
Glucose, UA: NEGATIVE
Leukocytes, UA: NEGATIVE
Nitrite, UA: NEGATIVE
Protein, UA: POSITIVE — AB
Spec Grav, UA: 1.01 (ref 1.010–1.025)
Urobilinogen, UA: 0.2 U/dL
pH, UA: 6.5 (ref 5.0–8.0)

## 2024-01-09 NOTE — Progress Notes (Signed)
 Subjective:   Ashley Lawson is a 89 y.o. female who presents for Medicare Annual (Subsequent) preventive examination.  Visit Complete: In person  Patient Medicare AWV questionnaire was completed by the patient on 01/09/2024; I have confirmed that all information answered by patient is correct and no changes since this date.  Cardiac Risk Factors include: advanced age (>29men, >11 women);obesity (BMI >30kg/m2);hypertension;sedentary lifestyle     Objective:    Today's Vitals   01/09/24 1250 01/09/24 1311  BP: 108/62   Pulse: 62   Temp: 97.7 F (36.5 C)   SpO2: 95%   Weight: 166 lb 9.6 oz (75.6 kg)   Height: 5' 1 (1.549 m)   PainSc:  6    Body mass index is 31.48 kg/m.     11/21/2023   11:03 AM 10/29/2023    3:27 PM 10/15/2023   12:59 PM 05/02/2023    2:31 PM 02/12/2023    7:42 PM 01/06/2023   12:54 PM 12/06/2022    2:51 PM  Advanced Directives  Does Patient Have a Medical Advance Directive? Yes Yes Yes Yes Yes Yes Yes  Type of Diplomatic Services operational officer Living will Healthcare Power of [place];Living will Healthcare Power of [place];Living will Healthcare Power of [place];Living will;Out of facility DNR (pink MOST or yellow form) Living will Healthcare Power of [place];Living will  Does patient want to make changes to medical advance directive? No - Patient declined  No - Patient declined No - Guardian declined  Yes (Inpatient - patient defers changing a medical advance directive at this time - Information given) Yes (Inpatient - patient defers changing a medical advance directive at this time - Information given)  Copy of Healthcare Power of Attorney in Chart? Yes - validated most recent copy scanned in chart (See row information)   Yes - validated most recent copy scanned in chart (See row information) No - copy requested Yes - validated most recent copy scanned in chart (See row information) Yes - validated most recent copy scanned in chart (See row  information)    Current Medications (verified) Outpatient Encounter Medications as of 01/09/2024  Medication Sig   acetaminophen  (TYLENOL ) 500 MG tablet Take 500 mg by mouth at bedtime.   apixaban  (ELIQUIS ) 5 MG TABS tablet Take 1 tablet (5 mg total) by mouth 2 (two) times daily.   Ascorbic Acid (VITAMIN C) 100 MG tablet Take 500 mg by mouth daily.   calcium  carbonate (OS-CAL) 1250 (500 Ca) MG chewable tablet Chew 1 tablet (1,250 mg total) by mouth as needed.   diltiazem  (CARDIZEM  CD) 120 MG 24 hr capsule Take 1 capsule (120 mg total) by mouth daily.   escitalopram  (LEXAPRO ) 10 MG tablet TAKE 1 TABLET BY MOUTH EVERYDAY AT BEDTIME   fluticasone  (FLONASE ) 50 MCG/ACT nasal spray INHALE 2 SPRAYS IN EACH NOSTRIL DAILY X 1 WEEK, THEN 1-2 SPRAYS DAILY. USE LOWEST POSSIBLE DOSE AFTER 1 WEEK   guaiFENesin  (MUCINEX ) 600 MG 12 hr tablet Take 600 mg by mouth daily as needed.   loratadine  (CLARITIN ) 10 MG tablet TAKE 1 TABLET BY MOUTH EVERY DAY   Multiple Vitamin (MULTIVITAMIN) tablet Take 1 tablet by mouth daily.   polyethylene glycol powder (GLYCOLAX /MIRALAX ) 17 GM/SCOOP powder Take 17 g by mouth daily. Hold for loose stool   PREMARIN vaginal cream Place 1 applicator vaginally as needed.   Probiotic Product (ALIGN) 4 MG CAPS Take 1 capsule by mouth as needed.   rosuvastatin  (CRESTOR ) 5 MG tablet TAKE 1 TABLET (  5 MG TOTAL) BY MOUTH DAILY.   XIIDRA 5 % SOLN Apply 1 drop to eye as needed (When daugther reminds her).   No facility-administered encounter medications on file as of 01/09/2024.    Allergies (verified) Alendronate sodium, Penicillins, Ciprofloxacin , Amoxicillin, and Metronidazole    History: Past Medical History:  Diagnosis Date   Abdominal pain 12/2009   hospitalized   Allergic rhinitis    Anxiety    Bladder problem    Chronic constipation    Compression fx, lumbar spine (HCC)    DDD (degenerative disc disease), lumbar    Diverticulosis of colon    Dog bite(E906.0) 08/30/2011    GERD (gastroesophageal reflux disease)    Hepatic steatosis    pt denies   Hyperlipidemia    Hyperplastic colon polyp    IBS (irritable bowel syndrome)    constipation predominant   Internal hemorrhoids    Longstanding persistent atrial fibrillation (HCC)    Mitral regurgitation    Osteoporosis    dexa 2011 -2.7 hip nl spine   PMR (polymyalgia rheumatica) 08/30/2011   under rheum care  on low dose pred 5 mg    Renal disorder    Syncope    Tricuspid regurgitation    Past Surgical History:  Procedure Laterality Date   ABDOMINAL HYSTERECTOMY     with bladder tact and rectocele repair   BLADDER SURGERY     Streched   CATARACT EXTRACTION  2010   both   COLONOSCOPY  2016 was most recent   CYSTO WITH HYDRODISTENSION N/A 10/05/2019   Procedure: CYSTOSCOPY/HYDRODISTENSION;  Surgeon: Gaston Hamilton, MD;  Location: [facility];  Service: Urology;  Laterality: N/A;   TONSILLECTOMY AND ADENOIDECTOMY     Family History  Problem Relation Age of Onset   Stroke Mother 67   Heart attack Father 36   Alzheimer's disease Sister 19   Bladder Cancer Sister 66   Liver disease Brother 39   Crohn's disease Son    Colon cancer Neg Hx    Esophageal cancer Neg Hx    Stomach cancer Neg Hx    Rectal cancer Neg Hx    Liver cancer Neg Hx    Social History   Socioeconomic History   Marital status: Widowed    Spouse name: Not on file   Number of children: Not on file   Years of education: Not on file   Highest education level: Not on file  Occupational History   Occupation: retired  Tobacco Use   Smoking status: Never   Smokeless tobacco: Never  Vaping Use   Vaping status: Never Used  Substance and Sexual Activity   Alcohol use: Not Currently   Drug use: Never   Sexual activity: Not Currently  Other Topics Concern   Not on file  Social History Narrative   Retired   Regular exercise- yes   Widowed   At home with children and GKs    HH of 2     Puppy   Plays cards is social and active        Diet:  No      Do you drink/ eat things with caffeine? Yes      Marital status:   Widow                            What year were you married ? [id number]      Do you live in a house, apartment,assistred living, condo,  trailer, etc.)? Townhouse      Is it one or more stories? 2      How many persons live in your home ? 2      Do you have any pets in your home ?(please list)  None      Highest Level of education completed:  12 th Grade      Current or past profession: Retail Sales @ [employer]      Do you exercise?    Some                          Type & how often  Walking      ADVANCED DIRECTIVES (Please bring copies)      Do you have a living will? Yes      Do you have a DNR form?  Yes                     If not, do you want to discuss one?       Do you have signed POA?HPOA forms? Yes                If so, please bring to your appointment      FUNCTIONAL STATUS- To be completed by Spouse / child / Staff       Do you have difficulty bathing or dressing yourself ? No      Do you have difficulty preparing food or eating ?  No      Do you have difficulty managing your mediation ? No      Do you have difficulty managing your finances ? No      Do you have difficulty affording your medication ? No      Social Drivers of Corporate investment banker Strain: Not on file  Food Insecurity: No Food Insecurity (12/05/2022)   Hunger Vital Sign    Worried About Running Out of Food in the Last Year: Never true    Ran Out of Food in the Last Year: Never true  Transportation Needs: No Transportation Needs (12/05/2022)   PRAPARE - Administrator, Civil Service (Medical): No    Lack of Transportation (Non-Medical): No  Physical Activity: Not on file  Stress: Not on file  Social Connections: Not on file    Tobacco Counseling Counseling given: Not Answered   Clinical Intake:  Pre-visit preparation completed: No  Pain : 0-10 Pain Score: 6  Pain Type: Chronic  pain Pain Location: Hand Pain Orientation: Right Pain Radiating Towards: No Pain Descriptors / Indicators: Aching Pain Onset: More than a month ago Pain Frequency: Intermittent Pain Relieving Factors: tylenol  Effect of Pain on Daily Activities: Needs assist with opening  Pain Relieving Factors: tylenol   BMI - recorded: 31.48 Nutritional Status: BMI > 30  Obese Nutritional Risks: None Diabetes: No  How often do you need to have someone help you when you read instructions, pamphlets, or other written materials from your doctor or pharmacy?: 5 - Always What is the last grade level you completed in school?: 12 Grade  Interpreter Needed?: No      Activities of Daily Living    01/09/2024    1:22 PM  In your present state of health, do you have any difficulty performing the following activities:  Hearing? 1  Vision? 0  Difficulty concentrating or making decisions? 1  Comment concentrating and remembering  Walking or climbing stairs? 1  Dressing or bathing? 0  Doing errands, shopping? 1  Comment daughter Ship broker and eating ? Y  Comment daughter assist  Using the Toilet? N  In the past six months, have you accidently leaked urine? N  Do you have problems with loss of bowel control? N  Managing your Medications? Y  Comment daughter assist  Managing your Finances? Y  Comment daughter assist  Housekeeping or managing your Housekeeping? Y  Comment daughter assist    Patient Care Team: Aoi Kouns, Roxan BROCKS, NP as PCP - General (Family Medicine) Verlin Lonni BIRCH, MD as PCP - Cardiology (Cardiology) Gaston Hamilton, MD (Urology) Shlomo Fallow, MD (Dermatology) Leslee Reusing, MD (Ophthalmology) Gaspar Kung, MD (Orthopedic Surgery) lenon  (Rheumatology) Joshua Blamer, MD as Consulting Physician (Dermatology) Avram Lupita BRAVO, MD as Consulting Physician (Gastroenterology) Ethyl Lonni BRAVO, MD (Inactive) as Consulting Physician  (Otolaryngology)  Indicate any recent Medical Services you may have received from other than [facility] providers in the past year (date may be approximate).     Assessment:   This is a routine wellness examination for [place].  Hearing/Vision screen Hearing Screening - Comments:: Patient stated her hearing is pretty good , daughter stated they went to ENT and patient lost 40% of her hearing Vision Screening - Comments:: Patient stated vision is good.   Goals Addressed               This Visit's Progress     p (pt-stated)   Not on track     Would like to walk better       Patient Stated   Not on track     - stay independent       Depression Screen    12/09/2023    9:28 AM 05/02/2023    2:30 PM 01/06/2023   12:54 PM 12/06/2022    2:52 PM 11/27/2022   10:52 AM 11/22/2022    1:07 PM 08/21/2022    8:56 AM  PHQ 2/9 Scores  PHQ - 2 Score 0 0 0 0 0 0 0  PHQ- 9 Score      0     Fall Risk    12/09/2023    9:27 AM 11/21/2023   11:01 AM 09/15/2023   12:24 PM 08/27/2023    1:02 PM 05/02/2023    2:30 PM  Fall Risk   Falls in the past year? 1 1 0 1 0  Number falls in past yr: 0 1 0 1 0  Injury with Fall? 1 1 0 0 0  Risk for fall due to :  History of fall(s)  History of fall(s)   Follow up   Falls evaluation completed Falls evaluation completed     MEDICARE RISK AT HOME: Medicare Risk at Home Any stairs in or around the home?: Yes If so, are there any without handrails?: No Home free of loose throw rugs in walkways, pet beds, electrical cords, etc?: Yes Adequate lighting in your home to reduce risk of falls?: Yes Life alert?: No Use of a cane, walker or w/c?: Yes (a cane sometimes) Grab bars in the bathroom?: Yes Shower chair or bench in shower?: Yes Elevated toilet seat or a handicapped toilet?: Yes  TIMED UP AND GO:  Was the test performed?  Yes  Length of time to ambulate 10 feet: 15 sec Gait slow and steady without use of assistive device    Cognitive Function:     01/06/2023   12:57 PM 01/10/2020  4:43 PM  MMSE - Mini Mental State Exam  Orientation to time 2 5  Orientation to Place 5 5  Registration 3 3  Attention/ Calculation 3 5  Recall 0 3  Language- name 2 objects 2 2  Language- repeat 1 1  Language- follow 3 step command 3 3  Language- read & follow direction 1 1  Write a sentence 1 1  Copy design 0 0  Copy design-comments  no to clock  Total score 21 29      09/15/2023   12:00 PM  Montreal Cognitive Assessment   Visuospatial/ Executive (0/5) 0  Naming (0/3) 1  Attention: Read list of digits (0/2) 2  Attention: Read list of letters (0/1) 1  Attention: Serial 7 subtraction starting at 100 (0/3) 0  Language: Repeat phrase (0/2) 1  Language : Fluency (0/1) 1  Abstraction (0/2) 0  Delayed Recall (0/5) 1  Orientation (0/6) 4  Total 11  Adjusted Score (based on education) 12      01/09/2024   12:57 PM 01/01/2022   12:56 PM 12/13/2020    3:12 PM  6CIT Screen  What Year? 4 points 0 points 0 points  What month? 3 points 0 points 0 points  What time? 0 points 0 points 0 points  Count back from 20 0 points 0 points 0 points  Months in reverse 4 points 0 points 0 points  Repeat phrase 10 points 0 points 2 points  Total Score 21 points 0 points 2 points    Immunizations Immunization History  Administered Date(s) Administered   Fluad Quad(high Dose 65+) 01/10/2020, 12/10/2021   Fluad Trivalent(High Dose 65+) 01/06/2023   INFLUENZA, HIGH DOSE SEASONAL PF 01/19/2013, 01/10/2014, 01/17/2015, 01/18/2016, 01/02/2017, 01/05/2018, 01/05/2019, 01/05/2019, 02/07/2021, 01/09/2024   Influenza Split 01/25/2011, 01/23/2012   Influenza Whole 12/31/2007   Influenza-Unspecified 01/02/2017   Moderna Sars-Covid-2 Vaccination 02/15/2022   PFIZER(Purple Top)SARS-COV-2 Vaccination 05/07/2019, 06/02/2019, 01/31/2020, 08/02/2020   Pfizer Covid-19 Vaccine Bivalent Booster 35yrs & up 02/19/2021   Pfizer(Comirnaty)Fall Seasonal Vaccine 12 years and  older 02/07/2023   Pneumococcal Conjugate-13 02/23/2013   Pneumococcal Polysaccharide-23 04/01/2006, 04/01/2018   Td 04/01/2005   Td (Adult) 11/22/2016   Tdap 08/30/2011, 08/03/2021   Zoster Recombinant(Shingrix) 12/04/2016, 07/27/2017   Zoster, Live 04/02/2007    TDAP status: Up to date  Flu Vaccine status: Up to date  Pneumococcal vaccine status: Up to date  Covid-19 vaccine status: Information provided on how to obtain vaccines.   Qualifies for Shingles Vaccine? Yes   Zostavax completed No   Shingrix Completed?: Yes  Screening Tests Health Maintenance  Topic Date Due   Mammogram  09/09/2022   COVID-19 Vaccine (8 - 2025-26 season) 12/01/2023   Medicare Annual Wellness (AWV)  01/08/2025   DTaP/Tdap/Td (5 - Td or Tdap) 08/04/2031   Pneumococcal Vaccine: 50+ Years  Completed   Influenza Vaccine  Completed   DEXA SCAN  Completed   Zoster Vaccines- Shingrix  Completed   Meningococcal B Vaccine  Aged Out    Health Maintenance  Health Maintenance Due  Topic Date Due   Mammogram  09/09/2022   COVID-19 Vaccine (8 - 2025-26 season) 12/01/2023    Colorectal cancer screening: No longer required.   Mammogram status: Ordered will reschedule appointment . Pt provided with contact info and advised to call to schedule appt.   Bone Density status: Completed 04/21/2019. Results reflect: Bone density results: OSTEOPOROSIS. Repeat every 5 years.  Lung Cancer Screening: (Low Dose CT Chest recommended if Age  50-80 years, 20 pack-year currently smoking OR have quit w/in 15years.) does not qualify.   Lung Cancer Screening Referral: N/A   Additional Screening:  Hepatitis C Screening: does not qualify; Completed N/A   Vision Screening: Recommended annual ophthalmology exams for early detection of glaucoma and other disorders of the eye. Is the patient up to date with their annual eye exam?  Yes  Who is the provider or what is the name of the office in which the patient attends  annual eye exams? Dr.McCueen  If pt is not established with a provider, would they like to be referred to a provider to establish care? No .   Dental Screening: Recommended annual dental exams for proper oral hygiene  Diabetic Foot Exam: Diabetic Foot Exam: Completed N/A   Community Resource Referral / Chronic Care Management: CRR required this visit?  No   CCM required this visit?  No     Plan:     I have personally reviewed and noted the following in the patient's chart:   Medical and social history Use of alcohol, tobacco or illicit drugs  Current medications and supplements including opioid prescriptions. Patient is not currently taking opioid prescriptions. Functional ability and status Nutritional status Physical activity Advanced directives List of other physicians Hospitalizations, surgeries, and ER visits in previous 12 months Vitals Screenings to include cognitive, depression, and falls Referrals and appointments  In addition, I have reviewed and discussed with patient certain preventive protocols, quality metrics, and best practice recommendations. A written personalized care plan for preventive services as well as general preventive health recommendations were provided to patient.     Roxan JAYSON Plough, NP   01/09/2024   After Visit Summary: (In Person-Printed) AVS printed and given to the patient  Nurse Notes: Advised to get COVID-19 vaccine at the Pharmacy

## 2024-01-09 NOTE — Patient Instructions (Signed)
 [name] , Thank you for taking time to come for your Medicare Wellness Visit. I appreciate your ongoing commitment to your health goals. Please review the following plan we discussed and let me know if I can assist you in the future.   Screening recommendations/referrals: Colonoscopy : N/A  Mammogram : Please reschedule appointment  Bone Density : Up to date  Recommended yearly ophthalmology/optometry visit for glaucoma screening and checkup Recommended yearly dental visit for hygiene and checkup  Vaccinations: Influenza vaccine- due annually in September/October Pneumococcal vaccine : Up to date  Tdap vaccine : Up to date  Shingles vaccine : Up to date     Advanced directives: yes   Conditions/risks identified:  advanced age (>22men, >65 women);obesity (BMI >30kg/m2);hypertension;sedentary lifestyle  Next appointment: 1 year    Preventive Care 68 Years and Older, Female Preventive care refers to lifestyle choices and visits with your health care provider that can promote health and wellness. What does preventive care include? A yearly physical exam. This is also called an annual well check. Dental exams once or twice a year. Routine eye exams. Ask your health care provider how often you should have your eyes checked. Personal lifestyle choices, including: Daily care of your teeth and gums. Regular physical activity. Eating a healthy diet. Avoiding tobacco and drug use. Limiting alcohol use. Practicing safe sex. Taking low-dose aspirin  every day. Taking vitamin and mineral supplements as recommended by your health care provider. What happens during an annual well check? The services and screenings done by your health care provider during your annual well check will depend on your age, overall health, lifestyle risk factors, and family history of disease. Counseling  Your health care provider may ask you questions about your: Alcohol use. Tobacco use. Drug  use. Emotional well-being. Home and relationship well-being. Sexual activity. Eating habits. History of falls. Memory and ability to understand (cognition). Work and work Astronomer. Reproductive health. Screening  You may have the following tests or measurements: Height, weight, and BMI. Blood pressure. Lipid and cholesterol levels. These may be checked every 5 years, or more frequently if you are over 47 years old. Skin check. Lung cancer screening. You may have this screening every year starting at age 87 if you have a 30-pack-year history of smoking and currently smoke or have quit within the past 15 years. Fecal occult blood test (FOBT) of the stool. You may have this test every year starting at age 33. Flexible sigmoidoscopy or colonoscopy. You may have a sigmoidoscopy every 5 years or a colonoscopy every 10 years starting at age 86. Hepatitis C blood test. Hepatitis B blood test. Sexually transmitted disease (STD) testing. Diabetes screening. This is done by checking your blood sugar (glucose) after you have not eaten for a while (fasting). You may have this done every 1-3 years. Bone density scan. This is done to screen for osteoporosis. You may have this done starting at age 80. Mammogram. This may be done every 1-2 years. Talk to your health care provider about how often you should have regular mammograms. Talk with your health care provider about your test results, treatment options, and if necessary, the need for more tests. Vaccines  Your health care provider may recommend certain vaccines, such as: Influenza vaccine. This is recommended every year. Tetanus, diphtheria, and acellular pertussis (Tdap, Td) vaccine. You may need a Td booster every 10 years. Zoster vaccine. You may need this after age 3. Pneumococcal 13-valent conjugate (PCV13) vaccine. One dose is recommended after  age 45. Pneumococcal polysaccharide (PPSV23) vaccine. One dose is recommended after age  65. Talk to your health care provider about which screenings and vaccines you need and how often you need them. This information is not intended to replace advice given to you by your health care provider. Make sure you discuss any questions you have with your health care provider. Document Released: 04/14/2015 Document Revised: 12/06/2015 Document Reviewed: 01/17/2015 Elsevier Interactive Patient Education  2017 [employer].  Fall Prevention in the Home Falls can cause injuries. They can happen to people of all ages. There are many things you can do to make your home safe and to help prevent falls. What can I do on the outside of my home? Regularly fix the edges of walkways and driveways and fix any cracks. Remove anything that might make you trip as you walk through a door, such as a raised step or threshold. Trim any bushes or trees on the path to your home. Use bright outdoor lighting. Clear any walking paths of anything that might make someone trip, such as rocks or tools. Regularly check to see if handrails are loose or broken. Make sure that both sides of any steps have handrails. Any raised decks and porches should have guardrails on the edges. Have any leaves, snow, or ice cleared regularly. Use sand or salt on walking paths during winter. Clean up any spills in your garage right away. This includes oil or grease spills. What can I do in the bathroom? Use night lights. Install grab bars by the toilet and in the tub and shower. Do not use towel bars as grab bars. Use non-skid mats or decals in the tub or shower. If you need to sit down in the shower, use a plastic, non-slip stool. Keep the floor dry. Clean up any water that spills on the floor as soon as it happens. Remove soap buildup in the tub or shower regularly. Attach bath mats securely with double-sided non-slip rug tape. Do not have throw rugs and other things on the floor that can make you trip. What can I do in the  bedroom? Use night lights. Make sure that you have a light by your bed that is easy to reach. Do not use any sheets or blankets that are too big for your bed. They should not hang down onto the floor. Have a firm chair that has side arms. You can use this for support while you get dressed. Do not have throw rugs and other things on the floor that can make you trip. What can I do in the kitchen? Clean up any spills right away. Avoid walking on wet floors. Keep items that you use a lot in easy-to-reach places. If you need to reach something above you, use a strong step stool that has a grab bar. Keep electrical cords out of the way. Do not use floor polish or wax that makes floors slippery. If you must use wax, use non-skid floor wax. Do not have throw rugs and other things on the floor that can make you trip. What can I do with my stairs? Do not leave any items on the stairs. Make sure that there are handrails on both sides of the stairs and use them. Fix handrails that are broken or loose. Make sure that handrails are as long as the stairways. Check any carpeting to make sure that it is firmly attached to the stairs. Fix any carpet that is loose or worn. Avoid having throw rugs  at the top or bottom of the stairs. If you do have throw rugs, attach them to the floor with carpet tape. Make sure that you have a light switch at the top of the stairs and the bottom of the stairs. If you do not have them, ask someone to add them for you. What else can I do to help prevent falls? Wear shoes that: Do not have high heels. Have rubber bottoms. Are comfortable and fit you well. Are closed at the toe. Do not wear sandals. If you use a stepladder: Make sure that it is fully opened. Do not climb a closed stepladder. Make sure that both sides of the stepladder are locked into place. Ask someone to hold it for you, if possible. Clearly mark and make sure that you can see: Any grab bars or  handrails. First and last steps. Where the edge of each step is. Use tools that help you move around (mobility aids) if they are needed. These include: Canes. Walkers. Scooters. Crutches. Turn on the lights when you go into a dark area. Replace any light bulbs as soon as they burn out. Set up your furniture so you have a clear path. Avoid moving your furniture around. If any of your floors are uneven, fix them. If there are any pets around you, be aware of where they are. Review your medicines with your doctor. Some medicines can make you feel dizzy. This can increase your chance of falling. Ask your doctor what other things that you can do to help prevent falls. This information is not intended to replace advice given to you by your health care provider. Make sure you discuss any questions you have with your health care provider. Document Released: 01/12/2009 Document Revised: 08/24/2015 Document Reviewed: 04/22/2014 Elsevier Interactive Patient Education  2017 [employer].

## 2024-01-12 ENCOUNTER — Ambulatory Visit: Payer: Self-pay | Admitting: Family

## 2024-01-12 LAB — URINE CULTURE
MICRO NUMBER:: 17084981
SPECIMEN QUALITY:: ADEQUATE

## 2024-01-13 ENCOUNTER — Other Ambulatory Visit: Payer: Self-pay

## 2024-01-13 MED ORDER — NITROFURANTOIN MONOHYD MACRO 100 MG PO CAPS
100.0000 mg | ORAL_CAPSULE | Freq: Every day | ORAL | 1 refills | Status: AC
Start: 2024-01-13 — End: ?

## 2024-01-21 ENCOUNTER — Encounter: Payer: Self-pay | Admitting: Sports Medicine

## 2024-01-21 ENCOUNTER — Ambulatory Visit: Payer: Self-pay | Admitting: *Deleted

## 2024-01-21 ENCOUNTER — Ambulatory Visit: Admitting: Sports Medicine

## 2024-01-21 VITALS — BP 116/78 | HR 62 | Temp 97.6°F | Ht 61.0 in | Wt 168.4 lb

## 2024-01-21 DIAGNOSIS — N898 Other specified noninflammatory disorders of vagina: Secondary | ICD-10-CM

## 2024-01-21 DIAGNOSIS — R1030 Lower abdominal pain, unspecified: Secondary | ICD-10-CM

## 2024-01-21 DIAGNOSIS — R300 Dysuria: Secondary | ICD-10-CM | POA: Diagnosis not present

## 2024-01-21 LAB — POCT URINALYSIS DIPSTICK
Bilirubin, UA: NEGATIVE
Glucose, UA: NEGATIVE
Ketones, UA: NEGATIVE
Leukocytes, UA: NEGATIVE
Nitrite, UA: NEGATIVE
Protein, UA: NEGATIVE
Spec Grav, UA: 1.015 (ref 1.010–1.025)
Urobilinogen, UA: 0.2 U/dL
pH, UA: 7.5 (ref 5.0–8.0)

## 2024-01-21 MED ORDER — FLUCONAZOLE 150 MG PO TABS: 150.0000 mg | ORAL_TABLET | Freq: Once | ORAL | 0 refills | Status: AC

## 2024-01-21 NOTE — Telephone Encounter (Signed)
 FYI Only or Action Required?: FYI only for provider.  Patient was last seen in primary care on 01/09/2024 by Ngetich, Roxan BROCKS, NP.  Called Nurse Triage reporting Vaginitis.  Symptoms began several days ago.  Interventions attempted: OTC medications: Monistat.  Symptoms are: gradually improving.  Triage Disposition: See Physician Within 24 Hours  Patient/caregiver understands and will follow disposition?: Yes    Reason for Disposition  MODERATE-SEVERE itching (i.e., interferes with school, work, or sleep)  Answer Assessment - Initial Assessment Questions Additional info: 1) Monistat with some relief.    1. SYMPTOM: What's the main symptom you're concerned about? (e.g., pain, itching, dryness)     itching 2. LOCATION: Where is the  itching located? (e.g., inside/outside, left/right)     vaginal 3. ONSET: When did the  itching  start?     Monday 4. PAIN: Is there any pain? If Yes, ask: How bad is it? (Scale: 1-10; mild, moderate, severe)     resolved 5. ITCHING: Is there any itching? If Yes, ask: How bad is it? (Scale: 1-10; mild, moderate, severe)     mild 6. CAUSE: What do you think is causing the discharge? Have you had the same problem before? What happened then?     Recent antibiotic for uti, nitrofur X 7 days unsure if symptoms have fully resolved 7. OTHER SYMPTOMS: Do you have any other symptoms? (e.g., fever, itching, vaginal bleeding, pain with urination, injury to genital area, vaginal foreign body)     Denies  Protocols used: Vaginal Symptoms-A-AH

## 2024-01-21 NOTE — Telephone Encounter (Signed)
 Call disconnected during transfer. Attempted to contact patient/ daughter back [phone] to review sx of hurting , itching after taking antibiotics, possible yeast sx. LVMTCB [id number].         Copied from CRM #[id number]. Topic: Clinical - Red Word Triage >> Jan 21, 2024  8:43 AM Susanna ORN wrote: Red Word that prompted transfer to Nurse Triage: Patient's daughter, [name] Copa, calling in stating that her mother thinks she has a yeast infection. States she was on an antibiotic. She has no fever but did state she's hurting and itching. This just started Monday. [name] stated that she did go and get her some Monistat.

## 2024-01-21 NOTE — Progress Notes (Signed)
 Careteam: Patient Care Team: Ngetich, Roxan BROCKS, NP as PCP - General (Family Medicine) Verlin Lonni BIRCH, MD as PCP - Cardiology (Cardiology) Gaston Hamilton, MD (Urology) Shlomo Fallow, MD (Dermatology) Leslee Reusing, MD (Ophthalmology) Gaspar Kung, MD (Orthopedic Surgery) lenon  (Rheumatology) Joshua Blamer, MD as Consulting Physician (Dermatology) Avram Lupita BRAVO, MD as Consulting Physician (Gastroenterology) Ethyl Lonni BRAVO, MD (Inactive) as Consulting Physician (Otolaryngology)  PLACE OF SERVICE:  [facility]  Advanced Directive information    Allergies  Allergen Reactions   Alendronate Sodium     upset stomach   Penicillins     Has patient had a PCN reaction causing immediate rash, facial/tongue/throat swelling, SOB or lightheadedness with hypotension: Yes Has patient had a PCN reaction causing severe rash involving mucus membranes or skin necrosis: No Has patient had a PCN reaction that required hospitalization: No Has patient had a PCN reaction occurring within the last 10 years: No If all of the above answers are NO, then may proceed with Cephalosporin use.    Ciprofloxacin      Had headaches; swelling of feet.    Amoxicillin Rash    Has patient had a PCN reaction causing immediate rash, facial/tongue/throat swelling, SOB or lightheadedness with hypotension: yes Has patient had a PCN reaction causing severe rash involving mucus membranes or skin necrosis: no Has patient had a PCN reaction that required hospitalization: no Has patient had PCN reaction within the last 10 years: no  If all of the above answers are NO, then may proceed with Cephalosporin use.  REACTION: unspecified   Metronidazole  Other (See Comments)    Pounding headaches patient says was bad.  With cipro  for diverticulitis rx. Other reaction(s): Other (See Comments) Pounding headaches patient says was bad.  With cipro  for diverticulitis rx.    Chief Complaint  Patient  presents with   Acute Visit    Patient completed antibiotic Monday. Patient daughter Ashley Lawson purchased Monistat for patient regarding the vaginal itching. Daughter stated that her left side was hurting on Sunday. Yesterday her pelvic area bothered her all day , and also had acts of confusion. Patient had no fever.      Discussed the use of AI scribe software for clinical note transcription with the patient, who gave verbal consent to proceed.  History of Present Illness    Ashley Lawson is an 89 year old female with recurrent urinary tract infections who presents with left-sided abdominal pain . She is accompanied by her daughter, who is her primary caregiver.  She recently completed a one-week course of antibiotics for a urinary tract infection, which she finished on Monday. Following the antibiotic treatment, she developed itching and creamy vaginal discharge. Her daughter administered Monistat 3, which provided relief, and she felt better the following morning.  On Sunday night, she experienced pain on the left side of her abdomen, described as intermittent. The pain improved after using the bathroom. She rated the pain as a 5 or 6 out of 10, with a peak of 7 the previous day. She also reported soreness in the area.  She experienced a decreased appetite, eating only a few bites of food on Tuesday due to feeling unwell, but her appetite improved the following day. No nausea, vomiting, diarrhea, or fever. Denies  blood in the urine but reports   urinary frequency, although she sometimes feels the need to return to the bathroom shortly after urinating.  She uses Premarin cream for vaginal dryness, but her daughter noted she may be using  it more frequently than prescribed.  Review of Systems:  Review of Systems  Constitutional:  Negative for chills and fever.  HENT:  Negative for congestion and sore throat.   Respiratory:  Negative for cough, sputum production and shortness of breath.    Cardiovascular:  Negative for chest pain, palpitations and leg swelling.  Gastrointestinal:  Positive for abdominal pain. Negative for heartburn and nausea.  Genitourinary:  Positive for frequency and urgency. Negative for dysuria and hematuria.  Musculoskeletal:  Negative for falls and myalgias.  Neurological:  Negative for dizziness.   Negative unless indicated in HPI.   Past Medical History:  Diagnosis Date   Abdominal pain 12/2009   hospitalized   Allergic rhinitis    Anxiety    Bladder problem    Chronic constipation    Compression fx, lumbar spine (HCC)    DDD (degenerative disc disease), lumbar    Diverticulosis of colon    Dog bite(E906.0) 08/30/2011   GERD (gastroesophageal reflux disease)    Hepatic steatosis    pt denies   Hyperlipidemia    Hyperplastic colon polyp    IBS (irritable bowel syndrome)    constipation predominant   Internal hemorrhoids    Longstanding persistent atrial fibrillation (HCC)    Mitral regurgitation    Osteoporosis    dexa 2011 -2.7 hip nl spine   PMR (polymyalgia rheumatica) 08/30/2011   under rheum care  on low dose pred 5 mg    Renal disorder    Syncope    Tricuspid regurgitation    Past Surgical History:  Procedure Laterality Date   ABDOMINAL HYSTERECTOMY     with bladder tact and rectocele repair   BLADDER SURGERY     Streched   CATARACT EXTRACTION  2010   both   COLONOSCOPY  2016 was most recent   CYSTO WITH HYDRODISTENSION N/A 10/05/2019   Procedure: CYSTOSCOPY/HYDRODISTENSION;  Surgeon: Gaston Hamilton, MD;  Location: [facility];  Service: Urology;  Laterality: N/A;   TONSILLECTOMY AND ADENOIDECTOMY     Social History:   reports that she has never smoked. She has never used smokeless tobacco. She reports that she does not currently use alcohol. She reports that she does not use drugs.  Family History  Problem Relation Age of Onset   Stroke Mother 8   Heart attack Father 57   Alzheimer's disease Sister 37   Bladder  Cancer Sister 48   Liver disease Brother 59   Crohn's disease Son    Colon cancer Neg Hx    Esophageal cancer Neg Hx    Stomach cancer Neg Hx    Rectal cancer Neg Hx    Liver cancer Neg Hx     Medications: Patient's Medications  New Prescriptions   No medications on file  Previous Medications   ACETAMINOPHEN  (TYLENOL ) 500 MG TABLET    Take 500 mg by mouth at bedtime.   ALPRAZOLAM  (XANAX ) 0.25 MG TABLET    Take 0.25 mg by mouth at bedtime.   APIXABAN  (ELIQUIS ) 5 MG TABS TABLET    Take 1 tablet (5 mg total) by mouth 2 (two) times daily.   ASCORBIC ACID (VITAMIN C) 100 MG TABLET    Take 500 mg by mouth daily.   CALCIUM  CARBONATE (OS-CAL) 1250 (500 CA) MG CHEWABLE TABLET    Chew 1 tablet (1,250 mg total) by mouth as needed.   DILTIAZEM  (CARDIZEM  CD) 120 MG 24 HR CAPSULE    Take 1 capsule (120 mg total) by mouth daily.  ESCITALOPRAM  (LEXAPRO ) 10 MG TABLET    TAKE 1 TABLET BY MOUTH EVERYDAY AT BEDTIME   FLUTICASONE  (FLONASE ) 50 MCG/ACT NASAL SPRAY    INHALE 2 SPRAYS IN EACH NOSTRIL DAILY X 1 WEEK, THEN 1-2 SPRAYS DAILY. USE LOWEST POSSIBLE DOSE AFTER 1 WEEK   GUAIFENESIN  (MUCINEX ) 600 MG 12 HR TABLET    Take 600 mg by mouth daily as needed.   LORATADINE  (CLARITIN ) 10 MG TABLET    TAKE 1 TABLET BY MOUTH EVERY DAY   MULTIPLE VITAMIN (MULTIVITAMIN) TABLET    Take 1 tablet by mouth daily.   NITROFURANTOIN , MACROCRYSTAL-MONOHYDRATE, (MACROBID ) 100 MG CAPSULE    Take 1 capsule (100 mg total) by mouth daily.   POLYETHYLENE GLYCOL POWDER (GLYCOLAX /MIRALAX ) 17 GM/SCOOP POWDER    Take 17 g by mouth daily. Hold for loose stool   PREMARIN VAGINAL CREAM    Place 1 applicator vaginally as needed.   PROBIOTIC PRODUCT (ALIGN) 4 MG CAPS    Take 1 capsule by mouth as needed.   ROSUVASTATIN  (CRESTOR ) 5 MG TABLET    TAKE 1 TABLET (5 MG TOTAL) BY MOUTH DAILY.   XIIDRA 5 % SOLN    Apply 1 drop to eye as needed (When daugther reminds her).  Modified Medications   No medications on file  Discontinued  Medications   No medications on file    Physical Exam: Vitals:   01/21/24 1442  BP: 116/78  Pulse: 62  Temp: 97.6 F (36.4 C)  TempSrc: Temporal  SpO2: 94%  Weight: 168 lb 6.4 oz (76.4 kg)  Height: 5' 1 (1.549 m)   Body mass index is 31.82 kg/m. BP Readings from Last 3 Encounters:  01/21/24 116/78  01/09/24 108/62  12/11/23 (!) 143/84   Wt Readings from Last 3 Encounters:  01/21/24 168 lb 6.4 oz (76.4 kg)  01/09/24 166 lb 9.6 oz (75.6 kg)  12/09/23 163 lb 9.6 oz (74.2 kg)    Physical Exam Constitutional:      Appearance: Normal appearance.  HENT:     Head: Normocephalic and atraumatic.  Cardiovascular:     Rate and Rhythm: Normal rate and regular rhythm.  Pulmonary:     Effort: Pulmonary effort is normal. No respiratory distress.     Breath sounds: Normal breath sounds. No wheezing.  Abdominal:     General: Bowel sounds are normal. There is no distension.     Tenderness: There is no abdominal tenderness. There is no guarding or rebound.     Comments:    Musculoskeletal:        General: No swelling.  Neurological:     Mental Status: She is alert. Mental status is at baseline.     Motor: No weakness.     Labs reviewed: Basic Metabolic Panel: Recent Labs    02/12/23 0932 08/22/23 0901 12/11/23 1949  NA 141 139 134*  K 4.9 4.4 4.4  CL 106 104 100  CO2 28 27 23   GLUCOSE 86 108* 101*  BUN 11 14 15   CREATININE 0.92 0.87 1.00  CALCIUM  9.1 9.1 9.3  TSH 4.11 4.93*  --    Liver Function Tests: Recent Labs    02/12/23 0932 08/22/23 0901 12/11/23 1949  AST 18 18 23   ALT 14 12 13   ALKPHOS  --   --  57  BILITOT 0.7 0.8 0.7  PROT 6.6 6.8 6.1*  ALBUMIN  --   --  3.7   No results for input(s): LIPASE, AMYLASE in the last 8760 hours.  No results for input(s): AMMONIA in the last 8760 hours. CBC: Recent Labs    02/12/23 0932 08/22/23 0901 12/11/23 1949  WBC 6.6 5.8 8.1  NEUTROABS 4,046 3,149 4.7  HGB 12.3 12.2 12.3  HCT 38.4 39.0 38.7   MCV 98.0 99.0 94.9  PLT 233 208 185   Lipid Panel: Recent Labs    02/12/23 0932 08/22/23 0901  CHOL 149 120  HDL 55 59  LDLCALC 73 41  TRIG 119 112  CHOLHDL 2.7 2.0   TSH: Recent Labs    02/12/23 0932 08/22/23 0901  TSH 4.11 4.93*   A1C: Lab Results  Component Value Date   HGBA1C 5.9 (H) 08/22/2023    Assessment and Plan Assessment & Plan   1. Lower abdominal pain   Urine dipstick positive Pt states her symptoms are improving C/o mild lower abdominal pain Urinary frequency + - Culture, Urine - fluconazole (DIFLUCAN) 150 MG tablet; Take 1 tablet (150 mg total) by mouth once for 1 dose.  Dispense: 1 tablet; Refill: 0  2. Vaginal discharge  Reports whitish discharge and vaginal itching Will send diflucan to her pharmacy - fluconazole (DIFLUCAN) 150 MG tablet; Take 1 tablet (150 mg total) by mouth once for 1 dose.  Dispense: 1 tablet; Refill: 0  Other orders - ALPRAZolam  (XANAX ) 0.25 MG tablet; Take 0.25 mg by mouth at bedtime.

## 2024-01-22 LAB — URINE CULTURE
MICRO NUMBER:: 17133635
SPECIMEN QUALITY:: ADEQUATE

## 2024-01-23 ENCOUNTER — Ambulatory Visit: Payer: Self-pay

## 2024-01-23 NOTE — Telephone Encounter (Signed)
 FYI Only or Action Required?: FYI only for provider.  Patient was last seen in primary care on 01/21/2024 by Sherlynn Madden, MD.  Called Nurse Triage reporting Hip Pain.  Symptoms began a week ago.  Interventions attempted: OTC medications: monistat.  Symptoms are: unchanged.  Triage Disposition: See Physician Within 24 Hours  Patient/caregiver understands and will follow disposition?: Unsure        Copied from CRM [id number]. Topic: Clinical - Red Word Triage >> Jan 23, 2024  3:09 PM Mercer PEDLAR wrote: Red Word that prompted transfer to Nurse Triage: Soreness on left side of body around hip area. Reason for Disposition  [1] MILD to MODERATE vaginal pain AND [2] present > 24 hours  (Exception: Chronic pain.)    Triage was limited d/t pt wanting to know if PCP office is open tomorrow. Triager advised that PCP office is closed on the weekends. Triager also advised [facility] on [address].  Answer Assessment - Initial Assessment Questions 1. SYMPTOM: What's the main symptom you're concerned about? (e.g., pain, itching, dryness)     L side 2. LOCATION: Where is the  sx located? (e.g., inside/outside, left/right)     outside 3. ONSET: When did the  *No Answer*  start?     *No Answer* 4. PAIN: Is there any pain? If Yes, ask: How bad is it? (Scale: 1-10; mild, moderate, severe)     Hurts like the devil 5. ITCHING: Is there any itching? If Yes, ask: How bad is it? (Scale: 1-10; mild, moderate, severe)     denies 6. CAUSE: What do you think is causing the discharge? Have you had the same problem before? What happened then?     *No Answer* 7. OTHER SYMPTOMS: Do you have any other symptoms? (e.g., fever, itching, vaginal bleeding, pain with urination, injury to genital area, vaginal foreign body)     denies 8. PREGNANCY: Is there any chance you are pregnant? When was your last menstrual period?     *No Answer*  Answer Assessment - Initial Assessment  Questions 1. LOCATION and RADIATION: Where is the pain located? Does the pain spread (shoot) anywhere else?     L bottom 2. QUALITY: What does the pain feel like?  (e.g., sharp, dull, aching, burning)     *No Answer* 3. SEVERITY: How bad is the pain? What does it keep you from doing?   (Scale 1-10; or mild, moderate, severe)     *No Answer* 4. ONSET: When did the pain start? Does it come and go, or is it there all the time?     *No Answer* 5. WORK OR EXERCISE: Has there been any recent work or exercise that involved this part of the body?      *No Answer* 6. CAUSE: What do you think is causing the hip pain?      *No Answer* 7. AGGRAVATING FACTORS: What makes the hip pain worse? (e.g., walking, climbing stairs, running)     *No Answer* 8. OTHER SYMPTOMS: Do you have any other symptoms? (e.g., back pain, pain shooting down leg,  fever, rash)     *No Answer*  Protocols used: Hip Pain-A-AH, Vaginal Symptoms-A-AH

## 2024-01-26 ENCOUNTER — Ambulatory Visit: Payer: Self-pay

## 2024-01-26 NOTE — Telephone Encounter (Signed)
 FYI Only or Action Required?: Action required by provider: clinical question for provider and update on patient condition.  Patient was last seen in primary care on 01/21/2024 by Sherlynn Madden, MD.  Called Nurse Triage reporting Vaginitis and Dysuria.  Symptoms began several days ago.  Interventions attempted: OTC medications: AZO and Rest, hydration, or home remedies.  Symptoms are: gradually worsening.  Triage Disposition: Call PCP When Office is Open (overriding See PCP When Office is Open (Within 3 Days))  Patient/caregiver understands and will follow disposition?: Yes                            Copied from CRM [id number]. Topic: Clinical - Red Word Triage >> Jan 26, 2024  9:44 AM Antonio DEL wrote: Red Word that prompted transfer to Nurse Triage: Patient was seen last week for lower abdomen pain, called for culture results. patient stated she is still having pain down there. Reason for Disposition  [1] Vaginal itching AND [2] not improved > 3 days following Care Advice  Answer Assessment - Initial Assessment Questions Patient was evaluated in office for symptoms on 01/21/24. Patient's labs have not yet been reviewed by a provider. Patient stated her symptoms are not improving and she would like to know how proceed. Patient would like a call back at [phone]. Please advise.   1. SYMPTOM: What's the main symptom you're concerned about? (e.g., pain, itching, dryness)     Vaginal dryness/tightness 2. LOCATION: Where is the symptoms located? (e.g., inside/outside, left/right)     Inside of vagina 3. ONSET: When did the symptoms start?     3-4 days ago, OV on 01/21/24 4. PAIN: Is there any pain? If Yes, ask: How bad is it? (Scale: 1-10; mild, moderate, severe)     Moderate, intermittent pain across left side of abdomin 5. ITCHING: Is there any itching? If Yes, ask: How bad is it? (Scale: 1-10; mild, moderate, severe)     Mild 6. CAUSE:  What do you think is causing the discharge? Have you had the same problem before? What happened then?     Unsure, OV on 01/21/24 7. OTHER SYMPTOMS: Do you have any other symptoms? (e.g., fever, itching, vaginal bleeding, pain with urination, injury to genital area, vaginal foreign body)     LLQ abdominal discomfort, urinary frequency, denies discharge, denies fever, denies vomiting, denies painful urination 8. PREGNANCY: Is there any chance you are pregnant? When was your last menstrual period?     N/A  Protocols used: Vaginal Symptoms-A-AH

## 2024-01-26 NOTE — Telephone Encounter (Signed)
 Ashley Madden, MD to [facility]  (Selected Message)     01/26/24 10:26 AM If no improvement, pt might need per vaginal exam ,needs to be seen, schedule with PCP   I left a detailed message with the above response.

## 2024-01-26 NOTE — Telephone Encounter (Signed)
 Seen Dr.Veludandi on 01/21/24

## 2024-01-26 NOTE — Telephone Encounter (Signed)
 Please schedule appointment to evaluate hip pain.

## 2024-01-27 NOTE — Telephone Encounter (Signed)
 I left a detail voicemail to have the patient to call the office to schedule an appointment to come into the office to be evaluated for hip pain

## 2024-01-28 ENCOUNTER — Other Ambulatory Visit: Payer: Self-pay | Admitting: Family

## 2024-01-28 DIAGNOSIS — R0982 Postnasal drip: Secondary | ICD-10-CM

## 2024-01-29 DIAGNOSIS — N390 Urinary tract infection, site not specified: Secondary | ICD-10-CM | POA: Diagnosis not present

## 2024-01-29 DIAGNOSIS — N3281 Overactive bladder: Secondary | ICD-10-CM | POA: Diagnosis not present

## 2024-01-29 DIAGNOSIS — R350 Frequency of micturition: Secondary | ICD-10-CM | POA: Diagnosis not present

## 2024-02-03 ENCOUNTER — Other Ambulatory Visit: Payer: Self-pay | Admitting: Cardiovascular Disease

## 2024-02-19 ENCOUNTER — Other Ambulatory Visit: Payer: Self-pay | Admitting: Cardiovascular Disease

## 2024-02-25 ENCOUNTER — Other Ambulatory Visit: Payer: Self-pay

## 2024-02-25 DIAGNOSIS — E785 Hyperlipidemia, unspecified: Secondary | ICD-10-CM

## 2024-02-25 DIAGNOSIS — N1831 Chronic kidney disease, stage 3a: Secondary | ICD-10-CM

## 2024-02-25 DIAGNOSIS — I480 Paroxysmal atrial fibrillation: Secondary | ICD-10-CM

## 2024-02-25 LAB — CBC WITH DIFFERENTIAL/PLATELET
Absolute Lymphocytes: 1637 {cells}/uL (ref 850–3900)
Absolute Monocytes: 639 {cells}/uL (ref 200–950)
Basophils Absolute: 62 {cells}/uL (ref 0–200)
Basophils Relative: 1 %
Eosinophils Absolute: 143 {cells}/uL (ref 15–500)
Eosinophils Relative: 2.3 %
HCT: 37.5 % (ref 35.9–46.0)
Hemoglobin: 12.2 g/dL (ref 11.7–15.5)
MCH: 31.9 pg (ref 27.0–33.0)
MCHC: 32.5 g/dL (ref 31.6–35.4)
MCV: 97.9 fL (ref 81.4–101.7)
MPV: 9.2 fL (ref 7.5–12.5)
Monocytes Relative: 10.3 %
Neutro Abs: 3720 {cells}/uL (ref 1500–7800)
Neutrophils Relative %: 60 %
Platelets: 218 Thousand/uL (ref 140–400)
RBC: 3.83 Million/uL (ref 3.80–5.10)
RDW: 13.2 % (ref 11.0–15.0)
Total Lymphocyte: 26.4 %
WBC: 6.2 Thousand/uL (ref 3.8–10.8)

## 2024-02-25 LAB — COMPLETE METABOLIC PANEL WITHOUT GFR
AG Ratio: 1.8 (calc) (ref 1.0–2.5)
ALT: 11 U/L (ref 6–29)
AST: 20 U/L (ref 10–35)
Albumin: 4.3 g/dL (ref 3.6–5.1)
Alkaline phosphatase (APISO): 60 U/L (ref 37–153)
BUN: 16 mg/dL (ref 7–25)
CO2: 27 mmol/L (ref 20–32)
Calcium: 9.5 mg/dL (ref 8.6–10.4)
Chloride: 102 mmol/L (ref 98–110)
Creat: 0.87 mg/dL (ref 0.60–0.95)
Globulin: 2.4 g/dL (ref 1.9–3.7)
Glucose, Bld: 98 mg/dL (ref 65–99)
Potassium: 4.9 mmol/L (ref 3.5–5.3)
Sodium: 138 mmol/L (ref 135–146)
Total Bilirubin: 0.9 mg/dL (ref 0.2–1.2)
Total Protein: 6.7 g/dL (ref 6.1–8.1)

## 2024-02-25 LAB — LIPID PANEL
Cholesterol: 117 mg/dL (ref <200)
HDL: 66 mg/dL (ref >=50)
LDL Cholesterol (Calc): 33 mg/dL
Non-HDL Cholesterol (Calc): 51 mg/dL (ref <130)
Total CHOL/HDL Ratio: 1.8 (calc) (ref <5.0)
Triglycerides: 92 mg/dL (ref <150)

## 2024-02-25 LAB — TSH: TSH: 4.78 m[IU]/L — ABNORMAL HIGH (ref 0.40–4.50)

## 2024-03-01 ENCOUNTER — Ambulatory Visit: Payer: Self-pay | Admitting: Family

## 2024-03-01 ENCOUNTER — Encounter: Payer: Self-pay | Admitting: Family

## 2024-03-01 VITALS — BP 126/74 | HR 67 | Temp 97.7°F | Resp 18 | Ht 61.0 in | Wt 166.6 lb

## 2024-03-01 DIAGNOSIS — I480 Paroxysmal atrial fibrillation: Secondary | ICD-10-CM | POA: Diagnosis not present

## 2024-03-01 DIAGNOSIS — E785 Hyperlipidemia, unspecified: Secondary | ICD-10-CM

## 2024-03-01 DIAGNOSIS — F411 Generalized anxiety disorder: Secondary | ICD-10-CM

## 2024-03-01 DIAGNOSIS — R350 Frequency of micturition: Secondary | ICD-10-CM | POA: Diagnosis not present

## 2024-03-01 DIAGNOSIS — N1831 Chronic kidney disease, stage 3a: Secondary | ICD-10-CM

## 2024-03-01 LAB — POCT URINALYSIS DIPSTICK OB
Bilirubin, UA: NEGATIVE
Glucose, UA: NEGATIVE
Ketones, UA: NEGATIVE
Leukocytes, UA: NEGATIVE
Nitrite, UA: NEGATIVE
Spec Grav, UA: 1.015 (ref 1.010–1.025)
Urobilinogen, UA: 0.2 U/dL
pH, UA: 8 (ref 5.0–8.0)

## 2024-03-01 NOTE — Progress Notes (Signed)
 Provider: Roxan Plough FNP-C   Caitland Porchia, Roxan BROCKS, NP  Patient Care Team: Neysha Criado, Roxan BROCKS, NP as PCP - General (Family Medicine) Verlin Lonni BIRCH, MD as PCP - Cardiology (Cardiology) Gaston Hamilton, MD (Urology) Shlomo Fallow, MD (Dermatology) Leslee Reusing, MD (Ophthalmology) Gaspar Kung, MD (Orthopedic Surgery) lenon  (Rheumatology) Joshua Blamer, MD as Consulting Physician (Dermatology) Avram Lupita BRAVO, MD as Consulting Physician (Gastroenterology) Ethyl Lonni BRAVO, MD (Inactive) as Consulting Physician (Otolaryngology)  Extended Emergency Contact Information Primary Emergency Contact: Apple,Karen Address: [address]          [place] [address] [place] Home Phone: [phone] Mobile Phone: [phone] Relation: Daughter  Code Status:  Full Code  Goals of care: Advanced Directive information    03/01/2024    1:48 PM  Advanced Directives  Does Patient Have a Medical Advance Directive? Yes  Type of Advance Directive Healthcare Power of Attorney  Does patient want to make changes to medical advance directive? No - Patient declined  Copy of Healthcare Power of Attorney in Chart? Yes - validated most recent copy scanned in chart (See row information)     Chief Complaint  Patient presents with   Medical Management of Chronic Issues    6 Month follow up.     Discussed the use of AI scribe software for clinical note transcription with the patient, who gave verbal consent to proceed.  History of Present Illness   Ashley Lawson is an 89 year old female who presents for a six-month follow-up appointment.  She has a history of recurrent urinary tract infections and completed a 60-day low-dose antibiotic regimen Nitrofurantoin  prescribed by her urologist. Since then, she has not experienced any bladder issues, including pain or discomfort.  She has a history of atrial fibrillation and is currently taking Eliquis  5 mg twice  daily and diltiazem . She has not noticed any irregular heartbeats recently. An appointment with a cardiologist was scheduled but has been postponed to February unless there is a cancellation.  She experiences anxiety but reports feeling stable, even during the holidays. She is currently taking alprazolam  0.25 mg at bedtime and escitalopram  10 mg daily.  She recalls a fall in September when she fell into the shower door, but there have been no falls since then.  Her current medications include Tylenol  at bedtime, alprazolam  0.25 mg at bedtime, Eliquis  5 mg twice daily, vitamin C 500 mg daily, escitalopram  10 mg daily, Flonase  for nasal congestion, Mucinex  and Claritin  as needed, a multivitamin, nitrofurantoin  100 mg daily for urinary tract infection prevention, Miralax  as needed for constipation, Premarin vaginal cream as needed, rosuvastatin  5 mg daily, and Xiidra eye drops as needed. She reports not using her eye drops regularly and not taking Miralax  daily as she is not currently experiencing constipation.  She has a history of arthritis and experiences stiffness in her fingers. She lives with her daughter, who assists with medication management and daily activities. Her daughter works part-time on Tuesdays and Thursdays, and she has a comptroller who assists with care when needed.   Past Medical History:  Diagnosis Date   Abdominal pain 12/2009   hospitalized   Allergic rhinitis    Anxiety    Bladder problem    Chronic constipation    Compression fx, lumbar spine (HCC)    DDD (degenerative disc disease), lumbar    Diverticulosis of colon    Dog bite(E906.0) 08/30/2011   GERD (gastroesophageal reflux disease)    Hepatic steatosis  pt denies   Hyperlipidemia    Hyperplastic colon polyp    IBS (irritable bowel syndrome)    constipation predominant   Internal hemorrhoids    Longstanding persistent atrial fibrillation (HCC)    Mitral regurgitation    Osteoporosis    dexa 2011 -2.7 hip nl  spine   PMR (polymyalgia rheumatica) 08/30/2011   under rheum care  on low dose pred 5 mg    Renal disorder    Syncope    Tricuspid regurgitation    Past Surgical History:  Procedure Laterality Date   ABDOMINAL HYSTERECTOMY     with bladder tact and rectocele repair   BLADDER SURGERY     Streched   CATARACT EXTRACTION  2010   both   COLONOSCOPY  2016 was most recent   CYSTO WITH HYDRODISTENSION N/A 10/05/2019   Procedure: CYSTOSCOPY/HYDRODISTENSION;  Surgeon: Gaston Hamilton, MD;  Location: [facility];  Service: Urology;  Laterality: N/A;   TONSILLECTOMY AND ADENOIDECTOMY      Allergies  Allergen Reactions   Alendronate Sodium     upset stomach   Penicillins     Has patient had a PCN reaction causing immediate rash, facial/tongue/throat swelling, SOB or lightheadedness with hypotension: Yes Has patient had a PCN reaction causing severe rash involving mucus membranes or skin necrosis: No Has patient had a PCN reaction that required hospitalization: No Has patient had a PCN reaction occurring within the last 10 years: No If all of the above answers are NO, then may proceed with Cephalosporin use.    Ciprofloxacin      Had headaches; swelling of feet.    Amoxicillin Rash    Has patient had a PCN reaction causing immediate rash, facial/tongue/throat swelling, SOB or lightheadedness with hypotension: yes Has patient had a PCN reaction causing severe rash involving mucus membranes or skin necrosis: no Has patient had a PCN reaction that required hospitalization: no Has patient had PCN reaction within the last 10 years: no  If all of the above answers are NO, then may proceed with Cephalosporin use.  REACTION: unspecified   Metronidazole  Other (See Comments)    Pounding headaches patient says was bad.  With cipro  for diverticulitis rx. Other reaction(s): Other (See Comments) Pounding headaches patient says was bad.  With cipro  for diverticulitis rx.    Allergies as of  03/01/2024       Reactions   Alendronate Sodium    upset stomach   Penicillins    Has patient had a PCN reaction causing immediate rash, facial/tongue/throat swelling, SOB or lightheadedness with hypotension: Yes Has patient had a PCN reaction causing severe rash involving mucus membranes or skin necrosis: No Has patient had a PCN reaction that required hospitalization: No Has patient had a PCN reaction occurring within the last 10 years: No If all of the above answers are NO, then may proceed with Cephalosporin use.   Ciprofloxacin     Had headaches; swelling of feet.    Amoxicillin Rash   Has patient had a PCN reaction causing immediate rash, facial/tongue/throat swelling, SOB or lightheadedness with hypotension: yes Has patient had a PCN reaction causing severe rash involving mucus membranes or skin necrosis: no Has patient had a PCN reaction that required hospitalization: no Has patient had PCN reaction within the last 10 years: no  If all of the above answers are NO, then may proceed with Cephalosporin use. REACTION: unspecified   Metronidazole  Other (See Comments)   Pounding headaches patient says was bad.  With cipro   for diverticulitis rx. Other reaction(s): Other (See Comments) Pounding headaches patient says was bad.  With cipro  for diverticulitis rx.        Medication List        Accurate as of March 01, 2024  4:01 PM. If you have any questions, ask your nurse or doctor.          STOP taking these medications    azithromycin  250 MG tablet Commonly known as: ZITHROMAX  Stopped by: Danyiel Crespin C Baylynn Shifflett   calcium  carbonate 1250 (500 Ca) MG chewable tablet Commonly known as: OS-CAL Stopped by: Melanye Hiraldo C Matson Welch       TAKE these medications    acetaminophen  500 MG tablet Commonly known as: TYLENOL  Take 500 mg by mouth at bedtime.   Align 4 MG Caps Take 1 capsule by mouth as needed.   ALPRAZolam  0.25 MG tablet Commonly known as: XANAX  Take 0.25 mg by  mouth at bedtime.   apixaban  5 MG Tabs tablet Commonly known as: Eliquis  Take 1 tablet (5 mg total) by mouth 2 (two) times daily.   diltiazem  120 MG 24 hr capsule Commonly known as: CARDIZEM  CD TAKE 1 CAPSULE BY MOUTH EVERY DAY   escitalopram  10 MG tablet Commonly known as: LEXAPRO  TAKE 1 TABLET BY MOUTH EVERYDAY AT BEDTIME   fluticasone  50 MCG/ACT nasal spray Commonly known as: FLONASE  INHALE 2 SPRAYS IN EACH NOSTRIL DAILY X 1 WEEK, THEN 1-2 SPRAYS DAILY. USE LOWEST POSSIBLE DOSE AFTER 1 WEEK   guaiFENesin  600 MG 12 hr tablet Commonly known as: MUCINEX  Take 600 mg by mouth daily as needed.   loratadine  10 MG tablet Commonly known as: CLARITIN  TAKE 1 TABLET BY MOUTH EVERY DAY   multivitamin tablet Take 1 tablet by mouth daily. What changed:  when to take this reasons to take this   nitrofurantoin  (macrocrystal-monohydrate) 100 MG capsule Commonly known as: Macrobid  Take 1 capsule (100 mg total) by mouth daily.   polyethylene glycol powder 17 GM/SCOOP powder Commonly known as: GLYCOLAX /MIRALAX  Take 17 g by mouth daily. Hold for loose stool   Premarin vaginal cream Generic drug: conjugated estrogens Place 1 applicator vaginally as needed.   rosuvastatin  5 MG tablet Commonly known as: CRESTOR  TAKE 1 TABLET (5 MG TOTAL) BY MOUTH DAILY.   vitamin C 100 MG tablet Take 500 mg by mouth daily.   Xiidra 5 % Soln Generic drug: Lifitegrast Apply 1 drop to eye as needed (When daugther reminds her).        Review of Systems  Constitutional:  Negative for appetite change, chills, fatigue, fever and unexpected weight change.  HENT:  Negative for congestion, dental problem, ear discharge, ear pain, hearing loss, nosebleeds, postnasal drip, rhinorrhea, sinus pressure, sinus pain, sneezing, sore throat, tinnitus and trouble swallowing.   Eyes:  Negative for pain, discharge, redness, itching and visual disturbance.  Respiratory:  Negative for cough, chest tightness,  shortness of breath and wheezing.   Cardiovascular:  Negative for chest pain, palpitations and leg swelling.  Gastrointestinal:  Negative for abdominal distention, abdominal pain, blood in stool, constipation, diarrhea, nausea and vomiting.  Endocrine: Negative for cold intolerance, heat intolerance, polydipsia, polyphagia and polyuria.  Genitourinary:  Negative for difficulty urinating, dysuria, flank pain and urgency.       Urine frequency has improved since starting on Nitrofurantoin    Musculoskeletal:  Negative for arthralgias, back pain, gait problem, joint swelling, myalgias, neck pain and neck stiffness.  Skin:  Negative for color change, pallor, rash and wound.  Neurological:  Negative for dizziness, syncope, speech difficulty, weakness, light-headedness, numbness and headaches.  Hematological:  Does not bruise/bleed easily.  Psychiatric/Behavioral:  Negative for agitation, behavioral problems, confusion, hallucinations, self-injury, sleep disturbance and suicidal ideas. The patient is nervous/anxious.     Immunization History  Administered Date(s) Administered   Fluad Quad(high Dose 65+) 01/10/2020, 12/10/2021   Fluad Trivalent(High Dose 65+) 01/06/2023   INFLUENZA, HIGH DOSE SEASONAL PF 01/19/2013, 01/10/2014, 01/17/2015, 01/18/2016, 01/02/2017, 01/05/2018, 01/05/2019, 01/05/2019, 02/07/2021, 01/09/2024   Influenza Split 01/25/2011, 01/23/2012   Influenza Whole 12/31/2007   Influenza-Unspecified 01/02/2017   Moderna Sars-Covid-2 Vaccination 02/15/2022   PFIZER(Purple Top)SARS-COV-2 Vaccination 05/07/2019, 06/02/2019, 01/31/2020, 08/02/2020   Pfizer Covid-19 Vaccine Bivalent Booster 85yrs & up 02/19/2021   Pfizer(Comirnaty)Fall Seasonal Vaccine 12 years and older 01/12/2024   Pneumococcal Conjugate-13 02/23/2013   Pneumococcal Polysaccharide-23 04/01/2006, 04/01/2018   Td 04/01/2005   Td (Adult) 11/22/2016   Tdap 08/30/2011, 08/03/2021   Zoster Recombinant(Shingrix) 12/04/2016,  07/27/2017   Zoster, Live 04/02/2007   Pertinent  Health Maintenance Due  Topic Date Due   Mammogram  09/09/2022   Influenza Vaccine  Completed   Bone Density Scan  Completed      08/27/2023    1:02 PM 09/15/2023   12:24 PM 11/21/2023   11:01 AM 12/09/2023    9:27 AM 03/01/2024    1:47 PM  Fall Risk  Falls in the past year? 1 0 1 1 0  Was there an injury with Fall? 0  0  1  1  0   Fall Risk Category Calculator 2 0 3 2 0  Patient at Risk for Falls Due to History of fall(s)  History of fall(s)  No Fall Risks  Fall risk Follow up Falls evaluation completed Falls evaluation completed   Falls evaluation completed     Data saved with a previous flowsheet row definition   Functional Status Survey:    Vitals:   03/01/24 1351  BP: 126/74  Pulse: 67  Resp: 18  Temp: 97.7 F (36.5 C)  SpO2: 96%  Weight: 166 lb 9.6 oz (75.6 kg)  Height: 5' 1 (1.549 m)   Body mass index is 31.48 kg/m. Physical Exam GENERAL: Alert, cooperative, well developed, no acute distress HEENT: Normocephalic, normal oropharynx, moist mucous membranes, ears normal, nose normal, no sinus tenderness CHEST: Clear to auscultation bilaterally, no wheezes, rhonchi, or crackles CARDIOVASCULAR: Normal heart rate and rhythm, S1 and S2 normal without murmurs ABDOMEN: Soft, non-tender, non-distended, without organomegaly, normal bowel sounds EXTREMITIES: No cyanosis or edema MUSCULOSKELETAL: Hips normal range of motion, no back tenderness NEUROLOGICAL: Cranial nerves grossly intact, moves all extremities without gross motor or sensory deficit  SKIN: No rash,no lesion or erythema   PSYCHIATRY/BEHAVIORAL: Mood stable   Labs reviewed: Recent Labs    08/22/23 0901 12/11/23 1949 02/25/24 0857  NA 139 134* 138  K 4.4 4.4 4.9  CL 104 100 102  CO2 27 23 27   GLUCOSE 108* 101* 98  BUN 14 15 16   CREATININE 0.87 1.00 0.87  CALCIUM  9.1 9.3 9.5   Recent Labs    08/22/23 0901 12/11/23 1949 02/25/24 0857  AST 18 23  20   ALT 12 13 11   ALKPHOS  --  57  --   BILITOT 0.8 0.7 0.9  PROT 6.8 6.1* 6.7  ALBUMIN  --  3.7  --    Recent Labs    08/22/23 0901 12/11/23 1949 02/25/24 0857  WBC 5.8 8.1 6.2  NEUTROABS 3,149 4.7 3,720  HGB 12.2 12.3  12.2  HCT 39.0 38.7 37.5  MCV 99.0 94.9 97.9  PLT 208 185 218   Lab Results  Component Value Date   TSH 4.78 (H) 02/25/2024   Lab Results  Component Value Date   HGBA1C 5.9 (H) 08/22/2023   Lab Results  Component Value Date   CHOL 117 02/25/2024   HDL 66 02/25/2024   LDLCALC 33 02/25/2024   LDLDIRECT 100.0 04/07/2019   TRIG 92 02/25/2024   CHOLHDL 1.8 02/25/2024    Significant Diagnostic Results in last 30 days:  No results found.  Assessment/Plan  Adult Wellness Visit Routine six-month follow-up visit with no new issues. Immunizations are up to date. Recent lab work shows stable hemoglobin, normal kidney function, improved glucose levels, and normal liver function. Thyroid  levels slightly elevated but not concerning. Cholesterol levels are well-controlled. No signs of infection or anemia. - Scheduled six-month follow-up appointment - Ordered lab work prior to next visit - Encouraged regular exercise and healthy diet  Paroxysmal atrial fibrillation No recent episodes of irregular heartbeat. Follow-up with cardiologist is scheduled for February unless a cancellation occurs. - Continue Eliquis  5 mg twice daily - Continue diltiazem  120 mg daily - Encouraged use of MyChart for appointment management  Generalized anxiety disorder Anxiety is well-managed. - Continue escitalopram  10 mg daily - Continue alprazolam  0.25 mg at bedtime  Chronic kidney disease, stage 3a Kidney function is stable with recent lab work showing normal results. - Continue current management and monitoring  Hyperlipidemia Cholesterol levels are well-controlled with current medication. Recent lab work shows LDL at 33, HDL at 66, and triglycerides at 112. - Continue  rosuvastatin  5 mg daily  Recurrent urinary tract infection No recent urinary tract infections. Previous treatment with low-dose antibiotics was effective. - Continue nitrofurantoin  100 mg daily - Encouraged increased water intake and cranberry juice as needed  Constipation Intermittent constipation managed with Miralax  as needed. No current issues reported. - Continue Miralax  as needed  Osteoarthritis Joint stiffness and soreness, particularly in the fingers. No acute exacerbations reported. - Encouraged use of stress balls and finger exercises - Encouraged regular exercise to maintain joint flexibility  Allergic rhinitis No acute exacerbations reported. - Continue Flonase  for nasal congestion - Continue Claritin  daily - Continue Mucinex  as needed   Family/ staff Communication: Reviewed plan of care with patient and daughter verbalized understanding  Labs/tests ordered:  - CBC with Differential/Platelet - CMP with eGFR([facility]) - TSH - Lipid panel - - Urine Culture; Future - POC Urinalysis Dipstick  Next Appointment : Return in about 6 months (around 08/30/2024) for medical mangement of chronic issues., Fasting labs in 6 months prior to visit.   Spent 30 minutes of Face to face and non-face to face with patient  >50% time spent counseling; reviewing medical record; tests; labs; documentation and developing future plan of care.   Roxan JAYSON Plough, NP

## 2024-03-02 LAB — URINE CULTURE
MICRO NUMBER:: 17295635
SPECIMEN QUALITY:: ADEQUATE

## 2024-03-03 ENCOUNTER — Ambulatory Visit: Payer: Self-pay | Admitting: Family

## 2024-03-11 ENCOUNTER — Other Ambulatory Visit: Payer: Self-pay | Admitting: Family

## 2024-03-11 DIAGNOSIS — I480 Paroxysmal atrial fibrillation: Secondary | ICD-10-CM

## 2024-04-02 ENCOUNTER — Ambulatory Visit: Payer: Self-pay

## 2024-04-02 ENCOUNTER — Emergency Department (HOSPITAL_COMMUNITY)

## 2024-04-02 ENCOUNTER — Emergency Department (HOSPITAL_COMMUNITY)
Admission: EM | Admit: 2024-04-02 | Discharge: 2024-04-02 | Disposition: A | Discharge status: Home / Self Care | Source: Ambulatory Visit | Attending: Emergency Medicine | Admitting: Emergency Medicine

## 2024-04-02 DIAGNOSIS — S93402A Sprain of unspecified ligament of left ankle, initial encounter: Secondary | ICD-10-CM | POA: Diagnosis not present

## 2024-04-02 DIAGNOSIS — W19XXXA Unspecified fall, initial encounter: Secondary | ICD-10-CM | POA: Insufficient documentation

## 2024-04-02 DIAGNOSIS — E119 Type 2 diabetes mellitus without complications: Secondary | ICD-10-CM | POA: Diagnosis not present

## 2024-04-02 DIAGNOSIS — Z7901 Long term (current) use of anticoagulants: Secondary | ICD-10-CM | POA: Diagnosis not present

## 2024-04-02 DIAGNOSIS — S99912A Unspecified injury of left ankle, initial encounter: Secondary | ICD-10-CM | POA: Diagnosis present

## 2024-04-02 DIAGNOSIS — I4891 Unspecified atrial fibrillation: Secondary | ICD-10-CM | POA: Diagnosis not present

## 2024-04-02 NOTE — ED Provider Notes (Signed)
" [place] EMERGENCY DEPARTMENT AT [facility] Provider Note   CSN: [id number] Arrival date & time: 04/02/24  1112     Patient presents with: Ashley Lawson   Ashley Lawson is a 89 y.o. female. Fall  Patient is an 89 year old female presenting ED today for concerns for mechanical fall last night that was unwitnessed, with patient reported that she was bending over to pick something up off the floor where she lost balance, landing on her buttocks.  Was able to stand up on her own afterward and ambulate.  Did not hit head, did not lose consciousness.  Mainly complaining of left buttock pain and left ankle pain.  Notable does have previous medical history of Diabetes, anxiety, atrial fibrillation on Eliquis , polymyalgia rheumatica, IBS, recurrent urinary tract infections.  Reported to daughter who brought her to the emergency department for evaluation with her being on blood thinners.  Noting that she does have bruising to her right buttocks.  Denies headache, vision changes, chest pain, shortness of breath, abdominal pain, nausea, vomiting, numbness, weakness, tingling, decreased ROM to both upper and lower extremities.     Prior to Admission medications  Medication Sig Start Date End Date Taking? Authorizing Provider  acetaminophen  (TYLENOL ) 500 MG tablet Take 500 mg by mouth at bedtime.    [provider]  ALPRAZolam  (XANAX ) 0.25 MG tablet Take 0.25 mg by mouth at bedtime.    [provider]  Ascorbic Acid (VITAMIN C) 100 MG tablet Take 500 mg by mouth daily.    [provider]  diltiazem  (CARDIZEM  CD) 120 MG 24 hr capsule TAKE 1 CAPSULE BY MOUTH EVERY DAY 02/20/24   Verlin Lonni BIRCH, MD  ELIQUIS  5 MG TABS tablet TAKE 1 TABLET BY MOUTH TWICE A DAY 03/11/24   Ngetich, Dinah C, NP  escitalopram  (LEXAPRO ) 10 MG tablet TAKE 1 TABLET BY MOUTH EVERYDAY AT BEDTIME 10/28/23   Ngetich, Dinah C, NP  fluticasone  (FLONASE ) 50 MCG/ACT nasal spray INHALE 2 SPRAYS  IN EACH NOSTRIL DAILY X 1 WEEK, THEN 1-2 SPRAYS DAILY. USE LOWEST POSSIBLE DOSE AFTER 1 WEEK 03/24/23   Ngetich, Dinah C, NP  guaiFENesin  (MUCINEX ) 600 MG 12 hr tablet Take 600 mg by mouth daily as needed.    [provider]  loratadine  (CLARITIN ) 10 MG tablet TAKE 1 TABLET BY MOUTH EVERY DAY 01/28/24   Ngetich, Dinah C, NP  Multiple Vitamin (MULTIVITAMIN) tablet Take 1 tablet by mouth daily. Patient taking differently: Take 1 tablet by mouth as needed.    [provider]  nitrofurantoin , macrocrystal-monohydrate, (MACROBID ) 100 MG capsule Take 1 capsule (100 mg total) by mouth daily. 01/13/24   Ngetich, Dinah C, NP  polyethylene glycol powder (GLYCOLAX /MIRALAX ) 17 GM/SCOOP powder Take 17 g by mouth daily. Hold for loose stool 04/26/20   Ngetich, Dinah C, NP  PREMARIN vaginal cream Place 1 applicator vaginally as needed. 01/07/24   [provider]  Probiotic Product (ALIGN) 4 MG CAPS Take 1 capsule by mouth as needed.    [provider]  rosuvastatin  (CRESTOR ) 5 MG tablet TAKE 1 TABLET (5 MG TOTAL) BY MOUTH DAILY. 11/10/23   Ngetich, Dinah C, NP  XIIDRA 5 % SOLN Apply 1 drop to eye as needed (When daugther reminds her). 08/01/22   [provider]    Allergies: Alendronate sodium, Penicillins, Ciprofloxacin , Amoxicillin, and Metronidazole     Review of Systems  Musculoskeletal:  Positive for arthralgias.  All other systems reviewed and are negative.   Updated Vital  Signs BP (!) 149/95   Pulse 69   Temp (!) 97.5 F (36.4 C) (Oral)   Resp 18   SpO2 96%   Physical Exam Vitals and nursing note reviewed.  Constitutional:      General: She is not in acute distress.    Appearance: Normal appearance. She is not ill-appearing or diaphoretic.  HENT:     Head: Normocephalic and atraumatic.  Eyes:     General: No scleral icterus.       Right eye: No discharge.        Left eye: No discharge.     Extraocular Movements: Extraocular movements intact.      Conjunctiva/sclera: Conjunctivae normal.  Neck:     Comments: No tenderness to cervical, thoracic, lumbar spine midline or paraspinally. Cardiovascular:     Rate and Rhythm: Normal rate and regular rhythm.     Pulses: Normal pulses.     Heart sounds: Normal heart sounds. No murmur heard.    No friction rub. No gallop.  Pulmonary:     Effort: Pulmonary effort is normal. No respiratory distress.     Breath sounds: No stridor. No wheezing, rhonchi or rales.  Chest:     Chest wall: No tenderness.  Abdominal:     General: Abdomen is flat. There is no distension.     Palpations: Abdomen is soft.     Tenderness: There is no abdominal tenderness. There is no right CVA tenderness, left CVA tenderness, guarding or rebound.  Musculoskeletal:        General: Swelling and tenderness present. No deformity or signs of injury.     Cervical back: Normal range of motion. No rigidity or tenderness.     Right lower leg: No edema.     Left lower leg: No edema.     Comments: Mild tenderness and swelling to left lateral malleolus, noting the patient is neurovascularly intact with DP pulse 2+ bilaterally.  Has full ROM at ankles bilaterally, knees bilaterally, hips bilaterally, wrists, elbows, shoulders bilaterally.  No back pain/tenderness to palpation.  Skin:    General: Skin is warm and dry.     Findings: Bruising present. No erythema or lesion.     Comments: Interval does have bruising to right medial buttock.  No tenderness over coccyx or sacrum.  Neurological:     General: No focal deficit present.     Mental Status: She is alert and oriented to person, place, and time. Mental status is at baseline.     Sensory: No sensory deficit.     Motor: No weakness.  Psychiatric:        Mood and Affect: Mood normal.     (all labs ordered are listed, but only abnormal results are displayed) Labs Reviewed - No data to display  EKG: None  Radiology: DG Ankle Complete Left Result Date: 04/02/2024 CLINICAL  DATA:  Left ankle pain after fall yesterday EXAM: LEFT ANKLE COMPLETE - 3+ VIEW COMPARISON:  None Available. FINDINGS: There is no evidence of fracture, dislocation, or joint effusion. There is no evidence of arthropathy or other focal bone abnormality. Mild lateral soft tissue swelling is noted. IMPRESSION: No fracture or dislocation is noted. Mild lateral soft tissue swelling. Electronically Signed   By: Lynwood Landy Raddle M.D.   On: 04/02/2024 12:51    Procedures   Medications Ordered in the ED - No data to display  Medical Decision Making Amount and/or Complexity of Data Reviewed Radiology: ordered.   This patient is  a 89 year old female who presents to the ED for concern of mechanical fall last night, unwitnessed, did not hit head, did not lose consciousness, but is on Eliquis  for atrial fibrillation with no missed doses.  Notably has been able to ambulate after the incident, getting herself up off the floor.  Reported incident to daughter who brought her in for evaluation today with her being on blood thinners.  On physical exam, patient is in no acute distress, afebrile, alert and orient x 4, speaking in full sentences, nontachypneic, nontachycardic.  Notably does have some mild left lateral malleolus tenderness and swelling, with no bruising noted to left ankle.  Does have bruising to right buttocks, with patient mainly can pain of left buttock pain, with no pain over hips bilaterally.  Patient has been able to ambulate without difficulty and has normal range of motion at all joints.  With swelling over left ankle, x-ray was done which was negative for any acute fracture.  Suspecting likely ankle sprain.  Mild bruising but with no pain over coccyx, no pain over hip, and full range of motion, low suspicion for any fracture at this time.  Additionally that she has been ambulatory without difficulty today.  Will have her managed medically at home, Ace wrap provided to the left ankle, have her  follow-up with orthopedics if swelling and pain persists, return to the ED for new or worsening symptoms.  Patient vital signs have remained stable throughout the course of patient's time in the ED. Low suspicion for any other emergent pathology at this time. I believe this patient is safe to be discharged. Provided strict return to ER precautions. Patient expressed agreement and understanding of plan. All questions were answered.  Differential diagnoses prior to evaluation: The emergent differential diagnosis includes, but is not limited to, fracture, ligamentous injury, neurovascular injury, dislocation, malalignment. This is not an exhaustive differential.   Past Medical History / Co-morbidities / Social History: Diabetes, anxiety, atrial fibrillation on Eliquis , polymyalgia rheumatica, IBS, recurrent urinary tract infections  Additional history: Chart reviewed. Pertinent results include:   Last seen by PCP on 03/01/2024, noted to have had a 60-day low dose course of Macrobid  prescribed by urology.  Noted to have had improved symptoms since.  Lab Tests/Imaging studies: I personally interpreted labs/imaging and the pertinent results include:    X-ray of left ankle shows soft tissue swelling with no fractures. X-ray of hip with unilateral left shows no acute abnormality.  I agree with the radiologist interpretation.    Medications:  I have reviewed the patients home medicines and have made adjustments as needed.  Critical Interventions: None  Social Determinants of Health: None  Disposition: After consideration of the diagnostic results and the patients response to treatment, I feel that the patient would benefit from discharge and treatment as above.   emergency department workup does not suggest an emergent condition requiring admission or immediate intervention beyond what has been performed at this time. The plan is: Follow-up with PCP, follow-up orthopedics as needed, return to  ER for new or worsening symptoms. The patient is safe for discharge and has been instructed to return immediately for worsening symptoms, change in symptoms or any other concerns.   Final diagnoses:  Fall, initial encounter  Sprain of left ankle, unspecified ligament, initial encounter    ED Discharge Orders     None          Beola Terrall GORMAN DEVONNA 04/02/24 1605    Laurice Maude BROCKS, MD 04/02/24  2226 ° °"

## 2024-04-02 NOTE — Discharge Instructions (Addendum)
 You were seen today for left ankle sprain.  Your x-rays today were reassuring, showing no fractures.  However you likely did sprained a ligament at this time which we will wrap for you.  Continue to keep it elevated for the next couple days as well as wrap to help decrease swelling, providing ice as needed.  Additionally using Tylenol  for additional pain relief.  Recommend that you continue to follow-up with orthopedic surgery if swelling and pain persist.  Following up with PCP as needed.

## 2024-04-02 NOTE — Telephone Encounter (Signed)
 See triage notes from triage nurse as FYI

## 2024-04-02 NOTE — ED Notes (Signed)
 Shoes and socks removed, ice pack applied to left ankle.  Distal pulses present, patient has good range of motion of extremity.  No discoloration or deformity comparing it to right ankle.

## 2024-04-02 NOTE — Telephone Encounter (Signed)
 Left a detail voicemail for the patient daughter to share the response from Ngetich, Dinah C, NP to that she would like for the patient to schedule an appointment after the ED visit.  It is okay for E2C2 to share the information from the provider and to schedule an appointment for a hospital follow from ED visit.

## 2024-04-02 NOTE — ED Triage Notes (Signed)
 Fall last night - unwitnessed. Told daughter she lost her balance and sat down before falling. IS on blood thinners. Daughter noticed large bruising to right buttock. Patient states she did not hit her head. C/o left ankle pain and swelling.
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
## 2024-04-02 NOTE — Telephone Encounter (Signed)
 Noted.will follow up after ED visit.

## 2024-04-14 ENCOUNTER — Encounter: Payer: Self-pay | Admitting: Family

## 2024-04-14 ENCOUNTER — Ambulatory Visit: Admitting: Family

## 2024-04-14 VITALS — BP 120/76 | HR 80 | Temp 97.4°F | Ht 61.0 in | Wt 162.8 lb

## 2024-04-14 DIAGNOSIS — R2681 Unsteadiness on feet: Secondary | ICD-10-CM | POA: Diagnosis not present

## 2024-04-14 DIAGNOSIS — R296 Repeated falls: Secondary | ICD-10-CM

## 2024-04-14 DIAGNOSIS — M150 Primary generalized (osteo)arthritis: Secondary | ICD-10-CM | POA: Diagnosis not present

## 2024-04-21 ENCOUNTER — Other Ambulatory Visit: Payer: Self-pay | Admitting: Family

## 2024-04-21 DIAGNOSIS — F411 Generalized anxiety disorder: Secondary | ICD-10-CM

## 2024-04-25 NOTE — Progress Notes (Signed)
 "  Provider: Calder Oblinger FNP-C  Ashley Lawson, Ashley BROCKS, NP  Patient Care Team: [name], Ashley BROCKS, NP as PCP - General (Family Medicine) Verlin Lonni BIRCH, MD as PCP - Cardiology (Cardiology) Gaston Hamilton, MD (Urology) Shlomo Fallow, MD (Dermatology) Leslee Reusing, MD (Ophthalmology) Gaspar Kung, MD (Orthopedic Surgery) lenon  (Rheumatology) Joshua Blamer, MD as Consulting Physician (Dermatology) Avram Lupita BRAVO, MD as Consulting Physician (Gastroenterology) Ethyl Lonni BRAVO, MD (Inactive) as Consulting Physician (Otolaryngology)  Extended Emergency Contact Information Primary Emergency Contact: Ashley Lawson Address: [address]          [place] [address] [place] Home Phone: [phone] Mobile Phone: [phone] Relation: Daughter  Code Status: Full Code  Goals of care: Advanced Directive information    03/01/2024    1:48 PM  Advanced Directives  Does Patient Have a Medical Advance Directive? Yes  Type of Advance Directive Healthcare Power of Attorney  Does patient want to make changes to medical advance directive? No - Patient declined  Copy of Healthcare Power of Attorney in Chart? Yes - validated most recent copy scanned in chart (See row information)     Chief Complaint  Patient presents with   Follow-up    Patient presents today for hospital follow up was in ER on 04/02/2024 for a fall. Patient denies any pain at this time.    History of Present Illness   Ashley Lawson is an 89 year old female who presents for follow-up after a fall resulting in a sprained left ankle and bruising.  On New Year's Day, she experienced a fall at home while bending down to pick something up. She felt like she was going to fall forward, so she sat on the floor but still sustained a bruise on her right cheek and a sprained left ankle. Despite the fall, she managed to get up and continued with her planned activities, including playing cards with  neighbors.  She went to the emergency room the following day due to swelling and bruising. X-rays of her foot and hip were performed, revealing a sprained left ankle with no fractures. Her ankle was wrapped, and she was advised to use ice to reduce swelling.  Currently, she experiences no significant pain in her ankle, only occasional soreness. The bruise on her cheek has resolved. She uses a cane for stability, which she finds helpful for balance. She has not been very active outside the house, especially since before Christmas, and has been using a cane around the house.  She has been taking Tylenol  occasionally for pain, particularly at night, but not consistently throughout the day. She has not had any recent lab work done and did not have any during her ER visit.  Her caregiver has been unavailable due to medical issues, and her daughter is seeking temporary assistance. Her daughter and other family members have been providing support, including during weekends when her daughter works.   Past Medical History:  Diagnosis Date   Abdominal pain 12/2009   hospitalized   Allergic rhinitis    Anxiety    Bladder problem    Chronic constipation    Compression fx, lumbar spine (HCC)    DDD (degenerative disc disease), lumbar    Diverticulosis of colon    Dog bite(E906.0) 08/30/2011   GERD (gastroesophageal reflux disease)    Hepatic steatosis    pt denies   Hyperlipidemia    Hyperplastic colon polyp    IBS (irritable bowel syndrome)    constipation predominant  Internal hemorrhoids    Longstanding persistent atrial fibrillation (HCC)    Mitral regurgitation    Osteoporosis    dexa 2011 -2.7 hip nl spine   PMR (polymyalgia rheumatica) 08/30/2011   under rheum care  on low dose pred 5 mg    Renal disorder    Syncope    Tricuspid regurgitation    Past Surgical History:  Procedure Laterality Date   ABDOMINAL HYSTERECTOMY     with bladder tact and rectocele repair   BLADDER  SURGERY     Streched   CATARACT EXTRACTION  2010   both   COLONOSCOPY  2016 was most recent   CYSTO WITH HYDRODISTENSION N/A 10/05/2019   Procedure: CYSTOSCOPY/HYDRODISTENSION;  Surgeon: Gaston Hamilton, MD;  Location: [facility];  Service: Urology;  Laterality: N/A;   TONSILLECTOMY AND ADENOIDECTOMY      Allergies[1]  Outpatient Encounter Medications as of 04/14/2024  Medication Sig   acetaminophen  (TYLENOL ) 500 MG tablet Take 500 mg by mouth at bedtime.   ALPRAZolam  (XANAX ) 0.25 MG tablet Take 0.25 mg by mouth at bedtime.   Ascorbic Acid (VITAMIN C) 100 MG tablet Take 500 mg by mouth daily.   calcium  carbonate (OSCAL) 1500 (600 Ca) MG TABS tablet Take 600 mg of elemental calcium  by mouth daily.   diltiazem  (CARDIZEM  CD) 120 MG 24 hr capsule TAKE 1 CAPSULE BY MOUTH EVERY DAY   ELIQUIS  5 MG TABS tablet TAKE 1 TABLET BY MOUTH TWICE A DAY   fluticasone  (FLONASE ) 50 MCG/ACT nasal spray INHALE 2 SPRAYS IN EACH NOSTRIL DAILY X 1 WEEK, THEN 1-2 SPRAYS DAILY. USE LOWEST POSSIBLE DOSE AFTER 1 WEEK   guaiFENesin  (MUCINEX ) 600 MG 12 hr tablet Take 600 mg by mouth daily as needed.   loratadine  (CLARITIN ) 10 MG tablet TAKE 1 TABLET BY MOUTH EVERY DAY   Multiple Vitamin (MULTIVITAMIN) tablet Take 1 tablet by mouth daily.   nitrofurantoin , macrocrystal-monohydrate, (MACROBID ) 100 MG capsule Take 1 capsule (100 mg total) by mouth daily.   polyethylene glycol powder (GLYCOLAX /MIRALAX ) 17 GM/SCOOP powder Take 17 g by mouth daily. Hold for loose stool   PREMARIN vaginal cream Place 1 applicator vaginally as needed.   Probiotic Product (ALIGN) 4 MG CAPS Take 1 capsule by mouth as needed.   XIIDRA 5 % SOLN Apply 1 drop to eye as needed (When daugther reminds her).   [DISCONTINUED] escitalopram  (LEXAPRO ) 10 MG tablet TAKE 1 TABLET BY MOUTH EVERYDAY AT BEDTIME   [DISCONTINUED] rosuvastatin  (CRESTOR ) 5 MG tablet TAKE 1 TABLET (5 MG TOTAL) BY MOUTH DAILY.   No facility-administered encounter medications on file  as of 04/14/2024.    Review of Systems  Constitutional:  Negative for appetite change, chills, fatigue, fever and unexpected weight change.  HENT:  Negative for congestion, dental problem, ear discharge, ear pain, hearing loss, nosebleeds, postnasal drip, rhinorrhea, sinus pressure, sinus pain, sneezing, sore throat, tinnitus and trouble swallowing.   Eyes:  Negative for pain, discharge, redness, itching and visual disturbance.  Respiratory:  Negative for cough, chest tightness, shortness of breath and wheezing.   Cardiovascular:  Negative for chest pain, palpitations and leg swelling.  Gastrointestinal:  Negative for abdominal distention, abdominal pain, blood in stool, constipation, diarrhea, nausea and vomiting.  Endocrine: Negative for cold intolerance, heat intolerance, polydipsia, polyphagia and polyuria.  Genitourinary:  Negative for difficulty urinating, dysuria, flank pain, frequency and urgency.  Musculoskeletal:  Positive for arthralgias and gait problem. Negative for back pain, joint swelling, myalgias, neck pain and neck stiffness.  Skin:  Negative for color change, pallor and rash.  Neurological:  Negative for dizziness, syncope, speech difficulty, weakness, light-headedness, numbness and headaches.  Hematological:  Does not bruise/bleed easily.  Psychiatric/Behavioral:  Negative for agitation, behavioral problems, confusion, hallucinations, self-injury, sleep disturbance and suicidal ideas. The patient is not nervous/anxious.     Immunization History  Administered Date(s) Administered   Fluad Quad(high Dose 65+) 01/10/2020, 12/10/2021   Fluad Trivalent(High Dose 65+) 01/06/2023   INFLUENZA, HIGH DOSE SEASONAL PF 01/19/2013, 01/10/2014, 01/17/2015, 01/18/2016, 01/02/2017, 01/05/2018, 01/05/2019, 01/05/2019, 02/07/2021, 01/09/2024   Influenza Split 01/25/2011, 01/23/2012   Influenza Whole 12/31/2007   Influenza-Unspecified 01/02/2017   Moderna Sars-Covid-2 Vaccination  02/15/2022   PFIZER(Purple Top)SARS-COV-2 Vaccination 05/07/2019, 06/02/2019, 01/31/2020, 08/02/2020   Pfizer Covid-19 Vaccine Bivalent Booster 6yrs & up 02/19/2021   Pfizer(Comirnaty)Fall Seasonal Vaccine 12 years and older 01/12/2024   Pneumococcal Conjugate-13 02/23/2013   Pneumococcal Polysaccharide-23 04/01/2006, 04/01/2018   Td 04/01/2005   Td (Adult) 11/22/2016   Tdap 08/30/2011, 08/03/2021   Zoster Recombinant(Shingrix) 12/04/2016, 07/27/2017   Zoster, Live 04/02/2007   Pertinent  Health Maintenance Due  Topic Date Due   Mammogram  09/09/2022   Influenza Vaccine  Completed   Bone Density Scan  Completed      09/15/2023   12:24 PM 11/21/2023   11:01 AM 12/09/2023    9:27 AM 03/01/2024    1:47 PM 04/14/2024    1:03 PM  Fall Risk  Falls in the past year? 0 1 1 0 1  Was there an injury with Fall? 0  1  1  0  1  Fall Risk Category Calculator 0 3 2 0 2  Patient at Risk for Falls Due to  History of fall(s)  No Fall Risks History of fall(s)  Fall risk Follow up Falls evaluation completed   Falls evaluation completed Falls evaluation completed     Data saved with a previous flowsheet row definition   Functional Status Survey:    Vitals:   04/14/24 1306  BP: 120/76  Pulse: 80  Temp: (!) 97.4 F (36.3 C)  SpO2: 96%  Weight: 162 lb 12.8 oz (73.8 kg)  Height: 5' 1 (1.549 m)   Body mass index is 30.76 kg/m. Physical Exam  VITALS: T- 97.4, P- 80, BP- 120/76, SaO2- 96% MEASUREMENTS: Weight- 162. GENERAL: Alert, cooperative, well developed, no acute distress. HEENT: Normocephalic, normal oropharynx, moist mucous membranes, ears normal bilaterally, nose normal. CHEST: Clear to auscultation bilaterally, no wheezes, rhonchi, or crackles. CARDIOVASCULAR: Normal heart rate and rhythm, S1 and S2 normal without murmurs. ABDOMEN: Soft, non-tender, non-distended, without organomegaly, normal bowel sounds. EXTREMITIES: No cyanosis or edema. MUSCULOSKELETAL: Normal range of motion  in hips bilaterally. NEUROLOGICAL: Cranial nerves grossly intact, moves all extremities without gross motor or sensory deficit.  SKIN: No rash,no lesion or erythema   PSYCHIATRY/BEHAVIORAL: Mood stable   Labs reviewed: Recent Labs    08/22/23 0901 12/11/23 1949 02/25/24 0857  NA 139 134* 138  K 4.4 4.4 4.9  CL 104 100 102  CO2 27 23 27   GLUCOSE 108* 101* 98  BUN 14 15 16   CREATININE 0.87 1.00 0.87  CALCIUM  9.1 9.3 9.5   Recent Labs    08/22/23 0901 12/11/23 1949 02/25/24 0857  AST 18 23 20   ALT 12 13 11   ALKPHOS  --  57  --   BILITOT 0.8 0.7 0.9  PROT 6.8 6.1* 6.7  ALBUMIN  --  3.7  --    Recent Labs  08/22/23 0901 12/11/23 1949 02/25/24 0857  WBC 5.8 8.1 6.2  NEUTROABS 3,149 4.7 3,720  HGB 12.2 12.3 12.2  HCT 39.0 38.7 37.5  MCV 99.0 94.9 97.9  PLT 208 185 218   Lab Results  Component Value Date   TSH 4.78 (H) 02/25/2024   Lab Results  Component Value Date   HGBA1C 5.9 (H) 08/22/2023   Lab Results  Component Value Date   CHOL 117 02/25/2024   HDL 66 02/25/2024   LDLCALC 33 02/25/2024   LDLDIRECT 100.0 04/07/2019   TRIG 92 02/25/2024   CHOLHDL 1.8 02/25/2024    Significant Diagnostic Results in last 30 days:  DG Hip Unilat W or Wo Pelvis 2-3 Views Left Result Date: 04/02/2024 CLINICAL DATA:  Left hip pain after mechanical fall. Unwitnessed fall last night. EXAM: DG HIP (WITH OR WITHOUT PELVIS) 2-3V LEFT COMPARISON:  Pelvis radiograph 12/11/2023 FINDINGS: No acute fracture of the pelvis or left hip. No hip dislocation. Mild hip osteoarthritis with joint space narrowing and acetabular spurring. Pubic rami are intact. No pubic symphyseal or sacroiliac diastasis. Benign-appearing area of sclerosis in the left iliac bone, stable from prior. IMPRESSION: 1. No acute fracture or dislocation of the pelvis or left hip. 2. Mild osteoarthritis. Electronically Signed   By: Andrea Gasman M.D.   On: 04/02/2024 16:00   DG Ankle Complete Left Result Date:  04/02/2024 CLINICAL DATA:  Left ankle pain after fall yesterday EXAM: LEFT ANKLE COMPLETE - 3+ VIEW COMPARISON:  None Available. FINDINGS: There is no evidence of fracture, dislocation, or joint effusion. There is no evidence of arthropathy or other focal bone abnormality. Mild lateral soft tissue swelling is noted. IMPRESSION: No fracture or dislocation is noted. Mild lateral soft tissue swelling. Electronically Signed   By: Lynwood Landy Raddle M.D.   On: 04/02/2024 12:51    Assessment/Plan  Fall with left ankle sprain and contusion/unsteady gait  Recent fall on New Year's Day resulted in a left ankle sprain and contusion. X-rays confirmed no fracture or dislocation, with mild lateral soft tissue swelling. Bruising was exacerbated by Eliquis . Swelling has decreased with ice application and wrapping. No significant pain currently, only mild soreness. No acute fracture or dislocation of the pelvis or left hip on imaging. - Continue using ice and wrapping for the left ankle. - Use a cane for support to prevent further falls. - Consider using a reacher to avoid bending over and reduce dizziness. - Continue exercises to strengthen the ankle and legs. - Referred to physical therapy for further strengthening and mobility improvement.  Osteoarthritis of left hip Mild osteoarthritis noted on imaging of the left hip. No acute fracture or dislocation. Bruising from the fall has resolved. No significant pain reported. - Continue exercises to maintain joint mobility and strength. - Consider using compression stockings to manage any swelling.  General Health Maintenance Discussion on the importance of regular exercise and maintaining mobility to prevent falls and improve overall health. Emphasis on the need for a temporary caregiver due to the current caregiver's health issues. - Arranged for a temporary caregiver to assist with mobility and daily activities. - Encouraged regular exercise and use of a treadmill  if possible. - Ensured follow-up with physical therapy for mobility improvement.   Family/ staff Communication: Reviewed plan of care with patient verbalized understanding   Labs/tests ordered: None   Next Appointment: Return if symptoms worsen or fail to improve.  Total time: 20 minutes. Greater than 50% of total time spent doing patient  education regarding Osteoarthritis of left hip,Fall with left ankle sprain and contusion/unsteady gait and health maintenance including symptom/medication management.   Ashley Lawson Kajuan Guyton, NP    [1]  Allergies Allergen Reactions   Alendronate Sodium     upset stomach   Penicillins     Has patient had a PCN reaction causing immediate rash, facial/tongue/throat swelling, SOB or lightheadedness with hypotension: Yes Has patient had a PCN reaction causing severe rash involving mucus membranes or skin necrosis: No Has patient had a PCN reaction that required hospitalization: No Has patient had a PCN reaction occurring within the last 10 years: No If all of the above answers are NO, then may proceed with Cephalosporin use.    Ciprofloxacin      Had headaches; swelling of feet.    Amoxicillin Rash    Has patient had a PCN reaction causing immediate rash, facial/tongue/throat swelling, SOB or lightheadedness with hypotension: yes Has patient had a PCN reaction causing severe rash involving mucus membranes or skin necrosis: no Has patient had a PCN reaction that required hospitalization: no Has patient had PCN reaction within the last 10 years: no  If all of the above answers are NO, then may proceed with Cephalosporin use.  REACTION: unspecified   Metronidazole  Other (See Comments)    Pounding headaches patient says was bad.  With cipro  for diverticulitis rx. Other reaction(s): Other (See Comments) Pounding headaches patient says was bad.  With cipro  for diverticulitis rx.   "

## 2024-04-30 ENCOUNTER — Ambulatory Visit: Payer: Self-pay

## 2024-04-30 NOTE — Telephone Encounter (Signed)
 FYI Only or Action Required?: FYI only for provider: appointment scheduled on 05/03/24.  Patient was last seen in primary care on 04/14/2024 by Ngetich, Roxan BROCKS, NP.  Called Nurse Triage reporting Bleeding/Bruising.  Symptoms began several days ago.  Interventions attempted: Prescription medications: eliquis .  Symptoms are: unchanged.  Triage Disposition: See Physician Within 24 Hours  Patient/caregiver understands and will follow disposition?: Yes   Message from Graeme ORN sent at 04/30/2024 10:35 AM EST  Summary: Red and purple right leg   Reason for Triage: Red and purple right leg - spread from ankle up to knee.           Reason for Disposition  Taking Coumadin (warfarin) or other strong blood thinner, OR known bleeding disorder (e.g., thrombocytopenia)  (Exception: Small bruise at heparin  injection site.)  Answer Assessment - Initial Assessment Questions Pt's daughter contacted clinic with pt to report red/purple appearing bruise on pt's R leg from ankle to knee. She reports pt does take eliquis  but denies any injury, no wounds or trauma to the area. She states bruises are generally black and purple appearing. She states she first noticed bruising on Wednesday, 04/28/24. Pt denies any pain, no swelling, no itching. Pt is able to move leg/toes, no numbness or tingling, extremity is warm, no coolness or temp changes. Appointment scheduled for evaluation. Patient agrees with plan of care, and will call back if anything changes, or if symptoms worsen.       1. APPEARANCE of BRUISE: Describe the bruise.      Red/purple bruise on R leg from ankle to knee without known injury      3. NUMBER: How many bruises are there?      One   4. LOCATION: Where is the bruise located?      R lower leg   5. ONSET: How long ago did the bruise occur?      X 2 days ago; first noticed on Wednesday, 01/28  6. CAUSE: What do you think caused the bruise?     Unsure; pt is on eliquis   but daughter reports bruises are usually black and purple appearing   7. MEDICAL HISTORY: Do you have any medical problems that can cause easy bruising or bleeding? (e.g., leukemia, liver disease, recent chemotherapy)     Yes; pt takes eliquis    8. MEDICINES: Do you take any medicines which thin the blood such as: aspirin , apixaban , heparin , ibuprofen (NSAIDS), Plavix, or Coumadin?     Yes, eliquis  daily   9. OTHER SYMPTOMS: Do you have any other symptoms?  (e.g., weakness, dizziness, pain, fever, nosebleed, blood in urine/stool)     None  Protocols used: Bruises-A-AH

## 2024-04-30 NOTE — Telephone Encounter (Signed)
 Noted.

## 2024-04-30 NOTE — Telephone Encounter (Signed)
 See notes from triage nurse as FYI   Message sent to Ngetich, Dinah C, NP

## 2024-05-03 ENCOUNTER — Ambulatory Visit: Admitting: Family

## 2024-05-03 ENCOUNTER — Ambulatory Visit: Admitting: Physical Therapy

## 2024-05-19 ENCOUNTER — Ambulatory Visit: Admitting: Cardiovascular Disease

## 2024-05-26 ENCOUNTER — Ambulatory Visit

## 2024-06-30 ENCOUNTER — Ambulatory Visit: Admitting: Neurology

## 2024-08-30 ENCOUNTER — Other Ambulatory Visit

## 2024-09-01 ENCOUNTER — Ambulatory Visit: Admitting: Family

## 2025-01-12 ENCOUNTER — Ambulatory Visit: Payer: Self-pay | Admitting: Family
